# Patient Record
Sex: Male | Born: 1935 | Race: White | Hispanic: No | Marital: Married | Smoking: Former smoker
Health system: Southern US, Community
[De-identification: ages and names within clinical notes are randomized; demographics above are authoritative.]

## PROBLEM LIST (undated history)

## (undated) DIAGNOSIS — I10 Essential (primary) hypertension: Secondary | ICD-10-CM

## (undated) DIAGNOSIS — Z8546 Personal history of malignant neoplasm of prostate: Secondary | ICD-10-CM

## (undated) DIAGNOSIS — C61 Malignant neoplasm of prostate: Secondary | ICD-10-CM

## (undated) DIAGNOSIS — B029 Zoster without complications: Secondary | ICD-10-CM

## (undated) DIAGNOSIS — L608 Other nail disorders: Secondary | ICD-10-CM

## (undated) HISTORY — DX: Malignant neoplasm of prostate: C61

## (undated) HISTORY — DX: Essential (primary) hypertension: I10

## (undated) HISTORY — DX: Zoster without complications: B02.9

## (undated) HISTORY — PX: ABDOMINAL SURGERY: SHX537

## (undated) HISTORY — DX: Personal history of malignant neoplasm of prostate: Z85.46

---

## 1975-08-04 DIAGNOSIS — G4730 Sleep apnea, unspecified: Secondary | ICD-10-CM

## 1975-08-04 HISTORY — DX: Sleep apnea, unspecified: G47.30

## 2004-06-21 DIAGNOSIS — C61 Malignant neoplasm of prostate: Secondary | ICD-10-CM

## 2004-06-21 HISTORY — DX: Malignant neoplasm of prostate: C61

## 2004-08-03 HISTORY — PX: OTHER SURGICAL HISTORY: SHX169

## 2005-09-11 ENCOUNTER — Emergency Department: Payer: Self-pay | Admitting: Emergency Medicine

## 2005-09-11 ENCOUNTER — Other Ambulatory Visit: Payer: Self-pay

## 2006-04-03 ENCOUNTER — Encounter: Payer: Self-pay | Admitting: Family Medicine

## 2006-04-03 LAB — CONVERTED CEMR LAB: PSA: 0 ng/mL

## 2006-09-27 ENCOUNTER — Ambulatory Visit: Payer: Self-pay | Admitting: Family Medicine

## 2006-10-13 ENCOUNTER — Ambulatory Visit: Payer: Self-pay | Admitting: Pulmonary Disease

## 2007-06-13 ENCOUNTER — Ambulatory Visit: Payer: Self-pay | Admitting: Family Medicine

## 2007-06-13 LAB — CONVERTED CEMR LAB
Blood in Urine, dipstick: NEGATIVE
Nitrite: NEGATIVE
Specific Gravity, Urine: <1.005
WBC Urine, dipstick: NEGATIVE
pH: 6

## 2007-06-22 ENCOUNTER — Encounter: Payer: Self-pay | Admitting: Family Medicine

## 2007-06-22 DIAGNOSIS — C61 Malignant neoplasm of prostate: Secondary | ICD-10-CM | POA: Insufficient documentation

## 2007-06-22 DIAGNOSIS — E669 Obesity, unspecified: Secondary | ICD-10-CM | POA: Insufficient documentation

## 2007-06-22 DIAGNOSIS — K5730 Diverticulosis of large intestine without perforation or abscess without bleeding: Secondary | ICD-10-CM | POA: Insufficient documentation

## 2007-06-22 DIAGNOSIS — Z8719 Personal history of other diseases of the digestive system: Secondary | ICD-10-CM

## 2007-06-22 DIAGNOSIS — G4733 Obstructive sleep apnea (adult) (pediatric): Secondary | ICD-10-CM

## 2007-06-22 HISTORY — DX: Obstructive sleep apnea (adult) (pediatric): G47.33

## 2007-06-23 ENCOUNTER — Ambulatory Visit: Payer: Self-pay | Admitting: Family Medicine

## 2007-06-27 DIAGNOSIS — R7309 Other abnormal glucose: Secondary | ICD-10-CM

## 2007-06-27 DIAGNOSIS — E8881 Metabolic syndrome: Secondary | ICD-10-CM | POA: Insufficient documentation

## 2007-06-27 LAB — CONVERTED CEMR LAB
AST: 28 units/L (ref 0–37)
Basophils Absolute: 0 10*3/uL (ref 0.0–0.1)
Bilirubin, Direct: 0.1 mg/dL (ref 0.0–0.3)
CO2: 30 meq/L (ref 19–32)
Creatinine, Ser: 1.2 mg/dL (ref 0.4–1.5)
Direct LDL: 152.3 mg/dL
Eosinophils Relative: 1.6 % (ref 0.0–5.0)
Glucose, Bld: 109 mg/dL — ABNORMAL HIGH (ref 70–99)
HCT: 42.5 % (ref 39.0–52.0)
Hemoglobin: 14.6 g/dL (ref 13.0–17.0)
MCHC: 34.2 g/dL (ref 30.0–36.0)
Monocytes Absolute: 0.5 10*3/uL (ref 0.2–0.7)
Neutrophils Relative %: 70.4 % (ref 43.0–77.0)
Potassium: 4.7 meq/L (ref 3.5–5.1)
RDW: 12.9 % (ref 11.5–14.6)
Sodium: 143 meq/L (ref 135–145)
TSH: 3.25 microintl units/mL (ref 0.35–5.50)
Total Bilirubin: 0.9 mg/dL (ref 0.3–1.2)
Total CHOL/HDL Ratio: 4.3
Total Protein: 7.5 g/dL (ref 6.0–8.3)
WBC: 5.4 10*3/uL (ref 4.5–10.5)

## 2007-07-07 ENCOUNTER — Ambulatory Visit: Payer: Self-pay

## 2007-07-07 ENCOUNTER — Ambulatory Visit: Payer: Self-pay | Admitting: Cardiovascular Disease

## 2007-07-13 ENCOUNTER — Encounter: Payer: Self-pay | Admitting: Family Medicine

## 2007-07-13 ENCOUNTER — Ambulatory Visit: Payer: Self-pay

## 2007-07-25 ENCOUNTER — Ambulatory Visit: Payer: Self-pay | Admitting: Family Medicine

## 2007-07-25 DIAGNOSIS — F528 Other sexual dysfunction not due to a substance or known physiological condition: Secondary | ICD-10-CM

## 2007-10-26 ENCOUNTER — Ambulatory Visit: Payer: Self-pay | Admitting: Family Medicine

## 2007-11-03 ENCOUNTER — Ambulatory Visit: Payer: Self-pay | Admitting: Family Medicine

## 2007-11-09 LAB — CONVERTED CEMR LAB
BUN: 10 mg/dL (ref 6–23)
Calcium: 8.8 mg/dL (ref 8.4–10.5)
Creatinine, Ser: 1.1 mg/dL (ref 0.4–1.5)
GFR calc non Af Amer: 69 mL/min

## 2007-12-07 ENCOUNTER — Ambulatory Visit: Payer: Self-pay | Admitting: Family Medicine

## 2007-12-08 LAB — CONVERTED CEMR LAB
ALT: 23 units/L (ref 0–53)
AST: 23 units/L (ref 0–37)
Albumin: 4.6 g/dL (ref 3.5–5.2)
Alkaline Phosphatase: 48 units/L (ref 39–117)
BUN: 7 mg/dL (ref 6–23)
CO2: 31 meq/L (ref 19–32)
Chloride: 107 meq/L (ref 96–112)
Cholesterol: 217 mg/dL (ref 0–200)
Creatinine, Ser: 1.1 mg/dL (ref 0.4–1.5)
Direct LDL: 148.9 mg/dL
GFR calc non Af Amer: 69 mL/min
Potassium: 4.2 meq/L (ref 3.5–5.1)
Total Bilirubin: 1.1 mg/dL (ref 0.3–1.2)

## 2008-03-05 ENCOUNTER — Ambulatory Visit: Payer: Self-pay | Admitting: Family Medicine

## 2008-03-08 LAB — CONVERTED CEMR LAB
HDL: 44.8 mg/dL (ref >39.0)
LDL Cholesterol: 134 mg/dL — ABNORMAL HIGH (ref 0–99)
Total CHOL/HDL Ratio: 4.4
Triglycerides: 89 mg/dL (ref 0–149)

## 2008-09-11 ENCOUNTER — Ambulatory Visit: Payer: Self-pay | Admitting: Family Medicine

## 2008-09-14 LAB — CONVERTED CEMR LAB
Cholesterol: 199 mg/dL (ref 0–200)
VLDL: 18 mg/dL (ref 0–40)

## 2009-08-15 ENCOUNTER — Ambulatory Visit: Payer: Self-pay | Admitting: Family Medicine

## 2009-08-26 ENCOUNTER — Ambulatory Visit: Payer: Self-pay | Admitting: Family Medicine

## 2009-08-26 DIAGNOSIS — R5383 Other fatigue: Secondary | ICD-10-CM

## 2009-08-26 DIAGNOSIS — R5381 Other malaise: Secondary | ICD-10-CM

## 2009-08-29 LAB — CONVERTED CEMR LAB
AST: 23 units/L (ref 0–37)
Albumin: 4.1 g/dL (ref 3.5–5.2)
BUN: 16 mg/dL (ref 6–23)
Basophils Absolute: 0 10*3/uL (ref 0.0–0.1)
CO2: 30 meq/L (ref 19–32)
Cholesterol: 191 mg/dL (ref 0–200)
Eosinophils Absolute: 0.1 10*3/uL (ref 0.0–0.7)
Glucose, Bld: 112 mg/dL — ABNORMAL HIGH (ref 70–99)
HCT: 41.7 % (ref 39.0–52.0)
HDL: 44.7 mg/dL (ref >39.00)
Hemoglobin: 14 g/dL (ref 13.0–17.0)
Lymphs Abs: 1.1 10*3/uL (ref 0.7–4.0)
MCHC: 33.7 g/dL (ref 30.0–36.0)
MCV: 96.7 fL (ref 78.0–100.0)
Monocytes Absolute: 0.5 10*3/uL (ref 0.1–1.0)
Neutro Abs: 5.2 10*3/uL (ref 1.4–7.7)
PSA: 0.32 ng/mL (ref 0.10–4.00)
Platelets: 191 10*3/uL (ref 150.0–400.0)
Potassium: 4.7 meq/L (ref 3.5–5.1)
RDW: 13 % (ref 11.5–14.6)
Sodium: 139 meq/L (ref 135–145)
TSH: 4.69 microintl units/mL (ref 0.35–5.50)
Total Bilirubin: 0.9 mg/dL (ref 0.3–1.2)
VLDL: 19 mg/dL (ref 0.0–40.0)

## 2009-09-03 ENCOUNTER — Ambulatory Visit: Payer: Self-pay | Admitting: Cardiovascular Disease

## 2009-09-03 ENCOUNTER — Encounter (INDEPENDENT_AMBULATORY_CARE_PROVIDER_SITE_OTHER): Payer: Self-pay

## 2009-09-03 ENCOUNTER — Ambulatory Visit: Payer: Self-pay

## 2009-09-03 ENCOUNTER — Encounter: Payer: Self-pay | Admitting: Family Medicine

## 2009-09-03 ENCOUNTER — Ambulatory Visit (HOSPITAL_COMMUNITY): Admission: RE | Admit: 2009-09-03 | Discharge: 2009-09-03 | Payer: Self-pay | Admitting: Family Medicine

## 2009-09-05 ENCOUNTER — Ambulatory Visit: Payer: Self-pay | Admitting: Family Medicine

## 2009-09-06 DIAGNOSIS — I10 Essential (primary) hypertension: Secondary | ICD-10-CM | POA: Insufficient documentation

## 2009-11-11 ENCOUNTER — Ambulatory Visit: Payer: Self-pay | Admitting: Family Medicine

## 2009-11-11 DIAGNOSIS — R109 Unspecified abdominal pain: Secondary | ICD-10-CM | POA: Insufficient documentation

## 2010-01-20 ENCOUNTER — Ambulatory Visit: Payer: Self-pay | Admitting: Family Medicine
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
## 2010-09-02 NOTE — Assessment & Plan Note (Signed)
Summary: FOLLOW UP / LFW   Vital Signs:  Patient profile:   77 year old male Height:      68 inches Weight:      250.6 pounds BMI:     38.24 Temp:     98.0 degrees F oral Pulse rate:   72 / minute BP sitting:   150 / 70  (left arm) Cuff size:   large  Vitals Entered By: Benny Lennert CMA Duncan Dull) (January 20, 2010 10:49 AM)  History of Present Illness: Chief complaint 2 month follow up  HTN: 150/70  checks bp daily, still high  ROS: no fever, nausea, hurt back playing golf  GEN: WDWN, NAD, Non-toxic, A & O x 3 HEENT: Atraumatic, Normocephalic. Neck supple. No masses, No LAD. Ears and Nose: No external deformity. CV: RRR, No M/G/R. No JVD. No thrill. No extra heart sounds. NEURO: Normal gait.  PSYCH: Normally interactive. Conversant. Not depressed or anxious appearing.  Calm demeanor.    Allergies (verified): No Known Drug Allergies   Impression & Recommendations:  Problem # 1:  HYPERTENSION (ICD-401.9) Assessment Deteriorated  The following medications were removed from the medication list:    Amlodipine Besylate 10 Mg Tabs (Amlodipine besylate) .[name]... 1 by mouth daily His updated medication list for this problem includes:    Metoprolol Succinate 50 Mg Xr24h-tab (Metoprolol succinate) .[name]... 1 by mouth daily  BP today: 150/70 Prior BP: 116/80 (11/11/2009)  Prior 10 Yr Risk Heart Disease: Not enough information (07/25/2007)  Labs Reviewed: K+: 4.7 (08/26/2009) Creat: : 1.3 (08/26/2009)   Chol: 191 (08/26/2009)   HDL: 44.70 (08/26/2009)   LDL: 127 (08/26/2009)   TG: 95.0 (08/26/2009)  Orders: Prescription Created Electronically [id number])  Complete Medication List: 1)  Metoprolol Succinate 50 Mg Xr24h-tab (Metoprolol succinate) .[name].. 1 by mouth daily Prescriptions: METOPROLOL SUCCINATE 50 MG XR24H-TAB (METOPROLOL SUCCINATE) 1 by mouth daily  #30 x 11   Entered and Authorized by:   Hannah Beat MD   Signed by:   Hannah Beat MD on 01/20/2010   Method  used:   Electronically to        [facility]  [address] (retail)       [address]       [place], [place]  [address]       Ph: [phone] or [phone]       Fax: [phone]   RxID:   [id number]   Current Allergies (reviewed today): No known allergies

## 2010-09-02 NOTE — Miscellaneous (Signed)
Summary: IV For Definity Echo  Clinical Lists Changes     IV 0.9 % NACL 22G angiocath (R) AC for Definity Echo P. Undra Trembath,RN.

## 2010-12-16 NOTE — Procedures (Signed)
[facility]                              EXERCISE TREADMILL   VERLIN, UHER                       MRN:          [id number]  DATE:07/07/2007                            DOB:          [birth year]    REASON FOR REFERRAL:  Mr. Kloepfer is a 76 year old patient referred by  Dr. Ermalene Searing for cardiac risk factors for stress test.  He has  significant hypertension, hypercholesterolemia.   The patient exercised 9 minutes on a standard Bruce protocol.  Exercise  was stopped due to fatigue and shortness of breath.  Maximum heart rate  was 151.  Peak blood pressure was 207/87.  Resting EKG was normal.  With  stress, there was 1 mm of ST segment depression in the inferolateral  leads.   IMPRESSION:  Positive exercise treadmill test with electrocardiogram  changes and significant hypertension.   PLAN:  1. The patient will be referred for a stress Myoview.  2. Given his significant response in blood pressure to exercise, I      suspect that he would be better served with a medication such as      Hyzaar 100/12.5 or 100/25 rather than just a diuretic.  3. He will follow up with Dr. Ermalene Searing after his stress test to further      adjust his blood pressure medications.  4. He will be scheduled to see me if his stress Myoview is abnormal.     Theron Arista C. Eden Emms, MD, [facility]  Electronically Signed    PCN/MedQ  DD: 07/07/2007  DT: 07/07/2007  Job #: [id number]
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
## 2011-08-05 DIAGNOSIS — C61 Malignant neoplasm of prostate: Secondary | ICD-10-CM | POA: Diagnosis not present

## 2011-08-05 DIAGNOSIS — R350 Frequency of micturition: Secondary | ICD-10-CM | POA: Diagnosis not present

## 2011-08-05 DIAGNOSIS — R351 Nocturia: Secondary | ICD-10-CM | POA: Diagnosis not present

## 2011-08-05 DIAGNOSIS — E291 Testicular hypofunction: Secondary | ICD-10-CM | POA: Diagnosis not present

## 2011-08-10 DIAGNOSIS — M999 Biomechanical lesion, unspecified: Secondary | ICD-10-CM | POA: Diagnosis not present

## 2011-08-10 DIAGNOSIS — IMO0002 Reserved for concepts with insufficient information to code with codable children: Secondary | ICD-10-CM | POA: Diagnosis not present

## 2011-08-10 DIAGNOSIS — M5137 Other intervertebral disc degeneration, lumbosacral region: Secondary | ICD-10-CM | POA: Diagnosis not present

## 2011-09-07 DIAGNOSIS — M999 Biomechanical lesion, unspecified: Secondary | ICD-10-CM | POA: Diagnosis not present

## 2011-09-07 DIAGNOSIS — M5137 Other intervertebral disc degeneration, lumbosacral region: Secondary | ICD-10-CM | POA: Diagnosis not present

## 2011-09-07 DIAGNOSIS — IMO0002 Reserved for concepts with insufficient information to code with codable children: Secondary | ICD-10-CM | POA: Diagnosis not present

## 2011-10-05 DIAGNOSIS — IMO0002 Reserved for concepts with insufficient information to code with codable children: Secondary | ICD-10-CM | POA: Diagnosis not present

## 2011-10-05 DIAGNOSIS — M999 Biomechanical lesion, unspecified: Secondary | ICD-10-CM | POA: Diagnosis not present

## 2011-10-05 DIAGNOSIS — M5137 Other intervertebral disc degeneration, lumbosacral region: Secondary | ICD-10-CM | POA: Diagnosis not present

## 2011-10-09 DIAGNOSIS — H2510 Age-related nuclear cataract, unspecified eye: Secondary | ICD-10-CM | POA: Diagnosis not present

## 2011-11-04 DIAGNOSIS — IMO0002 Reserved for concepts with insufficient information to code with codable children: Secondary | ICD-10-CM | POA: Diagnosis not present

## 2011-11-04 DIAGNOSIS — M5137 Other intervertebral disc degeneration, lumbosacral region: Secondary | ICD-10-CM | POA: Diagnosis not present

## 2011-11-04 DIAGNOSIS — M999 Biomechanical lesion, unspecified: Secondary | ICD-10-CM | POA: Diagnosis not present

## 2011-12-02 DIAGNOSIS — M999 Biomechanical lesion, unspecified: Secondary | ICD-10-CM | POA: Diagnosis not present

## 2011-12-02 DIAGNOSIS — IMO0002 Reserved for concepts with insufficient information to code with codable children: Secondary | ICD-10-CM | POA: Diagnosis not present

## 2011-12-02 DIAGNOSIS — M5137 Other intervertebral disc degeneration, lumbosacral region: Secondary | ICD-10-CM | POA: Diagnosis not present

## 2011-12-18 DIAGNOSIS — C61 Malignant neoplasm of prostate: Secondary | ICD-10-CM | POA: Diagnosis not present

## 2011-12-18 DIAGNOSIS — E291 Testicular hypofunction: Secondary | ICD-10-CM | POA: Diagnosis not present

## 2011-12-18 DIAGNOSIS — R351 Nocturia: Secondary | ICD-10-CM | POA: Diagnosis not present

## 2012-01-06 DIAGNOSIS — M5137 Other intervertebral disc degeneration, lumbosacral region: Secondary | ICD-10-CM | POA: Diagnosis not present

## 2012-01-06 DIAGNOSIS — M999 Biomechanical lesion, unspecified: Secondary | ICD-10-CM | POA: Diagnosis not present

## 2012-01-06 DIAGNOSIS — IMO0002 Reserved for concepts with insufficient information to code with codable children: Secondary | ICD-10-CM | POA: Diagnosis not present

## 2012-02-10 DIAGNOSIS — M5137 Other intervertebral disc degeneration, lumbosacral region: Secondary | ICD-10-CM | POA: Diagnosis not present

## 2012-02-10 DIAGNOSIS — IMO0002 Reserved for concepts with insufficient information to code with codable children: Secondary | ICD-10-CM | POA: Diagnosis not present

## 2012-02-10 DIAGNOSIS — M999 Biomechanical lesion, unspecified: Secondary | ICD-10-CM | POA: Diagnosis not present

## 2012-03-08 ENCOUNTER — Emergency Department: Payer: Self-pay | Admitting: Emergency Medicine

## 2012-03-08 DIAGNOSIS — R079 Chest pain, unspecified: Secondary | ICD-10-CM | POA: Diagnosis not present

## 2012-03-08 DIAGNOSIS — Z8546 Personal history of malignant neoplasm of prostate: Secondary | ICD-10-CM | POA: Diagnosis not present

## 2012-03-08 LAB — CBC
HGB: 16.7 g/dL (ref 13.0–18.0)
MCH: 32.4 pg (ref 26.0–34.0)
MCHC: 33.9 g/dL (ref 32.0–36.0)
MCV: 96 fL (ref 80–100)
Platelet: 198 10*3/uL (ref 150–440)
RDW: 13.8 % (ref 11.5–14.5)

## 2012-03-08 LAB — COMPREHENSIVE METABOLIC PANEL
Albumin: 4.2 g/dL (ref 3.4–5.0)
Anion Gap: 9 (ref 7–16)
Calcium, Total: 9 mg/dL (ref 8.5–10.1)
Chloride: 103 mmol/L (ref 98–107)
Co2: 25 mmol/L (ref 21–32)
EGFR (African American): >60
Glucose: 97 mg/dL (ref 65–99)
Potassium: 5.8 mmol/L — ABNORMAL HIGH (ref 3.5–5.1)
SGOT(AST): 62 U/L — ABNORMAL HIGH (ref 15–37)

## 2012-03-09 ENCOUNTER — Telehealth: Payer: Self-pay

## 2012-03-09 DIAGNOSIS — M5137 Other intervertebral disc degeneration, lumbosacral region: Secondary | ICD-10-CM | POA: Diagnosis not present

## 2012-03-09 DIAGNOSIS — M999 Biomechanical lesion, unspecified: Secondary | ICD-10-CM | POA: Diagnosis not present

## 2012-03-09 DIAGNOSIS — IMO0002 Reserved for concepts with insufficient information to code with codable children: Secondary | ICD-10-CM | POA: Diagnosis not present

## 2012-03-09 NOTE — Telephone Encounter (Signed)
Message copied by Marcelle Overlie on Wed Mar 09, 2012  8:36 AM ------      Message from: Lorine Bears A      Created: Tue Mar 08, 2012  8:24 PM      Regarding: Follow up        He was in ER for chest pain and hypertension. Schedule him to see me early next week.

## 2012-03-09 NOTE — Telephone Encounter (Signed)
I called and spoke with pt. He says he is feeling better and is willing to schedule appt with Dr. Kirke Corin next Thursday at 0900 (03/17/12).

## 2012-03-17 ENCOUNTER — Encounter: Payer: Self-pay | Admitting: Cardiovascular Disease

## 2012-03-17 ENCOUNTER — Encounter: Payer: Self-pay | Admitting: *Deleted

## 2012-03-17 ENCOUNTER — Ambulatory Visit (INDEPENDENT_AMBULATORY_CARE_PROVIDER_SITE_OTHER): Payer: BC Managed Care – PPO | Admitting: Cardiovascular Disease

## 2012-03-17 VITALS — BP 168/84 | HR 59 | Ht 68.0 in | Wt 244.5 lb

## 2012-03-17 DIAGNOSIS — I10 Essential (primary) hypertension: Secondary | ICD-10-CM

## 2012-03-17 DIAGNOSIS — R0789 Other chest pain: Secondary | ICD-10-CM | POA: Diagnosis not present

## 2012-03-17 MED ORDER — CARVEDILOL 6.25 MG PO TABS: 6.2500 mg | ORAL_TABLET | Freq: Two times a day (BID) | ORAL | Status: DC

## 2012-03-17 MED ORDER — AMLODIPINE BESYLATE 5 MG PO TABS: 5.0000 mg | ORAL_TABLET | Freq: Every day | ORAL | Status: DC

## 2012-03-17 NOTE — Patient Instructions (Addendum)
Your physician has requested that you have en exercise stress myoview on august 20,2013   Your physician has recommended you make the following change in your medication:  Start Amlodipine 5 mg daily  Your physician recommends that you schedule a follow-up appointment 2 weeks after your myoview.

## 2012-03-18 ENCOUNTER — Encounter: Payer: Self-pay | Admitting: Cardiovascular Disease

## 2012-03-18 DIAGNOSIS — R0789 Other chest pain: Secondary | ICD-10-CM | POA: Insufficient documentation

## 2012-03-18 NOTE — Progress Notes (Signed)
HPI  This is a 78 year old male who is here today for cardiovascular evaluation. He was referred from the emergency room at [facility]. The patient has prolonged history of hypertension for which the patient has refused treatment in the past as he felt that the antihypertensive medications were making him feel tired. As a matter of fact he has not seen his primary care physician Dr. Patsy Lager in few years. He reports no previous cardiac history. He did have chest pain 3 years ago and was evaluated by a stress test and echocardiogram. Both of them were unremarkable. He never had cardiac catheterization in the past. He has been having recurrent episodes of substernal and right-sided chest discomfort radiating to his right arm mostly at rest and associated with numbness in the right arm. He complains of mild exertional dyspnea. He had one severe episode last week on Wednesday. He went to the emergency room at [facility]. He was noted to be hypertensive. His labs were unremarkable. CT scan showed no evidence of aortic dissection. He did not want to be admitted and was discharged home from the emergency room. He was discharged home on carvedilol 6.25 mg twice daily. He also has been taking aspirin 81 mg daily. He seems to be tolerating this blood pressure medication. He had few minor episodes of chest pain since then.  No Known Allergies   Current Outpatient Prescriptions on File Prior to Visit  Medication Sig Dispense Refill  . carvedilol (COREG) 6.25 MG tablet Take 1 tablet (6.25 mg total) by mouth 2 (two) times daily with a meal.  60 tablet  6  . Testosterone (AXIRON) 30 MG/ACT SOLN Place onto the skin.      [name] amLODipine (NORVASC) 5 MG tablet Take 1 tablet (5 mg total) by mouth daily.  30 tablet  6     Past Medical History  Diagnosis Date  . Hypertension   . H/O prostate cancer     was treated with radiation     History reviewed. No pertinent past surgical history.   Family History  Problem Relation  Age of Onset  . Heart disease Brother   . Heart attack Brother      History   Social History  . Marital Status: Married    Spouse Name: N/A    Number of Children: N/A  . Years of Education: N/A   Occupational History  . Not on file.   Social History Main Topics  . Smoking status: Former Smoker -- 1.0 packs/day for 15 years    Types: Cigarettes    Quit date: 09/14/1965  . Smokeless tobacco: Not on file  . Alcohol Use: 3.5 oz/week    7 drink(s) per week  . Drug Use: No  . Sexually Active: Not on file   Other Topics Concern  . Not on file   Social History Narrative  . No narrative on file     ROS Constitutional: Negative for fever, chills, diaphoresis, activity change, appetite change and fatigue.  HENT: Negative for hearing loss, nosebleeds, congestion, sore throat, facial swelling, drooling, trouble swallowing, neck pain, voice change, sinus pressure and tinnitus.  Eyes: Negative for photophobia, pain, discharge and visual disturbance.  Respiratory: Negative for apnea, cough and wheezing.  Cardiovascular: Negative for  palpitations and leg swelling.  Gastrointestinal: Negative for nausea, vomiting, abdominal pain, diarrhea, constipation, blood in stool and abdominal distention.  Genitourinary: Negative for dysuria, urgency, frequency, hematuria and decreased urine volume.  Musculoskeletal: Negative for myalgias, back pain, joint  swelling, arthralgias and gait problem.  Skin: Negative for color change, pallor, rash and wound.  Neurological: Negative for dizziness, tremors, seizures, syncope, speech difficulty, weakness, light-headedness, numbness and headaches.  Psychiatric/Behavioral: Negative for suicidal ideas, hallucinations, behavioral problems and agitation. The patient is not nervous/anxious.     PHYSICAL EXAM   BP 168/84  Pulse 59  Ht 5\' 8"  (1.727 m)  Wt 244 lb 8 oz (110.904 kg)  BMI 37.18 kg/m2 Constitutional: He is oriented to person, place, and time.  He appears well-developed and well-nourished. No distress.  HENT: No nasal discharge.  Head: Normocephalic and atraumatic.  Eyes: Pupils are equal and round. Right eye exhibits no discharge. Left eye exhibits no discharge.  Neck: Normal range of motion. Neck supple. No JVD present. No thyromegaly present.  Cardiovascular: Normal rate, regular rhythm, normal heart sounds and. Exam reveals no gallop and no friction rub. No murmur heard.  Pulmonary/Chest: Effort normal and breath sounds normal. No stridor. No respiratory distress. He has no wheezes. He has no rales. He exhibits no tenderness.  Abdominal: Soft. Bowel sounds are normal. He exhibits no distension. There is no tenderness. There is no rebound and no guarding.  Musculoskeletal: Normal range of motion. He exhibits no edema and no tenderness.  Neurological: He is alert and oriented to person, place, and time. Coordination normal.  Skin: Skin is warm and dry. No rash noted. He is not diaphoretic. No erythema. No pallor.  Psychiatric: He has a normal mood and affect. His behavior is normal. Judgment and thought content normal.       EKG: Sinus  Bradycardia  WITHIN NORMAL LIMITS   ASSESSMENT AND PLAN

## 2012-03-18 NOTE — Assessment & Plan Note (Signed)
I had a prolonged discussion with him about the importance of controlling his hypertension. He seems to be tolerating carvedilol but the dose cannot be increased as his heart rate is somewhat on the low side. Thus, I will add amlodipine 5 mg once daily.

## 2012-03-18 NOTE — Assessment & Plan Note (Addendum)
His chest pain is overall atypical but warrants further ischemic cardiac evaluation given his risk factors for coronary artery disease and uncontrolled hypertension. Thus, I recommend proceeding with a treadmill nuclear stress test for further evaluation.

## 2012-03-22 ENCOUNTER — Ambulatory Visit: Payer: Self-pay | Admitting: Cardiovascular Disease

## 2012-03-22 DIAGNOSIS — R079 Chest pain, unspecified: Secondary | ICD-10-CM

## 2012-03-31 ENCOUNTER — Other Ambulatory Visit: Payer: Self-pay | Admitting: Cardiovascular Disease

## 2012-03-31 DIAGNOSIS — R0789 Other chest pain: Secondary | ICD-10-CM

## 2012-04-06 DIAGNOSIS — M5137 Other intervertebral disc degeneration, lumbosacral region: Secondary | ICD-10-CM | POA: Diagnosis not present

## 2012-04-06 DIAGNOSIS — M999 Biomechanical lesion, unspecified: Secondary | ICD-10-CM | POA: Diagnosis not present

## 2012-04-06 DIAGNOSIS — IMO0002 Reserved for concepts with insufficient information to code with codable children: Secondary | ICD-10-CM | POA: Diagnosis not present

## 2012-04-08 ENCOUNTER — Encounter: Payer: Self-pay | Admitting: Cardiovascular Disease

## 2012-04-08 ENCOUNTER — Ambulatory Visit (INDEPENDENT_AMBULATORY_CARE_PROVIDER_SITE_OTHER): Payer: Medicare Other | Admitting: Cardiovascular Disease

## 2012-04-08 VITALS — BP 160/70 | HR 56 | Ht 68.0 in | Wt 242.8 lb

## 2012-04-08 DIAGNOSIS — R0789 Other chest pain: Secondary | ICD-10-CM

## 2012-04-08 DIAGNOSIS — I10 Essential (primary) hypertension: Secondary | ICD-10-CM

## 2012-04-08 NOTE — Assessment & Plan Note (Signed)
His blood pressure is improved but still elevated. I had a prolonged discussion with him about the importance of controlling his blood pressure. He is hesitant to add any other blood pressure medication and does not want to do that at this time. I don't want to increase his amlodipine due to lower extremity edema. The next step would be either to add an ACE inhibitor/ARB or thiazide diuretic. The patient wants to start an exercise program and try to lose weight. I asked him to monitor his blood pressure at home. He is to followup in 6 months or earlier if needed.

## 2012-04-08 NOTE — Patient Instructions (Addendum)
Continue same medications.  Monitor your blood pressure at least 3 times/ week.  Follow up in 6 months.

## 2012-04-08 NOTE — Assessment & Plan Note (Signed)
Likely noncardiac. Nuclear stress test was normal. He has symptoms suggestive of GERD but overall he feels better. He reports prolonged history of recurrent heartburn. I advised him to seek GI evaluation as EGD might be indicated due to his prolonged symptoms.

## 2012-04-08 NOTE — Progress Notes (Signed)
HPI  This is a 78 year old male who is here today for a followup visit.  The patient has prolonged history of hypertension for which the patient has refused treatment in the past as he felt that the antihypertensive medications were making him feel tired. He reports no previous cardiac history. He did have chest pain 3 years ago and was evaluated by a stress test and echocardiogram. Both of them were unremarkable. He never had cardiac catheterization in the past. He was seen for recurrent episodes of substernal and right-sided chest discomfort radiating to his right arm mostly at rest and associated with numbness in the right arm. He was evaluated at the emergency department at [facility] where He was noted to be hypertensive. His labs were unremarkable. CT scan showed no evidence of aortic dissection. After consultation with the ED physician, he was discharged home on carvedilol 6.25 mg twice daily. When he came to see me for the first time, his blood pressure was very elevated. I added amlodipine 5 mg once daily and scheduled him for a treadmill nuclear stress test which showed no evidence of ischemia or infarcts with normal ejection fraction. He had no ischemic ECG changes on treadmill stress test but was noted to have hypertensive response with systolic blood pressure close to 240 systolic. Overall he feels better. He is tolerating his medications. His chest pain is much less now. He thinks it might be related to GERD.  No Known Allergies   Current Outpatient Prescriptions on File Prior to Visit  Medication Sig Dispense Refill  . amLODipine (NORVASC) 5 MG tablet Take 1 tablet (5 mg total) by mouth daily.  30 tablet  6  . aspirin 81 MG tablet Take 81 mg by mouth daily.      . carvedilol (COREG) 6.25 MG tablet Take 1 tablet (6.25 mg total) by mouth 2 (two) times daily with a meal.  60 tablet  6  . Testosterone (AXIRON) 30 MG/ACT SOLN Place onto the skin.         Past Medical History  Diagnosis Date   . Hypertension   . H/O prostate cancer     was treated with radiation     History reviewed. No pertinent past surgical history.   Family History  Problem Relation Age of Onset  . Heart disease Brother   . Heart attack Brother      History   Social History  . Marital Status: Married    Spouse Name: N/A    Number of Children: N/A  . Years of Education: N/A   Occupational History  . Not on file.   Social History Main Topics  . Smoking status: Former Smoker -- 1.0 packs/day for 15 years    Types: Cigarettes    Quit date: 09/14/1965  . Smokeless tobacco: Not on file  . Alcohol Use: 3.5 oz/week    7 drink(s) per week  . Drug Use: No  . Sexually Active: Not on file   Other Topics Concern  . Not on file   Social History Narrative  . No narrative on file     PHYSICAL EXAM   BP 160/70  Pulse 56  Ht 5\' 8"  (1.727 m)  Wt 242 lb 12 oz (110.111 kg)  BMI 36.91 kg/m2 Constitutional: He is oriented to person, place, and time. He appears well-developed and well-nourished. No distress.  HENT: No nasal discharge.  Head: Normocephalic and atraumatic.  Eyes: Pupils are equal and round. Right eye exhibits no discharge.  Left eye exhibits no discharge.  Neck: Normal range of motion. Neck supple. No JVD present. No thyromegaly present.  Cardiovascular: Normal rate, regular rhythm, normal heart sounds and. Exam reveals no gallop and no friction rub. No murmur heard.  Pulmonary/Chest: Effort normal and breath sounds normal. No stridor. No respiratory distress. He has no wheezes. He has no rales. He exhibits no tenderness.  Abdominal: Soft. Bowel sounds are normal. He exhibits no distension. There is no tenderness. There is no rebound and no guarding.  Musculoskeletal: Normal range of motion. He exhibits +1 edema and no tenderness.  Neurological: He is alert and oriented to person, place, and time. Coordination normal.  Skin: Skin is warm and dry. No rash noted. He is not  diaphoretic. No erythema. No pallor.  Psychiatric: He has a normal mood and affect. His behavior is normal. Judgment and thought content normal.      ASSESSMENT AND PLAN

## 2012-05-04 DIAGNOSIS — M999 Biomechanical lesion, unspecified: Secondary | ICD-10-CM | POA: Diagnosis not present

## 2012-05-04 DIAGNOSIS — M5137 Other intervertebral disc degeneration, lumbosacral region: Secondary | ICD-10-CM | POA: Diagnosis not present

## 2012-05-04 DIAGNOSIS — IMO0002 Reserved for concepts with insufficient information to code with codable children: Secondary | ICD-10-CM | POA: Diagnosis not present

## 2012-05-19 DIAGNOSIS — R351 Nocturia: Secondary | ICD-10-CM | POA: Diagnosis not present

## 2012-05-19 DIAGNOSIS — C61 Malignant neoplasm of prostate: Secondary | ICD-10-CM | POA: Diagnosis not present

## 2012-05-19 DIAGNOSIS — E291 Testicular hypofunction: Secondary | ICD-10-CM | POA: Diagnosis not present

## 2012-06-08 DIAGNOSIS — M999 Biomechanical lesion, unspecified: Secondary | ICD-10-CM | POA: Diagnosis not present

## 2012-06-08 DIAGNOSIS — IMO0002 Reserved for concepts with insufficient information to code with codable children: Secondary | ICD-10-CM | POA: Diagnosis not present

## 2012-06-08 DIAGNOSIS — M5137 Other intervertebral disc degeneration, lumbosacral region: Secondary | ICD-10-CM | POA: Diagnosis not present

## 2012-07-06 DIAGNOSIS — IMO0002 Reserved for concepts with insufficient information to code with codable children: Secondary | ICD-10-CM | POA: Diagnosis not present

## 2012-07-06 DIAGNOSIS — M5137 Other intervertebral disc degeneration, lumbosacral region: Secondary | ICD-10-CM | POA: Diagnosis not present

## 2012-07-06 DIAGNOSIS — M999 Biomechanical lesion, unspecified: Secondary | ICD-10-CM | POA: Diagnosis not present

## 2012-08-10 DIAGNOSIS — M999 Biomechanical lesion, unspecified: Secondary | ICD-10-CM | POA: Diagnosis not present

## 2012-08-10 DIAGNOSIS — IMO0002 Reserved for concepts with insufficient information to code with codable children: Secondary | ICD-10-CM | POA: Diagnosis not present

## 2012-08-10 DIAGNOSIS — M5137 Other intervertebral disc degeneration, lumbosacral region: Secondary | ICD-10-CM | POA: Diagnosis not present

## 2012-09-07 DIAGNOSIS — M5137 Other intervertebral disc degeneration, lumbosacral region: Secondary | ICD-10-CM | POA: Diagnosis not present

## 2012-09-07 DIAGNOSIS — M999 Biomechanical lesion, unspecified: Secondary | ICD-10-CM | POA: Diagnosis not present

## 2012-09-07 DIAGNOSIS — IMO0002 Reserved for concepts with insufficient information to code with codable children: Secondary | ICD-10-CM | POA: Diagnosis not present

## 2012-10-05 DIAGNOSIS — IMO0002 Reserved for concepts with insufficient information to code with codable children: Secondary | ICD-10-CM | POA: Diagnosis not present

## 2012-10-05 DIAGNOSIS — M5137 Other intervertebral disc degeneration, lumbosacral region: Secondary | ICD-10-CM | POA: Diagnosis not present

## 2012-10-05 DIAGNOSIS — M999 Biomechanical lesion, unspecified: Secondary | ICD-10-CM | POA: Diagnosis not present

## 2012-10-18 ENCOUNTER — Other Ambulatory Visit: Payer: Self-pay | Admitting: *Deleted

## 2012-10-18 MED ORDER — CARVEDILOL 6.25 MG PO TABS: 6.2500 mg | ORAL_TABLET | Freq: Two times a day (BID) | ORAL | Status: DC

## 2012-10-27 DIAGNOSIS — C61 Malignant neoplasm of prostate: Secondary | ICD-10-CM | POA: Diagnosis not present

## 2012-10-27 DIAGNOSIS — E291 Testicular hypofunction: Secondary | ICD-10-CM | POA: Diagnosis not present

## 2012-10-27 DIAGNOSIS — R351 Nocturia: Secondary | ICD-10-CM | POA: Diagnosis not present

## 2012-11-02 DIAGNOSIS — M5137 Other intervertebral disc degeneration, lumbosacral region: Secondary | ICD-10-CM | POA: Diagnosis not present

## 2012-11-02 DIAGNOSIS — IMO0002 Reserved for concepts with insufficient information to code with codable children: Secondary | ICD-10-CM | POA: Diagnosis not present

## 2012-11-02 DIAGNOSIS — M999 Biomechanical lesion, unspecified: Secondary | ICD-10-CM | POA: Diagnosis not present

## 2012-11-24 ENCOUNTER — Ambulatory Visit: Payer: BC Managed Care – PPO | Admitting: Cardiovascular Disease

## 2012-11-30 DIAGNOSIS — IMO0002 Reserved for concepts with insufficient information to code with codable children: Secondary | ICD-10-CM | POA: Diagnosis not present

## 2012-11-30 DIAGNOSIS — M999 Biomechanical lesion, unspecified: Secondary | ICD-10-CM | POA: Diagnosis not present

## 2012-11-30 DIAGNOSIS — M5137 Other intervertebral disc degeneration, lumbosacral region: Secondary | ICD-10-CM | POA: Diagnosis not present

## 2012-12-15 ENCOUNTER — Ambulatory Visit (INDEPENDENT_AMBULATORY_CARE_PROVIDER_SITE_OTHER): Payer: BC Managed Care – PPO | Admitting: Cardiovascular Disease

## 2012-12-15 ENCOUNTER — Encounter: Payer: Self-pay | Admitting: Cardiovascular Disease

## 2012-12-15 VITALS — BP 162/88 | HR 52 | Ht 68.0 in | Wt 235.8 lb

## 2012-12-15 DIAGNOSIS — I10 Essential (primary) hypertension: Secondary | ICD-10-CM

## 2012-12-15 DIAGNOSIS — R0789 Other chest pain: Secondary | ICD-10-CM

## 2012-12-15 MED ORDER — AMLODIPINE BESYLATE 5 MG PO TABS: 5.0000 mg | ORAL_TABLET | Freq: Every day | ORAL | Status: DC

## 2012-12-15 MED ORDER — CARVEDILOL 6.25 MG PO TABS: 6.2500 mg | ORAL_TABLET | Freq: Two times a day (BID) | ORAL | Status: DC

## 2012-12-15 NOTE — Assessment & Plan Note (Signed)
I again had a prolonged discussion with him about the importance of taking this seriously. We discussed the importance of taking medications as prescribed. His heart rate seems to be similar to how it has been doing previous visits. Slightly he is taking carvedilol and not Amlodipine.  I gave him a new prescription in both and asked him to do both medications. He will continue to monitor his blood pressure and heart rate at home. He is to notify us if the heart rate is below 50 beats per minute.

## 2012-12-15 NOTE — Assessment & Plan Note (Signed)
No recurrent symptoms and negative ischemic workup so far.

## 2012-12-15 NOTE — Progress Notes (Signed)
HPI  This is a 79 year old male who is here today for a followup visit.  The patient has prolonged history of hypertension with poor adherence to medical therapy. He did have chest pain few years ago and was evaluated by a stress test and echocardiogram. Both of them were unremarkable. He never had cardiac catheterization in the past. He was seen last year for recurrent episodes of substernal and right-sided chest discomfort radiating to his right arm mostly at rest and associated with numbness in the right arm. He was evaluated at the emergency department at [facility] where He was noted to be hypertensive. His labs were unremarkable. CT scan showed no evidence of aortic dissection. He has been treated with carvedilol and amlodipine for hypertension with improvement. He has been trying to exercise more and lost some weight. It seems that he is only taking one antihypertensive medication but it's not clear whether it's amlodipine (because he makes the 2 medications together. He denies any chest pain, dyspnea or palpitations.  No Known Allergies   Current Outpatient Prescriptions on File Prior to Visit  Medication Sig Dispense Refill  . Omega-3 Fatty Acids (FISH OIL) 1200 MG CAPS Take 1,200 mg by mouth daily.      . Testosterone (AXIRON) 30 MG/ACT SOLN Place onto the skin.      [name] aspirin 81 MG tablet Take 81 mg by mouth daily.       No current facility-administered medications on file prior to visit.     Past Medical History  Diagnosis Date  . Hypertension   . H/O prostate cancer     was treated with radiation     History reviewed. No pertinent past surgical history.   Family History  Problem Relation Age of Onset  . Heart disease Brother   . Heart attack Brother      History   Social History  . Marital Status: Married    Spouse Name: N/A    Number of Children: N/A  . Years of Education: N/A   Occupational History  . Not on file.   Social History Main Topics  . Smoking  status: Former Smoker -- 1.00 packs/day for 15 years    Types: Cigarettes    Quit date: 09/14/1965  . Smokeless tobacco: Not on file  . Alcohol Use: 3.5 oz/week    7 drink(s) per week     Comment: daily  . Drug Use: No  . Sexually Active: Not on file   Other Topics Concern  . Not on file   Social History Narrative  . No narrative on file     PHYSICAL EXAM   BP 162/88  Pulse 52  Ht 5\' 8"  (1.727 m)  Wt 235 lb 12 oz (106.935 kg)  BMI 35.85 kg/m2 Constitutional: He is oriented to person, place, and time. He appears well-developed and well-nourished. No distress.  HENT: No nasal discharge.  Head: Normocephalic and atraumatic.  Eyes: Pupils are equal and round. Right eye exhibits no discharge. Left eye exhibits no discharge.  Neck: Normal range of motion. Neck supple. No JVD present. No thyromegaly present.  Cardiovascular: Normal rate, regular rhythm, normal heart sounds and. Exam reveals no gallop and no friction rub. No murmur heard.  Pulmonary/Chest: Effort normal and breath sounds normal. No stridor. No respiratory distress. He has no wheezes. He has no rales. He exhibits no tenderness.  Abdominal: Soft. Bowel sounds are normal. He exhibits no distension. There is no tenderness. There is no rebound and  no guarding.  Musculoskeletal: Normal range of motion. He exhibits +1 edema and no tenderness.  Neurological: He is alert and oriented to person, place, and time. Coordination normal.  Skin: Skin is warm and dry. No rash noted. He is not diaphoretic. No erythema. No pallor.  Psychiatric: He has a normal mood and affect. His behavior is normal. Judgment and thought content normal.    EKG: Sinus  Bradycardia  Low voltage -possible pulmonary disease.   ABNORMAL   ASSESSMENT AND PLAN

## 2012-12-15 NOTE — Patient Instructions (Addendum)
Resume Amlodipine 5 mg once daily and Carvedilol 6.25 mg twice daily.  Follow up in 6 months.

## 2012-12-28 DIAGNOSIS — M5137 Other intervertebral disc degeneration, lumbosacral region: Secondary | ICD-10-CM | POA: Diagnosis not present

## 2012-12-28 DIAGNOSIS — IMO0002 Reserved for concepts with insufficient information to code with codable children: Secondary | ICD-10-CM | POA: Diagnosis not present

## 2012-12-28 DIAGNOSIS — M999 Biomechanical lesion, unspecified: Secondary | ICD-10-CM | POA: Diagnosis not present

## 2013-01-05 DIAGNOSIS — IMO0002 Reserved for concepts with insufficient information to code with codable children: Secondary | ICD-10-CM | POA: Diagnosis not present

## 2013-01-05 DIAGNOSIS — M5137 Other intervertebral disc degeneration, lumbosacral region: Secondary | ICD-10-CM | POA: Diagnosis not present

## 2013-01-05 DIAGNOSIS — M999 Biomechanical lesion, unspecified: Secondary | ICD-10-CM | POA: Diagnosis not present

## 2013-01-06 DIAGNOSIS — M999 Biomechanical lesion, unspecified: Secondary | ICD-10-CM | POA: Diagnosis not present

## 2013-01-06 DIAGNOSIS — M5137 Other intervertebral disc degeneration, lumbosacral region: Secondary | ICD-10-CM | POA: Diagnosis not present

## 2013-01-06 DIAGNOSIS — IMO0002 Reserved for concepts with insufficient information to code with codable children: Secondary | ICD-10-CM | POA: Diagnosis not present

## 2013-01-09 DIAGNOSIS — M5137 Other intervertebral disc degeneration, lumbosacral region: Secondary | ICD-10-CM | POA: Diagnosis not present

## 2013-01-09 DIAGNOSIS — IMO0002 Reserved for concepts with insufficient information to code with codable children: Secondary | ICD-10-CM | POA: Diagnosis not present

## 2013-01-09 DIAGNOSIS — M999 Biomechanical lesion, unspecified: Secondary | ICD-10-CM | POA: Diagnosis not present

## 2013-01-25 DIAGNOSIS — IMO0002 Reserved for concepts with insufficient information to code with codable children: Secondary | ICD-10-CM | POA: Diagnosis not present

## 2013-01-25 DIAGNOSIS — M5137 Other intervertebral disc degeneration, lumbosacral region: Secondary | ICD-10-CM | POA: Diagnosis not present

## 2013-01-25 DIAGNOSIS — M999 Biomechanical lesion, unspecified: Secondary | ICD-10-CM | POA: Diagnosis not present

## 2013-03-01 DIAGNOSIS — M5137 Other intervertebral disc degeneration, lumbosacral region: Secondary | ICD-10-CM | POA: Diagnosis not present

## 2013-03-01 DIAGNOSIS — M999 Biomechanical lesion, unspecified: Secondary | ICD-10-CM | POA: Diagnosis not present

## 2013-03-01 DIAGNOSIS — M5412 Radiculopathy, cervical region: Secondary | ICD-10-CM | POA: Diagnosis not present

## 2013-03-01 DIAGNOSIS — M9981 Other biomechanical lesions of cervical region: Secondary | ICD-10-CM | POA: Diagnosis not present

## 2013-03-27 DIAGNOSIS — M5137 Other intervertebral disc degeneration, lumbosacral region: Secondary | ICD-10-CM | POA: Diagnosis not present

## 2013-03-27 DIAGNOSIS — M9981 Other biomechanical lesions of cervical region: Secondary | ICD-10-CM | POA: Diagnosis not present

## 2013-03-27 DIAGNOSIS — M5412 Radiculopathy, cervical region: Secondary | ICD-10-CM | POA: Diagnosis not present

## 2013-03-27 DIAGNOSIS — M999 Biomechanical lesion, unspecified: Secondary | ICD-10-CM | POA: Diagnosis not present

## 2013-04-24 DIAGNOSIS — M5137 Other intervertebral disc degeneration, lumbosacral region: Secondary | ICD-10-CM | POA: Diagnosis not present

## 2013-04-24 DIAGNOSIS — M5412 Radiculopathy, cervical region: Secondary | ICD-10-CM | POA: Diagnosis not present

## 2013-04-24 DIAGNOSIS — M999 Biomechanical lesion, unspecified: Secondary | ICD-10-CM | POA: Diagnosis not present

## 2013-04-24 DIAGNOSIS — M9981 Other biomechanical lesions of cervical region: Secondary | ICD-10-CM | POA: Diagnosis not present

## 2013-05-16 DIAGNOSIS — E291 Testicular hypofunction: Secondary | ICD-10-CM | POA: Diagnosis not present

## 2013-05-16 DIAGNOSIS — C61 Malignant neoplasm of prostate: Secondary | ICD-10-CM | POA: Diagnosis not present

## 2013-05-16 DIAGNOSIS — R351 Nocturia: Secondary | ICD-10-CM | POA: Diagnosis not present

## 2013-05-19 DIAGNOSIS — C61 Malignant neoplasm of prostate: Secondary | ICD-10-CM | POA: Diagnosis not present

## 2013-05-19 DIAGNOSIS — E291 Testicular hypofunction: Secondary | ICD-10-CM | POA: Diagnosis not present

## 2013-06-20 ENCOUNTER — Ambulatory Visit (INDEPENDENT_AMBULATORY_CARE_PROVIDER_SITE_OTHER): Payer: BC Managed Care – PPO | Admitting: Cardiovascular Disease

## 2013-06-20 ENCOUNTER — Encounter: Payer: Self-pay | Admitting: Cardiovascular Disease

## 2013-06-20 VITALS — BP 158/72 | HR 57 | Ht 68.0 in | Wt 231.5 lb

## 2013-06-20 DIAGNOSIS — R0789 Other chest pain: Secondary | ICD-10-CM

## 2013-06-20 DIAGNOSIS — I10 Essential (primary) hypertension: Secondary | ICD-10-CM

## 2013-06-20 MED ORDER — CARVEDILOL 6.25 MG PO TABS: 6.2500 mg | ORAL_TABLET | Freq: Two times a day (BID) | ORAL | Status: DC

## 2013-06-20 NOTE — Assessment & Plan Note (Signed)
Blood pressure is still mildly elevated. I think the next step is to add a small dose thiazide diuretic especially with his mild lower extremity edema. I discussed this with him and he prefers not to add a medication. He is very resistant to any changes. He wants to continue exercise and weight loss for now. I will reevaluate him in 6 months.

## 2013-06-20 NOTE — Patient Instructions (Signed)
Continue same medications.   Your physician wants you to follow-up in: 6 months.  You will receive a reminder letter in the mail two months in advance. If you don't receive a letter, please call our office to schedule the follow-up appointment.  

## 2013-06-20 NOTE — Progress Notes (Signed)
HPI  This is a 79 year old male who is here today for a followup visit.  The patient has prolonged history of hypertension with poor adherence to medical therapy. He did have chest pain few years ago and was evaluated by a stress test and echocardiogram. Both of them were unremarkable. He never had cardiac catheterization in the past. He was seen in 2013 for recurrent episodes of substernal and right-sided chest discomfort radiating to his right arm mostly at rest and associated with numbness in the right arm. He was evaluated at the emergency department at [facility] where He was noted to be hypertensive. His labs were unremarkable. CT scan showed no evidence of aortic dissection.  Treadmill nuclear stress test in August of 2013 showed no evidence of ischemia with normal ejection fraction. He was treated with carvedilol and amlodipine for hypertension with gradual improvement. Home blood pressure is ranging form 145/70-158/76. He continues to exercise regularly at the gym. Heart rate goes up to 105 with exercise with no chest pain or dyspnea.  No Known Allergies   Current Outpatient Prescriptions on File Prior to Visit  Medication Sig Dispense Refill  . amLODipine (NORVASC) 5 MG tablet Take 1 tablet (5 mg total) by mouth daily.  30 tablet  6  . aspirin 81 MG tablet Take 81 mg by mouth daily.      . carvedilol (COREG) 6.25 MG tablet Take 1 tablet (6.25 mg total) by mouth 2 (two) times daily.  60 tablet  6  . CIALIS 5 MG tablet Take 5 mg by mouth daily as needed.       . Omega-3 Fatty Acids (FISH OIL) 1200 MG CAPS Take 1,200 mg by mouth daily.      . Testosterone (AXIRON) 30 MG/ACT SOLN Place onto the skin.       No current facility-administered medications on file prior to visit.     Past Medical History  Diagnosis Date  . Hypertension   . H/O prostate cancer     was treated with radiation     History reviewed. No pertinent past surgical history.   Family History  Problem Relation  Age of Onset  . Heart disease Brother   . Heart attack Brother      History   Social History  . Marital Status: Married    Spouse Name: N/A    Number of Children: N/A  . Years of Education: N/A   Occupational History  . Not on file.   Social History Main Topics  . Smoking status: Former Smoker -- 1.00 packs/day for 15 years    Types: Cigarettes    Quit date: 09/14/1965  . Smokeless tobacco: Not on file  . Alcohol Use: 3.5 oz/week    7 drink(s) per week     Comment: daily  . Drug Use: No  . Sexual Activity: Not on file   Other Topics Concern  . Not on file   Social History Narrative  . No narrative on file     PHYSICAL EXAM   BP 158/72  Pulse 57  Ht 5\' 8"  (1.727 m)  Wt 231 lb 8 oz (105.008 kg)  BMI 35.21 kg/m2 Constitutional: He is oriented to person, place, and time. He appears well-developed and well-nourished. No distress.  HENT: No nasal discharge.  Head: Normocephalic and atraumatic.  Eyes: Pupils are equal and round. Right eye exhibits no discharge. Left eye exhibits no discharge.  Neck: Normal range of motion. Neck supple. No JVD present.  No thyromegaly present.  Cardiovascular: Normal rate, regular rhythm, normal heart sounds and. Exam reveals no gallop and no friction rub. No murmur heard.  Pulmonary/Chest: Effort normal and breath sounds normal. No stridor. No respiratory distress. He has no wheezes. He has no rales. He exhibits no tenderness.  Abdominal: Soft. Bowel sounds are normal. He exhibits no distension. There is no tenderness. There is no rebound and no guarding.  Musculoskeletal: Normal range of motion. He exhibits +1 edema and no tenderness.  Neurological: He is alert and oriented to person, place, and time. Coordination normal.  Skin: Skin is warm and dry. No rash noted. He is not diaphoretic. No erythema. No pallor.  Psychiatric: He has a normal mood and affect. His behavior is normal. Judgment and thought content normal.    EKG: Sinus   Bradycardia  Low voltage in precordial leads.   -Nonspecific ST depression   +   T-abnormality.   ABNORMAL    ASSESSMENT AND PLAN

## 2013-06-20 NOTE — Assessment & Plan Note (Signed)
No recurrent chest pain. Stress test last year showed no evidence of ischemia.

## 2013-07-06 ENCOUNTER — Other Ambulatory Visit: Payer: Self-pay | Admitting: Cardiovascular Disease

## 2013-07-06 ENCOUNTER — Other Ambulatory Visit: Payer: Self-pay | Admitting: *Deleted

## 2013-07-06 MED ORDER — AMLODIPINE BESYLATE 5 MG PO TABS: 5.0000 mg | ORAL_TABLET | Freq: Every day | ORAL | Status: DC

## 2013-07-06 NOTE — Telephone Encounter (Signed)
Requested Prescriptions   Signed Prescriptions Disp Refills  . amLODipine (NORVASC) 5 MG tablet 30 tablet 3    Sig: Take 1 tablet (5 mg total) by mouth daily.    Authorizing Provider: ARIDA, MUHAMMAD A    Ordering User: LOPEZ, MARINA C    

## 2013-08-14 DIAGNOSIS — M999 Biomechanical lesion, unspecified: Secondary | ICD-10-CM | POA: Diagnosis not present

## 2013-08-14 DIAGNOSIS — M9981 Other biomechanical lesions of cervical region: Secondary | ICD-10-CM | POA: Diagnosis not present

## 2013-08-14 DIAGNOSIS — M5412 Radiculopathy, cervical region: Secondary | ICD-10-CM | POA: Diagnosis not present

## 2013-08-14 DIAGNOSIS — M5137 Other intervertebral disc degeneration, lumbosacral region: Secondary | ICD-10-CM | POA: Diagnosis not present

## 2013-09-11 DIAGNOSIS — M5412 Radiculopathy, cervical region: Secondary | ICD-10-CM | POA: Diagnosis not present

## 2013-09-11 DIAGNOSIS — M9981 Other biomechanical lesions of cervical region: Secondary | ICD-10-CM | POA: Diagnosis not present

## 2013-09-11 DIAGNOSIS — M999 Biomechanical lesion, unspecified: Secondary | ICD-10-CM | POA: Diagnosis not present

## 2013-09-11 DIAGNOSIS — M5137 Other intervertebral disc degeneration, lumbosacral region: Secondary | ICD-10-CM | POA: Diagnosis not present

## 2013-10-09 DIAGNOSIS — M5137 Other intervertebral disc degeneration, lumbosacral region: Secondary | ICD-10-CM | POA: Diagnosis not present

## 2013-10-09 DIAGNOSIS — M9981 Other biomechanical lesions of cervical region: Secondary | ICD-10-CM | POA: Diagnosis not present

## 2013-10-09 DIAGNOSIS — M999 Biomechanical lesion, unspecified: Secondary | ICD-10-CM | POA: Diagnosis not present

## 2013-10-09 DIAGNOSIS — M5412 Radiculopathy, cervical region: Secondary | ICD-10-CM | POA: Diagnosis not present

## 2013-11-06 DIAGNOSIS — M5412 Radiculopathy, cervical region: Secondary | ICD-10-CM | POA: Diagnosis not present

## 2013-11-06 DIAGNOSIS — M999 Biomechanical lesion, unspecified: Secondary | ICD-10-CM | POA: Diagnosis not present

## 2013-11-06 DIAGNOSIS — M9981 Other biomechanical lesions of cervical region: Secondary | ICD-10-CM | POA: Diagnosis not present

## 2013-11-06 DIAGNOSIS — M5137 Other intervertebral disc degeneration, lumbosacral region: Secondary | ICD-10-CM | POA: Diagnosis not present

## 2013-11-21 DIAGNOSIS — E291 Testicular hypofunction: Secondary | ICD-10-CM | POA: Diagnosis not present

## 2013-11-21 DIAGNOSIS — C61 Malignant neoplasm of prostate: Secondary | ICD-10-CM | POA: Diagnosis not present

## 2013-11-27 DIAGNOSIS — E291 Testicular hypofunction: Secondary | ICD-10-CM | POA: Diagnosis not present

## 2013-11-27 DIAGNOSIS — R351 Nocturia: Secondary | ICD-10-CM | POA: Diagnosis not present

## 2013-11-27 DIAGNOSIS — C61 Malignant neoplasm of prostate: Secondary | ICD-10-CM | POA: Diagnosis not present

## 2014-01-01 ENCOUNTER — Ambulatory Visit: Payer: BC Managed Care – PPO | Admitting: Cardiovascular Disease

## 2014-01-01 DIAGNOSIS — M5412 Radiculopathy, cervical region: Secondary | ICD-10-CM | POA: Diagnosis not present

## 2014-01-01 DIAGNOSIS — M999 Biomechanical lesion, unspecified: Secondary | ICD-10-CM | POA: Diagnosis not present

## 2014-01-01 DIAGNOSIS — M9981 Other biomechanical lesions of cervical region: Secondary | ICD-10-CM | POA: Diagnosis not present

## 2014-01-01 DIAGNOSIS — M5137 Other intervertebral disc degeneration, lumbosacral region: Secondary | ICD-10-CM | POA: Diagnosis not present

## 2014-01-04 ENCOUNTER — Encounter (INDEPENDENT_AMBULATORY_CARE_PROVIDER_SITE_OTHER): Payer: Self-pay

## 2014-01-04 ENCOUNTER — Encounter: Payer: Self-pay | Admitting: Cardiovascular Disease

## 2014-01-04 ENCOUNTER — Ambulatory Visit (INDEPENDENT_AMBULATORY_CARE_PROVIDER_SITE_OTHER): Payer: Medicare Other | Admitting: Cardiovascular Disease

## 2014-01-04 VITALS — BP 182/80 | HR 65 | Ht 68.0 in | Wt 228.0 lb

## 2014-01-04 DIAGNOSIS — I10 Essential (primary) hypertension: Secondary | ICD-10-CM

## 2014-01-04 DIAGNOSIS — R0789 Other chest pain: Secondary | ICD-10-CM

## 2014-01-04 MED ORDER — HYDROCHLOROTHIAZIDE 12.5 MG PO CAPS: 12.5000 mg | ORAL_CAPSULE | Freq: Every day | ORAL | Status: DC

## 2014-01-04 NOTE — Assessment & Plan Note (Addendum)
Unfortunately, the patient stopped taking carvedilol on his own and does not want to resume this medication. He did not have particular side effects. However, he always reports fatigue and no energy when he is on blood pressure medications. I had a prolonged discussion with him about the adverse effects of uncontrolled hypertension. Initial treatment of prolonged hypotension usually needs to fatigue and the first few days to few weeks of treatment. However, this usually improves with time. Continue treatment with amlodipine. I added hydrochlorothiazide 12.5 mg once daily. Check routine labs next week given that he has not had labs in many years. He is no longer seeing a primary care physician.

## 2014-01-04 NOTE — Progress Notes (Signed)
HPI  This is a 80 year old male who is here today for a followup visit regarding hypertension.  The patient has prolonged history of hypertension with poor adherence to medical therapy. He never had cardiac catheterization in the past. He was seen in 2013 for recurrent episodes of substernal and right-sided chest discomfort radiating to his right arm mostly at rest and associated with numbness in the right arm. He was evaluated at the emergency department at [facility] where He was noted to be hypertensive. His labs were unremarkable. CT scan showed no evidence of aortic dissection.  Treadmill nuclear stress test in August of 2013 showed no evidence of ischemia with normal ejection fraction. He was treated with carvedilol and amlodipine for hypertension with gradual improvement.  He is very concerned about taking long-term medications and stop carvedilol in his own. Home blood pressure readings are ranging from 454-098 systolic. Blood pressure tends to be high up at the physician's office. Overall he is asymptomatic and denies chest pain or dyspnea.  No Known Allergies   Current Outpatient Prescriptions on File Prior to Visit  Medication Sig Dispense Refill  . amLODipine (NORVASC) 5 MG tablet TAKE 1 TABLET (5 MG TOTAL) BY MOUTH DAILY.  30 tablet  6  . Omega-3 Fatty Acids (FISH OIL) 1200 MG CAPS Take 1,200 mg by mouth daily.       No current facility-administered medications on file prior to visit.     Past Medical History  Diagnosis Date  . Hypertension   . H/O prostate cancer     was treated with radiation  . Shingles      History reviewed. No pertinent past surgical history.   Family History  Problem Relation Age of Onset  . Heart disease Brother   . Heart attack Brother      History   Social History  . Marital Status: Married    Spouse Name: N/A    Number of Children: N/A  . Years of Education: N/A   Occupational History  . Not on file.   Social History Main Topics   . Smoking status: Former Smoker -- 1.00 packs/day for 15 years    Types: Cigarettes    Quit date: 09/14/1965  . Smokeless tobacco: Not on file  . Alcohol Use: 3.5 oz/week    7 drink(s) per week     Comment: daily  . Drug Use: No  . Sexual Activity: Not on file   Other Topics Concern  . Not on file   Social History Narrative  . No narrative on file     PHYSICAL EXAM   BP 182/80  Pulse 65  Ht 5\' 8"  (1.727 m)  Wt 228 lb (103.42 kg)  BMI 34.68 kg/m2 Constitutional: He is oriented to person, place, and time. He appears well-developed and well-nourished. No distress.  HENT: No nasal discharge.  Head: Normocephalic and atraumatic.  Eyes: Pupils are equal and round. Right eye exhibits no discharge. Left eye exhibits no discharge.  Neck: Normal range of motion. Neck supple. No JVD present. No thyromegaly present.  Cardiovascular: Normal rate, regular rhythm, normal heart sounds and. Exam reveals no gallop and no friction rub. No murmur heard.  Pulmonary/Chest: Effort normal and breath sounds normal. No stridor. No respiratory distress. He has no wheezes. He has no rales. He exhibits no tenderness.  Abdominal: Soft. Bowel sounds are normal. He exhibits no distension. There is no tenderness. There is no rebound and no guarding.  Musculoskeletal: Normal range of  motion. He exhibits +1 edema and no tenderness.  Neurological: He is alert and oriented to person, place, and time. Coordination normal.  Skin: Skin is warm and dry. No rash noted. He is not diaphoretic. No erythema. No pallor.  Psychiatric: He has a normal mood and affect. His behavior is normal. Judgment and thought content normal.    EKG: Sinus  Rhythm  -Prominent R(V1) and right axis -consider right ventricular hypertrophy  -consider pulmonary disease.   -Nonspecific ST changes   ABNORMAL    ASSESSMENT AND PLAN

## 2014-01-04 NOTE — Patient Instructions (Signed)
Your physician has recommended you make the following change in your medication:  Start Hydrochlorothiazide 12.5 mg daily    Your physician recommends that you return for lab work in:  1 week for CBC and BMP  Your physician wants you to follow-up in: 6 months with Dr. Fletcher Anon. You will receive a reminder letter in the mail two months in advance. If you don't receive a letter, please call our office to schedule the follow-up appointment.

## 2014-01-09 DIAGNOSIS — M5137 Other intervertebral disc degeneration, lumbosacral region: Secondary | ICD-10-CM | POA: Diagnosis not present

## 2014-01-09 DIAGNOSIS — M9981 Other biomechanical lesions of cervical region: Secondary | ICD-10-CM | POA: Diagnosis not present

## 2014-01-09 DIAGNOSIS — M999 Biomechanical lesion, unspecified: Secondary | ICD-10-CM | POA: Diagnosis not present

## 2014-01-09 DIAGNOSIS — M5412 Radiculopathy, cervical region: Secondary | ICD-10-CM | POA: Diagnosis not present

## 2014-01-10 ENCOUNTER — Ambulatory Visit (INDEPENDENT_AMBULATORY_CARE_PROVIDER_SITE_OTHER): Payer: Medicare Other | Admitting: *Deleted

## 2014-01-10 DIAGNOSIS — I10 Essential (primary) hypertension: Secondary | ICD-10-CM

## 2014-01-29 DIAGNOSIS — M9981 Other biomechanical lesions of cervical region: Secondary | ICD-10-CM | POA: Diagnosis not present

## 2014-01-29 DIAGNOSIS — M5412 Radiculopathy, cervical region: Secondary | ICD-10-CM | POA: Diagnosis not present

## 2014-01-29 DIAGNOSIS — M999 Biomechanical lesion, unspecified: Secondary | ICD-10-CM | POA: Diagnosis not present

## 2014-01-29 DIAGNOSIS — M5137 Other intervertebral disc degeneration, lumbosacral region: Secondary | ICD-10-CM | POA: Diagnosis not present

## 2014-02-26 DIAGNOSIS — M5137 Other intervertebral disc degeneration, lumbosacral region: Secondary | ICD-10-CM | POA: Diagnosis not present

## 2014-02-26 DIAGNOSIS — M999 Biomechanical lesion, unspecified: Secondary | ICD-10-CM | POA: Diagnosis not present

## 2014-02-26 DIAGNOSIS — M5412 Radiculopathy, cervical region: Secondary | ICD-10-CM | POA: Diagnosis not present

## 2014-02-26 DIAGNOSIS — M9981 Other biomechanical lesions of cervical region: Secondary | ICD-10-CM | POA: Diagnosis not present

## 2014-03-20 ENCOUNTER — Other Ambulatory Visit: Payer: Self-pay | Admitting: Cardiovascular Disease

## 2014-03-26 DIAGNOSIS — M9981 Other biomechanical lesions of cervical region: Secondary | ICD-10-CM | POA: Diagnosis not present

## 2014-03-26 DIAGNOSIS — M5137 Other intervertebral disc degeneration, lumbosacral region: Secondary | ICD-10-CM | POA: Diagnosis not present

## 2014-03-26 DIAGNOSIS — M999 Biomechanical lesion, unspecified: Secondary | ICD-10-CM | POA: Diagnosis not present

## 2014-03-26 DIAGNOSIS — M5412 Radiculopathy, cervical region: Secondary | ICD-10-CM | POA: Diagnosis not present

## 2014-04-19 ENCOUNTER — Encounter: Payer: Self-pay | Admitting: Internal Medicine

## 2014-04-19 ENCOUNTER — Ambulatory Visit (INDEPENDENT_AMBULATORY_CARE_PROVIDER_SITE_OTHER): Payer: Medicare Other | Admitting: Internal Medicine

## 2014-04-19 VITALS — BP 170/72 | HR 92 | Ht 68.0 in | Wt 230.0 lb

## 2014-04-19 DIAGNOSIS — G4733 Obstructive sleep apnea (adult) (pediatric): Secondary | ICD-10-CM | POA: Diagnosis not present

## 2014-04-19 NOTE — Progress Notes (Signed)
04/19/14- 7 yoM former smoker seen for OSA FOLLOWS FOR: Pt self referred for sleep apnea. Pt diagnosed with sleep apnea in 1979. EPWORTH=1/24 Original sleep study in [place] in 1979. Has been using CPAP since then, set on autotitration. Sleeps well, very well rested. Had LAUP surgery- ineffective. He needs a new mask and the source for supplies which he had been getting online  bedtime between 9 and 10 PM, sleep latency 10 minutes, up once before up finally at 6:30 AM. He notices he is swallowing some air, and we discussed whether pressure might be too high.  Prior to Admission medications   Medication Sig Start Date End Date Taking? Authorizing Provider  amLODipine (NORVASC) 5 MG tablet TAKE 1 TABLET (5 MG TOTAL) BY MOUTH DAILY. 07/06/13  Yes Wellington Hampshire, MD  hydrochlorothiazide (MICROZIDE) 12.5 MG capsule Take 1 capsule (12.5 mg total) by mouth daily. 01/04/14  Yes Wellington Hampshire, MD  Multiple Vitamin (MULTIVITAMIN) capsule Take 1 capsule by mouth daily.   Yes Historical Provider, MD  Omega-3 Fatty Acids (FISH OIL) 1200 MG CAPS Take 1,200 mg by mouth daily.   Yes Historical Provider, MD  testosterone (ANDROGEL) 50 MG/5GM (1%) GEL Place onto the skin daily.   Yes Historical Provider, MD   Past Medical History  Diagnosis Date  . Hypertension   . H/O prostate cancer     was treated with radiation  . Shingles    Past Surgical History  Procedure Laterality Date  . Abdominal surgery      ruptured intestines    Family History  Problem Relation Age of Onset  . Heart disease Brother   . Heart attack Brother    History   Social History  . Marital Status: Married    Spouse Name: N/A    Number of Children: N/A  . Years of Education: N/A   Occupational History  . retired    Social History Main Topics  . Smoking status: Former Smoker -- 1.00 packs/day for 15 years    Types: Cigarettes    Quit date: 09/14/1965  . Smokeless tobacco: Never Used  . Alcohol Use: 3.5 oz/week   7 drink(s) per week     Comment: daily  . Drug Use: No  . Sexual Activity: Not on file   Other Topics Concern  . Not on file   Social History Narrative  . No narrative on file   ROS-see HPI Constitutional:   No-   weight loss, night sweats, fevers, chills, fatigue, lassitude. HEENT:   No-  headaches, difficulty swallowing, tooth/dental problems, sore throat,       No-  sneezing, itching, ear ache, nasal congestion, post nasal drip,  CV:  No-   chest pain, orthopnea, PND, swelling in lower extremities, anasarca,                                  dizziness, palpitations Resp: No-   shortness of breath with exertion or at rest.              No-   productive cough,  No non-productive cough,  No- coughing up of blood.              No-   change in color of mucus.  No- wheezing.   Skin: No-   rash or lesions. GI:  No-   heartburn, indigestion, abdominal pain, nausea, vomiting, diarrhea,  change in bowel habits, loss of appetite GU: No-   dysuria, change in color of urine, no urgency or frequency.  No- flank pain. MS:  No-   joint pain or swelling.  No- decreased range of motion.  No- back pain. Neuro-     nothing unusual Psych:  No- change in mood or affect. No depression or anxiety.  No memory loss.  OBJ- Physical Exam General- Alert, Oriented, Affect-appropriate, Distress- none acute, + Overweight Skin- rash-none, lesions- none, excoriation- none Lymphadenopathy- none Head- atraumatic            Eyes- Gross vision intact, PERRLA, conjunctivae and secretions clear            Ears- Hearing, canals-normal            Nose- Clear, no-Septal dev, mucus, polyps, erosion, perforation             Throat- s/p UPPP, Mallampati II , mucosa clear , drainage- none, tonsils- atrophic Neck- flexible , trachea midline, no stridor , thyroid nl, carotid no bruit Chest - symmetrical excursion , unlabored           Heart/CV- RRR , no murmur , no gallop  , no rub, nl s1 s2                            - JVD- none , edema- none, stasis changes- none, varices- none           Lung- clear to P&A, wheeze- none, cough- none , dullness-none, rub- none           Chest wall-  Abd- tender-no, distended-no, bowel sounds-present, HSM- no Br/ Gen/ Rectal- Not done, not indicated Extrem- cyanosis- none, clubbing, none, atrophy- none, strength- nl Neuro- grossly intact to observation

## 2014-04-19 NOTE — Patient Instructions (Signed)
Order- [facility]  Download CPAP machines for pressure compliance             [facility]  Replacement CPAP machine auto Pap 5-20, supplies, mask of choice, humidifier   Dx OSA

## 2014-04-23 NOTE — Assessment & Plan Note (Addendum)
We discussed potential need to update diagnostic record. For now, we will ask [facility] for replacement supplies. His compliance and control are described as quite good. Plan- replacement CPAP supplies. Download for pressure and compliance

## 2014-04-30 DIAGNOSIS — M5137 Other intervertebral disc degeneration, lumbosacral region: Secondary | ICD-10-CM | POA: Diagnosis not present

## 2014-04-30 DIAGNOSIS — M5412 Radiculopathy, cervical region: Secondary | ICD-10-CM | POA: Diagnosis not present

## 2014-04-30 DIAGNOSIS — M9981 Other biomechanical lesions of cervical region: Secondary | ICD-10-CM | POA: Diagnosis not present

## 2014-04-30 DIAGNOSIS — M999 Biomechanical lesion, unspecified: Secondary | ICD-10-CM | POA: Diagnosis not present

## 2014-04-30 DIAGNOSIS — E291 Testicular hypofunction: Secondary | ICD-10-CM | POA: Diagnosis not present

## 2014-04-30 DIAGNOSIS — R972 Elevated prostate specific antigen [PSA]: Secondary | ICD-10-CM | POA: Diagnosis not present

## 2014-05-02 ENCOUNTER — Telehealth: Payer: Self-pay | Admitting: Internal Medicine

## 2014-05-02 ENCOUNTER — Other Ambulatory Visit: Payer: Self-pay | Admitting: Internal Medicine

## 2014-05-02 DIAGNOSIS — G4733 Obstructive sleep apnea (adult) (pediatric): Secondary | ICD-10-CM

## 2014-05-02 NOTE — Telephone Encounter (Signed)
Dr Annamaria Boots please advise if pt may have a Home Sleep Test to requalify for CPAP as [facility] is unable to locate his current sleep study.  Thank you.  LMOM TCB x1 just to let pt know that CY is out of the office this afternoon.

## 2014-05-02 NOTE — Telephone Encounter (Signed)
Pt states that [facility] contacted him and stated that [facility] couldn't locate his current sleep study. Pt stated that Dr. Annamaria Boots mentioned that this may happen and if so that he would have to have a repeat sleep study.  Pt is requesting to have a HST. If this is ok, please send order for HST to [facility] to schedule. Rhonda J Cobb

## 2014-05-03 NOTE — Telephone Encounter (Signed)
ATC -- continuous ringing/NA  Please advise Dr Annamaria Boots. Thanks.

## 2014-05-03 NOTE — Telephone Encounter (Signed)
Order for Home Sleep Study was placed 05/02/14

## 2014-05-03 NOTE — Telephone Encounter (Signed)
Ok to order unattended sleep study for dx OSA

## 2014-05-08 NOTE — Telephone Encounter (Signed)
Spoke to [email or web address] she has this pt set up to do HST on 05/10/14 pt is aware and will be coming to pick up Cesar Powell then Cesar Powell

## 2014-05-08 NOTE — Telephone Encounter (Signed)
Called and spoke to pt. Advised pt that sleep study has been ordered. PT stated he was already scheduled for the sleep study on 10/8 but there is nothing documented int he system confirming that. Advised pt someone will call him back to confirm the appt or make a new appt.   Pt stated he is driving home from [facility] today just to have the sleep study on Thursday 10/8. Pt also stated he is going to [place] next week and it would be ideal to have the sleep study on 10/8.   Will forward to [facility] as urgent to advise further.

## 2014-05-10 DIAGNOSIS — G4733 Obstructive sleep apnea (adult) (pediatric): Secondary | ICD-10-CM | POA: Diagnosis not present

## 2014-05-14 DIAGNOSIS — R351 Nocturia: Secondary | ICD-10-CM | POA: Diagnosis not present

## 2014-05-14 DIAGNOSIS — C61 Malignant neoplasm of prostate: Secondary | ICD-10-CM | POA: Diagnosis not present

## 2014-05-14 DIAGNOSIS — E291 Testicular hypofunction: Secondary | ICD-10-CM | POA: Diagnosis not present

## 2014-05-15 DIAGNOSIS — G4733 Obstructive sleep apnea (adult) (pediatric): Secondary | ICD-10-CM | POA: Diagnosis not present

## 2014-05-16 ENCOUNTER — Encounter: Payer: Self-pay | Admitting: Internal Medicine

## 2014-05-28 DIAGNOSIS — M9901 Segmental and somatic dysfunction of cervical region: Secondary | ICD-10-CM | POA: Diagnosis not present

## 2014-05-28 DIAGNOSIS — M5136 Other intervertebral disc degeneration, lumbar region: Secondary | ICD-10-CM | POA: Diagnosis not present

## 2014-05-28 DIAGNOSIS — M9903 Segmental and somatic dysfunction of lumbar region: Secondary | ICD-10-CM | POA: Diagnosis not present

## 2014-05-28 DIAGNOSIS — M9905 Segmental and somatic dysfunction of pelvic region: Secondary | ICD-10-CM | POA: Diagnosis not present

## 2014-06-08 ENCOUNTER — Ambulatory Visit (INDEPENDENT_AMBULATORY_CARE_PROVIDER_SITE_OTHER): Payer: Medicare Other | Admitting: Internal Medicine

## 2014-06-08 ENCOUNTER — Encounter: Payer: Self-pay | Admitting: Internal Medicine

## 2014-06-08 VITALS — BP 128/78 | HR 62 | Ht 68.0 in | Wt 237.6 lb

## 2014-06-08 DIAGNOSIS — G4733 Obstructive sleep apnea (adult) (pediatric): Secondary | ICD-10-CM

## 2014-06-08 DIAGNOSIS — M9905 Segmental and somatic dysfunction of pelvic region: Secondary | ICD-10-CM | POA: Diagnosis not present

## 2014-06-08 DIAGNOSIS — M9901 Segmental and somatic dysfunction of cervical region: Secondary | ICD-10-CM | POA: Diagnosis not present

## 2014-06-08 DIAGNOSIS — M5136 Other intervertebral disc degeneration, lumbar region: Secondary | ICD-10-CM | POA: Diagnosis not present

## 2014-06-08 DIAGNOSIS — M9903 Segmental and somatic dysfunction of lumbar region: Secondary | ICD-10-CM | POA: Diagnosis not present

## 2014-06-08 DIAGNOSIS — E669 Obesity, unspecified: Secondary | ICD-10-CM | POA: Diagnosis not present

## 2014-06-08 NOTE — Assessment & Plan Note (Signed)
Weight loss would help his sleep apnea. He understands this

## 2014-06-08 NOTE — Assessment & Plan Note (Signed)
Appropriate education, discussion of treatments, goals and options Plan-CPAP with AutoPap which he is used to. Option to narrow the range or change to fixed pressure Prescription so he can self-pay for a small battery-operated portable machine for travel, while insurance pays for his standard machine for home use

## 2014-06-08 NOTE — Progress Notes (Signed)
04/19/14- 55 yoM former smoker seen for OSA FOLLOWS FOR: Pt self referred for sleep apnea. Pt diagnosed with sleep apnea in 1979. EPWORTH=1/24 Original sleep study in [place] in 1979. Has been using CPAP since then, set on autotitration. Sleeps well, very well rested. Had LAUP surgery- ineffective. He needs a new mask and the source for supplies which he had been getting online  bedtime between 9 and 10 PM, sleep latency 10 minutes, up once before up finally at 6:30 AM. He notices he is swallowing some air, and we discussed whether pressure might be too high.  06/08/14- 69 yoM former smoker seen for OSA, s/p LAUP FOLLOWS FOR: Pt was unable to get CPAP through [facility]-had to complete HST-results attached and can reorder to [facility] for CPAP accordingly. Unattended Home sleep Study- 05/10/14- Confirms severe OSA, AHI 41.8/ hr, weight 230 pounds He says sleep study night was miserable without CPAP. He is pleased to be restarted using AutoPap. He would like to self-headache for a small portable device "Transcend"press down page  ROS-see HPI Constitutional:   No-   weight loss, night sweats, fevers, chills, fatigue, lassitude. HEENT:   No-  headaches, difficulty swallowing, tooth/dental problems, sore throat,       No-  sneezing, itching, ear ache, nasal congestion, post nasal drip,  CV:  No-   chest pain, orthopnea, PND, swelling in lower extremities, anasarca,  dizziness, palpitations Resp: No-   shortness of breath with exertion or at rest.              No-   productive cough,  No non-productive cough,  No- coughing up of blood.              No-   change in color of mucus.  No- wheezing.   Skin: No-   rash or lesions. GI:  No-   heartburn, indigestion, abdominal pain, nausea, vomiting,  GU: . MS:  No-   joint pain or swelling.   Neuro-     nothing unusual Psych:  No- change in mood or affect. No depression or anxiety.  No memory loss.  OBJ- Physical Exam General- Alert, Oriented, Affect-appropriate,  Distress- none acute, + Overweight Skin- rash-none, lesions- none, excoriation- none Lymphadenopathy- none Head- atraumatic            Eyes- Gross vision intact, PERRLA, conjunctivae and secretions clear            Ears- Hearing, canals-normal            Nose- Clear, no-Septal dev, mucus, polyps, erosion, perforation             Throat- s/p UPPP, Mallampati II , mucosa clear , drainage- none, tonsils- atrophic Neck- flexible , trachea midline, no stridor , thyroid nl, carotid no bruit Chest - symmetrical excursion , unlabored           Heart/CV- RRR , no murmur , no gallop  , no rub, nl s1 s2                           - JVD- none , edema- none, stasis changes- none, varices- none           Lung- clear to P&A, wheeze- none, cough- none , dullness-none, rub- none           Chest wall-  Abd- tender-no, distended-no, bowel sounds-present, HSM- no Br/ Gen/ Rectal- Not done, not indicated Extrem- cyanosis- none, clubbing, none,  atrophy- none, strength- nl Neuro- grossly intact to observation

## 2014-06-08 NOTE — Patient Instructions (Signed)
Order- [facility]- new CPAP autoPAP 5-20 cwp, mask of choice, humidifier, supplies    Dx OSA  Also print script for CPAP, autoPAP 5-20, mask of choice, humidifier, supplies    Dx OSA  Please call as needed

## 2014-06-11 DIAGNOSIS — M9901 Segmental and somatic dysfunction of cervical region: Secondary | ICD-10-CM | POA: Diagnosis not present

## 2014-06-11 DIAGNOSIS — M9905 Segmental and somatic dysfunction of pelvic region: Secondary | ICD-10-CM | POA: Diagnosis not present

## 2014-06-11 DIAGNOSIS — M5136 Other intervertebral disc degeneration, lumbar region: Secondary | ICD-10-CM | POA: Diagnosis not present

## 2014-06-11 DIAGNOSIS — M9903 Segmental and somatic dysfunction of lumbar region: Secondary | ICD-10-CM | POA: Diagnosis not present

## 2014-06-15 ENCOUNTER — Other Ambulatory Visit: Payer: Self-pay | Admitting: Cardiovascular Disease

## 2014-06-25 DIAGNOSIS — M9905 Segmental and somatic dysfunction of pelvic region: Secondary | ICD-10-CM | POA: Diagnosis not present

## 2014-06-25 DIAGNOSIS — M5136 Other intervertebral disc degeneration, lumbar region: Secondary | ICD-10-CM | POA: Diagnosis not present

## 2014-06-25 DIAGNOSIS — M9903 Segmental and somatic dysfunction of lumbar region: Secondary | ICD-10-CM | POA: Diagnosis not present

## 2014-06-25 DIAGNOSIS — M9901 Segmental and somatic dysfunction of cervical region: Secondary | ICD-10-CM | POA: Diagnosis not present

## 2014-07-02 DIAGNOSIS — H2513 Age-related nuclear cataract, bilateral: Secondary | ICD-10-CM | POA: Diagnosis not present

## 2014-07-19 ENCOUNTER — Other Ambulatory Visit: Payer: Self-pay | Admitting: Cardiovascular Disease

## 2014-07-19 NOTE — Telephone Encounter (Signed)
LMOM to call to set up an appointment with Dr. Fletcher Anon.

## 2014-07-23 DIAGNOSIS — M9901 Segmental and somatic dysfunction of cervical region: Secondary | ICD-10-CM | POA: Diagnosis not present

## 2014-07-23 DIAGNOSIS — M9903 Segmental and somatic dysfunction of lumbar region: Secondary | ICD-10-CM | POA: Diagnosis not present

## 2014-07-23 DIAGNOSIS — M5136 Other intervertebral disc degeneration, lumbar region: Secondary | ICD-10-CM | POA: Diagnosis not present

## 2014-07-23 DIAGNOSIS — M9905 Segmental and somatic dysfunction of pelvic region: Secondary | ICD-10-CM | POA: Diagnosis not present

## 2014-08-06 ENCOUNTER — Other Ambulatory Visit: Payer: Self-pay | Admitting: Cardiovascular Disease

## 2014-08-09 ENCOUNTER — Encounter: Payer: Self-pay | Admitting: Internal Medicine

## 2014-08-09 ENCOUNTER — Ambulatory Visit (INDEPENDENT_AMBULATORY_CARE_PROVIDER_SITE_OTHER): Payer: Medicare Other | Admitting: Internal Medicine

## 2014-08-09 VITALS — BP 154/70 | HR 69 | Ht 68.0 in | Wt 237.4 lb

## 2014-08-09 DIAGNOSIS — G4733 Obstructive sleep apnea (adult) (pediatric): Secondary | ICD-10-CM

## 2014-08-09 NOTE — Progress Notes (Signed)
04/19/14- 30 yoM former smoker seen for OSA FOLLOWS FOR: Pt self referred for sleep apnea. Pt diagnosed with sleep apnea in 1979. EPWORTH=1/24 Original sleep study in [place] in 1979. Has been using CPAP since then, set on autotitration. Sleeps well, very well rested. Had LAUP surgery- ineffective. He needs a new mask and the source for supplies which he had been getting online  bedtime between 9 and 10 PM, sleep latency 10 minutes, up once before up finally at 6:30 AM. He notices he is swallowing some air, and we discussed whether pressure might be too high.  06/08/14- 85 yoM former smoker seen for OSA, s/p LAUP FOLLOWS FOR: Pt was unable to get CPAP through [facility]-had to complete HST-results attached and can reorder to [facility] for CPAP accordingly. Unattended Home sleep Study- 05/10/14- Confirms severe OSA, AHI 41.8/ hr, weight 230 pounds He says sleep study night was miserable without CPAP. He is pleased to be restarted using AutoPap. He would like to self-pay for a small portable device "Transcend"press down page  08/09/14- 43 yoM former smoker seen for OSA, s/p LAUP FOLLOWS FOR: Wears CPAP nightly. Denies problems with mask/pressure. Pt reports that this travel machine does not seem to be set correctly pressure wise. Download documents adequate use with AutoPap 5-20 giving good control. 4 travel he has a Dentist which is blowing too hard.  ROS-see HPI Constitutional:   No-   weight loss, night sweats, fevers, chills, fatigue, lassitude. HEENT:   No-  headaches, difficulty swallowing, tooth/dental problems, sore throat,       No-  sneezing, itching, ear ache, nasal congestion, post nasal drip,  CV:  No-   chest pain, orthopnea, PND, swelling in lower extremities, anasarca,  dizziness, palpitations Resp: No-   shortness of breath with exertion or at rest.              No-   productive cough,  No non-productive cough,  No- coughing up of blood.              No-   change in color of mucus.   No- wheezing.   Skin: No-   rash or lesions. GI:  No-   heartburn, indigestion, abdominal pain, nausea, vomiting,  GU: . MS:  No-   joint pain or swelling.   Neuro-     nothing unusual Psych:  No- change in mood or affect. No depression or anxiety.  No memory loss.  OBJ- Physical Exam General- Alert, Oriented, Affect-appropriate, Distress- none acute,                      + Overweight Skin- rash-none, lesions- none, excoriation- none Lymphadenopathy- none Head- atraumatic            Eyes- Gross vision intact, PERRLA, conjunctivae and secretions clear            Ears- Hearing, canals-normal            Nose- Clear, no-Septal dev, mucus, polyps, erosion, perforation             Throat- s/p UPPP, Mallampati II , mucosa clear , drainage- none, tonsils- atrophic Neck- flexible , trachea midline, no stridor , thyroid nl, carotid no bruit Chest - symmetrical excursion , unlabored           Heart/CV- RRR , no murmur , no gallop  , no rub, nl s1 s2                           -  JVD- none , edema- none, stasis changes- none, varices- none           Lung- clear to P&A, wheeze- none, cough- none , dullness-none, rub- none           Chest wall-  Abd-  Br/ Gen/ Rectal- Not done, not indicated Extrem- cyanosis- none, clubbing, none, atrophy- none, strength- nl Neuro- grossly intact to observation

## 2014-08-09 NOTE — Patient Instructions (Signed)
Order- [facility]- change both CPAP machines to autoset 8-15     Dx OSA  Please call as needed

## 2014-08-20 DIAGNOSIS — M9901 Segmental and somatic dysfunction of cervical region: Secondary | ICD-10-CM | POA: Diagnosis not present

## 2014-08-20 DIAGNOSIS — M5136 Other intervertebral disc degeneration, lumbar region: Secondary | ICD-10-CM | POA: Diagnosis not present

## 2014-08-20 DIAGNOSIS — M9905 Segmental and somatic dysfunction of pelvic region: Secondary | ICD-10-CM | POA: Diagnosis not present

## 2014-08-20 DIAGNOSIS — M9903 Segmental and somatic dysfunction of lumbar region: Secondary | ICD-10-CM | POA: Diagnosis not present

## 2014-08-24 ENCOUNTER — Ambulatory Visit: Payer: Medicare Other | Admitting: Cardiovascular Disease

## 2014-09-10 ENCOUNTER — Ambulatory Visit (INDEPENDENT_AMBULATORY_CARE_PROVIDER_SITE_OTHER): Payer: BLUE CROSS/BLUE SHIELD | Admitting: Cardiovascular Disease

## 2014-09-10 ENCOUNTER — Encounter: Payer: Self-pay | Admitting: Cardiovascular Disease

## 2014-09-10 VITALS — BP 180/68 | HR 64 | Ht 68.0 in | Wt 238.5 lb

## 2014-09-10 DIAGNOSIS — M9903 Segmental and somatic dysfunction of lumbar region: Secondary | ICD-10-CM | POA: Diagnosis not present

## 2014-09-10 DIAGNOSIS — I10 Essential (primary) hypertension: Secondary | ICD-10-CM

## 2014-09-10 DIAGNOSIS — M5136 Other intervertebral disc degeneration, lumbar region: Secondary | ICD-10-CM | POA: Diagnosis not present

## 2014-09-10 DIAGNOSIS — M9901 Segmental and somatic dysfunction of cervical region: Secondary | ICD-10-CM | POA: Diagnosis not present

## 2014-09-10 DIAGNOSIS — M9905 Segmental and somatic dysfunction of pelvic region: Secondary | ICD-10-CM | POA: Diagnosis not present

## 2014-09-10 MED ORDER — HYDROCHLOROTHIAZIDE 25 MG PO TABS: 25.0000 mg | ORAL_TABLET | Freq: Every day | ORAL | Status: DC

## 2014-09-10 NOTE — Patient Instructions (Signed)
Your physician has recommended you make the following change in your medication:  Increase Hydrochlorothiazide to 25 mg once daily   Your physician wants you to follow-up in: 6 months. You will receive a reminder letter in the mail two months in advance. If you don't receive a letter, please call our office to schedule the follow-up appointment.

## 2014-09-10 NOTE — Progress Notes (Signed)
HPI  This is a 81 year old male who is here today for a followup visit regarding hypertension.  The patient has prolonged history of hypertension with poor adherence to medical therapy. He never had cardiac catheterization in the past. He was seen in 2013 for recurrent episodes of substernal and right-sided chest discomfort radiating to his right arm mostly at rest and associated with numbness in the right arm. He was evaluated at the emergency department at [facility] where He was noted to be hypertensive. His labs were unremarkable. CT scan showed no evidence of aortic dissection.  Treadmill nuclear stress test in August of 2013 showed no evidence of ischemia with normal ejection fraction. He was treated with carvedilol and amlodipine for hypertension with gradual improvement.  carvedilol was discontinued due to fatigue reported by the patient. He was started on small dose HCTZ which has been well tolerated so far. He had a birthday and Super Bowl party yesterday with excessive sodium intake. He gained 3 pounds overnight. Blood pressure is elevated. He denies chest pain or shortness of breath.     No Known Allergies   Current Outpatient Prescriptions on File Prior to Visit  Medication Sig Dispense Refill  . amLODipine (NORVASC) 5 MG tablet TAKE 1 TABLET (5 MG TOTAL) BY MOUTH DAILY. 30 tablet 3  . Ascorbic Acid (VITAMIN C) 1000 MG tablet Take 1,000 mg by mouth daily.    . cholecalciferol (VITAMIN D) 1000 UNITS tablet Take 1,000 Units by mouth daily.    . Glucosamine-Chondroitin (GLUCOSAMINE CHONDR COMPLEX PO) Take 2 capsules by mouth daily.    . hydrochlorothiazide (MICROZIDE) 12.5 MG capsule Take 1 capsule (12.5 mg total) by mouth daily. 90 capsule 3  . Multiple Vitamin (MULTIVITAMIN) capsule Take 1 capsule by mouth daily.    . Omega-3 Fatty Acids (FISH OIL) 1200 MG CAPS Take 1,200 mg by mouth daily.    [name] testosterone (ANDROGEL) 50 MG/5GM (1%) GEL Place onto the skin daily.     No current  facility-administered medications on file prior to visit.     Past Medical History  Diagnosis Date  . Hypertension   . H/O prostate cancer     was treated with radiation  . Shingles      Past Surgical History  Procedure Laterality Date  . Abdominal surgery      ruptured intestines      Family History  Problem Relation Age of Onset  . Heart disease Brother   . Heart attack Brother      History   Social History  . Marital Status: Married    Spouse Name: N/A    Number of Children: N/A  . Years of Education: N/A   Occupational History  . retired    Social History Main Topics  . Smoking status: Former Smoker -- 1.00 packs/day for 15 years    Types: Cigarettes    Quit date: 09/14/1965  . Smokeless tobacco: Never Used  . Alcohol Use: 3.5 oz/week    7 drink(s) per week     Comment: daily  . Drug Use: No  . Sexual Activity: Not on file   Other Topics Concern  . Not on file   Social History Narrative     PHYSICAL EXAM   BP 180/68 mmHg  Pulse 64  Ht 5\' 8"  (1.727 m)  Wt 238 lb 8 oz (108.183 kg)  BMI 36.27 kg/m2 Constitutional: He is oriented to person, place, and time. He appears well-developed and well-nourished. No distress.  HENT: No nasal discharge.  Head: Normocephalic and atraumatic.  Eyes: Pupils are equal and round. Right eye exhibits no discharge. Left eye exhibits no discharge.  Neck: Normal range of motion. Neck supple. No JVD present. No thyromegaly present.  Cardiovascular: Normal rate, regular rhythm, normal heart sounds and. Exam reveals no gallop and no friction rub. No murmur heard.  Pulmonary/Chest: Effort normal and breath sounds normal. No stridor. No respiratory distress. He has no wheezes. He has no rales. He exhibits no tenderness.  Abdominal: Soft. Bowel sounds are normal. He exhibits no distension. There is no tenderness. There is no rebound and no guarding.  Musculoskeletal: Normal range of motion. He exhibits +1 edema and no  tenderness.  Neurological: He is alert and oriented to person, place, and time. Coordination normal.  Skin: Skin is warm and dry. No rash noted. He is not diaphoretic. No erythema. No pallor.  Psychiatric: He has a normal mood and affect. His behavior is normal. Judgment and thought content normal.    EKG: Sinus  Rhythm  Low voltage -possible pulmonary disease.   ABNORMAL    ASSESSMENT AND PLAN

## 2014-09-11 NOTE — Assessment & Plan Note (Signed)
Blood pressure is elevated today likely due to excessive sodium intake. He reports that blood pressure readings at home are ranging from 626-948 systolic. I increased the dose of hydrochlorothiazide to 25 mg once daily. Continue treatment with amlodipine. I discussed with him the importance of low sodium diet, exercise and weight loss.

## 2014-09-14 DIAGNOSIS — M9903 Segmental and somatic dysfunction of lumbar region: Secondary | ICD-10-CM | POA: Diagnosis not present

## 2014-09-14 DIAGNOSIS — M5136 Other intervertebral disc degeneration, lumbar region: Secondary | ICD-10-CM | POA: Diagnosis not present

## 2014-09-14 DIAGNOSIS — M9901 Segmental and somatic dysfunction of cervical region: Secondary | ICD-10-CM | POA: Diagnosis not present

## 2014-09-14 DIAGNOSIS — M9905 Segmental and somatic dysfunction of pelvic region: Secondary | ICD-10-CM | POA: Diagnosis not present

## 2014-09-17 DIAGNOSIS — M9903 Segmental and somatic dysfunction of lumbar region: Secondary | ICD-10-CM | POA: Diagnosis not present

## 2014-09-17 DIAGNOSIS — M5416 Radiculopathy, lumbar region: Secondary | ICD-10-CM | POA: Diagnosis not present

## 2014-09-17 DIAGNOSIS — M955 Acquired deformity of pelvis: Secondary | ICD-10-CM | POA: Diagnosis not present

## 2014-09-17 DIAGNOSIS — M9905 Segmental and somatic dysfunction of pelvic region: Secondary | ICD-10-CM | POA: Diagnosis not present

## 2014-09-19 ENCOUNTER — Ambulatory Visit (INDEPENDENT_AMBULATORY_CARE_PROVIDER_SITE_OTHER)
Admission: RE | Admit: 2014-09-19 | Discharge: 2014-09-19 | Disposition: A | Discharge status: Home / Self Care | Payer: BLUE CROSS/BLUE SHIELD | Source: Ambulatory Visit | Attending: Internal Medicine | Admitting: Internal Medicine

## 2014-09-19 ENCOUNTER — Ambulatory Visit (INDEPENDENT_AMBULATORY_CARE_PROVIDER_SITE_OTHER): Payer: BLUE CROSS/BLUE SHIELD | Admitting: Internal Medicine

## 2014-09-19 ENCOUNTER — Other Ambulatory Visit: Payer: Self-pay | Admitting: Internal Medicine

## 2014-09-19 ENCOUNTER — Encounter: Payer: Self-pay | Admitting: Internal Medicine

## 2014-09-19 VITALS — BP 162/88 | HR 66 | Temp 98.2°F | Wt 235.8 lb

## 2014-09-19 DIAGNOSIS — M25551 Pain in right hip: Secondary | ICD-10-CM

## 2014-09-19 DIAGNOSIS — M955 Acquired deformity of pelvis: Secondary | ICD-10-CM | POA: Diagnosis not present

## 2014-09-19 DIAGNOSIS — M9903 Segmental and somatic dysfunction of lumbar region: Secondary | ICD-10-CM | POA: Diagnosis not present

## 2014-09-19 DIAGNOSIS — M160 Bilateral primary osteoarthritis of hip: Secondary | ICD-10-CM | POA: Diagnosis not present

## 2014-09-19 DIAGNOSIS — R21 Rash and other nonspecific skin eruption: Secondary | ICD-10-CM | POA: Diagnosis not present

## 2014-09-19 DIAGNOSIS — M5416 Radiculopathy, lumbar region: Secondary | ICD-10-CM | POA: Diagnosis not present

## 2014-09-19 DIAGNOSIS — M9905 Segmental and somatic dysfunction of pelvic region: Secondary | ICD-10-CM | POA: Diagnosis not present

## 2014-09-19 NOTE — Progress Notes (Signed)
Pre visit review using our clinic review tool, if applicable. No additional management support is needed unless otherwise documented below in the visit note. 

## 2014-09-19 NOTE — Assessment & Plan Note (Addendum)
Told him it was not shingles--but is only on right side Not infectious (not even like tinea versicolor) Reassured doesn't look concerning--but may want to see dermatologist He thinks it may be related to gluten exposure

## 2014-09-19 NOTE — Patient Instructions (Signed)
Please increase the ibuprofen to 2 tabs (400mg ) three times a day with meals. If you are not getting any better by next week, we can try a referral to a physical therapist. Please use 2 canes, or a walker, till you are more stable with your walking.

## 2014-09-19 NOTE — Progress Notes (Signed)
   Subjective:    Patient ID: Cesar Powell, male    DOB: 07-Apr-1931, 81 y.o.   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 Did do a different machine which rotated his body No obvious injury Noticed pain in upper anterior right thigh soon after Really hard to walk--now using a cane Progressively worsening Okay if sitting down ---- can bend down (deep knee bend). Can't bear weight though  Tried 2 ibuprofen once daily for the last 2 days---did seem to help  Also with rash along right flank Started around 4 months ago He thought it was shingles but never vesicles and no pain  Current Outpatient Prescriptions on File Prior to Visit  Medication Sig Dispense Refill  . amLODipine (NORVASC) 5 MG tablet TAKE 1 TABLET (5 MG TOTAL) BY MOUTH DAILY. 30 tablet 3  . Ascorbic Acid (VITAMIN C) 1000 MG tablet Take 1,000 mg by mouth daily.    . cholecalciferol (VITAMIN D) 1000 UNITS tablet Take 1,000 Units by mouth daily.    . Glucosamine-Chondroitin (GLUCOSAMINE CHONDR COMPLEX PO) Take 2 capsules by mouth daily.    . hydrochlorothiazide (HYDRODIURIL) 25 MG tablet Take 1 tablet (25 mg total) by mouth daily. 30 tablet 6  . Multiple Vitamin (MULTIVITAMIN) capsule Take 1 capsule by mouth daily.    . Omega-3 Fatty Acids (FISH OIL) 1200 MG CAPS Take 1,200 mg by mouth daily.    [name] testosterone (ANDROGEL) 50 MG/5GM (1%) GEL Place onto the skin daily.     No current facility-administered medications on file prior to visit.    No Known Allergies  Past Medical History  Diagnosis Date  . Hypertension   . H/O prostate cancer     was treated with radiation  . Shingles     Past Surgical History  Procedure Laterality Date  . Abdominal surgery      ruptured intestines     Family History  Problem Relation Age of Onset  . Heart disease Brother     . Heart attack Brother     History   Social History  . Marital Status: Married    Spouse Name: N/A  . Number of Children: N/A  . Years of Education: N/A   Occupational History  . retired    Social History Main Topics  . Smoking status: Former Smoker -- 1.00 packs/day for 15 years    Types: Cigarettes    Quit date: 09/14/1965  . Smokeless tobacco: Never Used  . Alcohol Use: 3.5 oz/week    7 drink(s) per week     Comment: daily  . Drug Use: No  . Sexual Activity: Not on file   Other Topics Concern  . Not on file   Social History Narrative   Review of Systems No arthritis problems No fever Doesn't feel sick    Objective:   Physical Exam  Musculoskeletal:  No back or spine tenderness No pelvic tenderness Fair ROM of right hip Tenderness along lateral upper right thigh (?piriformis area)  Neurological:  Highly antalgic gait No focal leg weakness  Skin:  Diffuse slightly raised confluent rash along right flank---from ~T10- L3 in back and across lower portion of abdomen No flaking  Not inflamed          Assessment & Plan:

## 2014-09-19 NOTE — Assessment & Plan Note (Addendum)
Acute injury 2 weeks ago which has been worsening No findings that suggest bony injury but needs to be considered X-ray did not show any worrisome features I believe he has bony injury Has been seeing Dr Francisca December for this  Discussed ibuprofen regularly for now Ice If he is not improving in the next few days, can make referral to PT

## 2014-09-23 NOTE — Assessment & Plan Note (Signed)
He is satisfied with AutoPap for his primary CPAP machine. He needs to get his DME to look at his Transcend machine and probably get both devices set to 8-15 AutoPap

## 2014-09-24 ENCOUNTER — Encounter: Payer: Self-pay | Admitting: Family Medicine

## 2014-09-24 DIAGNOSIS — M955 Acquired deformity of pelvis: Secondary | ICD-10-CM | POA: Diagnosis not present

## 2014-09-24 DIAGNOSIS — M9903 Segmental and somatic dysfunction of lumbar region: Secondary | ICD-10-CM | POA: Diagnosis not present

## 2014-09-24 DIAGNOSIS — M5416 Radiculopathy, lumbar region: Secondary | ICD-10-CM | POA: Diagnosis not present

## 2014-09-24 DIAGNOSIS — M9905 Segmental and somatic dysfunction of pelvic region: Secondary | ICD-10-CM | POA: Diagnosis not present

## 2014-09-24 DIAGNOSIS — M25551 Pain in right hip: Secondary | ICD-10-CM

## 2014-09-24 NOTE — Telephone Encounter (Signed)
i will defer to Dr. Silvio Pate, since I have not seen him in 5 years.

## 2014-10-01 DIAGNOSIS — M25551 Pain in right hip: Secondary | ICD-10-CM | POA: Diagnosis not present

## 2014-10-02 DIAGNOSIS — M25551 Pain in right hip: Secondary | ICD-10-CM | POA: Diagnosis not present

## 2014-10-04 DIAGNOSIS — M25551 Pain in right hip: Secondary | ICD-10-CM | POA: Diagnosis not present

## 2014-10-08 ENCOUNTER — Inpatient Hospital Stay (HOSPITAL_COMMUNITY): Payer: Medicare Other

## 2014-10-08 ENCOUNTER — Encounter (HOSPITAL_COMMUNITY): Payer: Self-pay | Admitting: Emergency Medicine

## 2014-10-08 ENCOUNTER — Telehealth: Payer: Self-pay | Admitting: Family Medicine

## 2014-10-08 ENCOUNTER — Inpatient Hospital Stay (HOSPITAL_COMMUNITY)
Admission: EM | Admit: 2014-10-08 | Discharge: 2014-10-11 | DRG: 482 | Disposition: A | Discharge status: Home Health | Payer: Medicare Other | Attending: Internal Medicine | Admitting: Internal Medicine

## 2014-10-08 ENCOUNTER — Encounter (HOSPITAL_COMMUNITY)
Admission: EM | Disposition: A | Discharge status: Home Health | Payer: Self-pay | Source: Home / Self Care | Attending: Internal Medicine

## 2014-10-08 ENCOUNTER — Emergency Department (HOSPITAL_COMMUNITY): Payer: Medicare Other

## 2014-10-08 ENCOUNTER — Other Ambulatory Visit (HOSPITAL_COMMUNITY): Payer: Self-pay

## 2014-10-08 ENCOUNTER — Inpatient Hospital Stay (HOSPITAL_COMMUNITY): Payer: Medicare Other | Admitting: Anesthesiology

## 2014-10-08 DIAGNOSIS — I10 Essential (primary) hypertension: Secondary | ICD-10-CM | POA: Diagnosis not present

## 2014-10-08 DIAGNOSIS — R52 Pain, unspecified: Secondary | ICD-10-CM | POA: Diagnosis not present

## 2014-10-08 DIAGNOSIS — T1490XA Injury, unspecified, initial encounter: Secondary | ICD-10-CM

## 2014-10-08 DIAGNOSIS — Z923 Personal history of irradiation: Secondary | ICD-10-CM | POA: Diagnosis not present

## 2014-10-08 DIAGNOSIS — Z79899 Other long term (current) drug therapy: Secondary | ICD-10-CM

## 2014-10-08 DIAGNOSIS — S7221XA Displaced subtrochanteric fracture of right femur, initial encounter for closed fracture: Secondary | ICD-10-CM | POA: Diagnosis not present

## 2014-10-08 DIAGNOSIS — S7291XA Unspecified fracture of right femur, initial encounter for closed fracture: Secondary | ICD-10-CM | POA: Diagnosis not present

## 2014-10-08 DIAGNOSIS — C61 Malignant neoplasm of prostate: Secondary | ICD-10-CM

## 2014-10-08 DIAGNOSIS — Z419 Encounter for procedure for purposes other than remedying health state, unspecified: Secondary | ICD-10-CM

## 2014-10-08 DIAGNOSIS — M79651 Pain in right thigh: Secondary | ICD-10-CM | POA: Diagnosis not present

## 2014-10-08 DIAGNOSIS — S72001A Fracture of unspecified part of neck of right femur, initial encounter for closed fracture: Secondary | ICD-10-CM | POA: Diagnosis not present

## 2014-10-08 DIAGNOSIS — Z87891 Personal history of nicotine dependence: Secondary | ICD-10-CM

## 2014-10-08 DIAGNOSIS — S7223XA Displaced subtrochanteric fracture of unspecified femur, initial encounter for closed fracture: Secondary | ICD-10-CM | POA: Diagnosis present

## 2014-10-08 DIAGNOSIS — S72301A Unspecified fracture of shaft of right femur, initial encounter for closed fracture: Secondary | ICD-10-CM | POA: Diagnosis not present

## 2014-10-08 DIAGNOSIS — Y93E1 Activity, personal bathing and showering: Secondary | ICD-10-CM | POA: Diagnosis not present

## 2014-10-08 DIAGNOSIS — M7989 Other specified soft tissue disorders: Secondary | ICD-10-CM | POA: Diagnosis not present

## 2014-10-08 DIAGNOSIS — J90 Pleural effusion, not elsewhere classified: Secondary | ICD-10-CM | POA: Diagnosis not present

## 2014-10-08 DIAGNOSIS — Y92002 Bathroom of unspecified non-institutional (private) residence single-family (private) house as the place of occurrence of the external cause: Secondary | ICD-10-CM | POA: Diagnosis not present

## 2014-10-08 DIAGNOSIS — W19XXXA Unspecified fall, initial encounter: Secondary | ICD-10-CM | POA: Diagnosis present

## 2014-10-08 DIAGNOSIS — M79604 Pain in right leg: Secondary | ICD-10-CM | POA: Diagnosis not present

## 2014-10-08 DIAGNOSIS — S72141A Displaced intertrochanteric fracture of right femur, initial encounter for closed fracture: Secondary | ICD-10-CM | POA: Diagnosis not present

## 2014-10-08 DIAGNOSIS — S7221XD Displaced subtrochanteric fracture of right femur, subsequent encounter for closed fracture with routine healing: Secondary | ICD-10-CM | POA: Diagnosis not present

## 2014-10-08 DIAGNOSIS — Z8546 Personal history of malignant neoplasm of prostate: Secondary | ICD-10-CM | POA: Diagnosis not present

## 2014-10-08 DIAGNOSIS — Z09 Encounter for follow-up examination after completed treatment for conditions other than malignant neoplasm: Secondary | ICD-10-CM

## 2014-10-08 HISTORY — PX: FEMUR IM NAIL: SHX1597

## 2014-10-08 LAB — CBC WITH DIFFERENTIAL/PLATELET
BASOS ABS: 0 10*3/uL (ref 0.0–0.1)
Basophils Relative: 0 % (ref 0–1)
EOS ABS: 0 10*3/uL (ref 0.0–0.7)
EOS PCT: 0 % (ref 0–5)
HCT: 46.1 % (ref 39.0–52.0)
HEMOGLOBIN: 15.8 g/dL (ref 13.0–17.0)
LYMPHS PCT: 7 % — AB (ref 12–46)
Lymphs Abs: 0.8 10*3/uL (ref 0.7–4.0)
MCH: 31.3 pg (ref 26.0–34.0)
MCHC: 34.3 g/dL (ref 30.0–36.0)
MCV: 91.5 fL (ref 78.0–100.0)
Monocytes Absolute: 0.6 10*3/uL (ref 0.1–1.0)
Monocytes Relative: 6 % (ref 3–12)
Neutro Abs: 10.1 10*3/uL — ABNORMAL HIGH (ref 1.7–7.7)
Neutrophils Relative %: 87 % — ABNORMAL HIGH (ref 43–77)
Platelets: 192 10*3/uL (ref 150–400)
RBC: 5.04 MIL/uL (ref 4.22–5.81)
RDW: 14.3 % (ref 11.5–15.5)
WBC: 11.5 10*3/uL — ABNORMAL HIGH (ref 4.0–10.5)

## 2014-10-08 LAB — BASIC METABOLIC PANEL
ANION GAP: 10 (ref 5–15)
BUN: 21 mg/dL (ref 6–23)
CALCIUM: 9.2 mg/dL (ref 8.4–10.5)
CO2: 27 mmol/L (ref 19–32)
Chloride: 99 mmol/L (ref 96–112)
Creatinine, Ser: 1.23 mg/dL (ref 0.50–1.35)
GFR calc Af Amer: 61 mL/min — ABNORMAL LOW (ref >90)
GFR, EST NON AFRICAN AMERICAN: 53 mL/min — AB (ref >90)
Glucose, Bld: 142 mg/dL — ABNORMAL HIGH (ref 70–99)
POTASSIUM: 3.6 mmol/L (ref 3.5–5.1)
SODIUM: 136 mmol/L (ref 135–145)

## 2014-10-08 LAB — ABO/RH: ABO/RH(D): A POS

## 2014-10-08 LAB — PROTIME-INR
INR: 1.1 (ref 0.00–1.49)
PROTHROMBIN TIME: 14.4 s (ref 11.6–15.2)

## 2014-10-08 LAB — TYPE AND SCREEN
ABO/RH(D): A POS
Antibody Screen: NEGATIVE

## 2014-10-08 SURGERY — INSERTION, INTRAMEDULLARY ROD, FEMUR
Anesthesia: General | Laterality: Right

## 2014-10-08 MED ORDER — VITAMIN D 1000 UNITS PO TABS
1000.0000 [IU] | ORAL_TABLET | Freq: Every day | ORAL | Status: DC
  Administered 2014-10-09 – 2014-10-11 (×4): 1000 [IU] via ORAL
  Filled 2014-10-08 (×3): qty 1

## 2014-10-08 MED ORDER — FENTANYL CITRATE 0.05 MG/ML IJ SOLN
INTRAMUSCULAR | Status: AC
  Filled 2014-10-08: qty 2

## 2014-10-08 MED ORDER — LIDOCAINE HCL (CARDIAC) 20 MG/ML IV SOLN
INTRAVENOUS | Status: DC | PRN
  Administered 2014-10-08: 100 mg via INTRAVENOUS

## 2014-10-08 MED ORDER — NEOSTIGMINE METHYLSULFATE 10 MG/10ML IV SOLN
INTRAVENOUS | Status: AC
  Filled 2014-10-08: qty 1

## 2014-10-08 MED ORDER — CEFAZOLIN SODIUM-DEXTROSE 2-3 GM-% IV SOLR
2.0000 g | Freq: Four times a day (QID) | INTRAVENOUS | Status: AC
  Administered 2014-10-08 – 2014-10-09 (×2): 2 g via INTRAVENOUS
  Filled 2014-10-08 (×2): qty 50

## 2014-10-08 MED ORDER — ROCURONIUM BROMIDE 100 MG/10ML IV SOLN
INTRAVENOUS | Status: DC | PRN
  Administered 2014-10-08: 40 mg via INTRAVENOUS
  Administered 2014-10-08: 20 mg via INTRAVENOUS
  Administered 2014-10-08: 10 mg via INTRAVENOUS

## 2014-10-08 MED ORDER — DOCUSATE SODIUM 100 MG PO CAPS
100.0000 mg | ORAL_CAPSULE | Freq: Two times a day (BID) | ORAL | Status: DC
  Administered 2014-10-08 – 2014-10-11 (×6): 100 mg via ORAL
  Filled 2014-10-08 (×6): qty 1

## 2014-10-08 MED ORDER — ONDANSETRON HCL 4 MG/2ML IJ SOLN
INTRAMUSCULAR | Status: AC
  Filled 2014-10-08: qty 4

## 2014-10-08 MED ORDER — SODIUM CHLORIDE 0.9 % IV SOLN
Freq: Once | INTRAVENOUS | Status: AC
  Administered 2014-10-08: 11:00:00 via INTRAVENOUS

## 2014-10-08 MED ORDER — ONDANSETRON HCL 4 MG PO TABS: 4.0000 mg | ORAL_TABLET | Freq: Four times a day (QID) | ORAL | Status: DC | PRN

## 2014-10-08 MED ORDER — MENTHOL 3 MG MT LOZG: 1.0000 | LOZENGE | OROMUCOSAL | Status: DC | PRN

## 2014-10-08 MED ORDER — METOCLOPRAMIDE HCL 10 MG PO TABS: 5.0000 mg | ORAL_TABLET | Freq: Three times a day (TID) | ORAL | Status: DC | PRN

## 2014-10-08 MED ORDER — FENTANYL CITRATE 0.05 MG/ML IJ SOLN
INTRAMUSCULAR | Status: DC | PRN
  Administered 2014-10-08: 50 ug via INTRAVENOUS
  Administered 2014-10-08: 100 ug via INTRAVENOUS
  Administered 2014-10-08 (×2): 50 ug via INTRAVENOUS

## 2014-10-08 MED ORDER — NEOSTIGMINE METHYLSULFATE 10 MG/10ML IV SOLN
INTRAVENOUS | Status: DC | PRN
  Administered 2014-10-08: 5 mg via INTRAVENOUS

## 2014-10-08 MED ORDER — SENNA 8.6 MG PO TABS
1.0000 | ORAL_TABLET | Freq: Two times a day (BID) | ORAL | Status: DC
Start: 2014-10-08 — End: 2014-10-08

## 2014-10-08 MED ORDER — PHENOL 1.4 % MT LIQD: 1.0000 | OROMUCOSAL | Status: DC | PRN

## 2014-10-08 MED ORDER — VITAMIN C 500 MG PO TABS
1000.0000 mg | ORAL_TABLET | Freq: Every day | ORAL | Status: DC
  Administered 2014-10-09 – 2014-10-11 (×3): 1000 mg via ORAL
  Filled 2014-10-08 (×3): qty 2

## 2014-10-08 MED ORDER — SENNA 8.6 MG PO TABS
1.0000 | ORAL_TABLET | Freq: Two times a day (BID) | ORAL | Status: DC
  Administered 2014-10-08 – 2014-10-11 (×6): 8.6 mg via ORAL
  Filled 2014-10-08 (×6): qty 1

## 2014-10-08 MED ORDER — MIDAZOLAM HCL 5 MG/5ML IJ SOLN
INTRAMUSCULAR | Status: DC | PRN
  Administered 2014-10-08 (×2): 1 mg via INTRAVENOUS
  Administered 2014-10-08: 0.5 mg via INTRAVENOUS

## 2014-10-08 MED ORDER — METHOCARBAMOL 500 MG PO TABS: 500.0000 mg | ORAL_TABLET | Freq: Four times a day (QID) | ORAL | Status: DC | PRN

## 2014-10-08 MED ORDER — OMEGA-3-ACID ETHYL ESTERS 1 G PO CAPS
1.0000 g | ORAL_CAPSULE | Freq: Every day | ORAL | Status: DC
Start: 2014-10-08 — End: 2014-10-11
  Administered 2014-10-09 – 2014-10-11 (×3): 1 g via ORAL
  Filled 2014-10-08 (×3): qty 1

## 2014-10-08 MED ORDER — ROCURONIUM BROMIDE 50 MG/5ML IV SOLN
INTRAVENOUS | Status: AC
  Filled 2014-10-08: qty 2

## 2014-10-08 MED ORDER — ONDANSETRON HCL 4 MG/2ML IJ SOLN: 4.0000 mg | Freq: Four times a day (QID) | INTRAMUSCULAR | Status: DC | PRN

## 2014-10-08 MED ORDER — METHOCARBAMOL 1000 MG/10ML IJ SOLN
500.0000 mg | Freq: Four times a day (QID) | INTRAVENOUS | Status: DC | PRN
  Filled 2014-10-08: qty 5

## 2014-10-08 MED ORDER — ACETAMINOPHEN 10 MG/ML IV SOLN
1000.0000 mg | INTRAVENOUS | Status: AC
  Administered 2014-10-08: 1000 mg via INTRAVENOUS
  Filled 2014-10-08: qty 100

## 2014-10-08 MED ORDER — CEFAZOLIN SODIUM-DEXTROSE 2-3 GM-% IV SOLR
INTRAVENOUS | Status: DC | PRN
  Administered 2014-10-08: 2 g via INTRAVENOUS

## 2014-10-08 MED ORDER — ADULT MULTIVITAMIN W/MINERALS CH
1.0000 | ORAL_TABLET | Freq: Every day | ORAL | Status: DC
  Administered 2014-10-09 – 2014-10-11 (×3): 1 via ORAL
  Filled 2014-10-08 (×3): qty 1

## 2014-10-08 MED ORDER — FENTANYL CITRATE 0.05 MG/ML IJ SOLN
100.0000 ug | INTRAMUSCULAR | Status: DC | PRN
  Administered 2014-10-08: 100 ug via INTRAVENOUS
  Filled 2014-10-08: qty 2

## 2014-10-08 MED ORDER — MIDAZOLAM HCL 2 MG/2ML IJ SOLN
INTRAMUSCULAR | Status: AC
  Filled 2014-10-08: qty 2

## 2014-10-08 MED ORDER — PROPOFOL 10 MG/ML IV BOLUS
INTRAVENOUS | Status: DC | PRN
  Administered 2014-10-08: 40 mg via INTRAVENOUS
  Administered 2014-10-08: 20 mg via INTRAVENOUS
  Administered 2014-10-08: 180 mg via INTRAVENOUS

## 2014-10-08 MED ORDER — DEXTROSE 5 % IV SOLN
INTRAVENOUS | Status: DC | PRN
  Administered 2014-10-08: 14:00:00 via INTRAVENOUS

## 2014-10-08 MED ORDER — ONDANSETRON HCL 4 MG/2ML IJ SOLN
INTRAMUSCULAR | Status: DC | PRN
  Administered 2014-10-08: 4 mg via INTRAVENOUS

## 2014-10-08 MED ORDER — MORPHINE SULFATE 2 MG/ML IJ SOLN: 0.5000 mg | INTRAMUSCULAR | Status: DC | PRN

## 2014-10-08 MED ORDER — HYDROCHLOROTHIAZIDE 25 MG PO TABS
25.0000 mg | ORAL_TABLET | Freq: Every day | ORAL | Status: DC
  Administered 2014-10-09 – 2014-10-11 (×3): 25 mg via ORAL
  Filled 2014-10-08 (×2): qty 1

## 2014-10-08 MED ORDER — FENTANYL CITRATE 0.05 MG/ML IJ SOLN
INTRAMUSCULAR | Status: AC
  Filled 2014-10-08: qty 5

## 2014-10-08 MED ORDER — METHOCARBAMOL 500 MG PO TABS
500.0000 mg | ORAL_TABLET | Freq: Four times a day (QID) | ORAL | Status: DC | PRN
  Administered 2014-10-10 – 2014-10-11 (×4): 500 mg via ORAL
  Filled 2014-10-08 (×4): qty 1

## 2014-10-08 MED ORDER — 0.9 % SODIUM CHLORIDE (POUR BTL) OPTIME
TOPICAL | Status: DC | PRN
  Administered 2014-10-08: 1000 mL

## 2014-10-08 MED ORDER — GLYCOPYRROLATE 0.2 MG/ML IJ SOLN
INTRAMUSCULAR | Status: DC | PRN
  Administered 2014-10-08: .8 mg via INTRAVENOUS

## 2014-10-08 MED ORDER — ARTIFICIAL TEARS OP OINT
TOPICAL_OINTMENT | OPHTHALMIC | Status: DC | PRN
  Administered 2014-10-08: 1 via OPHTHALMIC

## 2014-10-08 MED ORDER — ARTIFICIAL TEARS OP OINT
TOPICAL_OINTMENT | OPHTHALMIC | Status: AC
  Filled 2014-10-08: qty 3.5

## 2014-10-08 MED ORDER — LACTATED RINGERS IV SOLN
INTRAVENOUS | Status: DC | PRN
  Administered 2014-10-08: 15:00:00 via INTRAVENOUS

## 2014-10-08 MED ORDER — SODIUM CHLORIDE 0.9 % IV SOLN
INTRAVENOUS | Status: DC | PRN
  Administered 2014-10-08: 12:00:00 via INTRAVENOUS

## 2014-10-08 MED ORDER — GLYCOPYRROLATE 0.2 MG/ML IJ SOLN
INTRAMUSCULAR | Status: AC
  Filled 2014-10-08: qty 4

## 2014-10-08 MED ORDER — LACTATED RINGERS IV SOLN
INTRAVENOUS | Status: DC
  Administered 2014-10-08: 18:00:00 via INTRAVENOUS

## 2014-10-08 MED ORDER — ALUM & MAG HYDROXIDE-SIMETH 200-200-20 MG/5ML PO SUSP: 30.0000 mL | ORAL | Status: DC | PRN

## 2014-10-08 MED ORDER — PROMETHAZINE HCL 25 MG/ML IJ SOLN: 6.2500 mg | INTRAMUSCULAR | Status: DC | PRN

## 2014-10-08 MED ORDER — ACETAMINOPHEN 325 MG PO TABS: 650.0000 mg | ORAL_TABLET | Freq: Four times a day (QID) | ORAL | Status: DC | PRN

## 2014-10-08 MED ORDER — ACETAMINOPHEN 650 MG RE SUPP: 650.0000 mg | Freq: Four times a day (QID) | RECTAL | Status: DC | PRN

## 2014-10-08 MED ORDER — FENTANYL CITRATE 0.05 MG/ML IJ SOLN
25.0000 ug | INTRAMUSCULAR | Status: DC | PRN
  Administered 2014-10-08: 50 ug via INTRAVENOUS

## 2014-10-08 MED ORDER — PROPOFOL 10 MG/ML IV BOLUS
INTRAVENOUS | Status: AC
  Filled 2014-10-08: qty 20

## 2014-10-08 MED ORDER — HYDROCODONE-ACETAMINOPHEN 5-325 MG PO TABS
1.0000 | ORAL_TABLET | Freq: Four times a day (QID) | ORAL | Status: DC | PRN
  Administered 2014-10-09: 1 via ORAL
  Administered 2014-10-09: 2 via ORAL
  Administered 2014-10-09: 1 via ORAL
  Administered 2014-10-10 – 2014-10-11 (×5): 2 via ORAL
  Filled 2014-10-08 (×2): qty 2
  Filled 2014-10-08: qty 1
  Filled 2014-10-08 (×2): qty 2
  Filled 2014-10-08: qty 1
  Filled 2014-10-08 (×2): qty 2

## 2014-10-08 MED ORDER — MULTIVITAMINS PO CAPS: 1.0000 | ORAL_CAPSULE | Freq: Every day | ORAL | Status: DC

## 2014-10-08 MED ORDER — FISH OIL 1200 MG PO CAPS: 1200.0000 mg | ORAL_CAPSULE | Freq: Every day | ORAL | Status: DC

## 2014-10-08 MED ORDER — HEPARIN SODIUM (PORCINE) 5000 UNIT/ML IJ SOLN
5000.0000 [IU] | Freq: Three times a day (TID) | INTRAMUSCULAR | Status: DC
  Administered 2014-10-08 – 2014-10-09 (×2): 5000 [IU] via SUBCUTANEOUS
  Filled 2014-10-08 (×2): qty 1

## 2014-10-08 MED ORDER — METHOCARBAMOL 1000 MG/10ML IJ SOLN: 500.0000 mg | Freq: Four times a day (QID) | INTRAMUSCULAR | Status: DC | PRN

## 2014-10-08 MED ORDER — HYDROCODONE-ACETAMINOPHEN 5-325 MG PO TABS: 1.0000 | ORAL_TABLET | Freq: Four times a day (QID) | ORAL | Status: DC | PRN

## 2014-10-08 MED ORDER — AMLODIPINE BESYLATE 5 MG PO TABS
5.0000 mg | ORAL_TABLET | Freq: Every day | ORAL | Status: DC
  Administered 2014-10-09 – 2014-10-11 (×3): 5 mg via ORAL
  Filled 2014-10-08 (×3): qty 1

## 2014-10-08 MED ORDER — METOCLOPRAMIDE HCL 5 MG/ML IJ SOLN: 5.0000 mg | Freq: Three times a day (TID) | INTRAMUSCULAR | Status: DC | PRN

## 2014-10-08 MED ORDER — LIDOCAINE HCL (CARDIAC) 20 MG/ML IV SOLN
INTRAVENOUS | Status: AC
  Filled 2014-10-08: qty 5

## 2014-10-08 MED ORDER — MEPERIDINE HCL 25 MG/ML IJ SOLN: 6.2500 mg | INTRAMUSCULAR | Status: DC | PRN

## 2014-10-08 MED ORDER — ENOXAPARIN SODIUM 40 MG/0.4ML ~~LOC~~ SOLN: 40.0000 mg | SUBCUTANEOUS | Status: DC

## 2014-10-08 SURGICAL SUPPLY — 49 items
ALCOHOL ISOPROPYL (RUBBING) (MISCELLANEOUS) IMPLANT
BIT DRILL AO GAMMA 4.2X180 (BIT) ×3 IMPLANT
CHLORAPREP W/TINT 26ML (MISCELLANEOUS) ×3 IMPLANT
COVER PERINEAL POST (MISCELLANEOUS) ×3 IMPLANT
COVER SURGICAL LIGHT HANDLE (MISCELLANEOUS) ×3 IMPLANT
DERMABOND ADVANCED (GAUZE/BANDAGES/DRESSINGS)
DERMABOND ADVANCED .7 DNX12 (GAUZE/BANDAGES/DRESSINGS) IMPLANT
DRAPE C-ARM 42X72 X-RAY (DRAPES) ×3 IMPLANT
DRAPE C-ARMOR (DRAPES) ×3 IMPLANT
DRAPE IMP U-DRAPE 54X76 (DRAPES) ×6 IMPLANT
DRAPE UNIVERSAL PACK (DRAPES) ×3 IMPLANT
DRSG MEPILEX BORDER 4X4 (GAUZE/BANDAGES/DRESSINGS) ×6 IMPLANT
DRSG MEPILEX BORDER 4X8 (GAUZE/BANDAGES/DRESSINGS) ×3 IMPLANT
ELECT REM PT RETURN 9FT ADLT (ELECTROSURGICAL) ×3
ELECTRODE REM PT RTRN 9FT ADLT (ELECTROSURGICAL) ×1 IMPLANT
GLOVE BIOGEL PI IND STRL 8.5 (GLOVE) ×2 IMPLANT
GLOVE BIOGEL PI INDICATOR 8.5 (GLOVE) ×4
GLOVE SURG ORTHO 8.5 STRL (GLOVE) ×3 IMPLANT
GOWN STRL REUS W/ TWL LRG LVL3 (GOWN DISPOSABLE) ×2 IMPLANT
GOWN STRL REUS W/TWL 2XL LVL3 (GOWN DISPOSABLE) ×3 IMPLANT
GOWN STRL REUS W/TWL LRG LVL3 (GOWN DISPOSABLE) ×4
GUIDEROD T2 3X1000 (ROD) ×3 IMPLANT
K-WIRE  3.2X450M STR (WIRE) ×2
K-WIRE 3.2X450M STR (WIRE) ×1
KIT NAIL LONG 10X380MMX125 (Nail) ×3 IMPLANT
KIT ROOM TURNOVER OR (KITS) ×3 IMPLANT
KWIRE 3.2X450M STR (WIRE) ×1 IMPLANT
LINER BOOT UNIVERSAL DISP (MISCELLANEOUS) ×3 IMPLANT
LIQUID BAND (GAUZE/BANDAGES/DRESSINGS) ×6 IMPLANT
MANIFOLD NEPTUNE II (INSTRUMENTS) ×3 IMPLANT
NS IRRIG 1000ML POUR BTL (IV SOLUTION) ×3 IMPLANT
PACK GENERAL/GYN (CUSTOM PROCEDURE TRAY) ×3 IMPLANT
PAD ARMBOARD 7.5X6 YLW CONV (MISCELLANEOUS) ×6 IMPLANT
REAMER SHAFT BIXCUT (INSTRUMENTS) ×3 IMPLANT
SCREW LAG GAMMA 3 TI 10.5X105M (Screw) ×3 IMPLANT
SCREW LOCKING T2 F/T  5MMX40MM (Screw) ×2 IMPLANT
SCREW LOCKING T2 F/T  5X42.5MM (Screw) ×2 IMPLANT
SCREW LOCKING T2 F/T 5MMX40MM (Screw) ×1 IMPLANT
SCREW LOCKING T2 F/T 5X42.5MM (Screw) ×1 IMPLANT
SUT ETHIBOND NAB CT1 #1 30IN (SUTURE) IMPLANT
SUT MNCRL AB 3-0 PS2 18 (SUTURE) ×6 IMPLANT
SUT MNCRL AB 3-0 PS2 27 (SUTURE) ×3 IMPLANT
SUT MON AB 2-0 CT1 36 (SUTURE) ×6 IMPLANT
SUT VIC AB 1 CT1 27 (SUTURE) ×4
SUT VIC AB 1 CT1 27XBRD ANBCTR (SUTURE) ×2 IMPLANT
SUT VIC AB 2-0 CT1 27 (SUTURE) ×2
SUT VIC AB 2-0 CT1 TAPERPNT 27 (SUTURE) ×1 IMPLANT
TOWEL OR 17X24 6PK STRL BLUE (TOWEL DISPOSABLE) ×3 IMPLANT
TOWEL OR 17X26 10 PK STRL BLUE (TOWEL DISPOSABLE) ×3 IMPLANT

## 2014-10-08 NOTE — Anesthesia Procedure Notes (Signed)
Procedure Name: Intubation Date/Time: 10/08/2014 1:45 PM Performed by: Terrill Mohr Pre-anesthesia Checklist: Patient identified, Emergency Drugs available, Suction available and Patient being monitored Patient Re-evaluated:Patient Re-evaluated prior to inductionOxygen Delivery Method: Circle system utilized Preoxygenation: Pre-oxygenation with 100% oxygen Intubation Type: IV induction Ventilation: Mask ventilation without difficulty Laryngoscope Size: Mac and 4 Grade View: Grade II Tube size: 8.0 mm Number of attempts: 1 Airway Equipment and Method: Stylet Placement Confirmation: ETT inserted through vocal cords under direct vision,  breath sounds checked- equal and bilateral and positive ETCO2 Secured at: 21 (cm at teeth) cm Tube secured with: Tape Dental Injury: Teeth and Oropharynx as per pre-operative assessment

## 2014-10-08 NOTE — ED Notes (Signed)
Patient transported to X-ray 

## 2014-10-08 NOTE — ED Notes (Signed)
Attempted report, floor stated have to figure out what nurse will receive the patient.

## 2014-10-08 NOTE — Consult Note (Addendum)
ORTHOPAEDIC CONSULTATION  REQUESTING PHYSICIAN: Jonetta Osgood, MD  PCP:  Owens Loffler, MD  Chief Complaint: Right thigh pain, inability to ambulate  HPI: Cesar Powell is a 81 y.o. male who complains of Right thigh pain, inability to ambulate. Patient states that he developed lateral thigh pain January which was diagnosed as IT band syndrome/troch bursitis. He has been going to PT. One week ago in [place] while visiting his daughter, he fell and injured his right hip and sustained a black eye. His pain worsened. The patient fell in the shower this morning when his right leg "gave out." Brought to ED where X-rays showed a subtrochanteric femur fx. Was evaluated by hospitalist and underwent perioperative risk stratification. Denies other injuries. Denies CP/SOB.  Past Medical History  Diagnosis Date  . Hypertension   . H/O prostate cancer     was treated with radiation  . Shingles    Past Surgical History  Procedure Laterality Date  . Abdominal surgery      ruptured intestines    History   Social History  . Marital Status: Married    Spouse Name: N/A  . Number of Children: N/A  . Years of Education: N/A   Occupational History  . retired    Social History Main Topics  . Smoking status: Former Smoker -- 1.00 packs/day for 15 years    Types: Cigarettes    Quit date: 09/14/1965  . Smokeless tobacco: Never Used  . Alcohol Use: 3.5 oz/week    7 drink(s) per week     Comment: daily  . Drug Use: No  . Sexual Activity: Not on file   Other Topics Concern  . None   Social History Narrative   Family History  Problem Relation Age of Onset  . Heart disease Brother   . Heart attack Brother    No Known Allergies Prior to Admission medications   Medication Sig Start Date End Date Taking? Authorizing Provider  amLODipine (NORVASC) 5 MG tablet TAKE 1 TABLET (5 MG TOTAL) BY MOUTH DAILY. 08/06/14  Yes Wellington Hampshire, MD  Ascorbic Acid (VITAMIN C) 1000 MG tablet Take 1,000  mg by mouth daily.   Yes Historical Provider, MD  AXIRON 30 MG/ACT SOLN Apply 1 application topically daily. 09/11/14  Yes Historical Provider, MD  cholecalciferol (VITAMIN D) 1000 UNITS tablet Take 1,000 Units by mouth daily.   Yes Historical Provider, MD  Glucosamine-Chondroitin (GLUCOSAMINE CHONDR COMPLEX PO) Take 2 capsules by mouth daily.   Yes Historical Provider, MD  hydrochlorothiazide (HYDRODIURIL) 25 MG tablet Take 1 tablet (25 mg total) by mouth daily. 09/10/14  Yes Wellington Hampshire, MD  ibuprofen (ADVIL,MOTRIN) 200 MG tablet Take 400 mg by mouth every 6 (six) hours as needed for mild pain or moderate pain.   Yes Historical Provider, MD  Multiple Vitamin (MULTIVITAMIN) capsule Take 1 capsule by mouth daily.   Yes Historical Provider, MD  Omega-3 Fatty Acids (FISH OIL) 1200 MG CAPS Take 1,200 mg by mouth daily.   Yes Historical Provider, MD   Dg Chest 1 View  10/08/2014   CLINICAL DATA:  Fall. Right leg injury. Hypertension. History of prostate cancer. Preoperative for right hip fracture.  EXAM: CHEST  1 VIEW  COMPARISON:  None.  FINDINGS: Mild enlargement of the cardiopericardial silhouette, without edema. Thoracic spondylosis. The lungs appear clear. No pleural effusion.  Fragmented spur from the left inferior glenoid. non-fragmented spurring of the right inferior glenoid.  IMPRESSION: 1. Mild enlargement of the  cardiopericardial silhouette, without edema. 2. Thoracic spondylosis 3. Bilateral inferior glenoid spurring, with chronic fragmentation of the left inferior glenoid spur   Electronically Signed   By: Van Clines M.D.   On: 10/08/2014 09:46   Dg Hip Unilat  With Pelvis 2-3 Views Right  10/08/2014   CLINICAL DATA:  Fall in shower this morning. Proximal right femur pain.  EXAM: RIGHT HIP (WITH PELVIS) 2-3 VIEWS  COMPARISON:  09/19/2014  FINDINGS: There is a fracture through the proximal shaft of the right femur, just below the intertrochanteric region. There is angulation with medial  displacement of the femoral shaft relative to the femoral neck. No subluxation or dislocation. Mild degenerative changes in the hips bilaterally.  IMPRESSION: Proximal right femoral shaft fracture just inferior to the intertrochanteric region with varus angulation and displacement.   Electronically Signed   By: Rolm Baptise M.D.   On: 10/08/2014 09:44    Positive ROS: All other systems have been reviewed and were otherwise negative with the exception of those mentioned in the HPI and as above.  Physical Exam: General: Alert, no acute distress Cardiovascular: No pedal edema Respiratory: No cyanosis, no use of accessory musculature GI: No organomegaly, abdomen is soft and non-tender Skin: No lesions in the area of chief complaint Neurologic: Sensation intact distally Psychiatric: Patient is competent for consent with normal mood and affect Lymphatic: No axillary or cervical lymphadenopathy  MUSCULOSKELETAL: RLE is shortened and externally rotated. Skin intact. +TTP proximal femur. 2+ DP. SILT. +TA/GS/EHL.  Assessment: R subtrochanteric femur fracture. Likely had occult fracture from previous fall that propagated.  Plan: To OR for open reduction, IM nail fixation The risks, benefits, and alternatives were discussed with the patient. There are risks associated with the surgery including, but not limited to, problems with anesthesia (death), infection, differences in leg length/angulation/rotation, fracture of bones, failure of implants, hematoma (blood accumulation) which may require surgical drainage, blood clots, pulmonary embolism, nerve injury (foot drop), and blood vessel injury. The patient understands these risks and elects to proceed. He will be TDWB for a minimum of 6 weeks We discussed chemical DVT ppx   Elie Goody, MD Cell [phone]    10/08/2014 1:35 PM

## 2014-10-08 NOTE — Brief Op Note (Signed)
10/08/2014  4:07 PM  PATIENT:  [name]  81 y.o. male  PRE-OPERATIVE DIAGNOSIS:  RIGHT FEMUR FRACTURE  POST-OPERATIVE DIAGNOSIS:  RIGHT FEMUR FRACTURE  PROCEDURE:  Procedure(s) with comments: INTRAMEDULLARY (IM) NAIL FEMORAL (Right) - PER DANIELLE 110 MIN  SURGEON:  Surgeon(s) and Role:    * Rod Can, MD - Primary  PHYSICIAN ASSISTANT: none  ASSISTANTS: Ky Barban, RNFA   ANESTHESIA:   general  EBL:  Total I/O In: 1050 [I.V.:1050] Out: 650 [Urine:550; Blood:100]  BLOOD ADMINISTERED:none  DRAINS: none   LOCAL MEDICATIONS USED:  NONE  SPECIMEN:  No Specimen  DISPOSITION OF SPECIMEN:  N/A  COUNTS:  YES  TOURNIQUET:  * No tourniquets in log *  DICTATION: .Other Dictation: Dictation Number pending  PLAN OF CARE: Admit to inpatient   PATIENT DISPOSITION:  PACU - hemodynamically stable.   Delay start of Pharmacological VTE agent (>24hrs) due to surgical blood loss or risk of bleeding: not applicable

## 2014-10-08 NOTE — ED Notes (Signed)
Per EMS: pt from home for eval of fall in shower this morning, pt states right leg "gave out". Pt denies any LOC, or any head injury. Pt states fall last week and xray of right leg was inconclusive to injury. Deformity noted to right hip at this time, shortenting also noted to right leg. Pt given 100 mcg of fentanyl en route. NAD noted. Distal pulses present, pt axo x4.

## 2014-10-08 NOTE — ED Notes (Signed)
Pt placed in gown and in bed. Pt monitored by pulse ox, bp cuff, and 12-lead. 

## 2014-10-08 NOTE — ED Notes (Signed)
Pt offered pain medication at this time, states he is comfortable with no moving, declined meds at this time .

## 2014-10-08 NOTE — ED Notes (Signed)
Valuables with son.

## 2014-10-08 NOTE — Transfer of Care (Signed)
Immediate Anesthesia Transfer of Care Note  Patient: Cesar Powell  Procedure(s) Performed: Procedure(s) with comments: INTRAMEDULLARY (IM) NAIL FEMORAL (Right) - PER DANIELLE 110 MIN  Patient Location: PACU  Anesthesia Type:General  Level of Consciousness: awake, oriented, patient cooperative and lethargic  Airway & Oxygen Therapy: Patient Spontanous Breathing and Patient connected to nasal cannula oxygen  Post-op Assessment: Report given to RN, Post -op Vital signs reviewed and stable and Patient moving all extremities X 4  Post vital signs: Reviewed and stable   Complications: No apparent anesthesia complications

## 2014-10-08 NOTE — Anesthesia Preprocedure Evaluation (Addendum)
Anesthesia Evaluation  Patient identified by MRN, date of birth, ID band Patient awake    Reviewed: Allergy & Precautions, NPO status , Patient's Chart, lab work & pertinent test results  Airway Mallampati: II  TM Distance: >3 FB Neck ROM: Full    Dental  (+) Teeth Intact, Caps, Dental Advisory Given   Pulmonary sleep apnea and Continuous Positive Airway Pressure Ventilation , former smoker,  Pt describes surgery for sleep apnea 20 years ago (?UPP?).  He sees a cardiologist to manage hypertension since it was discovered during therapy for prostate cancer.         Cardiovascular hypertension, Pt. on medications  STRESS 2013 normal, EF 55%, cleared By IM without further WU requested.  Patient very active, GYM x 3 each week   Neuro/Psych    GI/Hepatic negative GI ROS, Neg liver ROS,   Endo/Other  negative endocrine ROS  Renal/GU GFR 67     Musculoskeletal   Abdominal   Peds  Hematology 15/46   Anesthesia Other Findings Lots of gold work in upper teeth  Reproductive/Obstetrics                          Anesthesia Physical Anesthesia Plan  ASA: II  Anesthesia Plan: General   Post-op Pain Management:    Induction: Intravenous  Airway Management Planned: Oral ETT  Additional Equipment:   Intra-op Plan:   Post-operative Plan: Extubation in OR  Informed Consent: I have reviewed the patients History and Physical, chart, labs and discussed the procedure including the risks, benefits and alternatives for the proposed anesthesia with the patient or authorized representative who has indicated his/her understanding and acceptance.     Plan Discussed with:   Anesthesia Plan Comments:         Anesthesia Quick Evaluation

## 2014-10-08 NOTE — H&P (Signed)
PATIENT DETAILS Name: Cesar Powell Age: 81 y.o. Sex: male Date of Birth: 23-Jul-1931 Admit Date: 10/08/2014 [id number] Copland, MD   CHIEF COMPLAINT:  Fall and right leg pain  HPI: Cesar Powell is a 81 y.o. male with a Past Medical History of hypertension, prostate cancer who presents today with the above noted complaint. Per patient, approximately 3-4 weeks ago, while working out in the gym in [place] he claims he injured his right leg following which she has been having pain and has had difficultly putting weight on the right leg. A x-ray done in the outpatient setting during that time was negative for fractures, patient was then referred to physical therapy. This morning, while in the bathroom, he rotated to the right side and he felt his legs go weak causing the fall. He subsequently had severe right-sided hip pain. He was subsequently brought to the emergency department, where x-ray of the right hip showed a proximal right femoral fracture. I was asked to admit this patient for further evaluation and treatment Patient denies any syncopal episode this morning. He denies any chest pain, shortness of breath, palpitations. No history of nausea, vomiting or diarrhea. Patient is normally very active, and goes to the gym 3 times a week. He is in no prior history of CAD, CVA, chronic kidney disease or pulmonary embolism.   ALLERGIES:  No Known Allergies  PAST MEDICAL HISTORY: Past Medical History  Diagnosis Date  . Hypertension   . H/O prostate cancer     was treated with radiation  . Shingles     PAST SURGICAL HISTORY: Past Surgical History  Procedure Laterality Date  . Abdominal surgery      ruptured intestines     MEDICATIONS AT HOME: Prior to Admission medications   Medication Sig Start Date End Date Taking? Authorizing Provider  amLODipine (NORVASC) 5 MG tablet TAKE 1 TABLET (5 MG TOTAL) BY MOUTH DAILY. 08/06/14   Wellington Hampshire, MD  Ascorbic Acid (VITAMIN  C) 1000 MG tablet Take 1,000 mg by mouth daily.    Historical Provider, MD  cholecalciferol (VITAMIN D) 1000 UNITS tablet Take 1,000 Units by mouth daily.    Historical Provider, MD  Glucosamine-Chondroitin (GLUCOSAMINE CHONDR COMPLEX PO) Take 2 capsules by mouth daily.    Historical Provider, MD  hydrochlorothiazide (HYDRODIURIL) 25 MG tablet Take 1 tablet (25 mg total) by mouth daily. 09/10/14   Wellington Hampshire, MD  Multiple Vitamin (MULTIVITAMIN) capsule Take 1 capsule by mouth daily.    Historical Provider, MD  Omega-3 Fatty Acids (FISH OIL) 1200 MG CAPS Take 1,200 mg by mouth daily.    Historical Provider, MD  testosterone (ANDROGEL) 50 MG/5GM (1%) GEL Place onto the skin daily.    Historical Provider, MD    FAMILY HISTORY: Family History  Problem Relation Age of Onset  . Heart disease Brother   . Heart attack Brother     SOCIAL HISTORY:  reports that he quit smoking about 49 years ago. His smoking use included Cigarettes. He has a 15 pack-year smoking history. He has never used smokeless tobacco. He reports that he drinks about 3.5 oz of alcohol per week. He reports that he does not use illicit drugs.  REVIEW OF SYSTEMS:  Constitutional:   No  weight loss, night sweats,  Fevers, chills, fatigue.  HEENT:    No headaches, Difficulty swallowing,Tooth/dental problems,Sore throat  Cardio-vascular: No chest pain,  Orthopnea, PND, swelling in lower extremities, anasarca.  GI:  No heartburn, indigestion, abdominal pain, nausea, vomiting, diarrhea  Resp: No shortness of breath with exertion or at rest.  No excess mucus, no productive cough, No non-productive cough,  No coughing up of blood.No change in color of mucus.No wheezing.No chest wall deformity  Skin:  no rash or lesions.  GU:  no dysuria, change in color of urine, no urgency or frequency.  No flank pain.  Musculoskeletal: No joint pain or swelling.  No decreased range of motion.  No back pain.  Psych: No change in  mood or affect. No depression or anxiety.  No memory loss.   PHYSICAL EXAM: Blood pressure 164/73, pulse 71, temperature 97.4 F (36.3 C), temperature source Oral, resp. rate 16, height 5\' 8"  (1.727 m), weight 99.338 kg (219 lb), SpO2 100 %.  General appearance :Awake, alert, not in any distress. Speech Clear. Not toxic Looking HEENT: Atraumatic and Normocephalic, pupils equally reactive to light and accomodation Neck: supple, no JVD. No cervical lymphadenopathy.  Chest:Good air entry bilaterally, no added sounds  CVS: S1 S2 regular, no murmurs.  Abdomen: Bowel sounds present, Non tender and not distended with no gaurding, rigidity or rebound. Extremities: B/L Lower Ext shows no edema, both legs are warm to touch. Right leg is shortened and rotated externally. Chronic venous stasis changes in bilateral lower extremities. Neurology: Awake alert, and oriented X 3, CN II-XII intact, Non focal Skin:No Rash Wounds:N/A  LABS ON ADMISSION:   Recent Labs  10/08/14 0859  NA 136  K 3.6  CL 99  CO2 27  GLUCOSE 142*  BUN 21  CREATININE 1.23  CALCIUM 9.2   No results for input(s): AST, ALT, ALKPHOS, BILITOT, PROT, ALBUMIN in the last 72 hours. No results for input(s): LIPASE, AMYLASE in the last 72 hours.  Recent Labs  10/08/14 0859  WBC 11.5*  NEUTROABS 10.1*  HGB 15.8  HCT 46.1  MCV 91.5  PLT 192   No results for input(s): CKTOTAL, CKMB, CKMBINDEX, TROPONINI in the last 72 hours. No results for input(s): DDIMER in the last 72 hours. Invalid input(s): POCBNP   RADIOLOGIC STUDIES ON ADMISSION: Dg Chest 1 View  10/08/2014   CLINICAL DATA:  Fall. Right leg injury. Hypertension. History of prostate cancer. Preoperative for right hip fracture.  EXAM: CHEST  1 VIEW  COMPARISON:  None.  FINDINGS: Mild enlargement of the cardiopericardial silhouette, without edema. Thoracic spondylosis. The lungs appear clear. No pleural effusion.  Fragmented spur from the left inferior glenoid.  non-fragmented spurring of the right inferior glenoid.  IMPRESSION: 1. Mild enlargement of the cardiopericardial silhouette, without edema. 2. Thoracic spondylosis 3. Bilateral inferior glenoid spurring, with chronic fragmentation of the left inferior glenoid spur   Electronically Signed   By: Van Clines M.D.   On: 10/08/2014 09:46   Dg Hip Unilat  With Pelvis 2-3 Views Right  10/08/2014   CLINICAL DATA:  Fall in shower this morning. Proximal right femur pain.  EXAM: RIGHT HIP (WITH PELVIS) 2-3 VIEWS  COMPARISON:  09/19/2014  FINDINGS: There is a fracture through the proximal shaft of the right femur, just below the intertrochanteric region. There is angulation with medial displacement of the femoral shaft relative to the femoral neck. No subluxation or dislocation. Mild degenerative changes in the hips bilaterally.  IMPRESSION: Proximal right femoral shaft fracture just inferior to the intertrochanteric region with varus angulation and displacement.   Electronically Signed   By: Rolm Baptise M.D.   On: 10/08/2014 09:44  EKG: Independently reviewed. NSR  ASSESSMENT AND PLAN: Present on Admission:  . Femur fracture, right: Await orthopedics evaluation (Dr Rolena Infante). Moderate risk for any sort of surgical procedure, okay to proceed without any further workup. Patient is highly functional and very physically active. Not sure why patient was not able to place weight on his right leg prior to his fall today, x-rays done in mid February negative for any fracture. Not sure whether he had ligamentous/soft tissue problem or a very very small fracture that was not evident on x-ray.  . Essential hypertension: Continue HCTZ and amlodipine.  [name] History of prostate cancer  Further plan will depend as patient's clinical course evolves and further radiologic and laboratory data become available. Patient will be monitored closely.   Above noted plan was discussed with patient/family, they were in  agreement.   DVT Prophylaxis: Prophylactic  Heparin  Code Status: Full Code  Disposition Plan: suspect SNF in 2-3 days  Total time spent for admission equals 45 minutes.  [facility] Pager [phone]  If 7PM-7AM, please contact night-coverage [email or web address] Password [email or web address] 10/08/2014, 11:06 AM

## 2014-10-08 NOTE — Telephone Encounter (Signed)
Cesar Powell (son in law) called stating Cesar Cesar Powell fell this morning in tub and he broke his femur. He would like dr copland recommendation on a orthopedic dr. [name] is at [facility]  They would like a call  ASAP

## 2014-10-08 NOTE — Telephone Encounter (Signed)
I spoke to the patient's son-in-law. I have not seen this patient in 5 years, very difficult for me to comment on much.  Unclear exactly who is on trauma call for ortho today - I would go with recommendations from surgeon in hospital. He asked me for group or surgeon recs and I recommended [facility], but really told him that he needs to defer to the trauma team evaluating the case.   Electronically Signed  By: Owens Loffler, MD On: 10/08/2014 11:15 AM

## 2014-10-08 NOTE — Anesthesia Postprocedure Evaluation (Signed)
  Anesthesia Post-op Note  Patient: Cesar Powell  Procedure(s) Performed: Procedure(s) (LRB): INTRAMEDULLARY (IM) NAIL FEMORAL (Right)  Patient Location: PACU  Anesthesia Type: General  Level of Consciousness: awake and alert   Airway and Oxygen Therapy: Patient Spontanous Breathing  Post-op Pain: mild  Post-op Assessment: Post-op Vital signs reviewed, Patient's Cardiovascular Status Stable, Respiratory Function Stable, Patent Airway and No signs of Nausea or vomiting  Last Vitals:  Filed Vitals:   10/08/14 1642  BP: 155/82  Pulse: 81  Temp: 36.8 C  Resp: 12    Post-op Vital Signs: stable   Complications: No apparent anesthesia complications

## 2014-10-08 NOTE — ED Provider Notes (Signed)
CSN: [id number]     Arrival date & time 10/08/14  0840 History   First MD Initiated Contact with Patient 10/08/14 [phone]     Chief Complaint  Patient presents with  . Fall  . Leg Pain     (Consider location/radiation/quality/duration/timing/severity/associated sxs/prior Treatment) HPI Comments: The patient is an 81 year old male with a history of hypertension who recently injured his right hip while he was working out at a gym in [place], he has been ambulatory on that leg and going to physical therapy but with significant amounts of pain in the hip. This morning while taking a shower he rotated to the side, felt his legs go weak prompting a fall, he is unsure what he fell onto but has had resultant severe hip pain. The symptoms are persistent, worse with movement of the leg, not associated with loss of consciousness, nausea or chest pain or shortness of breath. He did have a fall approximately one week ago, mild head injury and has had a black eye since that time. He was given 100 g of fentanyl in route with minimal improvement.  Patient is a 81 y.o. male presenting with fall and leg pain. The history is provided by the patient, the spouse and the EMS personnel.  Fall  Leg Pain   Past Medical History  Diagnosis Date  . Hypertension   . H/O prostate cancer     was treated with radiation  . Shingles    Past Surgical History  Procedure Laterality Date  . Abdominal surgery      ruptured intestines    Family History  Problem Relation Age of Onset  . Heart disease Brother   . Heart attack Brother    History  Substance Use Topics  . Smoking status: Former Smoker -- 1.00 packs/day for 15 years    Types: Cigarettes    Quit date: 09/14/1965  . Smokeless tobacco: Never Used  . Alcohol Use: 3.5 oz/week    7 drink(s) per week     Comment: daily    Review of Systems  All other systems reviewed and are negative.     Allergies  Review of patient's allergies indicates no known  allergies.  Home Medications   Prior to Admission medications   Medication Sig Start Date End Date Taking? Authorizing Provider  amLODipine (NORVASC) 5 MG tablet TAKE 1 TABLET (5 MG TOTAL) BY MOUTH DAILY. 08/06/14   Wellington Hampshire, MD  Ascorbic Acid (VITAMIN C) 1000 MG tablet Take 1,000 mg by mouth daily.    Historical Provider, MD  cholecalciferol (VITAMIN D) 1000 UNITS tablet Take 1,000 Units by mouth daily.    Historical Provider, MD  Glucosamine-Chondroitin (GLUCOSAMINE CHONDR COMPLEX PO) Take 2 capsules by mouth daily.    Historical Provider, MD  hydrochlorothiazide (HYDRODIURIL) 25 MG tablet Take 1 tablet (25 mg total) by mouth daily. 09/10/14   Wellington Hampshire, MD  Multiple Vitamin (MULTIVITAMIN) capsule Take 1 capsule by mouth daily.    Historical Provider, MD  Omega-3 Fatty Acids (FISH OIL) 1200 MG CAPS Take 1,200 mg by mouth daily.    Historical Provider, MD  testosterone (ANDROGEL) 50 MG/5GM (1%) GEL Place onto the skin daily.    Historical Provider, MD   BP 164/73 mmHg  Pulse 71  Temp(Src) 97.4 F (36.3 C) (Oral)  Resp 16  Ht 5\' 8"  (1.727 m)  Wt 219 lb (99.338 kg)  BMI 33.31 kg/m2  SpO2 100% Physical Exam  Constitutional: He appears well-developed and well-nourished.  No distress.  HENT:  Head: Normocephalic.  Mouth/Throat: Oropharynx is clear and moist. No oropharyngeal exudate.  Fading bruising around the left eye, no battle sign, no malocclusion, no tenderness around the head or scalp  Eyes: Conjunctivae and EOM are normal. Pupils are equal, round, and reactive to light. Right eye exhibits no discharge. Left eye exhibits no discharge. No scleral icterus.  Neck: Normal range of motion. Neck supple. No JVD present. No thyromegaly present.  Cardiovascular: Normal rate, regular rhythm, normal heart sounds and intact distal pulses.  Exam reveals no gallop and no friction rub.   No murmur heard. Pulses at the right and left feet are palpable bilaterally  Pulmonary/Chest:  Effort normal and breath sounds normal. No respiratory distress. He has no wheezes. He has no rales.  Abdominal: Soft. Bowel sounds are normal. He exhibits no distension and no mass. There is no tenderness.  Musculoskeletal: He exhibits tenderness. He exhibits no edema.  Right lower extremity is shortened, externally rotated, there is significant tenderness with range of motion including external and internal rotation as well as flexion of the hip.  Lymphadenopathy:    He has no cervical adenopathy.  Neurological: He is alert. Coordination normal.  Skin: Skin is warm and dry. No rash noted. No erythema.  Psychiatric: He has a normal mood and affect. His behavior is normal.  Nursing note and vitals reviewed.   ED Course  Procedures (including critical care time) Labs Review Labs Reviewed  CBC WITH DIFFERENTIAL/PLATELET - Abnormal; Notable for the following:    WBC 11.5 (*)    Neutrophils Relative % 87 (*)    Neutro Abs 10.1 (*)    Lymphocytes Relative 7 (*)    All other components within normal limits  BASIC METABOLIC PANEL - Abnormal; Notable for the following:    Glucose, Bld 142 (*)    GFR calc non Af Amer 53 (*)    GFR calc Af Amer 61 (*)    All other components within normal limits  PROTIME-INR  TYPE AND SCREEN  ABO/RH    Imaging Review Dg Chest 1 View  10/08/2014   CLINICAL DATA:  Fall. Right leg injury. Hypertension. History of prostate cancer. Preoperative for right hip fracture.  EXAM: CHEST  1 VIEW  COMPARISON:  None.  FINDINGS: Mild enlargement of the cardiopericardial silhouette, without edema. Thoracic spondylosis. The lungs appear clear. No pleural effusion.  Fragmented spur from the left inferior glenoid. non-fragmented spurring of the right inferior glenoid.  IMPRESSION: 1. Mild enlargement of the cardiopericardial silhouette, without edema. 2. Thoracic spondylosis 3. Bilateral inferior glenoid spurring, with chronic fragmentation of the left inferior glenoid spur    Electronically Signed   By: Van Clines M.D.   On: 10/08/2014 09:46   Dg Hip Unilat  With Pelvis 2-3 Views Right  10/08/2014   CLINICAL DATA:  Fall in shower this morning. Proximal right femur pain.  EXAM: RIGHT HIP (WITH PELVIS) 2-3 VIEWS  COMPARISON:  09/19/2014  FINDINGS: There is a fracture through the proximal shaft of the right femur, just below the intertrochanteric region. There is angulation with medial displacement of the femoral shaft relative to the femoral neck. No subluxation or dislocation. Mild degenerative changes in the hips bilaterally.  IMPRESSION: Proximal right femoral shaft fracture just inferior to the intertrochanteric region with varus angulation and displacement.   Electronically Signed   By: Rolm Baptise M.D.   On: 10/08/2014 09:44     EKG Interpretation   Date/Time:  Monday  October 08 2014 09:12:46 EST Ventricular Rate:  61 PR Interval:  244 QRS Duration: 111 QT Interval:  422 QTC Calculation: 425 R Axis:   61 Text Interpretation:  Sinus rhythm Prolonged PR interval Low voltage,  precordial leads No old tracing to compare Abnormal ekg Confirmed by  Helen Winterhalter  MD, Demecia Northway ([id number]) on 10/08/2014 10:11:07 AM      MDM   Final diagnoses:  Femur fracture, right, closed, initial encounter    The patient has evidence of what appears to be a right hip fracture. He is neurovascular status is normal, he is mildly hypertensive. We'll keep the patient nothing by mouth, pain control, imaging, preop labs and preop EKG. He has no neurologic findings, no headache, no signs of sequela from his head injury from last week.  Pt informed of all of his results - he has good pain control unless moving the leg - will need ortho consult re: hip frx as seen on xray, pre op ECG without acute findings.  D/w Dr. Rolena Infante and Dr. Sloan Leiter, the latter who will admit  Meds given in ED:  Medications  fentaNYL (SUBLIMAZE) injection 100 mcg (not administered)  ondansetron (ZOFRAN) injection 4  mg (not administered)  0.9 %  sodium chloride infusion ( Intravenous New Bag/Given 10/08/14 1034)    New Prescriptions   No medications on file        Noemi Chapel, MD 10/08/14 1046

## 2014-10-08 NOTE — ED Notes (Signed)
Patient states having pain but denies the need for pain medication.

## 2014-10-09 ENCOUNTER — Encounter (HOSPITAL_COMMUNITY): Payer: Self-pay | Admitting: Orthopedic Surgery

## 2014-10-09 DIAGNOSIS — S7221XA Displaced subtrochanteric fracture of right femur, initial encounter for closed fracture: Principal | ICD-10-CM

## 2014-10-09 LAB — BASIC METABOLIC PANEL
Anion gap: 8 (ref 5–15)
BUN: 15 mg/dL (ref 6–23)
CO2: 30 mmol/L (ref 19–32)
Calcium: 8.6 mg/dL (ref 8.4–10.5)
Chloride: 101 mmol/L (ref 96–112)
Creatinine, Ser: 1.13 mg/dL (ref 0.50–1.35)
GFR calc non Af Amer: 59 mL/min — ABNORMAL LOW (ref >90)
GFR, EST AFRICAN AMERICAN: 68 mL/min — AB (ref >90)
GLUCOSE: 116 mg/dL — AB (ref 70–99)
POTASSIUM: 3.9 mmol/L (ref 3.5–5.1)
SODIUM: 139 mmol/L (ref 135–145)

## 2014-10-09 LAB — CBC
HEMATOCRIT: 40.4 % (ref 39.0–52.0)
Hemoglobin: 13.6 g/dL (ref 13.0–17.0)
MCH: 30.8 pg (ref 26.0–34.0)
MCHC: 33.7 g/dL (ref 30.0–36.0)
MCV: 91.6 fL (ref 78.0–100.0)
Platelets: 164 10*3/uL (ref 150–400)
RBC: 4.41 MIL/uL (ref 4.22–5.81)
RDW: 14.6 % (ref 11.5–15.5)
WBC: 6.9 10*3/uL (ref 4.0–10.5)

## 2014-10-09 MED ORDER — ENOXAPARIN SODIUM 40 MG/0.4ML ~~LOC~~ SOLN: 40.0000 mg | SUBCUTANEOUS | Status: DC

## 2014-10-09 MED ORDER — ENOXAPARIN SODIUM 40 MG/0.4ML ~~LOC~~ SOLN
40.0000 mg | SUBCUTANEOUS | Status: DC
  Administered 2014-10-09 – 2014-10-11 (×3): 40 mg via SUBCUTANEOUS
  Filled 2014-10-09 (×3): qty 0.4

## 2014-10-09 MED ORDER — HYDROCODONE-ACETAMINOPHEN 5-325 MG PO TABS: 1.0000 | ORAL_TABLET | ORAL | Status: DC | PRN

## 2014-10-09 NOTE — Evaluation (Signed)
Physical Therapy Evaluation Patient Details Name: Cesar Powell MRN: [id number] DOB: 09-Aug-1930 Today's Date: 10/09/2014   History of Present Illness  81 y.o. male admitted to [facility] on 10/08/14 with suspected fracture and then fall of his right hip.  He is now s/p R hip IM nail and is TDWB (30%) per post op orders.  Pt with significant PMHx of HTN.    Clinical Impression  Pt is mobilizing with min assistance and RW for short, in-room distances.  He is at high risk of falls due to his TDWB status of his right foot and this will significantly limit his ability to do stairs and longer distance gait at this time.  He does have the support of his wife who he indicated could not provide much physical assistance to him at discharge.  He would be appropriate for SNF level rehab at discharge.   PT to follow acutely for deficits listed below.       Follow Up Recommendations SNF    Equipment Recommendations  3in1 (PT)    Recommendations for Other Services   NA     Precautions / Restrictions Precautions Precautions: Fall Restrictions Weight Bearing Restrictions: Yes RLE Weight Bearing: Touchdown weight bearing RLE Partial Weight Bearing Percentage or Pounds: 30%      Mobility  Bed Mobility Overal bed mobility: Needs Assistance Bed Mobility: Supine to Sit     Supine to sit: Min assist;HOB elevated     General bed mobility comments: Min assist to help progress his right leg over EOB. Verbal cues for 1/2 bridge technique and for hand placement.  Preformed warm up exercises in bed prior to getting up.   Transfers Overall transfer level: Needs assistance Equipment used: Rolling walker (2 wheeled) Transfers: Sit to/from Stand Sit to Stand: Min assist;From elevated surface         General transfer comment: Min assist to support trunk and stabilize RW during transitions.  Verbal cues for safe hand placement as pt wanted to put both hands on RW during transitions.  Verbal cues for foot  placement for comfort for transition to sitting from standing to lower recliner chair.   Ambulation/Gait Ambulation/Gait assistance: Min assist Ambulation Distance (Feet): 8 Feet Assistive device: Rolling walker (2 wheeled) Gait Pattern/deviations: Step-to pattern;Antalgic (hop-to type gait pattern due to TDWB status)     General Gait Details: Verbal cues for correct LE sequencing, and to reinforce what TDWB would be in standing and with gait.          Balance Overall balance assessment: Needs assistance Sitting-balance support: Feet supported;Bilateral upper extremity supported;No upper extremity supported Sitting balance-Leahy Scale: Good     Standing balance support: Bilateral upper extremity supported Standing balance-Leahy Scale: Poor                               Pertinent Vitals/Pain Pain Assessment: No/denies pain Pain Score: 3  Pain Location: right leg at rest Pain Descriptors / Indicators: Aching;Burning Pain Intervention(s): Limited activity within patient's tolerance;Monitored during session;Repositioned;RN gave pain meds during session;Ice applied    Home Living Family/patient expects to be discharged to:: Private residence Living Arrangements: Spouse/significant other Available Help at Discharge: Family;Available 24 hours/day Type of Home: House Home Access: Stairs to enter Entrance Stairs-Rails: Left Entrance Stairs-Number of Steps: 3 Home Layout: Two level;Able to live on main level with bedroom/bathroom Home Equipment: None      Prior Function Level of Independence: Independent  Comments: Pt reports he used bil canes most of the time for community ambulation as his right leg would give away on him.      Hand Dominance   Dominant Hand: Right    Extremity/Trunk Assessment             RLE Deficits / Details: right leg with normal post op pain and weakness.  Pt with 4/5 ankle, 2+/5 knee, 2/5 hip  Ankle 4/5, knee 2+/5,  hip 2/5  Cervical / Trunk Assessment: Normal  Communication   Communication: No difficulties  Cognition Arousal/Alertness: Awake/alert Behavior During Therapy: WFL for tasks assessed/performed Overall Cognitive Status: Within Functional Limits for tasks assessed                         Exercises Total Joint Exercises Ankle Circles/Pumps: AROM;Both;20 reps;Supine Quad Sets: AROM;Right;10 reps;Supine Heel Slides: AAROM;Right;10 reps;Supine Hip ABduction/ADduction: AAROM;Right;10 reps;Supine      Assessment/Plan    PT Assessment Patient needs continued PT services  PT Diagnosis Difficulty walking;Abnormality of gait;Generalized weakness;Acute pain   PT Problem List Decreased strength;Decreased range of motion;Decreased activity tolerance;Decreased balance;Decreased mobility;Decreased knowledge of use of DME;Pain  PT Treatment Interventions DME instruction;Gait training;Stair training;Functional mobility training;Therapeutic activities;Therapeutic exercise;Balance training;Neuromuscular re-education;Patient/family education;Modalities;Manual techniques   PT Goals (Current goals can be found in the Care Plan section) Acute Rehab PT Goals Patient Stated Goal: to go to rehab so he is ready to go home PT Goal Formulation: With patient Time For Goal Achievement: 10/16/14 Potential to Achieve Goals: Good    Frequency Min 5X/week   Barriers to discharge Inaccessible home environment;Decreased caregiver support two story home with wife who can provide limted physical assistnace (mostly supervision)       End of Session   Activity Tolerance: Patient limited by pain Patient left: in chair;with call bell/phone within reach           Time: 1200-1236 PT Time Calculation (min) (ACUTE ONLY): 36 min   Charges:   PT Evaluation $Initial PT Evaluation Tier I: 1 Procedure PT Treatments $Therapeutic Activity: 8-22 mins        Gay Moncivais B. Fitz Matsuo, PT, DPT [phone]    10/09/2014, 3:13 PM

## 2014-10-09 NOTE — Op Note (Signed)
NAME:  Cesar Powell, Cesar Powell NO.:  [id number]  MEDICAL RECORD NO.:  [id number]  LOCATION:  [id number]                        FACILITY:  [place]  PHYSICIAN:  Rod Can, MD     DATE OF BIRTH:  [birth year]  DATE OF PROCEDURE:  10/08/2014 DATE OF DISCHARGE:                              OPERATIVE REPORT   PREOPERATIVE DIAGNOSIS:  Right subtrochanteric femur fracture.  PREOPERATIVE DIAGNOSIS:  Right subtrochanteric femur fracture.  PROCEDURE: Open reduction, intramedullary fixation of right subtrochanteric femur fracture.  IMPLANTS: 1. Stryker Gamma3 nail, 10 mm x 380 mm x 125 degrees Gamma3 nail. 2. 10.5 x 105 mm Titanium lag screw. 3. 5 mm distal interlocking screw x2.  ANESTHESIA:  General.  SURGEON:  Rod Can, MD.  ASSISTANTKy Barban, RNFA.  ESTIMATED BLOOD LOSS:  150 mL.  ANTIBIOTICS:  Ancef 2 g.  SPECIMENS:  None.  COMPLICATIONS:  None.  TUBES AND DRAINS:  None.  DISPOSITION:  Stable to PACU.  INDICATIONS:  The patient is an 81 year old male who was in a shower earlier this morning, when his leg gave out and he fell.  He had severe pain and was unable to weight bear.  He was brought to the emergency department at [facility] where x-rays revealed a subtrochanteric femur fracture.  He was seen by the Hospitalist Service and underwent perioperative risk stratification, medical optimization. Risks, benefits, and alternatives to open reduction intramedullary nail fixation of his right subtrochanteric femur fracture were explained and he elected to proceed.  DESCRIPTION OF PROCEDURE:  The patient was correctly identified in the preop holding area using 2 identifiers.  Surgical site was marked by myself.  The patient was taken to the operating room and general anesthesia was induced on his bed.  A Foley catheter was inserted and he was positioned on the Hana table.  The nonoperative left lower extremity was scissored under the  right lower extremity.  I reduced the fracture by applying slight abduction, flexion of the hip, and longitudinal traction.  He was internally rotated 15 degrees.  X-rays were brought in which showed good alignment, although the proximal fragment was in persistent varus in flexion.  Time-out was called, verifying site and site of surgery.  Antibiotics were given within 60 minutes of beginning the procedure.  I began by making a longitudinal incision approximately 6 inches long centered over the fracture.  Dissection was carried down to the IT band which I split in line with the fibers.  I then reflected the vastus lateralis of the posterior origin and I moved the muscle anteriorly.  I placed Hohmann retractor around the distal fracture fragment.  I placed a Lobster claw on the proximal fracture fragment and I manually reduced the fracture.  I then applied a turkey-claw clamp to the fracture.  I made a 4 cm incision proximal to the tip of the trochanter and I placed a guide pin for standard trochanteric start point.  Entry reamer was used and then I placed a guidewire and I advanced past the fracture site to the physeal scar of the knee.  I then measured the length of the nail to be 380 mm.  I sequentially  reamed up to a 12 mm reamer with excellent chatter.  A 10 x 380 nail was selected and this was assembled on the jig.  I impacted the nail and then I removed the guidewire.  Through the incision used to reduced the fracture, I inserted the cannula down to the bone.  A guide pin was placed for the lag screw and the position was checked with AP and lateral fluoroscopy views.  I measured the length of the screw.  I then drilled and placed the lag screw using standard technique.  I placed the set screw and this was locked tight.  I then removed the jig.  AP and lateral fluoroscopy views were used to confirm fracture reduction and lag screw placement. Tip apex distance was appropriate.   Traction was removed, and I manually  impacted the fracture through the foot. I then turned my attention distally. Using a perfect circle technique, I placed 2 distal interlocking screws. Final fluoroscopy views were obtained.  Fracture reduction was near anatomic. Tip-apex distance was appropriate. I performed live fluoroscopy of the hip to ensure there was no chondral penetration.  I then copiously irrigated the wounds with sterile saline.  The IT band was closed with #1 interrupted Vicryl suture.  The deep dermal layer was closed with 2-0 interrupted Monocryl suture and 3-0 running Monocryl stitch for this subcuticular layer.  Glue was applied to the skin.  Once the glue was fully hardened, a sterile dressing was applied.  The patient was transferred to the bed, extubated, transferred to the PACU in stable condition.  Sponge, needle, and instrument counts were correct at the end of the case x2.  There were no known complications.  Following the procedure, operative events and findings were discussed with the family.  They understand we will admit the patient to the Hospitalist Service.  He will be touchdown weightbearing to the right lower extremity.  He will have chemical DVT prophylaxis in the form of Lovenox.  His placement will be pending PT and OT evaluations.  I need to see him in the office in 2 weeks for repeat clinical and radiographic evaluation.  All questions were solicited and answered to their satisfaction.          ______________________________ Rod Can, MD     BS/MEDQ  D:  10/08/2014  T:  10/09/2014  Job:  [id number]

## 2014-10-09 NOTE — Evaluation (Signed)
Occupational Therapy Evaluation Patient Details Name: Cesar Powell MRN: [id number] DOB: [birth year] Today's Date: 10/09/2014    History of Present Illness 81 y.o. male admitted to [facility] on 10/08/14 with suspected fracture and then fall of his right hip.  He is now s/p R hip IM nail and is TDWB (30%) per post op orders.  Pt with significant PMHx of HTN.     Clinical Impression   Patient independent and working out/lifting weights 3 days a week PTA. Patient currently requires min>mod assist. Patient will benefit from acute OT to increase overall independence in the areas of ADLs, functional mobility, and overall safety in order to safely discharge home with wife. Orthopedics note from this am read "up with therapy".     Follow Up Recommendations  Home health OT;Supervision/Assistance - 24 hour    Equipment Recommendations  3 in 1 bedside comode;Other (comment) (AE - reacher, sock aid, LH sponge, LH shoe horn)    Recommendations for Other Services  None at this time   Precautions / Restrictions Precautions Precautions: Fall Restrictions Weight Bearing Restrictions: Yes RLE Weight Bearing: Touchdown weight bearing RLE Partial Weight Bearing Percentage or Pounds: 30%      Mobility - Per PT note Bed Mobility Overal bed mobility: Needs Assistance Bed Mobility: Supine to Sit     Supine to sit: Min assist;HOB elevated     General bed mobility comments: Min assist to help progress his right leg over EOB. Verbal cues for 1/2 bridge technique and for hand placement.  Preformed warm up exercises in bed prior to getting up.   Transfers Overall transfer level: Needs assistance Equipment used: Rolling walker (2 wheeled) Transfers: Sit to/from Stand Sit to Stand: Min assist;From elevated surface   General transfer comment: Min assist to support trunk and stabilize RW during transitions.  Verbal cues for safe hand placement as pt wanted to put both hands on RW during transitions.  Verbal  cues for foot placement for comfort for transition to sitting from standing to lower recliner chair.     Balance - Per PT note Overall balance assessment: Needs assistance Sitting-balance support: Feet supported;Bilateral upper extremity supported;No upper extremity supported Sitting balance-Leahy Scale: Good     Standing balance support: Bilateral upper extremity supported Standing balance-Leahy Scale: Poor     ADL Overall ADL's : Needs assistance/impaired     Grooming: Min guard;Standing   Upper Body Bathing: Set up;Sitting   Lower Body Bathing: Moderate assistance;Sit to/from stand   Upper Body Dressing : Set up;Sitting   Lower Body Dressing: Moderate assistance;Sit to/from stand   Toilet Transfer: Min guard;RW;Ambulation;Comfort height toilet;BSC   Toileting- Water quality scientist and Hygiene: Supervision/safety;Sit to/from stand       Functional mobility during ADLs: Min guard;Rolling walker;Cueing for safety General ADL Comments: Patient TDWB(30%) through RLE. Patient adhereing to precautions safely and appropriately. Patient unable to reach BLEs for LB ADLs, discussed AE for use at home and plan to introduce and educate pt about their usage next OT session. Patient overall min guard for funtional mobility/transfers. Patient's wife present and very supportive, stating she will be home 24/7 for supervision/assistance prn post discharge.     Pertinent Vitals/Pain Pain Assessment: No/denies pain Pain Score: 3  Pain Location: right leg at rest Pain Descriptors / Indicators: Aching;Burning Pain Intervention(s): Limited activity within patient's tolerance;Monitored during session;Repositioned;RN gave pain meds during session;Ice applied     Hand Dominance Right   Extremity/Trunk Assessment Upper Extremity Assessment Upper Extremity Assessment: Overall WFL for  tasks assessed   Lower Extremity Assessment Lower Extremity Assessment: Defer to PT evaluation RLE Deficits  / Details: right leg with normal post op pain and weakness.  Pt with 4/5 ankle, 2+/5 knee, 2/5 hip   Cervical / Trunk Assessment Cervical / Trunk Assessment: Normal   Communication Communication Communication: No difficulties   Cognition Arousal/Alertness: Awake/alert Behavior During Therapy: WFL for tasks assessed/performed Overall Cognitive Status: Within Functional Limits for tasks assessed              Home Living Family/patient expects to be discharged to:: Private residence Living Arrangements: Spouse/significant other Available Help at Discharge: Family;Available 24 hours/day Type of Home: House Home Access: Stairs to enter [employer] of Steps: 3 Entrance Stairs-Rails: Left Home Layout: Two level;Able to live on main level with bedroom/bathroom Alternate Level Stairs-Number of Steps: flight   Bathroom Shower/Tub: Walk-in shower;Door   [employer] Toilet: Standard     Home Equipment: None          Prior Functioning/Environment Level of Independence: Independent        Comments: Pt reports he used bil canes most of the time for community ambulation as his right leg would give away on him.     OT Diagnosis: Generalized weakness;Acute pain   OT Problem List: Decreased strength;Decreased range of motion;Decreased activity tolerance;Impaired balance (sitting and/or standing);Decreased knowledge of use of DME or AE;Decreased knowledge of precautions;Pain   OT Treatment/Interventions: Self-care/ADL training;Therapeutic exercise;Energy conservation;DME and/or AE instruction;Therapeutic activities;Patient/family education;Balance training    OT Goals(Current goals can be found in the care plan section) Acute Rehab OT Goals Patient Stated Goal: get stronger OT Goal Formulation: With patient/family Time For Goal Achievement: 10/16/14 Potential to Achieve Goals: Good ADL Goals Pt Will Perform Lower Body Bathing: with supervision;with adaptive equipment;sit  to/from stand Pt Will Perform Lower Body Dressing: with supervision;with adaptive equipment;sit to/from stand Pt Will Transfer to Toilet: with supervision;ambulating;bedside commode Pt Will Perform Tub/Shower Transfer: with supervision;Shower transfer;3 in 1;ambulating;rolling walker  OT Frequency: Min 2X/week   Barriers to D/C: None known at this time          End of Session Equipment Utilized During Treatment: Rolling walker  Activity Tolerance: Patient tolerated treatment well Patient left: in chair;with call bell/phone within reach;with family/visitor present   Time: 1445-1511 OT Time Calculation (min): 26 min Charges:  OT General Charges $OT Visit: 1 Procedure OT Evaluation $Initial OT Evaluation Tier I: 1 Procedure OT Treatments $Self Care/Home Management : 8-22 mins  Ziza Hastings , MS, OTR/L, CLT Pager: [phone]  10/09/2014, 3:43 PM

## 2014-10-09 NOTE — Progress Notes (Signed)
INITIAL NUTRITION ASSESSMENT  DOCUMENTATION CODES Per approved criteria  -Obesity Unspecified   INTERVENTION: Encourage adequate PO intake.  NUTRITION DIAGNOSIS: Increased nutrient needs related to s/p surgery as evidenced by estimated nutrition needs.   Goal: Pt to meet >/= 90% of their estimated nutrition needs   Monitor:  PO intake, weight trends, labs, I/O's  Reason for Assessment: MD consult for assessment of nutrition requirements/status  81 y.o. male  Admitting Dx: Subtrochanteric fracture of femur  ASSESSMENT: Pt with a Past Medical History of hypertension, prostate cancer who presents with fall and right leg pain. X-ray of the right hip showed a proximal right femoral fracture.  Procedures (3/7): Procedure(s) (LRB): INTRAMEDULLARY (IM) NAIL FEMORAL (Right)  Pt reports having a good appetite currently and PTA at home eating 3 full meals a day with no other difficulties. Pt reports weight however it was intentional. Pt was offered supplements/nourishment snacks however pt refused reporting he is doing fine. Pt was encouraged to eat his food at meals.   Pt with no observed significant fat or muscle mass loss.  Labs and medications reviewed.  Height: Ht Readings from Last 1 Encounters:  10/08/14 5\' 8"  (1.727 m)    Weight: Wt Readings from Last 1 Encounters:  10/08/14 219 lb (99.338 kg)    Ideal Body Weight: 154 lbs  % Ideal Body Weight: 142%  Wt Readings from Last 10 Encounters:  10/08/14 219 lb (99.338 kg)  09/19/14 235 lb 12 oz (106.935 kg)  09/10/14 238 lb 8 oz (108.183 kg)  08/09/14 237 lb 6.4 oz (107.684 kg)  06/08/14 237 lb 9.6 oz (107.775 kg)  04/19/14 230 lb (104.327 kg)  01/04/14 228 lb (103.42 kg)  06/20/13 231 lb 8 oz (105.008 kg)  12/15/12 235 lb 12 oz (106.935 kg)  04/08/12 242 lb 12 oz (110.111 kg)    Usual Body Weight: 219 lbs  % Usual Body Weight: 93%  BMI:  Body mass index is 33.31 kg/(m^2). Class I obesity  Estimated  Nutritional Needs: Kcal: 1900-2100 Protein: 100-115 grams Fluid: 1.9 - 2.1 L/day  Skin: Incision on right hip  Diet Order: Diet regular Diet - low sodium heart healthy  EDUCATION NEEDS: -No education needs identified at this time   Intake/Output Summary (Last 24 hours) at 10/09/14 1020 Last data filed at 10/09/14 0609  Gross per 24 hour  Intake   2150 ml  Output   1400 ml  Net    750 ml    Last BM: 3/6  Labs:   Recent Labs Lab 10/08/14 0859 10/09/14 0548  NA 136 139  K 3.6 3.9  CL 99 101  CO2 27 30  BUN 21 15  CREATININE 1.23 1.13  CALCIUM 9.2 8.6  GLUCOSE 142* 116*    CBG (last 3)  No results for input(s): GLUCAP in the last 72 hours.  Scheduled Meds: . amLODipine  5 mg Oral Daily  . cholecalciferol  1,000 Units Oral Daily  . docusate sodium  100 mg Oral BID  . enoxaparin (LOVENOX) injection  40 mg Subcutaneous Q24H  . hydrochlorothiazide  25 mg Oral Daily  . multivitamin with minerals  1 tablet Oral Daily  . omega-3 acid ethyl esters  1 g Oral Daily  . senna  1 tablet Oral BID  . vitamin C  1,000 mg Oral Daily    Continuous Infusions: . lactated ringers Stopped (10/08/14 1847)    Past Medical History  Diagnosis Date  . Hypertension   . H/O prostate cancer  was treated with radiation  . Shingles     Past Surgical History  Procedure Laterality Date  . Abdominal surgery      ruptured intestines     Kallie Locks, MS, RD, LDN Pager # [phone] After hours/ weekend pager # [phone]

## 2014-10-09 NOTE — Progress Notes (Signed)
PROGRESS NOTE  [facility] XLK:[id number] DOB: 09/08/1930 DOA: 10/08/2014 PCP: Owens Loffler, MD  HPI/Recap of past 24 hours: Returned back from surgery, reported "feel fine"  Assessment/Plan: Principal Problem:   Subtrochanteric fracture of femur Active Problems:   Essential hypertension   PROSTATE CANCER, HX OF   Femur fracture, right  . Femur fracture, right: INTRAMEDULLARY (IM) NAIL FEMORAL (Right) 3/8, management per ortho: TDWB RLE with walker IV ancef x23 hrs DVT ppx: lovenox x30 days, SCDs, TEDs PO pain control Up with therapy Dispo: pending PT/OT eval, SNF vs HH PT  . Essential hypertension: Continue HCTZ and amlodipine.  [name] History of prostate cancer, s/p radiation, currently on testosterone supplement with close urology follow up per patient report   DVT Prophylaxis: lovenox  Code Status: Full Code  Disposition Plan: HH vs SNF in 2-3 days Family Communication: patient   Consultants:  orthopedics  Procedures: INTRAMEDULLARY (IM) NAIL FEMORAL (Right) 3/8  Antibiotics:  Perioperative ancef   Objective: BP 149/56 mmHg  Pulse 65  Temp(Src) 98.4 F (36.9 C) (Oral)  Resp 16  Ht 5\' 8"  (1.727 m)  Wt 99.338 kg (219 lb)  BMI 33.31 kg/m2  SpO2 99%  Intake/Output Summary (Last 24 hours) at 10/09/14 1430 Last data filed at 10/09/14 0609  Gross per 24 hour  Intake   2100 ml  Output   1400 ml  Net    700 ml   Filed Weights   10/08/14 0844  Weight: 99.338 kg (219 lb)    Exam:  General appearance :Awake, alert, not in any distress. Speech Clear. Appear younger than stated age. HEENT: Atraumatic and Normocephalic, pupils equally reactive to light and accomodation Neck: supple, no JVD. No cervical lymphadenopathy.  Chest:Good air entry bilaterally, no added sounds  CVS: S1 S2 regular, no murmurs.  Abdomen: Bowel sounds present, Non tender and not distended with no gaurding, rigidity or rebound. Extremities: B/L Lower Ext shows no  edema, both legs are warm to touch. Right leg post op changes. Chronic venous stasis changes in bilateral lower extremities. Neurology: Awake alert, and oriented X 3, CN II-XII intact, Non focal Skin:No Rash Wounds:N/A  Data Reviewed: Basic Metabolic Panel:  Recent Labs Lab 10/08/14 0859 10/09/14 0548  NA 136 139  K 3.6 3.9  CL 99 101  CO2 27 30  GLUCOSE 142* 116*  BUN 21 15  CREATININE 1.23 1.13  CALCIUM 9.2 8.6   Liver Function Tests: No results for input(s): AST, ALT, ALKPHOS, BILITOT, PROT, ALBUMIN in the last 168 hours. No results for input(s): LIPASE, AMYLASE in the last 168 hours. No results for input(s): AMMONIA in the last 168 hours. CBC:  Recent Labs Lab 10/08/14 0859 10/09/14 0548  WBC 11.5* 6.9  NEUTROABS 10.1*  --   HGB 15.8 13.6  HCT 46.1 40.4  MCV 91.5 91.6  PLT 192 164   Cardiac Enzymes:   No results for input(s): CKTOTAL, CKMB, CKMBINDEX, TROPONINI in the last 168 hours. BNP (last 3 results) No results for input(s): BNP in the last 8760 hours.  ProBNP (last 3 results) No results for input(s): PROBNP in the last 8760 hours.  CBG: No results for input(s): GLUCAP in the last 168 hours.  No results found for this or any previous visit (from the past 240 hour(s)).   Studies: Dg Chest 1 View  10/08/2014   CLINICAL DATA:  Fall. Right leg injury. Hypertension. History of prostate cancer. Preoperative for right hip fracture.  EXAM: CHEST  1 VIEW  COMPARISON:  None.  FINDINGS: Mild enlargement of the cardiopericardial silhouette, without edema. Thoracic spondylosis. The lungs appear clear. No pleural effusion.  Fragmented spur from the left inferior glenoid. non-fragmented spurring of the right inferior glenoid.  IMPRESSION: 1. Mild enlargement of the cardiopericardial silhouette, without edema. 2. Thoracic spondylosis 3. Bilateral inferior glenoid spurring, with chronic fragmentation of the left inferior glenoid spur   Electronically Signed   By: Van Clines M.D.   On: 10/08/2014 09:46   Pelvis Portable  10/08/2014   CLINICAL DATA:  Status post fracture fixation proximal right femur fracture.  EXAM: PORTABLE PELVIS 1-2 VIEWS  COMPARISON:  Plain films of the right hip and pelvis 10/08/2014 at 9:19 a.m.  FINDINGS: There is partial visualization of a intra medullary nail and hip screw for fixation of a subtrochanteric right femur fracture. The fracture is not included on the exam. Visualized hardware is intact and no acute abnormality is identified.  IMPRESSION: Partial visualization of new fixation hardware for right femur fracture. No acute abnormality.   Electronically Signed   By: Inge Rise M.D.   On: 10/08/2014 18:09   Dg C-arm 1-60 Min  10/08/2014   CLINICAL DATA:  Internal fixation of the right femur fracture.  EXAM: DG C-ARM 61-120 MIN; RIGHT FEMUR 2 VIEWS  COMPARISON:  10/08/2014  FINDINGS: Intramedullary nail was placed in the right femur with 2 distal interlocking screws. There is a dynamic hip screw extending through the femoral head and neck. Again noted is a fracture in the subtrochanteric region of the proximal femur.  IMPRESSION: Internal fixation of the proximal right femur fracture.   Electronically Signed   By: Markus Daft M.D.   On: 10/08/2014 16:27   Dg Hip Unilat  With Pelvis 2-3 Views Right  10/08/2014   CLINICAL DATA:  Fall in shower this morning. Proximal right femur pain.  EXAM: RIGHT HIP (WITH PELVIS) 2-3 VIEWS  COMPARISON:  09/19/2014  FINDINGS: There is a fracture through the proximal shaft of the right femur, just below the intertrochanteric region. There is angulation with medial displacement of the femoral shaft relative to the femoral neck. No subluxation or dislocation. Mild degenerative changes in the hips bilaterally.  IMPRESSION: Proximal right femoral shaft fracture just inferior to the intertrochanteric region with varus angulation and displacement.   Electronically Signed   By: Rolm Baptise M.D.   On:  10/08/2014 09:44   Dg Femur, Min 2 Views Right  10/08/2014   CLINICAL DATA:  Internal fixation of the right femur fracture.  EXAM: DG C-ARM 61-120 MIN; RIGHT FEMUR 2 VIEWS  COMPARISON:  10/08/2014  FINDINGS: Intramedullary nail was placed in the right femur with 2 distal interlocking screws. There is a dynamic hip screw extending through the femoral head and neck. Again noted is a fracture in the subtrochanteric region of the proximal femur.  IMPRESSION: Internal fixation of the proximal right femur fracture.   Electronically Signed   By: Markus Daft M.D.   On: 10/08/2014 16:27   Dg Femur, Min 2 Views Right  10/08/2014   CLINICAL DATA:  Proximal femur fracture.  EXAM: RIGHT FEMUR 2 VIEWS  COMPARISON:  10/08/2014 hip radiographs  FINDINGS: These images include femur below the level of the fracture. The sub trochanteric fracture itself is described on the hip radiographs.  Patellar spurring. Mid femoral periosteal thickening is not acute and may be a manifestation of remote trauma,melorheostosis, or simply adductor insertion site spurring. No acute distal femoral findings.  IMPRESSION:  1. Aside from the proximal femoral fracture, no additional acute findings.   Electronically Signed   By: Van Clines M.D.   On: 10/08/2014 14:12   Dg Femur Port, Min 2 Views Right  10/08/2014   CLINICAL DATA:  ORIF of right femur  EXAM: RIGHT FEMUR PORTABLE 4  VIEW  COMPARISON:  October 08, 2014 12:32 p.m.  FINDINGS: A compression screw and femoral rod are identified through proximal femoral shaft fracture with no gross malalignment. Postsurgical changes including soft tissue air are identified.  IMPRESSION: Right femoral fixation without gross malalignment.   Electronically Signed   By: Abelardo Diesel M.D.   On: 10/08/2014 18:11    Scheduled Meds: . amLODipine  5 mg Oral Daily  . cholecalciferol  1,000 Units Oral Daily  . docusate sodium  100 mg Oral BID  . enoxaparin (LOVENOX) injection  40 mg Subcutaneous Q24H  .  hydrochlorothiazide  25 mg Oral Daily  . multivitamin with minerals  1 tablet Oral Daily  . omega-3 acid ethyl esters  1 g Oral Daily  . senna  1 tablet Oral BID  . vitamin C  1,000 mg Oral Daily    Continuous Infusions: . lactated ringers Stopped (10/08/14 1847)       Jeweline Reif  [facility] Pager [phone]. If 7PM-7AM, please contact night-coverage at [email or web address], password [facility] 10/09/2014, 2:30 PM  LOS: 1 day

## 2014-10-09 NOTE — Progress Notes (Signed)
   Subjective:  Patient reports pain as mild.  Denies N/V/CP/SOB.   Objective:   VITALS:   Filed Vitals:   10/08/14 2006 10/09/14 0016 10/09/14 0404 10/09/14 0608  BP: 137/97 135/65  149/56  Pulse: 77 65 64 65  Temp: 98.2 F (36.8 C) 98 F (36.7 C)  98.4 F (36.9 C)  TempSrc: Oral Oral  Oral  Resp:      Height:      Weight:      SpO2: 99% 100% 100% 99%    ABD soft Sensation intact distally Intact pulses distally Dorsiflexion/Plantar flexion intact Incision: dressing C/D/I Compartment soft  Lab Results  Component Value Date   WBC 6.9 10/09/2014   HGB 13.6 10/09/2014   HCT 40.4 10/09/2014   MCV 91.6 10/09/2014   PLT 164 10/09/2014   BMET    Component Value Date/Time   NA 139 10/09/2014 0548   K 3.9 10/09/2014 0548   CL 101 10/09/2014 0548   CO2 30 10/09/2014 0548   GLUCOSE 116* 10/09/2014 0548   BUN 15 10/09/2014 0548   CREATININE 1.13 10/09/2014 0548   CALCIUM 8.6 10/09/2014 0548   GFRNONAA 59* 10/09/2014 0548   GFRAA 68* 10/09/2014 0548     Assessment/Plan: 1 Day Post-Op   Principal Problem:   Subtrochanteric fracture of femur Active Problems:   Essential hypertension   PROSTATE CANCER, HX OF   Femur fracture, right  TDWB RLE with walker IV ancef x23 hrs DVT ppx: lovenox x30 days, SCDs, TEDs PO pain control Up with therapy Dispo: pending PT/OT eval, SNF vs  HH PT   Neftali Thurow, Horald Pollen 10/09/2014, 8:23 AM   Rod Can, MD Cell [phone]

## 2014-10-10 DIAGNOSIS — S7291XA Unspecified fracture of right femur, initial encounter for closed fracture: Secondary | ICD-10-CM

## 2014-10-10 DIAGNOSIS — M7989 Other specified soft tissue disorders: Secondary | ICD-10-CM

## 2014-10-10 LAB — CBC
HEMATOCRIT: 41.5 % (ref 39.0–52.0)
Hemoglobin: 13.8 g/dL (ref 13.0–17.0)
MCH: 31.2 pg (ref 26.0–34.0)
MCHC: 33.3 g/dL (ref 30.0–36.0)
MCV: 93.9 fL (ref 78.0–100.0)
Platelets: 161 10*3/uL (ref 150–400)
RBC: 4.42 MIL/uL (ref 4.22–5.81)
RDW: 14.5 % (ref 11.5–15.5)
WBC: 7.7 10*3/uL (ref 4.0–10.5)

## 2014-10-10 LAB — BASIC METABOLIC PANEL
Anion gap: 8 (ref 5–15)
BUN: 17 mg/dL (ref 6–23)
CHLORIDE: 100 mmol/L (ref 96–112)
CO2: 29 mmol/L (ref 19–32)
CREATININE: 1.06 mg/dL (ref 0.50–1.35)
Calcium: 9.1 mg/dL (ref 8.4–10.5)
GFR calc Af Amer: 73 mL/min — ABNORMAL LOW (ref >90)
GFR, EST NON AFRICAN AMERICAN: 63 mL/min — AB (ref >90)
Glucose, Bld: 119 mg/dL — ABNORMAL HIGH (ref 70–99)
Potassium: 3.3 mmol/L — ABNORMAL LOW (ref 3.5–5.1)
SODIUM: 137 mmol/L (ref 135–145)

## 2014-10-10 MED ORDER — POTASSIUM CHLORIDE CRYS ER 20 MEQ PO TBCR
40.0000 meq | EXTENDED_RELEASE_TABLET | Freq: Once | ORAL | Status: AC
  Administered 2014-10-10: 40 meq via ORAL
  Filled 2014-10-10: qty 2

## 2014-10-10 NOTE — Progress Notes (Signed)
Physical Therapy Treatment Patient Details Name: Cesar Powell MRN: [id number] DOB: 1931-02-04 Today's Date: 10/10/2014    History of Present Illness 81 y.o. male admitted to [facility] on 10/08/14 with suspected fracture and then fall of his right hip.  He is now s/p R hip IM nail and is TDWB (30%) per post op orders.  Pt with significant PMHx of HTN.      PT Comments    Pt demonstrated ability to ascend/descend 2 stairs w/ use of 1 crutch and is appropriate for d/c to home w/ HHPT rather than SNF from a PT standpoint.  Pt reports that his wife will be available 24/7 should he need assistance.   Follow Up Recommendations  Home health PT;Supervision/Assistance - 24 hour     Equipment Recommendations  3in1 (PT);Crutches    Recommendations for Other Services       Precautions / Restrictions Precautions Precautions: Fall Restrictions Weight Bearing Restrictions: Yes RLE Weight Bearing: Touchdown weight bearing RLE Partial Weight Bearing Percentage or Pounds: 30%    Mobility  Bed Mobility                  Transfers Overall transfer level: Needs assistance Equipment used: Rolling walker (2 wheeled) Transfers: Sit to/from Stand Sit to Stand: Supervision            Ambulation/Gait Ambulation/Gait assistance: Supervision Ambulation Distance (Feet): 80 Feet Assistive device: Rolling walker (2 wheeled) Gait Pattern/deviations: Step-to pattern;Antalgic (hopping on LLE (up to 30% BW on RLE)) Gait velocity: decreased Gait velocity interpretation: Below normal speed for age/gender     Stairs Stairs: Yes Stairs assistance: Min guard Stair Management: One rail Left;Forwards;Step to pattern;With crutches (one crutch) Number of Stairs: 2 General stair comments: min verbal cueing needed for proper sequencing  Wheelchair Mobility    Modified Rankin (Stroke Patients Only)       Balance Overall balance assessment: Needs assistance Sitting-balance support: No upper  extremity supported;Feet supported Sitting balance-Leahy Scale: Good     Standing balance support: Bilateral upper extremity supported Standing balance-Leahy Scale: Poor                      Cognition Arousal/Alertness: Awake/alert Behavior During Therapy: WFL for tasks assessed/performed Overall Cognitive Status: Within Functional Limits for tasks assessed                      Exercises Total Joint Exercises Ankle Circles/Pumps: AROM;Both;20 reps;Seated Towel Squeeze: AROM;Both;10 reps;Seated Marching in Standing: AROM;Right;5 reps;Standing    General Comments        Pertinent Vitals/Pain Pain Assessment: 0-10 Pain Score: 5  Pain Location: R hip Pain Descriptors / Indicators: Aching;Discomfort;Grimacing Pain Intervention(s): Limited activity within patient's tolerance;Monitored during session;Repositioned    Home Living                      Prior Function            PT Goals (current goals can now be found in the care plan section) Acute Rehab PT Goals Patient Stated Goal: get stronger PT Goal Formulation: With patient Time For Goal Achievement: 10/16/14 Potential to Achieve Goals: Good Progress towards PT goals: Progressing toward goals    Frequency  Min 5X/week    PT Plan Discharge plan needs to be updated (Pt appropriate for d/c to home w/ HHPT rather than SNF)    Co-evaluation  End of Session Equipment Utilized During Treatment: Gait belt Activity Tolerance: Patient tolerated treatment well Patient left: in chair;with call bell/phone within reach;with family/visitor present     Time: 1410-1500 PT Time Calculation (min) (ACUTE ONLY): 50 min  Charges:  $Gait Training: 23-37 mins $Therapeutic Exercise: 8-22 mins                    G CodesJoslyn Hy PT, [place] [phone] 10/10/2014, 3:11 PM

## 2014-10-10 NOTE — Progress Notes (Signed)
   Subjective:  Patient reports pain as mild.  Denies N/V/CP/SOB.   Objective:   VITALS:   Filed Vitals:   10/09/14 2100 10/09/14 2313 10/10/14 0400 10/10/14 0538  BP: 148/59   141/53  Pulse:    55  Temp:    98.2 F (36.8 C)  TempSrc:    Oral  Resp:  18 18 17   Height:      Weight:      SpO2:  100% 100% 100%    ABD soft Sensation intact distally Intact pulses distally Dorsiflexion/Plantar flexion intact Incision: dressing C/D/I Compartment soft  Lab Results  Component Value Date   WBC 7.7 10/10/2014   HGB 13.8 10/10/2014   HCT 41.5 10/10/2014   MCV 93.9 10/10/2014   PLT 161 10/10/2014   BMET    Component Value Date/Time   NA 139 10/09/2014 0548   K 3.9 10/09/2014 0548   CL 101 10/09/2014 0548   CO2 30 10/09/2014 0548   GLUCOSE 116* 10/09/2014 0548   BUN 15 10/09/2014 0548   CREATININE 1.13 10/09/2014 0548   CALCIUM 8.6 10/09/2014 0548   GFRNONAA 59* 10/09/2014 0548   GFRAA 68* 10/09/2014 0548     Assessment/Plan: 2 Days Post-Op   Principal Problem:   Subtrochanteric fracture of femur Active Problems:   Essential hypertension   PROSTATE CANCER, HX OF   Femur fracture, right  TDWB RLE with walker DVT ppx: lovenox x30 days, SCDs, TEDs PO pain control Up with therapy Dispo: SNF placement   Bellanie Matthew, Horald Pollen 10/10/2014, 7:25 AM   Rod Can, MD Cell [phone]

## 2014-10-10 NOTE — Progress Notes (Signed)
Occupational Therapy Treatment Patient Details Name: Cesar Powell MRN: [id number] DOB: 06-23-1931 Today's Date: 10/10/2014    History of present illness 80 y.o. male admitted to [facility] on 10/08/14 with suspected fracture and then fall of his right hip.  He is now s/p R hip IM nail and is TDWB (30%) per post op orders.  Pt with significant PMHx of HTN.     OT comments  Patient progressing towards goals, continue plan of care for now. Patient continues to require up to min guard for tasks and min verbal cues for safe and effective technique. Continue to believe patient will be a good candidate for home with [facility]. Patient's wife will be home and can provide 24/7 supervision/assistance.     Follow Up Recommendations  Home health OT;Supervision/Assistance - 24 hour    Equipment Recommendations  3 in 1 bedside comode;Other (comment) (AE - reacher, sock aid, LH sponge, LH shoe horn)    Recommendations for Other Services  None at this time    Precautions / Restrictions Precautions Precautions: Fall Restrictions Weight Bearing Restrictions: Yes RLE Weight Bearing: Touchdown weight bearing RLE Partial Weight Bearing Percentage or Pounds: 30%       Mobility Bed Mobility       General bed mobility comments: Patient seated in recliner upon OT entering room  Transfers Overall transfer level: Needs assistance Equipment used: Rolling walker (2 wheeled) Transfers: Sit to/from [facility] Sit to Stand: Min guard Stand pivot transfers: Min guard       General transfer comment: Verbal cues for safe and effective technique     Balance Overall balance assessment: Needs assistance Sitting-balance support: Feet supported;Bilateral upper extremity supported Sitting balance-Leahy Scale: Good     Standing balance support: Bilateral upper extremity supported;During functional activity Standing balance-Leahy Scale: Fair    ADL General ADL Comments: Demonstrated use of AE  (reacher, sock aid, LH sponge, LH shoe horn) for LB ADLs. Patient performed teach back with use of reacher and sock aid. Patient also performed simulated walk-in shower transfer using simulated block and seat. Patient min assist for this and required min verbal cues for safe and effective technique. Patient presents with good problem solving abilities and good awarness. Continue to believe patient will be a good canidate to go home with [place].      Cognition   Behavior During Therapy: WFL for tasks assessed/performed Overall Cognitive Status: Within Functional Limits for tasks assessed                 Pertinent Vitals/ Pain       Pain Assessment: No/denies pain         Frequency Min 2X/week     Progress Toward Goals  OT Goals(current goals can now be found in the care plan section)  Progress towards OT goals: Progressing toward goals     Plan Discharge plan remains appropriate       End of Session Equipment Utilized During Treatment: Rolling walker;Gait belt   Activity Tolerance Patient tolerated treatment well   Patient Left in chair;with call bell/phone within reach     Time: 0921-0954 OT Time Calculation (min): 33 min  Charges: OT General Charges $OT Visit: 1 Procedure OT Treatments $Self Care/Home Management : 23-37 mins  Paulette Lynch , MS, OTR/L, CLT Pager: [phone]  10/10/2014, 10:03 AM

## 2014-10-10 NOTE — Progress Notes (Signed)
PROGRESS NOTE  Cesar Powell MOQ:[id number] DOB: 05/13/1931 DOA: 10/08/2014 PCP: Owens Loffler, MD  HPI: Cesar Powell is a 81 y.o. male with a Past Medical History of hypertension, prostate cancer who presented with fall and right leg pain, found to have a femur fracture  Subjective / 24 H Interval events - doing well this morning, pain controlled, denies chest pain/dyspnea  Assessment/Plan: Principal Problem:   Subtrochanteric fracture of femur Active Problems:   Essential hypertension   PROSTATE CANCER, HX OF   Femur fracture, right  Right subtrochanteric femur fracture - s/p IM nail placement per ortho on 3/8 - doing well post op - TDWB, patient needs SNF, placed consult for SW  HTN - continue HCTZ and Norvasc  History of prostate cancer - s/p radiation, currently on testosterone supplement with close urology follow up per patient report  Left calf swelling - for few weeks, will r/o DVT with [place]  Diet: Diet regular Diet - low sodium heart healthy Fluids: none  DVT Prophylaxis: Lovenox  Code Status: Full Code Family Communication: none bedside  Disposition Plan: SNF when ready, 1-2 days   Consultants:  Orthopedic surgery   Procedures:  ORIF right femoral fx 3/8   Antibiotics  Anti-infectives    Start     Dose/Rate Route Frequency Ordered Stop   10/08/14 1930  ceFAZolin (ANCEF) IVPB 2 g/50 mL premix     2 g 100 mL/hr over 30 Minutes Intravenous Every 6 hours 10/08/14 1824 10/09/14 0357       Studies  Pelvis Portable  10/08/2014   CLINICAL DATA:  Status post fracture fixation proximal right femur fracture.  EXAM: PORTABLE PELVIS 1-2 VIEWS  COMPARISON:  Plain films of the right hip and pelvis 10/08/2014 at 9:19 a.m.  FINDINGS: There is partial visualization of a intra medullary nail and hip screw for fixation of a subtrochanteric right femur fracture. The fracture is not included on the exam. Visualized hardware is intact and no acute abnormality is  identified.  IMPRESSION: Partial visualization of new fixation hardware for right femur fracture. No acute abnormality.   Electronically Signed   By: Inge Rise M.D.   On: 10/08/2014 18:09   Dg C-arm 1-60 Min  10/08/2014   CLINICAL DATA:  Internal fixation of the right femur fracture.  EXAM: DG C-ARM 61-120 MIN; RIGHT FEMUR 2 VIEWS  COMPARISON:  10/08/2014  FINDINGS: Intramedullary nail was placed in the right femur with 2 distal interlocking screws. There is a dynamic hip screw extending through the femoral head and neck. Again noted is a fracture in the subtrochanteric region of the proximal femur.  IMPRESSION: Internal fixation of the proximal right femur fracture.   Electronically Signed   By: Markus Daft M.D.   On: 10/08/2014 16:27   Dg Femur, Min 2 Views Right  10/08/2014   CLINICAL DATA:  Internal fixation of the right femur fracture.  EXAM: DG C-ARM 61-120 MIN; RIGHT FEMUR 2 VIEWS  COMPARISON:  10/08/2014  FINDINGS: Intramedullary nail was placed in the right femur with 2 distal interlocking screws. There is a dynamic hip screw extending through the femoral head and neck. Again noted is a fracture in the subtrochanteric region of the proximal femur.  IMPRESSION: Internal fixation of the proximal right femur fracture.   Electronically Signed   By: Markus Daft M.D.   On: 10/08/2014 16:27   Dg Femur, Min 2 Views Right  10/08/2014   CLINICAL DATA:  Proximal femur fracture.  EXAM:  RIGHT FEMUR 2 VIEWS  COMPARISON:  10/08/2014 hip radiographs  FINDINGS: These images include femur below the level of the fracture. The sub trochanteric fracture itself is described on the hip radiographs.  Patellar spurring. Mid femoral periosteal thickening is not acute and may be a manifestation of remote trauma,melorheostosis, or simply adductor insertion site spurring. No acute distal femoral findings.  IMPRESSION: 1. Aside from the proximal femoral fracture, no additional acute findings.   Electronically Signed   By:  Van Clines M.D.   On: 10/08/2014 14:12   Dg Femur Port, Min 2 Views Right  10/08/2014   CLINICAL DATA:  ORIF of right femur  EXAM: RIGHT FEMUR PORTABLE 4  VIEW  COMPARISON:  October 08, 2014 12:32 p.m.  FINDINGS: A compression screw and femoral rod are identified through proximal femoral shaft fracture with no gross malalignment. Postsurgical changes including soft tissue air are identified.  IMPRESSION: Right femoral fixation without gross malalignment.   Electronically Signed   By: Abelardo Diesel M.D.   On: 10/08/2014 18:11    Objective  Filed Vitals:   10/09/14 2100 10/09/14 2313 10/10/14 0400 10/10/14 0538  BP: 148/59   141/53  Pulse:    55  Temp:    98.2 F (36.8 C)  TempSrc:    Oral  Resp:  18 18 17   Height:      Weight:      SpO2:  100% 100% 100%    Intake/Output Summary (Last 24 hours) at 10/10/14 1251 Last data filed at 10/10/14 0144  Gross per 24 hour  Intake    720 ml  Output   1100 ml  Net   -380 ml   Filed Weights   10/08/14 0844  Weight: 99.338 kg (219 lb)    Exam:  General:  NAD  HEENT: no scleral icterus  Cardiovascular: RRR without MRG, no edema  Respiratory: CTA biL  Abdomen: soft, non tender  MSK/Extremities: no clubbing  Skin: no rashes  Neuro: non focal  Data Reviewed: Basic Metabolic Panel:  Recent Labs Lab 10/08/14 0859 10/09/14 0548 10/10/14 0615  NA 136 139 137  K 3.6 3.9 3.3*  CL 99 101 100  CO2 27 30 29   GLUCOSE 142* 116* 119*  BUN 21 15 17   CREATININE 1.23 1.13 1.06  CALCIUM 9.2 8.6 9.1   CBC:  Recent Labs Lab 10/08/14 0859 10/09/14 0548 10/10/14 0615  WBC 11.5* 6.9 7.7  NEUTROABS 10.1*  --   --   HGB 15.8 13.6 13.8  HCT 46.1 40.4 41.5  MCV 91.5 91.6 93.9  PLT 192 164 161    Scheduled Meds: . amLODipine  5 mg Oral Daily  . cholecalciferol  1,000 Units Oral Daily  . docusate sodium  100 mg Oral BID  . enoxaparin (LOVENOX) injection  40 mg Subcutaneous Q24H  . hydrochlorothiazide  25 mg Oral Daily  .  multivitamin with minerals  1 tablet Oral Daily  . omega-3 acid ethyl esters  1 g Oral Daily  . senna  1 tablet Oral BID  . vitamin C  1,000 mg Oral Daily   Continuous Infusions:   Marzetta Board, MD [facility] Pager [phone]. If 7 PM - 7 AM, please contact night-coverage at [email or web address], password [facility] 10/10/2014, 12:51 PM  LOS: 2 days

## 2014-10-10 NOTE — Progress Notes (Signed)
*  PRELIMINARY RESULTS* Vascular Ultrasound Lower extremity venous duplex has been completed.  Preliminary findings: No evidence of DVT.   Landry Mellow, RDMS, RVT  10/10/2014, 3:18 PM

## 2014-10-11 DIAGNOSIS — S7221XD Displaced subtrochanteric fracture of right femur, subsequent encounter for closed fracture with routine healing: Secondary | ICD-10-CM

## 2014-10-11 LAB — BASIC METABOLIC PANEL
ANION GAP: 10 (ref 5–15)
BUN: 18 mg/dL (ref 6–23)
CALCIUM: 9.3 mg/dL (ref 8.4–10.5)
CO2: 28 mmol/L (ref 19–32)
CREATININE: 1.11 mg/dL (ref 0.50–1.35)
Chloride: 99 mmol/L (ref 96–112)
GFR, EST AFRICAN AMERICAN: 69 mL/min — AB (ref >90)
GFR, EST NON AFRICAN AMERICAN: 60 mL/min — AB (ref >90)
Glucose, Bld: 105 mg/dL — ABNORMAL HIGH (ref 70–99)
POTASSIUM: 3.9 mmol/L (ref 3.5–5.1)
Sodium: 137 mmol/L (ref 135–145)

## 2014-10-11 LAB — CBC
HCT: 42.8 % (ref 39.0–52.0)
HEMOGLOBIN: 14.3 g/dL (ref 13.0–17.0)
MCH: 31.4 pg (ref 26.0–34.0)
MCHC: 33.4 g/dL (ref 30.0–36.0)
MCV: 93.9 fL (ref 78.0–100.0)
Platelets: 183 10*3/uL (ref 150–400)
RBC: 4.56 MIL/uL (ref 4.22–5.81)
RDW: 14.6 % (ref 11.5–15.5)
WBC: 8.8 10*3/uL (ref 4.0–10.5)

## 2014-10-11 MED ORDER — RIVAROXABAN 10 MG PO TABS: 10.0000 mg | ORAL_TABLET | Freq: Every day | ORAL | Status: DC

## 2014-10-11 NOTE — Discharge Instructions (Signed)
Dr. Rod Can Adult Hip & Knee Specialist [facility] [address], [place], [place] [address] [phone]    FEMORAL NAIL POSTOPERATIVE DIRECTIONS   Hip Rehabilitation, Guidelines Following Surgery  The results of a hip operation are greatly improved after range of motion and muscle strengthening exercises. Follow all safety measures which are given to protect your hip. If any of these exercises cause increased pain or swelling in your joint, decrease the amount until you are comfortable again. Then slowly increase the exercises. Call your caregiver if you have problems or questions.  HOME CARE INSTRUCTIONS  Most of the following instructions are designed to prevent the dislocation of your new hip.  Remove items at home which could result in a fall. This includes throw rugs or furniture in walking pathways.  Continue medications as instructed at time of discharge.  You may have some home medications which will be placed on hold until you complete the course of blood thinner medication.  You may start showering once you are discharged home but do not submerge the incision under water. Just pat the incision dry and apply a dry gauze dressing on daily. Do not put on socks or shoes without following the instructions of your caregivers.  Sit on high chairs which makes it easier to stand.  Sit on chairs with arms. Use the chair arms to help push yourself up when arising.  Keep your leg on the side of the operation out in front of you when standing up.  Arrange for the use of a toilet seat elevator so you are not sitting low.    Use a walker as instructed.  Use walker as long as suggested by your caregivers.  Touch Down Weight Bearing Right Leg. Avoid periods of inactivity such as sitting longer than an hour when not asleep. This helps prevent blood clots.  You may return to work once you are cleared by [employer], production.  Do not drive a car until you are allowed  to fully weight bear.  Do not drive while taking narcotics.  Wear elastic stockings for three weeks following surgery during the day but you may remove then at night.  Make sure you keep all of your appointments after your operation with all of your doctors and caregivers. You should call the office at the above phone number and make an appointment for approximately two weeks after the date of your surgery. Change the dressing daily and reapply a dry dressing each time. Please pick up a stool softener and laxative for home use as long as you are requiring pain medications.  ICE to the affected hip every three hours for 30 minutes at a time and then as needed for pain and swelling.  Continue to use ice on the hip for pain and swelling from surgery. You may notice swelling that will progress down to the foot and ankle.  This is normal after surgery.  Elevate the leg when you are not up walking on it.   It is important for you to complete the blood thinner medication as prescribed by your doctor.  Continue to use the breathing machine which will help keep your temperature down.  It is common for your temperature to cycle up and down following surgery, especially at night when you are not up moving around and exerting yourself.  The breathing machine keeps your lungs expanded and your temperature down.  RANGE OF MOTION AND STRENGTHENING EXERCISES  These exercises are designed to help  you keep full movement of your hip joint. Follow your caregiver's or physical therapist's instructions. Perform all exercises about fifteen times, three times per day or as directed. Exercise both hips, even if you have had only one joint replacement. These exercises can be done on a training (exercise) mat, on the floor, on a table or on a bed. Use whatever works the best and is most comfortable for you. Use music or television while you are exercising so that the exercises are a pleasant break in your day. This will make your  life better with the exercises acting as a break in routine you can look forward to.  Lying on your back, slowly slide your foot toward your buttocks, raising your knee up off the floor. Then slowly slide your foot back down until your leg is straight again.  Lying on your back spread your legs as far apart as you can without causing discomfort.  Lying on your side, raise your upper leg and foot straight up from the floor as far as is comfortable. Slowly lower the leg and repeat.  Lying on your back, tighten up the muscle in the front of your thigh (quadriceps muscles). You can do this by keeping your leg straight and trying to raise your heel off the floor. This helps strengthen the largest muscle supporting your knee.  Lying on your back, tighten up the muscles of your buttocks both with the legs straight and with the knee bent at a comfortable angle while keeping your heel on the floor.   SKILLED REHAB INSTRUCTIONS: If the patient is transferred to a skilled rehab facility following release from the hospital, a list of the current medications will be sent to the facility for the patient to continue.  When discharged from the skilled rehab facility, please have the facility set up the patient's [place] prior to being released. Also, the skilled facility will be responsible for providing the patient with their medications at time of release from the facility to include their pain medication, the muscle relaxants, and their blood thinner medication. If the patient is still at the rehab facility at time of the two week follow up appointment, the skilled rehab facility will also need to assist the patient in arranging follow up appointment in our office and any transportation needs.  MAKE SURE YOU:  Understand these instructions.  Will watch your condition.  Will get help right away if you are not doing well or get worse.    Touch Down Weight Bearing Right Leg (30 %) Pick up  stool softner and laxative for home use following surgery while on pain medications. Do not submerge incision under water. Please use good hand washing techniques while changing dressing each day. May shower starting three days after surgery. Please use a clean towel to pat the incision dry following showers. Continue to use ice for pain and swelling after surgery. Do not use any lotions or creams on the incision until instructed by your surgeon.    Follow with Owens Loffler, MD in 5-7 days  Please get a complete blood count and chemistry panel checked by your Primary MD at your next visit, and again as instructed by your Primary MD. Please get your medications reviewed and adjusted by your Primary MD.  Please request your Primary MD to go over all Hospital Tests and Procedure/Radiological results at the follow up, please get all Hospital records sent to your Prim MD by signing hospital release before you  go home.  If you had Pneumonia of Lung problems at the Hospital: Please get a 2 view Chest X ray done in 6-8 weeks after hospital discharge or sooner if instructed by your Primary MD.  If you have Congestive Heart Failure: Please call your Cardiologist or Primary MD anytime you have any of the following symptoms:  1) 3 pound weight gain in 24 hours or 5 pounds in 1 week  2) shortness of breath, with or without a dry hacking cough  3) swelling in the hands, feet or stomach  4) if you have to sleep on extra pillows at night in order to breathe  Follow cardiac low salt diet and 1.5 lit/day fluid restriction.  If you have diabetes Accuchecks 4 times/day, Once in AM empty stomach and then before each meal. Log in all results and show them to your primary doctor at your next visit. If any glucose reading is under 80 or above 300 call your primary MD immediately.  If you have Seizure/Convulsions/Epilepsy: Please do not drive, operate heavy machinery, participate in activities at heights  or participate in high speed sports until you have seen by Primary MD or a Neurologist and advised to do so again.  If you had Gastrointestinal Bleeding: Please ask your Primary MD to check a complete blood count within one week of discharge or at your next visit. Your endoscopic/colonoscopic biopsies that are pending at the time of discharge, will also need to followed by your Primary MD.  Get Medicines reviewed and adjusted. Please take all your medications with you for your next visit with your Primary MD  Please request your Primary MD to go over all hospital tests and procedure/radiological results at the follow up, please ask your Primary MD to get all Hospital records sent to his/her office.  If you experience worsening of your admission symptoms, develop shortness of breath, life threatening emergency, suicidal or homicidal thoughts you must seek medical attention immediately by calling 911 or calling your MD immediately  if symptoms less severe.  You must read complete instructions/literature along with all the possible adverse reactions/side effects for all the Medicines you take and that have been prescribed to you. Take any new Medicines after you have completely understood and accpet all the possible adverse reactions/side effects.   Do not drive or operate heavy machinery when taking Pain medications.   Do not take more than prescribed Pain, Sleep and Anxiety Medications  Special Instructions: If you have smoked or chewed Tobacco  in the last 2 yrs please stop smoking, stop any regular Alcohol  and or any Recreational drug use.  Wear Seat belts while driving.  Please note You were cared for by a hospitalist during your hospital stay. If you have any questions about your discharge medications or the care you received while you were in the hospital after you are discharged, you can call the unit and asked to speak with the hospitalist on call if the hospitalist that took care of  you is not available. Once you are discharged, your primary care physician will handle any further medical issues. Please note that NO REFILLS for any discharge medications will be authorized once you are discharged, as it is imperative that you return to your primary care physician (or establish a relationship with a primary care physician if you do not have one) for your aftercare needs so that they can reassess your need for medications and monitor your lab values.  You can reach the hospitalist  office at phone [phone] or fax [phone]   If you do not have a primary care physician, you can call [phone] for a physician referral.  Activity: As tolerated with Full fall precautions use walker/cane & assistance as needed  Diet: regular  Disposition Home

## 2014-10-11 NOTE — Progress Notes (Signed)
   Subjective:  Patient reports pain as mild.  Denies N/V/CP/SOB.  Making excellent progress with PT/OT --> rec d/c home.  Objective:   VITALS:   Filed Vitals:   10/10/14 2029 10/10/14 2347 10/11/14 0400 10/11/14 0558  BP: 152/56   145/57  Pulse: 64   57  Temp: 99.3 F (37.4 C)   97.8 F (36.6 C)  TempSrc: Oral   Oral  Resp:  18 18   Height:      Weight:      SpO2: 100% 100% 100% 100%    ABD soft Sensation intact distally Intact pulses distally Dorsiflexion/Plantar flexion intact Incision: dressing C/D/I Compartment soft  Lab Results  Component Value Date   WBC 7.7 10/10/2014   HGB 13.8 10/10/2014   HCT 41.5 10/10/2014   MCV 93.9 10/10/2014   PLT 161 10/10/2014   BMET    Component Value Date/Time   NA 137 10/10/2014 0615   K 3.3* 10/10/2014 0615   CL 100 10/10/2014 0615   CO2 29 10/10/2014 0615   GLUCOSE 119* 10/10/2014 0615   BUN 17 10/10/2014 0615   CREATININE 1.06 10/10/2014 0615   CALCIUM 9.1 10/10/2014 0615   GFRNONAA 63* 10/10/2014 0615   GFRAA 73* 10/10/2014 0615     Assessment/Plan: 3 Days Post-Op   Principal Problem:   Subtrochanteric fracture of femur Active Problems:   Essential hypertension   PROSTATE CANCER, HX OF   Femur fracture, right  TDWB RLE with walker DVT ppx: lovenox x30 days, SCDs, TEDs PO pain control Up with therapy Dispo: home with [facility] PT   Beniah Magnan, Horald Pollen 10/11/2014, 6:36 AM   Rod Can, MD Cell [phone]

## 2014-10-11 NOTE — Progress Notes (Signed)
Orthopedic Tech Progress Note Patient Details:  Cesar Powell 08-Jan-1931 [id number]  Ortho Devices Type of Ortho Device: Crutches Ortho Device/Splint Interventions: Application   Hildred Priest 10/11/2014, 12:42 PM

## 2014-10-11 NOTE — Progress Notes (Signed)
CARE MANAGEMENT NOTE 10/11/2014  Patient:  QUINTERIOUS, WALRAVEN   Account Number:  [id number]  Date Initiated:  10/11/2014  Documentation initiated by:  [facility]  Subjective/Objective Assessment:   rt femur fractue,s/p IM nail     Action/Plan:   PT/OT evals- recommended HHPT and HHOT   Anticipated DC Date:  10/11/2014   Anticipated DC Plan:  [place]  CM consult      [facility] Choice  [place]   Choice offered to / List presented to:  C-1 Patient   DME arranged  3-N-1      DME agency  [place] arranged  [place].   Status of service:  Completed, signed off Medicare Important Message given?  YES Date Medicare IM given:  10/11/2014 Medicare IM given by:  [facility]  Discharge Disposition:  [place]  Per UR Regulation:  Reviewed for med. necessity/level of care/duration of stay   Comments:  10/11/14 Spoke with patient and his wife about [place] on 10/10/14, they chose [facility]. Contacted Miranda with [facility] and set up [place]. Patient has a rolling walker at home, contacted Pilar Plate at [facility] and requested 3N1 which was delivered to patient's room. Patient's wife will be able to assist after d/c.

## 2014-10-11 NOTE — Progress Notes (Signed)
Physical Therapy Treatment Patient Details Name: Cesar Powell MRN: [id number] DOB: 02-02-1931 Today's Date: 10/11/2014    History of Present Illness 81 y.o. male admitted to [facility] on 10/08/14 with suspected fracture and then fall of his right hip.  He is now s/p R hip IM nail and is TDWB (30%) per post op orders.  Pt with significant PMHx of HTN.      PT Comments    Pt tolerated treatment well today and only required min verbal cues to slow down when turning so as to maintain balance.  Pt is anticipating d/c to home w/ HHPT today and awaiting delivery of his crutches.    Follow Up Recommendations  Home health PT;Supervision/Assistance - 24 hour     Equipment Recommendations  3in1 (PT);Crutches    Recommendations for Other Services       Precautions / Restrictions Precautions Precautions: Fall Restrictions Weight Bearing Restrictions: Yes RLE Weight Bearing: Touchdown weight bearing RLE Partial Weight Bearing Percentage or Pounds: 30%    Mobility  Bed Mobility                  Transfers Overall transfer level: Needs assistance Equipment used: Rolling walker (2 wheeled) Transfers: Sit to/from Stand Sit to Stand: Supervision         General transfer comment: Pt demonstrates proper control w/ stand>sit  Ambulation/Gait Ambulation/Gait assistance: Supervision Ambulation Distance (Feet): 200 Feet Assistive device: Rolling walker (2 wheeled) Gait Pattern/deviations: Step-to pattern;Antalgic (hop on LLE w/ occasional TDWB on RLE limited to 30%) Gait velocity: decreased Gait velocity interpretation: Below normal speed for age/gender General Gait Details: verbal cues to take turns slowly to ensure balance is maintained   Stairs            Wheelchair Mobility    Modified Rankin (Stroke Patients Only)       Balance Overall balance assessment: Needs assistance Sitting-balance support: No upper extremity supported;Feet supported Sitting balance-Leahy  Scale: Good     Standing balance support: Single extremity supported Standing balance-Leahy Scale: Fair                      Cognition Arousal/Alertness: Awake/alert Behavior During Therapy: WFL for tasks assessed/performed Overall Cognitive Status: Within Functional Limits for tasks assessed                      Exercises Total Joint Exercises Ankle Circles/Pumps: AROM;Both;20 reps;Seated Long Arc Quad: AROM;Right;5 reps;Seated Marching in Standing: AROM;Right;5 reps;Standing Standing Hip Extension: AROM;Right;10 reps;Standing    General Comments        Pertinent Vitals/Pain Pain Assessment: No/denies pain Pain Intervention(s): Monitored during session;Limited activity within patient's tolerance;Repositioned    Home Living                      Prior Function            PT Goals (current goals can now be found in the care plan section) Acute Rehab PT Goals Patient Stated Goal: get stronger Progress towards PT goals: Progressing toward goals    Frequency  Min 5X/week    PT Plan Discharge plan needs to be updated (home w/ HHPT)    Co-evaluation             End of Session   Activity Tolerance: Patient tolerated treatment well Patient left: in chair;with call bell/phone within reach     Time: 1022-1040 PT Time Calculation (min) (ACUTE ONLY): 18 min  Charges:  $Gait Training: 8-22 mins                    G CodesJoslyn Hy PT, [place] [phone] #[phone] 10/11/2014, 10:48 AM

## 2014-10-11 NOTE — Discharge Summary (Signed)
Physician Discharge Summary  KUPER RENNELS RUE:[id number] DOB: 1930/10/23 DOA: 10/08/2014  PCP: Owens Loffler, MD  Admit date: 10/08/2014 Discharge date: 10/11/2014  Time spent: 45 minutes  Recommendations for Outpatient Follow-up:  1. Follow up with Dr. Lorelei Pont in 2 weeks 2. Follow up with Dr. Lyla Glassing in 2 weeks 3. Home health PT   Discharge Diagnoses:  Principal Problem:   Subtrochanteric fracture of femur Active Problems:   Essential hypertension   PROSTATE CANCER, HX OF   Femur fracture, right  Discharge Condition: stable  Diet recommendation: regular   Filed Weights   10/08/14 0844  Weight: 99.338 kg (219 lb)   History of present illness:  Cesar FREILICH is a 81 y.o. male with a Past Medical History of hypertension, prostate cancer who presents today with the above noted complaint. Per patient, approximately 3-4 weeks ago, while working out in the gym in [place] he claims he injured his right leg following which she has been having pain and has had difficultly putting weight on the right leg. A x-ray done in the outpatient setting during that time was negative for fractures, patient was then referred to physical therapy. This morning, while in the bathroom, he rotated to the right side and he felt his legs go weak causing the fall. He subsequently had severe right-sided hip pain. He was subsequently brought to the emergency department, where x-ray of the right hip showed a proximal right femoral fracture. I was asked to admit this patient for further evaluation and treatment Patient denies any syncopal episode this morning. He denies any chest pain, shortness of breath, palpitations. No history of nausea, vomiting or diarrhea. Patient is normally very active, and goes to the gym 3 times a week. He is in no prior history of CAD, CVA, chronic kidney disease or pulmonary embolism.  Hospital Course:  Right subtrochanteric femur fracture - orthopedic surgery was consulted  and have followed patient while hospitalized. He is now s/p IM nail placement per ortho on 3/8. He recovered well post operatively and has shown significant improvement when working with physical therapy. Their initial recommendation was SNF however on subsequent visits patient able to ambulate with crutches and he was discharged with home health services. He wil be placed on Xarelto 10 mg daily for DVT prophylaxis per orthopedic surgery for 28 days.  HTN - continue HCTZ and Norvasc History of prostate cancer - s/p radiation, currently on testosterone supplement with close urology follow up per patient report Left calf swelling - for few weeks, DVT US negative  Procedures:  ORIF right femoral fx 3/8   Consultations:  Orthopedic surgery   Discharge Exam: Filed Vitals:   10/10/14 2347 10/11/14 0400 10/11/14 0558 10/11/14 0800  BP:   145/57   Pulse:   57   Temp:   97.8 F (36.6 C)   TempSrc:   Oral   Resp: 18 18  18   Height:      Weight:      SpO2: 100% 100% 100% 97%   General: NAD Cardiovascular: RRR Respiratory: CTA biL  Discharge Instructions  Discharge Instructions    Call MD / Call 911    Complete by:  As directed   If you experience chest pain or shortness of breath, CALL 911 and be transported to the hospital emergency room.  If you develope a fever above 101 F, pus (white drainage) or increased drainage or redness at the wound, or calf pain, call your surgeon's office.  Constipation Prevention    Complete by:  As directed   Drink plenty of fluids.  Prune juice may be helpful.  You may use a stool softener, such as Colace (over the counter) 100 mg twice a day.  Use MiraLax (over the counter) for constipation as needed.     DO NOT drive, shower or take a tub bath until instructed by your physician    Complete by:  As directed      Diet - low sodium heart healthy    Complete by:  As directed      Discharge wound care:    Complete by:  As directed   If you have a hip  bandage, keep it clean and dry.  Change your bandage as instructed by your health care providers.  If your bandage has been discontinued, keep your incision clean and dry.  Pat dry after bathing.  DO NOT put lotion or powder on your incision.     Do not sit on low chairs, stoools or toilet seats, as it may be difficult to get up from low surfaces    Complete by:  As directed      Increase activity slowly as tolerated    Complete by:  As directed             Medication List    STOP taking these medications        ibuprofen 200 MG tablet  Commonly known as:  ADVIL,MOTRIN      TAKE these medications        amLODipine 5 MG tablet  Commonly known as:  NORVASC  TAKE 1 TABLET (5 MG TOTAL) BY MOUTH DAILY.     AXIRON 30 MG/ACT Soln  Generic drug:  Testosterone  Apply 1 application topically daily.     cholecalciferol 1000 UNITS tablet  Commonly known as:  VITAMIN D  Take 1,000 Units by mouth daily.     Fish Oil 1200 MG Caps  Take 1,200 mg by mouth daily.     GLUCOSAMINE CHONDR COMPLEX PO  Take 2 capsules by mouth daily.     hydrochlorothiazide 25 MG tablet  Commonly known as:  HYDRODIURIL  Take 1 tablet (25 mg total) by mouth daily.     HYDROcodone-acetaminophen 5-325 MG per tablet  Commonly known as:  NORCO/VICODIN  Take 1-2 tablets by mouth every 4 (four) hours as needed for moderate pain.     multivitamin capsule  Take 1 capsule by mouth daily.     rivaroxaban 10 MG Tabs tablet  Commonly known as:  XARELTO  Take 1 tablet (10 mg total) by mouth daily.     vitamin C 1000 MG tablet  Take 1,000 mg by mouth daily.           Follow-up Information    Follow up with Swinteck, Horald Pollen, MD. Schedule an appointment as soon as possible for a visit in 2 weeks.   Specialty:  Orthopedic Surgery   Why:  For wound re-check   Contact information:   [place] [address] [phone]       Follow up with Owens Loffler, MD. Schedule an  appointment as soon as possible for a visit in 1 month.   Specialty:  Family Medicine   Contact information:   [place] [address] [phone]       Follow up with [facility].   Why:  They  will contact you to schedule home therapy visits.   Contact information:   [address] [place] [address] [phone]       The results of significant diagnostics from this hospitalization (including imaging, microbiology, ancillary and laboratory) are listed below for reference.    Significant Diagnostic Studies: Dg Chest 1 View  10/08/2014   CLINICAL DATA:  Fall. Right leg injury. Hypertension. History of prostate cancer. Preoperative for right hip fracture.  EXAM: CHEST  1 VIEW  COMPARISON:  None.  FINDINGS: Mild enlargement of the cardiopericardial silhouette, without edema. Thoracic spondylosis. The lungs appear clear. No pleural effusion.  Fragmented spur from the left inferior glenoid. non-fragmented spurring of the right inferior glenoid.  IMPRESSION: 1. Mild enlargement of the cardiopericardial silhouette, without edema. 2. Thoracic spondylosis 3. Bilateral inferior glenoid spurring, with chronic fragmentation of the left inferior glenoid spur   Electronically Signed   By: Van Clines M.D.   On: 10/08/2014 09:46   Pelvis Portable  10/08/2014   CLINICAL DATA:  Status post fracture fixation proximal right femur fracture.  EXAM: PORTABLE PELVIS 1-2 VIEWS  COMPARISON:  Plain films of the right hip and pelvis 10/08/2014 at 9:19 a.m.  FINDINGS: There is partial visualization of a intra medullary nail and hip screw for fixation of a subtrochanteric right femur fracture. The fracture is not included on the exam. Visualized hardware is intact and no acute abnormality is identified.  IMPRESSION: Partial visualization of new fixation hardware for right femur fracture. No acute abnormality.   Electronically Signed   By:  Inge Rise M.D.   On: 10/08/2014 18:09   Dg C-arm 1-60 Min  10/08/2014   CLINICAL DATA:  Internal fixation of the right femur fracture.  EXAM: DG C-ARM 61-120 MIN; RIGHT FEMUR 2 VIEWS  COMPARISON:  10/08/2014  FINDINGS: Intramedullary nail was placed in the right femur with 2 distal interlocking screws. There is a dynamic hip screw extending through the femoral head and neck. Again noted is a fracture in the subtrochanteric region of the proximal femur.  IMPRESSION: Internal fixation of the proximal right femur fracture.   Electronically Signed   By: Markus Daft M.D.   On: 10/08/2014 16:27   Dg Hip Unilat  With Pelvis 2-3 Views Right  10/08/2014   CLINICAL DATA:  Fall in shower this morning. Proximal right femur pain.  EXAM: RIGHT HIP (WITH PELVIS) 2-3 VIEWS  COMPARISON:  09/19/2014  FINDINGS: There is a fracture through the proximal shaft of the right femur, just below the intertrochanteric region. There is angulation with medial displacement of the femoral shaft relative to the femoral neck. No subluxation or dislocation. Mild degenerative changes in the hips bilaterally.  IMPRESSION: Proximal right femoral shaft fracture just inferior to the intertrochanteric region with varus angulation and displacement.   Electronically Signed   By: Rolm Baptise M.D.   On: 10/08/2014 09:44   Dg Hip Unilat With Pelvis 2-3 Views Right  09/19/2014   CLINICAL DATA:  Right hip pain for 3 weeks, no trauma  EXAM: RIGHT HIP (WITH PELVIS) 2-3 VIEWS  COMPARISON:  None.  FINDINGS: No acute fracture is seen. There is moderate degenerative joint disease of both hips present with some loss of joint space and spurring. The pelvic rami are intact. The SI joints are corticated. There are degenerative changes noted in the lower lumbar spine.  IMPRESSION: 1. 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 No acute abnormality. 2. Degenerative change is noted in the lower  lumbar spine as well.   Electronically Signed   By: Ivar Drape M.D.   On: 09/19/2014 13:36   Dg Femur, Min 2 Views Right  10/08/2014   CLINICAL DATA:  Internal fixation of the right femur fracture.  EXAM: DG C-ARM 61-120 MIN; RIGHT FEMUR 2 VIEWS  COMPARISON:  10/08/2014  FINDINGS: Intramedullary nail was placed in the right femur with 2 distal interlocking screws. There is a dynamic hip screw extending through the femoral head and neck. Again noted is a fracture in the subtrochanteric region of the proximal femur.  IMPRESSION: Internal fixation of the proximal right femur fracture.   Electronically Signed   By: Markus Daft M.D.   On: 10/08/2014 16:27   Dg Femur, Min 2 Views Right  10/08/2014   CLINICAL DATA:  Proximal femur fracture.  EXAM: RIGHT FEMUR 2 VIEWS  COMPARISON:  10/08/2014 hip radiographs  FINDINGS: These images include femur below the level of the fracture. The sub trochanteric fracture itself is described on the hip radiographs.  Patellar spurring. Mid femoral periosteal thickening is not acute and may be a manifestation of remote trauma,melorheostosis, or simply adductor insertion site spurring. No acute distal femoral findings.  IMPRESSION: 1. Aside from the proximal femoral fracture, no additional acute findings.   Electronically Signed   By: Van Clines M.D.   On: 10/08/2014 14:12   Dg Femur Port, Min 2 Views Right  10/08/2014   CLINICAL DATA:  ORIF of right femur  EXAM: RIGHT FEMUR PORTABLE 4  VIEW  COMPARISON:  October 08, 2014 12:32 p.m.  FINDINGS: A compression screw and femoral rod are identified through proximal femoral shaft fracture with no gross malalignment. Postsurgical changes including soft tissue air are identified.  IMPRESSION: Right femoral fixation without gross malalignment.   Electronically Signed   By: Abelardo Diesel M.D.   On: 10/08/2014 18:11   Labs: Basic Metabolic Panel:  Recent Labs Lab 10/08/14 0859 10/09/14 0548 10/10/14 0615 10/11/14 0753  NA 136 139 137 137  K 3.6 3.9 3.3* 3.9  CL 99 101 100 99  CO2 27  30 29 28   GLUCOSE 142* 116* 119* 105*  BUN 21 15 17 18   CREATININE 1.23 1.13 1.06 1.11  CALCIUM 9.2 8.6 9.1 9.3   CBC:  Recent Labs Lab 10/08/14 0859 10/09/14 0548 10/10/14 0615 10/11/14 0753  WBC 11.5* 6.9 7.7 8.8  NEUTROABS 10.1*  --   --   --   HGB 15.8 13.6 13.8 14.3  HCT 46.1 40.4 41.5 42.8  MCV 91.5 91.6 93.9 93.9  PLT 192 164 161 183    Signed:  GHERGHE, COSTIN  [facility] 10/11/2014, 5:08 PM

## 2014-10-12 DIAGNOSIS — I10 Essential (primary) hypertension: Secondary | ICD-10-CM | POA: Diagnosis not present

## 2014-10-12 DIAGNOSIS — Z8546 Personal history of malignant neoplasm of prostate: Secondary | ICD-10-CM | POA: Diagnosis not present

## 2014-10-12 DIAGNOSIS — Z9181 History of falling: Secondary | ICD-10-CM | POA: Diagnosis not present

## 2014-10-12 DIAGNOSIS — S7221XD Displaced subtrochanteric fracture of right femur, subsequent encounter for closed fracture with routine healing: Secondary | ICD-10-CM | POA: Diagnosis not present

## 2014-10-16 DIAGNOSIS — I10 Essential (primary) hypertension: Secondary | ICD-10-CM | POA: Diagnosis not present

## 2014-10-16 DIAGNOSIS — Z9181 History of falling: Secondary | ICD-10-CM | POA: Diagnosis not present

## 2014-10-16 DIAGNOSIS — S7221XD Displaced subtrochanteric fracture of right femur, subsequent encounter for closed fracture with routine healing: Secondary | ICD-10-CM | POA: Diagnosis not present

## 2014-10-16 DIAGNOSIS — Z8546 Personal history of malignant neoplasm of prostate: Secondary | ICD-10-CM | POA: Diagnosis not present

## 2014-10-17 DIAGNOSIS — I10 Essential (primary) hypertension: Secondary | ICD-10-CM | POA: Diagnosis not present

## 2014-10-17 DIAGNOSIS — Z9181 History of falling: Secondary | ICD-10-CM | POA: Diagnosis not present

## 2014-10-17 DIAGNOSIS — Z8546 Personal history of malignant neoplasm of prostate: Secondary | ICD-10-CM | POA: Diagnosis not present

## 2014-10-17 DIAGNOSIS — S7221XD Displaced subtrochanteric fracture of right femur, subsequent encounter for closed fracture with routine healing: Secondary | ICD-10-CM | POA: Diagnosis not present

## 2014-10-18 DIAGNOSIS — S7221XD Displaced subtrochanteric fracture of right femur, subsequent encounter for closed fracture with routine healing: Secondary | ICD-10-CM | POA: Diagnosis not present

## 2014-10-18 DIAGNOSIS — Z9181 History of falling: Secondary | ICD-10-CM | POA: Diagnosis not present

## 2014-10-18 DIAGNOSIS — I10 Essential (primary) hypertension: Secondary | ICD-10-CM | POA: Diagnosis not present

## 2014-10-18 DIAGNOSIS — Z8546 Personal history of malignant neoplasm of prostate: Secondary | ICD-10-CM | POA: Diagnosis not present

## 2014-10-19 DIAGNOSIS — I10 Essential (primary) hypertension: Secondary | ICD-10-CM | POA: Diagnosis not present

## 2014-10-19 DIAGNOSIS — Z8546 Personal history of malignant neoplasm of prostate: Secondary | ICD-10-CM | POA: Diagnosis not present

## 2014-10-19 DIAGNOSIS — Z9181 History of falling: Secondary | ICD-10-CM | POA: Diagnosis not present

## 2014-10-19 DIAGNOSIS — S7221XD Displaced subtrochanteric fracture of right femur, subsequent encounter for closed fracture with routine healing: Secondary | ICD-10-CM | POA: Diagnosis not present

## 2014-10-22 ENCOUNTER — Encounter: Payer: Self-pay | Admitting: Internal Medicine

## 2014-10-22 DIAGNOSIS — Z9181 History of falling: Secondary | ICD-10-CM | POA: Diagnosis not present

## 2014-10-22 DIAGNOSIS — Z4789 Encounter for other orthopedic aftercare: Secondary | ICD-10-CM | POA: Diagnosis not present

## 2014-10-22 DIAGNOSIS — Z8546 Personal history of malignant neoplasm of prostate: Secondary | ICD-10-CM | POA: Diagnosis not present

## 2014-10-22 DIAGNOSIS — S72414D Nondisplaced unspecified condyle fracture of lower end of right femur, subsequent encounter for closed fracture with routine healing: Secondary | ICD-10-CM | POA: Diagnosis not present

## 2014-10-22 DIAGNOSIS — I10 Essential (primary) hypertension: Secondary | ICD-10-CM | POA: Diagnosis not present

## 2014-10-22 DIAGNOSIS — S7221XD Displaced subtrochanteric fracture of right femur, subsequent encounter for closed fracture with routine healing: Secondary | ICD-10-CM | POA: Diagnosis not present

## 2014-10-24 DIAGNOSIS — I10 Essential (primary) hypertension: Secondary | ICD-10-CM | POA: Diagnosis not present

## 2014-10-24 DIAGNOSIS — Z8546 Personal history of malignant neoplasm of prostate: Secondary | ICD-10-CM | POA: Diagnosis not present

## 2014-10-24 DIAGNOSIS — Z9181 History of falling: Secondary | ICD-10-CM | POA: Diagnosis not present

## 2014-10-24 DIAGNOSIS — S7221XD Displaced subtrochanteric fracture of right femur, subsequent encounter for closed fracture with routine healing: Secondary | ICD-10-CM | POA: Diagnosis not present

## 2014-10-25 DIAGNOSIS — I10 Essential (primary) hypertension: Secondary | ICD-10-CM | POA: Diagnosis not present

## 2014-10-25 DIAGNOSIS — Z9181 History of falling: Secondary | ICD-10-CM | POA: Diagnosis not present

## 2014-10-25 DIAGNOSIS — Z8546 Personal history of malignant neoplasm of prostate: Secondary | ICD-10-CM | POA: Diagnosis not present

## 2014-10-25 DIAGNOSIS — S7221XD Displaced subtrochanteric fracture of right femur, subsequent encounter for closed fracture with routine healing: Secondary | ICD-10-CM | POA: Diagnosis not present

## 2014-11-05 ENCOUNTER — Ambulatory Visit (INDEPENDENT_AMBULATORY_CARE_PROVIDER_SITE_OTHER): Payer: Medicare Other | Admitting: Family Medicine

## 2014-11-05 ENCOUNTER — Encounter: Payer: Self-pay | Admitting: Family Medicine

## 2014-11-05 VITALS — BP 140/64 | HR 68 | Temp 97.5°F | Ht 68.0 in | Wt 218.5 lb

## 2014-11-05 DIAGNOSIS — S7222XD Displaced subtrochanteric fracture of left femur, subsequent encounter for closed fracture with routine healing: Secondary | ICD-10-CM | POA: Diagnosis not present

## 2014-11-05 DIAGNOSIS — I10 Essential (primary) hypertension: Secondary | ICD-10-CM

## 2014-11-05 NOTE — Progress Notes (Signed)
Dr. Frederico Hamman T. Jode Lippe, MD, [facility] [place], [address] Phone: [phone] Fax: [phone]  11/05/2014  Patient: Cesar Powell, MRN: [id number], DOB: 1930-10-11, 81 y.o.  Primary Physician:  Owens Loffler, MD  Chief Complaint: Hospitalization Follow-up  Subjective:   Cesar Powell is a 81 y.o. very pleasant male patient who presents with the following:  New patient. Not seen in 5 years.  Was up in [place] Went to the gym up in [place].  On the last day, was on an oblique machine, torque Son in [place] father died, and got worse and worse.  Turned and put full weight on R leg. Then fx.   Saw Dr. Lyla Glassing Gradually increase weight and weight.  Then d/c and placed on Lovenox x 28 days  Admit date: 10/08/2014 Discharge date: 10/11/2014  Recommendations for Outpatient Follow-up:   Follow up with Dr. Lorelei Pont in 2 weeks  Follow up with Dr. Lyla Glassing in 2 weeks  Home health PT  Discharge Diagnoses:  Principal Problem:  Subtrochanteric fracture of femur  Procedures:  ORIF right femoral fx 3/8  Consultations:  Orthopedic surgery      Past Medical History, Surgical History, Social History, Family History, Problem List, Medications, and Allergies have been reviewed and updated if relevant.   GEN: No acute illnesses, no fevers, chills. GI: No n/v/d, eating normally Pulm: No SOB Interactive and getting along well at home.  Otherwise, ROS is as per the HPI.  Objective:   BP 140/64 mmHg  Pulse 68  Temp(Src) 97.5 F (36.4 C) (Oral)  Ht 5\' 8"  (1.727 m)  Wt 218 lb 8 oz (99.111 kg)  BMI 33.23 kg/m2  GEN: WDWN, NAD, Non-toxic, A & O x 3 HEENT: Atraumatic, Normocephalic. Neck supple. No masses, No LAD. Ears and Nose: No external deformity. CV: RRR, No M/G/R. No JVD. No thrill. No extra heart sounds. PULM: CTA B, no wheezes, crackles, rhonchi. No retractions. No resp. distress. No  accessory muscle use. EXTR: No c/c/tr edema NEURO using walker PSYCH: Normally interactive. Conversant. Not depressed or anxious appearing.  Calm demeanor.   Laboratory and Imaging Data: Results for orders placed or performed during the hospital encounter of 10/08/14  CBC with Differential  Result Value Ref Range   WBC 11.5 (H) 4.0 - 10.5 K/uL   RBC 5.04 4.22 - 5.81 MIL/uL   Hemoglobin 15.8 13.0 - 17.0 g/dL   HCT 46.1 39.0 - 52.0 %   MCV 91.5 78.0 - 100.0 fL   MCH 31.3 26.0 - 34.0 pg   MCHC 34.3 30.0 - 36.0 g/dL   RDW 14.3 11.5 - 15.5 %   Platelets 192 150 - 400 K/uL   Neutrophils Relative % 87 (H) 43 - 77 %   Neutro Abs 10.1 (H) 1.7 - 7.7 K/uL   Lymphocytes Relative 7 (L) 12 - 46 %   Lymphs Abs 0.8 0.7 - 4.0 K/uL   Monocytes Relative 6 3 - 12 %   Monocytes Absolute 0.6 0.1 - 1.0 K/uL   Eosinophils Relative 0 0 - 5 %   Eosinophils Absolute 0.0 0.0 - 0.7 K/uL   Basophils Relative 0 0 - 1 %   Basophils Absolute 0.0 0.0 - 0.1 K/uL  Basic metabolic panel  Result Value Ref Range   Sodium 136 135 - 145 mmol/L   Potassium 3.6 3.5 - 5.1 mmol/L   Chloride 99 96 - 112 mmol/L   CO2  27 19 - 32 mmol/L   Glucose, Bld 142 (H) 70 - 99 mg/dL   BUN 21 6 - 23 mg/dL   Creatinine, Ser 1.23 0.50 - 1.35 mg/dL   Calcium 9.2 8.4 - 10.5 mg/dL   GFR calc non Af Amer 53 (L) >90 mL/min   GFR calc Af Amer 61 (L) >90 mL/min   Anion gap 10 5 - 15  Protime-INR  Result Value Ref Range   Prothrombin Time 14.4 11.6 - 15.2 seconds   INR 1.10 0.00 - 1.49  CBC  Result Value Ref Range   WBC 6.9 4.0 - 10.5 K/uL   RBC 4.41 4.22 - 5.81 MIL/uL   Hemoglobin 13.6 13.0 - 17.0 g/dL   HCT 40.4 39.0 - 52.0 %   MCV 91.6 78.0 - 100.0 fL   MCH 30.8 26.0 - 34.0 pg   MCHC 33.7 30.0 - 36.0 g/dL   RDW 14.6 11.5 - 15.5 %   Platelets 164 150 - 400 K/uL  Basic metabolic panel  Result Value Ref Range   Sodium 139 135 - 145 mmol/L   Potassium 3.9 3.5 - 5.1 mmol/L   Chloride 101 96 - 112 mmol/L   CO2 30 19 - 32 mmol/L    Glucose, Bld 116 (H) 70 - 99 mg/dL   BUN 15 6 - 23 mg/dL   Creatinine, Ser 1.13 0.50 - 1.35 mg/dL   Calcium 8.6 8.4 - 10.5 mg/dL   GFR calc non Af Amer 59 (L) >90 mL/min   GFR calc Af Amer 68 (L) >90 mL/min   Anion gap 8 5 - 15  CBC  Result Value Ref Range   WBC 7.7 4.0 - 10.5 K/uL   RBC 4.42 4.22 - 5.81 MIL/uL   Hemoglobin 13.8 13.0 - 17.0 g/dL   HCT 41.5 39.0 - 52.0 %   MCV 93.9 78.0 - 100.0 fL   MCH 31.2 26.0 - 34.0 pg   MCHC 33.3 30.0 - 36.0 g/dL   RDW 14.5 11.5 - 15.5 %   Platelets 161 150 - 400 K/uL  Basic metabolic panel  Result Value Ref Range   Sodium 137 135 - 145 mmol/L   Potassium 3.3 (L) 3.5 - 5.1 mmol/L   Chloride 100 96 - 112 mmol/L   CO2 29 19 - 32 mmol/L   Glucose, Bld 119 (H) 70 - 99 mg/dL   BUN 17 6 - 23 mg/dL   Creatinine, Ser 1.06 0.50 - 1.35 mg/dL   Calcium 9.1 8.4 - 10.5 mg/dL   GFR calc non Af Amer 63 (L) >90 mL/min   GFR calc Af Amer 73 (L) >90 mL/min   Anion gap 8 5 - 15  CBC  Result Value Ref Range   WBC 8.8 4.0 - 10.5 K/uL   RBC 4.56 4.22 - 5.81 MIL/uL   Hemoglobin 14.3 13.0 - 17.0 g/dL   HCT 42.8 39.0 - 52.0 %   MCV 93.9 78.0 - 100.0 fL   MCH 31.4 26.0 - 34.0 pg   MCHC 33.4 30.0 - 36.0 g/dL   RDW 14.6 11.5 - 15.5 %   Platelets 183 150 - 400 K/uL  Basic metabolic panel  Result Value Ref Range   Sodium 137 135 - 145 mmol/L   Potassium 3.9 3.5 - 5.1 mmol/L   Chloride 99 96 - 112 mmol/L   CO2 28 19 - 32 mmol/L   Glucose, Bld 105 (H) 70 - 99 mg/dL   BUN 18 6 - 23 mg/dL  Creatinine, Ser 1.11 0.50 - 1.35 mg/dL   Calcium 9.3 8.4 - 10.5 mg/dL   GFR calc non Af Amer 60 (L) >90 mL/min   GFR calc Af Amer 69 (L) >90 mL/min   Anion gap 10 5 - 15  Type and screen  Result Value Ref Range   ABO/RH(D) A POS    Antibody Screen NEG    Sample Expiration 10/11/2014   ABO/Rh  Result Value Ref Range   ABO/RH(D) A POS      Assessment and Plan:   Closed left subtrochanteric femur fracture, with routine healing, subsequent  encounter  Essential hypertension  Doing very well given the circumstances  Follow-up: in fall for CPX  Signed,  Dafney Farler T. Revecca Nachtigal, MD   Patient's Medications  New Prescriptions   No medications on file  Previous Medications   AMLODIPINE (NORVASC) 5 MG TABLET    TAKE 1 TABLET (5 MG TOTAL) BY MOUTH DAILY.   ASCORBIC ACID (VITAMIN C) 1000 MG TABLET    Take 1,000 mg by mouth daily.   AXIRON 30 MG/ACT SOLN    Apply 1 application topically daily.   CHOLECALCIFEROL (VITAMIN D) 1000 UNITS TABLET    Take 1,000 Units by mouth daily.   ENOXAPARIN (LOVENOX) 40 MG/0.4ML INJECTION       GLUCOSAMINE-CHONDROITIN (GLUCOSAMINE CHONDR COMPLEX PO)    Take 2 capsules by mouth daily.   HYDROCHLOROTHIAZIDE (HYDRODIURIL) 25 MG TABLET    Take 1 tablet (25 mg total) by mouth daily.   HYDROCODONE-ACETAMINOPHEN (NORCO/VICODIN) 5-325 MG PER TABLET    Take 1-2 tablets by mouth every 4 (four) hours as needed for moderate pain.   MULTIPLE VITAMIN (MULTIVITAMIN) CAPSULE    Take 1 capsule by mouth daily.   OMEGA-3 FATTY ACIDS (FISH OIL) 1200 MG CAPS    Take 1,200 mg by mouth daily.   RIVAROXABAN (XARELTO) 10 MG TABS TABLET    Take 1 tablet (10 mg total) by mouth daily.  Modified Medications   No medications on file  Discontinued Medications   No medications on file

## 2014-11-05 NOTE — Progress Notes (Signed)
Pre visit review using our clinic review tool, if applicable. No additional management support is needed unless otherwise documented below in the visit note. 

## 2014-11-19 DIAGNOSIS — S72141D Displaced intertrochanteric fracture of right femur, subsequent encounter for closed fracture with routine healing: Secondary | ICD-10-CM | POA: Diagnosis not present

## 2014-12-04 DIAGNOSIS — M9903 Segmental and somatic dysfunction of lumbar region: Secondary | ICD-10-CM | POA: Diagnosis not present

## 2014-12-04 DIAGNOSIS — M9905 Segmental and somatic dysfunction of pelvic region: Secondary | ICD-10-CM | POA: Diagnosis not present

## 2014-12-04 DIAGNOSIS — M955 Acquired deformity of pelvis: Secondary | ICD-10-CM | POA: Diagnosis not present

## 2014-12-04 DIAGNOSIS — M5416 Radiculopathy, lumbar region: Secondary | ICD-10-CM | POA: Diagnosis not present

## 2014-12-07 DIAGNOSIS — E291 Testicular hypofunction: Secondary | ICD-10-CM | POA: Diagnosis not present

## 2014-12-07 DIAGNOSIS — R972 Elevated prostate specific antigen [PSA]: Secondary | ICD-10-CM | POA: Diagnosis not present

## 2014-12-18 DIAGNOSIS — M9905 Segmental and somatic dysfunction of pelvic region: Secondary | ICD-10-CM | POA: Diagnosis not present

## 2014-12-18 DIAGNOSIS — M955 Acquired deformity of pelvis: Secondary | ICD-10-CM | POA: Diagnosis not present

## 2014-12-18 DIAGNOSIS — M5416 Radiculopathy, lumbar region: Secondary | ICD-10-CM | POA: Diagnosis not present

## 2014-12-18 DIAGNOSIS — M9903 Segmental and somatic dysfunction of lumbar region: Secondary | ICD-10-CM | POA: Diagnosis not present

## 2015-01-01 DIAGNOSIS — Z4789 Encounter for other orthopedic aftercare: Secondary | ICD-10-CM | POA: Diagnosis not present

## 2015-01-02 DIAGNOSIS — M9905 Segmental and somatic dysfunction of pelvic region: Secondary | ICD-10-CM | POA: Diagnosis not present

## 2015-01-02 DIAGNOSIS — M955 Acquired deformity of pelvis: Secondary | ICD-10-CM | POA: Diagnosis not present

## 2015-01-02 DIAGNOSIS — M9903 Segmental and somatic dysfunction of lumbar region: Secondary | ICD-10-CM | POA: Diagnosis not present

## 2015-01-02 DIAGNOSIS — M5416 Radiculopathy, lumbar region: Secondary | ICD-10-CM | POA: Diagnosis not present

## 2015-01-10 DIAGNOSIS — C61 Malignant neoplasm of prostate: Secondary | ICD-10-CM | POA: Diagnosis not present

## 2015-01-10 DIAGNOSIS — E291 Testicular hypofunction: Secondary | ICD-10-CM | POA: Diagnosis not present

## 2015-01-10 DIAGNOSIS — R351 Nocturia: Secondary | ICD-10-CM | POA: Diagnosis not present

## 2015-01-15 DIAGNOSIS — M9903 Segmental and somatic dysfunction of lumbar region: Secondary | ICD-10-CM | POA: Diagnosis not present

## 2015-01-15 DIAGNOSIS — M5416 Radiculopathy, lumbar region: Secondary | ICD-10-CM | POA: Diagnosis not present

## 2015-01-15 DIAGNOSIS — M9905 Segmental and somatic dysfunction of pelvic region: Secondary | ICD-10-CM | POA: Diagnosis not present

## 2015-01-15 DIAGNOSIS — M955 Acquired deformity of pelvis: Secondary | ICD-10-CM | POA: Diagnosis not present

## 2015-02-07 ENCOUNTER — Ambulatory Visit: Payer: Medicare Other | Admitting: Internal Medicine

## 2015-02-19 DIAGNOSIS — M9905 Segmental and somatic dysfunction of pelvic region: Secondary | ICD-10-CM | POA: Diagnosis not present

## 2015-02-19 DIAGNOSIS — M955 Acquired deformity of pelvis: Secondary | ICD-10-CM | POA: Diagnosis not present

## 2015-02-19 DIAGNOSIS — M9903 Segmental and somatic dysfunction of lumbar region: Secondary | ICD-10-CM | POA: Diagnosis not present

## 2015-02-19 DIAGNOSIS — M5416 Radiculopathy, lumbar region: Secondary | ICD-10-CM | POA: Diagnosis not present

## 2015-03-19 DIAGNOSIS — M5416 Radiculopathy, lumbar region: Secondary | ICD-10-CM | POA: Diagnosis not present

## 2015-03-19 DIAGNOSIS — M955 Acquired deformity of pelvis: Secondary | ICD-10-CM | POA: Diagnosis not present

## 2015-03-19 DIAGNOSIS — M9905 Segmental and somatic dysfunction of pelvic region: Secondary | ICD-10-CM | POA: Diagnosis not present

## 2015-03-19 DIAGNOSIS — M9903 Segmental and somatic dysfunction of lumbar region: Secondary | ICD-10-CM | POA: Diagnosis not present

## 2015-04-03 DIAGNOSIS — S72141D Displaced intertrochanteric fracture of right femur, subsequent encounter for closed fracture with routine healing: Secondary | ICD-10-CM | POA: Diagnosis not present

## 2015-04-16 DIAGNOSIS — M9903 Segmental and somatic dysfunction of lumbar region: Secondary | ICD-10-CM | POA: Diagnosis not present

## 2015-04-16 DIAGNOSIS — M9905 Segmental and somatic dysfunction of pelvic region: Secondary | ICD-10-CM | POA: Diagnosis not present

## 2015-04-16 DIAGNOSIS — M955 Acquired deformity of pelvis: Secondary | ICD-10-CM | POA: Diagnosis not present

## 2015-04-16 DIAGNOSIS — M5416 Radiculopathy, lumbar region: Secondary | ICD-10-CM | POA: Diagnosis not present

## 2015-04-27 DIAGNOSIS — M9905 Segmental and somatic dysfunction of pelvic region: Secondary | ICD-10-CM | POA: Diagnosis not present

## 2015-04-27 DIAGNOSIS — M955 Acquired deformity of pelvis: Secondary | ICD-10-CM | POA: Diagnosis not present

## 2015-04-27 DIAGNOSIS — M5416 Radiculopathy, lumbar region: Secondary | ICD-10-CM | POA: Diagnosis not present

## 2015-04-27 DIAGNOSIS — M9903 Segmental and somatic dysfunction of lumbar region: Secondary | ICD-10-CM | POA: Diagnosis not present

## 2015-05-14 DIAGNOSIS — M9905 Segmental and somatic dysfunction of pelvic region: Secondary | ICD-10-CM | POA: Diagnosis not present

## 2015-05-14 DIAGNOSIS — M955 Acquired deformity of pelvis: Secondary | ICD-10-CM | POA: Diagnosis not present

## 2015-05-14 DIAGNOSIS — M5416 Radiculopathy, lumbar region: Secondary | ICD-10-CM | POA: Diagnosis not present

## 2015-05-14 DIAGNOSIS — M9903 Segmental and somatic dysfunction of lumbar region: Secondary | ICD-10-CM | POA: Diagnosis not present

## 2015-05-29 ENCOUNTER — Telehealth: Payer: Self-pay | Admitting: Cardiovascular Disease

## 2015-05-29 NOTE — Telephone Encounter (Signed)
Patient says he doesn't need to be seen right now.  Instructed to call office in future if needed.     Deleting recall as patient does not want to schedule

## 2015-06-11 DIAGNOSIS — M9905 Segmental and somatic dysfunction of pelvic region: Secondary | ICD-10-CM | POA: Diagnosis not present

## 2015-06-11 DIAGNOSIS — M5416 Radiculopathy, lumbar region: Secondary | ICD-10-CM | POA: Diagnosis not present

## 2015-06-11 DIAGNOSIS — M955 Acquired deformity of pelvis: Secondary | ICD-10-CM | POA: Diagnosis not present

## 2015-06-11 DIAGNOSIS — M9903 Segmental and somatic dysfunction of lumbar region: Secondary | ICD-10-CM | POA: Diagnosis not present

## 2015-07-01 DIAGNOSIS — C61 Malignant neoplasm of prostate: Secondary | ICD-10-CM | POA: Diagnosis not present

## 2015-07-01 DIAGNOSIS — E291 Testicular hypofunction: Secondary | ICD-10-CM | POA: Diagnosis not present

## 2015-07-09 DIAGNOSIS — M5416 Radiculopathy, lumbar region: Secondary | ICD-10-CM | POA: Diagnosis not present

## 2015-07-09 DIAGNOSIS — M955 Acquired deformity of pelvis: Secondary | ICD-10-CM | POA: Diagnosis not present

## 2015-07-09 DIAGNOSIS — M9905 Segmental and somatic dysfunction of pelvic region: Secondary | ICD-10-CM | POA: Diagnosis not present

## 2015-07-09 DIAGNOSIS — M9903 Segmental and somatic dysfunction of lumbar region: Secondary | ICD-10-CM | POA: Diagnosis not present

## 2015-07-11 DIAGNOSIS — R351 Nocturia: Secondary | ICD-10-CM | POA: Diagnosis not present

## 2015-07-11 DIAGNOSIS — E291 Testicular hypofunction: Secondary | ICD-10-CM | POA: Diagnosis not present

## 2015-07-11 DIAGNOSIS — C61 Malignant neoplasm of prostate: Secondary | ICD-10-CM | POA: Diagnosis not present

## 2015-08-11 DIAGNOSIS — C61 Malignant neoplasm of prostate: Secondary | ICD-10-CM | POA: Diagnosis not present

## 2015-08-13 DIAGNOSIS — M9905 Segmental and somatic dysfunction of pelvic region: Secondary | ICD-10-CM | POA: Diagnosis not present

## 2015-08-13 DIAGNOSIS — M5416 Radiculopathy, lumbar region: Secondary | ICD-10-CM | POA: Diagnosis not present

## 2015-08-13 DIAGNOSIS — M9903 Segmental and somatic dysfunction of lumbar region: Secondary | ICD-10-CM | POA: Diagnosis not present

## 2015-08-13 DIAGNOSIS — M955 Acquired deformity of pelvis: Secondary | ICD-10-CM | POA: Diagnosis not present

## 2015-09-10 DIAGNOSIS — M9903 Segmental and somatic dysfunction of lumbar region: Secondary | ICD-10-CM | POA: Diagnosis not present

## 2015-09-10 DIAGNOSIS — M955 Acquired deformity of pelvis: Secondary | ICD-10-CM | POA: Diagnosis not present

## 2015-09-10 DIAGNOSIS — M5416 Radiculopathy, lumbar region: Secondary | ICD-10-CM | POA: Diagnosis not present

## 2015-09-10 DIAGNOSIS — M9905 Segmental and somatic dysfunction of pelvic region: Secondary | ICD-10-CM | POA: Diagnosis not present

## 2015-09-11 DIAGNOSIS — C61 Malignant neoplasm of prostate: Secondary | ICD-10-CM | POA: Diagnosis not present

## 2015-10-02 DIAGNOSIS — S72141D Displaced intertrochanteric fracture of right femur, subsequent encounter for closed fracture with routine healing: Secondary | ICD-10-CM | POA: Diagnosis not present

## 2015-10-08 DIAGNOSIS — M9903 Segmental and somatic dysfunction of lumbar region: Secondary | ICD-10-CM | POA: Diagnosis not present

## 2015-10-08 DIAGNOSIS — M9905 Segmental and somatic dysfunction of pelvic region: Secondary | ICD-10-CM | POA: Diagnosis not present

## 2015-10-08 DIAGNOSIS — M955 Acquired deformity of pelvis: Secondary | ICD-10-CM | POA: Diagnosis not present

## 2015-10-08 DIAGNOSIS — M5416 Radiculopathy, lumbar region: Secondary | ICD-10-CM | POA: Diagnosis not present

## 2015-10-09 DIAGNOSIS — C61 Malignant neoplasm of prostate: Secondary | ICD-10-CM | POA: Diagnosis not present

## 2015-10-14 DIAGNOSIS — C61 Malignant neoplasm of prostate: Secondary | ICD-10-CM | POA: Diagnosis not present

## 2015-10-21 ENCOUNTER — Encounter: Payer: Self-pay | Admitting: Family Medicine

## 2015-10-21 ENCOUNTER — Ambulatory Visit (INDEPENDENT_AMBULATORY_CARE_PROVIDER_SITE_OTHER): Payer: Medicare Other | Admitting: Family Medicine

## 2015-10-21 VITALS — BP 160/58 | HR 62 | Temp 98.6°F | Ht 67.0 in | Wt 228.8 lb

## 2015-10-21 DIAGNOSIS — Z79899 Other long term (current) drug therapy: Secondary | ICD-10-CM | POA: Diagnosis not present

## 2015-10-21 DIAGNOSIS — Z0000 Encounter for general adult medical examination without abnormal findings: Secondary | ICD-10-CM | POA: Diagnosis not present

## 2015-10-21 DIAGNOSIS — I10 Essential (primary) hypertension: Secondary | ICD-10-CM

## 2015-10-21 DIAGNOSIS — E8881 Metabolic syndrome: Secondary | ICD-10-CM

## 2015-10-21 DIAGNOSIS — C61 Malignant neoplasm of prostate: Secondary | ICD-10-CM

## 2015-10-21 DIAGNOSIS — E785 Hyperlipidemia, unspecified: Secondary | ICD-10-CM | POA: Diagnosis not present

## 2015-10-21 DIAGNOSIS — Z8546 Personal history of malignant neoplasm of prostate: Secondary | ICD-10-CM | POA: Diagnosis not present

## 2015-10-21 LAB — CBC WITH DIFFERENTIAL/PLATELET
Basophils Absolute: 0 10*3/uL (ref 0.0–0.1)
Basophils Relative: 0.5 % (ref 0.0–3.0)
EOS ABS: 0.3 10*3/uL (ref 0.0–0.7)
Eosinophils Relative: 5.1 % — ABNORMAL HIGH (ref 0.0–5.0)
HCT: 39.2 % (ref 39.0–52.0)
HEMOGLOBIN: 13.4 g/dL (ref 13.0–17.0)
Lymphocytes Relative: 16 % (ref 12.0–46.0)
Lymphs Abs: 1 10*3/uL (ref 0.7–4.0)
MCHC: 34.2 g/dL (ref 30.0–36.0)
MCV: 95.4 fl (ref 78.0–100.0)
MONO ABS: 0.5 10*3/uL (ref 0.1–1.0)
Monocytes Relative: 7.3 % (ref 3.0–12.0)
NEUTROS PCT: 71.1 % (ref 43.0–77.0)
Neutro Abs: 4.6 10*3/uL (ref 1.4–7.7)
Platelets: 226 10*3/uL (ref 150.0–400.0)
RBC: 4.11 Mil/uL — ABNORMAL LOW (ref 4.22–5.81)
RDW: 14.3 % (ref 11.5–15.5)
WBC: 6.5 10*3/uL (ref 4.0–10.5)

## 2015-10-21 LAB — LIPID PANEL
CHOL/HDL RATIO: 3
Cholesterol: 240 mg/dL — ABNORMAL HIGH (ref 0–200)
HDL: 71.9 mg/dL (ref >39.00)
LDL CALC: 149 mg/dL — AB (ref 0–99)
NonHDL: 168.56
Triglycerides: 98 mg/dL (ref 0.0–149.0)
VLDL: 19.6 mg/dL (ref 0.0–40.0)

## 2015-10-21 LAB — HEPATIC FUNCTION PANEL
ALT: 22 U/L (ref 0–53)
AST: 21 U/L (ref 0–37)
Albumin: 4.7 g/dL (ref 3.5–5.2)
Alkaline Phosphatase: 48 U/L (ref 39–117)
BILIRUBIN TOTAL: 0.7 mg/dL (ref 0.2–1.2)
Bilirubin, Direct: 0.1 mg/dL (ref 0.0–0.3)
Total Protein: 7.8 g/dL (ref 6.0–8.3)

## 2015-10-21 LAB — BASIC METABOLIC PANEL
BUN: 24 mg/dL — ABNORMAL HIGH (ref 6–23)
CALCIUM: 9.8 mg/dL (ref 8.4–10.5)
CHLORIDE: 100 meq/L (ref 96–112)
CO2: 30 mEq/L (ref 19–32)
CREATININE: 1.28 mg/dL (ref 0.40–1.50)
GFR: 57.03 mL/min — ABNORMAL LOW (ref >60.00)
Glucose, Bld: 115 mg/dL — ABNORMAL HIGH (ref 70–99)
Potassium: 4.5 mEq/L (ref 3.5–5.1)
Sodium: 137 mEq/L (ref 135–145)

## 2015-10-21 NOTE — Progress Notes (Signed)
Pre visit review using our clinic review tool, if applicable. No additional management support is needed unless otherwise documented below in the visit note. 

## 2015-10-21 NOTE — Progress Notes (Signed)
Dr. Frederico Hamman T. Deny Chevez, MD, [facility] [place], [address] Phone: [phone] Fax: [phone]  10/21/2015  Patient: Cesar Powell, MRN: [id number], DOB: 09/11/1930, 82 y.o.  Primary Physician:  Owens Loffler, MD   Chief Complaint  Patient presents with  . Annual Exam    Medicare Wellness   Subjective:   Cesar Powell is a 82 y.o. pleasant patient who presents for a medicare wellness examination:  Preventative Health Maintenance Visit:  Health Maintenance Summary Reviewed and updated, unless pt declines services.  Tobacco History Reviewed. Alcohol: No concerns, no excessive use Exercise Habits: working out 3-4 days a week STD concerns: no risk or activity to increase risk Drug Use: None Encouraged self-testicular check  HTN:  Stopped all of his BP meds Home range 145-165/70-80's No CP, no sob. No HA.  BP Readings from Last 3 Encounters:  10/21/15 160/58  11/05/14 140/64  10/11/14 546/56    Basic Metabolic Panel:    Component Value Date/Time   NA 137 10/21/2015 0939   NA 137 03/08/2012 1641   K 4.5 10/21/2015 0939   K 5.8* 03/08/2012 1641   CL 100 10/21/2015 0939   CL 103 03/08/2012 1641   CO2 30 10/21/2015 0939   CO2 25 03/08/2012 1641   BUN 24* 10/21/2015 0939   BUN 13 03/08/2012 1641   CREATININE 1.28 10/21/2015 0939   CREATININE 0.84 03/08/2012 1641   GLUCOSE 115* 10/21/2015 0939   GLUCOSE 97 03/08/2012 1641   CALCIUM 9.8 10/21/2015 0939   CALCIUM 9.0 03/08/2012 1641      Working out three dys a week.  R sub troch femur fracture.  1 year, still recovering.   PSA - 2005, s/p rads.  0.03 for a number of years.  Then last year 3.5 and then 18. Now on hormone therapy.  Stopped 2 BP meds.   158/62.  PSA: send to MD in [place]. Dr. Ronald Pippins [phone] (ph)   Health Maintenance  Topic Date Due  . INFLUENZA VACCINE  11/01/2015 (Originally 03/04/2015)  . PNA vac Low  Risk Adult (2 of 2 - PCV13) 10/20/2016 (Originally 09/05/2010)  . TETANUS/TDAP  09/06/2019  . ZOSTAVAX  Completed    Immunization History  Administered Date(s) Administered  . Influenza Whole 09/05/2009  . Pneumococcal Polysaccharide-23 09/05/2009  . Td 09/05/2009  . Zoster 09/05/2009    Patient Active Problem List   Diagnosis Date Noted  . Subtrochanteric fracture of femur ([place]) 10/08/2014  . Essential hypertension 09/06/2009  . ERECTILE DYSFUNCTION, MILD 07/25/2007  . METABOLIC SYNDROME X 74/94/4967  . Obesity 06/22/2007  . DIVERTICULOSIS, COLON 06/22/2007  . Obstructive sleep apnea 06/22/2007  . Local recurrence of prostate cancer ([place]) 06/22/2007  . DIVERTICULITIS, HX OF 06/22/2007   Past Medical History  Diagnosis Date  . Hypertension   . H/O prostate cancer     was treated with radiation  . Shingles   . Local recurrence of prostate cancer ([place]) 06/22/2007    Qualifier: Diagnosis of  By: Fuller Plan CMA (AAMA), Terri Skains     Past Surgical History  Procedure Laterality Date  . Abdominal surgery      ruptured intestines   . Femur im nail Right 10/08/2014    Procedure: INTRAMEDULLARY (IM) NAIL FEMORAL;  Surgeon: Rod Can, MD;  Location: [facility];  Service: Orthopedics;  Laterality: Right;  PER DANIELLE 110 MIN   Social History   Social History  . Marital Status: Married  Spouse Name: N/A  . Number of Children: N/A  . Years of Education: N/A   Occupational History  . retired    Social History Main Topics  . Smoking status: Former Smoker -- 1.00 packs/day for 15 years    Types: Cigarettes    Quit date: 09/14/1965  . Smokeless tobacco: Never Used  . Alcohol Use: 3.5 oz/week    7 drink(s) per week     Comment: daily  . Drug Use: No  . Sexual Activity: Not on file   Other Topics Concern  . Not on file   Social History Narrative   Family History  Problem Relation Age of Onset  . Heart disease Brother   . Heart attack Brother    No Known  Allergies  Medication list has been reviewed and updated.   General: Denies fever, chills, sweats. No significant weight loss. Eyes: Denies blurring,significant itching ENT: Denies earache, sore throat, and hoarseness. Cardiovascular: Denies chest pains, palpitations, dyspnea on exertion Respiratory: Denies cough, dyspnea at rest,wheeezing Breast: no concerns about lumps GI: Denies nausea, vomiting, diarrhea, constipation, change in bowel habits, abdominal pain, melena, hematochezia GU: RECURRENCE OF HIS PROSTATE CANCER AS ABOVE Musculoskeletal: Denies back pain, joint pain Derm: Denies rash, itching Neuro: Denies  paresthesias, frequent falls, frequent headaches Psych: Denies depression, anxiety Endocrine: Denies cold intolerance, heat intolerance, polydipsia Heme: Denies enlarged lymph nodes Allergy: No hayfever  Objective:   BP 160/58 mmHg  Pulse 62  Temp(Src) 98.6 F (37 C) (Oral)  Ht _0  (1.702 m)  Wt 228 lb 12 oz (103.76 kg)  BMI 35.82 kg/m2  The patient completed a fall screen and PHQ-2 and PHQ-9 if necessary, which is documented in the EHR. The CMA/LPN/RN who assisted the patient verbally completed with them and documented results in [facility].  Hearing Screening Comments: Wears Bilateral Hearing Aides Vision Screening Comments: Wears Glasses-Has eye exam scheduled 11/01/2015 at [facility]: well developed, well nourished, no acute distress Eyes: conjunctiva and lids normal, PERRLA, EOMI ENT: TM clear, nares clear, oral exam WNL Neck: supple, no lymphadenopathy, no thyromegaly, no JVD Pulm: clear to auscultation and percussion, respiratory effort normal CV: regular rate and rhythm, S1-S2, no murmur, rub or gallop, no bruits, peripheral pulses normal and symmetric, no cyanosis, clubbing, edema or varicosities GI: soft, non-tender; no hepatosplenomegaly, masses; active bowel sounds all quadrants GU: no hernia, testicular mass, penile  discharge Lymph: no cervical, axillary or inguinal adenopathy MSK: gait normal, muscle tone and strength WNL, no joint swelling, effusions, discoloration, crepitus  SKIN: clear, good turgor, color WNL, no rashes, lesions, or ulcerations Neuro: normal mental status, normal strength, sensation, and motion Psych: alert; oriented to person, place and time, normally interactive and not anxious or depressed in appearance.  All labs reviewed with patient.  Lipids:    Component Value Date/Time   CHOL 240* 10/21/2015 0939   TRIG 98.0 10/21/2015 0939   HDL 71.90 10/21/2015 0939   LDLDIRECT 148.9 12/07/2007 1217   VLDL 19.6 10/21/2015 0939   CHOLHDL 3 10/21/2015 0939   CBC: CBC Latest Ref Rng 10/21/2015 10/11/2014 10/10/2014  WBC 4.0 - 10.5 K/uL 6.5 8.8 7.7  Hemoglobin 13.0 - 17.0 g/dL 13.4 14.3 13.8  Hematocrit 39.0 - 52.0 % 39.2 42.8 41.5  Platelets 150.0 - 400.0 K/uL 226.0 183 324    Basic Metabolic Panel:    Component Value Date/Time   NA 137 10/21/2015 0939   NA 137 03/08/2012 1641   K 4.5  10/21/2015 0939   K 5.8* 03/08/2012 1641   CL 100 10/21/2015 0939   CL 103 03/08/2012 1641   CO2 30 10/21/2015 0939   CO2 25 03/08/2012 1641   BUN 24* 10/21/2015 0939   BUN 13 03/08/2012 1641   CREATININE 1.28 10/21/2015 0939   CREATININE 0.84 03/08/2012 1641   GLUCOSE 115* 10/21/2015 0939   GLUCOSE 97 03/08/2012 1641   CALCIUM 9.8 10/21/2015 0939   CALCIUM 9.0 03/08/2012 1641   Hepatic Function Latest Ref Rng 10/21/2015 03/08/2012 08/26/2009  Total Protein 6.0 - 8.3 g/dL 7.8 8.1 7.4  Albumin 3.5 - 5.2 g/dL 4.7 4.2 4.1  AST 0 - 37 U/L 21 62(H) 23  ALT 0 - 53 U/L 22 36 27  Alk Phosphatase 39 - 117 U/L 48 51 47  Total Bilirubin 0.2 - 1.2 mg/dL 0.7 0.5 0.9  Bilirubin, Direct 0.0 - 0.3 mg/dL 0.1 - 0.1    Lab Results  Component Value Date   TSH 4.69 08/26/2009   Lab Results  Component Value Date   PSA 0.32 08/26/2009   PSA 0.32 12/07/2007   PSA 0 04/03/2006    Assessment and Plan:    Healthcare maintenance  PROSTATE CANCER, HX OF - Plan: PSA, total and free  Essential hypertension  METABOLIC SYNDROME X - Plan: Lipid panel  Encounter for long-term (current) use of medications - Plan: Basic metabolic panel, CBC with Differential/Platelet, Hepatic function panel  Hyperlipidemia LDL goal <100 - Plan: Lipid panel  Local recurrence of prostate cancer ([place])  >15 minutes spent in face to face time with patient, >50% spent in counselling or coordination of care: Additional time spent in discussion regarding the patient's recurrent prostate cancer, chemical castration, not tried answer his questions. He currently has a urologist in the state of [place]. I recommended back and set him up with someone here locally fairly easily.  Hypertension, additional time spent in discussion and he does not want to go on medication. He recognizes that his blood pressure is currently in the 160's, and he is willing to accept that risk and understands that that risk includes stroke, myocardial infarction and death. Trial of weight loss alone - which I discussed would be challenging in a state where he has been medically induced to have a very low testosterone.  Health Maintenance Exam: The patient's preventative maintenance and recommended screening tests for an annual wellness exam were reviewed in full today. Brought up to date unless services declined.  Counselled on the importance of diet, exercise, and its role in overall health and mortality. The patient's FH and SH was reviewed, including their home life, tobacco status, and drug and alcohol status.  I have personally reviewed the Medicare Annual Wellness questionnaire and have noted 1. The patient's medical and social history 2. Their use of alcohol, tobacco or illicit drugs 3. Their current medications and supplements 4. The patient's functional ability including ADL's, fall risks, home safety risks and hearing or visual              impairment. 5. Diet and physical activities 6. Evidence for depression or mood disorders 7. Reviewed Updated provider list, see scanned forms and [facility] Snapshot.   The patients weight, height, BMI and visual acuity have been recorded in the chart I have made referrals, counseling and provided education to the patient based review of the above and I have provided the pt with a written personalized care plan for preventive services.  I have provided the patient  with a copy of your personalized plan for preventive services. Instructed to take the time to review along with their updated medication list.  Follow-up: No Follow-up on file. Or follow-up in 1 year for complete physical examination  Orders Placed This Encounter  Procedures  . PSA, total and free  . Lipid panel  . Basic metabolic panel  . CBC with Differential/Platelet  . Hepatic function panel    Signed,  Frederico Hamman T. Taffie Eckmann, MD   Patient's Medications  New Prescriptions   No medications on file  Previous Medications   ASCORBIC ACID (VITAMIN C) 1000 MG TABLET    Take 1,000 mg by mouth daily.   CHOLECALCIFEROL (VITAMIN D) 1000 UNITS TABLET    Take 1,000 Units by mouth daily.   GLUCOSAMINE-CHONDROITIN (GLUCOSAMINE CHONDR COMPLEX PO)    Take 2 capsules by mouth daily.   MULTIPLE VITAMIN (MULTIVITAMIN) CAPSULE    Take 1 capsule by mouth daily.   OMEGA-3 FATTY ACIDS (FISH OIL) 1200 MG CAPS    Take 1,200 mg by mouth daily.  Modified Medications   No medications on file  Discontinued Medications   AMLODIPINE (NORVASC) 5 MG TABLET    TAKE 1 TABLET (5 MG TOTAL) BY MOUTH DAILY.   AXIRON 30 MG/ACT SOLN    Apply 1 application topically daily.   ENOXAPARIN (LOVENOX) 40 MG/0.4ML INJECTION       HYDROCHLOROTHIAZIDE (HYDRODIURIL) 25 MG TABLET    Take 1 tablet (25 mg total) by mouth daily.   HYDROCODONE-ACETAMINOPHEN (NORCO/VICODIN) 5-325 MG PER TABLET    Take 1-2 tablets by mouth every 4 (four) hours as needed for moderate pain.    RIVAROXABAN (XARELTO) 10 MG TABS TABLET    Take 1 tablet (10 mg total) by mouth daily.

## 2015-10-21 NOTE — Addendum Note (Signed)
Addended by: Marchia Bond on: 10/21/2015 03:40 PM   Modules accepted: Miquel Dunn

## 2015-10-22 LAB — PSA, TOTAL AND FREE
PSA FREE PCT: 13 % — AB (ref >25)
PSA, Free: <0.1 ng/mL
PSA: 0.47 ng/mL (ref <=4.00)

## 2015-11-01 DIAGNOSIS — H35371 Puckering of macula, right eye: Secondary | ICD-10-CM | POA: Diagnosis not present

## 2015-11-01 DIAGNOSIS — H2513 Age-related nuclear cataract, bilateral: Secondary | ICD-10-CM | POA: Diagnosis not present

## 2015-11-05 DIAGNOSIS — M955 Acquired deformity of pelvis: Secondary | ICD-10-CM | POA: Diagnosis not present

## 2015-11-05 DIAGNOSIS — M9903 Segmental and somatic dysfunction of lumbar region: Secondary | ICD-10-CM | POA: Diagnosis not present

## 2015-11-05 DIAGNOSIS — M5416 Radiculopathy, lumbar region: Secondary | ICD-10-CM | POA: Diagnosis not present

## 2015-11-05 DIAGNOSIS — M9905 Segmental and somatic dysfunction of pelvic region: Secondary | ICD-10-CM | POA: Diagnosis not present

## 2015-11-09 DIAGNOSIS — C61 Malignant neoplasm of prostate: Secondary | ICD-10-CM | POA: Diagnosis not present

## 2015-11-12 DIAGNOSIS — H2513 Age-related nuclear cataract, bilateral: Secondary | ICD-10-CM | POA: Diagnosis not present

## 2015-12-03 DIAGNOSIS — M9903 Segmental and somatic dysfunction of lumbar region: Secondary | ICD-10-CM | POA: Diagnosis not present

## 2015-12-03 DIAGNOSIS — M5416 Radiculopathy, lumbar region: Secondary | ICD-10-CM | POA: Diagnosis not present

## 2015-12-03 DIAGNOSIS — M955 Acquired deformity of pelvis: Secondary | ICD-10-CM | POA: Diagnosis not present

## 2015-12-03 DIAGNOSIS — M9905 Segmental and somatic dysfunction of pelvic region: Secondary | ICD-10-CM | POA: Diagnosis not present

## 2015-12-09 DIAGNOSIS — C61 Malignant neoplasm of prostate: Secondary | ICD-10-CM | POA: Diagnosis not present

## 2016-01-02 DIAGNOSIS — C61 Malignant neoplasm of prostate: Secondary | ICD-10-CM | POA: Diagnosis not present

## 2016-01-07 DIAGNOSIS — M955 Acquired deformity of pelvis: Secondary | ICD-10-CM | POA: Diagnosis not present

## 2016-01-07 DIAGNOSIS — M5416 Radiculopathy, lumbar region: Secondary | ICD-10-CM | POA: Diagnosis not present

## 2016-01-07 DIAGNOSIS — M9903 Segmental and somatic dysfunction of lumbar region: Secondary | ICD-10-CM | POA: Diagnosis not present

## 2016-01-07 DIAGNOSIS — M9905 Segmental and somatic dysfunction of pelvic region: Secondary | ICD-10-CM | POA: Diagnosis not present

## 2016-01-14 DIAGNOSIS — E291 Testicular hypofunction: Secondary | ICD-10-CM | POA: Diagnosis not present

## 2016-01-14 DIAGNOSIS — R351 Nocturia: Secondary | ICD-10-CM | POA: Diagnosis not present

## 2016-01-14 DIAGNOSIS — C61 Malignant neoplasm of prostate: Secondary | ICD-10-CM | POA: Diagnosis not present

## 2016-01-22 DIAGNOSIS — H2513 Age-related nuclear cataract, bilateral: Secondary | ICD-10-CM | POA: Diagnosis not present

## 2016-01-27 ENCOUNTER — Encounter: Payer: Self-pay | Admitting: *Deleted

## 2016-01-27 NOTE — Discharge Instructions (Signed)

## 2016-01-29 ENCOUNTER — Ambulatory Visit
Admission: RE | Admit: 2016-01-29 | Discharge: 2016-01-29 | Disposition: A | Discharge status: Home / Self Care | Payer: Medicare Other | Source: Ambulatory Visit | Attending: Ophthalmology | Admitting: Ophthalmology

## 2016-01-29 ENCOUNTER — Encounter
Admission: RE | Disposition: A | Discharge status: Home / Self Care | Payer: Self-pay | Source: Ambulatory Visit | Attending: Ophthalmology

## 2016-01-29 ENCOUNTER — Ambulatory Visit: Payer: Medicare Other | Admitting: Anesthesiology

## 2016-01-29 DIAGNOSIS — H2513 Age-related nuclear cataract, bilateral: Secondary | ICD-10-CM | POA: Diagnosis not present

## 2016-01-29 DIAGNOSIS — H2512 Age-related nuclear cataract, left eye: Secondary | ICD-10-CM | POA: Insufficient documentation

## 2016-01-29 DIAGNOSIS — I10 Essential (primary) hypertension: Secondary | ICD-10-CM | POA: Diagnosis not present

## 2016-01-29 DIAGNOSIS — Z87891 Personal history of nicotine dependence: Secondary | ICD-10-CM | POA: Diagnosis not present

## 2016-01-29 DIAGNOSIS — G4730 Sleep apnea, unspecified: Secondary | ICD-10-CM | POA: Diagnosis not present

## 2016-01-29 HISTORY — PX: CATARACT EXTRACTION W/PHACO: SHX586

## 2016-01-29 SURGERY — PHACOEMULSIFICATION, CATARACT, WITH IOL INSERTION
Anesthesia: Monitor Anesthesia Care | Site: Eye | Laterality: Left | Wound class: Clean

## 2016-01-29 MED ORDER — EPINEPHRINE HCL 1 MG/ML IJ SOLN
INTRAOCULAR | Status: DC | PRN
  Administered 2016-01-29: 49 mL via OPHTHALMIC
  Administered 2016-01-29: 08:00:00 via OPHTHALMIC

## 2016-01-29 MED ORDER — LIDOCAINE HCL (PF) 4 % IJ SOLN
INTRAOCULAR | Status: DC | PRN
  Administered 2016-01-29: 1 mL via OPHTHALMIC

## 2016-01-29 MED ORDER — FENTANYL CITRATE (PF) 100 MCG/2ML IJ SOLN
INTRAMUSCULAR | Status: DC | PRN
  Administered 2016-01-29: 50 ug via INTRAVENOUS

## 2016-01-29 MED ORDER — CEFUROXIME OPHTHALMIC INJECTION 1 MG/0.1 ML
INJECTION | OPHTHALMIC | Status: DC | PRN
  Administered 2016-01-29: 0.1 mL via INTRACAMERAL

## 2016-01-29 MED ORDER — LACTATED RINGERS IV SOLN: INTRAVENOUS | Status: DC

## 2016-01-29 MED ORDER — ERYTHROMYCIN 5 MG/GM OP OINT
TOPICAL_OINTMENT | OPHTHALMIC | Status: DC | PRN
  Administered 2016-01-29: 1 via OPHTHALMIC

## 2016-01-29 MED ORDER — NA HYALUR & NA CHOND-NA HYALUR 0.4-0.35 ML IO KIT
PACK | INTRAOCULAR | Status: DC | PRN
  Administered 2016-01-29: 1 mL via INTRAOCULAR

## 2016-01-29 MED ORDER — ACETAMINOPHEN 325 MG PO TABS: 325.0000 mg | ORAL_TABLET | ORAL | Status: DC | PRN

## 2016-01-29 MED ORDER — TETRACAINE HCL 0.5 % OP SOLN
1.0000 [drp] | OPHTHALMIC | Status: DC | PRN
  Administered 2016-01-29: 1 [drp] via OPHTHALMIC

## 2016-01-29 MED ORDER — TIMOLOL MALEATE 0.5 % OP SOLN
OPHTHALMIC | Status: DC | PRN
  Administered 2016-01-29: 1 [drp] via OPHTHALMIC

## 2016-01-29 MED ORDER — POVIDONE-IODINE 5 % OP SOLN
1.0000 "application " | OPHTHALMIC | Status: DC | PRN
  Administered 2016-01-29: 1 via OPHTHALMIC

## 2016-01-29 MED ORDER — MIDAZOLAM HCL 2 MG/2ML IJ SOLN
INTRAMUSCULAR | Status: DC | PRN
  Administered 2016-01-29: 2 mg via INTRAVENOUS

## 2016-01-29 MED ORDER — BRIMONIDINE TARTRATE 0.2 % OP SOLN
OPHTHALMIC | Status: DC | PRN
  Administered 2016-01-29: 1 [drp] via OPHTHALMIC

## 2016-01-29 MED ORDER — ACETAMINOPHEN 160 MG/5ML PO SOLN: 325.0000 mg | ORAL | Status: DC | PRN

## 2016-01-29 MED ORDER — ARMC OPHTHALMIC DILATING GEL
1.0000 "application " | OPHTHALMIC | Status: DC | PRN
  Administered 2016-01-29 (×2): 1 via OPHTHALMIC

## 2016-01-29 SURGICAL SUPPLY — 26 items
CANNULA ANT/CHMB 27GA (MISCELLANEOUS) ×3 IMPLANT
CARTRIDGE ABBOTT (MISCELLANEOUS) IMPLANT
GLOVE SURG LX 7.5 STRW (GLOVE) ×2
GLOVE SURG LX STRL 7.5 STRW (GLOVE) ×1 IMPLANT
GLOVE SURG TRIUMPH 8.0 PF LTX (GLOVE) ×3 IMPLANT
GOWN STRL REUS W/ TWL LRG LVL3 (GOWN DISPOSABLE) ×2 IMPLANT
GOWN STRL REUS W/TWL LRG LVL3 (GOWN DISPOSABLE) ×4
LENS IOL RESTOR  20.5 (Intraocular Lens) ×2 IMPLANT
LENS IOL RESTOR 20.5 (Intraocular Lens) ×1 IMPLANT
MARKER SKIN DUAL TIP RULER LAB (MISCELLANEOUS) ×3 IMPLANT
NDL RETROBULBAR .5 NSTRL (NEEDLE) IMPLANT
NEEDLE FILTER BLUNT 18X 1/2SAF (NEEDLE) ×2
NEEDLE FILTER BLUNT 18X1 1/2 (NEEDLE) ×1 IMPLANT
PACK CATARACT BRASINGTON (MISCELLANEOUS) ×3 IMPLANT
PACK EYE AFTER SURG (MISCELLANEOUS) ×3 IMPLANT
PACK OPTHALMIC (MISCELLANEOUS) ×3 IMPLANT
RING MALYGIN 7.0 (MISCELLANEOUS) IMPLANT
SUT ETHILON 10-0 CS-B-6CS-B-6 (SUTURE)
SUT VICRYL  9 0 (SUTURE)
SUT VICRYL 9 0 (SUTURE) IMPLANT
SUTURE EHLN 10-0 CS-B-6CS-B-6 (SUTURE) IMPLANT
SYR 3ML LL SCALE MARK (SYRINGE) ×3 IMPLANT
SYR 5ML LL (SYRINGE) ×3 IMPLANT
SYR TB 1ML LUER SLIP (SYRINGE) ×3 IMPLANT
WATER STERILE IRR 250ML POUR (IV SOLUTION) ×3 IMPLANT
WIPE NON LINTING 3.25X3.25 (MISCELLANEOUS) ×3 IMPLANT

## 2016-01-29 NOTE — H&P (Signed)
  The History and Physical notes are on paper, have been signed, and are to be scanned. The patient remains stable and unchanged from the H&P.   Previous H&P reviewed, patient examined, and there are no changes.  Cesar Powell 01/29/2016 8:06 AM

## 2016-01-29 NOTE — Anesthesia Procedure Notes (Signed)
Procedure Name: MAC Performed by: Maxum Cassarino Pre-anesthesia Checklist: Patient identified, Emergency Drugs available, Suction available, Timeout performed and Patient being monitored Patient Re-evaluated:Patient Re-evaluated prior to inductionOxygen Delivery Method: Nasal cannula Placement Confirmation: positive ETCO2     

## 2016-01-29 NOTE — Op Note (Signed)
LOCATION:  [facility]   PREOPERATIVE DIAGNOSIS:  Nuclear sclerotic cataract of the left eye.  H25.12  POSTOPERATIVE DIAGNOSIS:  Nuclear sclerotic cataract of the left eye.   PROCEDURE:  Phacoemulsification with Toric posterior chamber intraocular lens placement of the left eye.   LENS:  Implant Name Type Inv. Item Serial No. Manufacturer Lot No. LRB No. Used  LENS IOL RESTORE  20.5 - [id number] Intraocular Lens LENS IOL RESTORE  20.5 [id number] ALCON   Left 1   [id number] ReSTOR Toric intraocular lens with 1.5 diopters of cylindrical power with axis orientation at 67 degrees.   ULTRASOUND TIME: 23 % of 0 minutes, 58 seconds.  CDE 13.0   SURGEON:  Wyonia Hough, MD   ANESTHESIA:  Topical with tetracaine drops and 2% Xylocaine jelly, augmented with 1% preservative-free intracameral lidocaine.  COMPLICATIONS:  None.   DESCRIPTION OF PROCEDURE:  The patient was identified in the holding room and transported to the operating suite and placed in the supine position under the operating microscope.  The left eye was identified as the operative eye, and it was prepped and draped in the usual sterile ophthalmic fashion.    A clear-corneal paracentesis incision was made at the 1:30 position.  0.5 ml of preservative-free 1% lidocaine was injected into the anterior chamber. The anterior chamber was filled with Viscoat.  A 2.4 millimeter near clear corneal incision was then made at the 10:30 position.  A cystotome and capsulorrhexis forceps were then used to make a curvilinear capsulorrhexis.  Hydrodissection and hydrodelineation were then performed using balanced salt solution.   Phacoemulsification was then used in stop and chop fashion to remove the lens, nucleus and epinucleus.  The remaining cortex was aspirated using the irrigation and aspiration handpiece.  Provisc viscoelastic was then placed into the capsular bag to distend it for lens placement.  The Verion digital marker was  used to align the implant at the intended axis.   A 20.5 diopter lens was then injected into the capsular bag.  It was rotated clockwise until the axis marks on the lens were approximately 15 degrees in the counterclockwise direction to the intended alignment.  The viscoelastic was aspirated from the eye using the irrigation aspiration handpiece.  Then, a Koch spatula through the sideport incision was used to rotate the lens in a clockwise direction until the axis markings of the intraocular lens were lined up with the Verion alignment.  Balanced salt solution was then used to hydrate the wounds. Cefuroxime 0.1 ml of a 10mg /ml solution was injected into the anterior chamber for a dose of 1 mg of intracameral antibiotic at the completion of the case.    The eye was noted to have a physiologic pressure and there was no wound leak noted.   Timolol and Brimonidine drops were applied to the eye.  The patient was taken to the recovery room in stable condition having had no complications of anesthesia or surgery.  Ivana Nicastro 01/29/2016, 8:34 AM

## 2016-01-29 NOTE — Transfer of Care (Signed)
Immediate Anesthesia Transfer of Care Note  Patient: Cesar Powell  Procedure(s) Performed: Procedure(s) with comments: CATARACT EXTRACTION PHACO AND INTRAOCULAR LENS PLACEMENT ([place]) left eye (Left) - RESTOR LENS  Patient Location: PACU  Anesthesia Type: MAC  Level of Consciousness: awake, alert  and patient cooperative  Airway and Oxygen Therapy: Patient Spontanous Breathing and Patient connected to supplemental oxygen  Post-op Assessment: Post-op Vital signs reviewed, Patient's Cardiovascular Status Stable, Respiratory Function Stable, Patent Airway and No signs of Nausea or vomiting  Post-op Vital Signs: Reviewed and stable  Complications: No apparent anesthesia complications

## 2016-01-29 NOTE — Anesthesia Preprocedure Evaluation (Signed)
Anesthesia Evaluation  Patient identified by MRN, date of birth, ID band  Reviewed: Allergy & Precautions, H&P , NPO status , Patient's Chart, lab work & pertinent test results  Airway Mallampati: II  TM Distance: >3 FB Neck ROM: full    Dental no notable dental hx.    Pulmonary sleep apnea , former smoker,    Pulmonary exam normal        Cardiovascular hypertension,  Rhythm:regular Rate:Normal     Neuro/Psych    GI/Hepatic   Endo/Other    Renal/GU      Musculoskeletal   Abdominal   Peds  Hematology   Anesthesia Other Findings   Reproductive/Obstetrics                             Anesthesia Physical  Anesthesia Plan  ASA: II  Anesthesia Plan: MAC   Post-op Pain Management:    Induction:   Airway Management Planned:   Additional Equipment:   Intra-op Plan:   Post-operative Plan:   Informed Consent: I have reviewed the patients History and Physical, chart, labs and discussed the procedure including the risks, benefits and alternatives for the proposed anesthesia with the patient or authorized representative who has indicated his/her understanding and acceptance.     Plan Discussed with: CRNA  Anesthesia Plan Comments:         Anesthesia Quick Evaluation  

## 2016-01-29 NOTE — Anesthesia Postprocedure Evaluation (Signed)
Anesthesia Post Note  Patient: Cesar Powell  Procedure(s) Performed: Procedure(s) (LRB): CATARACT EXTRACTION PHACO AND INTRAOCULAR LENS PLACEMENT ([place]) left eye (Left)  Patient location during evaluation: PACU Anesthesia Type: MAC Level of consciousness: awake and alert and oriented Pain management: satisfactory to patient Vital Signs Assessment: post-procedure vital signs reviewed and stable Respiratory status: spontaneous breathing, nonlabored ventilation and respiratory function stable Cardiovascular status: blood pressure returned to baseline and stable Postop Assessment: Adequate PO intake and No signs of nausea or vomiting Anesthetic complications: no    Raliegh Ip

## 2016-01-30 ENCOUNTER — Encounter: Payer: Self-pay | Admitting: Ophthalmology

## 2016-02-05 DIAGNOSIS — M9905 Segmental and somatic dysfunction of pelvic region: Secondary | ICD-10-CM | POA: Diagnosis not present

## 2016-02-05 DIAGNOSIS — M9903 Segmental and somatic dysfunction of lumbar region: Secondary | ICD-10-CM | POA: Diagnosis not present

## 2016-02-05 DIAGNOSIS — M955 Acquired deformity of pelvis: Secondary | ICD-10-CM | POA: Diagnosis not present

## 2016-02-05 DIAGNOSIS — M5416 Radiculopathy, lumbar region: Secondary | ICD-10-CM | POA: Diagnosis not present

## 2016-03-10 DIAGNOSIS — M5416 Radiculopathy, lumbar region: Secondary | ICD-10-CM | POA: Diagnosis not present

## 2016-03-10 DIAGNOSIS — M955 Acquired deformity of pelvis: Secondary | ICD-10-CM | POA: Diagnosis not present

## 2016-03-10 DIAGNOSIS — M9905 Segmental and somatic dysfunction of pelvic region: Secondary | ICD-10-CM | POA: Diagnosis not present

## 2016-03-10 DIAGNOSIS — M9903 Segmental and somatic dysfunction of lumbar region: Secondary | ICD-10-CM | POA: Diagnosis not present

## 2016-04-07 DIAGNOSIS — M9905 Segmental and somatic dysfunction of pelvic region: Secondary | ICD-10-CM | POA: Diagnosis not present

## 2016-04-07 DIAGNOSIS — M955 Acquired deformity of pelvis: Secondary | ICD-10-CM | POA: Diagnosis not present

## 2016-04-07 DIAGNOSIS — M9903 Segmental and somatic dysfunction of lumbar region: Secondary | ICD-10-CM | POA: Diagnosis not present

## 2016-04-07 DIAGNOSIS — C61 Malignant neoplasm of prostate: Secondary | ICD-10-CM | POA: Diagnosis not present

## 2016-04-07 DIAGNOSIS — M5416 Radiculopathy, lumbar region: Secondary | ICD-10-CM | POA: Diagnosis not present

## 2016-05-05 DIAGNOSIS — M5416 Radiculopathy, lumbar region: Secondary | ICD-10-CM | POA: Diagnosis not present

## 2016-05-05 DIAGNOSIS — M955 Acquired deformity of pelvis: Secondary | ICD-10-CM | POA: Diagnosis not present

## 2016-05-05 DIAGNOSIS — M9903 Segmental and somatic dysfunction of lumbar region: Secondary | ICD-10-CM | POA: Diagnosis not present

## 2016-05-05 DIAGNOSIS — M9905 Segmental and somatic dysfunction of pelvic region: Secondary | ICD-10-CM | POA: Diagnosis not present

## 2016-06-01 ENCOUNTER — Encounter: Payer: Self-pay | Admitting: Family Medicine

## 2016-06-01 ENCOUNTER — Ambulatory Visit (INDEPENDENT_AMBULATORY_CARE_PROVIDER_SITE_OTHER): Payer: Medicare Other | Admitting: Family Medicine

## 2016-06-01 VITALS — BP 140/60 | HR 55 | Temp 97.6°F | Ht 67.0 in | Wt 237.0 lb

## 2016-06-01 DIAGNOSIS — E785 Hyperlipidemia, unspecified: Secondary | ICD-10-CM

## 2016-06-01 DIAGNOSIS — R319 Hematuria, unspecified: Secondary | ICD-10-CM

## 2016-06-01 DIAGNOSIS — Z23 Encounter for immunization: Secondary | ICD-10-CM | POA: Diagnosis not present

## 2016-06-01 DIAGNOSIS — Z79899 Other long term (current) drug therapy: Secondary | ICD-10-CM

## 2016-06-01 DIAGNOSIS — R310 Gross hematuria: Secondary | ICD-10-CM | POA: Diagnosis not present

## 2016-06-01 LAB — LIPID PANEL
Cholesterol: 221 mg/dL — ABNORMAL HIGH (ref 0–200)
HDL: 63.1 mg/dL (ref >39.00)
LDL Cholesterol: 132 mg/dL — ABNORMAL HIGH (ref 0–99)
NONHDL: 157.9
TRIGLYCERIDES: 132 mg/dL (ref 0.0–149.0)
Total CHOL/HDL Ratio: 4
VLDL: 26.4 mg/dL (ref 0.0–40.0)

## 2016-06-01 LAB — POC URINALSYSI DIPSTICK (AUTOMATED)
BILIRUBIN UA: NEGATIVE
Blood, UA: NEGATIVE
GLUCOSE UA: NEGATIVE
KETONES UA: NEGATIVE
Leukocytes, UA: NEGATIVE
Nitrite, UA: NEGATIVE
Protein, UA: NEGATIVE
Urobilinogen, UA: 0.2
pH, UA: 6

## 2016-06-01 LAB — BASIC METABOLIC PANEL
BUN: 25 mg/dL — ABNORMAL HIGH (ref 6–23)
CO2: 29 mEq/L (ref 19–32)
CREATININE: 1.21 mg/dL (ref 0.40–1.50)
Calcium: 9.8 mg/dL (ref 8.4–10.5)
Chloride: 102 mEq/L (ref 96–112)
GFR: 60.76 mL/min (ref >60.00)
GLUCOSE: 112 mg/dL — AB (ref 70–99)
Potassium: 4.7 mEq/L (ref 3.5–5.1)
SODIUM: 139 meq/L (ref 135–145)

## 2016-06-01 LAB — CBC WITH DIFFERENTIAL/PLATELET
BASOS PCT: 0.7 % (ref 0.0–3.0)
Basophils Absolute: 0 10*3/uL (ref 0.0–0.1)
EOS PCT: 3.7 % (ref 0.0–5.0)
Eosinophils Absolute: 0.2 10*3/uL (ref 0.0–0.7)
HCT: 37.8 % — ABNORMAL LOW (ref 39.0–52.0)
Hemoglobin: 12.9 g/dL — ABNORMAL LOW (ref 13.0–17.0)
LYMPHS ABS: 1 10*3/uL (ref 0.7–4.0)
Lymphocytes Relative: 18.1 % (ref 12.0–46.0)
MCHC: 34.1 g/dL (ref 30.0–36.0)
MCV: 94.7 fl (ref 78.0–100.0)
MONO ABS: 0.5 10*3/uL (ref 0.1–1.0)
Monocytes Relative: 8.2 % (ref 3.0–12.0)
NEUTROS PCT: 69.3 % (ref 43.0–77.0)
Neutro Abs: 4 10*3/uL (ref 1.4–7.7)
Platelets: 218 10*3/uL (ref 150.0–400.0)
RBC: 3.99 Mil/uL — ABNORMAL LOW (ref 4.22–5.81)
RDW: 14 % (ref 11.5–15.5)
WBC: 5.8 10*3/uL (ref 4.0–10.5)

## 2016-06-01 LAB — HEPATIC FUNCTION PANEL
ALBUMIN: 4.6 g/dL (ref 3.5–5.2)
ALK PHOS: 51 U/L (ref 39–117)
ALT: 28 U/L (ref 0–53)
AST: 21 U/L (ref 0–37)
BILIRUBIN DIRECT: 0.1 mg/dL (ref 0.0–0.3)
Total Bilirubin: 0.4 mg/dL (ref 0.2–1.2)
Total Protein: 7.6 g/dL (ref 6.0–8.3)

## 2016-06-01 NOTE — Progress Notes (Signed)
Dr. Frederico Hamman T. Jacquis Paxton, MD, [facility] [place], [address] Phone: [phone] Fax: [phone]  06/01/2016  Patient: Cesar Powell, MRN: [id number], DOB: January 15, 1931, 82 y.o.  Primary Physician:  Owens Loffler, MD   Chief Complaint  Patient presents with  . Hematuria   Subjective:   Cesar Powell is a 82 y.o. very pleasant male patient who presents with the following:  September - PSA level is 0.4  Fasting now.   Starting in the summer - he had gross hematuria. Came back last week and had some clot in the urine.  Has seen Dr. Tyler Pita in [place]. Prostate cancer in 2005.   Past Medical History, Surgical History, Social History, Family History, Problem List, Medications, and Allergies have been reviewed and updated if relevant.  Patient Active Problem List   Diagnosis Date Noted  . Subtrochanteric fracture of femur ([place]) 10/08/2014  . Essential hypertension 09/06/2009  . ERECTILE DYSFUNCTION, MILD 07/25/2007  . METABOLIC SYNDROME X [id number]  . Obesity 06/22/2007  . DIVERTICULOSIS, COLON 06/22/2007  . Obstructive sleep apnea 06/22/2007  . Local recurrence of prostate cancer ([place]) 06/22/2007  . DIVERTICULITIS, HX OF 06/22/2007    Past Medical History:  Diagnosis Date  . H/O prostate cancer    was treated with radiation  . Hypertension   . Local recurrence of prostate cancer ([place]) 06/22/2007   Qualifier: Diagnosis of  By: Fuller Plan CMA (AAMA), [place]    . Shingles     Past Surgical History:  Procedure Laterality Date  . ABDOMINAL SURGERY     ruptured intestines   . CATARACT EXTRACTION W/PHACO Left 01/29/2016   Procedure: CATARACT EXTRACTION PHACO AND INTRAOCULAR LENS PLACEMENT ([place]) left eye;  Surgeon: Leandrew Koyanagi, MD;  Location: [facility];  Service: Ophthalmology;  Laterality: Left;  RESTOR LENS  . FEMUR IM NAIL Right 10/08/2014   Procedure: INTRAMEDULLARY (IM) NAIL FEMORAL;   Surgeon: Rod Can, MD;  Location: [facility];  Service: Orthopedics;  Laterality: Right;  PER DANIELLE 110 MIN    Social History   Social History  . Marital status: Married    Spouse name: N/A  . Number of children: N/A  . Years of education: N/A   Occupational History  . retired    Social History Main Topics  . Smoking status: Former Smoker    Packs/day: 1.00    Years: 15.00    Types: Cigarettes    Quit date: 09/14/1965  . Smokeless tobacco: Never Used  . Alcohol use 3.5 oz/week    7 Standard drinks or equivalent per week     Comment: daily  . Drug use: No  . Sexual activity: Not on file   Other Topics Concern  . Not on file   Social History Narrative  . No narrative on file    Family History  Problem Relation Age of Onset  . Heart disease Brother   . Heart attack Brother     No Known Allergies  Medication list reviewed and updated in full in [facility].   GEN: No acute illnesses, no fevers, chills. GI: No n/v/d, eating normally Pulm: No SOB Interactive and getting along well at home.  Otherwise, ROS is as per the HPI.  Objective:   BP 140/60   Pulse (!) 55   Temp 97.6 F (36.4 C) (Oral)   Ht 5\' 7"  (1.702 m)   Wt 237 lb (107.5 kg)   BMI 37.12  kg/m   GEN: WDWN, NAD, Non-toxic, A & O x 3 HEENT: Atraumatic, Normocephalic. Neck supple. No masses, No LAD. Ears and Nose: No external deformity. CV: RRR, No M/G/R. No JVD. No thrill. No extra heart sounds. EXTR: No c/c/e NEURO Normal gait.  PSYCH: Normally interactive. Conversant. Not depressed or anxious appearing.  Calm demeanor.   Laboratory and Imaging Data: Results for orders placed or performed in visit on 06/01/16  POCT Urinalysis Dipstick (Automated)  Result Value Ref Range   Color, UA yellow    Clarity, UA clear    Glucose, UA negative    Bilirubin, UA negative    Ketones, UA negative    Spec Grav, UA >=1.030    Blood, UA negative    pH, UA 6.0    Protein, UA negative     Urobilinogen, UA 0.2    Nitrite, UA negative    Leukocytes, UA Negative Negative     Assessment and Plan:   Gross hematuria - Plan: Ambulatory referral to Urology, CBC with Differential/Platelet  Need for prophylactic vaccination and inoculation against influenza  Hematuria, unspecified type - Plan: POCT Urinalysis Dipstick (Automated), Ambulatory referral to Urology  Hyperlipidemia LDL goal <70 - Plan: Lipid panel  Encounter for long-term current use of medication - Plan: Basic metabolic panel, Hepatic function panel  Patient has no blood in his urine today in the office, but he has had multiple episodes of macroscopic gross hematuria at home including urinating clots of blood and urinating large quantities of blood. We reviewed that in this case bladder neoplasm or other urologic neoplasm can't be excluded, and he needs a full workup. We will send him to see urology.  Obtain other basic labs and CBC.  Follow-up: No Follow-up on file.  Orders Placed This Encounter  Procedures  . CBC with Differential/Platelet  . Basic metabolic panel  . Hepatic function panel  . Lipid panel  . Ambulatory referral to Urology  . POCT Urinalysis Dipstick (Automated)    Signed,  Reva Pinkley T. Rashema Seawright, MD   Patient's Medications  New Prescriptions   No medications on file  Previous Medications   ASCORBIC ACID (VITAMIN C) 1000 MG TABLET    Take 1,000 mg by mouth daily.   CHOLECALCIFEROL (VITAMIN D) 1000 UNITS TABLET    Take 1,000 Units by mouth daily.   GLUCOSAMINE-CHONDROITIN (GLUCOSAMINE CHONDR COMPLEX PO)    Take 2 capsules by mouth daily.   MULTIPLE VITAMIN (MULTIVITAMIN) CAPSULE    Take 1 capsule by mouth daily.   OMEGA-3 FATTY ACIDS (FISH OIL) 1200 MG CAPS    Take 1,200 mg by mouth daily.  Modified Medications   No medications on file  Discontinued Medications   No medications on file

## 2016-06-01 NOTE — Patient Instructions (Signed)

## 2016-06-01 NOTE — Progress Notes (Signed)
Pre visit review using our clinic review tool, if applicable. No additional management support is needed unless otherwise documented below in the visit note. 

## 2016-06-09 DIAGNOSIS — M955 Acquired deformity of pelvis: Secondary | ICD-10-CM | POA: Diagnosis not present

## 2016-06-09 DIAGNOSIS — M9905 Segmental and somatic dysfunction of pelvic region: Secondary | ICD-10-CM | POA: Diagnosis not present

## 2016-06-09 DIAGNOSIS — M5416 Radiculopathy, lumbar region: Secondary | ICD-10-CM | POA: Diagnosis not present

## 2016-06-09 DIAGNOSIS — M9903 Segmental and somatic dysfunction of lumbar region: Secondary | ICD-10-CM | POA: Diagnosis not present

## 2016-06-11 ENCOUNTER — Ambulatory Visit (INDEPENDENT_AMBULATORY_CARE_PROVIDER_SITE_OTHER): Payer: Medicare Other | Admitting: Urology

## 2016-06-11 ENCOUNTER — Encounter: Payer: Self-pay | Admitting: Urology

## 2016-06-11 VITALS — Ht 68.0 in | Wt 240.3 lb

## 2016-06-11 DIAGNOSIS — Z8546 Personal history of malignant neoplasm of prostate: Secondary | ICD-10-CM

## 2016-06-11 DIAGNOSIS — R310 Gross hematuria: Secondary | ICD-10-CM | POA: Diagnosis not present

## 2016-06-11 DIAGNOSIS — N3941 Urge incontinence: Secondary | ICD-10-CM

## 2016-06-11 LAB — URINALYSIS, COMPLETE
Bilirubin, UA: NEGATIVE
GLUCOSE, UA: NEGATIVE
KETONES UA: NEGATIVE
Leukocytes, UA: NEGATIVE
NITRITE UA: NEGATIVE
RBC, UA: NEGATIVE
SPEC GRAV UA: 1.02 (ref 1.005–1.030)
UUROB: 0.2 mg/dL (ref 0.2–1.0)
pH, UA: 7 (ref 5.0–7.5)

## 2016-06-11 LAB — MICROSCOPIC EXAMINATION: Bacteria, UA: NONE SEEN

## 2016-06-11 LAB — BLADDER SCAN AMB NON-IMAGING: SCAN RESULT: 219

## 2016-06-11 MED ORDER — TAMSULOSIN HCL 0.4 MG PO CAPS: 0.4000 mg | ORAL_CAPSULE | Freq: Every day | ORAL | 11 refills | Status: DC

## 2016-06-11 NOTE — Progress Notes (Signed)
06/11/2016 10:18 AM   [name] 1930/09/02 [id number]  Referring provider: Owens Loffler, MD [address] [place], [place] [address]  Chief Complaint  Patient presents with  . New Patient (Initial Visit)    gross hematuria    HPI: The patient is an 82 year old gentleman with a past medical history of prostate cancer treated with radiation who presents with new onset gross hematuria.  1. Gross hematuria    2. History of prostate cancer Treated with radiation in 2005. After radiation his PSA ran at approximately 0.03 for a number of years. He was then started on Axiron for low testosterone. His PSA then started arise eventually to his high as 18. This was 2 years ago. His Axiron was stopped at this point and he was given 1 dose of Lupron. His PSA then fell and has been at 0.4 for the last year. He was last checked in September 2017.  3. Urinary urgency The patient complains of severe urgency and urge incontinence. He finds this very bothersome. He does note an occasional weak stream but this does not bother him. He feels that he empties his bladder. He had taken Cialis daily for this which helped his urinary urgency.  PVR: 219 cc   PMH: Past Medical History:  Diagnosis Date  . H/O prostate cancer    was treated with radiation  . Hypertension   . Local recurrence of prostate cancer ([place]) 06/22/2007   Qualifier: Diagnosis of  By: Fuller Plan CMA (AAMA), [place]    . Shingles     Surgical History: Past Surgical History:  Procedure Laterality Date  . ABDOMINAL SURGERY     ruptured intestines   . CATARACT EXTRACTION W/PHACO Left 01/29/2016   Procedure: CATARACT EXTRACTION PHACO AND INTRAOCULAR LENS PLACEMENT ([place]) left eye;  Surgeon: Leandrew Koyanagi, MD;  Location: [facility];  Service: Ophthalmology;  Laterality: Left;  RESTOR LENS  . FEMUR IM NAIL Right 10/08/2014   Procedure: INTRAMEDULLARY (IM) NAIL FEMORAL;  Surgeon: Rod Can, MD;  Location:  [facility];  Service: Orthopedics;  Laterality: Right;  PER DANIELLE 110 MIN  . OTHER SURGICAL HISTORY  2006   Prostate Radiation Surgery     Home Medications:    Medication List       Accurate as of 06/11/16 10:18 AM. Always use your most recent med list.          cholecalciferol 1000 units tablet Commonly known as:  VITAMIN D Take 1,000 Units by mouth daily.   Fish Oil 1200 MG Caps Take 1,200 mg by mouth daily.   GLUCOSAMINE CHONDR COMPLEX PO Take 2 capsules by mouth daily.   multivitamin capsule Take 1 capsule by mouth daily.   tamsulosin 0.4 MG Caps capsule Commonly known as:  FLOMAX Take 1 capsule (0.4 mg total) by mouth daily.   vitamin C 1000 MG tablet Take 1,000 mg by mouth daily.       Allergies: No Known Allergies  Family History: Family History  Problem Relation Age of Onset  . Heart disease Brother   . Heart attack Brother   . Prostate cancer Neg Hx   . Bladder Cancer Neg Hx   . Kidney cancer Neg Hx     Social History:  reports that he quit smoking about 50 years ago. His smoking use included Cigarettes. He has a 15.00 pack-year smoking history. He has never used smokeless tobacco. He reports that he drinks about 3.5 oz of alcohol per week . He  reports that he does not use drugs.  ROS: UROLOGY Frequent Urination?: Yes Hard to postpone urination?: Yes Burning/pain with urination?: No Get up at night to urinate?: Yes Leakage of urine?: Yes Urine stream starts and stops?: No Trouble starting stream?: No Do you have to strain to urinate?: No Blood in urine?: Yes Urinary tract infection?: No Sexually transmitted disease?: No Injury to kidneys or bladder?: No Painful intercourse?: No Weak stream?: Yes Erection problems?: Yes Penile pain?: No  Gastrointestinal Nausea?: No Vomiting?: No Indigestion/heartburn?: No Diarrhea?: No Constipation?: No  Constitutional Fever: No Night sweats?: No Weight loss?: No Fatigue?: No  Skin Skin  rash/lesions?: Yes Itching?: Yes  Eyes Blurred vision?: No Double vision?: No  Ears/Nose/Throat Sore throat?: No Sinus problems?: Yes  Hematologic/Lymphatic Swollen glands?: No Easy bruising?: No  Cardiovascular Leg swelling?: No Chest pain?: No  Respiratory Cough?: No Shortness of breath?: No  Endocrine Excessive thirst?: No  Musculoskeletal Back pain?: No Joint pain?: No  Neurological Headaches?: No Dizziness?: No  Psychologic Depression?: No Anxiety?: No  Physical Exam: Ht 5\' 8"  (1.727 m)   Wt 240 lb 4.8 oz (109 kg)   BMI 36.54 kg/m   Constitutional:  Alert and oriented, No acute distress. HEENT: [place] AT, moist mucus membranes.  Trachea midline, no masses. Cardiovascular: No clubbing, cyanosis, or edema. Respiratory: Normal respiratory effort, no increased work of breathing. GI: Abdomen is soft, nontender, nondistended, no abdominal masses GU: No CVA tenderness.  Skin: No rashes, bruises or suspicious lesions. Lymph: No cervical or inguinal adenopathy. Neurologic: Grossly intact, no focal deficits, moving all 4 extremities. Psychiatric: Normal mood and affect.  Laboratory Data: Lab Results  Component Value Date   WBC 5.8 06/01/2016   HGB 12.9 (L) 06/01/2016   HCT 37.8 (L) 06/01/2016   MCV 94.7 06/01/2016   PLT 218.0 06/01/2016    Lab Results  Component Value Date   CREATININE 1.21 06/01/2016    Lab Results  Component Value Date   PSA 0.47 10/21/2015   PSA 0.32 08/26/2009   PSA 0.32 12/07/2007    No results found for: TESTOSTERONE  No results found for: HGBA1C  Urinalysis    Component Value Date/Time   COLORURINE lt. yellow 06/13/2007 1105   APPEARANCEUR Clear 06/13/2007 1105   LABSPEC <1.005 06/13/2007 1105   PHURINE 6.0 06/13/2007 1105   HGBUR negative 06/13/2007 1105   BILIRUBINUR negative 06/01/2016 0953   PROTEINUR negative 06/01/2016 0953   UROBILINOGEN 0.2 06/01/2016 0953   UROBILINOGEN negative 06/13/2007 1105    NITRITE negative 06/01/2016 0953   NITRITE negative 06/13/2007 1105   LEUKOCYTESUR Negative 06/01/2016 0953      Assessment & Plan:    1. Gross hematuria We'll check CT urogram followed by office cystoscopy   2. History of prostate cancer We'll need to keep a close eye on his PSA given his history. We'll plan to check it 6 months after his last PSA which will be March 2017  3. Urinary urgency with incontinence with incomplete bladder empyting We'll start the patient on Flomax 0.4 mg. At this time, he is not a good candidate for an anticholinergic or beta 3 agonist due to his high PVR.   Return for after CT for cystoscopy.  Nickie Retort, MD  [facility] [address] [place], [place] [address] [phone]

## 2016-07-03 ENCOUNTER — Encounter (INDEPENDENT_AMBULATORY_CARE_PROVIDER_SITE_OTHER): Payer: Medicare Other | Admitting: Urology

## 2016-07-03 DIAGNOSIS — Z8546 Personal history of malignant neoplasm of prostate: Secondary | ICD-10-CM

## 2016-07-03 DIAGNOSIS — R310 Gross hematuria: Secondary | ICD-10-CM | POA: Diagnosis not present

## 2016-07-03 DIAGNOSIS — R3915 Urgency of urination: Secondary | ICD-10-CM

## 2016-07-03 LAB — URINALYSIS, COMPLETE
BILIRUBIN UA: NEGATIVE
GLUCOSE, UA: NEGATIVE
KETONES UA: NEGATIVE
LEUKOCYTES UA: NEGATIVE
Nitrite, UA: NEGATIVE
PH UA: 6.5 (ref 5.0–7.5)
RBC UA: NEGATIVE
SPEC GRAV UA: 1.02 (ref 1.005–1.030)
UUROB: 0.2 mg/dL (ref 0.2–1.0)

## 2016-07-03 LAB — MICROSCOPIC EXAMINATION: Bacteria, UA: NONE SEEN

## 2016-07-03 MED ORDER — CIPROFLOXACIN HCL 500 MG PO TABS: 500.0000 mg | ORAL_TABLET | Freq: Once | ORAL | Status: DC

## 2016-07-03 MED ORDER — LIDOCAINE HCL 2 % EX GEL: 1.0000 "application " | Freq: Once | CUTANEOUS | Status: DC

## 2016-07-07 DIAGNOSIS — M9903 Segmental and somatic dysfunction of lumbar region: Secondary | ICD-10-CM | POA: Diagnosis not present

## 2016-07-07 DIAGNOSIS — M955 Acquired deformity of pelvis: Secondary | ICD-10-CM | POA: Diagnosis not present

## 2016-07-07 DIAGNOSIS — M5416 Radiculopathy, lumbar region: Secondary | ICD-10-CM | POA: Diagnosis not present

## 2016-07-07 DIAGNOSIS — M9905 Segmental and somatic dysfunction of pelvic region: Secondary | ICD-10-CM | POA: Diagnosis not present

## 2016-07-08 NOTE — Progress Notes (Signed)
This encounter was created in error - please disregard.

## 2016-07-15 ENCOUNTER — Ambulatory Visit
Admission: RE | Admit: 2016-07-15 | Discharge: 2016-07-15 | Disposition: A | Discharge status: Home / Self Care | Payer: Medicare Other | Source: Ambulatory Visit | Attending: Urology | Admitting: Urology

## 2016-07-15 DIAGNOSIS — X58XXXA Exposure to other specified factors, initial encounter: Secondary | ICD-10-CM | POA: Diagnosis not present

## 2016-07-15 DIAGNOSIS — R319 Hematuria, unspecified: Secondary | ICD-10-CM | POA: Diagnosis not present

## 2016-07-15 DIAGNOSIS — N210 Calculus in bladder: Secondary | ICD-10-CM | POA: Diagnosis not present

## 2016-07-15 DIAGNOSIS — C61 Malignant neoplasm of prostate: Secondary | ICD-10-CM | POA: Insufficient documentation

## 2016-07-15 DIAGNOSIS — R310 Gross hematuria: Secondary | ICD-10-CM | POA: Diagnosis not present

## 2016-07-15 DIAGNOSIS — I2510 Atherosclerotic heart disease of native coronary artery without angina pectoris: Secondary | ICD-10-CM | POA: Insufficient documentation

## 2016-07-15 DIAGNOSIS — R160 Hepatomegaly, not elsewhere classified: Secondary | ICD-10-CM | POA: Insufficient documentation

## 2016-07-15 DIAGNOSIS — K760 Fatty (change of) liver, not elsewhere classified: Secondary | ICD-10-CM | POA: Diagnosis not present

## 2016-07-15 DIAGNOSIS — S2242XA Multiple fractures of ribs, left side, initial encounter for closed fracture: Secondary | ICD-10-CM | POA: Diagnosis not present

## 2016-07-15 DIAGNOSIS — I700 Atherosclerosis of aorta: Secondary | ICD-10-CM | POA: Diagnosis not present

## 2016-07-15 LAB — POCT I-STAT CREATININE: CREATININE: 1.1 mg/dL (ref 0.61–1.24)

## 2016-07-15 MED ORDER — IOPAMIDOL (ISOVUE-300) INJECTION 61%
125.0000 mL | Freq: Once | INTRAVENOUS | Status: AC | PRN
  Administered 2016-07-15: 125 mL via INTRAVENOUS

## 2016-07-23 ENCOUNTER — Other Ambulatory Visit: Payer: Self-pay

## 2016-07-23 ENCOUNTER — Ambulatory Visit (INDEPENDENT_AMBULATORY_CARE_PROVIDER_SITE_OTHER): Payer: Medicare Other | Admitting: Urology

## 2016-07-23 ENCOUNTER — Encounter: Payer: Self-pay | Admitting: Urology

## 2016-07-23 ENCOUNTER — Other Ambulatory Visit: Payer: Self-pay | Admitting: Radiology

## 2016-07-23 VITALS — BP 184/74 | HR 66 | Ht 68.0 in | Wt 240.3 lb

## 2016-07-23 DIAGNOSIS — R310 Gross hematuria: Secondary | ICD-10-CM | POA: Diagnosis not present

## 2016-07-23 DIAGNOSIS — Z01818 Encounter for other preprocedural examination: Secondary | ICD-10-CM

## 2016-07-23 DIAGNOSIS — Z8546 Personal history of malignant neoplasm of prostate: Secondary | ICD-10-CM | POA: Diagnosis not present

## 2016-07-23 DIAGNOSIS — N210 Calculus in bladder: Secondary | ICD-10-CM

## 2016-07-23 DIAGNOSIS — N400 Enlarged prostate without lower urinary tract symptoms: Secondary | ICD-10-CM

## 2016-07-23 LAB — URINALYSIS, COMPLETE
Bilirubin, UA: NEGATIVE
GLUCOSE, UA: NEGATIVE
Ketones, UA: NEGATIVE
Leukocytes, UA: NEGATIVE
NITRITE UA: NEGATIVE
PROTEIN UA: NEGATIVE
Specific Gravity, UA: 1.02 (ref 1.005–1.030)
Urobilinogen, Ur: 0.2 mg/dL (ref 0.2–1.0)
pH, UA: 7.5 (ref 5.0–7.5)

## 2016-07-23 LAB — MICROSCOPIC EXAMINATION: Bacteria, UA: NONE SEEN

## 2016-07-23 MED ORDER — CIPROFLOXACIN HCL 500 MG PO TABS
500.0000 mg | ORAL_TABLET | Freq: Once | ORAL | Status: AC
  Administered 2016-07-24: 500 mg via ORAL

## 2016-07-23 MED ORDER — LIDOCAINE HCL 2 % EX GEL
1.0000 "application " | Freq: Once | CUTANEOUS | Status: AC
  Administered 2016-07-24: 1 via URETHRAL

## 2016-07-23 NOTE — Progress Notes (Signed)
07/23/2016 11:53 AM   Cesar Powell 18-Jan-1931 [id number]  Referring provider: Owens Loffler, MD [address] [place], [place] [address]  Chief Complaint  Patient presents with  . Cysto    gross hematuria    HPI: The patient is an 82 year old gentleman with a past medical history of prostate cancer treated with radiation who presents with new onset gross hematuria.  1. Gross hematuria Patient with gross hematuria for 18 months.   2. History of prostate cancer Treated with radiation in 2005. After radiation his PSA ran at approximately 0.03 for a number of years. He was then started on Axiron for low testosterone. His PSA then started arise eventually to his high as 18. This was 2 years ago. His Axiron was stopped at this point and he was given 1 dose of Lupron. His PSA then fell and has been at 0.4 for the last year. He was last checked in September 2017.  3. Urinary urgency The patient complains of severe urgency and urge incontinence. He finds this very bothersome. He does note an occasional weak stream but this does not bother him. He feels that he empties his bladder. He had taken Cialis daily for this which helped his urinary urgency.  PVR: 219 cc    Due to his elevated PVR, he was started only on Flomax. He has had much improvement of his symptoms with this medication. His stream is much stronger at this time. He feels that he empties his bladder better. His urgency and urge incontinence has improved.   PMH: Past Medical History:  Diagnosis Date  . H/O prostate cancer    was treated with radiation  . Hypertension   . Local recurrence of prostate cancer ([place]) 06/22/2007   Qualifier: Diagnosis of  By: Fuller Plan CMA (AAMA), [place]    . Shingles     Surgical History: Past Surgical History:  Procedure Laterality Date  . ABDOMINAL SURGERY     ruptured intestines   . CATARACT EXTRACTION W/PHACO Left 01/29/2016   Procedure: CATARACT EXTRACTION PHACO AND  INTRAOCULAR LENS PLACEMENT ([place]) left eye;  Surgeon: Leandrew Koyanagi, MD;  Location: [facility];  Service: Ophthalmology;  Laterality: Left;  RESTOR LENS  . FEMUR IM NAIL Right 10/08/2014   Procedure: INTRAMEDULLARY (IM) NAIL FEMORAL;  Surgeon: Rod Can, MD;  Location: [facility];  Service: Orthopedics;  Laterality: Right;  PER DANIELLE 110 MIN  . OTHER SURGICAL HISTORY  2006   Prostate Radiation Surgery     Home Medications:  Allergies as of 07/23/2016   No Known Allergies     Medication List       Accurate as of 07/23/16 11:53 AM. Always use your most recent med list.          cholecalciferol 1000 units tablet Commonly known as:  VITAMIN D Take 1,000 Units by mouth daily.   Fish Oil 1200 MG Caps Take 1,200 mg by mouth daily.   GLUCOSAMINE CHONDR COMPLEX PO Take 2 capsules by mouth daily.   multivitamin capsule Take 1 capsule by mouth daily.   tamsulosin 0.4 MG Caps capsule Commonly known as:  FLOMAX Take 1 capsule (0.4 mg total) by mouth daily.   vitamin C 1000 MG tablet Take 1,000 mg by mouth daily.       Allergies: No Known Allergies  Family History: Family History  Problem Relation Age of Onset  . Heart disease Brother   . Heart attack Brother   . Prostate cancer Neg Hx   .  Bladder Cancer Neg Hx   . Kidney cancer Neg Hx     Social History:  reports that he quit smoking about 50 years ago. His smoking use included Cigarettes. He has a 15.00 pack-year smoking history. He has never used smokeless tobacco. He reports that he drinks about 3.5 oz of alcohol per week . He reports that he does not use drugs.  ROS:                                        Physical Exam: BP (!) 184/74   Pulse 66   Ht 5\' 8"  (1.727 m)   Wt 240 lb 4.8 oz (109 kg)   BMI 36.54 kg/m   Constitutional:  Alert and oriented, No acute distress. HEENT: [place] AT, moist mucus membranes.  Trachea midline, no masses. Cardiovascular: No clubbing, cyanosis,  or edema. Respiratory: Normal respiratory effort, no increased work of breathing. GI: Abdomen is soft, nontender, nondistended, no abdominal masses GU: No CVA tenderness.  Skin: No rashes, bruises or suspicious lesions. Lymph: No cervical or inguinal adenopathy. Neurologic: Grossly intact, no focal deficits, moving all 4 extremities. Psychiatric: Normal mood and affect.  Laboratory Data: Lab Results  Component Value Date   WBC 5.8 06/01/2016   HGB 12.9 (L) 06/01/2016   HCT 37.8 (L) 06/01/2016   MCV 94.7 06/01/2016   PLT 218.0 06/01/2016    Lab Results  Component Value Date   CREATININE 1.10 07/15/2016    Lab Results  Component Value Date   PSA 0.47 10/21/2015   PSA 0.32 08/26/2009   PSA 0.32 12/07/2007    No results found for: TESTOSTERONE  No results found for: HGBA1C  Urinalysis    Component Value Date/Time   COLORURINE lt. yellow 06/13/2007 1105   APPEARANCEUR Clear 07/03/2016 0938   LABSPEC <1.005 06/13/2007 1105   PHURINE 6.0 06/13/2007 1105   GLUCOSEU Negative 07/03/2016 0938   HGBUR negative 06/13/2007 1105   BILIRUBINUR Negative 07/03/2016 0938   PROTEINUR Trace (A) 07/03/2016 0938   UROBILINOGEN 0.2 06/01/2016 0953   UROBILINOGEN negative 06/13/2007 1105   NITRITE Negative 07/03/2016 0938   NITRITE negative 06/13/2007 1105   LEUKOCYTESUR Negative 07/03/2016 0938    Pertinent Imaging: CLINICAL DATA:  Intermittent hematuria for 18 months. Prostate cancer. Remote history of intestinal surgery secondary to trauma.  EXAM: CT ABDOMEN AND PELVIS WITHOUT AND WITH CONTRAST  TECHNIQUE: Multidetector CT imaging of the abdomen and pelvis was performed following the standard protocol before and following the bolus administration of intravenous contrast.  CONTRAST:  177mL ISOVUE-300 IOPAMIDOL (ISOVUE-300) INJECTION 61%  COMPARISON:  None.  FINDINGS: Lower chest: Clear lung bases. Normal heart size without pericardial or pleural effusion. Right  coronary artery atherosclerosis.  Hepatobiliary: Moderate hepatic steatosis. No focal liver lesion. Normal gallbladder, without biliary ductal dilatation.  Pancreas: Normal, without mass or ductal dilatation.  Spleen: Normal in size, without focal abnormality.  Adrenals/Urinary Tract: Normal adrenal glands. No renal calculi or hydronephrosis. No hydroureter or ureteric calculi. Right-sided bladder stone measures 16 mm.  Bilateral too small to characterize renal lesions. A lower pole right renal cyst of 2.7 cm. Moderate renal collecting system opacification on delayed images. Portions of the right greater than left ureters are incompletely opacified on delayed images. No filling defects in the opacified portions.  The bladder wall appears thickened on image 76/ series 5. This could be partially due to underdistention.  Subtle pericystic interstitial thickening. No other bladder filling defects on moderately well distended delayed images.  Stomach/Bowel: Normal stomach, without wall thickening. Extensive colonic diverticulosis. Normal terminal ileum. Minimal nonobstructive small bowel within an area of left paracentral pelvic wall laxity or hernia on image 65/series 5.  Vascular/Lymphatic: Aortic and branch vessel atherosclerosis. Small nodes in the jejunal mesentery with increased density in the mesenteric fat. Example image 39/series 5. Calcification within likely represents a calcified node and measures 1.7 cm on image 45/series 5. No pelvic sidewall adenopathy.  Reproductive: Degraded evaluation of the pelvis, secondary to beam hardening artifact from right femoral fixation. Right paracentral prostate calcification measures 8 mm on image 84/series 5.  Other: No significant free fluid.  Musculoskeletal: Right proximal femoral fixation. Degenerative sclerosis of the bilateral sacroiliac joints. 10th through twelfth left rib fractures are likely acute/  subacute.  IMPRESSION: 1. Dominant bladder stone. 2. Bladder wall thickening with subtle surrounding interstitial thickening. Findings are suspicious for cystitis. A component of this could be related to the outlet obstruction. 3. Degraded evaluation of the pelvis, secondary to beam hardening artifact from proximal right femoral fixation. Calcification in the right paracentral prostate is favored to be parenchymal. Urethral calculus felt less likely (especially given absence of dysuria). 4. No evidence of metastatic disease. 5. Hepatic steatosis and hepatomegaly. 6. 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 7.  Coronary artery atherosclerosis. Aortic atherosclerosis. 8. Nonobstructive small bowel containing hernia or laxity involving the anterior pelvic wall. 9. Lower left rib fractures, likely acute to subacute.   Cystoscopy Procedure Note  Patient identification was confirmed, informed consent was obtained, and patient was prepped using Betadine solution.  Lidocaine jelly was administered per urethral meatus.    Preoperative abx where received prior to procedure.     Pre-Procedure: - Inspection reveals a normal caliber ureteral meatus.  Procedure: The flexible cystoscope was introduced without difficulty - No urethral strictures/lesions are present. - Enlarged prostate Visually obstructive. 6 cm in length - Tight bladder neck - Bilateral ureteral orifices identified - Bladder mucosa  reveals no ulcers, tumors, or lesions - 2-3 cm bladder stone - No trabeculation  Retroflexion shows no intravesical lobe   Assessment & Plan:    1. Bladder stone I discussed with the patient that bladder stones typically form as a result of incomplete bladder emptying which we know the patient was having an issue with. We discussed the standard is to perform a cystoscopy litholapaxy followed by TURP at the same time to relieve bladder  outlet obstruction t prevent future stone formation. The stone developed before he was started on Flomax which has improved his symptoms greatly. Since the patient has started his Flomax, he has such a significant improvement urinary symptoms that he is not interested in undergoing a TURP at this time. He states that if he develops a second bladder stones and he will consider at that time. For now, he is elected to proceed with cystoscopy litholapaxy only. He understands the risks, benefits, indications of this procedure. He understands the risks include bleeding, infection, iatrogenic injury among others. All questions were answered. He has elected to proceed.  2. Gross hematuria Negative work up except for bladder stone  3. History of prostate cancer We'll need to keep a close eye on his PSA given his history. We'll plan to check it 6 months after his last PSA which will be March 2017  4. BPH Continue flomax   Nickie Retort, MD  [facility] [address], [place], [place] [address] [phone]

## 2016-07-24 ENCOUNTER — Other Ambulatory Visit: Payer: Self-pay | Admitting: Radiology

## 2016-07-24 ENCOUNTER — Telehealth: Payer: Self-pay | Admitting: Radiology

## 2016-07-24 DIAGNOSIS — N210 Calculus in bladder: Secondary | ICD-10-CM

## 2016-07-24 NOTE — Telephone Encounter (Signed)
Notified pt of surgery scheduled with Dr Pilar Jarvis on 08/14/16, pre-admit testing appt on 08/07/16 @2 :00 & to call day prior to surgery for arrival time to SDS. Pt voices understanding.

## 2016-07-29 ENCOUNTER — Other Ambulatory Visit: Payer: Self-pay | Admitting: Radiology

## 2016-08-07 ENCOUNTER — Other Ambulatory Visit: Payer: Medicare Other

## 2016-08-07 ENCOUNTER — Encounter
Admission: RE | Admit: 2016-08-07 | Discharge: 2016-08-07 | Disposition: A | Discharge status: Home / Self Care | Payer: Medicare Other | Source: Ambulatory Visit | Attending: Urology | Admitting: Urology

## 2016-08-07 DIAGNOSIS — I10 Essential (primary) hypertension: Secondary | ICD-10-CM | POA: Diagnosis not present

## 2016-08-07 DIAGNOSIS — L988 Other specified disorders of the skin and subcutaneous tissue: Secondary | ICD-10-CM | POA: Insufficient documentation

## 2016-08-07 DIAGNOSIS — Z01812 Encounter for preprocedural laboratory examination: Secondary | ICD-10-CM | POA: Insufficient documentation

## 2016-08-07 DIAGNOSIS — Z01810 Encounter for preprocedural cardiovascular examination: Secondary | ICD-10-CM | POA: Insufficient documentation

## 2016-08-07 HISTORY — DX: Other nail disorders: L60.8

## 2016-08-07 LAB — CBC
HCT: 37.4 % — ABNORMAL LOW (ref 40.0–52.0)
Hemoglobin: 12.6 g/dL — ABNORMAL LOW (ref 13.0–18.0)
MCH: 32 pg (ref 26.0–34.0)
MCHC: 33.8 g/dL (ref 32.0–36.0)
MCV: 94.6 fL (ref 80.0–100.0)
PLATELETS: 221 10*3/uL (ref 150–440)
RBC: 3.95 MIL/uL — AB (ref 4.40–5.90)
RDW: 13.5 % (ref 11.5–14.5)
WBC: 6.4 10*3/uL (ref 3.8–10.6)

## 2016-08-07 LAB — BASIC METABOLIC PANEL
Anion gap: 7 (ref 5–15)
BUN: 20 mg/dL (ref 6–20)
CALCIUM: 9.7 mg/dL (ref 8.9–10.3)
CO2: 27 mmol/L (ref 22–32)
CREATININE: 1.06 mg/dL (ref 0.61–1.24)
Chloride: 105 mmol/L (ref 101–111)
GFR calc non Af Amer: >60 mL/min (ref >60)
Glucose, Bld: 103 mg/dL — ABNORMAL HIGH (ref 65–99)
Potassium: 4.5 mmol/L (ref 3.5–5.1)
SODIUM: 139 mmol/L (ref 135–145)

## 2016-08-07 NOTE — Pre-Procedure Instructions (Signed)
Dr Randa Lynn called regarding abnormal EKG.   "I think he will be fine, no clearance needed." per Dr Randa Lynn.

## 2016-08-07 NOTE — Patient Instructions (Signed)
  Your procedure is scheduled on: 08/14/16 Fri Report to Same Day Surgery 2nd floor medical mall [facility] Entrance-take elevator on left to 2nd floor.  Check in with surgery information desk.) To find out your arrival time please call [phone] between 1PM - 3PM on 08/13/16 Thurs  Remember: Instructions that are not followed completely may result in serious medical risk, up to and including death, or upon the discretion of your surgeon and anesthesiologist your surgery may need to be rescheduled.    _x___ 1. Do not eat food or drink liquids after midnight. No gum chewing or hard candies.     __x__ 2. No Alcohol for 24 hours before or after surgery.   __x__3. No Smoking for 24 prior to surgery.   ____  4. Bring all medications with you on the day of surgery if instructed.    __x__ 5. Notify your doctor if there is any change in your medical condition     (cold, fever, infections).     Do not wear jewelry, make-up, hairpins, clips or nail polish.  Do not wear lotions, powders, or perfumes. You may wear deodorant.  Do not shave 48 hours prior to surgery. Men may shave face and neck.  Do not bring valuables to the hospital.    [facility] is not responsible for any belongings or valuables.               Contacts, dentures or bridgework may not be worn into surgery.  Leave your suitcase in the car. After surgery it may be brought to your room.  For patients admitted to the hospital, discharge time is determined by your treatment team.   Patients discharged the day of surgery will not be allowed to drive home.  You will need someone to drive you home and stay with you the night of your procedure.    Please read over the following fact sheets that you were given:   [facility] Preparing for Surgery and or MRSA Information   _x___ Take these medicines the morning of surgery with A SIP OF WATER:    1. None  2.  3.  4.  5.  6.  ____Fleets enema or Magnesium Citrate as  directed.   _x___ Use CHG Soap or sage wipes as directed on instruction sheet   ____ Use inhalers on the day of surgery and bring to hospital day of surgery  ____ Stop metformin 2 days prior to surgery    ____ Take 1/2 of usual insulin dose the night before surgery and none on the morning of           surgery.   ____ Stop Aspirin, Coumadin, Pllavix ,Eliquis, Effient, or Pradaxa  x__ Stop Anti-inflammatories such as Advil, Aleve, Ibuprofen, Motrin, Naproxen,          Naprosyn, Goodies powders or aspirin products. Ok to take Tylenol.   _x___ Stop supplements until after surgery.  Stop fish oil until after surgery.  ____ Bring C-Pap to the hospital.

## 2016-08-09 LAB — URINE CULTURE: CULTURE: NO GROWTH

## 2016-08-10 DIAGNOSIS — M9903 Segmental and somatic dysfunction of lumbar region: Secondary | ICD-10-CM | POA: Diagnosis not present

## 2016-08-10 DIAGNOSIS — M955 Acquired deformity of pelvis: Secondary | ICD-10-CM | POA: Diagnosis not present

## 2016-08-10 DIAGNOSIS — M5416 Radiculopathy, lumbar region: Secondary | ICD-10-CM | POA: Diagnosis not present

## 2016-08-10 DIAGNOSIS — M9905 Segmental and somatic dysfunction of pelvic region: Secondary | ICD-10-CM | POA: Diagnosis not present

## 2016-08-14 ENCOUNTER — Encounter: Payer: Self-pay | Admitting: *Deleted

## 2016-08-14 ENCOUNTER — Ambulatory Visit: Payer: Medicare Other | Admitting: Anesthesiology

## 2016-08-14 ENCOUNTER — Encounter
Admission: RE | Disposition: A | Discharge status: Home / Self Care | Payer: Self-pay | Source: Ambulatory Visit | Attending: Urology

## 2016-08-14 ENCOUNTER — Telehealth: Payer: Self-pay

## 2016-08-14 ENCOUNTER — Ambulatory Visit
Admission: RE | Admit: 2016-08-14 | Discharge: 2016-08-14 | Disposition: A | Discharge status: Home / Self Care | Payer: Medicare Other | Source: Ambulatory Visit | Attending: Urology | Admitting: Urology

## 2016-08-14 DIAGNOSIS — G4730 Sleep apnea, unspecified: Secondary | ICD-10-CM | POA: Insufficient documentation

## 2016-08-14 DIAGNOSIS — Z8546 Personal history of malignant neoplasm of prostate: Secondary | ICD-10-CM | POA: Diagnosis not present

## 2016-08-14 DIAGNOSIS — Z9842 Cataract extraction status, left eye: Secondary | ICD-10-CM | POA: Insufficient documentation

## 2016-08-14 DIAGNOSIS — Z961 Presence of intraocular lens: Secondary | ICD-10-CM | POA: Diagnosis not present

## 2016-08-14 DIAGNOSIS — I10 Essential (primary) hypertension: Secondary | ICD-10-CM | POA: Diagnosis not present

## 2016-08-14 DIAGNOSIS — N210 Calculus in bladder: Secondary | ICD-10-CM | POA: Insufficient documentation

## 2016-08-14 DIAGNOSIS — N3941 Urge incontinence: Secondary | ICD-10-CM | POA: Diagnosis not present

## 2016-08-14 DIAGNOSIS — Z923 Personal history of irradiation: Secondary | ICD-10-CM | POA: Diagnosis not present

## 2016-08-14 HISTORY — PX: HOLMIUM LASER APPLICATION: SHX5852

## 2016-08-14 HISTORY — PX: CYSTOSCOPY WITH LITHOLAPAXY: SHX1425

## 2016-08-14 SURGERY — CYSTOSCOPY, WITH BLADDER CALCULUS LITHOLAPAXY
Anesthesia: General

## 2016-08-14 MED ORDER — ONDANSETRON HCL 4 MG/2ML IJ SOLN
INTRAMUSCULAR | Status: AC
  Filled 2016-08-14: qty 2

## 2016-08-14 MED ORDER — MIDAZOLAM HCL 2 MG/2ML IJ SOLN
INTRAMUSCULAR | Status: AC
  Filled 2016-08-14: qty 2

## 2016-08-14 MED ORDER — HYDROCODONE-ACETAMINOPHEN 5-325 MG PO TABS: 1.0000 | ORAL_TABLET | ORAL | 0 refills | Status: DC | PRN

## 2016-08-14 MED ORDER — OXYCODONE HCL 5 MG PO TABS: 5.0000 mg | ORAL_TABLET | Freq: Once | ORAL | Status: DC | PRN

## 2016-08-14 MED ORDER — GLYCOPYRROLATE 0.2 MG/ML IJ SOLN
INTRAMUSCULAR | Status: AC
  Filled 2016-08-14: qty 1

## 2016-08-14 MED ORDER — FAMOTIDINE 20 MG PO TABS
ORAL_TABLET | ORAL | Status: DC
Start: 2016-08-14 — End: 2016-08-14
  Filled 2016-08-14: qty 1

## 2016-08-14 MED ORDER — FENTANYL CITRATE (PF) 100 MCG/2ML IJ SOLN: 25.0000 ug | INTRAMUSCULAR | Status: DC | PRN

## 2016-08-14 MED ORDER — OXYCODONE HCL 5 MG/5ML PO SOLN: 5.0000 mg | Freq: Once | ORAL | Status: DC | PRN

## 2016-08-14 MED ORDER — PROPOFOL 10 MG/ML IV BOLUS
INTRAVENOUS | Status: AC
  Filled 2016-08-14: qty 20

## 2016-08-14 MED ORDER — CEPHALEXIN 500 MG PO CAPS: 500.0000 mg | ORAL_CAPSULE | Freq: Three times a day (TID) | ORAL | 0 refills | Status: DC

## 2016-08-14 MED ORDER — FENTANYL CITRATE (PF) 100 MCG/2ML IJ SOLN
INTRAMUSCULAR | Status: AC
  Filled 2016-08-14: qty 2

## 2016-08-14 MED ORDER — LIDOCAINE 2% (20 MG/ML) 5 ML SYRINGE
INTRAMUSCULAR | Status: AC
  Filled 2016-08-14: qty 5

## 2016-08-14 MED ORDER — FAMOTIDINE 20 MG PO TABS
20.0000 mg | ORAL_TABLET | Freq: Once | ORAL | Status: AC
  Administered 2016-08-14: 20 mg via ORAL

## 2016-08-14 MED ORDER — CEFAZOLIN SODIUM-DEXTROSE 2-4 GM/100ML-% IV SOLN
INTRAVENOUS | Status: AC
  Filled 2016-08-14: qty 100

## 2016-08-14 MED ORDER — CEFAZOLIN SODIUM-DEXTROSE 2-4 GM/100ML-% IV SOLN
2.0000 g | INTRAVENOUS | Status: AC
  Administered 2016-08-14: 2 g via INTRAVENOUS

## 2016-08-14 MED ORDER — LACTATED RINGERS IV SOLN
INTRAVENOUS | Status: DC
  Administered 2016-08-14: 07:00:00 via INTRAVENOUS

## 2016-08-14 MED ORDER — LIDOCAINE HCL (CARDIAC) 20 MG/ML IV SOLN
INTRAVENOUS | Status: DC | PRN
  Administered 2016-08-14: 100 mg via INTRAVENOUS

## 2016-08-14 MED ORDER — FENTANYL CITRATE (PF) 100 MCG/2ML IJ SOLN
INTRAMUSCULAR | Status: DC | PRN
  Administered 2016-08-14: 50 ug via INTRAVENOUS

## 2016-08-14 MED ORDER — GLYCOPYRROLATE 0.2 MG/ML IJ SOLN
INTRAMUSCULAR | Status: DC | PRN
  Administered 2016-08-14: 0.2 mg via INTRAVENOUS

## 2016-08-14 MED ORDER — PROPOFOL 10 MG/ML IV BOLUS
INTRAVENOUS | Status: DC | PRN
  Administered 2016-08-14: 170 mg via INTRAVENOUS
  Administered 2016-08-14: 30 mg via INTRAVENOUS

## 2016-08-14 SURGICAL SUPPLY — 18 items
BACTOSHIELD CHG 4% 4OZ (MISCELLANEOUS) ×1
FEE TECHNICIAN ONLY PER HOUR (MISCELLANEOUS) ×3 IMPLANT
FIBER LASER 1000 (Laser) ×3 IMPLANT
GLOVE BIO SURGEON STRL SZ7 (GLOVE) ×3 IMPLANT
GLOVE BIO SURGEON STRL SZ7.5 (GLOVE) ×3 IMPLANT
GOWN STRL REUS W/ TWL LRG LVL3 (GOWN DISPOSABLE) ×2 IMPLANT
GOWN STRL REUS W/ TWL XL LVL3 (GOWN DISPOSABLE) ×2 IMPLANT
GOWN STRL REUS W/TWL LRG LVL3 (GOWN DISPOSABLE) ×1
GOWN STRL REUS W/TWL XL LVL3 (GOWN DISPOSABLE) ×1
KIT RM TURNOVER CYSTO AR (KITS) ×3 IMPLANT
PACK CYSTO AR (MISCELLANEOUS) ×3 IMPLANT
SCRUB CHG 4% DYNA-HEX 4OZ (MISCELLANEOUS) ×2 IMPLANT
SENSORWIRE 0.038 NOT ANGLED (WIRE)
SET CYSTO W/LG BORE CLAMP LF (SET/KITS/TRAYS/PACK) ×3 IMPLANT
SURGILUBE 2OZ TUBE FLIPTOP (MISCELLANEOUS) ×3 IMPLANT
SYRINGE IRR TOOMEY STRL 70CC (SYRINGE) ×3 IMPLANT
TUBING CONNECTING 10 (TUBING) ×3 IMPLANT
WIRE SENSOR 0.038 NOT ANGLED (WIRE) IMPLANT

## 2016-08-14 NOTE — Op Note (Signed)
Date of procedure: 08/14/16  Preoperative diagnosis:  1. Bladder stone   Postoperative diagnosis:  1. Bladder stone   Procedure: 1. Cystoscopy litholapaxy-2 cm  Surgeon: Baruch Gouty, MD  Anesthesia: General  Complications: None  Intraoperative findings: The patient is a 2 cm bladder stone was broken into small fragments with the laser. These were evacuated with Ellik evacuator. the stone burden remained after the procedure.  EBL: None  Specimens: Bladder stone  Drains: None  Disposition: Stable to the postanesthesia care unit  Indication for procedure: The patient is a 83 y.o. male with history of prostate cancer status post radiation therapy 2005 presents today for removal of a bladder stone that was found during his gross hematuria workup. The stone did form while he is not on any medications for a large prostate. We did discuss that often bladder stone was removed in the prostate is reduced/the TURP the same time. The patient is not interested in a TURP at this time since his symptoms resolved after starting Flomax. I think this is reasonable.  After reviewing the management options for treatment, the patient elected to proceed with the above surgical procedure(s). We have discussed the potential benefits and risks of the procedure, side effects of the proposed treatment, the likelihood of the patient achieving the goals of the procedure, and any potential problems that might occur during the procedure or recuperation. Informed consent has been obtained.  Description of procedure: The patient was met in the preoperative area. All risks, benefits, and indications of the procedure were described in great detail. The patient consented to the procedure. Preoperative antibiotics were given. The patient was taken to the operative theater. General anesthesia was induced per the anesthesia service. The patient was then placed in the dorsal lithotomy position and prepped and draped in the  usual sterile fashion. A preoperative timeout was called.   A 21 French 30 cystoscope was inserted into the patient's bladder per urethra atraumatically. Pan cystoscopy revealed a 2 cm bladder stone and no other pathology. Stone was broken into small fragments with laser lithotripsy. It was quite soft. The fragments were then evacuated with Ellik evacuator. Pan cystoscopy revealed no more significant fragments. The patient's bladder was drained. He was woken from anesthesia and transferred stable condition to the postanesthesia care unit.  Plan: The patient will follow-up in 2 months with PSA prior to monitor his response to his previous radiation therapy. If he would develop another bladder stone, he should undergo a TURP at that time.  Baruch Gouty, M.D.

## 2016-08-14 NOTE — Transfer of Care (Signed)
Immediate Anesthesia Transfer of Care Note  Patient: Cesar Powell  Procedure(s) Performed: Procedure(s): CYSTOSCOPY WITH LITHOLAPAXY (N/A)  Patient Location: PACU  Anesthesia Type:General  Level of Consciousness: sedated  Airway & Oxygen Therapy: Patient Spontanous Breathing and Patient connected to face mask oxygen  Post-op Assessment: Report given to RN and Post -op Vital signs reviewed and stable  Post vital signs: Reviewed and stable  Last Vitals:  Vitals:   08/14/16 0618 08/14/16 0812  BP: (!) 169/60 140/64  Pulse: (!) 57 65  Resp: 16 12  Temp: 36.6 C 0000000 C    Complications: No apparent anesthesia complications

## 2016-08-14 NOTE — Anesthesia Procedure Notes (Signed)
Procedure Name: LMA Insertion Date/Time: 08/14/2016 7:40 AM Performed by: Doreen Salvage Pre-anesthesia Checklist: Patient identified, Patient being monitored, Timeout performed, Emergency Drugs available and Suction available Patient Re-evaluated:Patient Re-evaluated prior to inductionOxygen Delivery Method: Circle system utilized Preoxygenation: Pre-oxygenation with 100% oxygen Intubation Type: IV induction Ventilation: Mask ventilation without difficulty LMA: LMA inserted LMA Size: 5.0 Tube type: Oral Number of attempts: 1 Placement Confirmation: positive ETCO2 and breath sounds checked- equal and bilateral Tube secured with: Tape Dental Injury: Teeth and Oropharynx as per pre-operative assessment

## 2016-08-14 NOTE — Anesthesia Preprocedure Evaluation (Signed)
Anesthesia Evaluation  Patient identified by MRN, date of birth, ID band Patient awake    Reviewed: Allergy & Precautions, H&P , NPO status , Patient's Chart, lab work & pertinent test results  History of Anesthesia Complications Negative for: history of anesthetic complications  Airway Mallampati: III  TM Distance: >3 FB Neck ROM: limited    Dental  (+) Poor Dentition, Chipped, Missing   Pulmonary neg shortness of breath, sleep apnea and Continuous Positive Airway Pressure Ventilation , former smoker,    Pulmonary exam normal breath sounds clear to auscultation       Cardiovascular Exercise Tolerance: Good hypertension, (-) angina(-) Past MI and (-) DOE Normal cardiovascular exam Rhythm:regular Rate:Normal     Neuro/Psych negative neurological ROS  negative psych ROS   GI/Hepatic negative GI ROS, Neg liver ROS,   Endo/Other  negative endocrine ROS  Renal/GU      Musculoskeletal   Abdominal   Peds  Hematology negative hematology ROS (+)   Anesthesia Other Findings Past Medical History: No date: Discoloration of nail No date: H/O prostate cancer     Comment: was treated with radiation No date: Hypertension 06/22/2007: Local recurrence of prostate cancer (HCC)     Comment: Qualifier: Diagnosis of  By: Fuller Plan CMA               (AAMA), Lugene   No date: Shingles  Past Surgical History: No date: ABDOMINAL SURGERY     Comment: ruptured intestines  01/29/2016: CATARACT EXTRACTION W/PHACO Left     Comment: Procedure: CATARACT EXTRACTION PHACO AND               INTRAOCULAR LENS PLACEMENT ([place]) left eye;                Surgeon: Leandrew Koyanagi, MD;  Location:               [place];  Service: Ophthalmology;                Laterality: Left;  RESTOR LENS 10/08/2014: FEMUR IM NAIL Right     Comment: Procedure: INTRAMEDULLARY (IM) NAIL FEMORAL;                Surgeon: Rod Can, MD;  Location: [place];                Service: Orthopedics;  Laterality: Right;  PER               DANIELLE 110 MIN 2006: OTHER SURGICAL HISTORY     Comment: Prostate Radiation Surgery   BMI    Body Mass Index:  35.43 kg/m      Reproductive/Obstetrics negative OB ROS                             Anesthesia Physical Anesthesia Plan  ASA: III  Anesthesia Plan: General LMA   Post-op Pain Management:    Induction:   Airway Management Planned:   Additional Equipment:   Intra-op Plan:   Post-operative Plan:   Informed Consent: I have reviewed the patients History and Physical, chart, labs and discussed the procedure including the risks, benefits and alternatives for the proposed anesthesia with the patient or authorized representative who has indicated his/her understanding and acceptance.   Dental Advisory Given  Plan Discussed with: Anesthesiologist, CRNA and Surgeon  Anesthesia Plan Comments:         Anesthesia Quick Evaluation

## 2016-08-14 NOTE — Telephone Encounter (Signed)
-----   Message from Nickie Retort, MD sent at 08/14/2016  8:17 AM EST ----- Patient needs to see me in 2 months with PSA prior

## 2016-08-14 NOTE — Discharge Instructions (Signed)

## 2016-08-14 NOTE — Interval H&P Note (Signed)
History and Physical Interval Note:  08/14/2016 7:12 AM  Cesar Powell  has presented today for surgery, with the diagnosis of bladder stone  The various methods of treatment have been discussed with the patient and family. After consideration of risks, benefits and other options for treatment, the patient has consented to  Procedure(s): CYSTOSCOPY WITH LITHOLAPAXY (N/A) as a surgical intervention .  The patient's history has been reviewed, patient examined, no change in status, stable for surgery.  I have reviewed the patient's chart and labs.  Questions were answered to the patient's satisfaction.    RRR Lungs clear   Nickie Retort

## 2016-08-14 NOTE — Anesthesia Postprocedure Evaluation (Signed)
Anesthesia Post Note  Patient: Cesar Powell  Procedure(s) Performed: Procedure(s) (LRB): CYSTOSCOPY WITH LITHOLAPAXY (N/A) HOLMIUM LASER APPLICATION  Patient location during evaluation: PACU Anesthesia Type: General Level of consciousness: awake and alert Pain management: pain level controlled Vital Signs Assessment: post-procedure vital signs reviewed and stable Respiratory status: spontaneous breathing, nonlabored ventilation, respiratory function stable and patient connected to nasal cannula oxygen Cardiovascular status: blood pressure returned to baseline and stable Postop Assessment: no signs of nausea or vomiting Anesthetic complications: no     Last Vitals:  Vitals:   08/14/16 0900 08/14/16 0911  BP: (!) 163/71 (!) 158/64  Pulse: 62 (!) 56  Resp: 15 15  Temp: 36.4 C     Last Pain:  Vitals:   08/14/16 0900  TempSrc:   PainSc: 0-No pain                 Precious Haws Piscitello

## 2016-08-14 NOTE — H&P (View-Only) (Signed)
07/23/2016 11:53 AM   Cesar Powell May 14, 1931 [id number]  Referring provider: Owens Loffler, MD [address] [place], [place] [address]  Chief Complaint  Patient presents with  . Cysto    gross hematuria    HPI: The patient is an 83 year old gentleman with a past medical history of prostate cancer treated with radiation who presents with new onset gross hematuria.  1. Gross hematuria Patient with gross hematuria for 18 months.   2. History of prostate cancer Treated with radiation in 2005. After radiation his PSA ran at approximately 0.03 for a number of years. He was then started on Axiron for low testosterone. His PSA then started arise eventually to his high as 18. This was 2 years ago. His Axiron was stopped at this point and he was given 1 dose of Lupron. His PSA then fell and has been at 0.4 for the last year. He was last checked in September 2017.  3. Urinary urgency The patient complains of severe urgency and urge incontinence. He finds this very bothersome. He does note an occasional weak stream but this does not bother him. He feels that he empties his bladder. He had taken Cialis daily for this which helped his urinary urgency.  PVR: 219 cc    Due to his elevated PVR, he was started only on Flomax. He has had much improvement of his symptoms with this medication. His stream is much stronger at this time. He feels that he empties his bladder better. His urgency and urge incontinence has improved.   PMH: Past Medical History:  Diagnosis Date  . H/O prostate cancer    was treated with radiation  . Hypertension   . Local recurrence of prostate cancer ([place]) 06/22/2007   Qualifier: Diagnosis of  By: Fuller Plan CMA (AAMA), Lugene    . Shingles     Surgical History: Past Surgical History:  Procedure Laterality Date  . ABDOMINAL SURGERY     ruptured intestines   . CATARACT EXTRACTION W/PHACO Left 01/29/2016   Procedure: CATARACT EXTRACTION PHACO AND  INTRAOCULAR LENS PLACEMENT ([place]) left eye;  Surgeon: Leandrew Koyanagi, MD;  Location: [place];  Service: Ophthalmology;  Laterality: Left;  RESTOR LENS  . FEMUR IM NAIL Right 10/08/2014   Procedure: INTRAMEDULLARY (IM) NAIL FEMORAL;  Surgeon: Rod Can, MD;  Location: [place];  Service: Orthopedics;  Laterality: Right;  PER DANIELLE 110 MIN  . OTHER SURGICAL HISTORY  2006   Prostate Radiation Surgery     Home Medications:  Allergies as of 07/23/2016   No Known Allergies     Medication List       Accurate as of 07/23/16 11:53 AM. Always use your most recent med list.          cholecalciferol 1000 units tablet Commonly known as:  VITAMIN D Take 1,000 Units by mouth daily.   Fish Oil 1200 MG Caps Take 1,200 mg by mouth daily.   GLUCOSAMINE CHONDR COMPLEX PO Take 2 capsules by mouth daily.   multivitamin capsule Take 1 capsule by mouth daily.   tamsulosin 0.4 MG Caps capsule Commonly known as:  FLOMAX Take 1 capsule (0.4 mg total) by mouth daily.   vitamin C 1000 MG tablet Take 1,000 mg by mouth daily.       Allergies: No Known Allergies  Family History: Family History  Problem Relation Age of Onset  . Heart disease Brother   . Heart attack Brother   . Prostate cancer Neg Hx   .  Bladder Cancer Neg Hx   . Kidney cancer Neg Hx     Social History:  reports that he quit smoking about 50 years ago. His smoking use included Cigarettes. He has a 15.00 pack-year smoking history. He has never used smokeless tobacco. He reports that he drinks about 3.5 oz of alcohol per week . He reports that he does not use drugs.  ROS:                                        Physical Exam: BP (!) 184/74   Pulse 66   Ht 5\' 8"  (1.727 m)   Wt 240 lb 4.8 oz (109 kg)   BMI 36.54 kg/m   Constitutional:  Alert and oriented, No acute distress. HEENT: [place] AT, moist mucus membranes.  Trachea midline, no masses. Cardiovascular: No clubbing, cyanosis,  or edema. Respiratory: Normal respiratory effort, no increased work of breathing. GI: Abdomen is soft, nontender, nondistended, no abdominal masses GU: No CVA tenderness.  Skin: No rashes, bruises or suspicious lesions. Lymph: No cervical or inguinal adenopathy. Neurologic: Grossly intact, no focal deficits, moving all 4 extremities. Psychiatric: Normal mood and affect.  Laboratory Data: Lab Results  Component Value Date   WBC 5.8 06/01/2016   HGB 12.9 (L) 06/01/2016   HCT 37.8 (L) 06/01/2016   MCV 94.7 06/01/2016   PLT 218.0 06/01/2016    Lab Results  Component Value Date   CREATININE 1.10 07/15/2016    Lab Results  Component Value Date   PSA 0.47 10/21/2015   PSA 0.32 08/26/2009   PSA 0.32 12/07/2007    No results found for: TESTOSTERONE  No results found for: HGBA1C  Urinalysis    Component Value Date/Time   COLORURINE lt. yellow 06/13/2007 1105   APPEARANCEUR Clear 07/03/2016 0938   LABSPEC <1.005 06/13/2007 1105   PHURINE 6.0 06/13/2007 1105   GLUCOSEU Negative 07/03/2016 0938   HGBUR negative 06/13/2007 1105   BILIRUBINUR Negative 07/03/2016 0938   PROTEINUR Trace (A) 07/03/2016 0938   UROBILINOGEN 0.2 06/01/2016 0953   UROBILINOGEN negative 06/13/2007 1105   NITRITE Negative 07/03/2016 0938   NITRITE negative 06/13/2007 1105   LEUKOCYTESUR Negative 07/03/2016 0938    Pertinent Imaging: CLINICAL DATA:  Intermittent hematuria for 18 months. Prostate cancer. Remote history of intestinal surgery secondary to trauma.  EXAM: CT ABDOMEN AND PELVIS WITHOUT AND WITH CONTRAST  TECHNIQUE: Multidetector CT imaging of the abdomen and pelvis was performed following the standard protocol before and following the bolus administration of intravenous contrast.  CONTRAST:  156mL ISOVUE-300 IOPAMIDOL (ISOVUE-300) INJECTION 61%  COMPARISON:  None.  FINDINGS: Lower chest: Clear lung bases. Normal heart size without pericardial or pleural effusion. Right  coronary artery atherosclerosis.  Hepatobiliary: Moderate hepatic steatosis. No focal liver lesion. Normal gallbladder, without biliary ductal dilatation.  Pancreas: Normal, without mass or ductal dilatation.  Spleen: Normal in size, without focal abnormality.  Adrenals/Urinary Tract: Normal adrenal glands. No renal calculi or hydronephrosis. No hydroureter or ureteric calculi. Right-sided bladder stone measures 16 mm.  Bilateral too small to characterize renal lesions. A lower pole right renal cyst of 2.7 cm. Moderate renal collecting system opacification on delayed images. Portions of the right greater than left ureters are incompletely opacified on delayed images. No filling defects in the opacified portions.  The bladder wall appears thickened on image 76/ series 5. This could be partially due to underdistention.  Subtle pericystic interstitial thickening. No other bladder filling defects on moderately well distended delayed images.  Stomach/Bowel: Normal stomach, without wall thickening. Extensive colonic diverticulosis. Normal terminal ileum. Minimal nonobstructive small bowel within an area of left paracentral pelvic wall laxity or hernia on image 65/series 5.  Vascular/Lymphatic: Aortic and branch vessel atherosclerosis. Small nodes in the jejunal mesentery with increased density in the mesenteric fat. Example image 39/series 5. Calcification within likely represents a calcified node and measures 1.7 cm on image 45/series 5. No pelvic sidewall adenopathy.  Reproductive: Degraded evaluation of the pelvis, secondary to beam hardening artifact from right femoral fixation. Right paracentral prostate calcification measures 8 mm on image 84/series 5.  Other: No significant free fluid.  Musculoskeletal: Right proximal femoral fixation. Degenerative sclerosis of the bilateral sacroiliac joints. 10th through twelfth left rib fractures are likely acute/  subacute.  IMPRESSION: 1. Dominant bladder stone. 2. Bladder wall thickening with subtle surrounding interstitial thickening. Findings are suspicious for cystitis. A component of this could be related to the outlet obstruction. 3. Degraded evaluation of the pelvis, secondary to beam hardening artifact from proximal right femoral fixation. Calcification in the right paracentral prostate is favored to be parenchymal. Urethral calculus felt less likely (especially given absence of dysuria). 4. No evidence of metastatic disease. 5. Hepatic steatosis and hepatomegaly. 6. 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 7.  Coronary artery atherosclerosis. Aortic atherosclerosis. 8. Nonobstructive small bowel containing hernia or laxity involving the anterior pelvic wall. 9. Lower left rib fractures, likely acute to subacute.   Cystoscopy Procedure Note  Patient identification was confirmed, informed consent was obtained, and patient was prepped using Betadine solution.  Lidocaine jelly was administered per urethral meatus.    Preoperative abx where received prior to procedure.     Pre-Procedure: - Inspection reveals a normal caliber ureteral meatus.  Procedure: The flexible cystoscope was introduced without difficulty - No urethral strictures/lesions are present. - Enlarged prostate Visually obstructive. 6 cm in length - Tight bladder neck - Bilateral ureteral orifices identified - Bladder mucosa  reveals no ulcers, tumors, or lesions - 2-3 cm bladder stone - No trabeculation  Retroflexion shows no intravesical lobe   Assessment & Plan:    1. Bladder stone I discussed with the patient that bladder stones typically form as a result of incomplete bladder emptying which we know the patient was having an issue with. We discussed the standard is to perform a cystoscopy litholapaxy followed by TURP at the same time to relieve bladder  outlet obstruction t prevent future stone formation. The stone developed before he was started on Flomax which has improved his symptoms greatly. Since the patient has started his Flomax, he has such a significant improvement urinary symptoms that he is not interested in undergoing a TURP at this time. He states that if he develops a second bladder stones and he will consider at that time. For now, he is elected to proceed with cystoscopy litholapaxy only. He understands the risks, benefits, indications of this procedure. He understands the risks include bleeding, infection, iatrogenic injury among others. All questions were answered. He has elected to proceed.  2. Gross hematuria Negative work up except for bladder stone  3. History of prostate cancer We'll need to keep a close eye on his PSA given his history. We'll plan to check it 6 months after his last PSA which will be March 2017  4. BPH Continue flomax   Nickie Retort, MD  [facility] [address], [place], [place] [address] [phone]

## 2016-08-24 LAB — STONE ANALYSIS
Ca Oxalate,Dihydrate: 35 %
Ca Oxalate,Monohydr.: 30 %
Ca phos cry stone ql IR: 35 %
STONE WEIGHT KSTONE: 304 mg

## 2016-09-08 DIAGNOSIS — M5416 Radiculopathy, lumbar region: Secondary | ICD-10-CM | POA: Diagnosis not present

## 2016-09-08 DIAGNOSIS — M9903 Segmental and somatic dysfunction of lumbar region: Secondary | ICD-10-CM | POA: Diagnosis not present

## 2016-09-08 DIAGNOSIS — M9905 Segmental and somatic dysfunction of pelvic region: Secondary | ICD-10-CM | POA: Diagnosis not present

## 2016-09-08 DIAGNOSIS — M955 Acquired deformity of pelvis: Secondary | ICD-10-CM | POA: Diagnosis not present

## 2016-09-14 DIAGNOSIS — H2511 Age-related nuclear cataract, right eye: Secondary | ICD-10-CM | POA: Diagnosis not present

## 2016-09-14 DIAGNOSIS — H35373 Puckering of macula, bilateral: Secondary | ICD-10-CM | POA: Diagnosis not present

## 2016-10-06 DIAGNOSIS — M5416 Radiculopathy, lumbar region: Secondary | ICD-10-CM | POA: Diagnosis not present

## 2016-10-06 DIAGNOSIS — M9905 Segmental and somatic dysfunction of pelvic region: Secondary | ICD-10-CM | POA: Diagnosis not present

## 2016-10-06 DIAGNOSIS — M9903 Segmental and somatic dysfunction of lumbar region: Secondary | ICD-10-CM | POA: Diagnosis not present

## 2016-10-06 DIAGNOSIS — M955 Acquired deformity of pelvis: Secondary | ICD-10-CM | POA: Diagnosis not present

## 2016-10-08 ENCOUNTER — Other Ambulatory Visit: Payer: Medicare Other

## 2016-10-08 DIAGNOSIS — Z8546 Personal history of malignant neoplasm of prostate: Secondary | ICD-10-CM

## 2016-10-09 LAB — PSA: Prostate Specific Ag, Serum: 1.9 ng/mL (ref 0.0–4.0)

## 2016-10-13 DIAGNOSIS — H2511 Age-related nuclear cataract, right eye: Secondary | ICD-10-CM | POA: Diagnosis not present

## 2016-10-15 ENCOUNTER — Encounter: Payer: Self-pay | Admitting: Urology

## 2016-10-15 ENCOUNTER — Ambulatory Visit (INDEPENDENT_AMBULATORY_CARE_PROVIDER_SITE_OTHER): Payer: Medicare Other | Admitting: Urology

## 2016-10-15 VITALS — BP 158/67 | HR 63 | Ht 67.5 in | Wt 243.5 lb

## 2016-10-15 DIAGNOSIS — N400 Enlarged prostate without lower urinary tract symptoms: Secondary | ICD-10-CM

## 2016-10-15 DIAGNOSIS — C61 Malignant neoplasm of prostate: Secondary | ICD-10-CM | POA: Diagnosis not present

## 2016-10-15 NOTE — Progress Notes (Signed)
10/15/2016 11:32 AM   Cesar Powell 1931-02-03 [id number]  Referring provider: Owens Loffler, MD [address] [place], [place] [address]  Chief Complaint  Patient presents with  . Follow-up    bladder stone    HPI: The patient is an 83 year old gentleman with a past medical history of prostate cancer treated with radiation who presents with new onset gross hematuria.  1. Gross hematuria Patient with gross hematuria for 18 months. Work up revealed bladder stone which was recently treated with cystolitholapaxy.  2. History of prostate cancer Treated with radiation in 2005. After radiation his PSA ranat approximately 0.03 for a number of years. He was then started on Axironfor low testosterone. His PSA then started to rise eventually to as high as 18. This was 2 years ago. His Axiron was stopped at this point and he was given 1 dose of Lupron. His PSA then fell and has been at 0.4 for the last year. He was last checked in March 2017. His PSA rose to 1.9 in March 2018.  3. BPH Currently on Flomax. Prior to starting Flomax, he had severe urinary urgency with urge incontinence. At that time, his PVR was 219 cc. After starting Flomax, his symptoms greatly improved.  4. Bladder stone The patient underwent cystoscopy litholapaxy in January 2018. He did not undergo a TURP at that time because he was not on medication when the bladder stone formed and once medication started his PVR was low and his symptoms resolved. His bladder stone was 35% calcium oxalate dihydrate, 30% calcium oxalate monohydrate, and 35% calcium phosphate.  PMH: Past Medical History:  Diagnosis Date  . Discoloration of nail   . H/O prostate cancer    was treated with radiation  . Hypertension   . Local recurrence of prostate cancer ([place]) 06/22/2007   Qualifier: Diagnosis of  By: Fuller Plan CMA (AAMA), Lugene    . Shingles     Surgical History: Past Surgical History:  Procedure Laterality Date  .  ABDOMINAL SURGERY     ruptured intestines   . CATARACT EXTRACTION W/PHACO Left 01/29/2016   Procedure: CATARACT EXTRACTION PHACO AND INTRAOCULAR LENS PLACEMENT ([place]) left eye;  Surgeon: Leandrew Koyanagi, MD;  Location: [place];  Service: Ophthalmology;  Laterality: Left;  RESTOR LENS  . CYSTOSCOPY WITH LITHOLAPAXY N/A 08/14/2016   Procedure: CYSTOSCOPY WITH LITHOLAPAXY;  Surgeon: Nickie Retort, MD;  Location: [facility];  Service: Urology;  Laterality: N/A;  . FEMUR IM NAIL Right 10/08/2014   Procedure: INTRAMEDULLARY (IM) NAIL FEMORAL;  Surgeon: Rod Can, MD;  Location: [place];  Service: Orthopedics;  Laterality: Right;  PER DANIELLE 110 MIN  . HOLMIUM LASER APPLICATION  4/66/5993   Procedure: HOLMIUM LASER APPLICATION;  Surgeon: Nickie Retort, MD;  Location: [facility];  Service: Urology;;  . OTHER SURGICAL HISTORY  2006   Prostate Radiation Surgery     Home Medications:  Allergies as of 10/15/2016   No Known Allergies     Medication List       Accurate as of 10/15/16 11:32 AM. Always use your most recent med list.          cholecalciferol 1000 units tablet Commonly known as:  VITAMIN D Take 1,000 Units by mouth daily.   Fish Oil 1200 MG Caps Take 1,200 mg by mouth daily.   GLUCOSAMINE CHONDR COMPLEX PO Take 2 capsules by mouth daily.   multivitamin capsule Take 1 capsule by mouth daily.   tamsulosin 0.4 MG  Caps capsule Commonly known as:  FLOMAX Take 1 capsule (0.4 mg total) by mouth daily.   vitamin C 1000 MG tablet Take 1,000 mg by mouth daily.       Allergies: No Known Allergies  Family History: Family History  Problem Relation Age of Onset  . Heart disease Brother   . Heart attack Brother   . Prostate cancer Neg Hx   . Bladder Cancer Neg Hx   . Kidney cancer Neg Hx     Social History:  reports that he quit smoking about 51 years ago. His smoking use included Cigarettes. He has a 15.00 pack-year smoking history. He has never used  smokeless tobacco. He reports that he drinks about 3.5 oz of alcohol per week . He reports that he does not use drugs.  ROS: UROLOGY Frequent Urination?: No Hard to postpone urination?: No Burning/pain with urination?: No Get up at night to urinate?: Yes Leakage of urine?: No Urine stream starts and stops?: No Trouble starting stream?: No Do you have to strain to urinate?: No Blood in urine?: No Urinary tract infection?: No Sexually transmitted disease?: No Injury to kidneys or bladder?: No Painful intercourse?: No Weak stream?: No Erection problems?: No Penile pain?: No  Gastrointestinal Nausea?: No Vomiting?: No Indigestion/heartburn?: No Diarrhea?: No Constipation?: No  Constitutional Fever: No Night sweats?: No Weight loss?: No Fatigue?: No  Skin Skin rash/lesions?: No Itching?: No  Eyes Blurred vision?: No Double vision?: No  Ears/Nose/Throat Sore throat?: No Sinus problems?: No  Hematologic/Lymphatic Swollen glands?: No Easy bruising?: No  Cardiovascular Leg swelling?: No Chest pain?: No  Respiratory Cough?: No Shortness of breath?: No  Endocrine Excessive thirst?: No  Musculoskeletal Back pain?: No Joint pain?: No  Neurological Headaches?: No Dizziness?: No  Psychologic Depression?: No Anxiety?: No  Physical Exam: BP (!) 158/67   Pulse 63   Ht 5' 7.5" (1.715 m)   Wt 243 lb 8 oz (110.5 kg)   BMI 37.57 kg/m   Constitutional:  Alert and oriented, No acute distress. HEENT: [place] AT, moist mucus membranes.  Trachea midline, no masses. Cardiovascular: No clubbing, cyanosis, or edema. Respiratory: Normal respiratory effort, no increased work of breathing. GI: Abdomen is soft, nontender, nondistended, no abdominal masses GU: No CVA tenderness.  Skin: No rashes, bruises or suspicious lesions. Lymph: No cervical or inguinal adenopathy. Neurologic: Grossly intact, no focal deficits, moving all 4 extremities. Psychiatric: Normal mood  and affect.  Laboratory Data: Lab Results  Component Value Date   WBC 6.4 08/07/2016   HGB 12.6 (L) 08/07/2016   HCT 37.4 (L) 08/07/2016   MCV 94.6 08/07/2016   PLT 221 08/07/2016    Lab Results  Component Value Date   CREATININE 1.06 08/07/2016    Lab Results  Component Value Date   PSA 0.47 10/21/2015   PSA 0.32 08/26/2009   PSA 0.32 12/07/2007    No results found for: TESTOSTERONE  No results found for: HGBA1C  Urinalysis    Component Value Date/Time   COLORURINE lt. yellow 06/13/2007 1105   APPEARANCEUR Cloudy (A) 07/23/2016 1132   LABSPEC <1.005 06/13/2007 1105   PHURINE 6.0 06/13/2007 1105   GLUCOSEU Negative 07/23/2016 1132   HGBUR negative 06/13/2007 1105   BILIRUBINUR Negative 07/23/2016 1132   PROTEINUR Negative 07/23/2016 1132   UROBILINOGEN 0.2 06/01/2016 0953   UROBILINOGEN negative 06/13/2007 1105   NITRITE Negative 07/23/2016 1132   NITRITE negative 06/13/2007 1105   LEUKOCYTESUR Negative 07/23/2016 1132     Assessment &  Plan:    1. History of prostate cancer I discussed the patient how his PSA has risen from 0.4 to 1.9. We discussed biochemical recurrence and specifically the ASTRO criteria for determining this. Per the ASTRO criteria, biochemical recurrence is a PSA rising two points above the nadir. At this point, we discussed that his PSA is trending in that direction however he does not meet criteria for biochemical recurrence at this time. We will continue to monitor this closely. We will recheck his PSA in 3 months. If it does rise above 2.4, he will need a CT scan and bone scan as well as a discussion about starting Lupron.  2. BPH Continue Flomax  Return in about 3 months (around 01/15/2017) for PSA prior.  Nickie Retort, MD  [facility] [address], [place], [place] [address] [phone]

## 2016-10-16 ENCOUNTER — Encounter: Payer: Self-pay | Admitting: *Deleted

## 2016-10-19 NOTE — Discharge Instructions (Signed)
Cataract Surgery, Care After °Refer to this sheet in the next few weeks. These instructions provide you with information about caring for yourself after your procedure. Your health care provider may also give you more specific instructions. Your treatment has been planned according to current medical practices, but problems sometimes occur. Call your health care provider if you have any problems or questions after your procedure. °What can I expect after the procedure? °After the procedure, it is common to have: °· Itching. °· Discomfort. °· Fluid discharge. °· Sensitivity to light and to touch. °· Bruising. °Follow these instructions at home: °Eye Care  °· Check your eye every day for signs of infection. Watch for: °¨ Redness, swelling, or pain. °¨ Fluid, blood, or pus. °¨ Warmth. °¨ Bad smell. °Activity  °· Avoid strenuous activities, such as playing contact sports, for as long as told by your health care provider. °· Do not drive or operate heavy machinery until your health care provider approves. °· Do not bend or lift heavy objects . Bending increases pressure in the eye. You can walk, climb stairs, and do light household chores. °· Ask your health care provider when you can return to work. If you work in a dusty environment, you may be advised to wear protective eyewear for a period of time. °General instructions  °· Take or apply over-the-counter and prescription medicines only as told by your health care provider. This includes eye drops. °· Do not touch or rub your eyes. °· If you were given a protective shield, wear it as told by your health care provider. If you were not given a protective shield, wear sunglasses as told by your health care provider to protect your eyes. °· Keep the area around your eye clean and dry. Avoid swimming or allowing water to hit you directly in the face while showering until told by your health care provider. Keep soap and shampoo out of your eyes. °· Do not put a contact lens  into the affected eye or eyes until your health care provider approves. °· Keep all follow-up visits as told by your health care provider. This is important. °Contact a health care provider if: ° °· You have increased bruising around your eye. °· You have pain that is not helped with medicine. °· You have a fever. °· You have redness, swelling, or pain in your eye. °· You have fluid, blood, or pus coming from your incision. °· Your vision gets worse. °Get help right away if: °· You have sudden vision loss. °This information is not intended to replace advice given to you by your health care provider. Make sure you discuss any questions you have with your health care provider. °Document Released: 02/06/2005 Document Revised: 11/28/2015 Document Reviewed: 05/30/2015 °Elsevier Interactive Patient Education © 2017 [employer] ° ° ° ° °General Anesthesia, Adult, Care After °These instructions provide you with information about caring for yourself after your procedure. Your health care provider may also give you more specific instructions. Your treatment has been planned according to current medical practices, but problems sometimes occur. Call your health care provider if you have any problems or questions after your procedure. °What can I expect after the procedure? °After the procedure, it is common to have: °· Vomiting. °· A sore throat. °· Mental slowness. °It is common to feel: °· Nauseous. °· Cold or shivery. °· Sleepy. °· Tired. °· Sore or achy, even in parts of your body where you did not have surgery. °Follow these instructions at   home: °For at least 24 hours after the procedure:  °· Do not: °¨ Participate in activities where you could fall or become injured. °¨ Drive. °¨ Use heavy machinery. °¨ Drink alcohol. °¨ Take sleeping pills or medicines that cause drowsiness. °¨ Make important decisions or sign legal documents. °¨ Take care of children on your own. °· Rest. °Eating and drinking  °· If you vomit, drink  water, juice, or soup when you can drink without vomiting. °· Drink enough fluid to keep your urine clear or pale yellow. °· Make sure you have little or no nausea before eating solid foods. °· Follow the diet recommended by your health care provider. °General instructions  °· Have a responsible adult stay with you until you are awake and alert. °· Return to your normal activities as told by your health care provider. Ask your health care provider what activities are safe for you. °· Take over-the-counter and prescription medicines only as told by your health care provider. °· If you smoke, do not smoke without supervision. °· Keep all follow-up visits as told by your health care provider. This is important. °Contact a health care provider if: °· You continue to have nausea or vomiting at home, and medicines are not helpful. °· You cannot drink fluids or start eating again. °· You cannot urinate after 8-12 hours. °· You develop a skin rash. °· You have fever. °· You have increasing redness at the site of your procedure. °Get help right away if: °· You have difficulty breathing. °· You have chest pain. °· You have unexpected bleeding. °· You feel that you are having a life-threatening or urgent problem. °This information is not intended to replace advice given to you by your health care provider. Make sure you discuss any questions you have with your health care provider. °Document Released: 10/26/2000 Document Revised: 12/23/2015 Document Reviewed: 07/04/2015 °Elsevier Interactive Patient Education © 2017 [employer] ° °

## 2016-10-21 ENCOUNTER — Ambulatory Visit
Admission: RE | Admit: 2016-10-21 | Discharge: 2016-10-21 | Disposition: A | Discharge status: Home / Self Care | Payer: Medicare Other | Source: Ambulatory Visit | Attending: Ophthalmology | Admitting: Ophthalmology

## 2016-10-21 ENCOUNTER — Ambulatory Visit: Payer: Medicare Other | Admitting: Anesthesiology

## 2016-10-21 ENCOUNTER — Encounter
Admission: RE | Disposition: A | Discharge status: Home / Self Care | Payer: Self-pay | Source: Ambulatory Visit | Attending: Ophthalmology

## 2016-10-21 DIAGNOSIS — H2511 Age-related nuclear cataract, right eye: Secondary | ICD-10-CM | POA: Diagnosis not present

## 2016-10-21 DIAGNOSIS — Z87891 Personal history of nicotine dependence: Secondary | ICD-10-CM | POA: Insufficient documentation

## 2016-10-21 DIAGNOSIS — G4730 Sleep apnea, unspecified: Secondary | ICD-10-CM | POA: Insufficient documentation

## 2016-10-21 DIAGNOSIS — I10 Essential (primary) hypertension: Secondary | ICD-10-CM | POA: Insufficient documentation

## 2016-10-21 HISTORY — PX: CATARACT EXTRACTION W/PHACO: SHX586

## 2016-10-21 SURGERY — PHACOEMULSIFICATION, CATARACT, WITH IOL INSERTION
Anesthesia: Monitor Anesthesia Care | Laterality: Right | Wound class: Clean

## 2016-10-21 MED ORDER — LIDOCAINE HCL (PF) 2 % IJ SOLN
INTRAOCULAR | Status: DC | PRN
  Administered 2016-10-21: 1 mL via INTRAOCULAR

## 2016-10-21 MED ORDER — CEFUROXIME OPHTHALMIC INJECTION 1 MG/0.1 ML
INJECTION | OPHTHALMIC | Status: DC | PRN
  Administered 2016-10-21: 0.1 mL via OPHTHALMIC

## 2016-10-21 MED ORDER — MOXIFLOXACIN HCL 0.5 % OP SOLN
1.0000 [drp] | OPHTHALMIC | Status: DC | PRN
  Administered 2016-10-21 (×3): 1 [drp] via OPHTHALMIC

## 2016-10-21 MED ORDER — BRIMONIDINE TARTRATE-TIMOLOL 0.2-0.5 % OP SOLN
OPHTHALMIC | Status: DC | PRN
  Administered 2016-10-21: 1 [drp] via OPHTHALMIC

## 2016-10-21 MED ORDER — BSS IO SOLN
INTRAOCULAR | Status: DC | PRN
  Administered 2016-10-21: 49 mL via OPHTHALMIC

## 2016-10-21 MED ORDER — FENTANYL CITRATE (PF) 100 MCG/2ML IJ SOLN
INTRAMUSCULAR | Status: DC | PRN
  Administered 2016-10-21: 50 ug via INTRAVENOUS

## 2016-10-21 MED ORDER — NA HYALUR & NA CHOND-NA HYALUR 0.4-0.35 ML IO KIT
PACK | INTRAOCULAR | Status: DC | PRN
  Administered 2016-10-21: 1 mL via INTRAOCULAR

## 2016-10-21 MED ORDER — MIDAZOLAM HCL 2 MG/2ML IJ SOLN
INTRAMUSCULAR | Status: DC | PRN
  Administered 2016-10-21: 1 mg via INTRAVENOUS

## 2016-10-21 MED ORDER — ARMC OPHTHALMIC DILATING DROPS
1.0000 | OPHTHALMIC | Status: DC | PRN
Start: 2016-10-21 — End: 2016-10-21
  Administered 2016-10-21 (×3): 1 via OPHTHALMIC

## 2016-10-21 SURGICAL SUPPLY — 25 items
CANNULA ANT/CHMB 27GA (MISCELLANEOUS) ×3 IMPLANT
CARTRIDGE ABBOTT (MISCELLANEOUS) IMPLANT
GLOVE SURG LX 7.5 STRW (GLOVE) ×2
GLOVE SURG LX STRL 7.5 STRW (GLOVE) ×1 IMPLANT
GLOVE SURG TRIUMPH 8.0 PF LTX (GLOVE) ×3 IMPLANT
GOWN STRL REUS W/ TWL LRG LVL3 (GOWN DISPOSABLE) ×2 IMPLANT
GOWN STRL REUS W/TWL LRG LVL3 (GOWN DISPOSABLE) ×4
LENS IOL RESTOR 21.0 (Intraocular Lens) ×3 IMPLANT
MARKER SKIN DUAL TIP RULER LAB (MISCELLANEOUS) ×3 IMPLANT
NDL RETROBULBAR .5 NSTRL (NEEDLE) IMPLANT
NEEDLE FILTER BLUNT 18X 1/2SAF (NEEDLE) ×2
NEEDLE FILTER BLUNT 18X1 1/2 (NEEDLE) ×1 IMPLANT
PACK CATARACT BRASINGTON (MISCELLANEOUS) ×3 IMPLANT
PACK EYE AFTER SURG (MISCELLANEOUS) ×3 IMPLANT
PACK OPTHALMIC (MISCELLANEOUS) ×3 IMPLANT
RING MALYGIN 7.0 (MISCELLANEOUS) IMPLANT
SUT ETHILON 10-0 CS-B-6CS-B-6 (SUTURE)
SUT VICRYL  9 0 (SUTURE)
SUT VICRYL 9 0 (SUTURE) IMPLANT
SUTURE EHLN 10-0 CS-B-6CS-B-6 (SUTURE) IMPLANT
SYR 3ML LL SCALE MARK (SYRINGE) ×3 IMPLANT
SYR 5ML LL (SYRINGE) ×3 IMPLANT
SYR TB 1ML LUER SLIP (SYRINGE) ×3 IMPLANT
WATER STERILE IRR 250ML POUR (IV SOLUTION) ×3 IMPLANT
WIPE NON LINTING 3.25X3.25 (MISCELLANEOUS) ×3 IMPLANT

## 2016-10-21 NOTE — Transfer of Care (Signed)
Immediate Anesthesia Transfer of Care Note  Patient: Cesar Powell  Procedure(s) Performed: Procedure(s) with comments: CATARACT EXTRACTION PHACO AND INTRAOCULAR LENS PLACEMENT (IOC)  Right restor toric lens (Right) - restor toric lens  Patient Location: PACU  Anesthesia Type: MAC  Level of Consciousness: awake, alert  and patient cooperative  Airway and Oxygen Therapy: Patient Spontanous Breathing and Patient connected to supplemental oxygen  Post-op Assessment: Post-op Vital signs reviewed, Patient's Cardiovascular Status Stable, Respiratory Function Stable, Patent Airway and No signs of Nausea or vomiting  Post-op Vital Signs: Reviewed and stable  Complications: No apparent anesthesia complications

## 2016-10-21 NOTE — Anesthesia Procedure Notes (Signed)
Procedure Name: MAC Performed by: Tykiera Raven Pre-anesthesia Checklist: Patient identified, Emergency Drugs available, Suction available, Patient being monitored and Timeout performed Patient Re-evaluated:Patient Re-evaluated prior to inductionOxygen Delivery Method: Nasal cannula       

## 2016-10-21 NOTE — Anesthesia Postprocedure Evaluation (Signed)
Anesthesia Post Note  Patient: Cesar Powell  Procedure(s) Performed: Procedure(s) (LRB): CATARACT EXTRACTION PHACO AND INTRAOCULAR LENS PLACEMENT (IOC)  Right restor toric lens (Right)  Patient location during evaluation: PACU Anesthesia Type: MAC Level of consciousness: awake and alert and oriented Pain management: pain level controlled Vital Signs Assessment: post-procedure vital signs reviewed and stable Respiratory status: spontaneous breathing and nonlabored ventilation Cardiovascular status: stable Postop Assessment: no signs of nausea or vomiting and adequate PO intake Anesthetic complications: no    Estill Batten

## 2016-10-21 NOTE — Anesthesia Preprocedure Evaluation (Signed)
Anesthesia Evaluation  Patient identified by MRN, date of birth, ID band  Reviewed: Allergy & Precautions, H&P , NPO status , Patient's Chart, lab work & pertinent test results  Airway Mallampati: II  TM Distance: >3 FB Neck ROM: full    Dental no notable dental hx.    Pulmonary sleep apnea , former smoker,    Pulmonary exam normal        Cardiovascular hypertension,  Rhythm:regular Rate:Normal     Neuro/Psych    GI/Hepatic   Endo/Other    Renal/GU      Musculoskeletal   Abdominal   Peds  Hematology   Anesthesia Other Findings   Reproductive/Obstetrics                             Anesthesia Physical  Anesthesia Plan  ASA: II  Anesthesia Plan: MAC   Post-op Pain Management:    Induction:   Airway Management Planned:   Additional Equipment:   Intra-op Plan:   Post-operative Plan:   Informed Consent: I have reviewed the patients History and Physical, chart, labs and discussed the procedure including the risks, benefits and alternatives for the proposed anesthesia with the patient or authorized representative who has indicated his/her understanding and acceptance.     Plan Discussed with: CRNA  Anesthesia Plan Comments:         Anesthesia Quick Evaluation

## 2016-10-21 NOTE — Op Note (Signed)
LOCATION:  [place]   PREOPERATIVE DIAGNOSIS:  Nuclear sclerotic cataract of the right eye.  H25.11   POSTOPERATIVE DIAGNOSIS:  Nuclear sclerotic cataract of the right eye.   PROCEDURE:  Phacoemulsification with Toric posterior chamber intraocular lens placement of the right eye.   LENS:   Implant Name Type Inv. Item Serial No. Manufacturer Lot No. LRB No. Used  Acrysof IQ restor lens      [id number] ALCON   Right 1     [id number] 21.0 D Toric intraocular lens with 2.25 diopters of cylindrical power with axis orientation at 90 degrees.   ULTRASOUND TIME: 22 % of 0 minutes, 56 seconds.  CDE 12.0   SURGEON:  Wyonia Hough, MD   ANESTHESIA: Topical with tetracaine drops and 2% Xylocaine jelly, augmented with 1% preservative-free intracameral lidocaine. .   COMPLICATIONS:  None.   DESCRIPTION OF PROCEDURE:  The patient was identified in the holding room and transported to the operating suite and placed in the supine position under the operating microscope.  The right eye was identified as the operative eye, and it was prepped and draped in the usual sterile ophthalmic fashion.    A clear-corneal paracentesis incision was made at the 12:00 position.  0.5 ml of preservative-free 1% lidocaine was injected into the anterior chamber. The anterior chamber was filled with Viscoat.  A 2.4 millimeter near clear corneal incision was then made at the 9:00 position.  A cystotome and capsulorrhexis forceps were then used to make a curvilinear capsulorrhexis.  Hydrodissection and hydrodelineation were then performed using balanced salt solution.   Phacoemulsification was then used in stop and chop fashion to remove the lens, nucleus and epinucleus.  The remaining cortex was aspirated using the irrigation and aspiration handpiece.  Provisc viscoelastic was then placed into the capsular bag to distend it for lens placement.  The Verion digital marker was used to align the implant at the  intended axis.   A Toric lens was then injected into the capsular bag.  It was rotated clockwise until the axis marks on the lens were approximately 15 degrees in the counterclockwise direction to the intended alignment.  The viscoelastic was aspirated from the eye using the irrigation aspiration handpiece.  Then, a Koch spatula through the sideport incision was used to rotate the lens in a clockwise direction until the axis markings of the intraocular lens were lined up with the Verion alignment.  Balanced salt solution was then used to hydrate the wounds. Cefuroxime 0.1 ml of a 10mg /ml solution was injected into the anterior chamber for a dose of 1 mg of intracameral antibiotic at the completion of the case.    The eye was noted to have a physiologic pressure and there was no wound leak noted.   Timolol and Brimonidine drops were applied to the eye.  The patient was taken to the recovery room in stable condition having had no complications of anesthesia or surgery.  Royetta Probus 10/21/2016, 10:15 AM

## 2016-10-21 NOTE — H&P (Signed)
The History and Physical notes are on paper, have been signed, and are to be scanned. The patient remains stable and unchanged from the H&P.   Previous H&P reviewed, patient examined, and there are no changes.  Cesar Powell 10/21/2016 9:19 AM

## 2016-11-10 DIAGNOSIS — M5416 Radiculopathy, lumbar region: Secondary | ICD-10-CM | POA: Diagnosis not present

## 2016-11-10 DIAGNOSIS — M9905 Segmental and somatic dysfunction of pelvic region: Secondary | ICD-10-CM | POA: Diagnosis not present

## 2016-11-10 DIAGNOSIS — M955 Acquired deformity of pelvis: Secondary | ICD-10-CM | POA: Diagnosis not present

## 2016-11-10 DIAGNOSIS — M9903 Segmental and somatic dysfunction of lumbar region: Secondary | ICD-10-CM | POA: Diagnosis not present

## 2016-12-07 DIAGNOSIS — M955 Acquired deformity of pelvis: Secondary | ICD-10-CM | POA: Diagnosis not present

## 2016-12-07 DIAGNOSIS — M5416 Radiculopathy, lumbar region: Secondary | ICD-10-CM | POA: Diagnosis not present

## 2016-12-07 DIAGNOSIS — M9905 Segmental and somatic dysfunction of pelvic region: Secondary | ICD-10-CM | POA: Diagnosis not present

## 2016-12-07 DIAGNOSIS — M9903 Segmental and somatic dysfunction of lumbar region: Secondary | ICD-10-CM | POA: Diagnosis not present

## 2017-01-05 DIAGNOSIS — M9903 Segmental and somatic dysfunction of lumbar region: Secondary | ICD-10-CM | POA: Diagnosis not present

## 2017-01-05 DIAGNOSIS — M5416 Radiculopathy, lumbar region: Secondary | ICD-10-CM | POA: Diagnosis not present

## 2017-01-05 DIAGNOSIS — M955 Acquired deformity of pelvis: Secondary | ICD-10-CM | POA: Diagnosis not present

## 2017-01-05 DIAGNOSIS — M9905 Segmental and somatic dysfunction of pelvic region: Secondary | ICD-10-CM | POA: Diagnosis not present

## 2017-01-12 ENCOUNTER — Other Ambulatory Visit: Payer: Medicare Other

## 2017-01-12 DIAGNOSIS — C61 Malignant neoplasm of prostate: Secondary | ICD-10-CM | POA: Diagnosis not present

## 2017-01-13 LAB — PSA: PROSTATE SPECIFIC AG, SERUM: 3.6 ng/mL (ref 0.0–4.0)

## 2017-01-14 ENCOUNTER — Ambulatory Visit (INDEPENDENT_AMBULATORY_CARE_PROVIDER_SITE_OTHER): Payer: Medicare Other | Admitting: Urology

## 2017-01-14 ENCOUNTER — Encounter: Payer: Self-pay | Admitting: Urology

## 2017-01-14 VITALS — BP 165/66 | HR 71 | Ht 68.0 in | Wt 238.0 lb

## 2017-01-14 DIAGNOSIS — C61 Malignant neoplasm of prostate: Secondary | ICD-10-CM

## 2017-01-14 DIAGNOSIS — R350 Frequency of micturition: Secondary | ICD-10-CM

## 2017-01-14 DIAGNOSIS — N401 Enlarged prostate with lower urinary tract symptoms: Secondary | ICD-10-CM | POA: Diagnosis not present

## 2017-01-14 MED ORDER — TAMSULOSIN HCL 0.4 MG PO CAPS: 0.8000 mg | ORAL_CAPSULE | Freq: Every day | ORAL | 11 refills | Status: DC

## 2017-01-14 MED ORDER — LEUPROLIDE ACETATE (3 MONTH) 22.5 MG IM KIT
22.5000 mg | PACK | Freq: Once | INTRAMUSCULAR | Status: AC
  Administered 2017-01-14: 22.5 mg via INTRAMUSCULAR

## 2017-01-14 NOTE — Progress Notes (Signed)
01/14/2017 10:11 AM   Cesar Powell May 23, 1931 [id number]  Referring provider: Owens Loffler, MD [address] [place], [place] [address]  Chief Complaint  Patient presents with  . Prostate Cancer    7month   HPI: The patient is an 84year old gentleman with a past medical history of prostate cancer treated with radiation who presents for follow up.  1. Gross hematuria Patient with gross hematuria for 18 months. Work up revealed bladder stone which was recently treated with cystolitholapaxy.  2. History of prostate cancer Treated with radiation in 2005. After radiation his PSA ranat approximately 0.03 for a number of years. He was then started on Axironfor low testosterone. His PSA then started to rise eventually to as high as 18. This was 2 years ago. His Axiron was stopped at this point and he was given 1 dose of Lupron. His PSA then fell and has been at 0.4 for the last year. He was last checked in March 2017. His PSA rose to 1.9 in March 2018. It is now 3.6 in June 2018. The plan in March 2018 was to begin Lupron and obtain a CT and bone scan once he met asked her criteria with a PSA rise 2 above his nadir which for him is 2.4. He reports no new back or pelvic pain.  3. BPH Currently on Flomax. Prior to starting Flomax, he had severe urinary urgency with urge incontinence. At that time, his PVR was 219 cc. After starting Flomax, his symptoms greatly improved. IPSS today is 11/2. Biggest complaint is frequency. He also feels that he doesn't always empty his bladder. His stream is somewhat weak. He is interested in trying additional medication if available.  4. Bladder stone The patient underwent cystoscopy litholapaxy in January 2018. He did not undergo a TURP at that time because he was not on medication when the bladder stone formed and once medication started his PVR was low and his symptoms resolved. His bladder stone was 35% calcium oxalate dihydrate, 30%  calcium oxalate monohydrate, and 35% calcium phosphate.     PMH: Past Medical History:  Diagnosis Date  . Discoloration of nail   . H/O prostate cancer    was treated with radiation  . Hypertension   . Local recurrence of prostate cancer [place] 06/[place]   Qualifier: Diagnosis of  By: FFuller PlanCMA (AAMA), [place]    . Shingles     Surgical History: Past Surgical History:  Procedure Laterality Date  . ABDOMINAL SURGERY     ruptured intestines   . CATARACT EXTRACTION W/PHACO Left 01/29/2016   Procedure: CATARACT EXTRACTION PHACO AND INTRAOCULAR LENS PLACEMENT [place];  Surgeon: CLeandrew Koyanagi MD;  [place]  Service: Ophthalmology;  Laterality: Left;  RESTOR LENS  . CATARACT EXTRACTION W/PHACO Right 10/21/2016   Procedure: CATARACT EXTRACTION PHACO AND INTRAOCULAR LENS PLACEMENT (IOC)  Right restor toric lens;  Surgeon: CLeandrew Koyanagi MD;  [place]  Service: Ophthalmology;  Laterality: Right;  restor toric lens  . CYSTOSCOPY WITH LITHOLAPAXY N/A 08/14/2016   Procedure: CYSTOSCOPY WITH LITHOLAPAXY;  Surgeon: BNickie Retort MD;  Location: [facility];  Service: Urology;  Laterality: N/A;  . FEMUR IM NAIL Right 10/08/2014   Procedure: INTRAMEDULLARY (IM) NAIL FEMORAL;  Surgeon: BRod Can MD;  [place]  Service: Orthopedics;  Laterality: Right;  PER DANIELLE 110 MIN  . HOLMIUM LASER APPLICATION  11/91/4782  Procedure: HOLMIUM LASER APPLICATION;  Surgeon: BNickie Retort  MD;  Location: [facility];  Service: Urology;;  . OTHER SURGICAL HISTORY  2006   Prostate Radiation Surgery     Home Medications:  Allergies as of 01/14/2017   No Known Allergies     Medication List       Accurate as of 01/14/17 10:11 AM. Always use your most recent med list.          cholecalciferol 1000 units tablet Commonly known as:  VITAMIN D Take 1,000 Units by mouth daily.   Fish Oil 1200 MG Caps Take 1,200 mg by mouth daily.     GLUCOSAMINE CHONDR COMPLEX PO Take 2 capsules by mouth daily.   multivitamin capsule Take 1 capsule by mouth daily.   tamsulosin 0.4 MG Caps capsule Commonly known as:  FLOMAX Take 1 capsule (0.4 mg total) by mouth daily.   tamsulosin 0.4 MG Caps capsule Commonly known as:  FLOMAX Take 2 capsules (0.8 mg total) by mouth daily.   vitamin C 1000 MG tablet Take 1,000 mg by mouth daily.       Allergies: No Known Allergies  Family History: Family History  Problem Relation Age of Onset  . Heart disease Brother   . Heart attack Brother   . Prostate cancer Neg Hx   . Bladder Cancer Neg Hx   . Kidney cancer Neg Hx     Social History:  reports that he quit smoking about 51 years ago. His smoking use included Cigarettes. He has a 15.00 pack-year smoking history. He has never used smokeless tobacco. He reports that he drinks about 3.5 oz of alcohol per week . He reports that he does not use drugs.  ROS: UROLOGY Frequent Urination?: Yes Hard to postpone urination?: Yes Burning/pain with urination?: No Get up at night to urinate?: No Leakage of urine?: Yes Urine stream starts and stops?: No Trouble starting stream?: No Do you have to strain to urinate?: No Blood in urine?: No Urinary tract infection?: No Sexually transmitted disease?: No Injury to kidneys or bladder?: No Painful intercourse?: No Weak stream?: No Erection problems?: No Penile pain?: No  Gastrointestinal Nausea?: No Vomiting?: No Indigestion/heartburn?: No Diarrhea?: No Constipation?: No  Constitutional Fever: No Night sweats?: No Weight loss?: No Fatigue?: No  Skin Skin rash/lesions?: No Itching?: No  Eyes Blurred vision?: No Double vision?: No  Ears/Nose/Throat Sore throat?: No Sinus problems?: No  Hematologic/Lymphatic Swollen glands?: No Easy bruising?: No  Cardiovascular Leg swelling?: No Chest pain?: No  Respiratory Cough?: No Shortness of breath?:  No  Endocrine Excessive thirst?: No  Musculoskeletal Back pain?: No Joint pain?: No  Neurological Headaches?: No Dizziness?: No  Psychologic Depression?: No Anxiety?: No  Physical Exam: BP (!) 165/66   Pulse 71   Ht _0  (1.727 m)   Wt 238 lb (108 kg)   BMI 36.19 kg/m   Constitutional:  Alert and oriented, No acute distress. HEENT: [place] AT, moist mucus membranes.  Trachea midline, no masses. Cardiovascular: No clubbing, cyanosis, or edema. Respiratory: Normal respiratory effort, no increased work of breathing. GI: Abdomen is soft, nontender, nondistended, no abdominal masses GU: No CVA tenderness.  Skin: No rashes, bruises or suspicious lesions. Lymph: No cervical or inguinal adenopathy. Neurologic: Grossly intact, no focal deficits, moving all 4 extremities. Psychiatric: Normal mood and affect.  Laboratory Data: Lab Results  Component Value Date   WBC 6.4 08/07/2016   HGB 12.6 (L) 08/07/2016   HCT 37.4 (L) 08/07/2016   MCV 94.6 08/07/2016   PLT 221 08/07/2016  Lab Results  Component Value Date   CREATININE 1.06 08/07/2016    Lab Results  Component Value Date   PSA 0.47 10/21/2015   PSA 0.32 08/26/2009   PSA 0.32 12/07/2007    No results found for: TESTOSTERONE  No results found for: HGBA1C  Urinalysis    Component Value Date/Time   COLORURINE lt. yellow 06/13/2007 1105   APPEARANCEUR Cloudy (A) 07/23/2016 1132   LABSPEC <1.005 06/13/2007 1105   PHURINE 6.0 06/13/2007 1105   GLUCOSEU Negative 07/23/2016 1132   HGBUR negative 06/13/2007 1105   BILIRUBINUR Negative 07/23/2016 1132   PROTEINUR Negative 07/23/2016 1132   UROBILINOGEN 0.2 06/01/2016 0953   UROBILINOGEN negative 06/13/2007 1105   NITRITE Negative 07/23/2016 1132   NITRITE negative 06/13/2007 1105   LEUKOCYTESUR Negative 07/23/2016 1132      Assessment & Plan:    1. Biochemical recurrence of prostate cancer I discussed the patient how his PSA has risen from 0.4 to 1.9 to  3.6. It infected and was doubled in 3 months. We discussed that he now meet fascial criteria for biochemical recurrence. He was given a 3 month injection of Lupron today for his biochemical recurrence of prostate cancer. We will obtain a CT of his abdomen and pelvis and bone scan. If he has no evidence of metastatic disease, he may benefit from referral to a facility that performs cryotherapy of the prostate.  The patient was on Lupron. Since before surgery is similar with it. He understands the risks, benefits, indications. He understands the risks include osteoporosis, excuse strength and muscle mass, decreased libido, and fatigue among others. He was instructed to continue his calcium and vitamin D which he already takes. He is on this long-term, he may benefit from [place]. He will need a PSA and testosterone in 3 months. We will have him follow-up though sooner to go over CT and bone scan results.  2. BPH Continue Flomax  Return for after CT and bone scan.  Nickie Retort, MD  [facility] [address], [place], [place] [address] [phone]

## 2017-01-14 NOTE — Progress Notes (Signed)
Lupron IM Injection   Due to Prostate Cancer patient is present today for a Lupron Injection.  Medication: Lupron 3 month Dose: 22.5 mg  Location: right upper outer buttocks Lot: [id number] Exp: 04/03/2019  Patient tolerated well, no complications were noted.  Performed by: Lyndee Hensen CMA  Follow up: 3 month for next lupron

## 2017-02-02 ENCOUNTER — Ambulatory Visit
Admission: RE | Admit: 2017-02-02 | Discharge: 2017-02-02 | Disposition: A | Discharge status: Home / Self Care | Payer: Medicare Other | Source: Ambulatory Visit | Attending: Urology | Admitting: Urology

## 2017-02-02 ENCOUNTER — Encounter
Admission: RE | Admit: 2017-02-02 | Discharge: 2017-02-02 | Disposition: A | Discharge status: Home / Self Care | Payer: Medicare Other | Source: Ambulatory Visit | Attending: Urology | Admitting: Urology

## 2017-02-02 ENCOUNTER — Encounter: Admission: RE | Admit: 2017-02-02 | Payer: Medicare Other | Source: Ambulatory Visit

## 2017-02-02 DIAGNOSIS — C61 Malignant neoplasm of prostate: Secondary | ICD-10-CM

## 2017-02-02 DIAGNOSIS — M955 Acquired deformity of pelvis: Secondary | ICD-10-CM | POA: Diagnosis not present

## 2017-02-02 DIAGNOSIS — N210 Calculus in bladder: Secondary | ICD-10-CM | POA: Insufficient documentation

## 2017-02-02 DIAGNOSIS — I700 Atherosclerosis of aorta: Secondary | ICD-10-CM | POA: Diagnosis not present

## 2017-02-02 DIAGNOSIS — M9903 Segmental and somatic dysfunction of lumbar region: Secondary | ICD-10-CM | POA: Diagnosis not present

## 2017-02-02 DIAGNOSIS — K760 Fatty (change of) liver, not elsewhere classified: Secondary | ICD-10-CM | POA: Diagnosis not present

## 2017-02-02 DIAGNOSIS — M9905 Segmental and somatic dysfunction of pelvic region: Secondary | ICD-10-CM | POA: Diagnosis not present

## 2017-02-02 DIAGNOSIS — M5416 Radiculopathy, lumbar region: Secondary | ICD-10-CM | POA: Diagnosis not present

## 2017-02-02 LAB — POCT I-STAT CREATININE: CREATININE: 1 mg/dL (ref 0.61–1.24)

## 2017-02-02 MED ORDER — IOPAMIDOL (ISOVUE-300) INJECTION 61%
100.0000 mL | Freq: Once | INTRAVENOUS | Status: AC | PRN
  Administered 2017-02-02: 100 mL via INTRAVENOUS

## 2017-02-02 MED ORDER — TECHNETIUM TC 99M MEDRONATE IV KIT
25.0000 | PACK | Freq: Once | INTRAVENOUS | Status: AC | PRN
  Administered 2017-02-02: 22.47 via INTRAVENOUS

## 2017-02-11 ENCOUNTER — Ambulatory Visit (INDEPENDENT_AMBULATORY_CARE_PROVIDER_SITE_OTHER): Payer: Medicare Other | Admitting: Urology

## 2017-02-11 ENCOUNTER — Encounter: Payer: Self-pay | Admitting: Urology

## 2017-02-11 VITALS — BP 176/52 | HR 79 | Ht 68.0 in | Wt 248.5 lb

## 2017-02-11 DIAGNOSIS — C61 Malignant neoplasm of prostate: Secondary | ICD-10-CM | POA: Diagnosis not present

## 2017-02-11 NOTE — Progress Notes (Signed)
02/11/2017 4:38 PM   [name] 07-15-1931 [id number]  Referring provider: Owens Loffler, MD [address] [place], [place] [address]  Chief Complaint  Patient presents with  . Prostate Cancer    HPI: The patient is an 83 year old gentleman with a past medical history of prostate cancer treated with radiation who presents for follow up.  1. Gross hematuria Patient with gross hematuria for 18 months. Work up revealed bladder stone which was recently treated with cystolitholapaxy.  2. History of prostate cancer Treated with radiation in 2005. After radiation his PSA ranat approximately 0.03 for a number of years. He was then started on Axironfor low testosterone. His PSA then started to rise eventually to ashigh as 18. This was 2 years ago. His Axiron was stopped at this point and he was given 1 dose of Lupron. His PSA then fell and has been at 0.4 for the last year. He was last checked in March 2017. His PSA rose to 1.9 in March 2018. It was 3.6 in June 2018. He was given his first 3 month Lupron injection on January 14, 2017. He underwent CT and bone scan which showed no evidence of metastatic disease.  3. BPH Currently on Flomax. Prior to starting Flomax, he had severe urinary urgency with urge incontinence. At that time, his PVR was 219 cc. After starting Flomax, his symptoms greatly improved. Last IPSS today was 11/2.   4. Bladder stone The patient underwent cystoscopy litholapaxy in January 2018. He did not undergo a TURP at that time because he was not on medication when the bladder stone formed and once medication started his PVR was low and his symptoms resolved. His bladder stone was 35% calcium oxalate dihydrate, 30% calcium oxalate monohydrate, and 35% calcium phosphate.       PMH: Past Medical History:  Diagnosis Date  . Discoloration of nail   . H/O prostate cancer    was treated with radiation  . Hypertension   . Local recurrence of prostate  cancer ([place]) 06/22/2007   Qualifier: Diagnosis of  By: Fuller Plan CMA (AAMA), Lug[place]    . Shingles     Surgical History: Past Surgical History:  Procedure Laterality Date  . ABDOMINAL SURGERY     ruptured intestines   . CATARACT EXTRACTION W/PHACO Left 01/29/2016   Procedure: CATARACT EXTRACTION PHACO AND INTRAOCULAR LENS PLACEMENT ([place]) left eye;  Surgeon: Leandrew Koyanagi, MD;  Location: [facility];  Service: Ophthalmology;  Laterality: Left;  RESTOR LENS  . CATARACT EXTRACTION W/PHACO Right 10/21/2016   Procedure: CATARACT EXTRACTION PHACO AND INTRAOCULAR LENS PLACEMENT ([facility])  Right restor toric lens;  Surgeon: Leandrew Koyanagi, MD;  Location: [facility];  Service: Ophthalmology;  Laterality: Right;  restor toric lens  . CYSTOSCOPY WITH LITHOLAPAXY N/A 08/14/2016   Procedure: CYSTOSCOPY WITH LITHOLAPAXY;  Surgeon: Nickie Retort, MD;  Location: [facility];  Service: Urology;  Laterality: N/A;  . FEMUR IM NAIL Right 10/08/2014   Procedure: INTRAMEDULLARY (IM) NAIL FEMORAL;  Surgeon: Rod Can, MD;  Location: [facility];  Service: Orthopedics;  Laterality: Right;  PER DANIELLE 110 MIN  . HOLMIUM LASER APPLICATION  3/97/6734   Procedure: HOLMIUM LASER APPLICATION;  Surgeon: Nickie Retort, MD;  Location: [facility];  Service: Urology;;  . OTHER SURGICAL HISTORY  2006   Prostate Radiation Surgery     Home Medications:  Allergies as of 02/11/2017   No Known Allergies     Medication List  Accurate as of 02/11/17  4:38 PM. Always use your most recent med list.          cholecalciferol 1000 units tablet Commonly known as:  VITAMIN D Take 1,000 Units by mouth daily.   Fish Oil 1200 MG Caps Take 1,200 mg by mouth daily.   GLUCOSAMINE CHONDR COMPLEX PO Take 2 capsules by mouth daily.   multivitamin capsule Take 1 capsule by mouth daily.   tamsulosin 0.4 MG Caps capsule Commonly known as:  FLOMAX Take 2 capsules (0.8 mg total) by mouth daily.     vitamin C 1000 MG tablet Take 1,000 mg by mouth daily.       Allergies: No Known Allergies  Family History: Family History  Problem Relation Age of Onset  . Heart disease Brother   . Heart attack Brother   . Prostate cancer Neg Hx   . Bladder Cancer Neg Hx   . Kidney cancer Neg Hx     Social History:  reports that he quit smoking about 51 years ago. His smoking use included Cigarettes. He has a 15.00 pack-year smoking history. He has never used smokeless tobacco. He reports that he drinks about 3.5 oz of alcohol per week . He reports that he does not use drugs.  ROS: UROLOGY Frequent Urination?: Yes Hard to postpone urination?: Yes Burning/pain with urination?: No Get up at night to urinate?: Yes Leakage of urine?: Yes Urine stream starts and stops?: No Trouble starting stream?: No Do you have to strain to urinate?: No Blood in urine?: No Urinary tract infection?: No Sexually transmitted disease?: No Injury to kidneys or bladder?: No Painful intercourse?: No Weak stream?: No Erection problems?: No Penile pain?: No  Gastrointestinal Nausea?: No Vomiting?: No Indigestion/heartburn?: No Diarrhea?: No Constipation?: No  Constitutional Fever: No Night sweats?: No Weight loss?: No Fatigue?: No  Skin Skin rash/lesions?: No Itching?: No  Eyes Blurred vision?: No Double vision?: No  Ears/Nose/Throat Sore throat?: No Sinus problems?: No  Hematologic/Lymphatic Swollen glands?: No Easy bruising?: No  Cardiovascular Leg swelling?: No Chest pain?: No  Respiratory Cough?: No Shortness of breath?: No  Endocrine Excessive thirst?: No  Musculoskeletal Back pain?: No Joint pain?: No  Neurological Headaches?: No Dizziness?: No  Psychologic Depression?: No Anxiety?: No  Physical Exam: BP (!) 176/52 (BP Location: Left Arm, Patient Position: Sitting, Cuff Size: Normal)   Pulse 79   Ht 5\' 8"  (1.727 m)   Wt 248 lb 8 oz (112.7 kg)   BMI 37.78  kg/m   Constitutional:  Alert and oriented, No acute distress. HEENT: [place] AT, moist mucus membranes.  Trachea midline, no masses. Cardiovascular: No clubbing, cyanosis, or edema. Respiratory: Normal respiratory effort, no increased work of breathing. GI: Abdomen is soft, nontender, nondistended, no abdominal masses GU: No CVA tenderness.  Skin: No rashes, bruises or suspicious lesions. Lymph: No cervical or inguinal adenopathy. Neurologic: Grossly intact, no focal deficits, moving all 4 extremities. Psychiatric: Normal mood and affect.  Laboratory Data: Lab Results  Component Value Date   WBC 6.4 08/07/2016   HGB 12.6 (L) 08/07/2016   HCT 37.4 (L) 08/07/2016   MCV 94.6 08/07/2016   PLT 221 08/07/2016    Lab Results  Component Value Date   CREATININE 1.00 02/02/2017    Lab Results  Component Value Date   PSA 0.47 10/21/2015   PSA 0.32 08/26/2009   PSA 0.32 12/07/2007    No results found for: TESTOSTERONE  No results found for: HGBA1C  Urinalysis  Component Value Date/Time   COLORURINE lt. yellow 06/13/2007 1105   APPEARANCEUR Cloudy (A) 07/23/2016 1132   LABSPEC <1.005 06/13/2007 1105   PHURINE 6.0 06/13/2007 1105   GLUCOSEU Negative 07/23/2016 1132   HGBUR negative 06/13/2007 1105   BILIRUBINUR Negative 07/23/2016 1132   PROTEINUR Negative 07/23/2016 1132   UROBILINOGEN 0.2 06/01/2016 0953   UROBILINOGEN negative 06/13/2007 1105   NITRITE Negative 07/23/2016 1132   NITRITE negative 06/13/2007 1105   LEUKOCYTESUR Negative 07/23/2016 1132    Pertinent Imaging: CT abdomen and pelvis and bone scan reviewed without evidence of metastatic disease.  Assessment & Plan:    1. Biochemical recurrence of prostate cancer I discussed the patient how his PSA has risen from 0.4 to 1.9 to 3.6. Fortunately has no evidence of metastatic disease. He will be due for repeat Lupron in mid September 2018. We will check a PSA and testosterone prior. He has had elevation of his  PSA after radiation therapy in the past when he was taking exogenous testosterone that responded well to androgen deprivation therapy. He then went many years without elevation in his PSA. We will continue him on ADT for the time being. However, he may be a candidate for intermittent androgen deprivation therapy down the road given this history. He will continue his calcium and vitamin D.  2. BPH Continue Flomax  Return for PSA/T prior after Sept 15, 2018 for lupron.  Nickie Retort, MD  [facility] [address], [place], [place] [address] [phone]

## 2017-03-02 DIAGNOSIS — M9903 Segmental and somatic dysfunction of lumbar region: Secondary | ICD-10-CM | POA: Diagnosis not present

## 2017-03-02 DIAGNOSIS — M9905 Segmental and somatic dysfunction of pelvic region: Secondary | ICD-10-CM | POA: Diagnosis not present

## 2017-03-02 DIAGNOSIS — M5416 Radiculopathy, lumbar region: Secondary | ICD-10-CM | POA: Diagnosis not present

## 2017-03-02 DIAGNOSIS — M955 Acquired deformity of pelvis: Secondary | ICD-10-CM | POA: Diagnosis not present

## 2017-03-25 ENCOUNTER — Telehealth: Payer: Self-pay | Admitting: Family Medicine

## 2017-03-25 NOTE — Telephone Encounter (Signed)
Left pt message asking to call Ebony Hail back directly at [phone] to schedule AWV + labs with Katha Cabal and CPE with PCP.  *NOTE* Last AWV 10/21/15

## 2017-03-31 NOTE — Telephone Encounter (Signed)
Pt is in [place] right now and will call back to schedule AWV

## 2017-04-19 ENCOUNTER — Other Ambulatory Visit: Payer: Medicare Other

## 2017-04-19 NOTE — Telephone Encounter (Signed)
Still in [place]. Wife took bad fall and had to have surgery. Will call back to schedule when he returns.

## 2017-04-20 DIAGNOSIS — L821 Other seborrheic keratosis: Secondary | ICD-10-CM | POA: Diagnosis not present

## 2017-04-20 DIAGNOSIS — L570 Actinic keratosis: Secondary | ICD-10-CM | POA: Diagnosis not present

## 2017-04-22 ENCOUNTER — Ambulatory Visit: Payer: Medicare Other

## 2017-05-11 DIAGNOSIS — M5416 Radiculopathy, lumbar region: Secondary | ICD-10-CM | POA: Diagnosis not present

## 2017-05-11 DIAGNOSIS — M955 Acquired deformity of pelvis: Secondary | ICD-10-CM | POA: Diagnosis not present

## 2017-05-11 DIAGNOSIS — M9903 Segmental and somatic dysfunction of lumbar region: Secondary | ICD-10-CM | POA: Diagnosis not present

## 2017-05-11 DIAGNOSIS — M9905 Segmental and somatic dysfunction of pelvic region: Secondary | ICD-10-CM | POA: Diagnosis not present

## 2017-05-21 ENCOUNTER — Other Ambulatory Visit: Payer: Medicare Other

## 2017-05-21 DIAGNOSIS — C61 Malignant neoplasm of prostate: Secondary | ICD-10-CM | POA: Diagnosis not present

## 2017-05-22 LAB — PSA: Prostate Specific Ag, Serum: 1.7 ng/mL (ref 0.0–4.0)

## 2017-05-22 LAB — TESTOSTERONE

## 2017-05-28 ENCOUNTER — Ambulatory Visit (INDEPENDENT_AMBULATORY_CARE_PROVIDER_SITE_OTHER): Payer: Medicare Other | Admitting: Urology

## 2017-05-28 ENCOUNTER — Encounter: Payer: Self-pay | Admitting: Urology

## 2017-05-28 VITALS — BP 160/70 | HR 75 | Ht 68.0 in | Wt 242.8 lb

## 2017-05-28 DIAGNOSIS — C61 Malignant neoplasm of prostate: Secondary | ICD-10-CM | POA: Diagnosis not present

## 2017-05-28 DIAGNOSIS — N400 Enlarged prostate without lower urinary tract symptoms: Secondary | ICD-10-CM

## 2017-05-28 MED ORDER — LEUPROLIDE ACETATE (3 MONTH) 22.5 MG IM KIT
22.5000 mg | PACK | Freq: Once | INTRAMUSCULAR | Status: AC
  Administered 2017-05-28: 22.5 mg via INTRAMUSCULAR

## 2017-05-28 NOTE — Progress Notes (Addendum)
Lupron IM Injection   Due to Prostate Cancer patient is present today for a Lupron Injection.  Medication: Lupron 3 month Dose: 22.5 mg  Location: right upper outer buttocks Lot: [id number] Exp: 09/21/2019  Patient tolerated well, no complications were noted  Performed by: C. Corinna Capra, CMA    Follow up: 3 month

## 2017-05-28 NOTE — Addendum Note (Signed)
Addended by: Leia Alf on: 05/28/2017 02:56 PM   Modules accepted: Orders

## 2017-05-28 NOTE — Progress Notes (Signed)
05/28/2017 2:30 PM   Cesar Powell July 03, 1931 [id number]  Referring provider: Owens Loffler, MD [address] [place], [place] [address]  Chief Complaint  Patient presents with  . Prostate Cancer    HPI: The patient is an 83 year old gentleman with a past medical history of prostate cancer treated with radiation who presents for follow up.   1. Biochemical recurrence of prostate cancer Treated with radiation in 2005. After radiation his PSA ranat approximately 0.03 for a number of years. He was then started on Axironfor low testosterone. His PSA then started to rise eventually to ashigh as 18. His Axiron was stopped at this point and he was given 1 dose of Lupron. His PSA then fell and has been at 0.4 for the last year. It was last checked in March 2017. His PSA rose to 1.9 in March 2018. It was 3.6 in June 2018. He was given his first 3 month Lupron injection on January 14, 2017. He underwent CT and bone scan which showed no evidence of metastatic disease. PSA decreased to 1.7 in October 2018. Testosterone < 3.  1. BPH Currently on Flomax. Prior to starting Flomax, he had severe urinary urgency with urge incontinence. At that time, his PVR was 219 cc. After starting Flomax, his symptoms greatly improved. Last IPSS was 11/2.   2. Gross hematuria Patient with gross hematuria for 18 months. Work up revealed bladder stone which was recently treated with cystolitholapaxy.  4. Bladder stone The patient underwent cystoscopy litholapaxy in January 2018. He did not undergo a TURP at that time because he was not on medication when the bladder stone formed and once medication started his PVR was low and his symptoms resolved. His bladder stone was 35% calcium oxalate dihydrate, 30% calcium oxalate monohydrate, and 35% calcium phosphate.       PMH: Past Medical History:  Diagnosis Date  . Discoloration of nail   . H/O prostate cancer    was treated with radiation  .  Hypertension   . Local recurrence of prostate cancer ([place]) 06/22/2007   Qualifier: Diagnosis of  By: Fuller Plan CMA (AAMA), Lugene    . Shingles     Surgical History: Past Surgical History:  Procedure Laterality Date  . ABDOMINAL SURGERY     ruptured intestines   . CATARACT EXTRACTION W/PHACO Left 01/29/2016   Procedure: CATARACT EXTRACTION PHACO AND INTRAOCULAR LENS PLACEMENT ([place]) left eye;  Surgeon: Leandrew Koyanagi, MD;  Location: [facility];  Service: Ophthalmology;  Laterality: Left;  RESTOR LENS  . CATARACT EXTRACTION W/PHACO Right 10/21/2016   Procedure: CATARACT EXTRACTION PHACO AND INTRAOCULAR LENS PLACEMENT ([facility])  Right restor toric lens;  Surgeon: Leandrew Koyanagi, MD;  Location: [facility];  Service: Ophthalmology;  Laterality: Right;  restor toric lens  . CYSTOSCOPY WITH LITHOLAPAXY N/A 08/14/2016   Procedure: CYSTOSCOPY WITH LITHOLAPAXY;  Surgeon: Nickie Retort, MD;  Location: [facility];  Service: Urology;  Laterality: N/A;  . FEMUR IM NAIL Right 10/08/2014   Procedure: INTRAMEDULLARY (IM) NAIL FEMORAL;  Surgeon: Rod Can, MD;  Location: [facility];  Service: Orthopedics;  Laterality: Right;  PER DANIELLE 110 MIN  . HOLMIUM LASER APPLICATION  2/58/5277   Procedure: HOLMIUM LASER APPLICATION;  Surgeon: Nickie Retort, MD;  Location: [facility];  Service: Urology;;  . OTHER SURGICAL HISTORY  2006   Prostate Radiation Surgery     Home Medications:  Allergies as of 05/28/2017   No Known Allergies     Medication  List       Accurate as of 05/28/17  2:30 PM. Always use your most recent med list.          cholecalciferol 1000 units tablet Commonly known as:  VITAMIN D Take 1,000 Units by mouth daily.   Fish Oil 1200 MG Caps Take 1,200 mg by mouth daily.   GLUCOSAMINE CHONDR COMPLEX PO Take 2 capsules by mouth daily.   multivitamin capsule Take 1 capsule by mouth daily.   tamsulosin 0.4 MG Caps capsule Commonly known as:  FLOMAX Take 2  capsules (0.8 mg total) by mouth daily.   vitamin C 1000 MG tablet Take 1,000 mg by mouth daily.       Allergies: No Known Allergies  Family History: Family History  Problem Relation Age of Onset  . Heart disease Brother   . Heart attack Brother   . Prostate cancer Neg Hx   . Bladder Cancer Neg Hx   . Kidney cancer Neg Hx     Social History:  reports that he quit smoking about 51 years ago. His smoking use included Cigarettes. He has a 15.00 pack-year smoking history. He has never used smokeless tobacco. He reports that he drinks about 3.5 oz of alcohol per week . He reports that he does not use drugs.  ROS: UROLOGY Frequent Urination?: No Hard to postpone urination?: No Burning/pain with urination?: No Get up at night to urinate?: No Leakage of urine?: No Urine stream starts and stops?: No Trouble starting stream?: No Do you have to strain to urinate?: No Blood in urine?: No Urinary tract infection?: No Sexually transmitted disease?: No Injury to kidneys or bladder?: No Painful intercourse?: No Weak stream?: No Erection problems?: No Penile pain?: No  Gastrointestinal Nausea?: No Vomiting?: No Indigestion/heartburn?: No Diarrhea?: No Constipation?: No  Constitutional Fever: No Night sweats?: No Weight loss?: No Fatigue?: No  Skin Skin rash/lesions?: No Itching?: No  Eyes Blurred vision?: No Double vision?: No  Ears/Nose/Throat Sore throat?: No Sinus problems?: No  Hematologic/Lymphatic Swollen glands?: No Easy bruising?: No  Cardiovascular Leg swelling?: No Chest pain?: No  Respiratory Cough?: Yes Shortness of breath?: No  Endocrine Excessive thirst?: No  Musculoskeletal Back pain?: No Joint pain?: No  Neurological Headaches?: No Dizziness?: No  Psychologic Depression?: No Anxiety?: No  Physical Exam: BP (!) 160/70 (BP Location: Right Arm, Patient Position: Sitting, Cuff Size: Large)   Pulse 75   Ht 5\' 8"  (1.727 m)   Wt  242 lb 12.8 oz (110.1 kg)   BMI 36.92 kg/m   Constitutional:  Alert and oriented, No acute distress. HEENT: [place] AT, moist mucus membranes.  Trachea midline, no masses. Cardiovascular: No clubbing, cyanosis, or edema. Respiratory: Normal respiratory effort, no increased work of breathing. GI: Abdomen is soft, nontender, nondistended, no abdominal masses GU: No CVA tenderness.  Skin: No rashes, bruises or suspicious lesions. Lymph: No cervical or inguinal adenopathy. Neurologic: Grossly intact, no focal deficits, moving all 4 extremities. Psychiatric: Normal mood and affect.  Laboratory Data: Lab Results  Component Value Date   WBC 6.4 08/07/2016   HGB 12.6 (L) 08/07/2016   HCT 37.4 (L) 08/07/2016   MCV 94.6 08/07/2016   PLT 221 08/07/2016    Lab Results  Component Value Date   CREATININE 1.00 02/02/2017    Lab Results  Component Value Date   PSA 0.47 10/21/2015   PSA 0.32 08/26/2009   PSA 0.32 12/07/2007    Lab Results  Component Value Date   TESTOSTERONE <  3 (L) 05/21/2017    No results found for: HGBA1C  Urinalysis    Component Value Date/Time   COLORURINE lt. yellow 06/13/2007 1105   APPEARANCEUR Cloudy (A) 07/23/2016 1132   LABSPEC <1.005 06/13/2007 1105   PHURINE 6.0 06/13/2007 1105   GLUCOSEU Negative 07/23/2016 1132   HGBUR negative 06/13/2007 1105   BILIRUBINUR Negative 07/23/2016 1132   PROTEINUR Negative 07/23/2016 1132   UROBILINOGEN 0.2 06/01/2016 0953   UROBILINOGEN negative 06/13/2007 1105   NITRITE Negative 07/23/2016 1132   NITRITE negative 06/13/2007 1105   LEUKOCYTESUR Negative 07/23/2016 1132    Assessment & Plan:    1. Biochemical recurrence of prostate cancer -Patient is requesting to only receive a 28-month Lupron shot at this time because he does not want to commit to a full 67-month course of androgen deprivation.  Will provide him with a 40-month Lupron shot.  He will follow-up in 3 months with a PSA and testosterone prior.  He has  had elevation of his PSA after radiation therapy in the past when he was taking exogenous testosterone that responded well to androgen deprivation therapy. He then went many years without elevation in his PSA. We will continue him on ADT for the time being. However, he may be a candidate for intermittent androgen deprivation therapy down the road given this history.  He is extremely interested in this in the future.  He will continue his calcium and vitamin D.  2. BPH Continue Flomax   No Follow-up on file.  Nickie Retort, MD  [facility] [address], [place], [place] [address] [phone]

## 2017-05-31 DIAGNOSIS — Z961 Presence of intraocular lens: Secondary | ICD-10-CM | POA: Diagnosis not present

## 2017-06-02 ENCOUNTER — Ambulatory Visit (INDEPENDENT_AMBULATORY_CARE_PROVIDER_SITE_OTHER): Payer: Medicare Other

## 2017-06-02 VITALS — BP 140/70 | HR 58 | Temp 97.6°F | Ht 66.75 in | Wt 240.5 lb

## 2017-06-02 DIAGNOSIS — Z13220 Encounter for screening for lipoid disorders: Secondary | ICD-10-CM | POA: Diagnosis not present

## 2017-06-02 DIAGNOSIS — Z0000 Encounter for general adult medical examination without abnormal findings: Secondary | ICD-10-CM | POA: Diagnosis not present

## 2017-06-02 DIAGNOSIS — E559 Vitamin D deficiency, unspecified: Secondary | ICD-10-CM

## 2017-06-02 DIAGNOSIS — I10 Essential (primary) hypertension: Secondary | ICD-10-CM | POA: Diagnosis not present

## 2017-06-02 LAB — VITAMIN D 25 HYDROXY (VIT D DEFICIENCY, FRACTURES): VITD: 34.09 ng/mL (ref 30.00–100.00)

## 2017-06-02 LAB — LIPID PANEL
CHOLESTEROL: 212 mg/dL — AB (ref 0–200)
HDL: 55.3 mg/dL (ref >39.00)
LDL CALC: 130 mg/dL — AB (ref 0–99)
NONHDL: 156.89
Total CHOL/HDL Ratio: 4
Triglycerides: 132 mg/dL (ref 0.0–149.0)
VLDL: 26.4 mg/dL (ref 0.0–40.0)

## 2017-06-02 LAB — HEPATIC FUNCTION PANEL
ALBUMIN: 4.4 g/dL (ref 3.5–5.2)
ALK PHOS: 44 U/L (ref 39–117)
ALT: 25 U/L (ref 0–53)
AST: 20 U/L (ref 0–37)
BILIRUBIN DIRECT: 0.2 mg/dL (ref 0.0–0.3)
BILIRUBIN TOTAL: 0.5 mg/dL (ref 0.2–1.2)
Total Protein: 7.6 g/dL (ref 6.0–8.3)

## 2017-06-02 LAB — CBC WITH DIFFERENTIAL/PLATELET
BASOS PCT: 0.6 % (ref 0.0–3.0)
Basophils Absolute: 0 10*3/uL (ref 0.0–0.1)
EOS ABS: 0.3 10*3/uL (ref 0.0–0.7)
EOS PCT: 4.1 % (ref 0.0–5.0)
HCT: 38.7 % — ABNORMAL LOW (ref 39.0–52.0)
Hemoglobin: 12.7 g/dL — ABNORMAL LOW (ref 13.0–17.0)
LYMPHS ABS: 1.2 10*3/uL (ref 0.7–4.0)
Lymphocytes Relative: 18.7 % (ref 12.0–46.0)
MCHC: 32.9 g/dL (ref 30.0–36.0)
MCV: 96.1 fl (ref 78.0–100.0)
MONO ABS: 0.5 10*3/uL (ref 0.1–1.0)
Monocytes Relative: 8.4 % (ref 3.0–12.0)
NEUTROS PCT: 68.2 % (ref 43.0–77.0)
Neutro Abs: 4.3 10*3/uL (ref 1.4–7.7)
PLATELETS: 212 10*3/uL (ref 150.0–400.0)
RBC: 4.03 Mil/uL — ABNORMAL LOW (ref 4.22–5.81)
RDW: 13.5 % (ref 11.5–15.5)
WBC: 6.3 10*3/uL (ref 4.0–10.5)

## 2017-06-02 LAB — BASIC METABOLIC PANEL
BUN: 23 mg/dL (ref 6–23)
CALCIUM: 9.9 mg/dL (ref 8.4–10.5)
CHLORIDE: 102 meq/L (ref 96–112)
CO2: 29 meq/L (ref 19–32)
CREATININE: 1.13 mg/dL (ref 0.40–1.50)
GFR: 65.59 mL/min (ref >60.00)
GLUCOSE: 109 mg/dL — AB (ref 70–99)
Potassium: 4.7 mEq/L (ref 3.5–5.1)
Sodium: 139 mEq/L (ref 135–145)

## 2017-06-02 NOTE — Progress Notes (Signed)
PCP notes:   Health maintenance:  Flu vaccine -pt declined PNA vaccine - pt declined  Abnormal screenings:   None  Patient concerns:   None  Nurse concerns:  None  Next PCP appt:   06/10/17 @ 0900

## 2017-06-02 NOTE — Progress Notes (Signed)
Subjective:   Cesar Powell is a 83 y.o. male who presents for Medicare Annual/Subsequent preventive examination.  Review of Systems:  N/A Cardiac Risk Factors include: advanced age (>74men, >76 women);male gender;obesity (BMI >30kg/m2);hypertension     Objective:    Vitals: BP 140/70 (BP Location: Right Arm, Patient Position: Sitting, Cuff Size: Normal)   Pulse (!) 58   Temp 97.6 F (36.4 C) (Oral)   Ht 5' 6.75" (1.695 m) Comment: no shoes  Wt 240 lb 8 oz (109.1 kg)   SpO2 94%   BMI 37.95 kg/m   Body mass index is 37.95 kg/m.  Tobacco History  Smoking Status  . Former Smoker  . Packs/day: 1.00  . Years: 15.00  . Types: Cigarettes  . Quit date: 09/14/1965  Smokeless Tobacco  . Never Used     Counseling given: No   Past Medical History:  Diagnosis Date  . Discoloration of nail   . H/O prostate cancer    was treated with radiation  . Hypertension   . Local recurrence of prostate cancer ([place]) 06/22/2007   Qualifier: Diagnosis of  By: Fuller Plan CMA (AAMA), [place]    . Shingles    Past Surgical History:  Procedure Laterality Date  . ABDOMINAL SURGERY     ruptured intestines   . CATARACT EXTRACTION W/PHACO Left 01/29/2016   Procedure: CATARACT EXTRACTION PHACO AND INTRAOCULAR LENS PLACEMENT ([place]) left eye;  Surgeon: Leandrew Koyanagi, MD;  Location: [place];  Service: Ophthalmology;  Laterality: Left;  RESTOR LENS  . CATARACT EXTRACTION W/PHACO Right 10/21/2016   Procedure: CATARACT EXTRACTION PHACO AND INTRAOCULAR LENS PLACEMENT ([facility])  Right restor toric lens;  Surgeon: Leandrew Koyanagi, MD;  Location: [place];  Service: Ophthalmology;  Laterality: Right;  restor toric lens  . CYSTOSCOPY WITH LITHOLAPAXY N/A 08/14/2016   Procedure: CYSTOSCOPY WITH LITHOLAPAXY;  Surgeon: Nickie Retort, MD;  Location: [facility];  Service: Urology;  Laterality: N/A;  . FEMUR IM NAIL Right 10/08/2014   Procedure: INTRAMEDULLARY (IM) NAIL FEMORAL;  Surgeon:  Rod Can, MD;  Location: [place];  Service: Orthopedics;  Laterality: Right;  PER DANIELLE 110 MIN  . HOLMIUM LASER APPLICATION  11/09/8117   Procedure: HOLMIUM LASER APPLICATION;  Surgeon: Nickie Retort, MD;  Location: [facility];  Service: Urology;;  . OTHER SURGICAL HISTORY  2006   Prostate Radiation Surgery    Family History  Problem Relation Age of Onset  . Heart disease Brother   . Heart attack Brother   . Prostate cancer Neg Hx   . Bladder Cancer Neg Hx   . Kidney cancer Neg Hx    History  Sexual Activity  . Sexual activity: Not on file    Outpatient Encounter Prescriptions as of 06/02/2017  Medication Sig  . Ascorbic Acid (VITAMIN C) 1000 MG tablet Take 1,000 mg by mouth daily.  . cholecalciferol (VITAMIN D) 1000 UNITS tablet Take 1,000 Units by mouth daily.  . Glucosamine-Chondroitin (GLUCOSAMINE CHONDR COMPLEX PO) Take 2 capsules by mouth daily.  . Multiple Vitamin (MULTIVITAMIN) capsule Take 1 capsule by mouth daily.  . Omega-3 Fatty Acids (FISH OIL) 1200 MG CAPS Take 1,200 mg by mouth daily.  . tamsulosin (FLOMAX) 0.4 MG CAPS capsule Take 2 capsules (0.8 mg total) by mouth daily.   No facility-administered encounter medications on file as of 06/02/2017.     Activities of Daily Living In your present state of health, do you have any difficulty performing the following activities: 06/02/2017 10/21/2016  Hearing? Y [place]? N N  Difficulty concentrating or making decisions? N N  Walking or climbing stairs? N N  Dressing or bathing? N N  Doing errands, shopping? N -  Preparing Food and eating ? N -  Using the Toilet? N -  In the past six months, have you accidently leaked urine? N -  Do you have problems with loss of bowel control? N -  Managing your Medications? N -  Managing your Finances? N -  Housekeeping or managing your Housekeeping? N -  Some recent data might be hidden    Patient Care Team: Owens Loffler, MD as PCP -  General Leandrew Koyanagi, MD as Referring Physician (Ophthalmology) Nickie Retort, MD as Consulting Physician (Urology)   Assessment:    Hearing Screening Comments: Bilateral hearing aids Vision Screening Comments: Last vision exam in Oct 2018 with Dr. Wallace Going  Exercise Activities and Dietary recommendations Current Exercise Habits: Home exercise routine, Type of exercise: Other - see comments;strength training/weights (stationary bike), Time (Minutes): 60 (60-90 minutes), Frequency (Times/Week): 3, Weekly Exercise (Minutes/Week): 180, Intensity: Moderate, Exercise limited by: None identified  Goals    . Increase physical activity          Starting 06/02/2017, I will continue to exercise for 60-90 minutes 3 days per week.       Fall Risk Fall Risk  06/02/2017 10/21/2015  Falls in the past year? No No   Depression Screen PHQ 2/9 Scores 06/02/2017 10/21/2015  PHQ - 2 Score 0 0  PHQ- 9 Score 0 -    Cognitive Function MMSE - Mini Mental State Exam 06/02/2017  Orientation to time 5  Orientation to Place 5  Registration 3  Attention/ Calculation 0  Recall 3  Language- name 2 objects 0  Language- repeat 1  Language- follow 3 step command 3  Language- read & follow direction 0  Write a sentence 0  Copy design 0  Total score 20     PLEASE NOTE: A Mini-Cog screen was completed. Maximum score is 20. A value of 0 denotes this part of [place] MMSE was not completed or the patient failed this part of the Mini-Cog screening.   Mini-Cog Screening Orientation to Time - Max 5 pts Orientation to Place - Max 5 pts Registration - Max 3 pts Recall - Max 3 pts Language Repeat - Max 1 pts Language Follow 3 Step Command - Max 3 pts     Immunization History  Administered Date(s) Administered  . Influenza Whole 09/05/2009  . Pneumococcal Polysaccharide-23 09/05/2009  . Td 09/05/2009  . Zoster 09/05/2009   Screening Tests Health Maintenance  Topic Date Due  .  INFLUENZA VACCINE  10/31/2017 (Originally 03/03/2017)  . PNA vac Low Risk Adult (2 of 2 - PCV13) 10/31/2017 (Originally 09/05/2010)  . TETANUS/TDAP  09/06/2019      Plan:   I have personally reviewed, addressed, and noted the following in the patient's chart:  A. Medical and social history B. Use of alcohol, tobacco or illicit drugs  C. Current medications and supplements D. Functional ability and status E.  Nutritional status F.  Physical activity G. Advance directives H. List of other physicians I.  Hospitalizations, surgeries, and ER visits in previous 12 months J.  [place] to include hearing, vision, cognitive, depression L. Referrals and appointments - none  In addition, I have reviewed and discussed with patient certain preventive protocols, quality metrics, and best practice recommendations. A  written personalized care plan for preventive services as well as general preventive health recommendations were provided to patient.  See attached scanned questionnaire for additional information.   Signed,   Lindell Noe, MHA, BS, LPN Health Coach

## 2017-06-02 NOTE — Progress Notes (Signed)
I reviewed health advisor's note, was available for consultation, and agree with documentation and plan.   Signed,  Tudor Chandley T. Syrina Wake, MD  

## 2017-06-02 NOTE — Patient Instructions (Signed)
Cesar Powell , Thank you for taking time to come for your Medicare Wellness Visit. I appreciate your ongoing commitment to your health goals. Please review the following plan we discussed and let me know if I can assist you in the future.   These are the goals we discussed: Goals    . Increase physical activity          Starting 06/02/2017, I will continue to exercise for 60-90 minutes 3 days per week.        This is a list of the screening recommended for you and due dates:  Health Maintenance  Topic Date Due  . Flu Shot  10/31/2017*  . Pneumonia vaccines (2 of 2 - PCV13) 10/31/2017*  . Tetanus Vaccine  09/06/2019  *Topic was postponed. The date shown is not the original due date.   Preventive Care for Adults  A healthy lifestyle and preventive care can promote health and wellness. Preventive health guidelines for adults include the following key practices.  . A routine yearly physical is a good way to check with your health care provider about your health and preventive screening. It is a chance to share any concerns and updates on your health and to receive a thorough exam.  . Visit your dentist for a routine exam and preventive care every 6 months. Brush your teeth twice a day and floss once a day. Good oral hygiene prevents tooth decay and gum disease.  . The frequency of eye exams is based on your age, health, family medical history, use  of contact lenses, and other factors. Follow your health care provider's recommendations for frequency of eye exams.  . Eat a healthy diet. Foods like vegetables, fruits, whole grains, low-fat dairy products, and lean protein foods contain the nutrients you need without too many calories. Decrease your intake of foods high in solid fats, added sugars, and salt. Eat the right amount of calories for you. Get information about a proper diet from your health care provider, if necessary.  . Regular physical exercise is one of the most important  things you can do for your health. Most adults should get at least 150 minutes of moderate-intensity exercise (any activity that increases your heart rate and causes you to sweat) each week. In addition, most adults need muscle-strengthening exercises on 2 or more days a week.  Silver Sneakers may be a benefit available to you. To determine eligibility, you may visit the website: www.silversneakers.com or contact program at 815 218 5000 Mon-Fri between 8AM-8PM.   . Maintain a healthy weight. The body mass index (BMI) is a screening tool to identify possible weight problems. It provides an estimate of body fat based on height and weight. Your health care provider can find your BMI and can help you achieve or maintain a healthy weight.   For adults 20 years and older: ? A BMI below 18.5 is considered underweight. ? A BMI of 18.5 to 24.9 is normal. ? A BMI of 25 to 29.9 is considered overweight. ? A BMI of 30 and above is considered obese.   . Maintain normal blood lipids and cholesterol levels by exercising and minimizing your intake of saturated fat. Eat a balanced diet with plenty of fruit and vegetables. Blood tests for lipids and cholesterol should begin at age 35 and be repeated every 5 years. If your lipid or cholesterol levels are high, you are over 50, or you are at high risk for heart disease, you may need your cholesterol levels  checked more frequently. Ongoing high lipid and cholesterol levels should be treated with medicines if diet and exercise are not working.  . If you smoke, find out from your health care provider how to quit. If you do not use tobacco, please do not start.  . If you choose to drink alcohol, please do not consume more than 2 drinks per day. One drink is considered to be 12 ounces (355 mL) of beer, 5 ounces (148 mL) of wine, or 1.5 ounces (44 mL) of liquor.  . If you are 67-8 years old, ask your health care provider if you should take aspirin to prevent  strokes.  . Use sunscreen. Apply sunscreen liberally and repeatedly throughout the day. You should seek shade when your shadow is shorter than you. Protect yourself by wearing long sleeves, pants, a wide-brimmed hat, and sunglasses year round, whenever you are outdoors.  . Once a month, do a whole body skin exam, using a mirror to look at the skin on your back. Tell your health care provider of new moles, moles that have irregular borders, moles that are larger than a pencil eraser, or moles that have changed in shape or color.

## 2017-06-02 NOTE — Progress Notes (Signed)
Pre visit review using our clinic review tool, if applicable. No additional management support is needed unless otherwise documented below in the visit note. 

## 2017-06-08 DIAGNOSIS — M9903 Segmental and somatic dysfunction of lumbar region: Secondary | ICD-10-CM | POA: Diagnosis not present

## 2017-06-08 DIAGNOSIS — M5416 Radiculopathy, lumbar region: Secondary | ICD-10-CM | POA: Diagnosis not present

## 2017-06-08 DIAGNOSIS — M9905 Segmental and somatic dysfunction of pelvic region: Secondary | ICD-10-CM | POA: Diagnosis not present

## 2017-06-08 DIAGNOSIS — M955 Acquired deformity of pelvis: Secondary | ICD-10-CM | POA: Diagnosis not present

## 2017-06-10 ENCOUNTER — Ambulatory Visit (INDEPENDENT_AMBULATORY_CARE_PROVIDER_SITE_OTHER): Payer: Medicare Other | Admitting: Family Medicine

## 2017-06-10 ENCOUNTER — Encounter: Payer: Self-pay | Admitting: Family Medicine

## 2017-06-10 VITALS — BP 128/60 | HR 62 | Temp 97.5°F | Ht 66.75 in | Wt 247.0 lb

## 2017-06-10 DIAGNOSIS — Z23 Encounter for immunization: Secondary | ICD-10-CM | POA: Diagnosis not present

## 2017-06-10 DIAGNOSIS — R635 Abnormal weight gain: Secondary | ICD-10-CM

## 2017-06-10 DIAGNOSIS — E8881 Metabolic syndrome: Secondary | ICD-10-CM

## 2017-06-10 DIAGNOSIS — C61 Malignant neoplasm of prostate: Secondary | ICD-10-CM | POA: Diagnosis not present

## 2017-06-10 DIAGNOSIS — D508 Other iron deficiency anemias: Secondary | ICD-10-CM | POA: Diagnosis not present

## 2017-06-10 NOTE — Progress Notes (Signed)
Dr. Frederico Hamman T. Hollie Bartus, MD, [facility] [place], [address] Phone: [phone] Fax: [phone]  06/10/2017  Patient: Cesar Powell, MRN: [id number], DOB: Mar 07, 1931, 83 y.o.  Primary Physician:  Owens Loffler, MD   Chief Complaint  Patient presents with  . Annual Exam    Part 2   Subjective:   Cesar Powell is a 83 y.o. very pleasant male patient who presents with the following:  F/u long term med probs  Recurrence of prostate cancer. On Lupron. He has consistently gained weight over the last year.  O/w on Flomax. Stream is better.   Declines all vaccines initially.  Immunization History  Administered Date(s) Administered  . Influenza Whole 09/05/2009  . Pneumococcal Polysaccharide-23 09/05/2009  . Td 09/05/2009  . Zoster 09/05/2009    Mild anemia noted on labs. No bleeding.   CBC:    Component Value Date/Time   WBC 6.3 06/02/2017 1204   HGB 12.7 (L) 06/02/2017 1204   HGB 16.7 03/08/2012 1641   HCT 38.7 (L) 06/02/2017 1204   HCT 49.2 03/08/2012 1641   PLT 212.0 06/02/2017 1204   PLT 198 03/08/2012 1641   MCV 96.1 06/02/2017 1204   MCV 96 03/08/2012 1641   NEUTROABS 4.3 06/02/2017 1204   LYMPHSABS 1.2 06/02/2017 1204   MONOABS 0.5 06/02/2017 1204   EOSABS 0.3 06/02/2017 1204   BASOSABS 0.0 06/02/2017 1204     Past Medical History, Surgical History, Social History, Family History, Problem List, Medications, and Allergies have been reviewed and updated if relevant.  Patient Active Problem List   Diagnosis Date Noted  . Subtrochanteric fracture of femur ([place]) 10/08/2014  . Essential hypertension 09/06/2009  . ERECTILE DYSFUNCTION, MILD 07/25/2007  . METABOLIC SYNDROME X 56/31/4970  . Obesity 06/22/2007  . DIVERTICULOSIS, COLON 06/22/2007  . Obstructive sleep apnea 06/22/2007  . Local recurrence of prostate cancer ([place]) 06/22/2007  . DIVERTICULITIS, HX OF 06/22/2007    Past  Medical History:  Diagnosis Date  . Discoloration of nail   . H/O prostate cancer    was treated with radiation  . Hypertension   . Local recurrence of prostate cancer ([place]) 06/22/2007   Qualifier: Diagnosis of  By: Fuller Plan CMA (AAMA), [place]    . Shingles     Past Surgical History:  Procedure Laterality Date  . ABDOMINAL SURGERY     ruptured intestines   . OTHER SURGICAL HISTORY  2006   Prostate Radiation Surgery     Social History   Socioeconomic History  . Marital status: Married    Spouse name: Not on file  . Number of children: Not on file  . Years of education: Not on file  . Highest education level: Not on file  Social Needs  . Financial resource strain: Not on file  . Food insecurity - worry: Not on file  . Food insecurity - inability: Not on file  . Transportation needs - medical: Not on file  . Transportation needs - non-medical: Not on file  Occupational History  . Occupation: retired  Tobacco Use  . Smoking status: Former Smoker    Packs/day: 1.00    Years: 15.00    Pack years: 15.00    Types: Cigarettes    Last attempt to quit: 09/14/1965    Years since quitting: 51.7  . Smokeless tobacco: Never Used  Substance and Sexual Activity  . Alcohol use: Yes    Alcohol/week: 7.2 oz  Types: 7 Standard drinks or equivalent, 5 Shots of liquor per week  . Drug use: No  . Sexual activity: Not on file  Other Topics Concern  . Not on file  Social History Narrative  . Not on file    Family History  Problem Relation Age of Onset  . Heart disease Brother   . Heart attack Brother   . Prostate cancer Neg Hx   . Bladder Cancer Neg Hx   . Kidney cancer Neg Hx     No Known Allergies  Medication list reviewed and updated in full in [place].   GEN: No acute illnesses, no fevers, chills. GI: No n/v/d, eating normally Pulm: No SOB Interactive and getting along well at home.  Otherwise, ROS is as per the HPI.  Objective:   BP 128/60   Pulse 62    Temp (!) 97.5 F (36.4 C) (Oral)   Ht 5' 6.75" (1.695 m)   Wt 247 lb (112 kg)   BMI 38.98 kg/m   GEN: WDWN, NAD, Non-toxic, A & O x 3 HEENT: Atraumatic, Normocephalic. Neck supple. No masses, No LAD. Ears and Nose: No external deformity. CV: RRR, No M/G/R. No JVD. No thrill. No extra heart sounds. PULM: CTA B, no wheezes, crackles, rhonchi. No retractions. No resp. distress. No accessory muscle use. EXTR: No c/c/e NEURO Normal gait.  PSYCH: Normally interactive. Conversant. Not depressed or anxious appearing.  Calm demeanor.   Laboratory and Imaging Data: Results for orders placed or performed in visit on 06/02/17  Lipid Panel  Result Value Ref Range   Cholesterol 212 (H) 0 - 200 mg/dL   Triglycerides 132.0 0.0 - 149.0 mg/dL   HDL 55.30 >39.00 mg/dL   VLDL 26.4 0.0 - 40.0 mg/dL   LDL Cholesterol 130 (H) 0 - 99 mg/dL   Total CHOL/HDL Ratio 4    NonHDL 156.89   CBC with Differential/Platelet  Result Value Ref Range   WBC 6.3 4.0 - 10.5 K/uL   RBC 4.03 (L) 4.22 - 5.81 Mil/uL   Hemoglobin 12.7 (L) 13.0 - 17.0 g/dL   HCT 38.7 (L) 39.0 - 52.0 %   MCV 96.1 78.0 - 100.0 fl   MCHC 32.9 30.0 - 36.0 g/dL   RDW 13.5 11.5 - 15.5 %   Platelets 212.0 150.0 - 400.0 K/uL   Neutrophils Relative % 68.2 43.0 - 77.0 %   Lymphocytes Relative 18.7 12.0 - 46.0 %   Monocytes Relative 8.4 3.0 - 12.0 %   Eosinophils Relative 4.1 0.0 - 5.0 %   Basophils Relative 0.6 0.0 - 3.0 %   Neutro Abs 4.3 1.4 - 7.7 K/uL   Lymphs Abs 1.2 0.7 - 4.0 K/uL   Monocytes Absolute 0.5 0.1 - 1.0 K/uL   Eosinophils Absolute 0.3 0.0 - 0.7 K/uL   Basophils Absolute 0.0 0.0 - 0.1 K/uL  Hepatic Function Panel  Result Value Ref Range   Total Bilirubin 0.5 0.2 - 1.2 mg/dL   Bilirubin, Direct 0.2 0.0 - 0.3 mg/dL   Alkaline Phosphatase 44 39 - 117 U/L   AST 20 0 - 37 U/L   ALT 25 0 - 53 U/L   Total Protein 7.6 6.0 - 8.3 g/dL   Albumin 4.4 3.5 - 5.2 g/dL  Basic Metabolic Panel  Result Value Ref Range   Sodium 139  135 - 145 mEq/L   Potassium 4.7 3.5 - 5.1 mEq/L   Chloride 102 96 - 112 mEq/L   CO2 29 19 -  32 mEq/L   Glucose, Bld 109 (H) 70 - 99 mg/dL   BUN 23 6 - 23 mg/dL   Creatinine, Ser 1.13 0.40 - 1.50 mg/dL   Calcium 9.9 8.4 - 10.5 mg/dL   GFR 65.59 >60.00 mL/min  Vitamin D, 25-hydroxy  Result Value Ref Range   VITD 34.09 30.00 - 100.00 ng/mL     Assessment and Plan:   Local recurrence of prostate cancer ([place])  Need for prophylactic vaccination and inoculation against influenza - Plan: Flu Vaccine QUAD 36+ mos IM  Need for prophylactic vaccination against Streptococcus pneumoniae (pneumococcus) - Plan: Pneumococcal conjugate vaccine [id number]  METABOLIC SYNDROME X  Other iron deficiency anemia  Weight gain  Doing ok at 86.  Weight gain and fatigue on Lupron. BP and lipids remain stable.   Normocytic, normochromic anemia, mild - fe sulfate 325 mg daily  Follow-up: No Follow-up on file.  Future Appointments  Date Time Provider [place]  09/07/2017 11:00 AM [facility] None  09/09/2017  1:45 PM [facility] None  06/03/2018 11:15 AM Pinson, Venetia Maxon, LPN [facility]   Orders Placed This Encounter  Procedures  . Flu Vaccine QUAD 36+ mos IM  . Pneumococcal conjugate vaccine 13-valent    Signed,  Cherae Marton T. Vianne Grieshop, MD   Allergies as of 06/10/2017   No Known Allergies     Medication List        Accurate as of 06/10/17 10:11 AM. Always use your most recent med list.          cholecalciferol 1000 units tablet Commonly known as:  VITAMIN D Take 1,000 Units by mouth daily.   Fish Oil 1200 MG Caps Take 1,200 mg by mouth daily.   GLUCOSAMINE CHONDR COMPLEX PO Take 2 capsules by mouth daily.   multivitamin capsule Take 1 capsule by mouth daily.   tamsulosin 0.4 MG Caps capsule Commonly known as:  FLOMAX Take 2 capsules (0.8 mg total) by mouth daily.   vitamin C 1000 MG tablet Take 1,000 mg by mouth daily.

## 2017-07-07 DIAGNOSIS — M955 Acquired deformity of pelvis: Secondary | ICD-10-CM | POA: Diagnosis not present

## 2017-07-07 DIAGNOSIS — M9903 Segmental and somatic dysfunction of lumbar region: Secondary | ICD-10-CM | POA: Diagnosis not present

## 2017-07-07 DIAGNOSIS — M9905 Segmental and somatic dysfunction of pelvic region: Secondary | ICD-10-CM | POA: Diagnosis not present

## 2017-07-07 DIAGNOSIS — M5416 Radiculopathy, lumbar region: Secondary | ICD-10-CM | POA: Diagnosis not present

## 2017-08-11 DIAGNOSIS — M5416 Radiculopathy, lumbar region: Secondary | ICD-10-CM | POA: Diagnosis not present

## 2017-08-11 DIAGNOSIS — M9905 Segmental and somatic dysfunction of pelvic region: Secondary | ICD-10-CM | POA: Diagnosis not present

## 2017-08-11 DIAGNOSIS — M9903 Segmental and somatic dysfunction of lumbar region: Secondary | ICD-10-CM | POA: Diagnosis not present

## 2017-08-11 DIAGNOSIS — M955 Acquired deformity of pelvis: Secondary | ICD-10-CM | POA: Diagnosis not present

## 2017-09-07 ENCOUNTER — Encounter: Payer: Self-pay | Admitting: Urology

## 2017-09-07 ENCOUNTER — Other Ambulatory Visit: Payer: Medicare Other

## 2017-09-07 DIAGNOSIS — M9905 Segmental and somatic dysfunction of pelvic region: Secondary | ICD-10-CM | POA: Diagnosis not present

## 2017-09-07 DIAGNOSIS — M9903 Segmental and somatic dysfunction of lumbar region: Secondary | ICD-10-CM | POA: Diagnosis not present

## 2017-09-07 DIAGNOSIS — M5416 Radiculopathy, lumbar region: Secondary | ICD-10-CM | POA: Diagnosis not present

## 2017-09-07 DIAGNOSIS — M955 Acquired deformity of pelvis: Secondary | ICD-10-CM | POA: Diagnosis not present

## 2017-09-09 ENCOUNTER — Ambulatory Visit (INDEPENDENT_AMBULATORY_CARE_PROVIDER_SITE_OTHER): Payer: Medicare Other | Admitting: Urology

## 2017-09-09 ENCOUNTER — Encounter: Payer: Self-pay | Admitting: Urology

## 2017-09-09 VITALS — BP 191/75 | HR 61 | Ht 66.75 in | Wt 251.2 lb

## 2017-09-09 DIAGNOSIS — C61 Malignant neoplasm of prostate: Secondary | ICD-10-CM

## 2017-09-09 MED ORDER — LEUPROLIDE ACETATE (3 MONTH) 22.5 MG IM KIT
22.5000 mg | PACK | Freq: Once | INTRAMUSCULAR | Status: AC
  Administered 2017-09-09: 22.5 mg via INTRAMUSCULAR

## 2017-09-09 NOTE — Progress Notes (Signed)
09/09/2017 2:25 PM   Cesar Powell 1931/05/19 [id number]  Referring provider: Owens Loffler, MD [address] [place], [place] [address]  Chief Complaint  Patient presents with  . Prostate Cancer    HPI: The patient is an 84 year old gentleman with a past medical history of prostate cancer treated with radiation who presents for follow up.   1. Biochemical recurrence of prostate cancer Treated with radiation in 2005. After radiation his PSA ranat approximately 0.03 for a number of years. He was then started on Axironfor low testosterone. His PSA then started to rise eventually to ashigh as 18. His Axiron was stopped at this point and he was given 1 dose of Lupron. His PSA then fell and has been at 0.4 for the last year. It was last checked in March 2017. His PSA rose to 1.9 in March 2018. It was 3.6 in June 2018. He was given his first 3 monthLupron injection on January 14, 2017. He underwent CT and bone scan which showed no evidence of metastatic disease. PSA decreased to 1.7 in October 2018. Testosterone < 3.  2. BPH Currently on Flomax. Prior to starting Flomax, he had severe urinary urgency with urge incontinence. At that time, his PVR was 219 cc. After starting Flomax, his symptoms greatly improved. LastIPSS was 11/2.   3. Gross hematuria Patient with gross hematuria for 18 months. Work up revealed bladder stone which was recently treated with cystolitholapaxy.  4. Bladder stone The patient underwent cystoscopy litholapaxy in January 2018. He did not undergo a TURP at that time because he was not on medication when the bladder stone formed and once medication started his PVR was low and his symptoms resolved. His bladder stone was 35% calcium oxalate dihydrate, 30% calcium oxalate monohydrate, and 35% calcium phosphate.        PMH: Past Medical History:  Diagnosis Date  . Discoloration of nail   . H/O prostate cancer    was treated with radiation    . Hypertension   . Local recurrence of prostate cancer ([place]) 06/22/2007   Qualifier: Diagnosis of  By: Fuller Plan CMA (AAMA), Lugene    . Shingles     Surgical History: Past Surgical History:  Procedure Laterality Date  . ABDOMINAL SURGERY     ruptured intestines   . CATARACT EXTRACTION W/PHACO Left 01/29/2016   Procedure: CATARACT EXTRACTION PHACO AND INTRAOCULAR LENS PLACEMENT ([place]) left eye;  Surgeon: Leandrew Koyanagi, MD;  Location: [place] [facility];  Service: Ophthalmology;  Laterality: Left;  RESTOR LENS  . CATARACT EXTRACTION W/PHACO Right 10/21/2016   Procedure: CATARACT EXTRACTION PHACO AND INTRAOCULAR LENS PLACEMENT ([facility])  Right restor toric lens;  Surgeon: Leandrew Koyanagi, MD;  Location: [facility];  Service: Ophthalmology;  Laterality: Right;  restor toric lens  . CYSTOSCOPY WITH LITHOLAPAXY N/A 08/14/2016   Procedure: CYSTOSCOPY WITH LITHOLAPAXY;  Surgeon: Nickie Retort, MD;  Location: [facility];  Service: Urology;  Laterality: N/A;  . FEMUR IM NAIL Right 10/08/2014   Procedure: INTRAMEDULLARY (IM) NAIL FEMORAL;  Surgeon: Rod Can, MD;  Location: [place] [facility];  Service: Orthopedics;  Laterality: Right;  PER DANIELLE 110 MIN  . HOLMIUM LASER APPLICATION  1/60/1093   Procedure: HOLMIUM LASER APPLICATION;  Surgeon: Nickie Retort, MD;  Location: [facility];  Service: Urology;;  . OTHER SURGICAL HISTORY  2006   Prostate Radiation Surgery     Home Medications:  Allergies as of 09/09/2017   No Known Allergies     Medication  List        Accurate as of 09/09/17  2:25 PM. Always use your most recent med list.          cholecalciferol 1000 units tablet Commonly known as:  VITAMIN D Take 1,000 Units by mouth daily.   Fish Oil 1200 MG Caps Take 1,200 mg by mouth daily.   GLUCOSAMINE CHONDR COMPLEX PO Take 2 capsules by mouth daily.   multivitamin capsule Take 1 capsule by mouth daily.   tamsulosin 0.4 MG Caps capsule Commonly known as:  FLOMAX Take 2  capsules (0.8 mg total) by mouth daily.   vitamin C 1000 MG tablet Take 1,000 mg by mouth daily.       Allergies: No Known Allergies  Family History: Family History  Problem Relation Age of Onset  . Heart disease Brother   . Heart attack Brother   . Prostate cancer Neg Hx   . Bladder Cancer Neg Hx   . Kidney cancer Neg Hx     Social History:  reports that he quit smoking about 52 years ago. His smoking use included cigarettes. He has a 15.00 pack-year smoking history. he has never used smokeless tobacco. He reports that he drinks about 7.2 oz of alcohol per week. He reports that he does not use drugs.  ROS: UROLOGY Frequent Urination?: No Hard to postpone urination?: No Burning/pain with urination?: No Get up at night to urinate?: No Leakage of urine?: No Urine stream starts and stops?: No Trouble starting stream?: No Do you have to strain to urinate?: No Blood in urine?: No Urinary tract infection?: No Sexually transmitted disease?: No Injury to kidneys or bladder?: No Painful intercourse?: No Weak stream?: No Erection problems?: No Penile pain?: No  Gastrointestinal Nausea?: No Vomiting?: No Indigestion/heartburn?: No Diarrhea?: No Constipation?: No  Constitutional Fever: No Night sweats?: No Weight loss?: No Fatigue?: No  Skin Skin rash/lesions?: No Itching?: No  Eyes Blurred vision?: No Double vision?: No  Ears/Nose/Throat Sore throat?: No Sinus problems?: No  Hematologic/Lymphatic Swollen glands?: No Easy bruising?: No  Cardiovascular Leg swelling?: No Chest pain?: No  Respiratory Cough?: No Shortness of breath?: No  Endocrine Excessive thirst?: No  Musculoskeletal Back pain?: No Joint pain?: No  Neurological Headaches?: No Dizziness?: No  Psychologic Depression?: No Anxiety?: No  Physical Exam: BP (!) 191/75 (BP Location: Right Arm, Patient Position: Sitting, Cuff Size: Large)   Pulse 61   Ht 5' 6.75" (1.695 m)    Wt 251 lb 3.2 oz (113.9 kg)   BMI 39.64 kg/m   Constitutional:  Alert and oriented, No acute distress. HEENT: [place] AT, moist mucus membranes.  Trachea midline, no masses. Cardiovascular: No clubbing, cyanosis, or edema. Respiratory: Normal respiratory effort, no increased work of breathing. GI: Abdomen is soft, nontender, nondistended, no abdominal masses GU: No CVA tenderness.  Skin: No rashes, bruises or suspicious lesions. Lymph: No cervical or inguinal adenopathy. Neurologic: Grossly intact, no focal deficits, moving all 4 extremities. Psychiatric: Normal mood and affect.  Laboratory Data: Lab Results  Component Value Date   WBC 6.3 06/02/2017   HGB 12.7 (L) 06/02/2017   HCT 38.7 (L) 06/02/2017   MCV 96.1 06/02/2017   PLT 212.0 06/02/2017    Lab Results  Component Value Date   CREATININE 1.13 06/02/2017    Lab Results  Component Value Date   PSA 0.47 10/21/2015   PSA 0.32 08/26/2009   PSA 0.32 12/07/2007    Lab Results  Component Value Date   TESTOSTERONE <  3 (L) 05/21/2017    No results found for: HGBA1C  Urinalysis    Component Value Date/Time   COLORURINE lt. yellow 06/13/2007 1105   APPEARANCEUR Cloudy (A) 07/23/2016 1132   LABSPEC <1.005 06/13/2007 1105   PHURINE 6.0 06/13/2007 1105   GLUCOSEU Negative 07/23/2016 1132   HGBUR negative 06/13/2007 1105   BILIRUBINUR Negative 07/23/2016 1132   PROTEINUR Negative 07/23/2016 1132   UROBILINOGEN 0.2 06/01/2016 0953   UROBILINOGEN negative 06/13/2007 1105   NITRITE Negative 07/23/2016 1132   NITRITE negative 06/13/2007 1105   LEUKOCYTESUR Negative 07/23/2016 1132     Assessment & Plan:    1. Biochemical recurrence of prostate cancer -Patient missed his PSA and testosterone lab draw prior to this visit.  We will draw his PSA/T today.  He is requesting to stay with the short term 34-month Lupron shot at this time.  We will provide him with this.  We will have him follow-up in 3 months for repeat Lupron  with PSA and testosterone prior.  He has had elevation of his PSA after radiation therapy in the past when he was taking exogenous testosterone that responded well toandrogendeprivation therapy. He then went many years without elevation in his PSA. We will continue him on ADTfor the time being. However, he may be a candidate for intermittent androgen deprivation therapy down the road given this history.  He is extremely interested in this in the future.  He will continue his calcium and vitamin D.   Return in about 3 months (around 12/07/2017) for for lupron with PSA/T prior.  Nickie Retort, MD  [facility] [address] [place], [place] [address] [phone]

## 2017-09-09 NOTE — Addendum Note (Signed)
Addended by: Kyra Manges on: 09/09/2017 02:51 PM   Modules accepted: Orders

## 2017-09-09 NOTE — Progress Notes (Signed)
Lupron IM Injection   Due to Prostate Cancer patient is present today for a Lupron Injection.  Medication: Lupron 3 month Dose: 22.5 mg  Location: right upper outer buttocks Lot: [id number] Exp: 03/07/2020  Patient tolerated well, no complications were noted  Performed by: Elberta Leatherwood, CMA  Follow up: 3 months

## 2017-09-10 LAB — PSA: PROSTATE SPECIFIC AG, SERUM: 1.3 ng/mL (ref 0.0–4.0)

## 2017-09-10 LAB — TESTOSTERONE

## 2017-09-13 ENCOUNTER — Telehealth: Payer: Self-pay

## 2017-09-13 NOTE — Telephone Encounter (Signed)
Letter sent.

## 2017-10-05 DIAGNOSIS — M5416 Radiculopathy, lumbar region: Secondary | ICD-10-CM | POA: Diagnosis not present

## 2017-10-05 DIAGNOSIS — M955 Acquired deformity of pelvis: Secondary | ICD-10-CM | POA: Diagnosis not present

## 2017-10-05 DIAGNOSIS — M9903 Segmental and somatic dysfunction of lumbar region: Secondary | ICD-10-CM | POA: Diagnosis not present

## 2017-10-05 DIAGNOSIS — M9905 Segmental and somatic dysfunction of pelvic region: Secondary | ICD-10-CM | POA: Diagnosis not present

## 2017-11-02 DIAGNOSIS — M9903 Segmental and somatic dysfunction of lumbar region: Secondary | ICD-10-CM | POA: Diagnosis not present

## 2017-11-02 DIAGNOSIS — M5416 Radiculopathy, lumbar region: Secondary | ICD-10-CM | POA: Diagnosis not present

## 2017-11-02 DIAGNOSIS — M955 Acquired deformity of pelvis: Secondary | ICD-10-CM | POA: Diagnosis not present

## 2017-11-02 DIAGNOSIS — M9905 Segmental and somatic dysfunction of pelvic region: Secondary | ICD-10-CM | POA: Diagnosis not present

## 2017-11-15 ENCOUNTER — Encounter: Payer: Self-pay | Admitting: Family Medicine

## 2017-11-15 ENCOUNTER — Other Ambulatory Visit: Payer: Self-pay

## 2017-11-15 ENCOUNTER — Ambulatory Visit (INDEPENDENT_AMBULATORY_CARE_PROVIDER_SITE_OTHER): Payer: Medicare Other | Admitting: Family Medicine

## 2017-11-15 ENCOUNTER — Ambulatory Visit (INDEPENDENT_AMBULATORY_CARE_PROVIDER_SITE_OTHER)
Admission: RE | Admit: 2017-11-15 | Discharge: 2017-11-15 | Disposition: A | Discharge status: Home / Self Care | Payer: Medicare Other | Source: Ambulatory Visit | Attending: Family Medicine | Admitting: Family Medicine

## 2017-11-15 VITALS — BP 150/60 | HR 65 | Temp 98.3°F | Ht 66.75 in | Wt 249.2 lb

## 2017-11-15 DIAGNOSIS — M25562 Pain in left knee: Secondary | ICD-10-CM | POA: Diagnosis not present

## 2017-11-15 DIAGNOSIS — M2392 Unspecified internal derangement of left knee: Secondary | ICD-10-CM

## 2017-11-15 DIAGNOSIS — M25462 Effusion, left knee: Secondary | ICD-10-CM | POA: Diagnosis not present

## 2017-11-15 NOTE — Progress Notes (Signed)
Dr. Frederico Hamman T. Shaylea Ucci, MD, [facility] [place], [address] Phone: [phone] Fax: [phone]  11/15/2017  Patient: Cesar Powell, MRN: [id number], DOB: Jan 04, 1931, 84 y.o.  Primary Physician:  Owens Loffler, MD   Chief Complaint  Patient presents with  . Knee Pain    Left x 3 weeks   Subjective:   Cesar Powell is a 84 y.o. very pleasant male patient who presents with the following:  Very pleasant gentleman at age 41 who is generally healthy with the exception of recurrent prostate cancer on Lupron who presents with left-sided knee pain.  He thinks that he twisted his knee, now is having pain walking on his leg, and having a significant amount of pain going up and down stairs.  Is using a cane to take some weight off of that leg, and that is helped quite a bit.  He has used 2 different knee braces, neither which seemed to help all that much, and he got some edema after using them.  He has not tried any oral, oral medications or ice or heat.  He has no significant history of knee trauma, surgical history or fracture.  Past Medical History, Surgical History, Social History, Family History, Problem List, Medications, and Allergies have been reviewed and updated if relevant.  Patient Active Problem List   Diagnosis Date Noted  . Subtrochanteric fracture of femur ([place]) 10/08/2014  . Essential hypertension 09/06/2009  . ERECTILE DYSFUNCTION, MILD 07/25/2007  . METABOLIC SYNDROME X 53/61/4431  . Obesity 06/22/2007  . DIVERTICULOSIS, COLON 06/22/2007  . Obstructive sleep apnea 06/22/2007  . Local recurrence of prostate cancer ([place] [facility]) 06/22/2007  . DIVERTICULITIS, HX OF 06/22/2007    Past Medical History:  Diagnosis Date  . Discoloration of nail   . H/O prostate cancer    was treated with radiation  . Hypertension   . Local recurrence of prostate cancer ([place]) 06/22/2007   Qualifier: Diagnosis of  By: Fuller Plan  CMA (AAMA), Lugene    . Shingles     Past Surgical History:  Procedure Laterality Date  . ABDOMINAL SURGERY     ruptured intestines   . CATARACT EXTRACTION W/PHACO Left 01/29/2016   Procedure: CATARACT EXTRACTION PHACO AND INTRAOCULAR LENS PLACEMENT ([facility]) left eye;  Surgeon: Leandrew Koyanagi, MD;  Location: [facility];  Service: Ophthalmology;  Laterality: Left;  RESTOR LENS  . CATARACT EXTRACTION W/PHACO Right 10/21/2016   Procedure: CATARACT EXTRACTION PHACO AND INTRAOCULAR LENS PLACEMENT ([facility])  Right restor toric lens;  Surgeon: Leandrew Koyanagi, MD;  Location: [facility];  Service: Ophthalmology;  Laterality: Right;  restor toric lens  . CYSTOSCOPY WITH LITHOLAPAXY N/A 08/14/2016   Procedure: CYSTOSCOPY WITH LITHOLAPAXY;  Surgeon: Nickie Retort, MD;  Location: [facility];  Service: Urology;  Laterality: N/A;  . FEMUR IM NAIL Right 10/08/2014   Procedure: INTRAMEDULLARY (IM) NAIL FEMORAL;  Surgeon: Rod Can, MD;  Location: [facility] [place];  Service: Orthopedics;  Laterality: Right;  PER DANIELLE 110 MIN  . HOLMIUM LASER APPLICATION  5/40/0867   Procedure: HOLMIUM LASER APPLICATION;  Surgeon: Nickie Retort, MD;  Location: [facility];  Service: Urology;;  . OTHER SURGICAL HISTORY  2006   Prostate Radiation Surgery     Social History   Socioeconomic History  . Marital status: Married    Spouse name: Not on file  . Number of children: Not on file  . Years of education: Not on file  .  Highest education level: Not on file  Occupational History  . Occupation: retired  Scientific laboratory technician  . Financial resource strain: Not on file  . Food insecurity:    Worry: Not on file    Inability: Not on file  . Transportation needs:    Medical: Not on file    Non-medical: Not on file  Tobacco Use  . Smoking status: Former Smoker    Packs/day: 1.00    Years: 15.00    Pack years: 15.00    Types: Cigarettes    Last attempt to quit: 09/14/1965    Years since quitting: 52.2  .  Smokeless tobacco: Never Used  Substance and Sexual Activity  . Alcohol use: Yes    Alcohol/week: 7.2 oz    Types: 7 Standard drinks or equivalent, 5 Shots of liquor per week  . Drug use: No  . Sexual activity: Not on file  Lifestyle  . Physical activity:    Days per week: Not on file    Minutes per session: Not on file  . Stress: Not on file  Relationships  . Social connections:    Talks on phone: Not on file    Gets together: Not on file    Attends religious service: Not on file    Active member of club or organization: Not on file    Attends meetings of clubs or organizations: Not on file    Relationship status: Not on file  . Intimate partner violence:    Fear of current or ex partner: Not on file    Emotionally abused: Not on file    Physically abused: Not on file    Forced sexual activity: Not on file  Other Topics Concern  . Not on file  Social History Narrative  . Not on file    Family History  Problem Relation Age of Onset  . Heart disease Brother   . Heart attack Brother   . Prostate cancer Neg Hx   . Bladder Cancer Neg Hx   . Kidney cancer Neg Hx     No Known Allergies  Medication list reviewed and updated in full in [facility].  GEN: No fevers, chills. Nontoxic. Primarily MSK c/o today. MSK: Detailed in the HPI GI: tolerating PO intake without difficulty Neuro: No numbness, parasthesias, or tingling associated. Otherwise the pertinent positives of the ROS are noted above.   Objective:   BP (!) 150/60   Pulse 65   Temp 98.3 F (36.8 C) (Oral)   Ht 5' 6.75" (1.695 m)   Wt 249 lb 4 oz (113.1 kg)   BMI 39.33 kg/m    GEN: WDWN, NAD, Non-toxic, Alert & Oriented x 3 HEENT: Atraumatic, Normocephalic.  Ears and Nose: No external deformity. EXTR: No clubbing/cyanosis/edema NEURO: antalgic gait PSYCH: Normally interactive. Conversant. Not depressed or anxious appearing.  Calm demeanor.    Left knee: Full extension, flexion to 115 degrees.   There is a minimal effusion.  He has medial joint line tenderness.  No significant lateral joint line tenderness.  No significant tenderness out anteriorly including no tenderness at the patella or quadriceps tendon.  ACL, PCL, MCL, and LCL are all stable and appear intact.  Flexion pinch causes some pain.  McMurray's causes pain without mechanical symptoms.  Radiology: Dg Knee Complete 4 Views Left  Result Date: 11/15/2017 CLINICAL DATA:  Acute knee pain for 3 weeks. EXAM: LEFT KNEE - COMPLETE 4+ VIEW COMPARISON:  None. FINDINGS: No evidence of fracture or dislocation.  Small knee joint effusion is noted. Enthesopathic changes are seen involving the patella, and soft tissue calcifications in the infrapatellar tendon are consistent with chronic tendinopathy. No evidence of knee joint arthropathy. IMPRESSION: Small knee joint effusion.  No other acute findings. Electronically Signed   By: Earle Gell M.D.   On: 11/15/2017 09:13     Assessment and Plan:   Acute internal derangement of left knee  Acute pain of left knee - Plan: DG Knee Complete 4 Views Left  Most likely mechanism is degenerative meniscal tear.  Aged 87 years, relatively normal knee x-rays.  Likelihood of recovery with conservative treatment is good.  Active at baseline, I am going to have the patient continue riding his bicycle 3 times a week at the gym, and working on some basic strengthening.  He declines to be fitted for another knee brace today.  I think doing this as well as some icing particular actor after activity is a good idea.  Continue with cane.  Add some additional NSAIDs.  We talked about possibly doing a knee injection today, but right now he is going to hold off on this.  Follow-up: Return in about 6 weeks (around 12/27/2017).  Orders Placed This Encounter  Procedures  . DG Knee Complete 4 Views Left    Signed,  Lenoir Facchini T. Lamichael Youkhana, MD   Allergies as of 11/15/2017   No Known Allergies     Medication  List        Accurate as of 11/15/17 10:23 AM. Always use your most recent med list.          cholecalciferol 1000 units tablet Commonly known as:  VITAMIN D Take 1,000 Units by mouth daily.   Fish Oil 1200 MG Caps Take 1,200 mg by mouth daily.   GLUCOSAMINE CHONDR COMPLEX PO Take 2 capsules by mouth daily.   multivitamin capsule Take 1 capsule by mouth daily.   tamsulosin 0.4 MG Caps capsule Commonly known as:  FLOMAX Take 2 capsules (0.8 mg total) by mouth daily.   vitamin C 1000 MG tablet Take 1,000 mg by mouth daily.

## 2017-11-22 ENCOUNTER — Other Ambulatory Visit: Payer: Self-pay | Admitting: Family Medicine

## 2017-11-22 ENCOUNTER — Telehealth: Payer: Self-pay | Admitting: *Deleted

## 2017-11-22 DIAGNOSIS — M2392 Unspecified internal derangement of left knee: Secondary | ICD-10-CM

## 2017-11-22 NOTE — Telephone Encounter (Signed)
Copied from [place] [id number]. Topic: Referral - Request >> Nov 22, 2017  9:38 AM Aurelio Brash B wrote: Reason for CRM: PT is requesting a referral to [facility]   left knee  damage

## 2017-11-22 NOTE — Telephone Encounter (Signed)
done

## 2017-11-24 DIAGNOSIS — M25562 Pain in left knee: Secondary | ICD-10-CM | POA: Diagnosis not present

## 2017-11-24 DIAGNOSIS — M1712 Unilateral primary osteoarthritis, left knee: Secondary | ICD-10-CM | POA: Diagnosis not present

## 2017-12-07 DIAGNOSIS — M5416 Radiculopathy, lumbar region: Secondary | ICD-10-CM | POA: Diagnosis not present

## 2017-12-07 DIAGNOSIS — M9903 Segmental and somatic dysfunction of lumbar region: Secondary | ICD-10-CM | POA: Diagnosis not present

## 2017-12-07 DIAGNOSIS — M955 Acquired deformity of pelvis: Secondary | ICD-10-CM | POA: Diagnosis not present

## 2017-12-07 DIAGNOSIS — M9905 Segmental and somatic dysfunction of pelvic region: Secondary | ICD-10-CM | POA: Diagnosis not present

## 2017-12-14 ENCOUNTER — Other Ambulatory Visit: Payer: Medicare Other

## 2017-12-14 DIAGNOSIS — C61 Malignant neoplasm of prostate: Secondary | ICD-10-CM

## 2017-12-15 LAB — TESTOSTERONE: Testosterone: <3 ng/dL — ABNORMAL LOW (ref 264–916)

## 2017-12-15 LAB — PSA: Prostate Specific Ag, Serum: 1.5 ng/mL (ref 0.0–4.0)

## 2017-12-16 ENCOUNTER — Encounter: Payer: Self-pay | Admitting: Urology

## 2017-12-16 ENCOUNTER — Ambulatory Visit (INDEPENDENT_AMBULATORY_CARE_PROVIDER_SITE_OTHER): Payer: Medicare Other | Admitting: Urology

## 2017-12-16 VITALS — BP 167/67 | HR 69 | Ht 68.0 in | Wt 245.0 lb

## 2017-12-16 DIAGNOSIS — C61 Malignant neoplasm of prostate: Secondary | ICD-10-CM | POA: Diagnosis not present

## 2017-12-16 MED ORDER — LEUPROLIDE ACETATE (3 MONTH) 22.5 MG IM KIT
22.5000 mg | PACK | Freq: Once | INTRAMUSCULAR | Status: AC
  Administered 2017-12-16: 22.5 mg via INTRAMUSCULAR

## 2017-12-16 NOTE — Progress Notes (Signed)
Lupron IM Injection   Due to Prostate Cancer patient is present today for a Lupron Injection.  Medication: Lupron 3 month Dose: 22.5 mg  Location: right upper outer buttocks Lot: [id number] Exp: 04/30/2018  Patient tolerated well, no complications were noted  Performed by: Fonnie Jarvis, CMA  Follow up: 3 month

## 2017-12-16 NOTE — Progress Notes (Signed)
12/16/2017 2:44 PM   Cesar Powell [birth year] [id number]  Referring provider: Owens Loffler, MD [address] [place], [place] [address]  Chief Complaint  Patient presents with  . Prostate Cancer    HPI: The patient is an 84 year old gentleman with a past medical history of biochemical recurrence of prostate cancer treated after treatment with radiation who presents for follow up.  1.Biochemical recurrence of prostate cancer Treated with radiation in 2005. After radiation his PSA ranat approximately 0.03 for a number of years. He was then started on Axironfor low testosterone. His PSA then started to rise eventually to ashigh as 18. His Axiron was stopped at this point, and he was given 1 dose of Lupron. His PSA then fell and had been around 0.4. His PSA rose to 1.9 in March 2018. It was 3.6 in June 2018. He was given his first 3 monthLupron injection on January 14, 2017. He underwent CT and bone scan which showed no evidence of metastatic disease.PSA decreased to 1.7 in October 2018.  His PSA in February 2019 was 1.3.  His PSA in May 2019 was 1.5. Testosterone was < 3.   2. BPH Currently on Flomax. Prior to starting Flomax, he had severe urinary urgency with urge incontinence. At that time, his PVR was 219 cc. After starting Flomax, his symptoms greatly improved. LastIPSS was 11/2.  3. Gross hematuria Patient with gross hematuria for 18 months. Work up revealed bladder stone which was treated with cystolitholapaxy.  4. Bladder stone The patient underwent cystoscopy litholapaxy in January 2018. He did not undergo a TURP at that time because he was not on medication when the bladder stone formed and once medication started his PVR was low and his symptoms resolved. His bladder stone was 35% calcium oxalate dihydrate, 30% calcium oxalate monohydrate, and 35% calcium phosphate.       PMH: Past Medical History:  Diagnosis Date  . Discoloration of nail   .  H/O prostate cancer    was treated with radiation  . Hypertension   . Local recurrence of prostate cancer ([place]) 06/22/2007   Qualifier: Diagnosis of  By: Fuller Plan CMA (AAMA), Lugene    . Shingles     Surgical History: Past Surgical History:  Procedure Laterality Date  . ABDOMINAL SURGERY     ruptured intestines   . CATARACT EXTRACTION W/PHACO Left 01/29/2016   Procedure: CATARACT EXTRACTION PHACO AND INTRAOCULAR LENS PLACEMENT ([place]) left eye;  Surgeon: Leandrew Koyanagi, MD;  Location: [place];  Service: Ophthalmology;  Laterality: Left;  RESTOR LENS  . CATARACT EXTRACTION W/PHACO Right 10/21/2016   Procedure: CATARACT EXTRACTION PHACO AND INTRAOCULAR LENS PLACEMENT (IOC)  Right restor toric lens;  Surgeon: Leandrew Koyanagi, MD;  Location: [facility];  Service: Ophthalmology;  Laterality: Right;  restor toric lens  . CYSTOSCOPY WITH LITHOLAPAXY N/A 08/14/2016   Procedure: CYSTOSCOPY WITH LITHOLAPAXY;  Surgeon: Nickie Retort, MD;  Location: [facility];  Service: Urology;  Laterality: N/A;  . FEMUR IM NAIL Right 10/08/2014   Procedure: INTRAMEDULLARY (IM) NAIL FEMORAL;  Surgeon: Rod Can, MD;  Location: [facility];  Service: Orthopedics;  Laterality: Right;  PER DANIELLE 110 MIN  . HOLMIUM LASER APPLICATION  1/91/4782   Procedure: HOLMIUM LASER APPLICATION;  Surgeon: Nickie Retort, MD;  Location: [facility];  Service: Urology;;  . OTHER SURGICAL HISTORY  2006   Prostate Radiation Surgery     Home Medications:  Allergies as of 12/16/2017   No Known Allergies  Medication List        Accurate as of 12/16/17  2:44 PM. Always use your most recent med list.          cholecalciferol 1000 units tablet Commonly known as:  VITAMIN D Take 1,000 Units by mouth daily.   Fish Oil 1200 MG Caps Take 1,200 mg by mouth daily.   GLUCOSAMINE CHONDR COMPLEX PO Take 2 capsules by mouth daily.   multivitamin capsule Take 1 capsule by mouth daily.   tamsulosin  0.4 MG Caps capsule Commonly known as:  FLOMAX Take 2 capsules (0.8 mg total) by mouth daily.   vitamin C 1000 MG tablet Take 1,000 mg by mouth daily.       Allergies: No Known Allergies  Family History: Family History  Problem Relation Age of Onset  . Heart disease Brother   . Heart attack Brother   . Prostate cancer Neg Hx   . Bladder Cancer Neg Hx   . Kidney cancer Neg Hx     Social History:  reports that he quit smoking about 52 years ago. His smoking use included cigarettes. He has a 15.00 pack-year smoking history. He has never used smokeless tobacco. He reports that he drinks about 7.2 oz of alcohol per week. He reports that he does not use drugs.  ROS: UROLOGY Frequent Urination?: No Hard to postpone urination?: Yes Burning/pain with urination?: No Get up at night to urinate?: Yes Leakage of urine?: No Urine stream starts and stops?: No Trouble starting stream?: No Do you have to strain to urinate?: No Blood in urine?: No Urinary tract infection?: No Sexually transmitted disease?: No Injury to kidneys or bladder?: No Painful intercourse?: No Weak stream?: No Erection problems?: No Penile pain?: No  Gastrointestinal Nausea?: No Vomiting?: No Indigestion/heartburn?: No Diarrhea?: No Constipation?: No  Constitutional Fever: No Night sweats?: Yes Weight loss?: No Fatigue?: No  Skin Skin rash/lesions?: No Itching?: No  Eyes Blurred vision?: No Double vision?: No  Ears/Nose/Throat Sore throat?: No Sinus problems?: No  Hematologic/Lymphatic Swollen glands?: No Easy bruising?: No  Cardiovascular Leg swelling?: No Chest pain?: No  Respiratory Cough?: No Shortness of breath?: No  Endocrine Excessive thirst?: No  Musculoskeletal Back pain?: No Joint pain?: No  Neurological Headaches?: No Dizziness?: No  Psychologic Depression?: No Anxiety?: No  Physical Exam: BP (!) 167/67   Pulse 69   Ht 5\' 8"  (1.727 m)   Wt 245 lb  (111.1 kg)   BMI 37.25 kg/m   Constitutional:  Alert and oriented, No acute distress. HEENT: [place] AT, moist mucus membranes.  Trachea midline, no masses. Cardiovascular: No clubbing, cyanosis, or edema. Respiratory: Normal respiratory effort, no increased work of breathing. GI: Abdomen is soft, nontender, nondistended, no abdominal masses GU: No CVA tenderness.  Skin: No rashes, bruises or suspicious lesions. Lymph: No cervical or inguinal adenopathy. Neurologic: Grossly intact, no focal deficits, moving all 4 extremities. Psychiatric: Normal mood and affect.  Laboratory Data: Lab Results  Component Value Date   WBC 6.3 06/02/2017   HGB 12.7 (L) 06/02/2017   HCT 38.7 (L) 06/02/2017   MCV 96.1 06/02/2017   PLT 212.0 06/02/2017    Lab Results  Component Value Date   CREATININE 1.13 06/02/2017    Lab Results  Component Value Date   PSA 0.47 10/21/2015   PSA 0.32 08/26/2009   PSA 0.32 12/07/2007    Lab Results  Component Value Date   TESTOSTERONE <3 (L) 12/14/2017    No results found for: HGBA1C  Urinalysis    Component Value Date/Time   COLORURINE lt. yellow 06/13/2007 1105   APPEARANCEUR Cloudy (A) 07/23/2016 1132   LABSPEC <1.005 06/13/2007 1105   PHURINE 6.0 06/13/2007 1105   GLUCOSEU Negative 07/23/2016 1132   HGBUR negative 06/13/2007 1105   BILIRUBINUR Negative 07/23/2016 1132   PROTEINUR Negative 07/23/2016 1132   UROBILINOGEN 0.2 06/01/2016 0953   UROBILINOGEN negative 06/13/2007 1105   NITRITE Negative 07/23/2016 1132   NITRITE negative 06/13/2007 1105   LEUKOCYTESUR Negative 07/23/2016 1132    Assessment & Plan:    1. Biochemical recurrence of prostate cancer -Due to the slight rise in PSA as well as patient preference, we will continue androgen deprivation therapy with 3 months of Lupron shots.  He was given this injection today.  He will follow-up in 3 months for repeat Lupron injection with PSA and testosterone prior.  He has had elevation of  his PSA after radiation therapy in the past when he was taking exogenous testosterone that responded well toandrogendeprivation therapy. He then went many years without elevation in his PSA. We will continue him on ADTfor the time being. However, he may be a candidate for intermittent androgen deprivation therapy down the road given this history if his PSA is better supressed.He is extremely interested in this in the future if possible. He will continue his calcium and vitamin D.  Return in about 3 months (around 03/18/2018) for PSA/T prior.  Nickie Retort, MD  [facility] [address] [place], [place] [address] [phone]

## 2017-12-22 DIAGNOSIS — M1712 Unilateral primary osteoarthritis, left knee: Secondary | ICD-10-CM | POA: Diagnosis not present

## 2017-12-22 DIAGNOSIS — M7052 Other bursitis of knee, left knee: Secondary | ICD-10-CM | POA: Diagnosis not present

## 2017-12-29 ENCOUNTER — Ambulatory Visit (INDEPENDENT_AMBULATORY_CARE_PROVIDER_SITE_OTHER): Payer: Medicare Other | Admitting: Family Medicine

## 2017-12-29 ENCOUNTER — Encounter: Payer: Self-pay | Admitting: Family Medicine

## 2017-12-29 VITALS — BP 138/60 | HR 66 | Temp 97.6°F | Ht 66.75 in | Wt 249.0 lb

## 2017-12-29 DIAGNOSIS — M25562 Pain in left knee: Secondary | ICD-10-CM | POA: Diagnosis not present

## 2017-12-29 DIAGNOSIS — M2392 Unspecified internal derangement of left knee: Secondary | ICD-10-CM

## 2017-12-29 NOTE — Progress Notes (Signed)
Dr. Frederico Hamman T. Khayri Kargbo, MD, [facility] [place], [address] Phone: [phone] Fax: [phone]  12/29/2017  Patient: Cesar Powell, MRN: [id number], DOB: 03/15/1931, 84 y.o.  Primary Physician:  Owens Loffler, MD   Chief Complaint  Patient presents with  . Follow-up    Left Knee   Subjective:   Cesar Powell is a 84 y.o. very pleasant male patient who presents with the following:  F/u L knee, has been seeing Dr. Lyla Glassing x 2.  He is doing much better now, he has been able to rehab his knee at the [facility], and he has been taking some occasional NSAIDs.  He also had a intra-articular knee injection x1.  Doing some bike.   Past Medical History, Surgical History, Social History, Family History, Problem List, Medications, and Allergies have been reviewed and updated if relevant.  Patient Active Problem List   Diagnosis Date Noted  . Subtrochanteric fracture of femur ([place]) 10/08/2014  . Essential hypertension 09/06/2009  . ERECTILE DYSFUNCTION, MILD 07/25/2007  . METABOLIC SYNDROME X 67/07/4579  . Obesity 06/22/2007  . DIVERTICULOSIS, COLON 06/22/2007  . Obstructive sleep apnea 06/22/2007  . Local recurrence of prostate cancer ([place]) 06/22/2007  . DIVERTICULITIS, HX OF 06/22/2007    Past Medical History:  Diagnosis Date  . Discoloration of nail   . H/O prostate cancer    was treated with radiation  . Hypertension   . Local recurrence of prostate cancer ([place]) 06/22/2007   Qualifier: Diagnosis of  By: Fuller Plan CMA (AAMA), Lugene    . Shingles     Past Surgical History:  Procedure Laterality Date  . ABDOMINAL SURGERY     ruptured intestines   . CATARACT EXTRACTION W/PHACO Left 01/29/2016   Procedure: CATARACT EXTRACTION PHACO AND INTRAOCULAR LENS PLACEMENT ([place]) left eye;  Surgeon: Leandrew Koyanagi, MD;  Location: [place];  Service: Ophthalmology;  Laterality: Left;  RESTOR LENS  . CATARACT  EXTRACTION W/PHACO Right 10/21/2016   Procedure: CATARACT EXTRACTION PHACO AND INTRAOCULAR LENS PLACEMENT (IOC)  Right restor toric lens;  Surgeon: Leandrew Koyanagi, MD;  Location: [place];  Service: Ophthalmology;  Laterality: Right;  restor toric lens  . CYSTOSCOPY WITH LITHOLAPAXY N/A 08/14/2016   Procedure: CYSTOSCOPY WITH LITHOLAPAXY;  Surgeon: Nickie Retort, MD;  Location: [facility];  Service: Urology;  Laterality: N/A;  . FEMUR IM NAIL Right 10/08/2014   Procedure: INTRAMEDULLARY (IM) NAIL FEMORAL;  Surgeon: Rod Can, MD;  Location: [place];  Service: Orthopedics;  Laterality: Right;  PER DANIELLE 110 MIN  . HOLMIUM LASER APPLICATION  9/98/3382   Procedure: HOLMIUM LASER APPLICATION;  Surgeon: Nickie Retort, MD;  Location: [facility];  Service: Urology;;  . OTHER SURGICAL HISTORY  2006   Prostate Radiation Surgery     Social History   Socioeconomic History  . Marital status: Married    Spouse name: Not on file  . Number of children: Not on file  . Years of education: Not on file  . Highest education level: Not on file  Occupational History  . Occupation: retired  Scientific laboratory technician  . Financial resource strain: Not on file  . Food insecurity:    Worry: Not on file    Inability: Not on file  . Transportation needs:    Medical: Not on file    Non-medical: Not on file  Tobacco Use  . Smoking status: Former Smoker    Packs/day: 1.00  Years: 15.00    Pack years: 15.00    Types: Cigarettes    Last attempt to quit: 09/14/1965    Years since quitting: 52.3  . Smokeless tobacco: Never Used  Substance and Sexual Activity  . Alcohol use: Yes    Alcohol/week: 7.2 oz    Types: 7 Standard drinks or equivalent, 5 Shots of liquor per week  . Drug use: No  . Sexual activity: Not on file  Lifestyle  . Physical activity:    Days per week: Not on file    Minutes per session: Not on file  . Stress: Not on file  Relationships  . Social connections:    Talks on  phone: Not on file    Gets together: Not on file    Attends religious service: Not on file    Active member of club or organization: Not on file    Attends meetings of clubs or organizations: Not on file    Relationship status: Not on file  . Intimate partner violence:    Fear of current or ex partner: Not on file    Emotionally abused: Not on file    Physically abused: Not on file    Forced sexual activity: Not on file  Other Topics Concern  . Not on file  Social History Narrative  . Not on file    Family History  Problem Relation Age of Onset  . Heart disease Brother   . Heart attack Brother   . Prostate cancer Neg Hx   . Bladder Cancer Neg Hx   . Kidney cancer Neg Hx     No Known Allergies  Medication list reviewed and updated in full in [place].   GEN: No acute illnesses, no fevers, chills. GI: No n/v/d, eating normally Pulm: No SOB Interactive and getting along well at home.  Otherwise, ROS is as per the HPI.  Objective:   BP 138/60   Pulse 66   Temp 97.6 F (36.4 C) (Oral)   Ht 5' 6.75" (1.695 m)   Wt 249 lb (112.9 kg)   BMI 39.29 kg/m   GEN: WDWN, NAD, Non-toxic, A & O x 3 HEENT: Atraumatic, Normocephalic. Neck supple. No masses, No LAD. Ears and Nose: No external deformity. EXTR: No c/c/e NEURO Normal gait.  PSYCH: Normally interactive. Conversant. Not depressed or anxious appearing.  Calm demeanor.   Knee:  L Gait: Normal heel toe pattern ROM: 0-115 Effusion: neg Echymosis or edema: none Patellar tendon NT Painful PLICA: neg Patellar grind: negative Medial and lateral patellar facet loading: negative medial and lateral joint lines:NT Mcmurray's neg Flexion-pinch neg Varus and valgus stress: stable Lachman: neg Ant and Post drawer: neg Hip abduction, IR, ER: WNL Hip flexion str: 5/5 Hip abd: 5/5 Quad: 5/5 VMO atrophy:No Hamstring concentric and eccentric: 5/5   Laboratory and Imaging Data:  Assessment and Plan:   Acute  internal derangement of left knee  Acute pain of left knee  Doing much better, cont weight program and HEP for knee  Follow-up: No follow-ups on file.  Signed,  Maud Deed. Cresencio Reesor, MD   Allergies as of 12/29/2017   No Known Allergies     Medication List        Accurate as of 12/29/17  8:30 AM. Always use your most recent med list.          cholecalciferol 1000 units tablet Commonly known as:  VITAMIN D Take 1,000 Units by mouth daily.   Fish Oil  1200 MG Caps Take 1,200 mg by mouth daily.   GLUCOSAMINE CHONDR COMPLEX PO Take 2 capsules by mouth daily.   multivitamin capsule Take 1 capsule by mouth daily.   tamsulosin 0.4 MG Caps capsule Commonly known as:  FLOMAX Take 2 capsules (0.8 mg total) by mouth daily.   vitamin C 1000 MG tablet Take 1,000 mg by mouth daily.

## 2018-01-04 DIAGNOSIS — M9903 Segmental and somatic dysfunction of lumbar region: Secondary | ICD-10-CM | POA: Diagnosis not present

## 2018-01-04 DIAGNOSIS — M5416 Radiculopathy, lumbar region: Secondary | ICD-10-CM | POA: Diagnosis not present

## 2018-01-04 DIAGNOSIS — M9905 Segmental and somatic dysfunction of pelvic region: Secondary | ICD-10-CM | POA: Diagnosis not present

## 2018-01-04 DIAGNOSIS — M955 Acquired deformity of pelvis: Secondary | ICD-10-CM | POA: Diagnosis not present

## 2018-02-01 DIAGNOSIS — M9903 Segmental and somatic dysfunction of lumbar region: Secondary | ICD-10-CM | POA: Diagnosis not present

## 2018-02-01 DIAGNOSIS — M9905 Segmental and somatic dysfunction of pelvic region: Secondary | ICD-10-CM | POA: Diagnosis not present

## 2018-02-01 DIAGNOSIS — M5416 Radiculopathy, lumbar region: Secondary | ICD-10-CM | POA: Diagnosis not present

## 2018-02-01 DIAGNOSIS — M955 Acquired deformity of pelvis: Secondary | ICD-10-CM | POA: Diagnosis not present

## 2018-02-07 ENCOUNTER — Other Ambulatory Visit: Payer: Self-pay

## 2018-02-07 DIAGNOSIS — R350 Frequency of micturition: Principal | ICD-10-CM

## 2018-02-07 DIAGNOSIS — N3941 Urge incontinence: Secondary | ICD-10-CM

## 2018-02-07 DIAGNOSIS — C61 Malignant neoplasm of prostate: Secondary | ICD-10-CM

## 2018-02-07 DIAGNOSIS — N401 Enlarged prostate with lower urinary tract symptoms: Secondary | ICD-10-CM

## 2018-02-07 MED ORDER — TAMSULOSIN HCL 0.4 MG PO CAPS: 0.8000 mg | ORAL_CAPSULE | Freq: Every day | ORAL | 11 refills | Status: DC

## 2018-02-07 NOTE — Telephone Encounter (Signed)
Incoming request from pt's pharmacy pt seen within the last year, rx sent.

## 2018-03-08 DIAGNOSIS — M9903 Segmental and somatic dysfunction of lumbar region: Secondary | ICD-10-CM | POA: Diagnosis not present

## 2018-03-08 DIAGNOSIS — M9905 Segmental and somatic dysfunction of pelvic region: Secondary | ICD-10-CM | POA: Diagnosis not present

## 2018-03-08 DIAGNOSIS — M955 Acquired deformity of pelvis: Secondary | ICD-10-CM | POA: Diagnosis not present

## 2018-03-08 DIAGNOSIS — M5416 Radiculopathy, lumbar region: Secondary | ICD-10-CM | POA: Diagnosis not present

## 2018-03-15 ENCOUNTER — Other Ambulatory Visit: Payer: Medicare Other

## 2018-03-15 DIAGNOSIS — C61 Malignant neoplasm of prostate: Secondary | ICD-10-CM

## 2018-03-16 LAB — TESTOSTERONE: Testosterone: <3 ng/dL — ABNORMAL LOW (ref 264–916)

## 2018-03-16 LAB — PSA: Prostate Specific Ag, Serum: 2 ng/mL (ref 0.0–4.0)

## 2018-03-18 ENCOUNTER — Ambulatory Visit: Payer: Medicare Other

## 2018-03-21 ENCOUNTER — Ambulatory Visit: Payer: Medicare Other

## 2018-03-22 ENCOUNTER — Ambulatory Visit (INDEPENDENT_AMBULATORY_CARE_PROVIDER_SITE_OTHER): Payer: Medicare Other | Admitting: Urology

## 2018-03-22 ENCOUNTER — Encounter: Payer: Self-pay | Admitting: Urology

## 2018-03-22 VITALS — BP 168/78 | HR 62 | Ht 66.75 in | Wt 246.0 lb

## 2018-03-22 DIAGNOSIS — C61 Malignant neoplasm of prostate: Secondary | ICD-10-CM

## 2018-03-22 DIAGNOSIS — R399 Unspecified symptoms and signs involving the genitourinary system: Secondary | ICD-10-CM | POA: Diagnosis not present

## 2018-03-22 MED ORDER — LEUPROLIDE ACETATE (6 MONTH) 45 MG IM KIT
45.0000 mg | PACK | Freq: Once | INTRAMUSCULAR | Status: AC
  Administered 2018-03-22: 45 mg via INTRAMUSCULAR

## 2018-03-22 NOTE — Progress Notes (Signed)
Lupron IM Injection   Due to Prostate Cancer patient is present today for a Lupron Injection.  Medication: Lupron 6 month Dose: 45 mg  Location: right upper outer buttocks Lot: [id number] Exp: 06/25/2020  Patient tolerated well, no complications were noted  Performed by: Elberta Leatherwood, CMA  Follow up: 6 month

## 2018-03-22 NOTE — Progress Notes (Signed)
   03/22/2018 4:23 PM   YANNICK STEUBER 1931/02/14 [id number]  Reason for visit: Follow up biochemical recurrence prostate cancer  HPI: At the pleasure of seeing Mr. Walston in urology clinic today for follow-up of biochemical recurrence after prostate cancer.  Briefly, he is an 84 year old male diagnosed with prostate cancer in 2005 treated with radiation.  His past records are not clear, however it appears that he was started on testosterone supplementation at some point at which point his PSA rose to as high as 18.  At that point testosterone supplementation was stopped, and he was started on Lupron.  PSA was as low as 0.4 on Lupron.  PSA has slowly risen and is now 2 today from 1.5 previously.  Testosterone has been undetectable on Lupron.  Additionally, he has some lower urinary tract symptoms and a history of cystolitholapaxy.  He reports his urinary symptoms are well controlled on Flomax currently.  He also notes a history of right femur fracture and has been on vitamin D and calcium supplementation since that time   ROS: Please see flowsheet from today's date for complete review of systems.  Physical Exam: BP (!) 168/78 (BP Location: Left Arm, Patient Position: Sitting, Cuff Size: Normal)   Pulse 62   Ht 5' 6.75" (1.695 m)   Wt 246 lb (111.6 kg)   BMI 38.82 kg/m   Constitutional:  Alert and oriented, No acute distress. Respiratory: Normal respiratory effort, no increased work of breathing. GI: Abdomen is soft, nontender, nondistended, no abdominal masses Skin: No rashes, bruises or suspicious lesions. Neurologic: Grossly intact, no focal deficits, moving all 4 extremities. Psychiatric: Normal mood and affect  Laboratory Data: PSA 2(1.5) T: <3  Pertinent Imaging: I personally reviewed the bone scan and CT abdomen pelvis from July 2018, no metastatic disease seen.  Assessment & Plan:   In summary, Mr. Faison is an 84 year old male with a history of biochemical recurrence  after radiation in 2005.  He now has a rising PSA to 2 from as low as 0.4 on Lupron with undetectable testosterone.  His urinary symptoms are currently well controlled.  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  We will refer to medical oncology, and defer to them if they would like further imaging.  We briefly discussed that medical options may include apalutamide, enzalutamide, observation, or abiraterone.   I also recommended he consider a DEXA scan for osteoporosis with his primary care physician in the setting of ADT and prior fracture.  He was given a 17-month Lupron injection today.   Return in about 6 months (around 09/22/2018) for RTC with PSA prior.  [facility] [place], [place] [address] [place], [place] [address] [phone]

## 2018-03-25 ENCOUNTER — Telehealth: Payer: Self-pay | Admitting: Pharmacy Technician

## 2018-03-25 ENCOUNTER — Inpatient Hospital Stay: Payer: Medicare Other | Attending: Oncology | Admitting: Oncology

## 2018-03-25 ENCOUNTER — Telehealth: Payer: Self-pay | Admitting: Pharmacist

## 2018-03-25 ENCOUNTER — Encounter: Payer: Self-pay | Admitting: Oncology

## 2018-03-25 VITALS — BP 188/69 | HR 72 | Temp 97.7°F | Resp 18 | Ht 66.75 in | Wt 243.4 lb

## 2018-03-25 DIAGNOSIS — C61 Malignant neoplasm of prostate: Secondary | ICD-10-CM | POA: Diagnosis not present

## 2018-03-25 DIAGNOSIS — Z923 Personal history of irradiation: Secondary | ICD-10-CM | POA: Diagnosis not present

## 2018-03-25 DIAGNOSIS — Z9189 Other specified personal risk factors, not elsewhere classified: Secondary | ICD-10-CM

## 2018-03-25 DIAGNOSIS — R5383 Other fatigue: Secondary | ICD-10-CM

## 2018-03-25 DIAGNOSIS — Z79899 Other long term (current) drug therapy: Secondary | ICD-10-CM

## 2018-03-25 DIAGNOSIS — Z8781 Personal history of (healed) traumatic fracture: Secondary | ICD-10-CM

## 2018-03-25 DIAGNOSIS — I10 Essential (primary) hypertension: Secondary | ICD-10-CM | POA: Insufficient documentation

## 2018-03-25 DIAGNOSIS — Z87891 Personal history of nicotine dependence: Secondary | ICD-10-CM

## 2018-03-25 DIAGNOSIS — E669 Obesity, unspecified: Secondary | ICD-10-CM | POA: Diagnosis not present

## 2018-03-25 DIAGNOSIS — Z79818 Long term (current) use of other agents affecting estrogen receptors and estrogen levels: Secondary | ICD-10-CM

## 2018-03-25 DIAGNOSIS — E291 Testicular hypofunction: Secondary | ICD-10-CM

## 2018-03-25 DIAGNOSIS — G4733 Obstructive sleep apnea (adult) (pediatric): Secondary | ICD-10-CM | POA: Diagnosis not present

## 2018-03-25 NOTE — Telephone Encounter (Signed)
Oral Oncology Patient Advocate Encounter  Prior Authorization for Cesar Powell has been approved.    PA# [id number] Effective dates: 12/25/2017 through 03/24/2021  Oral Oncology Clinic will continue to follow.   [place] [name] Phone [phone] Fax [phone] 03/25/2018 2:25 PM

## 2018-03-25 NOTE — Telephone Encounter (Signed)
Oral Oncology Patient Advocate Encounter  Received notification from [facility] that prior authorization for [place] is required.  PA submitted on CoverMyMeds Key [id number] Status is pending  Oral Oncology Clinic will continue to follow.  [place] Patient [place] Phone [phone] Fax [phone] 03/25/2018 2:24 PM

## 2018-03-25 NOTE — Telephone Encounter (Signed)
Oral Oncology Pharmacist Encounter  Received new prescription for Erleada (apalutamide) for the treatment of non-metastatic castration-resistant prostate cancer in conjunction with Lupron, planned duration until disease progression or unacceptable drug toxicity.  BP from today elevated. He will not start his Erleada until imaging is done and he is seen in the office again. Recommend monitoring his BP carefully, he may benefit from the addition of an anti-hypertensive.  Prescription dose and frequency assessed.   Current medication list in Epic reviewed, one DDIs with Erleada identified: -Erleada may increase the metabolism of tamsulosin. Monitor patient for decreased effectiveness of his tamsulosin.  Prescription has been e-scribed to the [facility] for benefits analysis and approval.  Patient education Counseled patient following his OV on administration, dosing, side effects, monitoring, drug-food interactions, safe handling, storage, and disposal. Patient will take 240 mg orally once daily.  Side effects include but not limited to: fatigue, hypertension.    Reviewed with patient importance of keeping a medication schedule and plan for any missed doses.  Cesar Powell voiced understanding and appreciation. All questions answered. Medication handout provided and consent obtained.  Oral Oncology Clinic will continue to follow for insurance authorization, copayment issues, and start date.  Provided patient with [facility] [place] [facility] phone number. Patient knows to call the office with questions or concerns. Oral Chemotherapy Navigation Clinic will continue to follow.  Darl Pikes, PharmD, BCPS, BCOP Hematology/Oncology Clinical Pharmacist [facility] [phone]  03/25/2018 2:12 PM

## 2018-03-25 NOTE — Telephone Encounter (Signed)
Oral Oncology Patient Advocate Encounter  Patient has been approved for copay assistance with [employer] [facility] (TAF).  The [place] will cover all copayment expenses for Erleada for the remainder of the calendar year.    The billing information is as follows and has been shared with [place].   BIN: [id number] PCN: [id number] Member ID: [id number] Group ID: [place] Patient [place] Phone [phone] Fax [phone] 03/25/2018 2:30 PM

## 2018-03-28 ENCOUNTER — Encounter: Payer: Self-pay | Admitting: Oncology

## 2018-03-28 NOTE — Progress Notes (Signed)
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 Diamantina Providence  Date of visit: 03/28/18   History of presenting illness-patient is a 84 year old gentleman with no significant past medical problems.  He has a history of prostate cancer that was diagnosed and treated with radiation in 2005.  At that point he his PSA was about 0.03 for a long time.  Then started on Axiron for low testosterone.  PSA eventually went up to as high as 18 2015.  At that point he was started on Lupron his PSA trend since then has been as follows:  Component     Latest Ref Rng & Units 10/08/2016 01/12/2017 05/21/2017 09/09/2017  Prostate Specific Ag, Serum     0.0 - 4.0 ng/mL 1.9 3.6 1.7 1.3   Component     Latest Ref Rng & Units 12/14/2017 03/15/2018  Prostate Specific Ag, Serum     0.0 - 4.0 ng/mL 1.5 2.0    Most recently since October 2018 after his PSA went down from 3.6-1.7 and has been gradually increasing to 1.3,1.5 and 2 indicating a PSA doubling time of 9.9 months.  Testosterone levels continue to be suppressed.  Patient is tolerating his Lupron well except for some mild fatigue and hot flashes.  He is fit and continues to exercise.  He denies any abdominal pain, change in appetite or weight loss.  ECOG PS- 0  Pain scale- 0   Review of systems- Review of Systems  Constitutional: Negative for chills, fever and weight loss.  HENT: Negative for congestion, ear discharge and nosebleeds.   Eyes: Negative for blurred vision.  Respiratory: Negative for cough, hemoptysis, sputum production, shortness of breath and wheezing.     Cardiovascular: Negative for chest pain, palpitations, orthopnea and claudication.  Gastrointestinal: Negative for abdominal pain, blood in stool, constipation, diarrhea, heartburn, melena, nausea and vomiting.  Genitourinary: Negative for dysuria, flank pain, frequency, hematuria and urgency.  Musculoskeletal: Negative for back pain, joint pain and myalgias.  Skin: Negative for rash.  Neurological: Negative for dizziness, tingling, focal weakness, seizures, weakness and headaches.  Endo/Heme/Allergies: Does not bruise/bleed easily.       Hot flashes  Psychiatric/Behavioral: Negative for depression and suicidal ideas. The patient does not have insomnia.     No Known Allergies  Patient Active Problem List   Diagnosis Date Noted  . Subtrochanteric fracture of femur ([place]) 10/08/2014  . Essential hypertension 09/06/2009  . ERECTILE DYSFUNCTION, MILD 07/25/2007  . METABOLIC SYNDROME X 32/67/1245  . Obesity 06/22/2007  . DIVERTICULOSIS, COLON 06/22/2007  . Obstructive sleep apnea 06/22/2007  . Local recurrence of prostate cancer ([place]) 06/22/2007  . DIVERTICULITIS, HX OF 06/22/2007     Past Medical History:  Diagnosis Date  . Discoloration of nail   . H/O prostate cancer    was treated with radiation  . Hypertension   . Local recurrence of prostate cancer ([place]) 06/22/2007   Qualifier: Diagnosis of  By: Fuller Plan CMA (AAMA), [place]    . Shingles      Past Surgical History:  Procedure Laterality Date  . ABDOMINAL SURGERY     ruptured intestines   . CATARACT EXTRACTION W/PHACO Left 01/29/2016  Procedure: CATARACT EXTRACTION PHACO AND INTRAOCULAR LENS PLACEMENT ([place]) left eye;  Surgeon: Leandrew Koyanagi, MD;  Location: [place];  Service: Ophthalmology;  Laterality: Left;  RESTOR LENS  . CATARACT EXTRACTION W/PHACO Right 10/21/2016   Procedure: CATARACT EXTRACTION PHACO AND INTRAOCULAR LENS PLACEMENT ([facility])  Right restor toric lens;  Surgeon: Leandrew Koyanagi, MD;   Location: [place];  Service: Ophthalmology;  Laterality: Right;  restor toric lens  . CYSTOSCOPY WITH LITHOLAPAXY N/A 08/14/2016   Procedure: CYSTOSCOPY WITH LITHOLAPAXY;  Surgeon: Nickie Retort, MD;  Location: [facility];  Service: Urology;  Laterality: N/A;  . FEMUR IM NAIL Right 10/08/2014   Procedure: INTRAMEDULLARY (IM) NAIL FEMORAL;  Surgeon: Rod Can, MD;  Location: [place];  Service: Orthopedics;  Laterality: Right;  PER DANIELLE 110 MIN  . HOLMIUM LASER APPLICATION  2/99/3716   Procedure: HOLMIUM LASER APPLICATION;  Surgeon: Nickie Retort, MD;  Location: [facility];  Service: Urology;;  . OTHER SURGICAL HISTORY  2006   Prostate Radiation Surgery     Social History   Socioeconomic History  . Marital status: Married    Spouse name: Not on file  . Number of children: Not on file  . Years of education: Not on file  . Highest education level: Not on file  Occupational History  . Occupation: retired  Scientific laboratory technician  . Financial resource strain: Not on file  . Food insecurity:    Worry: Not on file    Inability: Not on file  . Transportation needs:    Medical: Not on file    Non-medical: Not on file  Tobacco Use  . Smoking status: Former Smoker    Packs/day: 1.00    Years: 15.00    Pack years: 15.00    Types: Cigarettes    Last attempt to quit: 09/14/1965    Years since quitting: 52.5  . Smokeless tobacco: Never Used  Substance and Sexual Activity  . Alcohol use: Yes    Alcohol/week: 12.0 standard drinks    Types: 7 Standard drinks or equivalent, 5 Shots of liquor per week  . Drug use: No  . Sexual activity: Not on file  Lifestyle  . Physical activity:    Days per week: Not on file    Minutes per session: Not on file  . Stress: Not on file  Relationships  . Social connections:    Talks on phone: Not on file    Gets together: Not on file    Attends religious service: Not on file    Active member of club or organization: Not on file    Attends  meetings of clubs or organizations: Not on file    Relationship status: Not on file  . Intimate partner violence:    Fear of current or ex partner: Not on file    Emotionally abused: Not on file    Physically abused: Not on file    Forced sexual activity: Not on file  Other Topics Concern  . Not on file  Social History Narrative  . Not on file     Family History  Problem Relation Age of Onset  . Heart disease Brother   . Heart attack Brother   . Prostate cancer Neg Hx   . Bladder Cancer Neg Hx   . Kidney cancer Neg Hx      Current Outpatient Medications:  .  Ascorbic Acid (VITAMIN C) 1000 MG tablet, Take 1,000 mg by mouth daily., Disp: , Rfl:  .  cholecalciferol (VITAMIN D) 1000 UNITS tablet, Take 1,000 Units by mouth daily., Disp: , Rfl:  .  Glucosamine-Chondroitin (GLUCOSAMINE CHONDR COMPLEX PO), Take 2 capsules by mouth daily., Disp: , Rfl:  .  Multiple Vitamin (MULTIVITAMIN) capsule, Take 1 capsule by mouth daily., Disp: , Rfl:  .  Omega-3 Fatty Acids (FISH OIL) 1200 MG CAPS, Take 1,200 mg by mouth daily., Disp: , Rfl:  .  tamsulosin (FLOMAX) 0.4 MG CAPS capsule, Take 2 capsules (0.8 mg total) by mouth daily., Disp: 60 capsule, Rfl: 11   Physical exam:  Vitals:   03/25/18 1123  BP: (!) 188/69  Pulse: 72  Resp: 18  Temp: 97.7 F (36.5 C)  TempSrc: Tympanic  SpO2: 97%  Weight: 243 lb 6.4 oz (110.4 kg)  Height: 5' 6.75" (1.695 m)   Physical Exam  Constitutional: He is oriented to person, place, and time. He appears well-developed and well-nourished.  HENT:  Head: Normocephalic and atraumatic.  Eyes: Pupils are equal, round, and reactive to light. EOM are normal.  Neck: Normal range of motion.  Cardiovascular: Normal rate, regular rhythm and normal heart sounds.  Pulmonary/Chest: Effort normal and breath sounds normal.  Abdominal: Soft. Bowel sounds are normal.  Neurological: He is alert and oriented to person, place, and time.  Skin: Skin is warm and dry.        CMP Latest Ref Rng & Units 06/02/2017  Glucose 70 - 99 mg/dL 109(H)  BUN 6 - 23 mg/dL 23  Creatinine 0.40 - 1.50 mg/dL 1.13  Sodium 135 - 145 mEq/L 139  Potassium 3.5 - 5.1 mEq/L 4.7  Chloride 96 - 112 mEq/L 102  CO2 19 - 32 mEq/L 29  Calcium 8.4 - 10.5 mg/dL 9.9  Total Protein 6.0 - 8.3 g/dL 7.6  Total Bilirubin 0.2 - 1.2 mg/dL 0.5  Alkaline Phos 39 - 117 U/L 44  AST 0 - 37 U/L 20  ALT 0 - 53 U/L 25   CBC Latest Ref Rng & Units 06/02/2017  WBC 4.0 - 10.5 K/uL 6.3  Hemoglobin 13.0 - 17.0 g/dL 12.7(L)  Hematocrit 39.0 - 52.0 % 38.7(L)  Platelets 150.0 - 400.0 K/uL 212.0    Assessment and plan- Patient is a 84 y.o. male with castrate resistant prostate cancer  I discussed patient's prior prostate cancer history and his recent increase in PSA indicating a PSA doubling time of about 9.9 months.  At this time, I would like to proceed with CT chest abdomen and pelvis as well as bone scan to complete his staging work-up to a certain if he has metastatic disease or not.  If he does not have evidence of metastatic disease- there is a potential role for apalutamide or enzalutamide for nonmetastatic castrate resistant prostate cancer.  Patient was studied in these trials had a PSA doubling time of less than 10 months and Cesar Powell just about meets this criteria.  Although patient is 55 his performance status is 0 and he is independent of his ADLs and IADLs. Observation is also a reasonable consideration at this time.   Discussed the trials that looked at both apalutamide and enzalutamide in this setting which indicated metastases free survival of 40 months and 36 months respectively versus observation which had a metastases free survival of roughly 14 to 16 months.  Discussed that adding further antiandrogen therapy can worsen his side effects such as hot flashes, anemia and fatigue.  Other side effects of apalutamide include all but not limited to skin rash, leg swelling  hypertension and  adverse cardiovascular profile as well as diarrhea.  Patient understands the risks and benefits of proceeding with antiandrogen therapy versus observation and he would like to proceed with definitive therapy at this time pending results of CT scan.  I will tentatively plan to start him on apalutamide 240 mg once daily.  Patient has also met with Despina Hick our oral pharmacist to get started on insurance approval for apalutamide.  I will see him back in 10 days time after his CT scans to discuss further management.  I will also get a bone density scan as apalutamide plus Lupron may further worsen his bone health.  He does not require bisphosphonates for castrate resistant nonmetastatic prostate cancer.  However if he has significant osteopenia and osteoporosis that would be a consideration.   Total face to face encounter time for this patient visit was 45 min. >50% of the time was  spent in counseling and coordination of care.     Thank you for this kind referral and the opportunity to participate in the care of this patient   Visit Diagnosis 1. Prostate cancer ([place])   2. Use of leuprolide acetate (Lupron)   3. At risk for osteopenia   4. Hx of fracture of lower leg   5. High risk medication use     Dr. Randa Evens, MD, MPH [facility] [phone] 03/28/2018  9:34 AM

## 2018-04-05 DIAGNOSIS — M9905 Segmental and somatic dysfunction of pelvic region: Secondary | ICD-10-CM | POA: Diagnosis not present

## 2018-04-05 DIAGNOSIS — M5416 Radiculopathy, lumbar region: Secondary | ICD-10-CM | POA: Diagnosis not present

## 2018-04-05 DIAGNOSIS — M955 Acquired deformity of pelvis: Secondary | ICD-10-CM | POA: Diagnosis not present

## 2018-04-05 DIAGNOSIS — M9903 Segmental and somatic dysfunction of lumbar region: Secondary | ICD-10-CM | POA: Diagnosis not present

## 2018-04-06 ENCOUNTER — Encounter
Admission: RE | Admit: 2018-04-06 | Discharge: 2018-04-06 | Disposition: A | Discharge status: Home / Self Care | Payer: Medicare Other | Source: Ambulatory Visit | Attending: Oncology | Admitting: Oncology

## 2018-04-06 ENCOUNTER — Ambulatory Visit
Admission: RE | Admit: 2018-04-06 | Discharge: 2018-04-06 | Disposition: A | Discharge status: Home / Self Care | Payer: Medicare Other | Source: Ambulatory Visit | Attending: Oncology | Admitting: Oncology

## 2018-04-06 DIAGNOSIS — R9341 Abnormal radiologic findings on diagnostic imaging of renal pelvis, ureter, or bladder: Secondary | ICD-10-CM | POA: Diagnosis not present

## 2018-04-06 DIAGNOSIS — C61 Malignant neoplasm of prostate: Secondary | ICD-10-CM | POA: Insufficient documentation

## 2018-04-06 DIAGNOSIS — K760 Fatty (change of) liver, not elsewhere classified: Secondary | ICD-10-CM | POA: Insufficient documentation

## 2018-04-06 LAB — POCT I-STAT CREATININE: Creatinine, Ser: 1 mg/dL (ref 0.61–1.24)

## 2018-04-06 MED ORDER — TECHNETIUM TC 99M MEDRONATE IV KIT
20.4610 | PACK | Freq: Once | INTRAVENOUS | Status: AC | PRN
  Administered 2018-04-06: 20.461 via INTRAVENOUS

## 2018-04-06 MED ORDER — IOPAMIDOL (ISOVUE-300) INJECTION 61%
100.0000 mL | Freq: Once | INTRAVENOUS | Status: AC | PRN
  Administered 2018-04-06: 100 mL via INTRAVENOUS

## 2018-04-08 ENCOUNTER — Encounter: Payer: Self-pay | Admitting: Oncology

## 2018-04-08 ENCOUNTER — Inpatient Hospital Stay: Payer: Medicare Other | Attending: Oncology

## 2018-04-08 ENCOUNTER — Encounter: Payer: Self-pay | Admitting: Pharmacist

## 2018-04-08 ENCOUNTER — Inpatient Hospital Stay (HOSPITAL_BASED_OUTPATIENT_CLINIC_OR_DEPARTMENT_OTHER): Payer: Medicare Other | Admitting: Oncology

## 2018-04-08 VITALS — BP 171/72 | HR 80 | Temp 98.2°F | Resp 18 | Ht 66.0 in | Wt 246.7 lb

## 2018-04-08 DIAGNOSIS — I10 Essential (primary) hypertension: Secondary | ICD-10-CM | POA: Insufficient documentation

## 2018-04-08 DIAGNOSIS — Z923 Personal history of irradiation: Secondary | ICD-10-CM

## 2018-04-08 DIAGNOSIS — E291 Testicular hypofunction: Secondary | ICD-10-CM | POA: Diagnosis not present

## 2018-04-08 DIAGNOSIS — Z87891 Personal history of nicotine dependence: Secondary | ICD-10-CM | POA: Insufficient documentation

## 2018-04-08 DIAGNOSIS — C61 Malignant neoplasm of prostate: Secondary | ICD-10-CM

## 2018-04-08 DIAGNOSIS — I77811 Abdominal aortic ectasia: Secondary | ICD-10-CM | POA: Insufficient documentation

## 2018-04-08 LAB — CBC WITH DIFFERENTIAL/PLATELET
BASOS PCT: 1 %
Basophils Absolute: 0 10*3/uL (ref 0–0.1)
Eosinophils Absolute: 0.2 10*3/uL (ref 0–0.7)
Eosinophils Relative: 3 %
HCT: 36.2 % — ABNORMAL LOW (ref 40.0–52.0)
HEMOGLOBIN: 12.4 g/dL — AB (ref 13.0–18.0)
Lymphocytes Relative: 19 %
Lymphs Abs: 1.2 10*3/uL (ref 1.0–3.6)
MCH: 31.9 pg (ref 26.0–34.0)
MCHC: 34.3 g/dL (ref 32.0–36.0)
MCV: 93.2 fL (ref 80.0–100.0)
MONOS PCT: 10 %
Monocytes Absolute: 0.6 10*3/uL (ref 0.2–1.0)
Neutro Abs: 4.3 10*3/uL (ref 1.4–6.5)
Neutrophils Relative %: 67 %
Platelets: 219 10*3/uL (ref 150–440)
RBC: 3.88 MIL/uL — ABNORMAL LOW (ref 4.40–5.90)
RDW: 13.8 % (ref 11.5–14.5)
WBC: 6.4 10*3/uL (ref 3.8–10.6)

## 2018-04-08 LAB — COMPREHENSIVE METABOLIC PANEL
ALK PHOS: 46 U/L (ref 38–126)
ALT: 37 U/L (ref 0–44)
ANION GAP: 9 (ref 5–15)
AST: 29 U/L (ref 15–41)
Albumin: 4.5 g/dL (ref 3.5–5.0)
BUN: 22 mg/dL (ref 8–23)
CALCIUM: 9.6 mg/dL (ref 8.9–10.3)
CO2: 26 mmol/L (ref 22–32)
Chloride: 104 mmol/L (ref 98–111)
Creatinine, Ser: 1.09 mg/dL (ref 0.61–1.24)
GFR calc Af Amer: >60 mL/min (ref >60)
GFR calc non Af Amer: 60 mL/min — ABNORMAL LOW (ref >60)
Glucose, Bld: 123 mg/dL — ABNORMAL HIGH (ref 70–99)
Potassium: 4.4 mmol/L (ref 3.5–5.1)
SODIUM: 139 mmol/L (ref 135–145)
Total Bilirubin: 0.6 mg/dL (ref 0.3–1.2)
Total Protein: 7.1 g/dL (ref 6.5–8.1)

## 2018-04-08 LAB — PSA: Prostatic Specific Antigen: 1.89 ng/mL (ref 0.00–4.00)

## 2018-04-08 NOTE — Progress Notes (Signed)
Here to get results of scan 

## 2018-04-08 NOTE — Telephone Encounter (Signed)
Erroneous encounter

## 2018-04-11 NOTE — Progress Notes (Signed)
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  He has a history of prostate cancer that was diagnosed and treated with radiation in 2005.  At that point he his PSA was about 0.03 for a long time.  Then started on Axiron for low testosterone.  PSA eventually went up to as high as 18 2015.  At that point he was started on Lupron his PSA trend since then has been as follows:  Component     Latest Ref Rng & Units 10/08/2016 01/12/2017 05/21/2017 09/09/2017  Prostate Specific Ag, Serum     0.0 - 4.0 ng/mL 1.9 3.6 1.7 1.3   Component     Latest Ref Rng & Units 12/14/2017 03/15/2018  Prostate Specific Ag, Serum     0.0 - 4.0 ng/mL 1.5 2.0    Most recently since October 2018 after his PSA went down from 3.6-1.7 and has been gradually increasing to 1.3,1.5 and 2 indicating a PSA doubling time of 9.9 months.  Testosterone levels continue to be suppressed.  Patient is tolerating his Lupron well except for some mild fatigue and hot flashes.  He is fit and continues to exercise.   Interval history- no new issues since last visit. He is here to discuss CT scan ersults  ECOG PS- 0 Pain scale- 0   Review of systems- Review of Systems  Constitutional: Positive for malaise/fatigue. Negative for chills, fever and weight loss.  HENT: Negative for congestion, ear discharge and nosebleeds.   Eyes: Negative for blurred vision.  Respiratory: Negative  for cough, hemoptysis, sputum production, shortness of breath and wheezing.   Cardiovascular: Negative for chest pain, palpitations, orthopnea and claudication.  Gastrointestinal: Negative for abdominal pain, blood in stool, constipation, diarrhea, heartburn, melena, nausea and vomiting.  Genitourinary: Negative for dysuria, flank pain, frequency, hematuria and urgency.  Musculoskeletal: Negative for back pain, joint pain and myalgias.  Skin: Negative for rash.  Neurological: Negative for dizziness, tingling, focal weakness, seizures, weakness and headaches.  Endo/Heme/Allergies: Does not bruise/bleed easily.       Hot flashes  Psychiatric/Behavioral: Negative for depression and suicidal ideas. The patient does not have insomnia.       No Known Allergies   Past Medical History:  Diagnosis Date  . Discoloration of nail   . H/O prostate cancer    was treated with radiation  . Hypertension   . Local recurrence of prostate cancer ([place]) 06/22/2007   Qualifier: Diagnosis of  By: Fuller Plan CMA (AAMA), [place]    . Shingles      Past Surgical History:  Procedure Laterality Date  . ABDOMINAL SURGERY     ruptured intestines   . CATARACT EXTRACTION W/PHACO Left 01/29/2016   Procedure: CATARACT EXTRACTION PHACO AND INTRAOCULAR LENS PLACEMENT ([place]) left eye;  Surgeon: Leandrew Koyanagi, MD;  Location: [place];  Service: Ophthalmology;  Laterality: Left;  RESTOR LENS  . CATARACT EXTRACTION W/PHACO Right 10/21/2016   Procedure: CATARACT EXTRACTION PHACO AND INTRAOCULAR LENS PLACEMENT ([facility])  Right restor toric lens;  Surgeon: Leandrew Koyanagi, MD;  Location: [place];  Service: Ophthalmology;  Laterality: Right;  restor toric lens  . CYSTOSCOPY WITH LITHOLAPAXY N/A 08/14/2016   Procedure: CYSTOSCOPY WITH LITHOLAPAXY;  Surgeon: Nickie Retort, MD;  Location: [facility];  Service: Urology;  Laterality: N/A;  . FEMUR IM NAIL Right 10/08/2014   Procedure: INTRAMEDULLARY (IM)  NAIL FEMORAL;  Surgeon: Rod Can, MD;  Location: [place];  Service: Orthopedics;  Laterality: Right;  PER DANIELLE 110 MIN  . HOLMIUM LASER APPLICATION  0/03/6760   Procedure: HOLMIUM LASER APPLICATION;  Surgeon: Nickie Retort, MD;  Location: [facility];  Service: Urology;;  . OTHER SURGICAL HISTORY  2006   Prostate Radiation Surgery     Social History   Socioeconomic History  . Marital status: Married    Spouse name: Not on file  . Number of children: Not on file  . Years of education: Not on file  . Highest education level: Not on file  Occupational History  . Occupation: retired  Scientific laboratory technician  . Financial resource strain: Not on file  . Food insecurity:    Worry: Not on file    Inability: Not on file  . Transportation needs:    Medical: Not on file    Non-medical: Not on file  Tobacco Use  . Smoking status: Former Smoker    Packs/day: 1.00    Years: 15.00    Pack years: 15.00    Types: Cigarettes    Last attempt to quit: 09/14/1965    Years since quitting: 52.6  . Smokeless tobacco: Never Used  Substance and Sexual Activity  . Alcohol use: Yes    Alcohol/week: 12.0 standard drinks    Types: 5 Shots of liquor, 7 Standard drinks or equivalent per week  . Drug use: No  . Sexual activity: Not on file  Lifestyle  . Physical activity:    Days per week: Not on file    Minutes per session: Not on file  . Stress: Not on file  Relationships  . Social connections:    Talks on phone: Not on file    Gets together: Not on file    Attends religious service: Not on file    Active member of club or organization: Not on file    Attends meetings of clubs or organizations: Not on file    Relationship status: Not on file  . Intimate partner violence:    Fear of current or ex partner: Not on file    Emotionally abused: Not on file    Physically abused: Not on file    Forced sexual activity: Not on file  Other Topics Concern  . Not on file  Social History Narrative  .  Not on file    Family History  Problem Relation Age of Onset  . Heart disease Brother   . Heart attack Brother   . Prostate cancer Neg Hx   . Bladder Cancer Neg Hx   . Kidney cancer Neg Hx      Current Outpatient Medications:  .  Ascorbic Acid (VITAMIN C) 1000 MG tablet, Take 1,000 mg by mouth daily., Disp: , Rfl:  .  cholecalciferol (VITAMIN D) 1000 UNITS tablet, Take 1,000 Units by mouth daily., Disp: , Rfl:  .  Glucosamine-Chondroitin (GLUCOSAMINE CHONDR COMPLEX PO), Take 2 capsules by mouth daily., Disp: , Rfl:  .  Multiple Vitamin (MULTIVITAMIN) capsule, Take 1 capsule by mouth daily., Disp: , Rfl:  .  NON FORMULARY, Take 2  tablets by mouth daily., Disp: , Rfl:  .  Omega-3 Fatty Acids (FISH OIL) 1200 MG CAPS, Take 1,200 mg by mouth daily., Disp: , Rfl:  .  tamsulosin (FLOMAX) 0.4 MG CAPS capsule, Take 2 capsules (0.8 mg total) by mouth daily., Disp: 60 capsule, Rfl: 11  Physical exam:  Vitals:   04/08/18 1034  BP: (!) 171/72  Pulse: 80  Resp: 18  Temp: 98.2 F (36.8 C)  TempSrc: Tympanic  Weight: 246 lb 11.2 oz (111.9 kg)  Height: 5\' 6"  (1.676 m)   Physical Exam  Constitutional: He is oriented to person, place, and time.  He is mildly obese. No acute distress  HENT:  Head: Normocephalic and atraumatic.  Eyes: Pupils are equal, round, and reactive to light. EOM are normal.  Neck: Normal range of motion.  Cardiovascular: Normal rate, regular rhythm and normal heart sounds.  Pulmonary/Chest: Effort normal and breath sounds normal.  Abdominal: Soft. Bowel sounds are normal.  Neurological: He is alert and oriented to person, place, and time.  Skin: Skin is warm and dry.     CMP Latest Ref Rng & Units 04/08/2018  Glucose 70 - 99 mg/dL 123(H)  BUN 8 - 23 mg/dL 22  Creatinine 0.61 - 1.24 mg/dL 1.09  Sodium 135 - 145 mmol/L 139  Potassium 3.5 - 5.1 mmol/L 4.4  Chloride 98 - 111 mmol/L 104  CO2 22 - 32 mmol/L 26  Calcium 8.9 - 10.3 mg/dL 9.6  Total Protein 6.5 - 8.1  g/dL 7.1  Total Bilirubin 0.3 - 1.2 mg/dL 0.6  Alkaline Phos 38 - 126 U/L 46  AST 15 - 41 U/L 29  ALT 0 - 44 U/L 37   CBC Latest Ref Rng & Units 04/08/2018  WBC 3.8 - 10.6 K/uL 6.4  Hemoglobin 13.0 - 18.0 g/dL 12.4(L)  Hematocrit 40.0 - 52.0 % 36.2(L)  Platelets 150 - 440 K/uL 219    No images are attached to the encounter.  Ct Chest W Contrast  Result Date: 04/07/2018 CLINICAL DATA:  Patient with history of prostate cancer. Follow-up exam. EXAM: CT CHEST, ABDOMEN, AND PELVIS WITH CONTRAST TECHNIQUE: Multidetector CT imaging of the chest, abdomen and pelvis was performed following the standard protocol during bolus administration of intravenous contrast. CONTRAST:  177mL ISOVUE-300 IOPAMIDOL (ISOVUE-300) INJECTION 61% COMPARISON:  CT abdomen pelvis 02/02/2017 FINDINGS: CT CHEST FINDINGS Cardiovascular: Normal heart size. No pericardial effusion. Coronary arterial vascular calcifications. Thoracic aortic vascular calcifications. Mediastinum/Nodes: No enlarged axillary, mediastinal or hilar lymphadenopathy. Normal appearance of the esophagus. Lungs/Pleura: Central airways are patent. Dependent atelectasis left lower lobe. No pleural effusion or pneumothorax. Musculoskeletal: Thoracic spine degenerative changes. No aggressive or acute appearing osseous lesions. Healing posterior left tenth rib fracture with callus formation. CT ABDOMEN PELVIS FINDINGS Hepatobiliary: Liver is diffusely low in attenuation compatible with steatosis. No focal hepatic lesion is identified. Gallbladder is unremarkable. No intrahepatic or extrahepatic biliary ductal dilatation. Pancreas: Unremarkable Spleen: Unremarkable Adrenals/Urinary Tract: Adrenal glands are normal. Kidneys enhance symmetrically with contrast. 2.8 cm cyst inferior pole right kidney. Additional bilateral subcentimeter too small to characterize low-attenuation renal lesions. Hree demonstrated wall thickening of the posterior aspect of the urinary bladder  (image 106; series 2). Stomach/Bowel: Sigmoid colonic diverticulosis. No CT evidence for acute diverticulitis. Normal morphology of the stomach. No evidence for small bowel obstruction. No free fluid or free intraperitoneal air. Similar-appearing calcified tissue within the small bowel mesentery (image 78; series 2). Vascular/Lymphatic: Infrarenal abdominal aortic ectasia measuring 2.8 cm. Peripheral calcified noncalcified atherosclerotic  plaque. Reproductive: Central dystrophic calcifications in the prostate. Other: Bilateral fat containing inguinal hernias. Musculoskeletal: Lumbar spine degenerative changes. No aggressive or acute appearing osseous lesions. Postsurgical changes proximal right femur. IMPRESSION: No evidence for metastatic disease within the chest, abdomen or pelvis. Hepatic steatosis. Persistent wall thickening of the urinary bladder, potentially secondary to outlet obstruction. Electronically Signed   By: Lovey Newcomer M.D.   On: 04/07/2018 11:02   Nm Bone Scan Whole Body  Result Date: 04/06/2018 CLINICAL DATA:  Prostate cancer, LEFT rib and back pain EXAM: NUCLEAR MEDICINE WHOLE BODY BONE SCAN TECHNIQUE: Whole body anterior and posterior images were obtained approximately 3 hours after intravenous injection of radiopharmaceutical. RADIOPHARMACEUTICALS:  20.46 mCi Technetium-35m MDP IV COMPARISON:  02/02/2017 Radiographic correlation: CT chest abdomen pelvis 04/06/2018 FINDINGS: Uptake at the shoulders, sternoclavicular joints, and knees, typically degenerative. Uptake at the posterolateral LEFT tenth rib with resolution of uptake seen previously at the adjacent eleventh rib; these likely reflected prior trauma. Minimal uptake at scattered costovertebral junctions and at LEFT first costochondral junction, likely degenerative. No definite sites of abnormal osseous tracer accumulation are identified to suggest osseous metastatic disease. Photopenic defect at proximal RIGHT femur corresponds to  orthopedic hardware on CT. Expected urinary tract and soft tissue distribution of tracer. IMPRESSION: No definite scintigraphic evidence of osseous metastatic disease. Electronically Signed   By: Lavonia Dana M.D.   On: 04/06/2018 18:29   Ct Abdomen Pelvis W Contrast  Result Date: 04/07/2018 CLINICAL DATA:  Patient with history of prostate cancer. Follow-up exam. EXAM: CT CHEST, ABDOMEN, AND PELVIS WITH CONTRAST TECHNIQUE: Multidetector CT imaging of the chest, abdomen and pelvis was performed following the standard protocol during bolus administration of intravenous contrast. CONTRAST:  110mL ISOVUE-300 IOPAMIDOL (ISOVUE-300) INJECTION 61% COMPARISON:  CT abdomen pelvis 02/02/2017 FINDINGS: CT CHEST FINDINGS Cardiovascular: Normal heart size. No pericardial effusion. Coronary arterial vascular calcifications. Thoracic aortic vascular calcifications. Mediastinum/Nodes: No enlarged axillary, mediastinal or hilar lymphadenopathy. Normal appearance of the esophagus. Lungs/Pleura: Central airways are patent. Dependent atelectasis left lower lobe. No pleural effusion or pneumothorax. Musculoskeletal: Thoracic spine degenerative changes. No aggressive or acute appearing osseous lesions. Healing posterior left tenth rib fracture with callus formation. CT ABDOMEN PELVIS FINDINGS Hepatobiliary: Liver is diffusely low in attenuation compatible with steatosis. No focal hepatic lesion is identified. Gallbladder is unremarkable. No intrahepatic or extrahepatic biliary ductal dilatation. Pancreas: Unremarkable Spleen: Unremarkable Adrenals/Urinary Tract: Adrenal glands are normal. Kidneys enhance symmetrically with contrast. 2.8 cm cyst inferior pole right kidney. Additional bilateral subcentimeter too small to characterize low-attenuation renal lesions. Hree demonstrated wall thickening of the posterior aspect of the urinary bladder (image 106; series 2). Stomach/Bowel: Sigmoid colonic diverticulosis. No CT evidence for acute  diverticulitis. Normal morphology of the stomach. No evidence for small bowel obstruction. No free fluid or free intraperitoneal air. Similar-appearing calcified tissue within the small bowel mesentery (image 78; series 2). Vascular/Lymphatic: Infrarenal abdominal aortic ectasia measuring 2.8 cm. Peripheral calcified noncalcified atherosclerotic plaque. Reproductive: Central dystrophic calcifications in the prostate. Other: Bilateral fat containing inguinal hernias. Musculoskeletal: Lumbar spine degenerative changes. No aggressive or acute appearing osseous lesions. Postsurgical changes proximal right femur. IMPRESSION: No evidence for metastatic disease within the chest, abdomen or pelvis. Hepatic steatosis. Persistent wall thickening of the urinary bladder, potentially secondary to outlet obstruction. Electronically Signed   By: Lovey Newcomer M.D.   On: 04/07/2018 11:02     Assessment and plan- Patient is a 84 y.o. male with castrate resistant non metastatic prostate cancer  I have  personally reveiwed CT chest abdomen images and bone scan images independently. I have discussed findings with the patient. He does not have any evidence of metastatic disease. His PSA has been gradually increasing with apsa doubling time of 9.9 months despite using lupron. I had discussed that botha palutamide and enzalutamide are options in this setting. I had also discussed risks/ benefits of each of these drugs. Patient has read and given it a thought. He would liek to hold off on starting any additional meds at this time. He is experience ing some fatigue and hot flashes with lupron as well as reduced exercice tolerance and is concerned that these symptoms might worsen.  I will repeat psa in 3 months and see him back in 6 months. He is willing to consider these options down the line, if his PSA doubling time continues to shorten. He will continue to receive lupron through urology  Visit Diagnosis 1. Prostate cancer [facility])        Dr. Randa Evens, MD, MPH [facility] [phone] 04/11/2018 8:22 AM

## 2018-05-03 DIAGNOSIS — M9905 Segmental and somatic dysfunction of pelvic region: Secondary | ICD-10-CM | POA: Diagnosis not present

## 2018-05-03 DIAGNOSIS — M955 Acquired deformity of pelvis: Secondary | ICD-10-CM | POA: Diagnosis not present

## 2018-05-03 DIAGNOSIS — M5416 Radiculopathy, lumbar region: Secondary | ICD-10-CM | POA: Diagnosis not present

## 2018-05-03 DIAGNOSIS — M9903 Segmental and somatic dysfunction of lumbar region: Secondary | ICD-10-CM | POA: Diagnosis not present

## 2018-05-27 ENCOUNTER — Telehealth: Payer: Self-pay

## 2018-05-27 DIAGNOSIS — E782 Mixed hyperlipidemia: Secondary | ICD-10-CM

## 2018-05-27 DIAGNOSIS — E559 Vitamin D deficiency, unspecified: Secondary | ICD-10-CM

## 2018-05-27 DIAGNOSIS — E569 Vitamin deficiency, unspecified: Secondary | ICD-10-CM

## 2018-05-27 NOTE — Telephone Encounter (Signed)
CPE labs ordered. Forwarded to PCP for review. Please note CBC and CMP completed in 9/19.

## 2018-06-03 ENCOUNTER — Ambulatory Visit: Payer: Medicare Other

## 2018-06-03 ENCOUNTER — Ambulatory Visit (INDEPENDENT_AMBULATORY_CARE_PROVIDER_SITE_OTHER): Payer: Medicare Other

## 2018-06-03 VITALS — BP 142/60 | HR 70 | Temp 97.7°F | Ht 67.5 in | Wt 241.8 lb

## 2018-06-03 DIAGNOSIS — E782 Mixed hyperlipidemia: Secondary | ICD-10-CM

## 2018-06-03 DIAGNOSIS — Z0000 Encounter for general adult medical examination without abnormal findings: Secondary | ICD-10-CM | POA: Diagnosis not present

## 2018-06-03 DIAGNOSIS — E559 Vitamin D deficiency, unspecified: Secondary | ICD-10-CM

## 2018-06-03 LAB — LIPID PANEL
CHOL/HDL RATIO: 4
Cholesterol: 200 mg/dL (ref 0–200)
HDL: 56.7 mg/dL (ref >39.00)
LDL Cholesterol: 114 mg/dL — ABNORMAL HIGH (ref 0–99)
NONHDL: 142.88
Triglycerides: 144 mg/dL (ref 0.0–149.0)
VLDL: 28.8 mg/dL (ref 0.0–40.0)

## 2018-06-03 LAB — VITAMIN D 25 HYDROXY (VIT D DEFICIENCY, FRACTURES): VITD: 33.03 ng/mL (ref 30.00–100.00)

## 2018-06-03 NOTE — Patient Instructions (Signed)
Cesar Powell , Thank you for taking time to come for your Medicare Wellness Visit. I appreciate your ongoing commitment to your health goals. Please review the following plan we discussed and let me know if I can assist you in the future.   These are the goals we discussed: Goals    . Increase physical activity     Starting 06/03/2018, I will continue to exercise for 30-75 minutes 5 days per week.        This is a list of the screening recommended for you and due dates:  Health Maintenance  Topic Date Due  . Flu Shot  06/13/2018*  . Tetanus Vaccine  09/06/2019  . Pneumonia vaccines  Completed  *Topic was postponed. The date shown is not the original due date.   Preventive Care for Adults  A healthy lifestyle and preventive care can promote health and wellness. Preventive health guidelines for adults include the following key practices.  . A routine yearly physical is a good way to check with your health care provider about your health and preventive screening. It is a chance to share any concerns and updates on your health and to receive a thorough exam.  . Visit your dentist for a routine exam and preventive care every 6 months. Brush your teeth twice a day and floss once a day. Good oral hygiene prevents tooth decay and gum disease.  . The frequency of eye exams is based on your age, health, family medical history, use  of contact lenses, and other factors. Follow your health care provider's recommendations for frequency of eye exams.  . Eat a healthy diet. Foods like vegetables, fruits, whole grains, low-fat dairy products, and lean protein foods contain the nutrients you need without too many calories. Decrease your intake of foods high in solid fats, added sugars, and salt. Eat the right amount of calories for you. Get information about a proper diet from your health care provider, if necessary.  . Regular physical exercise is one of the most important things you can do for your  health. Most adults should get at least 150 minutes of moderate-intensity exercise (any activity that increases your heart rate and causes you to sweat) each week. In addition, most adults need muscle-strengthening exercises on 2 or more days a week.  Silver Sneakers may be a benefit available to you. To determine eligibility, you may visit the website: www.silversneakers.com or contact program at 423-227-4572 Mon-Fri between 8AM-8PM.   . Maintain a healthy weight. The body mass index (BMI) is a screening tool to identify possible weight problems. It provides an estimate of body fat based on height and weight. Your health care provider can find your BMI and can help you achieve or maintain a healthy weight.   For adults 20 years and older: ? A BMI below 18.5 is considered underweight. ? A BMI of 18.5 to 24.9 is normal. ? A BMI of 25 to 29.9 is considered overweight. ? A BMI of 30 and above is considered obese.   . Maintain normal blood lipids and cholesterol levels by exercising and minimizing your intake of saturated fat. Eat a balanced diet with plenty of fruit and vegetables. Blood tests for lipids and cholesterol should begin at age 44 and be repeated every 5 years. If your lipid or cholesterol levels are high, you are over 50, or you are at high risk for heart disease, you may need your cholesterol levels checked more frequently. Ongoing high lipid and cholesterol levels  should be treated with medicines if diet and exercise are not working.  . If you smoke, find out from your health care provider how to quit. If you do not use tobacco, please do not start.  . If you choose to drink alcohol, please do not consume more than 2 drinks per day. One drink is considered to be 12 ounces (355 mL) of beer, 5 ounces (148 mL) of wine, or 1.5 ounces (44 mL) of liquor.  . If you are 47-93 years old, ask your health care provider if you should take aspirin to prevent strokes.  . Use sunscreen. Apply  sunscreen liberally and repeatedly throughout the day. You should seek shade when your shadow is shorter than you. Protect yourself by wearing long sleeves, pants, a wide-brimmed hat, and sunglasses year round, whenever you are outdoors.  . Once a month, do a whole body skin exam, using a mirror to look at the skin on your back. Tell your health care provider of new moles, moles that have irregular borders, moles that are larger than a pencil eraser, or moles that have changed in shape or color.

## 2018-06-03 NOTE — Progress Notes (Signed)
Subjective:   Cesar Powell is a 84 y.o. male who presents for Medicare Annual/Subsequent preventive examination.  Review of Systems:  N/A Cardiac Risk Factors include: advanced age (>61men, >36 women);male gender;obesity (BMI >30kg/m2);hypertension     Objective:    Vitals: BP (!) 142/60 (BP Location: Right Arm, Patient Position: Sitting, Cuff Size: Large)   Pulse 70   Temp 97.7 F (36.5 C) (Oral)   Ht 5' 7.5" (1.715 m) Comment: shoes  Wt 241 lb 12 oz (109.7 kg)   SpO2 97%   BMI 37.30 kg/m   Body mass index is 37.3 kg/m.  Advanced Directives 06/03/2018 04/08/2018 03/25/2018 06/02/2017 10/21/2016 08/14/2016 01/29/2016  Does Patient Have a Medical Advance Directive? No No No No No No No  Would patient like information on creating a medical advance directive? Yes (MAU/Ambulatory/Procedural Areas - Information given) No - Patient declined No - Patient declined No - Patient declined No - Patient declined No - Patient declined No - patient declined information    Tobacco Social History   Tobacco Use  Smoking Status Former Smoker  . Packs/day: 1.00  . Years: 15.00  . Pack years: 15.00  . Types: Cigarettes  . Last attempt to quit: 09/14/1965  . Years since quitting: 52.7  Smokeless Tobacco Never Used     Counseling given: No   Clinical Intake:  Pre-visit preparation completed: Yes  Pain : No/denies pain Pain Score: 0-No pain     Nutritional Status: BMI > 30  Obese Nutritional Risks: None Diabetes: No  How often do you need to have someone help you when you read instructions, pamphlets, or other written materials from your doctor or pharmacy?: 1 - Never What is the last grade level you completed in school?: Bachelor degree  Interpreter Needed?: No  Comments: pt lives with spouse Information entered by :: LPinson, LPN  Past Medical History:  Diagnosis Date  . Discoloration of nail   . H/O prostate cancer    was treated with radiation  . Hypertension   .  Local recurrence of prostate cancer ([facility]) 06/22/2007   Qualifier: Diagnosis of  By: Fuller Plan CMA (AAMA), Lug[place]    . Shingles    Past Surgical History:  Procedure Laterality Date  . ABDOMINAL SURGERY     ruptured intestines   . CATARACT EXTRACTION W/PHACO Left 01/29/2016   Procedure: CATARACT EXTRACTION PHACO AND INTRAOCULAR LENS PLACEMENT ([place]) left eye;  Surgeon: Leandrew Koyanagi, MD;  Location: [place];  Service: Ophthalmology;  Laterality: Left;  RESTOR LENS  . CATARACT EXTRACTION W/PHACO Right 10/21/2016   Procedure: CATARACT EXTRACTION PHACO AND INTRAOCULAR LENS PLACEMENT ([facility])  Right restor toric lens;  Surgeon: Leandrew Koyanagi, MD;  Location: [facility];  Service: Ophthalmology;  Laterality: Right;  restor toric lens  . CYSTOSCOPY WITH LITHOLAPAXY N/A 08/14/2016   Procedure: CYSTOSCOPY WITH LITHOLAPAXY;  Surgeon: Nickie Retort, MD;  Location: [facility];  Service: Urology;  Laterality: N/A;  . FEMUR IM NAIL Right 10/08/2014   Procedure: INTRAMEDULLARY (IM) NAIL FEMORAL;  Surgeon: Rod Can, MD;  Location: [facility];  Service: Orthopedics;  Laterality: Right;  PER DANIELLE 110 MIN  . HOLMIUM LASER APPLICATION  10/30/760   Procedure: HOLMIUM LASER APPLICATION;  Surgeon: Nickie Retort, MD;  Location: [facility];  Service: Urology;;  . OTHER SURGICAL HISTORY  2006   Prostate Radiation Surgery    Family History  Problem Relation Age of Onset  . Heart disease Brother   . Heart attack Brother   .  Prostate cancer Neg Hx   . Bladder Cancer Neg Hx   . Kidney cancer Neg Hx    Social History   Socioeconomic History  . Marital status: Married    Spouse name: Not on file  . Number of children: Not on file  . Years of education: Not on file  . Highest education level: Not on file  Occupational History  . Occupation: retired  Scientific laboratory technician  . Financial resource strain: Not on file  . Food insecurity:    Worry: Not on file    Inability: Not on file  .  Transportation needs:    Medical: Not on file    Non-medical: Not on file  Tobacco Use  . Smoking status: Former Smoker    Packs/day: 1.00    Years: 15.00    Pack years: 15.00    Types: Cigarettes    Last attempt to quit: 09/14/1965    Years since quitting: 52.7  . Smokeless tobacco: Never Used  Substance and Sexual Activity  . Alcohol use: Yes    Alcohol/week: 7.0 standard drinks    Types: 7 Glasses of wine per week  . Drug use: No  . Sexual activity: Not on file  Lifestyle  . Physical activity:    Days per week: Not on file    Minutes per session: Not on file  . Stress: Not on file  Relationships  . Social connections:    Talks on phone: Not on file    Gets together: Not on file    Attends religious service: Not on file    Active member of club or organization: Not on file    Attends meetings of clubs or organizations: Not on file    Relationship status: Not on file  Other Topics Concern  . Not on file  Social History Narrative  . Not on file    Outpatient Encounter Medications as of 06/03/2018  Medication Sig  . Ascorbic Acid (VITAMIN C) 1000 MG tablet Take 1,000 mg by mouth daily.  . cholecalciferol (VITAMIN D) 1000 UNITS tablet Take 1,000 Units by mouth daily.  . Glucosamine-Chondroitin (GLUCOSAMINE CHONDR COMPLEX PO) Take 2 capsules by mouth daily.  . Multiple Vitamin (MULTIVITAMIN) capsule Take 1 capsule by mouth daily.  . NON FORMULARY Take 2 tablets by mouth daily.  . Omega-3 Fatty Acids (FISH OIL) 1200 MG CAPS Take 1,200 mg by mouth daily.  . tamsulosin (FLOMAX) 0.4 MG CAPS capsule Take 2 capsules (0.8 mg total) by mouth daily.   No facility-administered encounter medications on file as of 06/03/2018.     Activities of Daily Living In your present state of health, do you have any difficulty performing the following activities: 06/03/2018  Hearing? Y  Vision? N  Difficulty concentrating or making decisions? N  Walking or climbing stairs? N  Dressing or  bathing? N  Doing errands, shopping? N  Preparing Food and eating ? N  Using the Toilet? N  In the past six months, have you accidently leaked urine? N  Do you have problems with loss of bowel control? N  Managing your Medications? N  Managing your Finances? N  Housekeeping or managing your Housekeeping? N  Some recent data might be hidden    Patient Care Team: Owens Loffler, MD as PCP - General Leandrew Koyanagi, MD as Referring Physician (Ophthalmology) Nickie Retort, MD as Consulting Physician (Urology)   Assessment:   This is a routine wellness examination for Salena Saner Acuity Screening  Right eye Left eye Both eyes  Without correction: 20/25 20/30 20/20-1  With correction:     Hearing Screening Comments: Bilateral hearing aids    Exercise Activities and Dietary recommendations Current Exercise Habits: Home exercise routine, Type of exercise: strength training/weights;stretching;treadmill;Other - see comments(stationary bike), Time (Minutes): 45, Frequency (Times/Week): 7, Weekly Exercise (Minutes/Week): 315, Intensity: Moderate, Exercise limited by: None identified  Goals    . Increase physical activity     Starting 06/03/2018, I will continue to exercise for 30-75 minutes 5 days per week.        Fall Risk Fall Risk  06/03/2018 06/02/2017 10/21/2015  Falls in the past year? 0 No No   Depression Screen PHQ 2/9 Scores 06/03/2018 06/02/2017 10/21/2015  PHQ - 2 Score 0 0 0  PHQ- 9 Score 0 0 -    Cognitive Function MMSE - Mini Mental State Exam 06/03/2018 06/02/2017  Orientation to time 5 5  Orientation to Place 5 5  Registration 3 3  Attention/ Calculation 0 0  Recall 1 3  Recall-comments unable to recall 2 of 3 words -  Language- name 2 objects 0 0  Language- repeat 1 1  Language- follow 3 step command 3 3  Language- read & follow direction 0 0  Write a sentence 0 0  Copy design 0 0  Total score 18 20       PLEASE NOTE: A Mini-Cog  screen was completed. Maximum score is 20. A value of 0 denotes this part of [place] MMSE was not completed or the patient failed this part of the Mini-Cog screening.   Mini-Cog Screening Orientation to Time - Max 5 pts Orientation to Place - Max 5 pts Registration - Max 3 pts Recall - Max 3 pts Language Repeat - Max 1 pts Language Follow 3 Step Command - Max 3 pts   Immunization History  Administered Date(s) Administered  . Influenza Whole 09/05/2009  . Influenza,inj,Quad PF,6+ Mos 06/10/2017  . Pneumococcal Conjugate-13 06/10/2017  . Pneumococcal Polysaccharide-23 09/05/2009  . Td 09/05/2009  . Zoster 09/05/2009    Screening Tests Health Maintenance  Topic Date Due  . INFLUENZA VACCINE  06/13/2018 (Originally 03/03/2018)  . TETANUS/TDAP  09/06/2019  . PNA vac Low Risk Adult  Completed       Plan:     I have personally reviewed, addressed, and noted the following in the patient's chart:  A. Medical and social history B. Use of alcohol, tobacco or illicit drugs  C. Current medications and supplements D. Functional ability and status E.  Nutritional status F.  Physical activity G. Advance directives H. List of other physicians I.  Hospitalizations, surgeries, and ER visits in previous 12 months J.  [place] to include hearing, vision, cognitive, depression L. Referrals and appointments - none  In addition, I have reviewed and discussed with patient certain preventive protocols, quality metrics, and best practice recommendations. A written personalized care plan for preventive services as well as general preventive health recommendations were provided to patient.  See attached scanned questionnaire for additional information.   Signed,   Lindell Noe, MHA, BS, LPN Health Coach

## 2018-06-03 NOTE — Progress Notes (Signed)
PCP notes:   Health maintenance:  Flu vaccine - pt requests vaccine at CPE   Abnormal screenings:   Mini-Cog score: 18/20 MMSE - Mini Mental State Exam 06/03/2018 06/02/2017  Orientation to time 5 5  Orientation to Place 5 5  Registration 3 3  Attention/ Calculation 0 0  Recall 1 3  Recall-comments unable to recall 2 of 3 words -  Language- name 2 objects 0 0  Language- repeat 1 1  Language- follow 3 step command 3 3  Language- read & follow direction 0 0  Write a sentence 0 0  Copy design 0 0  Total score 18 20    Patient concerns:   None  Nurse concerns:  None  Next PCP appt:   06/13/2018 @ 920

## 2018-06-03 NOTE — Progress Notes (Signed)
I reviewed health advisor's note, was available for consultation, and agree with documentation and plan.  

## 2018-06-07 DIAGNOSIS — M5416 Radiculopathy, lumbar region: Secondary | ICD-10-CM | POA: Diagnosis not present

## 2018-06-07 DIAGNOSIS — M955 Acquired deformity of pelvis: Secondary | ICD-10-CM | POA: Diagnosis not present

## 2018-06-07 DIAGNOSIS — M9903 Segmental and somatic dysfunction of lumbar region: Secondary | ICD-10-CM | POA: Diagnosis not present

## 2018-06-07 DIAGNOSIS — M9905 Segmental and somatic dysfunction of pelvic region: Secondary | ICD-10-CM | POA: Diagnosis not present

## 2018-06-13 ENCOUNTER — Encounter: Payer: Medicare Other | Admitting: Family Medicine

## 2018-06-16 ENCOUNTER — Inpatient Hospital Stay: Payer: Medicare Other | Attending: Oncology

## 2018-06-16 DIAGNOSIS — C61 Malignant neoplasm of prostate: Secondary | ICD-10-CM | POA: Insufficient documentation

## 2018-06-16 LAB — CBC WITH DIFFERENTIAL/PLATELET
ABS IMMATURE GRANULOCYTES: 0.01 10*3/uL (ref 0.00–0.07)
BASOS PCT: 0 %
Basophils Absolute: 0 10*3/uL (ref 0.0–0.1)
EOS ABS: 0.2 10*3/uL (ref 0.0–0.5)
Eosinophils Relative: 3 %
HCT: 37.7 % — ABNORMAL LOW (ref 39.0–52.0)
Hemoglobin: 12.3 g/dL — ABNORMAL LOW (ref 13.0–17.0)
IMMATURE GRANULOCYTES: 0 %
Lymphocytes Relative: 20 %
Lymphs Abs: 1.2 10*3/uL (ref 0.7–4.0)
MCH: 31.2 pg (ref 26.0–34.0)
MCHC: 32.6 g/dL (ref 30.0–36.0)
MCV: 95.7 fL (ref 80.0–100.0)
Monocytes Absolute: 0.5 10*3/uL (ref 0.1–1.0)
Monocytes Relative: 9 %
NEUTROS ABS: 4.1 10*3/uL (ref 1.7–7.7)
Neutrophils Relative %: 68 %
PLATELETS: 201 10*3/uL (ref 150–400)
RBC: 3.94 MIL/uL — AB (ref 4.22–5.81)
RDW: 13.6 % (ref 11.5–15.5)
WBC: 6.1 10*3/uL (ref 4.0–10.5)
nRBC: 0 % (ref 0.0–0.2)

## 2018-06-16 LAB — COMPREHENSIVE METABOLIC PANEL
ALT: 29 U/L (ref 0–44)
ANION GAP: 8 (ref 5–15)
AST: 24 U/L (ref 15–41)
Albumin: 4.4 g/dL (ref 3.5–5.0)
Alkaline Phosphatase: 52 U/L (ref 38–126)
BILIRUBIN TOTAL: 0.5 mg/dL (ref 0.3–1.2)
BUN: 21 mg/dL (ref 8–23)
CO2: 27 mmol/L (ref 22–32)
Calcium: 9.6 mg/dL (ref 8.9–10.3)
Chloride: 102 mmol/L (ref 98–111)
Creatinine, Ser: 1.08 mg/dL (ref 0.61–1.24)
Glucose, Bld: 104 mg/dL — ABNORMAL HIGH (ref 70–99)
POTASSIUM: 4.3 mmol/L (ref 3.5–5.1)
Sodium: 137 mmol/L (ref 135–145)
TOTAL PROTEIN: 7.5 g/dL (ref 6.5–8.1)

## 2018-06-16 LAB — PSA: PROSTATIC SPECIFIC ANTIGEN: 2.23 ng/mL (ref 0.00–4.00)

## 2018-07-05 DIAGNOSIS — M5416 Radiculopathy, lumbar region: Secondary | ICD-10-CM | POA: Diagnosis not present

## 2018-07-05 DIAGNOSIS — M955 Acquired deformity of pelvis: Secondary | ICD-10-CM | POA: Diagnosis not present

## 2018-07-05 DIAGNOSIS — M9905 Segmental and somatic dysfunction of pelvic region: Secondary | ICD-10-CM | POA: Diagnosis not present

## 2018-07-05 DIAGNOSIS — M9903 Segmental and somatic dysfunction of lumbar region: Secondary | ICD-10-CM | POA: Diagnosis not present

## 2018-08-09 DIAGNOSIS — M5416 Radiculopathy, lumbar region: Secondary | ICD-10-CM | POA: Diagnosis not present

## 2018-08-09 DIAGNOSIS — M9903 Segmental and somatic dysfunction of lumbar region: Secondary | ICD-10-CM | POA: Diagnosis not present

## 2018-08-09 DIAGNOSIS — M9905 Segmental and somatic dysfunction of pelvic region: Secondary | ICD-10-CM | POA: Diagnosis not present

## 2018-08-09 DIAGNOSIS — M955 Acquired deformity of pelvis: Secondary | ICD-10-CM | POA: Diagnosis not present

## 2018-08-11 ENCOUNTER — Encounter: Payer: Self-pay | Admitting: *Deleted

## 2018-08-13 NOTE — Progress Notes (Signed)
Dr. Frederico Hamman T. Shamela Haydon, MD, [facility] [place], [address] Phone: [phone] Fax: [phone]  08/15/2018  Patient: Cesar Powell, MRN: [id number], DOB: 20-Oct-1930, 85 y.o.  Primary Physician:  Owens Loffler, MD   Chief Complaint  Patient presents with  . Annual Exam    Part 2   Subjective:   Cesar Powell is a 85 y.o. very pleasant male patient who presents with the following:  Pleasant gentleman whose history is significant for metastatic prostate cancer, but he is otherwise been fairly healthy.  He does have some high blood pressure and he had a subtrochanteric fracture of the femur several years ago.  He does have some metabolic syndrome as well as obesity.  He recently had his Medicare wellness examination, and he is here today for follow-up of general health care and general health maintenance. me naturopathic me Naturopathic meds  Energy levels are ppor with low levels of testosterone. Not playing any golf.   BP Readings from Last 3 Encounters:  08/15/18 (!) 160/64  06/03/18 (!) 142/60  04/08/18 (!) 171/72    160/70 Goals to lose weight The patient previously had seen Dr. Fletcher Anon, and he had him on both hydrochlorothiazide as well as amlodipine, but the patient self discontinued these, and he does not want to go back on blood pressure medication.  Past Medical History, Surgical History, Social History, Family History, Problem List, Medications, and Allergies have been reviewed and updated if relevant.  Patient Active Problem List   Diagnosis Date Noted  . Subtrochanteric fracture of femur ([facility]) 10/08/2014  . Essential hypertension 09/06/2009  . ERECTILE DYSFUNCTION, MILD 07/25/2007  . METABOLIC SYNDROME X 86/57/8469  . Obesity 06/22/2007  . DIVERTICULOSIS, COLON 06/22/2007  . Obstructive sleep apnea 06/22/2007  . Local recurrence of prostate cancer ([facility]) 06/22/2007  . DIVERTICULITIS, HX  OF 06/22/2007    Past Medical History:  Diagnosis Date  . Discoloration of nail   . H/O prostate cancer    was treated with radiation  . Hypertension   . Local recurrence of prostate cancer ([place]) 06/22/2007   Qualifier: Diagnosis of  By: Fuller Plan CMA (AAMA), Lugene    . Shingles     Past Surgical History:  Procedure Laterality Date  . ABDOMINAL SURGERY     ruptured intestines   . CATARACT EXTRACTION W/PHACO Left 01/29/2016   Procedure: CATARACT EXTRACTION PHACO AND INTRAOCULAR LENS PLACEMENT ([place]) left eye;  Surgeon: Leandrew Koyanagi, MD;  Location: [facility];  Service: Ophthalmology;  Laterality: Left;  RESTOR LENS  . CATARACT EXTRACTION W/PHACO Right 10/21/2016   Procedure: CATARACT EXTRACTION PHACO AND INTRAOCULAR LENS PLACEMENT ([facility])  Right restor toric lens;  Surgeon: Leandrew Koyanagi, MD;  Location: [facility];  Service: Ophthalmology;  Laterality: Right;  restor toric lens  . CYSTOSCOPY WITH LITHOLAPAXY N/A 08/14/2016   Procedure: CYSTOSCOPY WITH LITHOLAPAXY;  Surgeon: Nickie Retort, MD;  Location: [facility];  Service: Urology;  Laterality: N/A;  . FEMUR IM NAIL Right 10/08/2014   Procedure: INTRAMEDULLARY (IM) NAIL FEMORAL;  Surgeon: Rod Can, MD;  Location: [place];  Service: Orthopedics;  Laterality: Right;  PER DANIELLE 110 MIN  . HOLMIUM LASER APPLICATION  01/30/5283   Procedure: HOLMIUM LASER APPLICATION;  Surgeon: Nickie Retort, MD;  Location: [facility];  Service: Urology;;  . OTHER SURGICAL HISTORY  2006   Prostate Radiation Surgery     Social History   Socioeconomic History  .  Marital status: Married    Spouse name: Not on file  . Number of children: Not on file  . Years of education: Not on file  . Highest education level: Not on file  Occupational History  . Occupation: retired  Scientific laboratory technician  . Financial resource strain: Not on file  . Food insecurity:    Worry: Not on file    Inability: Not on file  . Transportation needs:     Medical: Not on file    Non-medical: Not on file  Tobacco Use  . Smoking status: Former Smoker    Packs/day: 1.00    Years: 15.00    Pack years: 15.00    Types: Cigarettes    Last attempt to quit: 09/14/1965    Years since quitting: 52.9  . Smokeless tobacco: Never Used  Substance and Sexual Activity  . Alcohol use: Yes    Alcohol/week: 7.0 standard drinks    Types: 7 Glasses of wine per week  . Drug use: No  . Sexual activity: Not on file  Lifestyle  . Physical activity:    Days per week: Not on file    Minutes per session: Not on file  . Stress: Not on file  Relationships  . Social connections:    Talks on phone: Not on file    Gets together: Not on file    Attends religious service: Not on file    Active member of club or organization: Not on file    Attends meetings of clubs or organizations: Not on file    Relationship status: Not on file  . Intimate partner violence:    Fear of current or ex partner: Not on file    Emotionally abused: Not on file    Physically abused: Not on file    Forced sexual activity: Not on file  Other Topics Concern  . Not on file  Social History Narrative  . Not on file    Family History  Problem Relation Age of Onset  . Heart disease Brother   . Heart attack Brother   . Prostate cancer Neg Hx   . Bladder Cancer Neg Hx   . Kidney cancer Neg Hx     No Known Allergies  Medication list reviewed and updated in full in [facility].   GEN: No acute illnesses, no fevers, chills. GI: No n/v/d, eating normally Pulm: No SOB Interactive and getting along well at home.  Otherwise, ROS is as per the HPI.  Objective:   BP (!) 160/64   Pulse 64   Temp 98.4 F (36.9 C) (Oral)   Ht 5' 7.5" (1.715 m)   Wt 247 lb (112 kg)   BMI 38.11 kg/m   GEN: WDWN, NAD, Non-toxic, A & O x 3 HEENT: Atraumatic, Normocephalic. Neck supple. No masses, No LAD. Ears and Nose: No external deformity. CV: RRR, No M/G/R. No JVD. No thrill. No  extra heart sounds. PULM: CTA B, no wheezes, crackles, rhonchi. No retractions. No resp. distress. No accessory muscle use. ABD: S, NT, ND, + BS, No rebound, No HSM  EXTR: No c/c/e NEURO Normal gait.  PSYCH: Normally interactive. Conversant. Not depressed or anxious appearing.  Calm demeanor.   Laboratory and Imaging Data: Results for orders placed or performed in visit on 06/16/18  Comprehensive metabolic panel  Result Value Ref Range   Sodium 137 135 - 145 mmol/L   Potassium 4.3 3.5 - 5.1 mmol/L   Chloride 102 98 - 111 mmol/L  CO2 27 22 - 32 mmol/L   Glucose, Bld 104 (H) 70 - 99 mg/dL   BUN 21 8 - 23 mg/dL   Creatinine, Ser 1.08 0.61 - 1.24 mg/dL   Calcium 9.6 8.9 - 10.3 mg/dL   Total Protein 7.5 6.5 - 8.1 g/dL   Albumin 4.4 3.5 - 5.0 g/dL   AST 24 15 - 41 U/L   ALT 29 0 - 44 U/L   Alkaline Phosphatase 52 38 - 126 U/L   Total Bilirubin 0.5 0.3 - 1.2 mg/dL   GFR calc non Af Amer >60 >60 mL/min   GFR calc Af Amer >60 >60 mL/min   Anion gap 8 5 - 15  CBC with Differential/Platelet  Result Value Ref Range   WBC 6.1 4.0 - 10.5 K/uL   RBC 3.94 (L) 4.22 - 5.81 MIL/uL   Hemoglobin 12.3 (L) 13.0 - 17.0 g/dL   HCT 37.7 (L) 39.0 - 52.0 %   MCV 95.7 80.0 - 100.0 fL   MCH 31.2 26.0 - 34.0 pg   MCHC 32.6 30.0 - 36.0 g/dL   RDW 13.6 11.5 - 15.5 %   Platelets 201 150 - 400 K/uL   nRBC 0.0 0.0 - 0.2 %   Neutrophils Relative % 68 %   Neutro Abs 4.1 1.7 - 7.7 K/uL   Lymphocytes Relative 20 %   Lymphs Abs 1.2 0.7 - 4.0 K/uL   Monocytes Relative 9 %   Monocytes Absolute 0.5 0.1 - 1.0 K/uL   Eosinophils Relative 3 %   Eosinophils Absolute 0.2 0.0 - 0.5 K/uL   Basophils Relative 0 %   Basophils Absolute 0.0 0.0 - 0.1 K/uL   Immature Granulocytes 0 %   Abs Immature Granulocytes 0.01 0.00 - 0.07 K/uL  PSA  Result Value Ref Range   Prostatic Specific Antigen 2.23 0.00 - 4.00 ng/mL     Assessment and Plan:   Essential hypertension  Local recurrence of prostate cancer  (HCC)  METABOLIC SYNDROME X  Obstructive sleep apnea  Need for prophylactic vaccination and inoculation against influenza - Plan: Flu Vaccine QUAD 6+ mos PF IM (Fluarix Quad PF)  >25 minutes spent in face to face time with patient, >50% spent in counselling or coordination of care   He does have metastatic prostate cancer and he is on Lupron and his PSA has been escalating despite this.  He is very frustrated with his testosterone being so low after chemical castration from Lupron.  He has no energy, and he feels like he does not really get much benefit from going to the gym anymore.  Blood pressure is high.  At this point he is not open to starting new blood pressure medications.  I did make sure he understood that he had increased risk for myocardial infarction and stroke.  He is going to work on losing weight as a means of controlling his blood pressure.  O/w he is doing reasonably well at almost 85 yo.  Follow-up: No follow-ups on file.  Orders Placed This Encounter  Procedures  . Flu Vaccine QUAD 6+ mos PF IM (Fluarix Quad PF)    Signed,  Masoud Nyce T. Jacoby Ritsema, MD   Outpatient Encounter Medications as of 08/15/2018  Medication Sig  . Ascorbic Acid (VITAMIN C) 1000 MG tablet Take 1,000 mg by mouth daily.  . cholecalciferol (VITAMIN D) 1000 UNITS tablet Take 1,000 Units by mouth daily.  . Glucosamine-Chondroitin (GLUCOSAMINE CHONDR COMPLEX PO) Take 2 capsules by mouth daily.  . Multiple  Vitamin (MULTIVITAMIN) capsule Take 1 capsule by mouth daily.  . NON FORMULARY Take 2 tablets by mouth daily.  . Omega-3 Fatty Acids (FISH OIL) 1200 MG CAPS Take 1,200 mg by mouth daily.  . tamsulosin (FLOMAX) 0.4 MG CAPS capsule Take 2 capsules (0.8 mg total) by mouth daily.   No facility-administered encounter medications on file as of 08/15/2018.

## 2018-08-15 ENCOUNTER — Encounter: Payer: Medicare Other | Admitting: Family Medicine

## 2018-08-15 ENCOUNTER — Ambulatory Visit (INDEPENDENT_AMBULATORY_CARE_PROVIDER_SITE_OTHER): Payer: Medicare Other | Admitting: Family Medicine

## 2018-08-15 ENCOUNTER — Encounter: Payer: Self-pay | Admitting: Family Medicine

## 2018-08-15 VITALS — BP 160/64 | HR 64 | Temp 98.4°F | Ht 67.5 in | Wt 247.0 lb

## 2018-08-15 DIAGNOSIS — Z79818 Long term (current) use of other agents affecting estrogen receptors and estrogen levels: Secondary | ICD-10-CM

## 2018-08-15 DIAGNOSIS — C61 Malignant neoplasm of prostate: Secondary | ICD-10-CM

## 2018-08-15 DIAGNOSIS — I10 Essential (primary) hypertension: Secondary | ICD-10-CM | POA: Diagnosis not present

## 2018-08-15 DIAGNOSIS — Z23 Encounter for immunization: Secondary | ICD-10-CM | POA: Diagnosis not present

## 2018-08-15 DIAGNOSIS — E8881 Metabolic syndrome: Secondary | ICD-10-CM | POA: Diagnosis not present

## 2018-08-15 DIAGNOSIS — G4733 Obstructive sleep apnea (adult) (pediatric): Secondary | ICD-10-CM | POA: Diagnosis not present

## 2018-08-16 DIAGNOSIS — Z79818 Long term (current) use of other agents affecting estrogen receptors and estrogen levels: Secondary | ICD-10-CM | POA: Insufficient documentation

## 2018-09-13 DIAGNOSIS — M5416 Radiculopathy, lumbar region: Secondary | ICD-10-CM | POA: Diagnosis not present

## 2018-09-13 DIAGNOSIS — M9905 Segmental and somatic dysfunction of pelvic region: Secondary | ICD-10-CM | POA: Diagnosis not present

## 2018-09-13 DIAGNOSIS — M9903 Segmental and somatic dysfunction of lumbar region: Secondary | ICD-10-CM | POA: Diagnosis not present

## 2018-09-13 DIAGNOSIS — M955 Acquired deformity of pelvis: Secondary | ICD-10-CM | POA: Diagnosis not present

## 2018-09-16 ENCOUNTER — Other Ambulatory Visit: Payer: Medicare Other

## 2018-09-16 ENCOUNTER — Other Ambulatory Visit: Payer: Self-pay

## 2018-09-16 DIAGNOSIS — C61 Malignant neoplasm of prostate: Secondary | ICD-10-CM | POA: Diagnosis not present

## 2018-09-17 LAB — PSA: PROSTATE SPECIFIC AG, SERUM: 2.9 ng/mL (ref 0.0–4.0)

## 2018-09-20 ENCOUNTER — Ambulatory Visit: Payer: Medicare Other | Admitting: Urology

## 2018-09-22 ENCOUNTER — Ambulatory Visit (INDEPENDENT_AMBULATORY_CARE_PROVIDER_SITE_OTHER): Payer: Medicare Other | Admitting: Urology

## 2018-09-22 ENCOUNTER — Encounter: Payer: Self-pay | Admitting: Urology

## 2018-09-22 ENCOUNTER — Inpatient Hospital Stay: Payer: Medicare Other | Attending: Oncology | Admitting: Oncology

## 2018-09-22 ENCOUNTER — Other Ambulatory Visit: Payer: Self-pay

## 2018-09-22 VITALS — BP 187/78 | HR 63 | Temp 94.6°F | Resp 18 | Wt 244.9 lb

## 2018-09-22 VITALS — BP 179/63 | HR 73 | Ht 64.0 in | Wt 245.0 lb

## 2018-09-22 DIAGNOSIS — I10 Essential (primary) hypertension: Secondary | ICD-10-CM | POA: Diagnosis not present

## 2018-09-22 DIAGNOSIS — C61 Malignant neoplasm of prostate: Secondary | ICD-10-CM | POA: Diagnosis not present

## 2018-09-22 DIAGNOSIS — Z923 Personal history of irradiation: Secondary | ICD-10-CM

## 2018-09-22 DIAGNOSIS — E291 Testicular hypofunction: Secondary | ICD-10-CM | POA: Diagnosis not present

## 2018-09-22 DIAGNOSIS — Z87891 Personal history of nicotine dependence: Secondary | ICD-10-CM

## 2018-09-22 DIAGNOSIS — Z79899 Other long term (current) drug therapy: Secondary | ICD-10-CM | POA: Diagnosis not present

## 2018-09-22 DIAGNOSIS — Z9189 Other specified personal risk factors, not elsewhere classified: Secondary | ICD-10-CM

## 2018-09-22 MED ORDER — LEUPROLIDE ACETATE (6 MONTH) 45 MG IM KIT
45.0000 mg | PACK | Freq: Once | INTRAMUSCULAR | Status: AC
  Administered 2018-09-22: 45 mg via INTRAMUSCULAR

## 2018-09-22 NOTE — Progress Notes (Signed)
Here for follow up. Saw urologist ( Dr Cornelius Moras) this am . Vs taken x 2 204/80 (pt ran up stairs and stated he had a triple espresso this am ) then 187/78 p 63  Dr Janese Banks informed. Pt in NAD and was asymptomatic and happy. Third vs- taken at 1120 am- 179/73 p 62

## 2018-09-22 NOTE — Progress Notes (Signed)
Lupron IM Injection   Due to Prostate Cancer patient is present today for a Lupron Injection.  Medication: Lupron 6 month Dose: 45 mg  Location: right upper outer buttocks Lot: [id number] Exp: 12/07/2020  Patient tolerated well, no complications were noted  Performed by: Fonnie Jarvis, CMA  Follow up: 6month

## 2018-09-22 NOTE — Progress Notes (Signed)
   09/22/2018 9:17 AM   Cesar Powell [birth year] [id number]  Reason for visit: Follow up biochemical recurrence prostate cancer  HPI:  I saw Cesar Powell back in urology clinic for follow-up of biochemical recurrence of prostate cancer.  To briefly summarize he is a very healthy 85 year old male who was diagnosed with prostate cancer in 2005 and treated with radiation.  His outside prior records are unavailable to me.  He was then started on testosterone by different provider and his PSA rose to as high as 18, his testosterone was stopped at that time and he was started on Lupron.  His PSA dropped to as low as 0.4 on Lupron, however PSA has now risen to 2.9.  His PSA doubling time is 9.8 months.  He was evaluated by medical oncology for possible additional antiandrogen therapy.  They performed staging imaging with CT chest abdomen and pelvis, as well as bone scan in September 2019 which was all negative for metastatic disease.  They offered him both apalutamide and enzalutamide, however he was hesitant to start any additional therapy secondary to possible Powell effects.  He also has a history of a right femur fracture and is on vitamin D and calcium supplementation since that time.  He also has a history of lower urinary tract symptoms and history of cystolitholapaxy.  His urinary symptoms are currently well controlled on Flomax.  He denies any bone pain, weight loss.   PSA today is 2.9, PSA doubling time 9.8 months.  We long conversation about his biochemical recurrence and metastatic castrate resistant prostate cancer.  He has an appointment with oncology today to re-discuss additional therapy modalities.  70-month Lupron injection given today.  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   Cesar Powell, [facility] [address] [place], [place] [address] [phone]

## 2018-09-23 ENCOUNTER — Encounter: Payer: Self-pay | Admitting: Oncology

## 2018-09-23 NOTE — Progress Notes (Signed)
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 He has a history of prostate cancer that was diagnosed and treated with radiation in 2005. At that point he his PSA was about 0.03 for a long time. Then started on Axiron for low testosterone. PSA eventually went up to as high as 18 2015. At that point he was started on Lupron his PSA doubling time is around 10 months. Most recently since October 2018 after his PSA went down from 3.6-1.7 and has been gradually increasing to 1.3,1.5and 2 indicating a PSAdoublingtime of 9.9 months.Testosterone levels continue to be suppressed.Patient is tolerating his Lupron well except for some mild fatigue and hot flashes. He is fit and continues to exercise. he has not started oral anti androgen therapy yet.   patient was last seen by me in September 2019 discussed adding oral antiandrogens like apalutamide versus enzalutamide to Lupron in castrate resistant nonmetastatic prostate cancer.  After discussing risks and benefits patient did not wish to proceed with that just yet and continues to be on Lupron alone.  Interval history-patient continues to enjoy a good quality of life and he is active for his age and also goes to the gym fairly regularly.  He does have some fatigue which has been his biggest limiting factor  ECOG PS- 1 Pain scale- 0 Opioid  associated constipation- no  Review of systems- Review of Systems  Constitutional: Negative for chills, fever, malaise/fatigue and weight loss.  HENT: Negative for congestion, ear discharge and nosebleeds.   Eyes: Negative for blurred vision.  Respiratory: Negative for cough, hemoptysis, sputum production, shortness of breath and wheezing.   Cardiovascular: Negative for chest pain, palpitations, orthopnea and claudication.  Gastrointestinal: Negative for abdominal pain, blood in stool, constipation, diarrhea, heartburn, melena, nausea and vomiting.  Genitourinary: Negative for dysuria, flank pain, frequency, hematuria and urgency.  Musculoskeletal: Negative for back pain, joint pain and myalgias.  Skin: Negative for rash.  Neurological: Negative for dizziness, tingling, focal weakness, seizures, weakness and headaches.  Endo/Heme/Allergies: Does not bruise/bleed easily.  Psychiatric/Behavioral: Negative for depression and suicidal ideas. The patient does not have insomnia.       No Known Allergies   Past Medical History:  Diagnosis Date  . Discoloration of nail   . H/O prostate cancer    was treated with radiation  . Hypertension   . Local recurrence of prostate cancer ([place]) 06/22/2007   Qualifier: Diagnosis of  By: Fuller Plan CMA (AAMA), Lugene    . Shingles      Past Surgical History:  Procedure Laterality Date  . ABDOMINAL SURGERY     ruptured intestines   . CATARACT EXTRACTION W/PHACO Left 01/29/2016   Procedure: CATARACT EXTRACTION PHACO AND INTRAOCULAR LENS PLACEMENT ([place]) left eye;  Surgeon: Leandrew Koyanagi, MD;  Location: [place];  Service: Ophthalmology;  Laterality: Left;  RESTOR LENS  . CATARACT EXTRACTION W/PHACO Right 10/21/2016   Procedure: CATARACT EXTRACTION PHACO AND INTRAOCULAR LENS PLACEMENT ([facility])  Right restor toric lens;  Surgeon:  Leandrew Koyanagi, MD;  Location: [place];  Service: Ophthalmology;  Laterality: Right;  restor toric  lens  . CYSTOSCOPY WITH LITHOLAPAXY N/A 08/14/2016   Procedure: CYSTOSCOPY WITH LITHOLAPAXY;  Surgeon: Nickie Retort, MD;  Location: [facility];  Service: Urology;  Laterality: N/A;  . FEMUR IM NAIL Right 10/08/2014   Procedure: INTRAMEDULLARY (IM) NAIL FEMORAL;  Surgeon: Rod Can, MD;  Location: [place];  Service: Orthopedics;  Laterality: Right;  PER DANIELLE 110 MIN  . HOLMIUM LASER APPLICATION  1/44/8185   Procedure: HOLMIUM LASER APPLICATION;  Surgeon: Nickie Retort, MD;  Location: [facility];  Service: Urology;;  . OTHER SURGICAL HISTORY  2006   Prostate Radiation Surgery     Social History   Socioeconomic History  . Marital status: Married    Spouse name: Not on file  . Number of children: Not on file  . Years of education: Not on file  . Highest education level: Not on file  Occupational History  . Occupation: retired  Scientific laboratory technician  . Financial resource strain: Not on file  . Food insecurity:    Worry: Not on file    Inability: Not on file  . Transportation needs:    Medical: Not on file    Non-medical: Not on file  Tobacco Use  . Smoking status: Former Smoker    Packs/day: 1.00    Years: 15.00    Pack years: 15.00    Types: Cigarettes    Last attempt to quit: 09/14/1965    Years since quitting: 53.0  . Smokeless tobacco: Never Used  Substance and Sexual Activity  . Alcohol use: Yes    Alcohol/week: 7.0 standard drinks    Types: 7 Glasses of wine per week  . Drug use: No  . Sexual activity: Not on file  Lifestyle  . Physical activity:    Days per week: Not on file    Minutes per session: Not on file  . Stress: Not on file  Relationships  . Social connections:    Talks on phone: Not on file    Gets together: Not on file    Attends religious service: Not on file    Active member of club or organization: Not on file    Attends meetings of clubs or organizations: Not on file    Relationship status: Not on file  . Intimate partner violence:    Fear  of current or ex partner: Not on file    Emotionally abused: Not on file    Physically abused: Not on file    Forced sexual activity: Not on file  Other Topics Concern  . Not on file  Social History Narrative  . Not on file    Family History  Problem Relation Age of Onset  . Heart disease Brother   . Heart attack Brother   . Prostate cancer Neg Hx   . Bladder Cancer Neg Hx   . Kidney cancer Neg Hx      Current Outpatient Medications:  .  Ascorbic Acid (VITAMIN C) 1000 MG tablet, Take 1,000 mg by mouth daily., Disp: , Rfl:  .  cholecalciferol (VITAMIN D) 1000 UNITS tablet, Take 1,000 Units by mouth daily., Disp: , Rfl:  .  Glucosamine-Chondroitin (GLUCOSAMINE CHONDR COMPLEX PO), Take 2 capsules by mouth daily., Disp: , Rfl:  .  Multiple Vitamin (MULTIVITAMIN) capsule, Take 1 capsule by mouth daily., Disp: , Rfl:  .  NON FORMULARY, Take 2 tablets by mouth daily., Disp: ,  Rfl:  .  Omega-3 Fatty Acids (FISH OIL) 1200 MG CAPS, Take 1,200 mg by mouth daily., Disp: , Rfl:  .  tamsulosin (FLOMAX) 0.4 MG CAPS capsule, Take 2 capsules (0.8 mg total) by mouth daily., Disp: 60 capsule, Rfl: 11  Physical exam:  Vitals:   09/22/18 1036  BP: (!) 187/78  Pulse: 63  Resp: 18  Temp: (!) 94.6 F (34.8 C)  TempSrc: Tympanic  Weight: 244 lb 14.4 oz (111.1 kg)   Physical Exam Constitutional:      Appearance: He is obese.  HENT:     Head: Normocephalic and atraumatic.  Eyes:     Pupils: Pupils are equal, round, and reactive to light.  Neck:     Musculoskeletal: Normal range of motion.  Cardiovascular:     Rate and Rhythm: Normal rate and regular rhythm.     Heart sounds: Normal heart sounds.  Pulmonary:     Effort: Pulmonary effort is normal.     Breath sounds: Normal breath sounds.  Abdominal:     General: Bowel sounds are normal.     Palpations: Abdomen is soft.  Skin:    General: Skin is warm and dry.  Neurological:     Mental Status: He is alert and oriented to person,  place, and time.      CMP Latest Ref Rng & Units 06/16/2018  Glucose 70 - 99 mg/dL 104(H)  BUN 8 - 23 mg/dL 21  Creatinine 0.61 - 1.24 mg/dL 1.08  Sodium 135 - 145 mmol/L 137  Potassium 3.5 - 5.1 mmol/L 4.3  Chloride 98 - 111 mmol/L 102  CO2 22 - 32 mmol/L 27  Calcium 8.9 - 10.3 mg/dL 9.6  Total Protein 6.5 - 8.1 g/dL 7.5  Total Bilirubin 0.3 - 1.2 mg/dL 0.5  Alkaline Phos 38 - 126 U/L 52  AST 15 - 41 U/L 24  ALT 0 - 44 U/L 29   CBC Latest Ref Rng & Units 06/16/2018  WBC 4.0 - 10.5 K/uL 6.1  Hemoglobin 13.0 - 17.0 g/dL 12.3(L)  Hematocrit 39.0 - 52.0 % 37.7(L)  Platelets 150 - 400 K/uL 201    Assessment and plan- Patient is a 85 y.o. male with castrate resistant nonmetastatic prostate cancer currently on Lupron  I have again reviewed patient's prior PSAs over the last 10 months and his PSA doubling time is around 10 months which is essentially remained unchanged.  He is getting Lupron through urology.  I discussed the results of PROSPER (enzalutamide), SPARTAN (apalutamide) as well as darolutamide which were studied in castrate resistant nonmetastatic prostate cancer and showed improvement in metastases free survival.  None of these have been compared head-to-head and were only compared to ADT alone.  Patients who are on ADT alone had a progression free survival of roughly 16 to 18 months in these trials versus around 40 months with the addition of oral antiandrogen therapy.  All these patients had a PSA doubling time of 10 months or less.  Discussed that adding additional antiandrogen therapy could come with some possible side effects such as hot flashes fatigue anemia worsening bone health.  Among the 3 agents, darolutamide has lesser CNS penetration and may be better tolerated especially in the elderly.  Patient remains concerned about his fatigue and exercise ability getting potentially worse with the addition of these agents and would like to hold off on starting any of these  agents at this time.  I will repeat PSA in 3 months and see him  back in 6 months with CBC CMP and PSA prior. Patient knows that he can get in touch with him anytime he decides to change his mind and opts to go on 1 of these 3 medications  Given that patient is elderly along with ongoing Lupron therapy I would get a baseline bone density scan to assess his bone health and see if he would qualify for bisphosphonates for osteopenia or osteoporosis  Hypertension: Patient reports that his home blood pressure readings typically run in the systolics 062B to 762G.  I have reiterated the importance of good blood pressure control especially to consider starting any of these agents that can have an adverse cardiovascular effects as well.  Patient verbalized understanding   Total face to face encounter time for this patient visit was 30 min. >50% of the time was  spent in counseling and coordination of care.     Visit Diagnosis 1. Prostate cancer ([place])   2. At risk for osteopenia   3. High risk medication use      Dr. Randa Evens, MD, MPH [facility] at [facility] [phone] 09/23/2018 9:49 AM

## 2018-10-11 DIAGNOSIS — M9903 Segmental and somatic dysfunction of lumbar region: Secondary | ICD-10-CM | POA: Diagnosis not present

## 2018-10-11 DIAGNOSIS — M955 Acquired deformity of pelvis: Secondary | ICD-10-CM | POA: Diagnosis not present

## 2018-10-11 DIAGNOSIS — M9905 Segmental and somatic dysfunction of pelvic region: Secondary | ICD-10-CM | POA: Diagnosis not present

## 2018-10-11 DIAGNOSIS — M5416 Radiculopathy, lumbar region: Secondary | ICD-10-CM | POA: Diagnosis not present

## 2018-10-23 ENCOUNTER — Encounter: Payer: Self-pay | Admitting: Oncology

## 2018-10-26 ENCOUNTER — Encounter: Payer: Self-pay | Admitting: *Deleted

## 2018-11-01 ENCOUNTER — Other Ambulatory Visit: Payer: Medicare Other

## 2018-11-08 DIAGNOSIS — M5416 Radiculopathy, lumbar region: Secondary | ICD-10-CM | POA: Diagnosis not present

## 2018-11-08 DIAGNOSIS — M955 Acquired deformity of pelvis: Secondary | ICD-10-CM | POA: Diagnosis not present

## 2018-11-08 DIAGNOSIS — M9905 Segmental and somatic dysfunction of pelvic region: Secondary | ICD-10-CM | POA: Diagnosis not present

## 2018-11-08 DIAGNOSIS — M9903 Segmental and somatic dysfunction of lumbar region: Secondary | ICD-10-CM | POA: Diagnosis not present

## 2018-12-06 DIAGNOSIS — M9905 Segmental and somatic dysfunction of pelvic region: Secondary | ICD-10-CM | POA: Diagnosis not present

## 2018-12-06 DIAGNOSIS — M955 Acquired deformity of pelvis: Secondary | ICD-10-CM | POA: Diagnosis not present

## 2018-12-06 DIAGNOSIS — M9903 Segmental and somatic dysfunction of lumbar region: Secondary | ICD-10-CM | POA: Diagnosis not present

## 2018-12-06 DIAGNOSIS — M5416 Radiculopathy, lumbar region: Secondary | ICD-10-CM | POA: Diagnosis not present

## 2018-12-09 ENCOUNTER — Encounter: Payer: Self-pay | Admitting: Oncology

## 2018-12-19 ENCOUNTER — Ambulatory Visit
Admission: RE | Admit: 2018-12-19 | Discharge: 2018-12-19 | Disposition: A | Discharge status: Home / Self Care | Payer: Medicare Other | Source: Ambulatory Visit | Attending: Oncology | Admitting: Oncology

## 2018-12-19 ENCOUNTER — Other Ambulatory Visit: Payer: Self-pay

## 2018-12-19 DIAGNOSIS — C61 Malignant neoplasm of prostate: Secondary | ICD-10-CM | POA: Diagnosis not present

## 2018-12-19 DIAGNOSIS — M85852 Other specified disorders of bone density and structure, left thigh: Secondary | ICD-10-CM | POA: Diagnosis not present

## 2018-12-19 DIAGNOSIS — Z9189 Other specified personal risk factors, not elsewhere classified: Secondary | ICD-10-CM | POA: Diagnosis not present

## 2018-12-21 ENCOUNTER — Other Ambulatory Visit: Payer: Self-pay

## 2018-12-21 ENCOUNTER — Inpatient Hospital Stay: Payer: Medicare Other | Attending: Oncology

## 2018-12-21 DIAGNOSIS — C61 Malignant neoplasm of prostate: Secondary | ICD-10-CM | POA: Diagnosis not present

## 2018-12-21 LAB — PSA: Prostatic Specific Antigen: 3.15 ng/mL (ref 0.00–4.00)

## 2019-01-03 DIAGNOSIS — M9905 Segmental and somatic dysfunction of pelvic region: Secondary | ICD-10-CM | POA: Diagnosis not present

## 2019-01-03 DIAGNOSIS — M9903 Segmental and somatic dysfunction of lumbar region: Secondary | ICD-10-CM | POA: Diagnosis not present

## 2019-01-03 DIAGNOSIS — M5416 Radiculopathy, lumbar region: Secondary | ICD-10-CM | POA: Diagnosis not present

## 2019-01-03 DIAGNOSIS — M955 Acquired deformity of pelvis: Secondary | ICD-10-CM | POA: Diagnosis not present

## 2019-02-07 DIAGNOSIS — M955 Acquired deformity of pelvis: Secondary | ICD-10-CM | POA: Diagnosis not present

## 2019-02-07 DIAGNOSIS — M5416 Radiculopathy, lumbar region: Secondary | ICD-10-CM | POA: Diagnosis not present

## 2019-02-07 DIAGNOSIS — M9903 Segmental and somatic dysfunction of lumbar region: Secondary | ICD-10-CM | POA: Diagnosis not present

## 2019-02-07 DIAGNOSIS — M9905 Segmental and somatic dysfunction of pelvic region: Secondary | ICD-10-CM | POA: Diagnosis not present

## 2019-02-14 ENCOUNTER — Telehealth: Payer: Self-pay | Admitting: Family Medicine

## 2019-02-14 DIAGNOSIS — R350 Frequency of micturition: Secondary | ICD-10-CM

## 2019-02-14 DIAGNOSIS — N401 Enlarged prostate with lower urinary tract symptoms: Secondary | ICD-10-CM

## 2019-02-14 DIAGNOSIS — N3941 Urge incontinence: Secondary | ICD-10-CM

## 2019-02-14 DIAGNOSIS — C61 Malignant neoplasm of prostate: Secondary | ICD-10-CM

## 2019-02-14 MED ORDER — TAMSULOSIN HCL 0.4 MG PO CAPS: 0.8000 mg | ORAL_CAPSULE | Freq: Every day | ORAL | 11 refills | Status: DC

## 2019-02-14 NOTE — Telephone Encounter (Signed)
A medication refill request was faxed today for Tamsulosin. I do not see any recent notes where he is to continue this medication. Please advise.

## 2019-02-14 NOTE — Telephone Encounter (Signed)
OK to continue flomax, has been on long term  Thanks Nickolas Madrid, MD 02/14/2019

## 2019-03-07 DIAGNOSIS — M955 Acquired deformity of pelvis: Secondary | ICD-10-CM | POA: Diagnosis not present

## 2019-03-07 DIAGNOSIS — M9903 Segmental and somatic dysfunction of lumbar region: Secondary | ICD-10-CM | POA: Diagnosis not present

## 2019-03-07 DIAGNOSIS — M9905 Segmental and somatic dysfunction of pelvic region: Secondary | ICD-10-CM | POA: Diagnosis not present

## 2019-03-07 DIAGNOSIS — M5416 Radiculopathy, lumbar region: Secondary | ICD-10-CM | POA: Diagnosis not present

## 2019-03-24 ENCOUNTER — Inpatient Hospital Stay: Payer: Medicare Other | Attending: Oncology | Admitting: Oncology

## 2019-03-24 ENCOUNTER — Inpatient Hospital Stay: Payer: Medicare Other

## 2019-03-24 ENCOUNTER — Other Ambulatory Visit: Payer: Self-pay

## 2019-03-24 ENCOUNTER — Encounter: Payer: Self-pay | Admitting: Oncology

## 2019-03-24 ENCOUNTER — Other Ambulatory Visit: Payer: Self-pay | Admitting: *Deleted

## 2019-03-24 VITALS — BP 179/73 | HR 71 | Temp 97.2°F | Resp 16 | Ht 67.5 in | Wt 243.0 lb

## 2019-03-24 DIAGNOSIS — Z87891 Personal history of nicotine dependence: Secondary | ICD-10-CM | POA: Insufficient documentation

## 2019-03-24 DIAGNOSIS — C61 Malignant neoplasm of prostate: Secondary | ICD-10-CM | POA: Diagnosis not present

## 2019-03-24 DIAGNOSIS — M8580 Other specified disorders of bone density and structure, unspecified site: Secondary | ICD-10-CM | POA: Diagnosis not present

## 2019-03-24 DIAGNOSIS — I10 Essential (primary) hypertension: Secondary | ICD-10-CM | POA: Diagnosis not present

## 2019-03-24 LAB — CBC WITH DIFFERENTIAL/PLATELET
Abs Immature Granulocytes: 0.02 10*3/uL (ref 0.00–0.07)
Basophils Absolute: 0.1 10*3/uL (ref 0.0–0.1)
Basophils Relative: 1 %
Eosinophils Absolute: 0.3 10*3/uL (ref 0.0–0.5)
Eosinophils Relative: 4 %
HCT: 38.7 % — ABNORMAL LOW (ref 39.0–52.0)
Hemoglobin: 12.8 g/dL — ABNORMAL LOW (ref 13.0–17.0)
Immature Granulocytes: 0 %
Lymphocytes Relative: 19 %
Lymphs Abs: 1.2 10*3/uL (ref 0.7–4.0)
MCH: 31.1 pg (ref 26.0–34.0)
MCHC: 33.1 g/dL (ref 30.0–36.0)
MCV: 94.2 fL (ref 80.0–100.0)
Monocytes Absolute: 0.6 10*3/uL (ref 0.1–1.0)
Monocytes Relative: 10 %
Neutro Abs: 4.3 10*3/uL (ref 1.7–7.7)
Neutrophils Relative %: 66 %
Platelets: 205 10*3/uL (ref 150–400)
RBC: 4.11 MIL/uL — ABNORMAL LOW (ref 4.22–5.81)
RDW: 13.6 % (ref 11.5–15.5)
WBC: 6.5 10*3/uL (ref 4.0–10.5)
nRBC: 0 % (ref 0.0–0.2)

## 2019-03-24 LAB — COMPREHENSIVE METABOLIC PANEL
ALT: 24 U/L (ref 0–44)
AST: 24 U/L (ref 15–41)
Albumin: 4.4 g/dL (ref 3.5–5.0)
Alkaline Phosphatase: 49 U/L (ref 38–126)
Anion gap: 11 (ref 5–15)
BUN: 16 mg/dL (ref 8–23)
CO2: 27 mmol/L (ref 22–32)
Calcium: 9.8 mg/dL (ref 8.9–10.3)
Chloride: 103 mmol/L (ref 98–111)
Creatinine, Ser: 1.01 mg/dL (ref 0.61–1.24)
GFR calc Af Amer: >60 mL/min (ref >60)
GFR calc non Af Amer: >60 mL/min (ref >60)
Glucose, Bld: 125 mg/dL — ABNORMAL HIGH (ref 70–99)
Potassium: 5.2 mmol/L — ABNORMAL HIGH (ref 3.5–5.1)
Sodium: 141 mmol/L (ref 135–145)
Total Bilirubin: 0.7 mg/dL (ref 0.3–1.2)
Total Protein: 7.8 g/dL (ref 6.5–8.1)

## 2019-03-24 LAB — PSA: Prostatic Specific Antigen: 4.01 ng/mL — ABNORMAL HIGH (ref 0.00–4.00)

## 2019-03-24 NOTE — Progress Notes (Signed)
Hematology/Oncology Consult note [facility]  [phone] [phone]  Patient Care Team: [place] Loffler, MD as PCP - General Leandrew Koyanagi, MD as Referring Physician (Ophthalmology) Nickie Retort, MD as Consulting Physician (Urology)   Name of the patient: Cesar Powell  [id number]  [birth year]   Date of visit: 03/24/19  Diagnosis-castrate resistant nonmetastatic prostate cancer  Chief complaint/ Reason for visit-routine follow-up of prostate cancer  Heme/Onc history: patient is a 85 year old gentleman with no significant past medical problems. He has a history of prostate cancer that was diagnosed and treated with radiation in 2005. At that point he his PSA was about 0.03 for a long time. Then started on Axiron for low testosterone. PSA eventually went up to as high as 18 2015. At that point he was started on Lupron his PSA doubling time is around 10 months. Most recently since October 2018 after his PSA went down from 3.6-1.7 and has been gradually increasing to 1.3,1.5and 2 indicating a PSAdoublingtime of 9.9 months.Testosterone levels continue to be suppressed.Patient is tolerating his Lupron well except for some mild fatigue and hot flashes. He is fit and continues to exercise.he has not started oral anti androgen therapy yet.   patient was last seen by me in September 2019 discussed adding oral antiandrogens like apalutamide versus enzalutamide to Lupron in castrate resistant nonmetastatic prostate cancer.  After discussing risks and benefits patient did not wish to proceed with that just yet and continues to be on Lupron alone.  Interval history-patient states he does not like to be on Lupron.  It has been making him feel fatigued and he has poor exercise tolerance and unable to lose weight because of that.  ECOG PS- 1 Pain scale- 0 Opioid associated constipation- no  Review of systems- Review of Systems    Constitutional: Positive for malaise/fatigue. Negative for chills, fever and weight loss.  HENT: Negative for congestion, ear discharge and nosebleeds.   Eyes: Negative for blurred vision.  Respiratory: Negative for cough, hemoptysis, sputum production, shortness of breath and wheezing.   Cardiovascular: Negative for chest pain, palpitations, orthopnea and claudication.  Gastrointestinal: Negative for abdominal pain, blood in stool, constipation, diarrhea, heartburn, melena, nausea and vomiting.  Genitourinary: Negative for dysuria, flank pain, frequency, hematuria and urgency.  Musculoskeletal: Negative for back pain, joint pain and myalgias.  Skin: Negative for rash.  Neurological: Negative for dizziness, tingling, focal weakness, seizures, weakness and headaches.  Endo/Heme/Allergies: Does not bruise/bleed easily.  Psychiatric/Behavioral: Negative for depression and suicidal ideas. The patient does not have insomnia.        No Known Allergies   Past Medical History:  Diagnosis Date   Discoloration of nail    H/O prostate cancer    was treated with radiation   Hypertension    Local recurrence of prostate cancer ([place]) 06/22/2007   Qualifier: Diagnosis of  By: Fuller Plan CMA (AAMA), Lugene     Shingles      Past Surgical History:  Procedure Laterality Date   ABDOMINAL SURGERY     ruptured intestines    CATARACT EXTRACTION W/PHACO Left 01/29/2016   Procedure: CATARACT EXTRACTION PHACO AND INTRAOCULAR LENS PLACEMENT ([place]) left eye;  Surgeon: Leandrew Koyanagi, MD;  Location: [place];  Service: Ophthalmology;  Laterality: Left;  RESTOR LENS   CATARACT EXTRACTION W/PHACO Right 10/21/2016   Procedure: CATARACT EXTRACTION PHACO AND INTRAOCULAR LENS PLACEMENT ([facility])  Right restor toric lens;  Surgeon: Leandrew Koyanagi, MD;  Location: [place];  Service: Ophthalmology;  Laterality: Right;  restor toric lens   CYSTOSCOPY WITH LITHOLAPAXY N/A 08/14/2016    Procedure: CYSTOSCOPY WITH LITHOLAPAXY;  Surgeon: Nickie Retort, MD;  Location: [facility];  Service: Urology;  Laterality: N/A;   FEMUR IM NAIL Right 10/08/2014   Procedure: INTRAMEDULLARY (IM) NAIL FEMORAL;  Surgeon: Rod Can, MD;  Location: [place];  Service: Orthopedics;  Laterality: Right;  PER DANIELLE 110 MIN   HOLMIUM LASER APPLICATION  [id number]   Procedure: HOLMIUM LASER APPLICATION;  Surgeon: Nickie Retort, MD;  Location: [facility];  Service: Urology;;   OTHER SURGICAL HISTORY  2006   Prostate Radiation Surgery     Social History   Socioeconomic History   Marital status: Married    Spouse name: Not on file   Number of children: Not on file   Years of education: Not on file   Highest education level: Not on file  Occupational History   Occupation: retired  Scientist, product/process development strain: Not on file   Food insecurity    Worry: Not on file    Inability: Not on Lexicographer needs    Medical: Not on file    Non-medical: Not on file  Tobacco Use   Smoking status: Former Smoker    Packs/day: 1.00    Years: 15.00    Pack years: 15.00    Types: Cigarettes    Quit date: 09/14/1965    Years since quitting: 53.5   Smokeless tobacco: Never Used  Substance and Sexual Activity   Alcohol use: Yes    Alcohol/week: 7.0 standard drinks    Types: 7 Glasses of wine per week   Drug use: No   Sexual activity: Not on file  Lifestyle   Physical activity    Days per week: Not on file    Minutes per session: Not on file   Stress: Not on file  Relationships   Social connections    Talks on phone: Not on file    Gets together: Not on file    Attends religious service: Not on file    Active member of club or organization: Not on file    Attends meetings of clubs or organizations: Not on file    Relationship status: Not on file   Intimate partner violence    Fear of current or ex partner: Not on file    Emotionally abused: Not on  file    Physically abused: Not on file    Forced sexual activity: Not on file  Other Topics Concern   Not on file  Social History Narrative   Not on file    Family History  Problem Relation Age of Onset   Heart disease Brother    Heart attack Brother    Prostate cancer Neg Hx    Bladder Cancer Neg Hx    Kidney cancer Neg Hx      Current Outpatient Medications:    Ascorbic Acid (VITAMIN C) 1000 MG tablet, Take 1,000 mg by mouth daily., Disp: , Rfl:    cholecalciferol (VITAMIN D) 1000 UNITS tablet, Take 1,000 Units by mouth daily., Disp: , Rfl:    Glucosamine-Chondroitin (GLUCOSAMINE CHONDR COMPLEX PO), Take 2 capsules by mouth daily., Disp: , Rfl:    Multiple Vitamin (MULTIVITAMIN) capsule, Take 1 capsule by mouth daily., Disp: , Rfl:    NON FORMULARY, Take 2 tablets by mouth daily., Disp: , Rfl:    Omega-3 Fatty Acids (FISH OIL) 1200 MG  CAPS, Take 1,200 mg by mouth daily., Disp: , Rfl:    tamsulosin (FLOMAX) 0.4 MG CAPS capsule, Take 2 capsules (0.8 mg total) by mouth daily., Disp: 60 capsule, Rfl: 11  Physical exam: There were no vitals filed for this visit. Physical Exam Constitutional:      Appearance: He is obese.     Comments: Appears younger than stated age  HENT:     Head: Normocephalic and atraumatic.  Eyes:     Pupils: Pupils are equal, round, and reactive to light.  Neck:     Musculoskeletal: Normal range of motion.  Cardiovascular:     Rate and Rhythm: Normal rate and regular rhythm.     Heart sounds: Normal heart sounds.  Pulmonary:     Effort: Pulmonary effort is normal.     Breath sounds: Normal breath sounds.  Abdominal:     General: Bowel sounds are normal.     Palpations: Abdomen is soft.  Skin:    General: Skin is warm and dry.  Neurological:     Mental Status: He is alert and oriented to person, place, and time.      CMP Latest Ref Rng & Units 06/16/2018  Glucose 70 - 99 mg/dL 104(H)  BUN 8 - 23 mg/dL 21  Creatinine 0.61 -  1.24 mg/dL 1.08  Sodium 135 - 145 mmol/L 137  Potassium 3.5 - 5.1 mmol/L 4.3  Chloride 98 - 111 mmol/L 102  CO2 22 - 32 mmol/L 27  Calcium 8.9 - 10.3 mg/dL 9.6  Total Protein 6.5 - 8.1 g/dL 7.5  Total Bilirubin 0.3 - 1.2 mg/dL 0.5  Alkaline Phos 38 - 126 U/L 52  AST 15 - 41 U/L 24  ALT 0 - 44 U/L 29   CBC Latest Ref Rng & Units 06/16/2018  WBC 4.0 - 10.5 K/uL 6.1  Hemoglobin 13.0 - 17.0 g/dL 12.3(L)  Hematocrit 39.0 - 52.0 % 37.7(L)  Platelets 150 - 400 K/uL 201      Assessment and plan- Patient is a 85 y.o. male with castrate resistant nonmetastatic prostate cancer here for routine follow-up  Patient's PSA from today is currently pending.  He does have castrate resistant nonmetastatic prostate cancer with a PSA doubling time of roughly 10 months.  He remains on Lupron but would like to take a break from Lupron if possible.  He gets this through urology.  He is not interested in starting any additional antiandrogen therapy for his prostate cancer.  Osteopenia: Discussed the results of the bone density scan which showed a T score of -1.4 at the femur neck.  Is 10-year probability of a major osteoporotic fracture was 8.9% and hip fracture was 2.9%.  Denosumab can potentially be considered in his case every 6 months to reduce the risk of skeletal related fractures in the setting of ADT.  Discussed possible risk of side effects of denosumab including all but not limited to hypocalcemia and osteonecrosis of the jaw.  Patient understands and does not wish to proceed with denosumab at this time he will continue calcium and vitamin D  I will repeat PSA in 3 months in 6 months and see him back in 6 months.  Also check a CBC with differential and CMP at the same time.   Visit Diagnosis 1. Prostate cancer ([place])   2. Osteopenia, unspecified location      Dr. Randa Evens, MD, MPH [facility] at [facility] [id number] 03/24/2019 10:57 AM

## 2019-03-27 ENCOUNTER — Other Ambulatory Visit: Payer: Self-pay

## 2019-03-27 ENCOUNTER — Other Ambulatory Visit: Payer: Medicare Other

## 2019-03-29 ENCOUNTER — Encounter: Payer: Self-pay | Admitting: Urology

## 2019-03-29 ENCOUNTER — Other Ambulatory Visit: Payer: Self-pay | Admitting: *Deleted

## 2019-03-29 ENCOUNTER — Other Ambulatory Visit: Payer: Self-pay

## 2019-03-29 ENCOUNTER — Telehealth: Payer: Self-pay | Admitting: *Deleted

## 2019-03-29 ENCOUNTER — Ambulatory Visit (INDEPENDENT_AMBULATORY_CARE_PROVIDER_SITE_OTHER): Payer: Medicare Other | Admitting: Urology

## 2019-03-29 VITALS — BP 152/65 | HR 79 | Ht 67.0 in | Wt 234.0 lb

## 2019-03-29 DIAGNOSIS — E875 Hyperkalemia: Secondary | ICD-10-CM

## 2019-03-29 DIAGNOSIS — C61 Malignant neoplasm of prostate: Secondary | ICD-10-CM | POA: Diagnosis not present

## 2019-03-29 NOTE — Progress Notes (Signed)
   03/29/2019 10:17 AM   [name] 08/23/1930 [id number]  Reason for visit: Follow up biochemical recurrence prostate cancer  HPI: I saw Mr. Cesar Powell back in urology clinic for follow-up of biochemical recurrence of prostate cancer.  To briefly summarize he is a very healthy 85 year old male who was diagnosed with prostate cancer in 2005 and treated with radiation.  His outside prior records are unavailable to me.  He was then started on testosterone by different provider and his PSA rose to as high as 18, his testosterone was stopped at that time and he was started on Lupron.  His PSA dropped to as low as 0.4 on Lupron, however PSA has now risen to 4.01.  His PSA doubling time is 11.4.  Last Lupron injection was February 2020.  He was evaluated by medical oncology for possible additional antiandrogen therapy.  They performed staging imaging with CT chest abdomen and pelvis, as well as bone scan in September 2019 which was all negative for metastatic disease.  They offered him both apalutamide, enzalutamide, and denosumab, however he was hesitant to start any additional therapy secondary to possible side effects.   He also has a history of a right femur fracture and is on vitamin D and calcium supplementation since that time.  He also has a history of lower urinary tract symptoms and history of cystolitholapaxy.  His urinary symptoms are currently well controlled on Flomax.  He denies any bone pain, weight loss.   PSA today is 4.01, PSA doubling time 11.4 months.  He recently met with medical oncology and would like to discontinue ADT.  He feels he has a very difficult time exercising, and is having problems with weight gain.  We discussed at length the risks of progression off ADT as well as developing metastatic disease and/or symptoms.  He would like to repeat a PSA in 3 months and rediscuss possible ADT or additional antiandrogen therapy at that time.  He has scheduled follow-up  with medical oncology in 6 months as well.  Repeat PSA in 3 months, virtual visit to discuss  A total of 15 minutes were spent face-to-face with the patient, greater than 50% was spent in patient education, counseling, and coordination of care regarding non-metastatic castrate resistant prostate cancer and treatment options.   Billey Co, [facility] [address] [place], [place] [address] [phone]

## 2019-03-29 NOTE — Telephone Encounter (Signed)
Pt lab was potassium 5.2 which is just 1 /10 appt above the normal level . However with potassium we like eveyone to be in the middle range. Sometimes if it is too low or too high it can have cardiac rhythm changes. Dr. Janese Banks wants pt to come back on this Friday to get labs checked again. I have left this on his voicemail at home and wait for his call back. I have left my direct # . I will try again if no call back at end of day

## 2019-03-30 ENCOUNTER — Other Ambulatory Visit: Payer: Self-pay

## 2019-03-30 ENCOUNTER — Ambulatory Visit: Payer: Medicare Other | Admitting: Urology

## 2019-03-31 ENCOUNTER — Other Ambulatory Visit: Payer: Self-pay

## 2019-03-31 ENCOUNTER — Inpatient Hospital Stay: Payer: Medicare Other

## 2019-03-31 DIAGNOSIS — E875 Hyperkalemia: Secondary | ICD-10-CM

## 2019-03-31 DIAGNOSIS — I10 Essential (primary) hypertension: Secondary | ICD-10-CM | POA: Diagnosis not present

## 2019-03-31 DIAGNOSIS — M8580 Other specified disorders of bone density and structure, unspecified site: Secondary | ICD-10-CM | POA: Diagnosis not present

## 2019-03-31 DIAGNOSIS — Z87891 Personal history of nicotine dependence: Secondary | ICD-10-CM | POA: Diagnosis not present

## 2019-03-31 DIAGNOSIS — C61 Malignant neoplasm of prostate: Secondary | ICD-10-CM | POA: Diagnosis not present

## 2019-03-31 LAB — BASIC METABOLIC PANEL
Anion gap: 10 (ref 5–15)
BUN: 15 mg/dL (ref 8–23)
CO2: 27 mmol/L (ref 22–32)
Calcium: 9.6 mg/dL (ref 8.9–10.3)
Chloride: 102 mmol/L (ref 98–111)
Creatinine, Ser: 1.2 mg/dL (ref 0.61–1.24)
GFR calc Af Amer: >60 mL/min (ref >60)
GFR calc non Af Amer: 54 mL/min — ABNORMAL LOW (ref >60)
Glucose, Bld: 152 mg/dL — ABNORMAL HIGH (ref 70–99)
Potassium: 4.8 mmol/L (ref 3.5–5.1)
Sodium: 139 mmol/L (ref 135–145)

## 2019-04-04 DIAGNOSIS — M955 Acquired deformity of pelvis: Secondary | ICD-10-CM | POA: Diagnosis not present

## 2019-04-04 DIAGNOSIS — M5416 Radiculopathy, lumbar region: Secondary | ICD-10-CM | POA: Diagnosis not present

## 2019-04-04 DIAGNOSIS — M9905 Segmental and somatic dysfunction of pelvic region: Secondary | ICD-10-CM | POA: Diagnosis not present

## 2019-04-04 DIAGNOSIS — M9903 Segmental and somatic dysfunction of lumbar region: Secondary | ICD-10-CM | POA: Diagnosis not present

## 2019-05-02 DIAGNOSIS — M9903 Segmental and somatic dysfunction of lumbar region: Secondary | ICD-10-CM | POA: Diagnosis not present

## 2019-05-02 DIAGNOSIS — M9905 Segmental and somatic dysfunction of pelvic region: Secondary | ICD-10-CM | POA: Diagnosis not present

## 2019-05-02 DIAGNOSIS — M5416 Radiculopathy, lumbar region: Secondary | ICD-10-CM | POA: Diagnosis not present

## 2019-05-02 DIAGNOSIS — M955 Acquired deformity of pelvis: Secondary | ICD-10-CM | POA: Diagnosis not present

## 2019-05-30 ENCOUNTER — Telehealth: Payer: Self-pay | Admitting: Family Medicine

## 2019-05-30 DIAGNOSIS — M5416 Radiculopathy, lumbar region: Secondary | ICD-10-CM | POA: Diagnosis not present

## 2019-05-30 DIAGNOSIS — M9905 Segmental and somatic dysfunction of pelvic region: Secondary | ICD-10-CM | POA: Diagnosis not present

## 2019-05-30 DIAGNOSIS — M955 Acquired deformity of pelvis: Secondary | ICD-10-CM | POA: Diagnosis not present

## 2019-05-30 DIAGNOSIS — M9903 Segmental and somatic dysfunction of lumbar region: Secondary | ICD-10-CM | POA: Diagnosis not present

## 2019-05-30 NOTE — Telephone Encounter (Signed)
I usually let urology manage these psa's with patients with prostate cancer.  Particularly with advanced prostate cancer.   It looks like he has an open order from urology.  Truthfully, I would not know how to handle it and would entirely defer to urology.

## 2019-05-30 NOTE — Telephone Encounter (Signed)
Spoke with patient about upcoming appointments. He wants to know if Dr. Lorelei Pont would include PSA with his blood work he is having on 06/12/19. I told him I wasn't certain but would ask. Please advise.

## 2019-05-31 ENCOUNTER — Other Ambulatory Visit: Payer: Self-pay | Admitting: Family Medicine

## 2019-05-31 DIAGNOSIS — C61 Malignant neoplasm of prostate: Secondary | ICD-10-CM

## 2019-05-31 NOTE — Telephone Encounter (Signed)
done

## 2019-06-06 ENCOUNTER — Ambulatory Visit: Payer: Medicare Other

## 2019-06-08 ENCOUNTER — Other Ambulatory Visit: Payer: Self-pay | Admitting: Family Medicine

## 2019-06-08 DIAGNOSIS — C61 Malignant neoplasm of prostate: Secondary | ICD-10-CM

## 2019-06-08 DIAGNOSIS — Z0000 Encounter for general adult medical examination without abnormal findings: Secondary | ICD-10-CM

## 2019-06-08 DIAGNOSIS — Z79899 Other long term (current) drug therapy: Secondary | ICD-10-CM

## 2019-06-12 ENCOUNTER — Other Ambulatory Visit (INDEPENDENT_AMBULATORY_CARE_PROVIDER_SITE_OTHER): Payer: Medicare Other

## 2019-06-12 ENCOUNTER — Ambulatory Visit (INDEPENDENT_AMBULATORY_CARE_PROVIDER_SITE_OTHER): Payer: Medicare Other

## 2019-06-12 DIAGNOSIS — C61 Malignant neoplasm of prostate: Secondary | ICD-10-CM | POA: Diagnosis not present

## 2019-06-12 DIAGNOSIS — Z0000 Encounter for general adult medical examination without abnormal findings: Secondary | ICD-10-CM

## 2019-06-12 DIAGNOSIS — Z79899 Other long term (current) drug therapy: Secondary | ICD-10-CM

## 2019-06-12 LAB — CBC WITH DIFFERENTIAL/PLATELET
Basophils Absolute: 0 10*3/uL (ref 0.0–0.1)
Basophils Relative: 0.8 % (ref 0.0–3.0)
Eosinophils Absolute: 0.2 10*3/uL (ref 0.0–0.7)
Eosinophils Relative: 3.5 % (ref 0.0–5.0)
HCT: 36.8 % — ABNORMAL LOW (ref 39.0–52.0)
Hemoglobin: 12.1 g/dL — ABNORMAL LOW (ref 13.0–17.0)
Lymphocytes Relative: 19.3 % (ref 12.0–46.0)
Lymphs Abs: 1.2 10*3/uL (ref 0.7–4.0)
MCHC: 33 g/dL (ref 30.0–36.0)
MCV: 95 fl (ref 78.0–100.0)
Monocytes Absolute: 0.5 10*3/uL (ref 0.1–1.0)
Monocytes Relative: 8.6 % (ref 3.0–12.0)
Neutro Abs: 4.1 10*3/uL (ref 1.4–7.7)
Neutrophils Relative %: 67.8 % (ref 43.0–77.0)
Platelets: 204 10*3/uL (ref 150.0–400.0)
RBC: 3.87 Mil/uL — ABNORMAL LOW (ref 4.22–5.81)
RDW: 13.8 % (ref 11.5–15.5)
WBC: 6 10*3/uL (ref 4.0–10.5)

## 2019-06-12 LAB — LIPID PANEL
Cholesterol: 198 mg/dL (ref 0–200)
HDL: 51.4 mg/dL (ref >39.00)
LDL Cholesterol: 123 mg/dL — ABNORMAL HIGH (ref 0–99)
NonHDL: 146.37
Total CHOL/HDL Ratio: 4
Triglycerides: 117 mg/dL (ref 0.0–149.0)
VLDL: 23.4 mg/dL (ref 0.0–40.0)

## 2019-06-12 LAB — BASIC METABOLIC PANEL
BUN: 15 mg/dL (ref 6–23)
CO2: 30 mEq/L (ref 19–32)
Calcium: 9.5 mg/dL (ref 8.4–10.5)
Chloride: 105 mEq/L (ref 96–112)
Creatinine, Ser: 1.1 mg/dL (ref 0.40–1.50)
GFR: 63.35 mL/min (ref >60.00)
Glucose, Bld: 120 mg/dL — ABNORMAL HIGH (ref 70–99)
Potassium: 4.7 mEq/L (ref 3.5–5.1)
Sodium: 143 mEq/L (ref 135–145)

## 2019-06-12 LAB — HEPATIC FUNCTION PANEL
ALT: 18 U/L (ref 0–53)
AST: 17 U/L (ref 0–37)
Albumin: 4.4 g/dL (ref 3.5–5.2)
Alkaline Phosphatase: 53 U/L (ref 39–117)
Bilirubin, Direct: 0.1 mg/dL (ref 0.0–0.3)
Total Bilirubin: 0.5 mg/dL (ref 0.2–1.2)
Total Protein: 6.8 g/dL (ref 6.0–8.3)

## 2019-06-12 NOTE — Progress Notes (Signed)
Subjective:   Cesar Powell is a 85 y.o. male who presents for Medicare Annual/Subsequent preventive examination.  Review of Systems: N/A   This visit is being conducted through telemedicine via telephone at the nurse health advisor's home address due to the COVID-19 pandemic. This patient has given me verbal consent via doximity to conduct this visit, patient states they are participating from their home address. Patient and myself are on the telephone call. There is no referral for this visit. Some vital signs may be absent or patient reported.    Patient identification: identified by name, DOB, and current address   Cardiac Risk Factors include: advanced age (>50men, >108 women);hypertension;male gender     Objective:    Vitals: There were no vitals taken for this visit.  There is no height or weight on file to calculate BMI.  Advanced Directives 06/12/2019 03/24/2019 09/22/2018 06/03/2018 04/08/2018 03/25/2018 06/02/2017  Does Patient Have a Medical Advance Directive? No No No No No No No  Would patient like information on creating a medical advance directive? No - Patient declined No - Patient declined No - Patient declined Yes (MAU/Ambulatory/Procedural Areas - Information given) No - Patient declined No - Patient declined No - Patient declined    Tobacco Social History   Tobacco Use  Smoking Status Former Smoker  . Packs/day: 1.00  . Years: 15.00  . Pack years: 15.00  . Types: Cigarettes  . Quit date: 09/14/1965  . Years since quitting: 53.7  Smokeless Tobacco Never Used     Counseling given: Not Answered   Clinical Intake:  Pre-visit preparation completed: Yes  Pain : No/denies pain     Nutritional Risks: None Diabetes: No  How often do you need to have someone help you when you read instructions, pamphlets, or other written materials from your doctor or pharmacy?: 1 - Never What is the last grade level you completed in school?: Bachelor's  Interpreter  Needed?: No  Information entered by :: CJohnson, LPN  Past Medical History:  Diagnosis Date  . Discoloration of nail   . H/O prostate cancer    was treated with radiation  . Hypertension   . Local recurrence of prostate cancer ([place]) 06/22/2007   Qualifier: Diagnosis of  By: Fuller Plan CMA (AAMA), [place]    . Shingles    Past Surgical History:  Procedure Laterality Date  . ABDOMINAL SURGERY     ruptured intestines   . CATARACT EXTRACTION W/PHACO Left 01/29/2016   Procedure: CATARACT EXTRACTION PHACO AND INTRAOCULAR LENS PLACEMENT ([place]) left eye;  Surgeon: Leandrew Koyanagi, MD;  Location: [facility];  Service: Ophthalmology;  Laterality: Left;  RESTOR LENS  . CATARACT EXTRACTION W/PHACO Right 10/21/2016   Procedure: CATARACT EXTRACTION PHACO AND INTRAOCULAR LENS PLACEMENT ([facility])  Right restor toric lens;  Surgeon: Leandrew Koyanagi, MD;  Location: [facility];  Service: Ophthalmology;  Laterality: Right;  restor toric lens  . CYSTOSCOPY WITH LITHOLAPAXY N/A 08/14/2016   Procedure: CYSTOSCOPY WITH LITHOLAPAXY;  Surgeon: Nickie Retort, MD;  Location: [facility];  Service: Urology;  Laterality: N/A;  . FEMUR IM NAIL Right 10/08/2014   Procedure: INTRAMEDULLARY (IM) NAIL FEMORAL;  Surgeon: Rod Can, MD;  Location: [facility];  Service: Orthopedics;  Laterality: Right;  PER DANIELLE 110 MIN  . HOLMIUM LASER APPLICATION  [id number]   Procedure: HOLMIUM LASER APPLICATION;  Surgeon: Nickie Retort, MD;  Location: [facility];  Service: Urology;;  . OTHER SURGICAL HISTORY  2006   Prostate Radiation Surgery  Family History  Problem Relation Age of Onset  . Heart disease Brother   . Heart attack Brother   . Prostate cancer Neg Hx   . Bladder Cancer Neg Hx   . Kidney cancer Neg Hx    Social History   Socioeconomic History  . Marital status: Married    Spouse name: Not on file  . Number of children: Not on file  . Years of education: Not on file  . Highest education  level: Not on file  Occupational History  . Occupation: retired  Scientific laboratory technician  . Financial resource strain: Not hard at all  . Food insecurity    Worry: Never true    Inability: Never true  . Transportation needs    Medical: No    Non-medical: No  Tobacco Use  . Smoking status: Former Smoker    Packs/day: 1.00    Years: 15.00    Pack years: 15.00    Types: Cigarettes    Quit date: 09/14/1965    Years since quitting: 53.7  . Smokeless tobacco: Never Used  Substance and Sexual Activity  . Alcohol use: Not Currently  . Drug use: No  . Sexual activity: Not on file  Lifestyle  . Physical activity    Days per week: 7 days    Minutes per session: 30 min  . Stress: Not at all  Relationships  . Social Herbalist on phone: Not on file    Gets together: Not on file    Attends religious service: Not on file    Active member of club or organization: Not on file    Attends meetings of clubs or organizations: Not on file    Relationship status: Not on file  Other Topics Concern  . Not on file  Social History Narrative  . Not on file    Outpatient Encounter Medications as of 06/12/2019  Medication Sig  . Ascorbic Acid (VITAMIN C) 1000 MG tablet Take 1,000 mg by mouth daily.  . cholecalciferol (VITAMIN D) 1000 UNITS tablet Take 1,000 Units by mouth daily.  . Glucosamine-Chondroitin (GLUCOSAMINE CHONDR COMPLEX PO) Take 2 capsules by mouth daily.  . Multiple Vitamin (MULTIVITAMIN) capsule Take 1 capsule by mouth daily.  . NON FORMULARY Take 2 tablets by mouth daily.  . Omega-3 Fatty Acids (FISH OIL) 1200 MG CAPS Take 1,200 mg by mouth daily.  . tamsulosin (FLOMAX) 0.4 MG CAPS capsule Take 2 capsules (0.8 mg total) by mouth daily.   No facility-administered encounter medications on file as of 06/12/2019.     Activities of Daily Living In your present state of health, do you have any difficulty performing the following activities: 06/12/2019  Hearing? Y  Comment hearing  aids  Vision? N  Difficulty concentrating or making decisions? N  Walking or climbing stairs? N  Dressing or bathing? N  Doing errands, shopping? N  Preparing Food and eating ? N  Using the Toilet? N  In the past six months, have you accidently leaked urine? N  Do you have problems with loss of bowel control? N  Managing your Medications? N  Managing your Finances? N  Housekeeping or managing your Housekeeping? N  Some recent data might be hidden    Patient Care Team: Owens Loffler, MD as PCP - General Leandrew Koyanagi, MD as Referring Physician (Ophthalmology) Nickie Retort, MD as Consulting Physician (Urology)   Assessment:   This is a routine wellness examination for Deriel.  Exercise Activities and  Dietary recommendations Current Exercise Habits: Home exercise routine, Type of exercise: treadmill, Time (Minutes): 30, Frequency (Times/Week): 7, Weekly Exercise (Minutes/Week): 210, Intensity: Moderate, Exercise limited by: None identified  Goals    . Increase physical activity     Starting 06/03/2018, I will continue to exercise for 30-75 minutes 5 days per week.     . Patient Stated     06/12/2019, I want to maintain and continue medications as prescribed.        Fall Risk Fall Risk  06/12/2019 06/03/2018 06/02/2017 10/21/2015  Falls in the past year? 0 0 No No  Number falls in past yr: 0 - - -  Injury with Fall? 0 - - -  Follow up Falls prevention discussed;Falls evaluation completed - - -   Is the patient's home free of loose throw rugs in walkways, pet beds, electrical cords, etc?   yes      Grab bars in the bathroom? yes      Handrails on the stairs?   yes      Adequate lighting?   yes  Timed Get Up and Go Performed: N/A  Depression Screen PHQ 2/9 Scores 06/12/2019 06/03/2018 06/02/2017 10/21/2015  PHQ - 2 Score 0 0 0 0  PHQ- 9 Score 0 0 0 -    Cognitive Function MMSE - Mini Mental State Exam 06/12/2019 06/03/2018 06/02/2017  Orientation to time 5  5 5   Orientation to Place 5 5 5   Registration 3 3 3   Attention/ Calculation 5 0 0  Recall 3 1 3   Recall-comments - unable to recall 2 of 3 words -  Language- name 2 objects - 0 0  Language- repeat 1 1 1   Language- follow 3 step command - 3 3  Language- read & follow direction - 0 0  Write a sentence - 0 0  Copy design - 0 0  Total score - 18 20  Mini Cog  Mini-Cog screen was completed. Maximum score is 22. A value of 0 denotes this part of the MMSE was not completed or the patient failed this part of the Mini-Cog screening.       Immunization History  Administered Date(s) Administered  . Influenza Whole 09/05/2009  . Influenza,inj,Quad PF,6+ Mos 06/10/2017, 08/15/2018  . Pneumococcal Conjugate-13 06/10/2017  . Pneumococcal Polysaccharide-23 09/05/2009  . Td 09/05/2009  . Zoster 09/05/2009    Qualifies for Shingles Vaccine? Yes  Screening Tests Health Maintenance  Topic Date Due  . INFLUENZA VACCINE  03/04/2019  . TETANUS/TDAP  09/06/2019  . PNA vac Low Risk Adult  Completed   Cancer Screenings: Lung: Low Dose CT Chest recommended if Age 75-80 years, 30 pack-year currently smoking OR have quit w/in 15years. Patient does not qualify. Colorectal: no longer required  Additional Screenings:  Hepatitis C Screening: N/A      Plan:    Patient will maintain and continue medications as prescribed.   I have personally reviewed and noted the following in the patient's chart:   . Medical and social history . Use of alcohol, tobacco or illicit drugs  . Current medications and supplements . Functional ability and status . Nutritional status . Physical activity . Advanced directives . List of other physicians . Hospitalizations, surgeries, and ER visits in previous 12 months . Vitals . Screenings to include cognitive, depression, and falls . Referrals and appointments  In addition, I have reviewed and discussed with patient certain preventive protocols, quality  metrics, and best practice recommendations. A written personalized care  plan for preventive services as well as general preventive health recommendations were provided to patient.     Andrez Grime, LPN  [id number]

## 2019-06-12 NOTE — Patient Instructions (Signed)
Cesar Powell , Thank you for taking time to come for your Medicare Wellness Visit. I appreciate your ongoing commitment to your health goals. Please review the following plan we discussed and let me know if I can assist you in the future.   Screening recommendations/referrals: Colonoscopy: no longer required Recommended yearly ophthalmology/optometry visit for glaucoma screening and checkup Recommended yearly dental visit for hygiene and checkup  Vaccinations: Influenza vaccine: will discuss with provider Pneumococcal vaccine: Completed series Tdap vaccine: Up to date, completed 09/05/2009 Shingles vaccine: will discuss with provider    Advanced directives: Advance directive discussed with you today. Even though you declined this today please call our office should you change your mind and we can give you the proper paperwork for you to fill out.   Conditions/risks identified: hypertension  Next appointment: 06/19/2019 @ 2:20 pm   Preventive Care 65 Years and Older, Male Preventive care refers to lifestyle choices and visits with your health care provider that can promote health and wellness. What does preventive care include?  A yearly physical exam. This is also called an annual well check.  Dental exams once or twice a year.  Routine eye exams. Ask your health care provider how often you should have your eyes checked.  Personal lifestyle choices, including:  Daily care of your teeth and gums.  Regular physical activity.  Eating a healthy diet.  Avoiding tobacco and drug use.  Limiting alcohol use.  Practicing safe sex.  Taking low doses of aspirin every day.  Taking vitamin and mineral supplements as recommended by your health care provider. What happens during an annual well check? The services and screenings done by your health care provider during your annual well check will depend on your age, overall health, lifestyle risk factors, and family history of disease.  Counseling  Your health care provider may ask you questions about your:  Alcohol use.  Tobacco use.  Drug use.  Emotional well-being.  Home and relationship well-being.  Sexual activity.  Eating habits.  History of falls.  Memory and ability to understand (cognition).  Work and work Statistician. Screening  You may have the following tests or measurements:  Height, weight, and BMI.  Blood pressure.  Lipid and cholesterol levels. These may be checked every 5 years, or more frequently if you are over 58 years old.  Skin check.  Lung cancer screening. You may have this screening every year starting at age 71 if you have a 30-pack-year history of smoking and currently smoke or have quit within the past 15 years.  Fecal occult blood test (FOBT) of the stool. You may have this test every year starting at age 71.  Flexible sigmoidoscopy or colonoscopy. You may have a sigmoidoscopy every 5 years or a colonoscopy every 10 years starting at age 25.  Prostate cancer screening. Recommendations will vary depending on your family history and other risks.  Hepatitis C blood test.  Hepatitis B blood test.  Sexually transmitted disease (STD) testing.  Diabetes screening. This is done by checking your blood sugar (glucose) after you have not eaten for a while (fasting). You may have this done every 1-3 years.  Abdominal aortic aneurysm (AAA) screening. You may need this if you are a current or former smoker.  Osteoporosis. You may be screened starting at age 54 if you are at high risk. Talk with your health care provider about your test results, treatment options, and if necessary, the need for more tests. Vaccines  Your health care  provider may recommend certain vaccines, such as:  Influenza vaccine. This is recommended every year.  Tetanus, diphtheria, and acellular pertussis (Tdap, Td) vaccine. You may need a Td booster every 10 years.  Zoster vaccine. You may need this  after age 58.  Pneumococcal 13-valent conjugate (PCV13) vaccine. One dose is recommended after age 67.  Pneumococcal polysaccharide (PPSV23) vaccine. One dose is recommended after age 61. Talk to your health care provider about which screenings and vaccines you need and how often you need them. This information is not intended to replace advice given to you by your health care provider. Make sure you discuss any questions you have with your health care provider. Document Released: 08/16/2015 Document Revised: 04/08/2016 Document Reviewed: 05/21/2015 Elsevier Interactive Patient Education  2017 [place] Prevention in the Home Falls can cause injuries. They can happen to people of all ages. There are many things you can do to make your home safe and to help prevent falls. What can I do on the outside of my home?  Regularly fix the edges of walkways and driveways and fix any cracks.  Remove anything that might make you trip as you walk through a door, such as a raised step or threshold.  Trim any bushes or trees on the path to your home.  Use bright outdoor lighting.  Clear any walking paths of anything that might make someone trip, such as rocks or tools.  Regularly check to see if handrails are loose or broken. Make sure that both sides of any steps have handrails.  Any raised decks and porches should have guardrails on the edges.  Have any leaves, snow, or ice cleared regularly.  Use sand or salt on walking paths during winter.  Clean up any spills in your garage right away. This includes oil or grease spills. What can I do in the bathroom?  Use night lights.  Install grab bars by the toilet and in the tub and shower. Do not use towel bars as grab bars.  Use non-skid mats or decals in the tub or shower.  If you need to sit down in the shower, use a plastic, non-slip stool.  Keep the floor dry. Clean up any water that spills on the floor as soon as it happens.   Remove soap buildup in the tub or shower regularly.  Attach bath mats securely with double-sided non-slip rug tape.  Do not have throw rugs and other things on the floor that can make you trip. What can I do in the bedroom?  Use night lights.  Make sure that you have a light by your bed that is easy to reach.  Do not use any sheets or blankets that are too big for your bed. They should not hang down onto the floor.  Have a firm chair that has side arms. You can use this for support while you get dressed.  Do not have throw rugs and other things on the floor that can make you trip. What can I do in the kitchen?  Clean up any spills right away.  Avoid walking on wet floors.  Keep items that you use a lot in easy-to-reach places.  If you need to reach something above you, use a strong step stool that has a grab bar.  Keep electrical cords out of the way.  Do not use floor polish or wax that makes floors slippery. If you must use wax, use non-skid floor wax.  Do not have throw  rugs and other things on the floor that can make you trip. What can I do with my stairs?  Do not leave any items on the stairs.  Make sure that there are handrails on both sides of the stairs and use them. Fix handrails that are broken or loose. Make sure that handrails are as long as the stairways.  Check any carpeting to make sure that it is firmly attached to the stairs. Fix any carpet that is loose or worn.  Avoid having throw rugs at the top or bottom of the stairs. If you do have throw rugs, attach them to the floor with carpet tape.  Make sure that you have a light switch at the top of the stairs and the bottom of the stairs. If you do not have them, ask someone to add them for you. What else can I do to help prevent falls?  Wear shoes that:  Do not have high heels.  Have rubber bottoms.  Are comfortable and fit you well.  Are closed at the toe. Do not wear sandals.  If you use a  stepladder:  Make sure that it is fully opened. Do not climb a closed stepladder.  Make sure that both sides of the stepladder are locked into place.  Ask someone to hold it for you, if possible.  Clearly mark and make sure that you can see:  Any grab bars or handrails.  First and last steps.  Where the edge of each step is.  Use tools that help you move around (mobility aids) if they are needed. These include:  Canes.  Walkers.  Scooters.  Crutches.  Turn on the lights when you go into a dark area. Replace any light bulbs as soon as they burn out.  Set up your furniture so you have a clear path. Avoid moving your furniture around.  If any of your floors are uneven, fix them.  If there are any pets around you, be aware of where they are.  Review your medicines with your doctor. Some medicines can make you feel dizzy. This can increase your chance of falling. Ask your doctor what other things that you can do to help prevent falls. This information is not intended to replace advice given to you by your health care provider. Make sure you discuss any questions you have with your health care provider. Document Released: 05/16/2009 Document Revised: 12/26/2015 Document Reviewed: 08/24/2014 Elsevier Interactive Patient Education  2017 [employer].

## 2019-06-12 NOTE — Progress Notes (Signed)
PCP notes:  Health Maintenance: Will discuss flu and shingrix vaccines with provider before receiving them.    Abnormal Screenings: none   Patient concerns: none   Nurse concerns: none   Next PCP appt.: 06/19/2019 @ 2:20 pm

## 2019-06-13 LAB — REFLEX PSA, FREE
PSA, % Free: 25 % (calc) — ABNORMAL LOW (ref >25)
PSA, Free: 1.3 ng/mL

## 2019-06-13 LAB — PSA, TOTAL WITH REFLEX TO PSA, FREE: PSA, Total: 5.1 ng/mL — ABNORMAL HIGH (ref <=4.0)

## 2019-06-19 ENCOUNTER — Ambulatory Visit (INDEPENDENT_AMBULATORY_CARE_PROVIDER_SITE_OTHER): Payer: Medicare Other | Admitting: Family Medicine

## 2019-06-19 ENCOUNTER — Encounter: Payer: Self-pay | Admitting: Family Medicine

## 2019-06-19 ENCOUNTER — Other Ambulatory Visit: Payer: Self-pay

## 2019-06-19 VITALS — BP 158/62 | HR 67 | Temp 97.8°F | Ht 66.75 in | Wt 243.5 lb

## 2019-06-19 DIAGNOSIS — Z23 Encounter for immunization: Secondary | ICD-10-CM

## 2019-06-19 DIAGNOSIS — C61 Malignant neoplasm of prostate: Secondary | ICD-10-CM | POA: Diagnosis not present

## 2019-06-19 DIAGNOSIS — I10 Essential (primary) hypertension: Secondary | ICD-10-CM

## 2019-06-19 DIAGNOSIS — E661 Drug-induced obesity: Secondary | ICD-10-CM | POA: Diagnosis not present

## 2019-06-19 NOTE — Progress Notes (Signed)
Cesar Holstine T. [name], MD Primary Care and [place] at [facility] [place], [address] Phone: [phone]  FAX: [phone]  Cesar Powell - 85 y.o. male  MRN [id number]  Date of Birth: Nov 05, 1930  Visit Date: 06/19/2019  PCP: Owens Loffler, MD  Referred by: Owens Loffler, MD  Chief Complaint  Patient presents with  . Annual Exam    Part 2   Subjective:   Cesar Powell is a 85 y.o. very pleasant male patient who presents with the following:  Prostate cancer.  Went off of lupron.  He has made the choice to stop Lupron due to side effects.  Generally he did not like this it made him feel terrible and he gained weight.  Garlic and lemon juice.  Had option of going on chemo - oral pills.  This is from oncology, and he decided not to go this route.  S/p femur fracture. Trouble playing golf.  He has always walked on the golf course, but now he is having some difficulty doing this. Wants to lose weight.  He is having some challenges due to all of the gyms being closed for COVID-19.  HTN: no meds Mildly elevated, but at home it runs approximately systolic 1 [id number] 50. No CP, no sob. No HA.  BP Readings from Last 3 Encounters:  06/19/19 (!) 158/62  03/29/19 (!) 152/65  03/24/19 (!) [id number]    Basic Metabolic Panel:    Component Value Date/Time   NA 143 06/12/2019 0911   NA 137 03/08/2012 1641   K 4.7 06/12/2019 0911   K 5.8 (H) 03/08/2012 1641   CL 105 06/12/2019 0911   CL 103 03/08/2012 1641   CO2 30 06/12/2019 0911   CO2 25 03/08/2012 1641   BUN 15 06/12/2019 0911   BUN 13 03/08/2012 1641   CREATININE 1.10 06/12/2019 0911   CREATININE 0.84 03/08/2012 1641   GLUCOSE 120 (H) 06/12/2019 0911   GLUCOSE 97 03/08/2012 1641   CALCIUM 9.5 06/12/2019 0911   CALCIUM 9.0 03/08/2012 1641     Past Medical History, Surgical History, Social History, Family History, Problem List, Medications, and Allergies have  been reviewed and updated if relevant.  Patient Active Problem List   Diagnosis Date Noted  . Subtrochanteric fracture of femur ([place]) 10/08/2014  . Essential hypertension 09/06/2009  . METABOLIC SYNDROME X [id number]  . Obesity 06/22/2007  . DIVERTICULOSIS, COLON 06/22/2007  . Obstructive sleep apnea 06/22/2007  . Local recurrence of prostate cancer ([place]) 06/22/2007  . DIVERTICULITIS, HX OF 06/22/2007    Past Medical History:  Diagnosis Date  . Discoloration of nail   . H/O prostate cancer    was treated with radiation  . Hypertension   . Local recurrence of prostate cancer ([place]) 06/22/2007   Qualifier: Diagnosis of  By: Fuller Plan CMA (AAMA), [place]    . Shingles     Past Surgical History:  Procedure Laterality Date  . ABDOMINAL SURGERY     ruptured intestines   . CATARACT EXTRACTION W/PHACO Left 01/29/2016   Procedure: CATARACT EXTRACTION PHACO AND INTRAOCULAR LENS PLACEMENT ([place]) left eye;  Surgeon: Leandrew Koyanagi, MD;  Location: [place];  Service: Ophthalmology;  Laterality: Left;  RESTOR LENS  . CATARACT EXTRACTION W/PHACO Right 10/21/2016   Procedure: CATARACT EXTRACTION PHACO AND INTRAOCULAR LENS PLACEMENT ([facility])  Right restor toric lens;  Surgeon: Leandrew Koyanagi, MD;  Location: [place];  Service: Ophthalmology;  Laterality: Right;  restor toric lens  . CYSTOSCOPY WITH LITHOLAPAXY N/A 08/14/2016   Procedure: CYSTOSCOPY WITH LITHOLAPAXY;  Surgeon: Nickie Retort, MD;  Location: [facility];  Service: Urology;  Laterality: N/A;  . FEMUR IM NAIL Right 10/08/2014   Procedure: INTRAMEDULLARY (IM) NAIL FEMORAL;  Surgeon: Rod Can, MD;  Location: [place];  Service: Orthopedics;  Laterality: Right;  PER DANIELLE 110 MIN  . HOLMIUM LASER APPLICATION  [id number]   Procedure: HOLMIUM LASER APPLICATION;  Surgeon: Nickie Retort, MD;  Location: [facility];  Service: Urology;;  . OTHER SURGICAL HISTORY  2006   Prostate Radiation Surgery     Social  History   Socioeconomic History  . Marital status: Married    Spouse name: Not on file  . Number of children: Not on file  . Years of education: Not on file  . Highest education level: Not on file  Occupational History  . Occupation: retired  Scientific laboratory technician  . Financial resource strain: Not hard at all  . Food insecurity    Worry: Never true    Inability: Never true  . Transportation needs    Medical: No    Non-medical: No  Tobacco Use  . Smoking status: Former Smoker    Packs/day: 1.00    Years: 15.00    Pack years: 15.00    Types: Cigarettes    Quit date: 09/14/1965    Years since quitting: 53.7  . Smokeless tobacco: Never Used  Substance and Sexual Activity  . Alcohol use: Not Currently  . Drug use: No  . Sexual activity: Not on file  Lifestyle  . Physical activity    Days per week: 7 days    Minutes per session: 30 min  . Stress: Not at all  Relationships  . Social Herbalist on phone: Not on file    Gets together: Not on file    Attends religious service: Not on file    Active member of club or organization: Not on file    Attends meetings of clubs or organizations: Not on file    Relationship status: Not on file  . Intimate partner violence    Fear of current or ex partner: No    Emotionally abused: No    Physically abused: No    Forced sexual activity: No  Other Topics Concern  . Not on file  Social History Narrative  . Not on file    Family History  Problem Relation Age of Onset  . Heart disease Brother   . Heart attack Brother   . Prostate cancer Neg Hx   . Bladder Cancer Neg Hx   . Kidney cancer Neg Hx     No Known Allergies  Medication list reviewed and updated in full in [place].   GEN: No acute illnesses, no fevers, chills. GI: No n/v/d, eating normally Pulm: No SOB Interactive and getting along well at home.  Otherwise, ROS is as per the HPI.  Objective:   BP (!) 158/62   Pulse 67   Temp 97.8 F (36.6 C)  (Temporal)   Ht 5' 6.75" (1.695 m)   Wt 243 lb 8 oz (110.5 kg)   SpO2 98%   BMI 38.42 kg/m   GEN: WDWN, NAD, Non-toxic, A & O x 3 HEENT: Atraumatic, Normocephalic. Neck supple. No masses, No LAD. Ears and Nose: No external deformity. CV: RRR, No M/G/R. No JVD. No thrill. No extra heart sounds. PULM: CTA B, no wheezes, crackles,  rhonchi. No retractions. No resp. distress. No accessory muscle use. EXTR: No c/c/e NEURO Normal gait.  PSYCH: Normally interactive. Conversant. Not depressed or anxious appearing.  Calm demeanor.   Laboratory and Imaging Data: Results for orders placed or performed in visit on 06/12/19  PSA, Total with Reflex to PSA, Free  Result Value Ref Range   PSA, Total 5.1 (H) < OR = 4.0 ng/mL  CBC with Differential/Platelet  Result Value Ref Range   WBC 6.0 4.0 - 10.5 K/uL   RBC 3.87 (L) 4.22 - 5.81 Mil/uL   Hemoglobin 12.1 (L) 13.0 - 17.0 g/dL   HCT 36.8 (L) 39.0 - 52.0 %   MCV 95.0 78.0 - 100.0 fl   MCHC 33.0 30.0 - 36.0 g/dL   RDW 13.8 11.5 - 15.5 %   Platelets 204.0 150.0 - 400.0 K/uL   Neutrophils Relative % 67.8 43.0 - 77.0 %   Lymphocytes Relative 19.3 12.0 - 46.0 %   Monocytes Relative 8.6 3.0 - 12.0 %   Eosinophils Relative 3.5 0.0 - 5.0 %   Basophils Relative 0.8 0.0 - 3.0 %   Neutro Abs 4.1 1.4 - 7.7 K/uL   Lymphs Abs 1.2 0.7 - 4.0 K/uL   Monocytes Absolute 0.5 0.1 - 1.0 K/uL   Eosinophils Absolute 0.2 0.0 - 0.7 K/uL   Basophils Absolute 0.0 0.0 - 0.1 K/uL  Basic metabolic panel  Result Value Ref Range   Sodium 143 135 - 145 mEq/L   Potassium 4.7 3.5 - 5.1 mEq/L   Chloride 105 96 - 112 mEq/L   CO2 30 19 - 32 mEq/L   Glucose, Bld 120 (H) 70 - 99 mg/dL   BUN 15 6 - 23 mg/dL   Creatinine, Ser 1.10 0.40 - 1.50 mg/dL   GFR 63.35 >60.00 mL/min   Calcium 9.5 8.4 - 10.5 mg/dL  Liver Profile  Result Value Ref Range   Total Bilirubin 0.5 0.2 - 1.2 mg/dL   Bilirubin, Direct 0.1 0.0 - 0.3 mg/dL   Alkaline Phosphatase 53 39 - 117 U/L   AST 17 0 -  37 U/L   ALT 18 0 - 53 U/L   Total Protein 6.8 6.0 - 8.3 g/dL   Albumin 4.4 3.5 - 5.2 g/dL  Lipid Profile  Result Value Ref Range   Cholesterol 198 0 - 200 mg/dL   Triglycerides 117.0 0.0 - 149.0 mg/dL   HDL 51.40 >39.00 mg/dL   VLDL 23.4 0.0 - 40.0 mg/dL   LDL Cholesterol 123 (H) 0 - 99 mg/dL   Total CHOL/HDL Ratio 4    NonHDL 146.37   reflex PSA, Free  Result Value Ref Range   PSA, Free 1.3 ng/mL   PSA, % Free 25 (L) >25 % (calc)     Assessment and Plan:     ICD-10-CM   1. Local recurrence of prostate cancer ([place])  C61   2. Need for influenza vaccination  Z23 Flu Vaccine QUAD 6+ mos PF IM (Fluarix Quad PF)  3. 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 Essential hypertension  I10    >25 minutes spent in face to face time with patient, >50% spent in counselling or coordination of care  Blood pressure slightly elevated.  I am going to keep him off of medication and his age at 35.  Home BPs per report 1 40-1 50.  Local recurrence of prostate cancer.  With informed consent by his providers,  the patient decided to stop his Lupron.  I think that this is a reasonable choice for him, and he can direct his own medical care.  Ideally he will be able to achieve weight loss and he wants to resume playing golf and walk until the gyms are open. We reviewed all labs.  Follow-up: No follow-ups on file.  No orders of the defined types were placed in this encounter.  Orders Placed This Encounter  Procedures  . Flu Vaccine QUAD 6+ mos PF IM (Fluarix Quad PF)    Signed,  Zurich Carreno T. Marchetta Navratil, MD   Outpatient Encounter Medications as of 06/19/2019  Medication Sig  . Ascorbic Acid (VITAMIN C) 1000 MG tablet Take 1,000 mg by mouth daily.  . cholecalciferol (VITAMIN D) 1000 UNITS tablet Take 1,000 Units by mouth daily.  . Glucosamine-Chondroitin (GLUCOSAMINE CHONDR COMPLEX PO) Take 2 capsules by mouth daily.  . Multiple Vitamin (MULTIVITAMIN)  capsule Take 1 capsule by mouth daily.  . NON FORMULARY Take 2 tablets by mouth daily.  . tamsulosin (FLOMAX) 0.4 MG CAPS capsule Take 2 capsules (0.8 mg total) by mouth daily.  . [DISCONTINUED] Omega-3 Fatty Acids (FISH OIL) 1200 MG CAPS Take 1,200 mg by mouth daily.   No facility-administered encounter medications on file as of 06/19/2019.

## 2019-06-22 ENCOUNTER — Other Ambulatory Visit: Payer: Self-pay

## 2019-06-23 ENCOUNTER — Inpatient Hospital Stay: Payer: Medicare Other | Attending: Oncology

## 2019-06-23 ENCOUNTER — Other Ambulatory Visit: Payer: Self-pay

## 2019-06-23 DIAGNOSIS — C61 Malignant neoplasm of prostate: Secondary | ICD-10-CM | POA: Insufficient documentation

## 2019-06-23 LAB — PSA: Prostatic Specific Antigen: 5.26 ng/mL — ABNORMAL HIGH (ref 0.00–4.00)

## 2019-06-27 DIAGNOSIS — M5416 Radiculopathy, lumbar region: Secondary | ICD-10-CM | POA: Diagnosis not present

## 2019-06-27 DIAGNOSIS — M9905 Segmental and somatic dysfunction of pelvic region: Secondary | ICD-10-CM | POA: Diagnosis not present

## 2019-06-27 DIAGNOSIS — M9903 Segmental and somatic dysfunction of lumbar region: Secondary | ICD-10-CM | POA: Diagnosis not present

## 2019-06-27 DIAGNOSIS — M955 Acquired deformity of pelvis: Secondary | ICD-10-CM | POA: Diagnosis not present

## 2019-06-28 ENCOUNTER — Other Ambulatory Visit: Payer: Self-pay

## 2019-07-03 ENCOUNTER — Other Ambulatory Visit: Payer: Self-pay

## 2019-07-03 ENCOUNTER — Other Ambulatory Visit: Payer: Medicare Other

## 2019-07-03 DIAGNOSIS — C61 Malignant neoplasm of prostate: Secondary | ICD-10-CM

## 2019-07-04 LAB — PSA: Prostate Specific Ag, Serum: 5.7 ng/mL — ABNORMAL HIGH (ref 0.0–4.0)

## 2019-07-05 ENCOUNTER — Telehealth: Payer: Medicare Other | Admitting: Urology

## 2019-07-12 ENCOUNTER — Other Ambulatory Visit: Payer: Self-pay

## 2019-07-12 ENCOUNTER — Ambulatory Visit (INDEPENDENT_AMBULATORY_CARE_PROVIDER_SITE_OTHER): Payer: Medicare Other | Admitting: Urology

## 2019-07-12 ENCOUNTER — Encounter: Payer: Self-pay | Admitting: Urology

## 2019-07-12 VITALS — BP 159/76 | HR 66 | Ht 66.75 in | Wt 243.0 lb

## 2019-07-12 DIAGNOSIS — C61 Malignant neoplasm of prostate: Secondary | ICD-10-CM | POA: Diagnosis not present

## 2019-07-12 NOTE — Progress Notes (Signed)
   07/12/2019 1:38 PM   [name] November 22, 1930 [id number]  Reason for visit: Follow up   HPI: I saw 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  He is an 85 year old relatively healthy and active male who underwent radiation for prostate cancer in 2005.  His outside records are unavailable to me.  At some point he was started on testosterone by different provider and his PSA rose to as high as 18, his testosterone was stopped, and he was started on Lupron.  PSA dropped to as low as 0.4 on Lupron, however PSA continued to rise on Lupron as follows:  09/09/2017: 1.3  12/14/2017: 1.5 03/15/2018: 2.0 09/16/2018: 2.9 (received last lupron 6 month injection 09/22/2018) 12/21/2018: 3.15 03/24/2019: 4.01 06/12/2019: 5.1 07/03/2019: 5.7  His most recent imaging was CT of the chest abdomen and pelvis and bone scan in September 2019 which was negative for metastatic disease.  PSA at that time was 1.89.  At our last visit in August 2020 he had refused further Lupron injection as it made him feel very rundown and he felt "like was not worth living" if he had to continue on Lupron.  He also refused antiandrogens or any additional therapies with medical oncology at his last visit with Dr. Janese Banks on 03/24/2019.  He continues to feel well off of the Lupron with good energy and remains active.  He denies any bone pain or weight loss.  He denies any significant urinary symptoms.  We had a long conversation about biochemical recurrence, castrate resistance, and the differences between non-metastatic and metastatic prostate cancer with different treatment strategies.    I recommended further evaluation with likely Axumin PET scan prior to determining next treatment strategies.  My read is that he is very resistant to any additional therapies and would like to continue with observation, however I strongly  recommended he meet with oncology again to discuss alternative treatment strategies, as well as likely imaging results regarding any development of interval metastatic disease which could alter treatment options.  I messaged Dr. Janese Banks via epic to discuss imaging options for him, he has scheduled follow-up with her in February 2021.  Follow-up with me in 6 months for symptom check regarding urinary symptoms  A total of 25 minutes were spent face-to-face with the patient, greater than 50% was spent in patient education, counseling, and coordination of care regarding biochemical recurrence of prostate cancer, castrate resistance, and advanced prostate cancer treatment options.  Billey Co, [facility] [address] [place], [place] [address] [phone]

## 2019-07-14 ENCOUNTER — Other Ambulatory Visit: Payer: Self-pay | Admitting: *Deleted

## 2019-07-14 DIAGNOSIS — C61 Malignant neoplasm of prostate: Secondary | ICD-10-CM

## 2019-07-14 DIAGNOSIS — R9721 Rising PSA following treatment for malignant neoplasm of prostate: Secondary | ICD-10-CM

## 2019-07-17 ENCOUNTER — Telehealth: Payer: Self-pay | Admitting: *Deleted

## 2019-07-17 NOTE — Telephone Encounter (Signed)
Called Cesar Powell and asked him about whether he wanted to get pet scan that dr sninsky had suggested he should have. Cesar Powell. Is fine with . The [facility] . Nothing to eat or drink 6 hours prior to scan, he says he will just fast until he gets scan done.he can arrive 20 min. Early. Cesar Powell agreeable to this. We made an appt. For him to come in person rather than video. He does not feel comfortable to use the phone in that way. I explained to him how the my chart video would go and he wants to come in person. I have made him an appt 12/28 at 3:15 because MD is off week of christmas.  I have given him the option to push the scan out if he does not want to wait for a week to get results. He will see them on my chart. He is fine to take the scan 12/18 and to f/u 12/28 with Janese Banks

## 2019-07-31 ENCOUNTER — Inpatient Hospital Stay: Payer: Medicare Other | Admitting: Oncology

## 2019-08-01 ENCOUNTER — Other Ambulatory Visit: Payer: Self-pay

## 2019-08-01 ENCOUNTER — Encounter
Admission: RE | Admit: 2019-08-01 | Discharge: 2019-08-01 | Disposition: A | Discharge status: Home / Self Care | Payer: Medicare Other | Source: Ambulatory Visit | Attending: Oncology | Admitting: Oncology

## 2019-08-01 DIAGNOSIS — R9721 Rising PSA following treatment for malignant neoplasm of prostate: Secondary | ICD-10-CM

## 2019-08-01 DIAGNOSIS — C61 Malignant neoplasm of prostate: Secondary | ICD-10-CM | POA: Insufficient documentation

## 2019-08-01 MED ORDER — AXUMIN (FLUCICLOVINE F 18) INJECTION
9.8800 | Freq: Once | INTRAVENOUS | Status: AC | PRN
  Administered 2019-08-01: 9.88 via INTRAVENOUS

## 2019-08-07 ENCOUNTER — Inpatient Hospital Stay: Payer: Medicare Other

## 2019-08-07 ENCOUNTER — Encounter: Payer: Self-pay | Admitting: Oncology

## 2019-08-07 ENCOUNTER — Inpatient Hospital Stay: Payer: Medicare Other | Attending: Oncology | Admitting: Oncology

## 2019-08-07 ENCOUNTER — Other Ambulatory Visit: Payer: Self-pay

## 2019-08-07 ENCOUNTER — Other Ambulatory Visit: Payer: Self-pay | Admitting: *Deleted

## 2019-08-07 VITALS — BP 155/66 | HR 56 | Temp 96.3°F | Resp 18 | Wt 242.6 lb

## 2019-08-07 DIAGNOSIS — Z7189 Other specified counseling: Secondary | ICD-10-CM

## 2019-08-07 DIAGNOSIS — I10 Essential (primary) hypertension: Secondary | ICD-10-CM | POA: Diagnosis not present

## 2019-08-07 DIAGNOSIS — C61 Malignant neoplasm of prostate: Secondary | ICD-10-CM

## 2019-08-07 DIAGNOSIS — C779 Secondary and unspecified malignant neoplasm of lymph node, unspecified: Secondary | ICD-10-CM | POA: Insufficient documentation

## 2019-08-07 LAB — CBC WITH DIFFERENTIAL/PLATELET
Abs Immature Granulocytes: 0.02 10*3/uL (ref 0.00–0.07)
Basophils Absolute: 0.1 10*3/uL (ref 0.0–0.1)
Basophils Relative: 1 %
Eosinophils Absolute: 0.2 10*3/uL (ref 0.0–0.5)
Eosinophils Relative: 3 %
HCT: 38.2 % — ABNORMAL LOW (ref 39.0–52.0)
Hemoglobin: 12 g/dL — ABNORMAL LOW (ref 13.0–17.0)
Immature Granulocytes: 0 %
Lymphocytes Relative: 21 %
Lymphs Abs: 1.5 10*3/uL (ref 0.7–4.0)
MCH: 30.8 pg (ref 26.0–34.0)
MCHC: 31.4 g/dL (ref 30.0–36.0)
MCV: 98.2 fL (ref 80.0–100.0)
Monocytes Absolute: 0.6 10*3/uL (ref 0.1–1.0)
Monocytes Relative: 9 %
Neutro Abs: 4.6 10*3/uL (ref 1.7–7.7)
Neutrophils Relative %: 66 %
Platelets: 204 10*3/uL (ref 150–400)
RBC: 3.89 MIL/uL — ABNORMAL LOW (ref 4.22–5.81)
RDW: 13.4 % (ref 11.5–15.5)
WBC: 7 10*3/uL (ref 4.0–10.5)
nRBC: 0 % (ref 0.0–0.2)

## 2019-08-07 LAB — COMPREHENSIVE METABOLIC PANEL
ALT: 22 U/L (ref 0–44)
AST: 20 U/L (ref 15–41)
Albumin: 4.5 g/dL (ref 3.5–5.0)
Alkaline Phosphatase: 46 U/L (ref 38–126)
Anion gap: 9 (ref 5–15)
BUN: 16 mg/dL (ref 8–23)
CO2: 29 mmol/L (ref 22–32)
Calcium: 9.6 mg/dL (ref 8.9–10.3)
Chloride: 106 mmol/L (ref 98–111)
Creatinine, Ser: 0.95 mg/dL (ref 0.61–1.24)
GFR calc Af Amer: >60 mL/min (ref >60)
GFR calc non Af Amer: >60 mL/min (ref >60)
Glucose, Bld: 107 mg/dL — ABNORMAL HIGH (ref 70–99)
Potassium: 4.7 mmol/L (ref 3.5–5.1)
Sodium: 144 mmol/L (ref 135–145)
Total Bilirubin: 0.6 mg/dL (ref 0.3–1.2)
Total Protein: 7.3 g/dL (ref 6.5–8.1)

## 2019-08-07 LAB — PSA: Prostatic Specific Antigen: 6.7 ng/mL — ABNORMAL HIGH (ref 0.00–4.00)

## 2019-08-08 DIAGNOSIS — Z7189 Other specified counseling: Secondary | ICD-10-CM | POA: Insufficient documentation

## 2019-08-08 DIAGNOSIS — C61 Malignant neoplasm of prostate: Secondary | ICD-10-CM | POA: Insufficient documentation

## 2019-08-08 NOTE — Progress Notes (Signed)
Hematology/Oncology Consult note [facility]  Telephone:([phone] [phone]  Patient Care Team: Owens Loffler, MD as PCP - General Leandrew Koyanagi, MD as Referring Physician (Ophthalmology) Nickie Retort, MD as Consulting Physician (Urology)   Name of the patient: Cesar Powell  [id number]  14-Oct-1930   Date of visit: 08/08/19  Diagnosis- castrate resistant nonmetastatic prostate cancer  Chief complaint/ Reason for visit-routine follow-up of prostate cancer to discuss the results of auximin PET CT scan  Heme/Onc history: patient is a 86 year old gentleman with no significant past medical problems. He has a history of prostate cancer that was diagnosed and treated with radiation in 2005. At that point he his PSA was about 0.03 for a long time. Then started on Axiron for low testosterone. PSA eventually went up to as high as 18 2015. At that point he was started on Lupron his PSAdoubling time is around 10 months.Most recently since October 2018 after his PSA went down from 3.6-1.7 and has been gradually increasing to 1.3,1.5and 2 indicating a PSAdoublingtime of 9.9 months.Testosterone levels continue to be suppressed.Patient is tolerating his Lupron well except for some mild fatigue and hot flashes. He is fit and continues to exercise.he has not started oral anti androgen therapy yet.  patient was last seen by me in September 2019 discussed adding oral antiandrogens like apalutamide versus enzalutamide to Lupron in castrate resistant nonmetastatic prostate cancer. After discussing risks and benefits patient did not wish to proceed.Patient stopped lupron in April 2020  Interval history-patient has been off Lupron since April 2020.  He reports feeling significantly better after coming off ADT.  He feels he has more energy and is able to exercise.  Denies any other complaints or pain at this time  ECOG PS- 1 Pain scale-  0   Review of systems- Review of Systems  Constitutional: Positive for malaise/fatigue. Negative for chills, fever and weight loss.  HENT: Negative for congestion, ear discharge and nosebleeds.   Eyes: Negative for blurred vision.  Respiratory: Negative for cough, hemoptysis, sputum production, shortness of breath and wheezing.   Cardiovascular: Negative for chest pain, palpitations, orthopnea and claudication.  Gastrointestinal: Negative for abdominal pain, blood in stool, constipation, diarrhea, heartburn, melena, nausea and vomiting.  Genitourinary: Negative for dysuria, flank pain, frequency, hematuria and urgency.  Musculoskeletal: Negative for back pain, joint pain and myalgias.  Skin: Negative for rash.  Neurological: Negative for dizziness, tingling, focal weakness, seizures, weakness and headaches.  Endo/Heme/Allergies: Does not bruise/bleed easily.  Psychiatric/Behavioral: Negative for depression and suicidal ideas. The patient does not have insomnia.      No Known Allergies   Past Medical History:  Diagnosis Date  . Discoloration of nail   . H/O prostate cancer    was treated with radiation  . Hypertension   . Local recurrence of prostate cancer ([place]) 06/22/2007   Qualifier: Diagnosis of  By: Fuller Plan CMA (AAMA), [place]    . Shingles      Past Surgical History:  Procedure Laterality Date  . ABDOMINAL SURGERY     ruptured intestines   . CATARACT EXTRACTION W/PHACO Left 01/29/2016   Procedure: CATARACT EXTRACTION PHACO AND INTRAOCULAR LENS PLACEMENT ([place]) left eye;  Surgeon: Leandrew Koyanagi, MD;  Location: [place];  Service: Ophthalmology;  Laterality: Left;  RESTOR LENS  . CATARACT EXTRACTION W/PHACO Right 10/21/2016   Procedure: CATARACT EXTRACTION PHACO AND INTRAOCULAR LENS PLACEMENT ([facility])  Right restor toric lens;  Surgeon: Leandrew Koyanagi, MD;  [facility];  Service: Ophthalmology;  Laterality: Right;  restor toric lens  .  CYSTOSCOPY WITH LITHOLAPAXY N/A 08/14/2016   Procedure: CYSTOSCOPY WITH LITHOLAPAXY;  Surgeon: Nickie Retort, MD;  Location: [facility];  Service: Urology;  Laterality: N/A;  . FEMUR IM NAIL Right 10/08/2014   Procedure: INTRAMEDULLARY (IM) NAIL FEMORAL;  Surgeon: Rod Can, MD;  Location: [place];  Service: Orthopedics;  Laterality: Right;  PER DANIELLE 110 MIN  . HOLMIUM LASER APPLICATION  [id number]   Procedure: HOLMIUM LASER APPLICATION;  Surgeon: Nickie Retort, MD;  Location: [facility];  Service: Urology;;  . OTHER SURGICAL HISTORY  2006   Prostate Radiation Surgery     Social History   Socioeconomic History  . Marital status: Married    Spouse name: Not on file  . Number of children: Not on file  . Years of education: Not on file  . Highest education level: Not on file  Occupational History  . Occupation: retired  Tobacco Use  . Smoking status: Former Smoker    Packs/day: 1.00    Years: 15.00    Pack years: 15.00    Types: Cigarettes    Quit date: 09/14/1965    Years since quitting: 53.9  . Smokeless tobacco: Never Used  Substance and Sexual Activity  . Alcohol use: Not Currently  . Drug use: No  . Sexual activity: Not on file  Other Topics Concern  . Not on file  Social History Narrative  . Not on file   Social Determinants of Health   Financial Resource Strain: Low Risk   . Difficulty of Paying Living Expenses: Not hard at all  Food Insecurity: No Food Insecurity  . Worried About Charity fundraiser in the Last Year: Never true  . Ran Out of Food in the Last Year: Never true  Transportation Needs: No Transportation Needs  . Lack of Transportation (Medical): No  . Lack of Transportation (Non-Medical): No  Physical Activity: Sufficiently Active  . Days of Exercise per Week: 7 days  . Minutes of Exercise per Session: 30 min  Stress: No Stress Concern Present  . Feeling of Stress : Not at all  Social Connections:   . Frequency of Communication with  Friends and Family: Not on file  . Frequency of Social Gatherings with Friends and Family: Not on file  . Attends Religious Services: Not on file  . Active Member of Clubs or Organizations: Not on file  . Attends Archivist Meetings: Not on file  . Marital Status: Not on file  Intimate Partner Violence: Not At Risk  . Fear of Current or Ex-Partner: No  . Emotionally Abused: No  . Physically Abused: No  . Sexually Abused: No    Family History  Problem Relation Age of Onset  . Heart disease Brother   . Heart attack Brother   . Lung cancer Brother   . Prostate cancer Neg Hx   . Bladder Cancer Neg Hx   . Kidney cancer Neg Hx      Current Outpatient Medications:  .  Ascorbic Acid (VITAMIN C) 1000 MG tablet, Take 1,000 mg by mouth daily., Disp: , Rfl:  .  cholecalciferol (VITAMIN D) 1000 UNITS tablet, Take 1,000 Units by mouth daily., Disp: , Rfl:  .  Glucosamine-Chondroitin (GLUCOSAMINE CHONDR COMPLEX PO), Take 2 capsules by mouth daily., Disp: , Rfl:  .  Multiple Vitamin (MULTIVITAMIN) capsule, Take 1 capsule by mouth daily., Disp: , Rfl:  .  NON  FORMULARY, Take 2 tablets by mouth daily., Disp: , Rfl:  .  tamsulosin (FLOMAX) 0.4 MG CAPS capsule, Take 2 capsules (0.8 mg total) by mouth daily., Disp: 60 capsule, Rfl: 11  Physical exam:  Vitals:   08/07/19 1328 08/07/19 1329  BP:  (!) 155/66  Pulse:  (!) 56  Resp:  18  Temp:  (!) 96.3 F (35.7 C)  TempSrc:  Tympanic  Weight: 242 lb 9.6 oz (110 kg) 242 lb 9.6 oz (110 kg)   Physical Exam HENT:     Head: Normocephalic and atraumatic.  Eyes:     Pupils: Pupils are equal, round, and reactive to light.  Cardiovascular:     Rate and Rhythm: Normal rate and regular rhythm.     Heart sounds: Normal heart sounds.  Pulmonary:     Effort: Pulmonary effort is normal.     Breath sounds: Normal breath sounds.  Abdominal:     General: Bowel sounds are normal.     Palpations: Abdomen is soft.  Musculoskeletal:     Cervical  back: Normal range of motion.  Skin:    General: Skin is warm and dry.  Neurological:     Mental Status: He is alert and oriented to person, place, and time.      CMP Latest Ref Rng & Units 08/07/2019  Glucose 70 - 99 mg/dL 107(H)  BUN 8 - 23 mg/dL 16  Creatinine 0.61 - 1.24 mg/dL 0.95  Sodium 135 - 145 mmol/L 144  Potassium 3.5 - 5.1 mmol/L 4.7  Chloride 98 - 111 mmol/L 106  CO2 22 - 32 mmol/L 29  Calcium 8.9 - 10.3 mg/dL 9.6  Total Protein 6.5 - 8.1 g/dL 7.3  Total Bilirubin 0.3 - 1.2 mg/dL 0.6  Alkaline Phos 38 - 126 U/L 46  AST 15 - 41 U/L 20  ALT 0 - 44 U/L 22   CBC Latest Ref Rng & Units 08/07/2019  WBC 4.0 - 10.5 K/uL 7.0  Hemoglobin 13.0 - 17.0 g/dL 12.0(L)  Hematocrit 39.0 - 52.0 % 38.2(L)  Platelets 150 - 400 K/uL 204    No images are attached to the encounter.  NM PET (AXUMIN) SKULL BASE TO MID THIGH  Result Date: 08/02/2019 CLINICAL DATA:  Prostate carcinoma with biochemical recurrence. EXAM: NUCLEAR MEDICINE PET SKULL BASE TO THIGH TECHNIQUE: 9.88 mCi F-18 Fluciclovine was injected intravenously. Full-ring PET imaging was performed from the skull base to thigh after the radiotracer. CT data was obtained and used for attenuation correction and anatomic localization. COMPARISON:  CT chest, abdomen, and pelvis 04/06/2018 and nuclear medicine bone scan 04/06/2018. FINDINGS: NECK No radiotracer activity in neck lymph nodes. Incidental CT finding: Mucosal thickening noted within the left maxillary sinus. CHEST No radiotracer accumulation within mediastinal or hilar lymph nodes. No suspicious pulmonary nodules on the CT scan. Incidental CT finding: Aortic atherosclerosis. Lad, left circumflex and RCA coronary artery calcifications. ABDOMEN/PELVIS Prostate: SUV max within the prostate gland is equal to 3.65. Lymph nodes: Left retroperitoneal lymph node is tracer avid measuring 0.9 cm with SUV max of 6.6. Right pre caval lymph node just above the aortic bifurcation measures 0.7 cm  and has an SUV max of 4.6. 1.4 cm right common iliac lymph node measures 10.6 cm. 0.8 cm left common iliac lymph node has an SUV max of 5.9. Just below the bifurcation of the common IVC there is a 0.7 cm tracer avid lymph node within SUV max of 6.47. 0.8 cm right external iliac lymph node has  an SUV max of 5.33. Liver: No evidence of liver metastasis Incidental CT finding: Aortic atherosclerosis. Calcified mesenteric lymph nodes identified. Hepatic steatosis. 3.3 cm left lower pole kidney cyst is incompletely characterized without IV contrast. SKELETON No focal  activity to suggest skeletal metastasis. IMPRESSION: 1. Tracer avid distal retroperitoneal, bilateral common iliac, and right external iliac lymph nodes concerning for nodal metastasis. 2. Incidental findings including aortic atherosclerosis, mild left maxillary sinus inflammation, and hepatic steatosis. 3.  Aortic Atherosclerosis (ICD10-I70.0). Electronically Signed   By: Kerby Moors M.D.   On: 08/02/2019 09:09     Assessment and plan- Patient is a 86 y.o. male with castrate resistant metastatic prostate cancer to the lymph nodes here to discuss results of auximin PET CT scan and further management  I have personally reviewed PET/CT scan images independently and discussed findings with the patient.  This PET scan was compared to CT scan that he had in September 2019.PET scan shows new areas of retroperitoneal common iliac as well as external iliac lymphadenopathy concerning for nodal metastases.  No evidence of bone metastases.  This does constitute metastatic disease and treatment at this time will be palliative and not curative.  Patient came off ADT in April 2020 and he has had a consistent rise in his PSA which is up to 6.7 today.  PSA doubling time remains around 10 months.  Patient is really not interested in going back to Lupron or additional ADT at this time.  I discussed this case with Dr. Donella Stade as well as Dr. Diamantina Providence.  I will refer  the patient to radiation oncology to see if there would be any role for palliative radiation to his lymph nodes.  Patient was also interested in [place].  Discussed with the patient that Pro[place] does have a role in castrate resistant metastatic prostate cancer and has been shown to improve overall survival by 2 to 3 months but did not have any significant improvement in progression free survival or PSA measurements.  I will start off with radiation oncology referral at this time.  I will see him back in 3 months time with CBC with differential, CMP and PSA.    Visit Diagnosis 1. Prostate cancer ([place])   2. Goals of care, counseling/discussion      Dr. Randa Evens, MD, MPH [facility] at [facility] [id number] 08/08/2019 8:53 AM

## 2019-08-16 ENCOUNTER — Encounter: Payer: Self-pay | Admitting: Radiation Oncology

## 2019-08-16 ENCOUNTER — Other Ambulatory Visit: Payer: Self-pay

## 2019-08-16 ENCOUNTER — Ambulatory Visit
Admission: RE | Admit: 2019-08-16 | Discharge: 2019-08-16 | Disposition: A | Discharge status: Home / Self Care | Payer: Medicare Other | Source: Ambulatory Visit | Attending: Radiation Oncology | Admitting: Radiation Oncology

## 2019-08-16 VITALS — BP 189/74 | HR 67 | Temp 96.4°F | Wt 242.5 lb

## 2019-08-16 DIAGNOSIS — Z79899 Other long term (current) drug therapy: Secondary | ICD-10-CM | POA: Diagnosis not present

## 2019-08-16 DIAGNOSIS — I10 Essential (primary) hypertension: Secondary | ICD-10-CM | POA: Insufficient documentation

## 2019-08-16 DIAGNOSIS — Z801 Family history of malignant neoplasm of trachea, bronchus and lung: Secondary | ICD-10-CM | POA: Diagnosis not present

## 2019-08-16 DIAGNOSIS — C61 Malignant neoplasm of prostate: Secondary | ICD-10-CM

## 2019-08-16 DIAGNOSIS — Z923 Personal history of irradiation: Secondary | ICD-10-CM | POA: Insufficient documentation

## 2019-08-16 NOTE — Consult Note (Signed)
NEW PATIENT EVALUATION  Name: Cesar Powell  MRN: [id number]  Date:   08/16/2019     DOB: [birth year]   This 86 y.o. male patient presents to the clinic for initial evaluation of castrate resistant prostate cancer with progressive PSA in patient status post external beam radiation therapy in 2005 at [facility].  REFERRING PHYSICIAN: Owens Loffler, MD  CHIEF COMPLAINT:  Chief Complaint  Patient presents with  . Prostate Cancer    Initial Eval    DIAGNOSIS: The encounter diagnosis was Prostate cancer ([place]).   PREVIOUS INVESTIGATIONS:  Clinical notes reviewed Pathology reports requested from [facility] Clinical notes and treatment from [facility] requested PET CT scan reviewed  HPI: Patient is a 86 year old male with status post external beam radiation therapy at [facility] back in 2005.  Those clinical records have been requested for review.  Patient did well initially in 2019 started presenting with elevated PSA of 1.3 which is progressed to 5.7 in November 2020.  He underwent Axumin PET CT scan showing involvement of distal retroperitoneal bilateral common iliac and right external iliac lymph nodes concerning for nodal metastasis.  He also had SUV of 3.6 within the prostate gland.  At some point in his care a physician had put him back on testosterone after years of suppression with Lupron although this has been discontinued.  Patient is been seen by medical oncology with further androgen deprivation therapy recommended although patient has declined.  He is now referred to radiation collagen for consideration of treatment.  He is doing fairly well specifically denies any increased lower urinary tract symptoms diarrhea fatigue.  He is having no bone pain and PET CT scan did not demonstrate any evidence of metastatic involvement of his bone.  PLANNED TREATMENT REGIMEN: IMRT radiation therapy to pelvis and distal periaortic lymph nodes  PAST MEDICAL HISTORY:  has a past medical history of Discoloration of nail,  H/O prostate cancer, Hypertension, Local recurrence of prostate cancer ([place]) (06/22/2007), and Shingles.    PAST SURGICAL HISTORY:  Past Surgical History:  Procedure Laterality Date  . ABDOMINAL SURGERY     ruptured intestines   . CATARACT EXTRACTION W/PHACO Left 01/29/2016   Procedure: CATARACT EXTRACTION PHACO AND INTRAOCULAR LENS PLACEMENT ([place]) left eye;  Surgeon: Leandrew Koyanagi, MD;  Location: [place];  Service: Ophthalmology;  Laterality: Left;  RESTOR LENS  . CATARACT EXTRACTION W/PHACO Right 10/21/2016   Procedure: CATARACT EXTRACTION PHACO AND INTRAOCULAR LENS PLACEMENT ([facility])  Right restor toric lens;  Surgeon: Leandrew Koyanagi, MD;  Location: [place];  Service: Ophthalmology;  Laterality: Right;  restor toric lens  . CYSTOSCOPY WITH LITHOLAPAXY N/A 08/14/2016   Procedure: CYSTOSCOPY WITH LITHOLAPAXY;  Surgeon: Nickie Retort, MD;  Location: [facility];  Service: Urology;  Laterality: N/A;  . FEMUR IM NAIL Right 10/08/2014   Procedure: INTRAMEDULLARY (IM) NAIL FEMORAL;  Surgeon: Rod Can, MD;  Location: [place];  Service: Orthopedics;  Laterality: Right;  PER DANIELLE 110 MIN  . HOLMIUM LASER APPLICATION  [id number]   Procedure: HOLMIUM LASER APPLICATION;  Surgeon: Nickie Retort, MD;  Location: [facility];  Service: Urology;;  . OTHER SURGICAL HISTORY  2006   Prostate Radiation Surgery     FAMILY HISTORY: family history includes Heart attack in his brother; Heart disease in his brother; Lung cancer in his brother.  SOCIAL HISTORY:  reports that he quit smoking about 53 years ago. His smoking use included cigarettes. He has a 15.00 pack-year smoking history. He has never used smokeless  tobacco. He reports previous alcohol use. He reports that he does not use drugs.  ALLERGIES: Patient has no known allergies.  MEDICATIONS:  Current Outpatient Medications  Medication Sig Dispense Refill  . Ascorbic Acid (VITAMIN C) 1000 MG tablet Take 1,000 mg by  mouth daily.    . cholecalciferol (VITAMIN D) 1000 UNITS tablet Take 1,000 Units by mouth daily.    . Glucosamine-Chondroitin (GLUCOSAMINE CHONDR COMPLEX PO) Take 2 capsules by mouth daily.    . Multiple Vitamin (MULTIVITAMIN) capsule Take 1 capsule by mouth daily.    . NON FORMULARY Take 2 tablets by mouth daily.    . tamsulosin (FLOMAX) 0.4 MG CAPS capsule Take 2 capsules (0.8 mg total) by mouth daily. 60 capsule 11   No current facility-administered medications for this encounter.    ECOG PERFORMANCE STATUS:  0 - Asymptomatic  REVIEW OF SYSTEMS: Patient denies any weight loss, fatigue, weakness, fever, chills or night sweats. Patient denies any loss of vision, blurred vision. Patient denies any ringing  of the ears or hearing loss. No irregular heartbeat. Patient denies heart murmur or history of fainting. Patient denies any chest pain or pain radiating to her upper extremities. Patient denies any shortness of breath, difficulty breathing at night, cough or hemoptysis. Patient denies any swelling in the lower legs. Patient denies any nausea vomiting, vomiting of blood, or coffee ground material in the vomitus. Patient denies any stomach pain. Patient states has had normal bowel movements no significant constipation or diarrhea. Patient denies any dysuria, hematuria or significant nocturia. Patient denies any problems walking, swelling in the joints or loss of balance. Patient denies any skin changes, loss of hair or loss of weight. Patient denies any excessive worrying or anxiety or significant depression. Patient denies any problems with insomnia. Patient denies excessive thirst, polyuria, polydipsia. Patient denies any swollen glands, patient denies easy bruising or easy bleeding. Patient denies any recent infections, allergies or URI. Patient "s visual fields have not changed significantly in recent time.   PHYSICAL EXAM: BP (!) 189/74   Pulse 67   Temp (!) 96.4 F (35.8 C)   Wt 242 lb 8 oz  (110 kg)   BMI 38.27 kg/m  Well-developed well-nourished patient in NAD. HEENT reveals PERLA, EOMI, discs not visualized.  Oral cavity is clear. No oral mucosal lesions are identified. Neck is clear without evidence of cervical or supraclavicular adenopathy. Lungs are clear to A&P. Cardiac examination is essentially unremarkable with regular rate and rhythm without murmur rub or thrill. Abdomen is benign with no organomegaly or masses noted. Motor sensory and DTR levels are equal and symmetric in the upper and lower extremities. Cranial nerves II through XII are grossly intact. Proprioception is intact. No peripheral adenopathy or edema is identified. No motor or sensory levels are noted. Crude visual fields are within normal range.  LABORATORY DATA: Pathology report from [facility] requested for review    RADIOLOGY RESULTS: PET CT scan reviewed compatible with above-stated findings   IMPRESSION: Recurrent prostate cancer status post external beam treatment back in 2005 at [facility] now castrate resistant with evidence of pelvic and periaortic lymph node progression with no evidence of bone metastasis.  PLAN: This time I requested his previous treatment fields as well as doses from [facility].  I believe I can offer some palliative radiation therapy to his pelvic lymph nodes and treat all those areas to 4500 centigrade.  May be able to add additional radiation therapy to his prostate based on his previous dose  in 2005.  I would also use PET CT fusion study for treatment planning and boost those positive nodes to 60 Gy.  Risks and benefits of treatment including increased lower urinary tract symptoms diarrhea fatigue alteration of blood counts all were described in detail to the patient.  He seems to comprehend my treatment plan well.  I have personally set up and ordered CT simulation in about 1 to 2 weeks in meantime we will try to obtain previous records from [facility].  I would like to take this opportunity to thank you for  allowing me to participate in the care of your patient.Noreene Filbert, MD

## 2019-08-23 ENCOUNTER — Other Ambulatory Visit: Payer: Self-pay

## 2019-08-24 ENCOUNTER — Other Ambulatory Visit: Payer: Self-pay

## 2019-08-24 ENCOUNTER — Ambulatory Visit
Admission: RE | Admit: 2019-08-24 | Discharge: 2019-08-24 | Disposition: A | Discharge status: Home / Self Care | Payer: Medicare Other | Source: Ambulatory Visit | Attending: Radiation Oncology | Admitting: Radiation Oncology

## 2019-08-24 DIAGNOSIS — C61 Malignant neoplasm of prostate: Secondary | ICD-10-CM | POA: Diagnosis not present

## 2019-08-25 ENCOUNTER — Other Ambulatory Visit: Payer: Self-pay | Admitting: *Deleted

## 2019-08-25 DIAGNOSIS — C61 Malignant neoplasm of prostate: Secondary | ICD-10-CM

## 2019-08-30 ENCOUNTER — Ambulatory Visit: Payer: Medicare Other

## 2019-08-31 ENCOUNTER — Ambulatory Visit: Payer: Medicare Other

## 2019-09-01 ENCOUNTER — Ambulatory Visit: Payer: Medicare Other

## 2019-09-01 DIAGNOSIS — C61 Malignant neoplasm of prostate: Secondary | ICD-10-CM | POA: Diagnosis not present

## 2019-09-04 ENCOUNTER — Other Ambulatory Visit: Payer: Self-pay

## 2019-09-04 ENCOUNTER — Ambulatory Visit: Payer: Medicare Other

## 2019-09-04 ENCOUNTER — Ambulatory Visit
Admission: RE | Admit: 2019-09-04 | Discharge: 2019-09-04 | Disposition: A | Discharge status: Home / Self Care | Payer: Medicare Other | Source: Ambulatory Visit | Attending: Radiation Oncology | Admitting: Radiation Oncology

## 2019-09-04 DIAGNOSIS — C61 Malignant neoplasm of prostate: Secondary | ICD-10-CM | POA: Insufficient documentation

## 2019-09-05 ENCOUNTER — Other Ambulatory Visit: Payer: Self-pay

## 2019-09-05 ENCOUNTER — Ambulatory Visit
Admission: RE | Admit: 2019-09-05 | Discharge: 2019-09-05 | Disposition: A | Discharge status: Home / Self Care | Payer: Medicare Other | Source: Ambulatory Visit | Attending: Radiation Oncology | Admitting: Radiation Oncology

## 2019-09-05 DIAGNOSIS — C61 Malignant neoplasm of prostate: Secondary | ICD-10-CM | POA: Diagnosis not present

## 2019-09-06 ENCOUNTER — Ambulatory Visit
Admission: RE | Admit: 2019-09-06 | Discharge: 2019-09-06 | Disposition: A | Discharge status: Home / Self Care | Payer: Medicare Other | Source: Ambulatory Visit | Attending: Radiation Oncology | Admitting: Radiation Oncology

## 2019-09-06 ENCOUNTER — Other Ambulatory Visit: Payer: Self-pay

## 2019-09-06 ENCOUNTER — Other Ambulatory Visit: Payer: Self-pay | Admitting: *Deleted

## 2019-09-06 DIAGNOSIS — C61 Malignant neoplasm of prostate: Secondary | ICD-10-CM

## 2019-09-07 ENCOUNTER — Other Ambulatory Visit: Payer: Self-pay

## 2019-09-07 ENCOUNTER — Ambulatory Visit
Admission: RE | Admit: 2019-09-07 | Discharge: 2019-09-07 | Disposition: A | Discharge status: Home / Self Care | Payer: Medicare Other | Source: Ambulatory Visit | Attending: Radiation Oncology | Admitting: Radiation Oncology

## 2019-09-07 DIAGNOSIS — C61 Malignant neoplasm of prostate: Secondary | ICD-10-CM | POA: Diagnosis not present

## 2019-09-08 ENCOUNTER — Ambulatory Visit
Admission: RE | Admit: 2019-09-08 | Discharge: 2019-09-08 | Disposition: A | Discharge status: Home / Self Care | Payer: Medicare Other | Source: Ambulatory Visit | Attending: Radiation Oncology | Admitting: Radiation Oncology

## 2019-09-08 ENCOUNTER — Other Ambulatory Visit: Payer: Self-pay

## 2019-09-08 DIAGNOSIS — C61 Malignant neoplasm of prostate: Secondary | ICD-10-CM | POA: Diagnosis not present

## 2019-09-11 ENCOUNTER — Other Ambulatory Visit: Payer: Self-pay

## 2019-09-11 ENCOUNTER — Inpatient Hospital Stay: Payer: Medicare Other | Attending: Oncology

## 2019-09-11 ENCOUNTER — Ambulatory Visit
Admission: RE | Admit: 2019-09-11 | Discharge: 2019-09-11 | Disposition: A | Discharge status: Home / Self Care | Payer: Medicare Other | Source: Ambulatory Visit | Attending: Radiation Oncology | Admitting: Radiation Oncology

## 2019-09-11 DIAGNOSIS — Z5111 Encounter for antineoplastic chemotherapy: Secondary | ICD-10-CM | POA: Diagnosis not present

## 2019-09-11 DIAGNOSIS — C61 Malignant neoplasm of prostate: Secondary | ICD-10-CM | POA: Insufficient documentation

## 2019-09-11 MED ORDER — LEUPROLIDE ACETATE (6 MONTH) 45 MG ~~LOC~~ KIT
45.0000 mg | PACK | Freq: Once | SUBCUTANEOUS | Status: AC
  Administered 2019-09-11: 09:00:00 45 mg via SUBCUTANEOUS
  Filled 2019-09-11: qty 45

## 2019-09-12 ENCOUNTER — Other Ambulatory Visit: Payer: Self-pay

## 2019-09-12 ENCOUNTER — Ambulatory Visit
Admission: RE | Admit: 2019-09-12 | Discharge: 2019-09-12 | Disposition: A | Discharge status: Home / Self Care | Payer: Medicare Other | Source: Ambulatory Visit | Attending: Radiation Oncology | Admitting: Radiation Oncology

## 2019-09-12 DIAGNOSIS — C61 Malignant neoplasm of prostate: Secondary | ICD-10-CM | POA: Diagnosis not present

## 2019-09-13 ENCOUNTER — Other Ambulatory Visit: Payer: Self-pay

## 2019-09-13 ENCOUNTER — Ambulatory Visit
Admission: RE | Admit: 2019-09-13 | Discharge: 2019-09-13 | Disposition: A | Discharge status: Home / Self Care | Payer: Medicare Other | Source: Ambulatory Visit | Attending: Radiation Oncology | Admitting: Radiation Oncology

## 2019-09-13 DIAGNOSIS — C61 Malignant neoplasm of prostate: Secondary | ICD-10-CM | POA: Diagnosis not present

## 2019-09-14 ENCOUNTER — Ambulatory Visit
Admission: RE | Admit: 2019-09-14 | Discharge: 2019-09-14 | Disposition: A | Discharge status: Home / Self Care | Payer: Medicare Other | Source: Ambulatory Visit | Attending: Radiation Oncology | Admitting: Radiation Oncology

## 2019-09-14 ENCOUNTER — Inpatient Hospital Stay: Payer: Medicare Other

## 2019-09-14 ENCOUNTER — Other Ambulatory Visit: Payer: Self-pay

## 2019-09-14 DIAGNOSIS — C61 Malignant neoplasm of prostate: Secondary | ICD-10-CM | POA: Diagnosis not present

## 2019-09-14 DIAGNOSIS — Z5111 Encounter for antineoplastic chemotherapy: Secondary | ICD-10-CM | POA: Diagnosis not present

## 2019-09-14 LAB — CBC
HCT: 36.4 % — ABNORMAL LOW (ref 39.0–52.0)
Hemoglobin: 11.4 g/dL — ABNORMAL LOW (ref 13.0–17.0)
MCH: 30.6 pg (ref 26.0–34.0)
MCHC: 31.3 g/dL (ref 30.0–36.0)
MCV: 97.8 fL (ref 80.0–100.0)
Platelets: 154 10*3/uL (ref 150–400)
RBC: 3.72 MIL/uL — ABNORMAL LOW (ref 4.22–5.81)
RDW: 13.2 % (ref 11.5–15.5)
WBC: 4.6 10*3/uL (ref 4.0–10.5)
nRBC: 0 % (ref 0.0–0.2)

## 2019-09-15 ENCOUNTER — Other Ambulatory Visit: Payer: Self-pay

## 2019-09-15 ENCOUNTER — Ambulatory Visit
Admission: RE | Admit: 2019-09-15 | Discharge: 2019-09-15 | Disposition: A | Discharge status: Home / Self Care | Payer: Medicare Other | Source: Ambulatory Visit | Attending: Radiation Oncology | Admitting: Radiation Oncology

## 2019-09-15 DIAGNOSIS — C61 Malignant neoplasm of prostate: Secondary | ICD-10-CM | POA: Diagnosis not present

## 2019-09-18 ENCOUNTER — Ambulatory Visit
Admission: RE | Admit: 2019-09-18 | Discharge: 2019-09-18 | Disposition: A | Discharge status: Home / Self Care | Payer: Medicare Other | Source: Ambulatory Visit | Attending: Radiation Oncology | Admitting: Radiation Oncology

## 2019-09-18 ENCOUNTER — Other Ambulatory Visit: Payer: Self-pay

## 2019-09-18 DIAGNOSIS — C61 Malignant neoplasm of prostate: Secondary | ICD-10-CM | POA: Diagnosis not present

## 2019-09-19 ENCOUNTER — Ambulatory Visit
Admission: RE | Admit: 2019-09-19 | Discharge: 2019-09-19 | Disposition: A | Discharge status: Home / Self Care | Payer: Medicare Other | Source: Ambulatory Visit | Attending: Radiation Oncology | Admitting: Radiation Oncology

## 2019-09-19 ENCOUNTER — Other Ambulatory Visit: Payer: Self-pay

## 2019-09-19 DIAGNOSIS — C61 Malignant neoplasm of prostate: Secondary | ICD-10-CM | POA: Diagnosis not present

## 2019-09-20 ENCOUNTER — Ambulatory Visit
Admission: RE | Admit: 2019-09-20 | Discharge: 2019-09-20 | Disposition: A | Discharge status: Home / Self Care | Payer: Medicare Other | Source: Ambulatory Visit | Attending: Radiation Oncology | Admitting: Radiation Oncology

## 2019-09-20 ENCOUNTER — Other Ambulatory Visit: Payer: Self-pay

## 2019-09-20 DIAGNOSIS — C61 Malignant neoplasm of prostate: Secondary | ICD-10-CM | POA: Diagnosis not present

## 2019-09-21 ENCOUNTER — Other Ambulatory Visit: Payer: Self-pay

## 2019-09-21 ENCOUNTER — Ambulatory Visit
Admission: RE | Admit: 2019-09-21 | Discharge: 2019-09-21 | Disposition: A | Discharge status: Home / Self Care | Payer: Medicare Other | Source: Ambulatory Visit | Attending: Radiation Oncology | Admitting: Radiation Oncology

## 2019-09-21 DIAGNOSIS — C61 Malignant neoplasm of prostate: Secondary | ICD-10-CM | POA: Diagnosis not present

## 2019-09-22 ENCOUNTER — Other Ambulatory Visit: Payer: Self-pay

## 2019-09-22 ENCOUNTER — Ambulatory Visit
Admission: RE | Admit: 2019-09-22 | Discharge: 2019-09-22 | Disposition: A | Discharge status: Home / Self Care | Payer: Medicare Other | Source: Ambulatory Visit | Attending: Radiation Oncology | Admitting: Radiation Oncology

## 2019-09-22 DIAGNOSIS — C61 Malignant neoplasm of prostate: Secondary | ICD-10-CM | POA: Diagnosis not present

## 2019-09-25 ENCOUNTER — Other Ambulatory Visit: Payer: Medicare Other

## 2019-09-25 ENCOUNTER — Ambulatory Visit
Admission: RE | Admit: 2019-09-25 | Discharge: 2019-09-25 | Disposition: A | Discharge status: Home / Self Care | Payer: Medicare Other | Source: Ambulatory Visit | Attending: Radiation Oncology | Admitting: Radiation Oncology

## 2019-09-25 ENCOUNTER — Other Ambulatory Visit: Payer: Self-pay

## 2019-09-25 ENCOUNTER — Ambulatory Visit: Payer: Medicare Other | Admitting: Oncology

## 2019-09-25 DIAGNOSIS — C61 Malignant neoplasm of prostate: Secondary | ICD-10-CM | POA: Diagnosis not present

## 2019-09-26 ENCOUNTER — Other Ambulatory Visit: Payer: Self-pay

## 2019-09-26 ENCOUNTER — Ambulatory Visit
Admission: RE | Admit: 2019-09-26 | Discharge: 2019-09-26 | Disposition: A | Discharge status: Home / Self Care | Payer: Medicare Other | Source: Ambulatory Visit | Attending: Radiation Oncology | Admitting: Radiation Oncology

## 2019-09-26 DIAGNOSIS — M9903 Segmental and somatic dysfunction of lumbar region: Secondary | ICD-10-CM | POA: Diagnosis not present

## 2019-09-26 DIAGNOSIS — M9905 Segmental and somatic dysfunction of pelvic region: Secondary | ICD-10-CM | POA: Diagnosis not present

## 2019-09-26 DIAGNOSIS — C61 Malignant neoplasm of prostate: Secondary | ICD-10-CM | POA: Diagnosis not present

## 2019-09-26 DIAGNOSIS — M5416 Radiculopathy, lumbar region: Secondary | ICD-10-CM | POA: Diagnosis not present

## 2019-09-26 DIAGNOSIS — M955 Acquired deformity of pelvis: Secondary | ICD-10-CM | POA: Diagnosis not present

## 2019-09-27 ENCOUNTER — Ambulatory Visit
Admission: RE | Admit: 2019-09-27 | Discharge: 2019-09-27 | Disposition: A | Discharge status: Home / Self Care | Payer: Medicare Other | Source: Ambulatory Visit | Attending: Radiation Oncology | Admitting: Radiation Oncology

## 2019-09-27 ENCOUNTER — Other Ambulatory Visit: Payer: Self-pay

## 2019-09-27 DIAGNOSIS — C61 Malignant neoplasm of prostate: Secondary | ICD-10-CM | POA: Diagnosis not present

## 2019-09-28 ENCOUNTER — Ambulatory Visit
Admission: RE | Admit: 2019-09-28 | Discharge: 2019-09-28 | Disposition: A | Discharge status: Home / Self Care | Payer: Medicare Other | Source: Ambulatory Visit | Attending: Radiation Oncology | Admitting: Radiation Oncology

## 2019-09-28 ENCOUNTER — Inpatient Hospital Stay: Payer: Medicare Other

## 2019-09-28 ENCOUNTER — Other Ambulatory Visit: Payer: Self-pay

## 2019-09-28 DIAGNOSIS — C61 Malignant neoplasm of prostate: Secondary | ICD-10-CM | POA: Diagnosis not present

## 2019-09-28 DIAGNOSIS — Z5111 Encounter for antineoplastic chemotherapy: Secondary | ICD-10-CM | POA: Diagnosis not present

## 2019-09-28 LAB — CBC
HCT: 36.2 % — ABNORMAL LOW (ref 39.0–52.0)
Hemoglobin: 11.8 g/dL — ABNORMAL LOW (ref 13.0–17.0)
MCH: 31.8 pg (ref 26.0–34.0)
MCHC: 32.6 g/dL (ref 30.0–36.0)
MCV: 97.6 fL (ref 80.0–100.0)
Platelets: 161 10*3/uL (ref 150–400)
RBC: 3.71 MIL/uL — ABNORMAL LOW (ref 4.22–5.81)
RDW: 13.8 % (ref 11.5–15.5)
WBC: 5.1 10*3/uL (ref 4.0–10.5)
nRBC: 0 % (ref 0.0–0.2)

## 2019-09-29 ENCOUNTER — Other Ambulatory Visit: Payer: Self-pay

## 2019-09-29 ENCOUNTER — Ambulatory Visit: Payer: Medicare Other

## 2019-09-29 ENCOUNTER — Ambulatory Visit
Admission: RE | Admit: 2019-09-29 | Discharge: 2019-09-29 | Disposition: A | Discharge status: Home / Self Care | Payer: Medicare Other | Source: Ambulatory Visit | Attending: Radiation Oncology | Admitting: Radiation Oncology

## 2019-09-29 DIAGNOSIS — C61 Malignant neoplasm of prostate: Secondary | ICD-10-CM | POA: Diagnosis not present

## 2019-10-02 ENCOUNTER — Other Ambulatory Visit
Admission: RE | Admit: 2019-10-02 | Discharge: 2019-10-02 | Disposition: A | Discharge status: Home / Self Care | Payer: Medicare Other | Source: Ambulatory Visit | Attending: Radiation Oncology | Admitting: Radiation Oncology

## 2019-10-02 ENCOUNTER — Ambulatory Visit
Admission: RE | Admit: 2019-10-02 | Discharge: 2019-10-02 | Disposition: A | Discharge status: Home / Self Care | Payer: Medicare Other | Source: Ambulatory Visit | Attending: Radiation Oncology | Admitting: Radiation Oncology

## 2019-10-02 ENCOUNTER — Other Ambulatory Visit: Payer: Self-pay

## 2019-10-02 DIAGNOSIS — C61 Malignant neoplasm of prostate: Secondary | ICD-10-CM | POA: Diagnosis not present

## 2019-10-02 DIAGNOSIS — Z20822 Contact with and (suspected) exposure to covid-19: Secondary | ICD-10-CM | POA: Insufficient documentation

## 2019-10-02 DIAGNOSIS — Z01812 Encounter for preprocedural laboratory examination: Secondary | ICD-10-CM | POA: Diagnosis not present

## 2019-10-02 LAB — SARS CORONAVIRUS 2 (TAT 6-24 HRS): SARS Coronavirus 2: NEGATIVE

## 2019-10-03 ENCOUNTER — Other Ambulatory Visit: Payer: Self-pay

## 2019-10-03 ENCOUNTER — Ambulatory Visit
Admission: RE | Admit: 2019-10-03 | Discharge: 2019-10-03 | Disposition: A | Discharge status: Home / Self Care | Payer: Medicare Other | Source: Ambulatory Visit | Attending: Radiation Oncology | Admitting: Radiation Oncology

## 2019-10-03 DIAGNOSIS — C61 Malignant neoplasm of prostate: Secondary | ICD-10-CM | POA: Diagnosis not present

## 2019-10-04 ENCOUNTER — Ambulatory Visit: Admission: RE | Admit: 2019-10-04 | Payer: Medicare Other | Source: Home / Self Care

## 2019-10-04 ENCOUNTER — Other Ambulatory Visit: Payer: Self-pay

## 2019-10-04 ENCOUNTER — Ambulatory Visit: Payer: Medicare Other

## 2019-10-04 ENCOUNTER — Encounter: Payer: Self-pay | Admitting: Radiation Oncology

## 2019-10-04 ENCOUNTER — Ambulatory Visit
Admission: RE | Admit: 2019-10-04 | Discharge: 2019-10-04 | Disposition: A | Discharge status: Home / Self Care | Payer: Medicare Other | Source: Ambulatory Visit | Attending: Radiation Oncology | Admitting: Radiation Oncology

## 2019-10-04 VITALS — BP 153/62 | HR 58 | Resp 18 | Wt 244.4 lb

## 2019-10-04 DIAGNOSIS — C61 Malignant neoplasm of prostate: Secondary | ICD-10-CM

## 2019-10-04 DIAGNOSIS — Z5111 Encounter for antineoplastic chemotherapy: Secondary | ICD-10-CM | POA: Insufficient documentation

## 2019-10-04 SURGERY — ULTRASOUND, PROSTATE, FOR VOLUME DETERMINATION
Anesthesia: Choice

## 2019-10-04 NOTE — H&P (Signed)
NEW PATIENT EVALUATION  Name: Cesar Powell  MRN: [id number]  Date:   10/04/2019     DOB: October 29, 1930   This 86 y.o. male patient presents to the clinic for history and physical prior to I-125 interstitial implant to his prostate  REFERRING PHYSICIAN: Owens Loffler, MD  CHIEF COMPLAINT:  Chief Complaint  Patient presents with  . Prostate Cancer    Post volume study    DIAGNOSIS: The encounter diagnosis was Prostate cancer ([place]).   PREVIOUS INVESTIGATIONS:  Clinical notes reviewed Volume study performed  HPI: Patient is an 86 year old male in overall excellent general condition whose history dates back to external beam radiation therapy to his prostate and 2005 for adenocarcinoma of the prostate.  He underwent an Axumin PET CT back in late 2020 when his PSA which had elevated in 2019 reached 5.7.  He had involvement of distal retroperitoneal bilateral common iliac and right external iliac lymph nodes concerning for nodal metastasis.  He also had an SUV of 3.6 when the prostate gland indicative of residual disease.  He had been on and off androgen deprivation therapy for years although he had discontinued that.  Patient declined further androgen deprivation therapy was referred and has undergone external beam radiation therapy to his pelvic lymph nodes.  He is now seen for volume study anticipation of an I-125 interstitial implant in the boost dose range to treat residual prostate cancer within his gland.  He is doing well has tolerated external beam treatment extremely well without significant side effects or complaints.  PLANNED TREATMENT REGIMEN: I-125 interstitial implant  PAST MEDICAL HISTORY:  has a past medical history of Discoloration of nail, H/O prostate cancer, Hypertension, Local recurrence of prostate cancer ([place]) (06/22/2007), and Shingles.    PAST SURGICAL HISTORY:  Past Surgical History:  Procedure Laterality Date  . ABDOMINAL SURGERY     ruptured intestines   .  CATARACT EXTRACTION W/PHACO Left 01/29/2016   Procedure: CATARACT EXTRACTION PHACO AND INTRAOCULAR LENS PLACEMENT ([place]) left eye;  Surgeon: Leandrew Koyanagi, MD;  Location: [facility];  Service: Ophthalmology;  Laterality: Left;  RESTOR LENS  . CATARACT EXTRACTION W/PHACO Right 10/21/2016   Procedure: CATARACT EXTRACTION PHACO AND INTRAOCULAR LENS PLACEMENT ([facility])  Right restor toric lens;  Surgeon: Leandrew Koyanagi, MD;  Location: [place];  Service: Ophthalmology;  Laterality: Right;  restor toric lens  . CYSTOSCOPY WITH LITHOLAPAXY N/A 08/14/2016   Procedure: CYSTOSCOPY WITH LITHOLAPAXY;  Surgeon: Nickie Retort, MD;  Location: [facility];  Service: Urology;  Laterality: N/A;  . FEMUR IM NAIL Right 10/08/2014   Procedure: INTRAMEDULLARY (IM) NAIL FEMORAL;  Surgeon: Rod Can, MD;  Location: [place];  Service: Orthopedics;  Laterality: Right;  PER DANIELLE 110 MIN  . HOLMIUM LASER APPLICATION  [id number]   Procedure: HOLMIUM LASER APPLICATION;  Surgeon: Nickie Retort, MD;  Location: [facility];  Service: Urology;;  . OTHER SURGICAL HISTORY  2006   Prostate Radiation Surgery     FAMILY HISTORY: family history includes Heart attack in his brother; Heart disease in his brother; Lung cancer in his brother.  SOCIAL HISTORY:  reports that he quit smoking about 54 years ago. His smoking use included cigarettes. He has a 15.00 pack-year smoking history. He has never used smokeless tobacco. He reports previous alcohol use. He reports that he does not use drugs.  ALLERGIES: Patient has no known allergies.  MEDICATIONS:  Current Outpatient Medications  Medication Sig Dispense Refill  . ascorbic acid (RA VITAMIN C)  500 MG tablet Take 1,000 mg by mouth daily.    . Cholecalciferol (VITAMIN D3) 50 MCG (2000 UT) TABS Take 2,000 Units by mouth daily.    . Flaxseed, Linseed, (FLAXSEED OIL PO) Take 1 capsule by mouth daily.    . Misc Natural Products (IMMUNE FORMULA PO) Take 2  capsules by mouth daily. Nutriferon by [employer]    . Misc Natural Products (JOINT HEALTH PO) Take 2 tablets by mouth daily.    . Multiple Vitamin (MULTIVITAMIN WITH MINERALS) TABS tablet Take 2 tablets by mouth daily. Vita-Lea    . Multiple Vitamins-Minerals (ZINC PO) Take 1 tablet by mouth daily.    . Nutritional Supplements (IMMUNE ENHANCE) TABS Take 1 tablet by mouth daily.    . Probiotic Product (PROBIOTIC PO) Take 1 capsule by mouth daily.    [name] RESVERATROL PO Take 2 capsules by mouth daily. Vivix    . tamsulosin (FLOMAX) 0.4 MG CAPS capsule Take 2 capsules (0.8 mg total) by mouth daily. (Patient taking differently: Take 0.4 mg by mouth in the morning and at bedtime. ) 60 capsule 11  . TURMERIC PO Take 1 capsule by mouth daily.     No current facility-administered medications for this encounter.    ECOG PERFORMANCE STATUS:  0 - Asymptomatic  REVIEW OF SYSTEMS: Patient denies any weight loss, fatigue, weakness, fever, chills or night sweats. Patient denies any loss of vision, blurred vision. Patient denies any ringing  of the ears or hearing loss. No irregular heartbeat. Patient denies heart murmur or history of fainting. Patient denies any chest pain or pain radiating to her upper extremities. Patient denies any shortness of breath, difficulty breathing at night, cough or hemoptysis. Patient denies any swelling in the lower legs. Patient denies any nausea vomiting, vomiting of blood, or coffee ground material in the vomitus. Patient denies any stomach pain. Patient states has had normal bowel movements no significant constipation or diarrhea. Patient denies any dysuria, hematuria or significant nocturia. Patient denies any problems walking, swelling in the joints or loss of balance. Patient denies any skin changes, loss of hair or loss of weight. Patient denies any excessive worrying or anxiety or significant depression. Patient denies any problems with insomnia. Patient denies excessive thirst,  polyuria, polydipsia. Patient denies any swollen glands, patient denies easy bruising or easy bleeding. Patient denies any recent infections, allergies or URI. Patient "s visual fields have not changed significantly in recent time.   PHYSICAL EXAM: BP (!) 153/62 (BP Location: Left Arm, Patient Position: Sitting)   Pulse (!) 58   Resp 18   Wt 244 lb 6.4 oz (110.9 kg)   BMI 38.57 kg/m  Well-developed well-nourished patient in NAD. HEENT reveals PERLA, EOMI, discs not visualized.  Oral cavity is clear. No oral mucosal lesions are identified. Neck is clear without evidence of cervical or supraclavicular adenopathy. Lungs are clear to A&P. Cardiac examination is essentially unremarkable with regular rate and rhythm without murmur rub or thrill. Abdomen is benign with no organomegaly or masses noted. Motor sensory and DTR levels are equal and symmetric in the upper and lower extremities. Cranial nerves II through XII are grossly intact. Proprioception is intact. No peripheral adenopathy or edema is identified. No motor or sensory levels are noted. Crude visual fields are within normal range.  LABORATORY DATA: Labs reviewed    RADIOLOGY RESULTS: Volume study performed today for I-125 interstitial implant   IMPRESSION: Patient tolerated volume study well with excellent's acquisition of images  PLAN: This time  patient is cleared for I-125 interstitial implant.  Risks and benefits including radiation safety precautions were reviewed with the patient.  Risks include increased lower urinary tract symptoms fatigue possible slight chance of diarrhea all were explained in detail to the patient.  Also risks and benefits of general anesthesia.  Patient comprehends her treatment plan well.  I would like to take this opportunity to thank you for allowing me to participate in the care of your patient.Noreene Filbert, MD

## 2019-10-04 NOTE — Op Note (Signed)
Radiation Oncology Follow up Note  Name: Cesar Powell   Date:   09/07/2019 MRN:  [id number] DOB: 1931/05/20    This 86 y.o. male presents to the OR today for volume study anticipation of I-125 interstitial implant REFERRING PROVIDER: No ref. provider found  HPI: Patient is an 86 year old male seen today for volume study in anticipation of I-125 interstitial implant..Patient is an 86 year old male in overall excellent general condition whose history dates back to external beam radiation therapy to his prostate and 2005 for adenocarcinoma of the prostate.  He underwent an Axumin PET CT back in late 2020 when his PSA which had elevated in 2019 reached 5.7.  He had involvement of distal retroperitoneal bilateral common iliac and right external iliac lymph nodes concerning for nodal metastasis.  He also had an SUV of 3.6 when the prostate gland indicative of residual disease.  He had been on and off androgen deprivation therapy for years although he had discontinued that.  Patient declined further androgen deprivation therapy was referred and has undergone external beam radiation therapy to his pelvic lymph nodes.  He is now seen for volume study anticipation of an I-125 interstitial implant in the boost dose range to treat residual prostate cancer within his gland.  He is doing well has tolerated external beam treatment extremely well without significant side effects or   COMPLICATIONS OF TREATMENT: none  FOLLOW UP COMPLIANCE: keeps appointments   PHYSICAL EXAM:  There were no vitals taken for this visit. Well-developed well-nourished patient in NAD. HEENT reveals PERLA, EOMI, discs not visualized.  Oral cavity is clear. No oral mucosal lesions are identified. Neck is clear without evidence of cervical or supraclavicular adenopathy. Lungs are clear to A&P. Cardiac examination is essentially unremarkable with regular rate and rhythm without murmur rub or thrill. Abdomen is benign with no organomegaly  or masses noted. Motor sensory and DTR levels are equal and symmetric in the upper and lower extremities. Cranial nerves II through XII are grossly intact. Proprioception is intact. No peripheral adenopathy or edema is identified. No motor or sensory levels are noted. Crude visual fields are within normal range.  RADIOLOGY RESULTS: Ultrasound used for volume study  PLAN: Patient was taken to the cystoscopy suite in the OR. Patient was placed in the low lithotomy position. Foley catheter was placed. Trans-rectal ultrasound probe was inserted into the rectum and prostate seminal vesicles were visualized as well as bladder base. stepping images were performed on a 5 mm increments. Images will be placed in BrachyVision treatment planning system to determine seed placement coordinates for eventual I-125 interstitial implant. Images will be reviewed with the physics and dosimetry staff for final quality approval. I personally was present for the volume study and assisted in delineation of contour volumes.  At the end of the procedure Foley catheter was removed, rectal ultrasound probe was removed. Patient tolerated his procedures extremely well with no side effects or complaints. Patient has given appointment for interstitial implant date. Consent was signed today as well as history and physical performed in preparation for his outpatient surgical implant.     Noreene Filbert, MD

## 2019-10-05 ENCOUNTER — Ambulatory Visit
Admission: RE | Admit: 2019-10-05 | Discharge: 2019-10-05 | Disposition: A | Discharge status: Home / Self Care | Payer: Medicare Other | Source: Ambulatory Visit | Attending: Radiation Oncology | Admitting: Radiation Oncology

## 2019-10-05 ENCOUNTER — Other Ambulatory Visit: Payer: Self-pay

## 2019-10-05 DIAGNOSIS — C61 Malignant neoplasm of prostate: Secondary | ICD-10-CM | POA: Diagnosis not present

## 2019-10-06 ENCOUNTER — Other Ambulatory Visit: Payer: Self-pay

## 2019-10-06 ENCOUNTER — Ambulatory Visit
Admission: RE | Admit: 2019-10-06 | Discharge: 2019-10-06 | Disposition: A | Discharge status: Home / Self Care | Payer: Medicare Other | Source: Ambulatory Visit | Attending: Radiation Oncology | Admitting: Radiation Oncology

## 2019-10-06 ENCOUNTER — Ambulatory Visit: Payer: Medicare Other

## 2019-10-06 DIAGNOSIS — C61 Malignant neoplasm of prostate: Secondary | ICD-10-CM | POA: Diagnosis not present

## 2019-10-09 ENCOUNTER — Ambulatory Visit
Admission: RE | Admit: 2019-10-09 | Discharge: 2019-10-09 | Disposition: A | Discharge status: Home / Self Care | Payer: Medicare Other | Source: Ambulatory Visit | Attending: Radiation Oncology | Admitting: Radiation Oncology

## 2019-10-09 ENCOUNTER — Other Ambulatory Visit: Payer: Self-pay

## 2019-10-09 DIAGNOSIS — C61 Malignant neoplasm of prostate: Secondary | ICD-10-CM | POA: Diagnosis not present

## 2019-10-10 DIAGNOSIS — C61 Malignant neoplasm of prostate: Secondary | ICD-10-CM | POA: Diagnosis not present

## 2019-10-10 DIAGNOSIS — Z5111 Encounter for antineoplastic chemotherapy: Secondary | ICD-10-CM | POA: Diagnosis not present

## 2019-10-12 ENCOUNTER — Inpatient Hospital Stay: Payer: Medicare Other

## 2019-10-19 ENCOUNTER — Other Ambulatory Visit: Payer: Self-pay

## 2019-10-19 ENCOUNTER — Other Ambulatory Visit: Payer: Self-pay | Admitting: Radiology

## 2019-10-19 ENCOUNTER — Encounter
Admission: RE | Admit: 2019-10-19 | Discharge: 2019-10-19 | Disposition: A | Discharge status: Home / Self Care | Payer: Medicare Other | Source: Ambulatory Visit | Attending: Urology | Admitting: Urology

## 2019-10-19 DIAGNOSIS — C61 Malignant neoplasm of prostate: Secondary | ICD-10-CM

## 2019-10-19 NOTE — Patient Instructions (Signed)
Your procedure is scheduled on: Monday October 30, 2019 Report to Day Surgery. To find out your arrival time please call [phone] between 1PM - 3PM on Friday October 27, 2019.  Remember: Instructions that are not followed completely may result in serious medical risk,  up to and including death, or upon the discretion of your surgeon and anesthesiologist your  surgery may need to be rescheduled.     _X__ 1. Do not eat food after midnight the night before your procedure.                 No gum chewing or hard candies. You may drink clear liquids up to 2 hours                 before you are scheduled to arrive for your surgery- DO not drink clear                 liquids within 2 hours of the start of your surgery.                 Clear Liquids include:  water, apple juice without pulp, clear Gatorade, G2 or                  Gatorade Zero (avoid Red/Purple/Blue), Black Coffee or Tea (Do not add                 anything to coffee or tea).  __X__2.  On the morning of surgery brush your teeth with toothpaste and water, you                may rinse your mouth with mouthwash if you wish.  Do not swallow any toothpaste of mouthwash.     _X__ 3.  No Alcohol for 24 hours before or after surgery.   _X__ 4.  Do Not Smoke or use e-cigarettes For 24 Hours Prior to Your Surgery.                 Do not use any chewable tobacco products for at least 6 hours prior to                 surgery.   __x__  5.  Notify your doctor if there is any change in your medical condition      (cold, fever, infections).     Do not wear jewelry, make-up, hairpins, clips or nail polish. Do not wear lotions, powders, or perfumes. You may wear deodorant. Do not shave 48 hours prior to surgery. Men may shave face and neck. Do not bring valuables to the hospital.    [facility] is not responsible for any belongings or valuables.  Contacts, dentures or bridgework may not be worn into surgery. Leave  your suitcase in the car. After surgery it may be brought to your room. For patients admitted to the hospital, discharge time is determined by your treatment team.   Patients discharged the day of surgery will not be allowed to drive home.   Make arrangements for someone to be with you for the first 24 hours of your Same Day Discharge.   __x__ Take these medicines the morning of surgery with A SIP OF WATER:    1. tamsulosin (FLOMAX) 0.4    __x__ Fleet Enema (as directed)  __x__ Stop Anti-inflammatories such as ibuprofen, aspirins, naproxen, Aleve and or BC powders.   __x__ Stop supplements until after surgery.    __x__ Do not start any  herbal supplements before your surgery.

## 2019-10-24 DIAGNOSIS — M5416 Radiculopathy, lumbar region: Secondary | ICD-10-CM | POA: Diagnosis not present

## 2019-10-24 DIAGNOSIS — M955 Acquired deformity of pelvis: Secondary | ICD-10-CM | POA: Diagnosis not present

## 2019-10-24 DIAGNOSIS — M9903 Segmental and somatic dysfunction of lumbar region: Secondary | ICD-10-CM | POA: Diagnosis not present

## 2019-10-24 DIAGNOSIS — M9905 Segmental and somatic dysfunction of pelvic region: Secondary | ICD-10-CM | POA: Diagnosis not present

## 2019-10-26 ENCOUNTER — Other Ambulatory Visit: Payer: Self-pay

## 2019-10-26 ENCOUNTER — Encounter
Admission: RE | Admit: 2019-10-26 | Discharge: 2019-10-26 | Disposition: A | Discharge status: Home / Self Care | Payer: Medicare Other | Source: Ambulatory Visit | Attending: Urology | Admitting: Urology

## 2019-10-26 DIAGNOSIS — Z20822 Contact with and (suspected) exposure to covid-19: Secondary | ICD-10-CM | POA: Diagnosis not present

## 2019-10-26 DIAGNOSIS — Z01818 Encounter for other preprocedural examination: Secondary | ICD-10-CM | POA: Insufficient documentation

## 2019-10-26 DIAGNOSIS — R9431 Abnormal electrocardiogram [ECG] [EKG]: Secondary | ICD-10-CM | POA: Diagnosis not present

## 2019-10-26 DIAGNOSIS — I440 Atrioventricular block, first degree: Secondary | ICD-10-CM | POA: Diagnosis not present

## 2019-10-26 DIAGNOSIS — Z01810 Encounter for preprocedural cardiovascular examination: Secondary | ICD-10-CM | POA: Diagnosis not present

## 2019-10-26 DIAGNOSIS — R001 Bradycardia, unspecified: Secondary | ICD-10-CM | POA: Diagnosis not present

## 2019-10-26 LAB — CBC
HCT: 34.8 % — ABNORMAL LOW (ref 39.0–52.0)
Hemoglobin: 11.6 g/dL — ABNORMAL LOW (ref 13.0–17.0)
MCH: 32.3 pg (ref 26.0–34.0)
MCHC: 33.3 g/dL (ref 30.0–36.0)
MCV: 96.9 fL (ref 80.0–100.0)
Platelets: 190 10*3/uL (ref 150–400)
RBC: 3.59 MIL/uL — ABNORMAL LOW (ref 4.22–5.81)
RDW: 14.3 % (ref 11.5–15.5)
WBC: 4.8 10*3/uL (ref 4.0–10.5)
nRBC: 0 % (ref 0.0–0.2)

## 2019-10-26 LAB — SARS CORONAVIRUS 2 (TAT 6-24 HRS): SARS Coronavirus 2: NEGATIVE

## 2019-10-29 MED ORDER — CIPROFLOXACIN IN D5W 400 MG/200ML IV SOLN
400.0000 mg | INTRAVENOUS | Status: AC
  Administered 2019-10-30: 400 mg via INTRAVENOUS

## 2019-10-30 ENCOUNTER — Ambulatory Visit: Payer: Medicare Other | Admitting: Anesthesiology

## 2019-10-30 ENCOUNTER — Ambulatory Visit: Payer: Medicare Other

## 2019-10-30 ENCOUNTER — Ambulatory Visit
Admission: RE | Admit: 2019-10-30 | Discharge: 2019-10-30 | Disposition: A | Discharge status: Home / Self Care | Payer: Medicare Other | Attending: Urology | Admitting: Urology

## 2019-10-30 ENCOUNTER — Other Ambulatory Visit: Payer: Self-pay

## 2019-10-30 ENCOUNTER — Encounter
Admission: RE | Disposition: A | Discharge status: Home / Self Care | Payer: Self-pay | Source: Home / Self Care | Attending: Urology

## 2019-10-30 DIAGNOSIS — Z801 Family history of malignant neoplasm of trachea, bronchus and lung: Secondary | ICD-10-CM | POA: Diagnosis not present

## 2019-10-30 DIAGNOSIS — C61 Malignant neoplasm of prostate: Secondary | ICD-10-CM | POA: Insufficient documentation

## 2019-10-30 DIAGNOSIS — I10 Essential (primary) hypertension: Secondary | ICD-10-CM | POA: Diagnosis not present

## 2019-10-30 DIAGNOSIS — Z8249 Family history of ischemic heart disease and other diseases of the circulatory system: Secondary | ICD-10-CM | POA: Insufficient documentation

## 2019-10-30 DIAGNOSIS — Z87891 Personal history of nicotine dependence: Secondary | ICD-10-CM | POA: Diagnosis not present

## 2019-10-30 HISTORY — PX: RADIOACTIVE SEED IMPLANT: SHX5150

## 2019-10-30 SURGERY — INSERTION, RADIATION SOURCE, PROSTATE
Anesthesia: General

## 2019-10-30 MED ORDER — LACTATED RINGERS IV SOLN: INTRAVENOUS | Status: DC

## 2019-10-30 MED ORDER — ROCURONIUM BROMIDE 10 MG/ML (PF) SYRINGE
PREFILLED_SYRINGE | INTRAVENOUS | Status: AC
  Filled 2019-10-30: qty 10

## 2019-10-30 MED ORDER — CIPROFLOXACIN HCL 500 MG PO TABS: 500.0000 mg | ORAL_TABLET | Freq: Two times a day (BID) | ORAL | 0 refills | Status: AC

## 2019-10-30 MED ORDER — FENTANYL CITRATE (PF) 100 MCG/2ML IJ SOLN
INTRAMUSCULAR | Status: AC
  Filled 2019-10-30: qty 2

## 2019-10-30 MED ORDER — SUGAMMADEX SODIUM 200 MG/2ML IV SOLN
INTRAVENOUS | Status: DC | PRN
  Administered 2019-10-30: 200 mg via INTRAVENOUS

## 2019-10-30 MED ORDER — SUCCINYLCHOLINE CHLORIDE 200 MG/10ML IV SOSY
PREFILLED_SYRINGE | INTRAVENOUS | Status: AC
  Filled 2019-10-30: qty 10

## 2019-10-30 MED ORDER — HYDROCODONE-ACETAMINOPHEN 5-325 MG PO TABS: 1.0000 | ORAL_TABLET | ORAL | 0 refills | Status: AC | PRN

## 2019-10-30 MED ORDER — FAMOTIDINE 20 MG PO TABS: 20.0000 mg | ORAL_TABLET | Freq: Once | ORAL | Status: AC

## 2019-10-30 MED ORDER — FAMOTIDINE 20 MG PO TABS
ORAL_TABLET | ORAL | Status: AC
  Administered 2019-10-30: 07:00:00 20 mg via ORAL
  Filled 2019-10-30: qty 1

## 2019-10-30 MED ORDER — ONDANSETRON HCL 4 MG/2ML IJ SOLN
INTRAMUSCULAR | Status: AC
  Filled 2019-10-30: qty 2

## 2019-10-30 MED ORDER — ONDANSETRON HCL 4 MG/2ML IJ SOLN: 4.0000 mg | Freq: Once | INTRAMUSCULAR | Status: DC | PRN

## 2019-10-30 MED ORDER — LIDOCAINE HCL (CARDIAC) PF 100 MG/5ML IV SOSY
PREFILLED_SYRINGE | INTRAVENOUS | Status: DC | PRN
  Administered 2019-10-30: 50 mg via INTRAVENOUS

## 2019-10-30 MED ORDER — ONDANSETRON HCL 4 MG/2ML IJ SOLN
INTRAMUSCULAR | Status: DC | PRN
  Administered 2019-10-30: 4 mg via INTRAVENOUS

## 2019-10-30 MED ORDER — DEXAMETHASONE SODIUM PHOSPHATE 10 MG/ML IJ SOLN
INTRAMUSCULAR | Status: DC | PRN
  Administered 2019-10-30: 5 mg via INTRAVENOUS

## 2019-10-30 MED ORDER — FLEET ENEMA 7-19 GM/118ML RE ENEM: 1.0000 | ENEMA | Freq: Once | RECTAL | Status: DC

## 2019-10-30 MED ORDER — FENTANYL CITRATE (PF) 100 MCG/2ML IJ SOLN
INTRAMUSCULAR | Status: DC | PRN
  Administered 2019-10-30 (×2): 50 ug via INTRAVENOUS

## 2019-10-30 MED ORDER — DEXAMETHASONE SODIUM PHOSPHATE 10 MG/ML IJ SOLN
INTRAMUSCULAR | Status: AC
  Filled 2019-10-30: qty 1

## 2019-10-30 MED ORDER — CIPROFLOXACIN IN D5W 400 MG/200ML IV SOLN
INTRAVENOUS | Status: AC
  Filled 2019-10-30: qty 200

## 2019-10-30 MED ORDER — PROPOFOL 10 MG/ML IV BOLUS
INTRAVENOUS | Status: DC | PRN
  Administered 2019-10-30: 120 mg via INTRAVENOUS

## 2019-10-30 MED ORDER — LIDOCAINE HCL (PF) 2 % IJ SOLN
INTRAMUSCULAR | Status: AC
  Filled 2019-10-30: qty 10

## 2019-10-30 MED ORDER — PROPOFOL 10 MG/ML IV BOLUS
INTRAVENOUS | Status: AC
  Filled 2019-10-30: qty 20

## 2019-10-30 MED ORDER — BACITRACIN 500 UNIT/GM EX OINT
TOPICAL_OINTMENT | CUTANEOUS | Status: DC | PRN
  Administered 2019-10-30: 1 via TOPICAL

## 2019-10-30 MED ORDER — FENTANYL CITRATE (PF) 100 MCG/2ML IJ SOLN: 25.0000 ug | INTRAMUSCULAR | Status: DC | PRN

## 2019-10-30 MED ORDER — BACITRACIN ZINC 500 UNIT/GM EX OINT
TOPICAL_OINTMENT | CUTANEOUS | Status: AC
  Filled 2019-10-30: qty 28.35

## 2019-10-30 MED ORDER — SUCCINYLCHOLINE CHLORIDE 20 MG/ML IJ SOLN
INTRAMUSCULAR | Status: DC | PRN
  Administered 2019-10-30: 100 mg via INTRAVENOUS

## 2019-10-30 MED ORDER — ROCURONIUM BROMIDE 100 MG/10ML IV SOLN
INTRAVENOUS | Status: DC | PRN
  Administered 2019-10-30: 25 mg via INTRAVENOUS
  Administered 2019-10-30: 5 mg via INTRAVENOUS

## 2019-10-30 SURGICAL SUPPLY — 24 items
BAG URINE DRAIN 2000ML AR STRL (UROLOGICAL SUPPLIES) ×3 IMPLANT
BLADE CLIPPER SURG (BLADE) ×3 IMPLANT
BRUSH SCRUB EZ 1% IODOPHOR (MISCELLANEOUS) ×3 IMPLANT
CATH FOL 2WAY LX 16X5 (CATHETERS) ×3 IMPLANT
COVER BACK TABLE REUSABLE LG (DRAPES) ×3 IMPLANT
DRAPE INCISE 23X17 IOBAN STRL (DRAPES) ×2
DRAPE INCISE IOBAN 23X17 STRL (DRAPES) ×1 IMPLANT
DRAPE UNDER BUTTOCK W/FLU (DRAPES) ×3 IMPLANT
DRSG TELFA 3X8 NADH (GAUZE/BANDAGES/DRESSINGS) ×3 IMPLANT
GLOVE BIO SURGEON STRL SZ7.5 (GLOVE) ×6 IMPLANT
GLOVE BIOGEL PI IND STRL 7.5 (GLOVE) ×2 IMPLANT
GLOVE BIOGEL PI INDICATOR 7.5 (GLOVE) ×4
GOWN STRL REUS W/ TWL LRG LVL3 (GOWN DISPOSABLE) ×1 IMPLANT
GOWN STRL REUS W/ TWL XL LVL3 (GOWN DISPOSABLE) ×2 IMPLANT
GOWN STRL REUS W/TWL LRG LVL3 (GOWN DISPOSABLE) ×3
GOWN STRL REUS W/TWL XL LVL3 (GOWN DISPOSABLE) ×6
IV NS 1000ML (IV SOLUTION) ×3
IV NS 1000ML BAXH (IV SOLUTION) ×1 IMPLANT
KIT TURNOVER CYSTO (KITS) ×3 IMPLANT
PACK CYSTO AR (MISCELLANEOUS) ×3 IMPLANT
SET CYSTO W/LG BORE CLAMP LF (SET/KITS/TRAYS/PACK) ×3 IMPLANT
SURGILUBE 2OZ TUBE FLIPTOP (MISCELLANEOUS) ×3 IMPLANT
SYR 10ML LL (SYRINGE) ×3 IMPLANT
WATER STERILE IRR 1000ML POUR (IV SOLUTION) ×3 IMPLANT

## 2019-10-30 NOTE — Transfer of Care (Signed)
Immediate Anesthesia Transfer of Care Note  Patient: Cesar Powell  Procedure(s) Performed: RADIOACTIVE SEED IMPLANT/BRACHYTHERAPY IMPLANT (N/A )  Patient Location: PACU  Anesthesia Type:General  Level of Consciousness: awake and alert   Airway & Oxygen Therapy: Patient Spontanous Breathing and Patient connected to face mask oxygen  Post-op Assessment: Report given to RN and Post -op Vital signs reviewed and stable  Post vital signs: Reviewed  Last Vitals:  Vitals Value Taken Time  BP 147/59 10/30/19 0829  Temp 36.1 C 10/30/19 0828  Pulse 57 10/30/19 0829  Resp 8 10/30/19 0829  SpO2 100 % 10/30/19 0829  Vitals shown include unvalidated device data.  Last Pain:  Vitals:   10/30/19 0828  TempSrc:   PainSc: (P) Asleep         Complications: No apparent anesthesia complications

## 2019-10-30 NOTE — H&P (Signed)
UROLOGY H&P UPDATE  Agree with prior H&P dated 10/04/19 by Dr. Baruch Gouty.  Cardiac: RRR Lungs: CTA bilaterally  Laterality: N/A Procedure: Brachytherapy implant  Informed consent obtained, we specifically discussed the risks of bleeding, infection, post-operative pain, need for additional procedures, urinary symptoms/retention, need for ongoing PSA monitoring.  Billey Co, MD 10/30/2019

## 2019-10-30 NOTE — Anesthesia Postprocedure Evaluation (Signed)
Anesthesia Post Note  Patient: Cesar Powell  Procedure(s) Performed: RADIOACTIVE SEED IMPLANT/BRACHYTHERAPY IMPLANT (N/A )  Patient location during evaluation: PACU Anesthesia Type: General Level of consciousness: awake and alert and oriented Pain management: pain level controlled Vital Signs Assessment: post-procedure vital signs reviewed and stable Respiratory status: spontaneous breathing, nonlabored ventilation and respiratory function stable Cardiovascular status: blood pressure returned to baseline and stable Postop Assessment: no signs of nausea or vomiting Anesthetic complications: no     Last Vitals:  Vitals:   10/30/19 0858 10/30/19 0906  BP: (!) 162/67 (!) 141/69  Pulse: (!) 57 (!) 56  Resp: 12 16  Temp: (!) 35.9 C (!) 35.9 C  SpO2: 98% 100%    Last Pain:  Vitals:   10/30/19 0906  TempSrc: Temporal  PainSc: 0-No pain                 Azana Kiesler

## 2019-10-30 NOTE — Anesthesia Preprocedure Evaluation (Signed)
Anesthesia Evaluation  Patient identified by MRN, date of birth, ID band Patient awake    Reviewed: Allergy & Precautions, NPO status , Patient's Chart, lab work & pertinent test results  History of Anesthesia Complications Negative for: history of anesthetic complications  Airway Mallampati: III  TM Distance: >3 FB Neck ROM: Full    Dental  (+) Poor Dentition   Pulmonary sleep apnea and Continuous Positive Airway Pressure Ventilation , neg COPD, former smoker,    breath sounds clear to auscultation- rhonchi (-) wheezing      Cardiovascular hypertension, Pt. on medications (-) CAD, (-) Past MI, (-) Cardiac Stents and (-) CABG  Rhythm:Regular Rate:Normal - Systolic murmurs and - Diastolic murmurs    Neuro/Psych neg Seizures negative neurological ROS  negative psych ROS   GI/Hepatic negative GI ROS, Neg liver ROS,   Endo/Other  negative endocrine ROSneg diabetes  Renal/GU negative Renal ROS     Musculoskeletal negative musculoskeletal ROS (+)   Abdominal (+) + obese,   Peds  Hematology negative hematology ROS (+)   Anesthesia Other Findings Past Medical History: No date: Discoloration of nail No date: H/O prostate cancer     Comment:  was treated with radiation No date: Hypertension 06/21/2004: Local recurrence of prostate cancer ([facility])     Comment:  Qualifier: Diagnosis of  By: Fuller Plan CMA (AAMA), [place]   No date: Shingles 1977: Sleep apnea     Comment:  uses C-pap    Reproductive/Obstetrics                             Anesthesia Physical Anesthesia Plan  ASA: II  Anesthesia Plan: General   Post-op Pain Management:    Induction: Intravenous  PONV Risk Score and Plan: 1 and Ondansetron and Dexamethasone  Airway Management Planned: Oral ETT  Additional Equipment:   Intra-op Plan:   Post-operative Plan: Extubation in OR  Informed Consent: I have reviewed the patients  History and Physical, chart, labs and discussed the procedure including the risks, benefits and alternatives for the proposed anesthesia with the patient or authorized representative who has indicated his/her understanding and acceptance.     Dental advisory given  Plan Discussed with: CRNA and Anesthesiologist  Anesthesia Plan Comments:         Anesthesia Quick Evaluation

## 2019-10-30 NOTE — Anesthesia Procedure Notes (Signed)
Procedure Name: Intubation Performed by: Rolla Plate, CRNA Pre-anesthesia Checklist: Patient identified, Patient being monitored, Timeout performed, Emergency Drugs available and Suction available Patient Re-evaluated:Patient Re-evaluated prior to induction Oxygen Delivery Method: Circle system utilized Preoxygenation: Pre-oxygenation with 100% oxygen Induction Type: IV induction Ventilation: Mask ventilation without difficulty Laryngoscope Size: 3, McGraph and 4 Grade View: Grade I Tube type: Oral Tube size: 7.5 mm Number of attempts: 1 Airway Equipment and Method: Stylet and Video-laryngoscopy Placement Confirmation: ETT inserted through vocal cords under direct vision,  positive ETCO2 and breath sounds checked- equal and bilateral Secured at: 23 cm Tube secured with: Tape Dental Injury: Teeth and Oropharynx as per pre-operative assessment

## 2019-10-30 NOTE — OR Nursing (Signed)
EKG reviewed by Dr. Randa Lynn and she advised " No issues".

## 2019-10-30 NOTE — Discharge Instructions (Signed)

## 2019-10-30 NOTE — Op Note (Signed)
Preoperative diagnosis: Adenocarcinoma of the prostate    Postoperative diagnosis: Same    Procedure: I-125 prostate seed implantation, cystoscopy   Surgeon: Nickolas Madrid, MD   Radiation Oncologist: Noreene Filbert, M.D.    Anesthesia: General   Drains: none   Complications: none   Indications: Prostate cancer.  86 year old male with complex history of prostate cancer previously treated with radiation in 2005, now with persistently rising PSA and castrate resistant prostate cancer, refusal of further ADT, and evidence of prostate cancer recurrence in the pelvic lymph nodes as well as locally in the prosate.  He was discussed in tumor board and it was felt he would benefit from external beam radiation to the lymph nodes and brachytherapy locally to the prostate.   Procedure: The patient was brought to operating suite and placement table in the supine position. At this time, a universal timeout protocol was performed, all team members were identified, Venodyne boots are placed, and he was administered IV Cipro in the preoperative period. He was placed in lithotomy position and prepped and draped in usual fashion. The radiation oncology department placed a transrectal ultrasound probe anchoring stand/ grid and aligned with previous imaging from the volume study. Foley catheter was inserted without difficulty.  All needle passage was done with real-time transrectal ultrasound guidance in both the transverse and sagittal plains in order to achieve the desired preplanned position. A total of 17 needles were placed.  41 active seeds were implanted. The Foley catheter was removed and a rigid cystoscopy failed to show any seeds outside the prostate without evidence of trauma to the urethral, prostatic fossa, or bladder.  The prostate was notable for mild to moderate trabeculations with a few small diverticula, but no mucosal lesions or tumors. The bladder was drained.  A fluoroscopic image was then obtained  showing excellent distrubution of the brachytherapy seeds.  Each seed was counted and counts were correct.     The patient was then repositioned in the supine position, reversed from anesthesia, and taken to the PACU in stable condition.  Nickolas Madrid, MD 10/30/2019

## 2019-10-30 NOTE — Op Note (Signed)
Radiation Oncology I-125 interstitial note  Name: Cesar Powell   Date:   09/07/2019 MRN:  [id number] DOB: Dec 23, 1930    This 86 y.o. male presents to the for I-125 interstitial implant as a boost and patient who has completed radiation therapy to his pelvic lymph nodes for recurrent adenocarcinoma the prostate  REFERRING PROVIDER: No ref. provider found  HPI:  Patient is an 86 year old male seen today for volume study in anticipation of I-125 interstitial implant..Patient is an 86 year old malein overall excellent general condition whose history dates back to external beam radiation therapy to his prostate and 2005 for adenocarcinoma of the prostate. He underwent an [place] in late 2020 when his PSA which had elevated in 2019 reached 5.7. He had involvement of distal retroperitoneal bilateral common iliac and right external iliac lymph nodes concerning for nodal metastasis. He also had an SUV of 3.6 when the prostate gland indicative of residual disease. He had been on and off androgen deprivation therapy for years although he had discontinued that. Patient declined further androgen deprivation therapy was referred and has undergone external beam radiation therapy to his pelvic lymph nodes. [name] He is seen in the hospital today for I-125 interstitial implant as a salvage for his initial prostate cancer  COMPLICATIONS OF TREATMENT: none  FOLLOW UP COMPLIANCE: keeps appointments   PHYSICAL EXAM:  BP (!) 155/52   Pulse 64   Temp (!) 97 F (36.1 C) (Temporal)   Resp 18   Ht 5\' 7"  (1.702 m)   Wt 238 lb 15.7 oz (108.4 kg)   SpO2 100%   BMI 37.43 kg/m  Well-developed well-nourished patient in NAD. HEENT reveals PERLA, EOMI, discs not visualized.  Oral cavity is clear. No oral mucosal lesions are identified. Neck is clear without evidence of cervical or supraclavicular adenopathy. Lungs are clear to A&P. Cardiac examination is essentially unremarkable with regular rate and rhythm  without murmur rub or thrill. Abdomen is benign with no organomegaly or masses noted. Motor sensory and DTR levels are equal and symmetric in the upper and lower extremities. Cranial nerves II through XII are grossly intact. Proprioception is intact. No peripheral adenopathy or edema is identified. No motor or sensory levels are noted. Crude visual fields are within normal range.  RADIOLOGY RESULTS: Ultrasound and plain films used for seed implantation  PLAN: Patient was taken to the operating room and general anesthesia was administered. Legs were immobilized in stirrups and patient was positioned in the exact same proportions as original volume study. Patient was prepped and Foley catheter was placed. Ultrasound guidance identified the prostate and recreated the original set up as per treatment planning volume study. 17 needles were placed under ultrasound guidance with PVCs delivered to the prostate volume. After completion of procedure cystoscopy was performed by urology and no evidence of seeds in the bladder were noted. Patient tolerated the procedure extremely well. Initial plain film as doublecheck identified 41 seeds in the prostate. Patient has followup appointment in one month for CT scan for quality assurance will be performed.    Noreene Filbert, MD

## 2019-11-01 ENCOUNTER — Ambulatory Visit: Payer: Medicare Other | Admitting: Radiation Oncology

## 2019-11-06 ENCOUNTER — Other Ambulatory Visit: Payer: Self-pay | Admitting: *Deleted

## 2019-11-06 ENCOUNTER — Other Ambulatory Visit: Payer: Self-pay

## 2019-11-06 ENCOUNTER — Inpatient Hospital Stay: Payer: Medicare Other

## 2019-11-06 ENCOUNTER — Inpatient Hospital Stay: Payer: Medicare Other | Attending: Oncology | Admitting: Oncology

## 2019-11-06 ENCOUNTER — Other Ambulatory Visit: Payer: Medicare Other

## 2019-11-06 ENCOUNTER — Encounter: Payer: Self-pay | Admitting: Oncology

## 2019-11-06 VITALS — BP 164/61 | HR 60 | Temp 96.2°F | Resp 16 | Wt 242.8 lb

## 2019-11-06 DIAGNOSIS — C61 Malignant neoplasm of prostate: Secondary | ICD-10-CM | POA: Diagnosis not present

## 2019-11-06 DIAGNOSIS — M9903 Segmental and somatic dysfunction of lumbar region: Secondary | ICD-10-CM | POA: Diagnosis not present

## 2019-11-06 DIAGNOSIS — M5416 Radiculopathy, lumbar region: Secondary | ICD-10-CM | POA: Diagnosis not present

## 2019-11-06 DIAGNOSIS — I10 Essential (primary) hypertension: Secondary | ICD-10-CM | POA: Insufficient documentation

## 2019-11-06 DIAGNOSIS — C772 Secondary and unspecified malignant neoplasm of intra-abdominal lymph nodes: Secondary | ICD-10-CM | POA: Insufficient documentation

## 2019-11-06 DIAGNOSIS — Z923 Personal history of irradiation: Secondary | ICD-10-CM | POA: Diagnosis not present

## 2019-11-06 DIAGNOSIS — M9905 Segmental and somatic dysfunction of pelvic region: Secondary | ICD-10-CM | POA: Diagnosis not present

## 2019-11-06 DIAGNOSIS — Z87891 Personal history of nicotine dependence: Secondary | ICD-10-CM | POA: Diagnosis not present

## 2019-11-06 DIAGNOSIS — M955 Acquired deformity of pelvis: Secondary | ICD-10-CM | POA: Diagnosis not present

## 2019-11-06 LAB — CBC WITH DIFFERENTIAL/PLATELET
Abs Immature Granulocytes: 0.03 10*3/uL (ref 0.00–0.07)
Basophils Absolute: 0 10*3/uL (ref 0.0–0.1)
Basophils Relative: 0 %
Eosinophils Absolute: 0.1 10*3/uL (ref 0.0–0.5)
Eosinophils Relative: 1 %
HCT: 34.3 % — ABNORMAL LOW (ref 39.0–52.0)
Hemoglobin: 11.1 g/dL — ABNORMAL LOW (ref 13.0–17.0)
Immature Granulocytes: 1 %
Lymphocytes Relative: 8 %
Lymphs Abs: 0.5 10*3/uL — ABNORMAL LOW (ref 0.7–4.0)
MCH: 31.4 pg (ref 26.0–34.0)
MCHC: 32.4 g/dL (ref 30.0–36.0)
MCV: 96.9 fL (ref 80.0–100.0)
Monocytes Absolute: 0.6 10*3/uL (ref 0.1–1.0)
Monocytes Relative: 9 %
Neutro Abs: 5.2 10*3/uL (ref 1.7–7.7)
Neutrophils Relative %: 81 %
Platelets: 177 10*3/uL (ref 150–400)
RBC: 3.54 MIL/uL — ABNORMAL LOW (ref 4.22–5.81)
RDW: 13.9 % (ref 11.5–15.5)
WBC: 6.4 10*3/uL (ref 4.0–10.5)
nRBC: 0 % (ref 0.0–0.2)

## 2019-11-06 LAB — COMPREHENSIVE METABOLIC PANEL
ALT: 28 U/L (ref 0–44)
AST: 23 U/L (ref 15–41)
Albumin: 4.3 g/dL (ref 3.5–5.0)
Alkaline Phosphatase: 50 U/L (ref 38–126)
Anion gap: 8 (ref 5–15)
BUN: 19 mg/dL (ref 8–23)
CO2: 27 mmol/L (ref 22–32)
Calcium: 9.5 mg/dL (ref 8.9–10.3)
Chloride: 106 mmol/L (ref 98–111)
Creatinine, Ser: 1.17 mg/dL (ref 0.61–1.24)
GFR calc Af Amer: >60 mL/min (ref >60)
GFR calc non Af Amer: 56 mL/min — ABNORMAL LOW (ref >60)
Glucose, Bld: 134 mg/dL — ABNORMAL HIGH (ref 70–99)
Potassium: 5.2 mmol/L — ABNORMAL HIGH (ref 3.5–5.1)
Sodium: 141 mmol/L (ref 135–145)
Total Bilirubin: 0.5 mg/dL (ref 0.3–1.2)
Total Protein: 7.4 g/dL (ref 6.5–8.1)

## 2019-11-06 LAB — PSA: Prostatic Specific Antigen: 4.41 ng/mL — ABNORMAL HIGH (ref 0.00–4.00)

## 2019-11-06 NOTE — Progress Notes (Signed)
Patient is here for follow up he mentions his back is hurting but went to his chiropractor this morning.

## 2019-11-07 LAB — TESTOSTERONE: Testosterone: <3 ng/dL — ABNORMAL LOW (ref 264–916)

## 2019-11-09 NOTE — Progress Notes (Signed)
Hematology/Oncology Consult note [facility]  Telephone:([phone] [phone]  Patient Care Team: Owens Loffler, MD as PCP - General Leandrew Koyanagi, MD as Referring Physician (Ophthalmology) Nickie Retort, MD as Consulting Physician (Urology)   Name of the patient: Cesar Powell  [id number]  12-Dec-1930   Date of visit: 11/09/19  Diagnosis-castrate resistant metastatic prostate cancer  Chief complaint/ Reason for visit-routine follow-up of prostate cancer  Heme/Onc history: patient is a 86 year old gentleman with no significant past medical problems. He has a history of prostate cancer that was diagnosed and treated with radiation in 2005. At that point he his PSA was about 0.03 for a long time. Then started on Axiron for low testosterone. PSA eventually went up to as high as 18 2015. At that point he was started on Lupron his PSAdoubling time is around 10 months.Most recently since October 2018 after his PSA went down from 3.6-1.7 and has been gradually increasing to 1.3,1.5and 2 indicating a PSAdoublingtime of 9.9 months.Testosterone levels continue to be suppressed.Patient is tolerating his Lupron well except for some mild fatigue and hot flashes. He is fit and continues to exercise.he has not started oral anti androgen therapy yet.  patient was last seen by me in September 2019 discussed adding oral antiandrogens like apalutamide versus enzalutamide to Lupron in castrate resistant nonmetastatic prostate cancer. After discussing risks and benefits patient did not wish to proceed.Patient stopped lupron in feb 2020 but did restart after 1 year in February 2021.  Interval history-patient tolerated radiation treatment well.  Also has I-125 interstitial implant in place.  He received last dose of eligard on 09/11/2019 and reports tolerating Eligard much better than Lupron without any significant fatigue or hot flashes.  ECOG  PS- 1 Pain scale- 0   Review of systems- Review of Systems  Constitutional: Negative for chills, fever, malaise/fatigue and weight loss.  HENT: Negative for congestion, ear discharge and nosebleeds.   Eyes: Negative for blurred vision.  Respiratory: Negative for cough, hemoptysis, sputum production, shortness of breath and wheezing.   Cardiovascular: Negative for chest pain, palpitations, orthopnea and claudication.  Gastrointestinal: Negative for abdominal pain, blood in stool, constipation, diarrhea, heartburn, melena, nausea and vomiting.  Genitourinary: Negative for dysuria, flank pain, frequency, hematuria and urgency.  Musculoskeletal: Negative for back pain, joint pain and myalgias.  Skin: Negative for rash.  Neurological: Negative for dizziness, tingling, focal weakness, seizures, weakness and headaches.  Endo/Heme/Allergies: Does not bruise/bleed easily.  Psychiatric/Behavioral: Negative for depression and suicidal ideas. The patient does not have insomnia.       No Known Allergies   Past Medical History:  Diagnosis Date  . Discoloration of nail   . H/O prostate cancer    was treated with radiation  . Hypertension   . Local recurrence of prostate cancer ([place]) 06/21/2004   Qualifier: Diagnosis of  By: Fuller Plan CMA (AAMA), [place]    . Shingles   . Sleep apnea 1977   uses C-pap      Past Surgical History:  Procedure Laterality Date  . ABDOMINAL SURGERY     ruptured intestines   . CATARACT EXTRACTION W/PHACO Left 01/29/2016   Procedure: CATARACT EXTRACTION PHACO AND INTRAOCULAR LENS PLACEMENT ([place]) left eye;  Surgeon: Leandrew Koyanagi, MD;  Location: [place];  Service: Ophthalmology;  Laterality: Left;  RESTOR LENS  . CATARACT EXTRACTION W/PHACO Right 10/21/2016   Procedure: CATARACT EXTRACTION PHACO AND INTRAOCULAR LENS PLACEMENT ([facility])  Right restor toric lens;  Surgeon: Nila Nephew  Brasington, MD;  Location: [place];  Service: Ophthalmology;   Laterality: Right;  restor toric lens  . CYSTOSCOPY WITH LITHOLAPAXY N/A 08/14/2016   Procedure: CYSTOSCOPY WITH LITHOLAPAXY;  Surgeon: Nickie Retort, MD;  Location: [facility];  Service: Urology;  Laterality: N/A;  . FEMUR IM NAIL Right 10/08/2014   Procedure: INTRAMEDULLARY (IM) NAIL FEMORAL;  Surgeon: Rod Can, MD;  Location: [place];  Service: Orthopedics;  Laterality: Right;  PER DANIELLE 110 MIN  . HOLMIUM LASER APPLICATION  [id number]   Procedure: HOLMIUM LASER APPLICATION;  Surgeon: Nickie Retort, MD;  Location: [facility];  Service: Urology;;  . OTHER SURGICAL HISTORY  2006   Prostate Radiation Surgery   . RADIOACTIVE SEED IMPLANT N/A 10/30/2019   Procedure: RADIOACTIVE SEED IMPLANT/BRACHYTHERAPY IMPLANT;  Surgeon: Billey Co, MD;  Location: [facility];  Service: Urology;  Laterality: N/A;  41 seeds implanted    Social History   Socioeconomic History  . Marital status: Married    Spouse name: Not on file  . Number of children: Not on file  . Years of education: Not on file  . Highest education level: Not on file  Occupational History  . Occupation: retired  Tobacco Use  . Smoking status: Former Smoker    Packs/day: 1.00    Years: 15.00    Pack years: 15.00    Types: Cigarettes    Quit date: 09/14/1965    Years since quitting: 54.1  . Smokeless tobacco: Never Used  Substance and Sexual Activity  . Alcohol use: Not Currently    Comment: occassiona;   [name] Drug use: No  . Sexual activity: Not on file  Other Topics Concern  . Not on file  Social History Narrative  . Not on file   Social Determinants of Health   Financial Resource Strain: Low Risk   . Difficulty of Paying Living Expenses: Not hard at all  Food Insecurity: No Food Insecurity  . Worried About Charity fundraiser in the Last Year: Never true  . Ran Out of Food in the Last Year: Never true  Transportation Needs: No Transportation Needs  . Lack of Transportation (Medical): No  . Lack of  Transportation (Non-Medical): No  Physical Activity: Sufficiently Active  . Days of Exercise per Week: 7 days  . Minutes of Exercise per Session: 30 min  Stress: No Stress Concern Present  . Feeling of Stress : Not at all  Social Connections:   . Frequency of Communication with Friends and Family:   . Frequency of Social Gatherings with Friends and Family:   . Attends Religious Services:   . Active Member of Clubs or Organizations:   . Attends Archivist Meetings:   [name] Marital Status:   Intimate Partner Violence: Not At Risk  . Fear of Current or Ex-Partner: No  . Emotionally Abused: No  . Physically Abused: No  . Sexually Abused: No    Family History  Problem Relation Age of Onset  . Heart disease Brother   . Heart attack Brother   . Lung cancer Brother   . Prostate cancer Neg Hx   . Bladder Cancer Neg Hx   . Kidney cancer Neg Hx      Current Outpatient Medications:  .  ascorbic acid (RA VITAMIN C) 500 MG tablet, Take 1,000 mg by mouth daily., Disp: , Rfl:  .  Cholecalciferol (VITAMIN D3) 50 MCG (2000 UT) TABS, Take 2,000 Units by mouth daily., Disp: , Rfl:  .  Flaxseed, Linseed, (FLAXSEED OIL PO), Take 1 capsule by mouth daily., Disp: , Rfl:  .  Misc Natural Products (IMMUNE FORMULA PO), Take 2 capsules by mouth daily. Nutriferon by [employer], Disp: , Rfl:  .  Misc Natural Products (JOINT HEALTH PO), Take 2 tablets by mouth daily., Disp: , Rfl:  .  Multiple Vitamin (MULTIVITAMIN WITH MINERALS) TABS tablet, Take 2 tablets by mouth daily. Vita-Lea, Disp: , Rfl:  .  Multiple Vitamins-Minerals (ZINC PO), Take 1 tablet by mouth daily., Disp: , Rfl:  .  Nutritional Supplements (IMMUNE ENHANCE) TABS, Take 1 tablet by mouth daily., Disp: , Rfl:  .  omega-3 fish oil (MAXEPA) 1000 MG CAPS capsule, Take 1 capsule by mouth., Disp: , Rfl:  .  Probiotic Product (PROBIOTIC PO), Take 1 capsule by mouth daily., Disp: , Rfl:  .  RESVERATROL PO, Take 2 capsules by mouth daily. Vivix,  Disp: , Rfl:  .  tamsulosin (FLOMAX) 0.4 MG CAPS capsule, Take 2 capsules (0.8 mg total) by mouth daily. (Patient taking differently: Take 0.4 mg by mouth in the morning and at bedtime. ), Disp: 60 capsule, Rfl: 11 .  TURMERIC PO, Take 1 capsule by mouth daily., Disp: , Rfl:   Physical exam:  Vitals:   11/06/19 1012  BP: (!) 164/61  Pulse: 60  Resp: 16  Temp: (!) 96.2 F (35.7 C)  TempSrc: Tympanic  SpO2: 99%  Weight: 242 lb 12.8 oz (110.1 kg)   Physical Exam Cardiovascular:     Rate and Rhythm: Normal rate and regular rhythm.     Heart sounds: Normal heart sounds.  Pulmonary:     Effort: Pulmonary effort is normal.     Breath sounds: Normal breath sounds.  Abdominal:     General: Bowel sounds are normal.     Palpations: Abdomen is soft.  Skin:    General: Skin is warm and dry.  Neurological:     Mental Status: He is alert and oriented to person, place, and time.      CMP Latest Ref Rng & Units 11/06/2019  Glucose 70 - 99 mg/dL 134(H)  BUN 8 - 23 mg/dL 19  Creatinine 0.61 - 1.24 mg/dL 1.17  Sodium 135 - 145 mmol/L 141  Potassium 3.5 - 5.1 mmol/L 5.2(H)  Chloride 98 - 111 mmol/L 106  CO2 22 - 32 mmol/L 27  Calcium 8.9 - 10.3 mg/dL 9.5  Total Protein 6.5 - 8.1 g/dL 7.4  Total Bilirubin 0.3 - 1.2 mg/dL 0.5  Alkaline Phos 38 - 126 U/L 50  AST 15 - 41 U/L 23  ALT 0 - 44 U/L 28   CBC Latest Ref Rng & Units 11/06/2019  WBC 4.0 - 10.5 K/uL 6.4  Hemoglobin 13.0 - 17.0 g/dL 11.1(L)  Hematocrit 39.0 - 52.0 % 34.3(L)  Platelets 150 - 400 K/uL 177    No images are attached to the encounter.  DG OR UROLOGY CYSTO IMAGE ([facility] ONLY)  Result Date: 10/30/2019 There is no interpretation for this exam.  This order is for images obtained during a surgical procedure.  Please See "Surgeries" Tab for more information regarding the procedure.     Assessment and plan- Patient is a 85 y.o. male with castrate resistant metastatic prostate cancer to lymph nodes.  He is here for routine  follow-up    Patient PSA doubling time is roughly been around 10 months.  Patient stopped taking his Lupron in February 2020 his PSA went up from 2.9 to 6.7 in January 2021.  He then received Eligard in February 2021 and his PSA is down to 4.4.  Patient has received radiation therapy to his intra-abdominal retroperitoneal nodes and also has I-125 interstitial implant in place.  He is doing very well for his age and presently reports tolerating Eligard without any side effects which I will plan to continue.  He will be due for his next dose of Eligard in August 2021.  If patient's PSA continues to rise despite Eligard, I will discuss further treatment with him.  Patient has been hesitant to try any additional antiandrogen therapies due to risk of side effects  Will discuss bisphosphonate on a yearly basis given his baseline osteopenia at his next visit  CBC with differential, CMP and PSA in 2 months in 4 months and I will see him in 4 months for his next dose of Eligard   Visit Diagnosis 1. Prostate cancer [facility])      Dr. Randa Evens, MD, MPH [facility] [id number] 11/09/2019 5:23 AM

## 2019-11-13 DIAGNOSIS — M9905 Segmental and somatic dysfunction of pelvic region: Secondary | ICD-10-CM | POA: Diagnosis not present

## 2019-11-13 DIAGNOSIS — M955 Acquired deformity of pelvis: Secondary | ICD-10-CM | POA: Diagnosis not present

## 2019-11-13 DIAGNOSIS — M5416 Radiculopathy, lumbar region: Secondary | ICD-10-CM | POA: Diagnosis not present

## 2019-11-13 DIAGNOSIS — M9903 Segmental and somatic dysfunction of lumbar region: Secondary | ICD-10-CM | POA: Diagnosis not present

## 2019-11-21 DIAGNOSIS — M5416 Radiculopathy, lumbar region: Secondary | ICD-10-CM | POA: Diagnosis not present

## 2019-11-21 DIAGNOSIS — M955 Acquired deformity of pelvis: Secondary | ICD-10-CM | POA: Diagnosis not present

## 2019-11-21 DIAGNOSIS — M9905 Segmental and somatic dysfunction of pelvic region: Secondary | ICD-10-CM | POA: Diagnosis not present

## 2019-11-21 DIAGNOSIS — M9903 Segmental and somatic dysfunction of lumbar region: Secondary | ICD-10-CM | POA: Diagnosis not present

## 2019-11-27 ENCOUNTER — Ambulatory Visit: Payer: Medicare Other

## 2019-11-27 ENCOUNTER — Other Ambulatory Visit: Payer: Self-pay

## 2019-11-27 ENCOUNTER — Encounter: Payer: Self-pay | Admitting: Radiation Oncology

## 2019-11-28 ENCOUNTER — Ambulatory Visit: Payer: Medicare Other

## 2019-11-28 ENCOUNTER — Ambulatory Visit
Admission: RE | Admit: 2019-11-28 | Discharge: 2019-11-28 | Disposition: A | Discharge status: Home / Self Care | Payer: Medicare Other | Source: Ambulatory Visit | Attending: Radiation Oncology | Admitting: Radiation Oncology

## 2019-11-28 DIAGNOSIS — C61 Malignant neoplasm of prostate: Secondary | ICD-10-CM | POA: Diagnosis not present

## 2019-11-28 DIAGNOSIS — Z923 Personal history of irradiation: Secondary | ICD-10-CM | POA: Diagnosis not present

## 2019-11-28 NOTE — Progress Notes (Signed)
Radiation Oncology Follow up Note  Name: Cesar Powell   Date:   11/28/2019 MRN:  [id number] DOB: 07/23/1931    This 86 y.o. male presents to the clinic today for 1 month follow-up status post I-125 interstitial implant for boost in a patient with known locally advanced adenocarcinoma the prostate treated with both external beam and I-125 interstitial implant for boost.  REFERRING PROVIDER: Owens Loffler, MD  HPI: Patient is a 86 year old male in excellent general condition who had external beam radiation therapy to his prostate for adenocarcinoma back in 2005.  He had biochemical failure and involvement of distal retroperitoneal bilateral common iliac and right external iliac lymph nodes concerning for nodal metastasis on Axumin PET/CT.  He also increased uptake in his prostate gland.  We started him back on androgen deprivation therapy with Eligard treated his pelvic nodes with IMRT treatment high dosing his involved lymph nodes.  He then underwent I-125 interstitial implant for boost.  Seen at 1 month he is doing fairly well he is developed some slight urinary incontinence and some urgency.  Specifically denies to diarrhea fatigue.  He has nocturia x2-3.  COMPLICATIONS OF TREATMENT: none  FOLLOW UP COMPLIANCE: keeps appointments   PHYSICAL EXAM:  BP (!) (P) 170/65   Pulse (P) 63   Temp (!) (P) 97.5 F (36.4 C) (Tympanic)   Resp (P) 18   Wt (P) 243 lb 3.2 oz (110.3 kg)   BMI (P) 38.09 kg/m  Well-developed well-nourished patient in NAD. HEENT reveals PERLA, EOMI, discs not visualized.  Oral cavity is clear. No oral mucosal lesions are identified. Neck is clear without evidence of cervical or supraclavicular adenopathy. Lungs are clear to A&P. Cardiac examination is essentially unremarkable with regular rate and rhythm without murmur rub or thrill. Abdomen is benign with no organomegaly or masses noted. Motor sensory and DTR levels are equal and symmetric in the upper and lower  extremities. Cranial nerves II through XII are grossly intact. Proprioception is intact. No peripheral adenopathy or edema is identified. No motor or sensory levels are noted. Crude visual fields are within normal range.  RADIOLOGY RESULTS: CT scan reviewed for quality assurance showing excellent placement of his prostate sources  PLAN: Present time patient is doing well with low side effect profile he knows he is still radioactive for another month.  I have given him instructions for Kegel exercises.  He also will be seeing urology in follow-up with Dr. Michiel Sites.  He is also seeing Dr. Janese Banks as planned for another Eligard treatment in August.  I have asked to see him back in early July after he had his first PSA.  Patient knows to call sooner with any concerns.  I would like to take this opportunity to thank you for allowing me to participate in the care of your patient.Noreene Filbert, MD

## 2019-12-04 DIAGNOSIS — M9905 Segmental and somatic dysfunction of pelvic region: Secondary | ICD-10-CM | POA: Diagnosis not present

## 2019-12-04 DIAGNOSIS — M5416 Radiculopathy, lumbar region: Secondary | ICD-10-CM | POA: Diagnosis not present

## 2019-12-04 DIAGNOSIS — M955 Acquired deformity of pelvis: Secondary | ICD-10-CM | POA: Diagnosis not present

## 2019-12-04 DIAGNOSIS — M9903 Segmental and somatic dysfunction of lumbar region: Secondary | ICD-10-CM | POA: Diagnosis not present

## 2019-12-05 ENCOUNTER — Telehealth: Payer: Self-pay | Admitting: *Deleted

## 2019-12-05 NOTE — Telephone Encounter (Signed)
Patient called in today and states he is going out of town for three day. He thinks he has a kidney infection. I offered him an appt to see Larene Beach today . He declined. We have not got a ua on him in years.  He said he would figure something out and hang up on me.

## 2019-12-18 ENCOUNTER — Ambulatory Visit: Payer: Medicare Other | Admitting: Radiation Oncology

## 2019-12-18 ENCOUNTER — Ambulatory Visit: Payer: Medicare Other

## 2019-12-19 DIAGNOSIS — M955 Acquired deformity of pelvis: Secondary | ICD-10-CM | POA: Diagnosis not present

## 2019-12-19 DIAGNOSIS — M9903 Segmental and somatic dysfunction of lumbar region: Secondary | ICD-10-CM | POA: Diagnosis not present

## 2019-12-19 DIAGNOSIS — M5416 Radiculopathy, lumbar region: Secondary | ICD-10-CM | POA: Diagnosis not present

## 2019-12-19 DIAGNOSIS — M9905 Segmental and somatic dysfunction of pelvic region: Secondary | ICD-10-CM | POA: Diagnosis not present

## 2019-12-29 ENCOUNTER — Encounter: Payer: Self-pay | Admitting: Urology

## 2020-01-05 ENCOUNTER — Other Ambulatory Visit: Payer: Self-pay

## 2020-01-05 ENCOUNTER — Inpatient Hospital Stay: Payer: Medicare Other | Attending: Oncology

## 2020-01-05 DIAGNOSIS — C61 Malignant neoplasm of prostate: Secondary | ICD-10-CM | POA: Diagnosis present

## 2020-01-05 LAB — CBC WITH DIFFERENTIAL/PLATELET
Abs Immature Granulocytes: 0.03 10*3/uL (ref 0.00–0.07)
Basophils Absolute: 0 10*3/uL (ref 0.0–0.1)
Basophils Relative: 0 %
Eosinophils Absolute: 0.2 10*3/uL (ref 0.0–0.5)
Eosinophils Relative: 3 %
HCT: 33 % — ABNORMAL LOW (ref 39.0–52.0)
Hemoglobin: 11 g/dL — ABNORMAL LOW (ref 13.0–17.0)
Immature Granulocytes: 1 %
Lymphocytes Relative: 9 %
Lymphs Abs: 0.5 10*3/uL — ABNORMAL LOW (ref 0.7–4.0)
MCH: 32.2 pg (ref 26.0–34.0)
MCHC: 33.3 g/dL (ref 30.0–36.0)
MCV: 96.5 fL (ref 80.0–100.0)
Monocytes Absolute: 0.7 10*3/uL (ref 0.1–1.0)
Monocytes Relative: 12 %
Neutro Abs: 4.7 10*3/uL (ref 1.7–7.7)
Neutrophils Relative %: 75 %
Platelets: 199 10*3/uL (ref 150–400)
RBC: 3.42 MIL/uL — ABNORMAL LOW (ref 4.22–5.81)
RDW: 13.4 % (ref 11.5–15.5)
WBC: 6.2 10*3/uL (ref 4.0–10.5)
nRBC: 0 % (ref 0.0–0.2)

## 2020-01-05 LAB — COMPREHENSIVE METABOLIC PANEL
ALT: 21 U/L (ref 0–44)
AST: 19 U/L (ref 15–41)
Albumin: 4.2 g/dL (ref 3.5–5.0)
Alkaline Phosphatase: 49 U/L (ref 38–126)
Anion gap: 8 (ref 5–15)
BUN: 25 mg/dL — ABNORMAL HIGH (ref 8–23)
CO2: 26 mmol/L (ref 22–32)
Calcium: 9.3 mg/dL (ref 8.9–10.3)
Chloride: 104 mmol/L (ref 98–111)
Creatinine, Ser: 0.93 mg/dL (ref 0.61–1.24)
GFR calc Af Amer: >60 mL/min (ref >60)
GFR calc non Af Amer: >60 mL/min (ref >60)
Glucose, Bld: 101 mg/dL — ABNORMAL HIGH (ref 70–99)
Potassium: 4.7 mmol/L (ref 3.5–5.1)
Sodium: 138 mmol/L (ref 135–145)
Total Bilirubin: 0.6 mg/dL (ref 0.3–1.2)
Total Protein: 7.1 g/dL (ref 6.5–8.1)

## 2020-01-05 LAB — PSA: Prostatic Specific Antigen: 1.87 ng/mL (ref 0.00–4.00)

## 2020-01-10 ENCOUNTER — Other Ambulatory Visit: Payer: Self-pay | Admitting: Urology

## 2020-01-10 DIAGNOSIS — N3941 Urge incontinence: Secondary | ICD-10-CM

## 2020-01-10 DIAGNOSIS — N401 Enlarged prostate with lower urinary tract symptoms: Secondary | ICD-10-CM

## 2020-01-10 DIAGNOSIS — C61 Malignant neoplasm of prostate: Secondary | ICD-10-CM

## 2020-01-25 ENCOUNTER — Ambulatory Visit (INDEPENDENT_AMBULATORY_CARE_PROVIDER_SITE_OTHER): Payer: Medicare Other | Admitting: Urology

## 2020-01-25 ENCOUNTER — Ambulatory Visit: Payer: Medicare Other | Admitting: Urology

## 2020-01-25 ENCOUNTER — Encounter: Payer: Self-pay | Admitting: Urology

## 2020-01-25 ENCOUNTER — Other Ambulatory Visit: Payer: Self-pay

## 2020-01-25 VITALS — BP 178/71 | HR 74 | Ht 68.0 in | Wt 250.0 lb

## 2020-01-25 DIAGNOSIS — C61 Malignant neoplasm of prostate: Secondary | ICD-10-CM

## 2020-01-25 DIAGNOSIS — N3281 Overactive bladder: Secondary | ICD-10-CM

## 2020-01-25 NOTE — Progress Notes (Signed)
   01/25/2020 4:47 PM   Cesar Powell [birth year] [id number]  Reason for visit: Follow up prostate cancer  HPI: I saw Cesar Powell back in urology clinic for follow-up of prostate cancer.  He is a very complex 86 year old male with a long history of prostate cancer.  To briefly summarize, he was originally treated with radiation for prostate cancer in 2005 at [facility](original Gleason score and PSA unavailable to me), his PSA reportedly dropped to 0 after treatment, however then PSA started to rise and he was started on Lupron.  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  He refused further ADT secondary to the side effects.  He was discussed at tumor board and imaging in December 2020 with a PET scan suggested retroperitoneal, bilateral common iliac, and right external iliac lymph node involvement, as well as prostatic involvement.  He ultimately opted for external beam radiation to the lymph nodes, and brachytherapy with Dr. Donella Powell.  He also restarted Eligard with Dr. Janese Powell in February 2021.  He remains adamant that he does not want any further ADT, which is certainly understandable with his side effects.  PSA trend is difficult to interpret with his history of intermittent ADT, and recent radiation treatment, however most recent PSA was 1.87 on 01/05/2020.  He remains adamant he does not want to undergo any further treatments for prostate cancer including ADT or other antiandrogen therapies, and would like to purely monitor the PSA at this time.  With his age and his significant side effects from ADT, this is not unreasonable.  Hopefully, the radiation will have significantly decreased his PSA doubling time, and we can safely monitor the PSA alone moving forward.  Regarding his urinary symptoms, he does have some urinary frequency, urgency, and occasional urge incontinence.  He is opposed to taking any medications for this, and is working  on Kegel exercises which he feels have improved his urination.  He does drink tea and wine during the day and in the evenings.  We discussed behavioral strategies at length to try to improve his urinary symptoms.  RTC 6 months with PSA prior  Cesar Co, MD  [facility] [address] [place], [place] [address] [phone]

## 2020-02-06 ENCOUNTER — Other Ambulatory Visit: Payer: Medicare Other

## 2020-02-06 ENCOUNTER — Ambulatory Visit: Payer: Medicare Other | Admitting: Oncology

## 2020-02-09 ENCOUNTER — Other Ambulatory Visit: Payer: Self-pay

## 2020-02-09 ENCOUNTER — Encounter: Payer: Self-pay | Admitting: Radiation Oncology

## 2020-02-09 ENCOUNTER — Ambulatory Visit
Admission: RE | Admit: 2020-02-09 | Discharge: 2020-02-09 | Disposition: A | Discharge status: Home / Self Care | Payer: Medicare Other | Source: Ambulatory Visit | Attending: Radiation Oncology | Admitting: Radiation Oncology

## 2020-02-09 VITALS — BP 157/62 | HR 64 | Temp 97.2°F | Resp 20 | Wt 248.0 lb

## 2020-02-09 DIAGNOSIS — Z923 Personal history of irradiation: Secondary | ICD-10-CM | POA: Diagnosis not present

## 2020-02-09 DIAGNOSIS — C61 Malignant neoplasm of prostate: Secondary | ICD-10-CM | POA: Diagnosis not present

## 2020-02-09 NOTE — Progress Notes (Signed)
Radiation Oncology Follow up Note  Name: Cesar Powell   Date:   02/09/2020 MRN:  [id number] DOB: 12/21/1930    This 86 y.o. male presents to the clinic today for 85-month follow-up status post I-125 interstitial implant for boost in patient with known locally advanced adenocarcinoma the prostate treated with both external beam radiation therapy and I-125 interstitial implant for boost..  REFERRING PROVIDER: Owens Loffler, MD  HPI: Patient is an interesting 86 year old male excellent general condition who had external beam radiation therapy to his prostate back in 2005 he had biochemical failure and involvement of distal retroperitoneal bilateral common iliac and right external iliac lymph nodes concerning for nodal metastasis on PET/CT.  He also increased uptake in his prostate gland.  We treated him with Eligard as well as external beam radiation therapy to his prostate and pelvic nodes boosting his prostate with I-125 interstitial implant.  He is seen today 4 months out and is doing well he does have some urgency and frequency of urination nothing that significant from prior to treatment.  Bowels are functioning well no specific diarrhea.  He feels well.[name]  His most recent PSA is 1.87 down from 6.76 months ago.  COMPLICATIONS OF TREATMENT: none  FOLLOW UP COMPLIANCE: keeps appointments   PHYSICAL EXAM:  BP (!) 157/62 (BP Location: Left Arm, Patient Position: Sitting, Cuff Size: Large)   Pulse 64   Temp (!) 97.2 F (36.2 C) (Tympanic)   Resp 20   Wt 248 lb (112.5 kg)   BMI 37.71 kg/m  Well-developed well-nourished patient in NAD. HEENT reveals PERLA, EOMI, discs not visualized.  Oral cavity is clear. No oral mucosal lesions are identified. Neck is clear without evidence of cervical or supraclavicular adenopathy. Lungs are clear to A&P. Cardiac examination is essentially unremarkable with regular rate and rhythm without murmur rub or thrill. Abdomen is benign with no organomegaly or  masses noted. Motor sensory and DTR levels are equal and symmetric in the upper and lower extremities. Cranial nerves II through XII are grossly intact. Proprioception is intact. No peripheral adenopathy or edema is identified. No motor or sensory levels are noted. Crude visual fields are within normal range.  RADIOLOGY RESULTS: No current films to review  PLAN: Present time patient is doing well.  His most recent PSA is from a month ago.  He continues follow-up care with Dr. Jeb Levering in urology as well as Dr. Janese Banks.  He states he will not take any more Eligard since it causes significant side effects including weight gain and fatigue.  I have asked to see him back in 6 months for follow-up.  Patient knows to call with any concerns.  I would like to take this opportunity to thank you for allowing me to participate in the care of your patient.Noreene Filbert, MD

## 2020-02-13 DIAGNOSIS — M955 Acquired deformity of pelvis: Secondary | ICD-10-CM | POA: Diagnosis not present

## 2020-02-13 DIAGNOSIS — M9905 Segmental and somatic dysfunction of pelvic region: Secondary | ICD-10-CM | POA: Diagnosis not present

## 2020-02-13 DIAGNOSIS — M9903 Segmental and somatic dysfunction of lumbar region: Secondary | ICD-10-CM | POA: Diagnosis not present

## 2020-02-13 DIAGNOSIS — M5416 Radiculopathy, lumbar region: Secondary | ICD-10-CM | POA: Diagnosis not present

## 2020-03-12 DIAGNOSIS — M62830 Muscle spasm of back: Secondary | ICD-10-CM | POA: Diagnosis not present

## 2020-03-12 DIAGNOSIS — M5416 Radiculopathy, lumbar region: Secondary | ICD-10-CM | POA: Diagnosis not present

## 2020-03-12 DIAGNOSIS — M9905 Segmental and somatic dysfunction of pelvic region: Secondary | ICD-10-CM | POA: Diagnosis not present

## 2020-03-12 DIAGNOSIS — M9903 Segmental and somatic dysfunction of lumbar region: Secondary | ICD-10-CM | POA: Diagnosis not present

## 2020-04-01 ENCOUNTER — Inpatient Hospital Stay (HOSPITAL_BASED_OUTPATIENT_CLINIC_OR_DEPARTMENT_OTHER): Payer: Medicare Other | Admitting: Oncology

## 2020-04-01 ENCOUNTER — Inpatient Hospital Stay: Payer: Medicare Other | Attending: Oncology

## 2020-04-01 ENCOUNTER — Inpatient Hospital Stay: Payer: Medicare Other

## 2020-04-01 ENCOUNTER — Encounter: Payer: Self-pay | Admitting: Oncology

## 2020-04-01 ENCOUNTER — Other Ambulatory Visit: Payer: Self-pay

## 2020-04-01 VITALS — BP 154/64 | HR 59 | Temp 96.6°F | Wt 246.7 lb

## 2020-04-01 DIAGNOSIS — C61 Malignant neoplasm of prostate: Secondary | ICD-10-CM

## 2020-04-01 DIAGNOSIS — Z87891 Personal history of nicotine dependence: Secondary | ICD-10-CM | POA: Diagnosis not present

## 2020-04-01 DIAGNOSIS — C779 Secondary and unspecified malignant neoplasm of lymph node, unspecified: Secondary | ICD-10-CM | POA: Diagnosis not present

## 2020-04-01 DIAGNOSIS — I10 Essential (primary) hypertension: Secondary | ICD-10-CM | POA: Insufficient documentation

## 2020-04-01 LAB — CBC WITH DIFFERENTIAL/PLATELET
Abs Immature Granulocytes: 0.04 10*3/uL (ref 0.00–0.07)
Basophils Absolute: 0 10*3/uL (ref 0.0–0.1)
Basophils Relative: 1 %
Eosinophils Absolute: 0.2 10*3/uL (ref 0.0–0.5)
Eosinophils Relative: 4 %
HCT: 32.9 % — ABNORMAL LOW (ref 39.0–52.0)
Hemoglobin: 11.1 g/dL — ABNORMAL LOW (ref 13.0–17.0)
Immature Granulocytes: 1 %
Lymphocytes Relative: 13 %
Lymphs Abs: 0.7 10*3/uL (ref 0.7–4.0)
MCH: 31.8 pg (ref 26.0–34.0)
MCHC: 33.7 g/dL (ref 30.0–36.0)
MCV: 94.3 fL (ref 80.0–100.0)
Monocytes Absolute: 0.4 10*3/uL (ref 0.1–1.0)
Monocytes Relative: 7 %
Neutro Abs: 4 10*3/uL (ref 1.7–7.7)
Neutrophils Relative %: 74 %
Platelets: 189 10*3/uL (ref 150–400)
RBC: 3.49 MIL/uL — ABNORMAL LOW (ref 4.22–5.81)
RDW: 13.9 % (ref 11.5–15.5)
WBC: 5.4 10*3/uL (ref 4.0–10.5)
nRBC: 0 % (ref 0.0–0.2)

## 2020-04-01 LAB — COMPREHENSIVE METABOLIC PANEL
ALT: 18 U/L (ref 0–44)
AST: 20 U/L (ref 15–41)
Albumin: 4 g/dL (ref 3.5–5.0)
Alkaline Phosphatase: 53 U/L (ref 38–126)
Anion gap: 9 (ref 5–15)
BUN: 25 mg/dL — ABNORMAL HIGH (ref 8–23)
CO2: 24 mmol/L (ref 22–32)
Calcium: 8.5 mg/dL — ABNORMAL LOW (ref 8.9–10.3)
Chloride: 107 mmol/L (ref 98–111)
Creatinine, Ser: 1.12 mg/dL (ref 0.61–1.24)
GFR calc Af Amer: >60 mL/min (ref >60)
GFR calc non Af Amer: 59 mL/min — ABNORMAL LOW (ref >60)
Glucose, Bld: 163 mg/dL — ABNORMAL HIGH (ref 70–99)
Potassium: 4.4 mmol/L (ref 3.5–5.1)
Sodium: 140 mmol/L (ref 135–145)
Total Bilirubin: 0.4 mg/dL (ref 0.3–1.2)
Total Protein: 6.9 g/dL (ref 6.5–8.1)

## 2020-04-01 LAB — PSA: Prostatic Specific Antigen: 2.14 ng/mL (ref 0.00–4.00)

## 2020-04-01 NOTE — Progress Notes (Signed)
Hematology/Oncology Consult note [facility]  [phone] [phone]  Patient Care Team: Owens Loffler, MD as PCP - General Leandrew Koyanagi, MD as Referring Physician (Ophthalmology) Nickie Retort, MD as Consulting Physician (Urology) Noreene Filbert, MD as Radiation Oncologist (Radiation Oncology)   Name of the patient: Cesar Powell  [id number]  September 25, 1930   Date of visit: 04/01/20  Diagnosis- castrate resistant metastatic prostate cancer  Chief complaint/ Reason for visit-routine follow-up of prostate cancer  Heme/Onc history: patient is a 86 year old gentleman with no significant past medical problems. He has a history of prostate cancer that was diagnosed and treated with radiation in 2005. At that point he his PSA was about 0.03 for a long time. Then started on Axiron for low testosterone. PSA eventually went up to as high as 18 2015. At that point he was started on Lupron his PSAdoubling time is around 10 months.Most recently since October 2018 after his PSA went down from 3.6-1.7 and has been gradually increasing to 1.3,1.5and 2 indicating a PSAdoublingtime of 9.9 months.Testosterone levels continue to be suppressed.He has not started oral anti androgen therapy yet.  patient was last seen by me in September 2019 discussed adding oral antiandrogens like apalutamide versus enzalutamide to Lupron in castrate resistant nonmetastatic prostate cancer. After discussing risks and benefits patient did not wish to proceed.Patient stopped lupron in feb 2020 but did restart after 1 year in February 2021.  Patient received IMRT for his pelvic lymph nodes.  Interval history-presently patient does not want to continue ADT.  He reports Eligard gives him bilateral leg pain as well as low back pain and reduces his exercise tolerance which she does not want.  Overall he feels well and continues to remain active for his  age.  ECOG PS- 1 Pain scale- 0   Review of systems- Review of Systems  Constitutional: Positive for malaise/fatigue. Negative for chills, fever and weight loss.  HENT: Negative for congestion, ear discharge and nosebleeds.   Eyes: Negative for blurred vision.  Respiratory: Negative for cough, hemoptysis, sputum production, shortness of breath and wheezing.   Cardiovascular: Negative for chest pain, palpitations, orthopnea and claudication.  Gastrointestinal: Negative for abdominal pain, blood in stool, constipation, diarrhea, heartburn, melena, nausea and vomiting.  Genitourinary: Negative for dysuria, flank pain, frequency, hematuria and urgency.  Musculoskeletal: Positive for back pain. Negative for joint pain and myalgias.  Skin: Negative for rash.  Neurological: Negative for dizziness, tingling, focal weakness, seizures, weakness and headaches.  Endo/Heme/Allergies: Does not bruise/bleed easily.  Psychiatric/Behavioral: Negative for depression and suicidal ideas. The patient does not have insomnia.        No Known Allergies   Past Medical History:  Diagnosis Date  . Discoloration of nail   . H/O prostate cancer    was treated with radiation  . Hypertension   . Local recurrence of prostate cancer ([place]) 06/21/2004   Qualifier: Diagnosis of  By: Fuller Plan CMA (AAMA), Lugene    . Shingles   . Sleep apnea 1977   uses C-pap      Past Surgical History:  Procedure Laterality Date  . ABDOMINAL SURGERY     ruptured intestines   . CATARACT EXTRACTION W/PHACO Left 01/29/2016   Procedure: CATARACT EXTRACTION PHACO AND INTRAOCULAR LENS PLACEMENT ([place]) left eye;  Surgeon: Leandrew Koyanagi, MD;  Location: [place];  Service: Ophthalmology;  Laterality: Left;  RESTOR LENS  . CATARACT EXTRACTION W/PHACO Right 10/21/2016   Procedure: CATARACT EXTRACTION PHACO AND  INTRAOCULAR LENS PLACEMENT ([facility])  Right restor toric lens;  Surgeon: Leandrew Koyanagi, MD;  Location: [place];  Service: Ophthalmology;  Laterality: Right;  restor toric lens  . CYSTOSCOPY WITH LITHOLAPAXY N/A 08/14/2016   Procedure: CYSTOSCOPY WITH LITHOLAPAXY;  Surgeon: Nickie Retort, MD;  Location: [facility];  Service: Urology;  Laterality: N/A;  . FEMUR IM NAIL Right 10/08/2014   Procedure: INTRAMEDULLARY (IM) NAIL FEMORAL;  Surgeon: Rod Can, MD;  [facility] [place];  Service: Orthopedics;  Laterality: Right;  PER DANIELLE 110 MIN  . HOLMIUM LASER APPLICATION  0/16/0109   Procedure: HOLMIUM LASER APPLICATION;  Surgeon: Nickie Retort, MD;  Location: [facility];  Service: Urology;;  . OTHER SURGICAL HISTORY  2006   Prostate Radiation Surgery   . RADIOACTIVE SEED IMPLANT N/A 10/30/2019   Procedure: RADIOACTIVE SEED IMPLANT/BRACHYTHERAPY IMPLANT;  Surgeon: Billey Co, MD;  Location: [facility];  Service: Urology;  Laterality: N/A;  41 seeds implanted    Social History   Socioeconomic History  . Marital status: Married    Spouse name: Not on file  . Number of children: Not on file  . Years of education: Not on file  . Highest education level: Not on file  Occupational History  . Occupation: retired  Tobacco Use  . Smoking status: Former Smoker    Packs/day: 1.00    Years: 15.00    Pack years: 15.00    Types: Cigarettes    Quit date: 09/14/1965    Years since quitting: 54.5  . Smokeless tobacco: Never Used  Vaping Use  . Vaping Use: Never used  Substance and Sexual Activity  . Alcohol use: Not Currently    Comment: occassiona;   [name] Drug use: No  . Sexual activity: Yes    Birth control/protection: None  Other Topics Concern  . Not on file  Social History Narrative  . Not on file   Social Determinants of Health   Financial Resource Strain: Low Risk   . Difficulty of Paying Living Expenses: Not hard at all  Food Insecurity: No Food Insecurity  . Worried About Charity fundraiser in the Last Year: Never true  . Ran Out of Food in the Last Year: Never true   Transportation Needs: No Transportation Needs  . Lack of Transportation (Medical): No  . Lack of Transportation (Non-Medical): No  Physical Activity: Sufficiently Active  . Days of Exercise per Week: 7 days  . Minutes of Exercise per Session: 30 min  Stress: No Stress Concern Present  . Feeling of Stress : Not at all  Social Connections:   . Frequency of Communication with Friends and Family: Not on file  . Frequency of Social Gatherings with Friends and Family: Not on file  . Attends Religious Services: Not on file  . Active Member of Clubs or Organizations: Not on file  . Attends Archivist Meetings: Not on file  . Marital Status: Not on file  Intimate Partner Violence: Not At Risk  . Fear of Current or Ex-Partner: No  . Emotionally Abused: No  . Physically Abused: No  . Sexually Abused: No    Family History  Problem Relation Age of Onset  . Heart disease Brother   . Heart attack Brother   . Lung cancer Brother   . Prostate cancer Neg Hx   . Bladder Cancer Neg Hx   . Kidney cancer Neg Hx      Current Outpatient Medications:  .  ascorbic  acid (RA VITAMIN C) 500 MG tablet, Take 1,000 mg by mouth daily., Disp: , Rfl:  .  Cholecalciferol (VITAMIN D3) 50 MCG (2000 UT) TABS, Take 2,000 Units by mouth daily., Disp: , Rfl:  .  Flaxseed, Linseed, (FLAXSEED OIL PO), Take 1 capsule by mouth daily., Disp: , Rfl:  .  Misc Natural Products (IMMUNE FORMULA PO), Take 2 capsules by mouth daily. Nutriferon by [employer], Disp: , Rfl:  .  Misc Natural Products (JOINT HEALTH PO), Take 2 tablets by mouth daily., Disp: , Rfl:  .  Multiple Vitamin (MULTIVITAMIN WITH MINERALS) TABS tablet, Take 2 tablets by mouth daily. Vita-Lea, Disp: , Rfl:  .  Multiple Vitamins-Minerals (ZINC PO), Take 1 tablet by mouth daily., Disp: , Rfl:  .  Nutritional Supplements (IMMUNE ENHANCE) TABS, Take 1 tablet by mouth daily., Disp: , Rfl:  .  omega-3 fish oil (MAXEPA) 1000 MG CAPS capsule, Take 1 capsule  by mouth., Disp: , Rfl:  .  Probiotic Product (PROBIOTIC PO), Take 1 capsule by mouth daily., Disp: , Rfl:  .  RESVERATROL PO, Take 2 capsules by mouth daily. Vivix, Disp: , Rfl:  .  tamsulosin (FLOMAX) 0.4 MG CAPS capsule, TAKE 2 CAPSULES BY MOUTH EVERY DAY, Disp: 180 capsule, Rfl: 3 .  TURMERIC PO, Take 1 capsule by mouth daily., Disp: , Rfl:   Physical exam:  Vitals:   04/01/20 1028  BP: (!) 154/64  Pulse: (!) 59  Temp: (!) 96.6 F (35.9 C)  TempSrc: Tympanic  SpO2: 100%  Weight: 246 lb 11.2 oz (111.9 kg)   Physical Exam   CMP Latest Ref Rng & Units 04/01/2020  Glucose 70 - 99 mg/dL 163(H)  BUN 8 - 23 mg/dL 25(H)  Creatinine 0.61 - 1.24 mg/dL 1.12  Sodium 135 - 145 mmol/L 140  Potassium 3.5 - 5.1 mmol/L 4.4  Chloride 98 - 111 mmol/L 107  CO2 22 - 32 mmol/L 24  Calcium 8.9 - 10.3 mg/dL 8.5(L)  Total Protein 6.5 - 8.1 g/dL 6.9  Total Bilirubin 0.3 - 1.2 mg/dL 0.4  Alkaline Phos 38 - 126 U/L 53  AST 15 - 41 U/L 20  ALT 0 - 44 U/L 18   CBC Latest Ref Rng & Units 04/01/2020  WBC 4.0 - 10.5 K/uL 5.4  Hemoglobin 13.0 - 17.0 g/dL 11.1(L)  Hematocrit 39 - 52 % 32.9(L)  Platelets 150 - 400 K/uL 189     Assessment and plan- Patient is a 86 y.o. male with history of castrate resistant metastatic prostate cancer to his lymph nodes.  He is here for routine follow-up  Patient received his 75-month Eligard dose in early February 2021 but does not wish to continue with Eligard at this time.Patient's PSA went down from 6.7 in January 20 21-1.87 in June 2021.  PSA levels from today were pending at the time of my visit with the patient.  Regardless patient does not wish to take Lupron/Eligard shots at this time given his quality of life is worse with ongoing back pain as well as leg pain and poor exercise tolerance.  He is agreeable to watchful monitoring of his PSA.  In the past his PSA was going up even when he was on ADT however his PSA doubling time remained around 10 months and no  additional ADT was started.  If PSA continues to rise and patient does not wish to go back on Eligard, we will need to see if Provenge would be an option for him  I  will see him back in 6 months with CBC with differential CMP and PSA.  He would be seen by Dr. Diamantina Providence in the interim in 3 months   Visit Diagnosis 1. Prostate cancer [facility])      Dr. Randa Evens, MD, MPH [facility] at [facility] [phone] 04/01/2020 1:25 PM

## 2020-04-09 DIAGNOSIS — M9903 Segmental and somatic dysfunction of lumbar region: Secondary | ICD-10-CM | POA: Diagnosis not present

## 2020-04-09 DIAGNOSIS — M9905 Segmental and somatic dysfunction of pelvic region: Secondary | ICD-10-CM | POA: Diagnosis not present

## 2020-04-09 DIAGNOSIS — M62830 Muscle spasm of back: Secondary | ICD-10-CM | POA: Diagnosis not present

## 2020-04-09 DIAGNOSIS — M5416 Radiculopathy, lumbar region: Secondary | ICD-10-CM | POA: Diagnosis not present

## 2020-05-07 DIAGNOSIS — M62830 Muscle spasm of back: Secondary | ICD-10-CM | POA: Diagnosis not present

## 2020-05-07 DIAGNOSIS — M9903 Segmental and somatic dysfunction of lumbar region: Secondary | ICD-10-CM | POA: Diagnosis not present

## 2020-05-07 DIAGNOSIS — M5416 Radiculopathy, lumbar region: Secondary | ICD-10-CM | POA: Diagnosis not present

## 2020-05-07 DIAGNOSIS — M9905 Segmental and somatic dysfunction of pelvic region: Secondary | ICD-10-CM | POA: Diagnosis not present

## 2020-06-04 DIAGNOSIS — M5416 Radiculopathy, lumbar region: Secondary | ICD-10-CM | POA: Diagnosis not present

## 2020-06-04 DIAGNOSIS — M9903 Segmental and somatic dysfunction of lumbar region: Secondary | ICD-10-CM | POA: Diagnosis not present

## 2020-06-04 DIAGNOSIS — M9905 Segmental and somatic dysfunction of pelvic region: Secondary | ICD-10-CM | POA: Diagnosis not present

## 2020-06-04 DIAGNOSIS — M62830 Muscle spasm of back: Secondary | ICD-10-CM | POA: Diagnosis not present

## 2020-06-12 ENCOUNTER — Other Ambulatory Visit (INDEPENDENT_AMBULATORY_CARE_PROVIDER_SITE_OTHER): Payer: Medicare Other

## 2020-06-12 ENCOUNTER — Ambulatory Visit: Payer: Medicare Other

## 2020-06-12 ENCOUNTER — Telehealth: Payer: Self-pay

## 2020-06-12 DIAGNOSIS — E559 Vitamin D deficiency, unspecified: Secondary | ICD-10-CM

## 2020-06-12 DIAGNOSIS — Z79899 Other long term (current) drug therapy: Secondary | ICD-10-CM | POA: Diagnosis not present

## 2020-06-12 DIAGNOSIS — E785 Hyperlipidemia, unspecified: Secondary | ICD-10-CM

## 2020-06-12 DIAGNOSIS — C61 Malignant neoplasm of prostate: Secondary | ICD-10-CM

## 2020-06-12 DIAGNOSIS — R7303 Prediabetes: Secondary | ICD-10-CM

## 2020-06-12 LAB — BASIC METABOLIC PANEL
BUN: 22 mg/dL (ref 6–23)
CO2: 26 mEq/L (ref 19–32)
Calcium: 9.1 mg/dL (ref 8.4–10.5)
Chloride: 104 mEq/L (ref 96–112)
Creatinine, Ser: 1.1 mg/dL (ref 0.40–1.50)
GFR: 60.24 mL/min (ref >60.00)
Glucose, Bld: 106 mg/dL — ABNORMAL HIGH (ref 70–99)
Potassium: 4.4 mEq/L (ref 3.5–5.1)
Sodium: 138 mEq/L (ref 135–145)

## 2020-06-12 LAB — LIPID PANEL
Cholesterol: 200 mg/dL (ref 0–200)
HDL: 61.1 mg/dL (ref >39.00)
LDL Cholesterol: 114 mg/dL — ABNORMAL HIGH (ref 0–99)
NonHDL: 138.59
Total CHOL/HDL Ratio: 3
Triglycerides: 123 mg/dL (ref 0.0–149.0)
VLDL: 24.6 mg/dL (ref 0.0–40.0)

## 2020-06-12 LAB — CBC WITH DIFFERENTIAL/PLATELET
Basophils Absolute: 0 10*3/uL (ref 0.0–0.1)
Basophils Relative: 0.5 % (ref 0.0–3.0)
Eosinophils Absolute: 0.2 10*3/uL (ref 0.0–0.7)
Eosinophils Relative: 3.7 % (ref 0.0–5.0)
HCT: 36 % — ABNORMAL LOW (ref 39.0–52.0)
Hemoglobin: 12.2 g/dL — ABNORMAL LOW (ref 13.0–17.0)
Lymphocytes Relative: 13.6 % (ref 12.0–46.0)
Lymphs Abs: 0.7 10*3/uL (ref 0.7–4.0)
MCHC: 33.9 g/dL (ref 30.0–36.0)
MCV: 94.5 fl (ref 78.0–100.0)
Monocytes Absolute: 0.5 10*3/uL (ref 0.1–1.0)
Monocytes Relative: 9.4 % (ref 3.0–12.0)
Neutro Abs: 4 10*3/uL (ref 1.4–7.7)
Neutrophils Relative %: 72.8 % (ref 43.0–77.0)
Platelets: 219 10*3/uL (ref 150.0–400.0)
RBC: 3.81 Mil/uL — ABNORMAL LOW (ref 4.22–5.81)
RDW: 14.1 % (ref 11.5–15.5)
WBC: 5.5 10*3/uL (ref 4.0–10.5)

## 2020-06-12 LAB — VITAMIN D 25 HYDROXY (VIT D DEFICIENCY, FRACTURES): VITD: 38.74 ng/mL (ref 30.00–100.00)

## 2020-06-12 LAB — HEPATIC FUNCTION PANEL
ALT: 17 U/L (ref 0–53)
AST: 17 U/L (ref 0–37)
Albumin: 4.3 g/dL (ref 3.5–5.2)
Alkaline Phosphatase: 48 U/L (ref 39–117)
Bilirubin, Direct: 0.1 mg/dL (ref 0.0–0.3)
Total Bilirubin: 0.5 mg/dL (ref 0.2–1.2)
Total Protein: 6.7 g/dL (ref 6.0–8.3)

## 2020-06-12 LAB — HEMOGLOBIN A1C: Hgb A1c MFr Bld: 6.1 % (ref 4.6–6.5)

## 2020-06-12 LAB — PSA: PSA: 3.86 ng/mL (ref 0.10–4.00)

## 2020-06-12 NOTE — Telephone Encounter (Signed)
Called patient 4 times trying to complete AWV. Kept going straight to voicemail. Left message notifying patient I called and he can reschedule or provider may complete at his upcoming visit.

## 2020-06-12 NOTE — Progress Notes (Deleted)
Subjective:   Cesar Powell is a 86 y.o. male who presents for Medicare Annual/Subsequent preventive examination.  Review of Systems: N/A      I connected with the patient today by telephone and verified that I am speaking with the correct person using two identifiers. Location patient: home Location nurse: work Persons participating in the telephone visit: patient, nurse.   I discussed the limitations, risks, security and privacy concerns of performing an evaluation and management service by telephone and the availability of in person appointments. I also discussed with the patient that there may be a patient responsible charge related to this service. The patient expressed understanding and verbally consented to this telephonic visit.              Objective:    There were no vitals filed for this visit. There is no height or weight on file to calculate BMI.  Advanced Directives 04/01/2020 02/09/2020 11/27/2019 11/06/2019 10/30/2019 10/04/2019 08/16/2019  Does Patient Have a Medical Advance Directive? No No No No No No No  Does patient want to make changes to medical advance directive? - - No - Patient declined - - - -  Would patient like information on creating a medical advance directive? - No - Patient declined No - Patient declined - No - Patient declined No - Patient declined No - Patient declined    Current Medications (verified) Outpatient Encounter Medications as of 06/12/2020  Medication Sig  . ascorbic acid (RA VITAMIN C) 500 MG tablet Take 1,000 mg by mouth daily.  . Cholecalciferol (VITAMIN D3) 50 MCG (2000 UT) TABS Take 2,000 Units by mouth daily.  . Flaxseed, Linseed, (FLAXSEED OIL PO) Take 1 capsule by mouth daily.  . Misc Natural Products (IMMUNE FORMULA PO) Take 2 capsules by mouth daily. Nutriferon by [employer]  . Misc Natural Products (JOINT HEALTH PO) Take 2 tablets by mouth daily.  . Multiple Vitamin (MULTIVITAMIN WITH MINERALS) TABS tablet Take 2 tablets by  mouth daily. Vita-Lea  . Multiple Vitamins-Minerals (ZINC PO) Take 1 tablet by mouth daily.  . Nutritional Supplements (IMMUNE ENHANCE) TABS Take 1 tablet by mouth daily.  [name] omega-3 fish oil (MAXEPA) 1000 MG CAPS capsule Take 1 capsule by mouth.  . Probiotic Product (PROBIOTIC PO) Take 1 capsule by mouth daily.  [name] RESVERATROL PO Take 2 capsules by mouth daily. Vivix  . tamsulosin (FLOMAX) 0.4 MG CAPS capsule TAKE 2 CAPSULES BY MOUTH EVERY DAY  . TURMERIC PO Take 1 capsule by mouth daily.   No facility-administered encounter medications on file as of 06/12/2020.    Allergies (verified) Patient has no known allergies.   History: Past Medical History:  Diagnosis Date  . Discoloration of nail   . H/O prostate cancer    was treated with radiation  . Hypertension   . Local recurrence of prostate cancer ([place]) 06/21/2004   Qualifier: Diagnosis of  By: Fuller Plan CMA (AAMA), Lugene    . Shingles   . Sleep apnea 1977   uses C-pap    Past Surgical History:  Procedure Laterality Date  . ABDOMINAL SURGERY     ruptured intestines   . CATARACT EXTRACTION W/PHACO Left 01/29/2016   Procedure: CATARACT EXTRACTION PHACO AND INTRAOCULAR LENS PLACEMENT ([place]) left eye;  Surgeon: Leandrew Koyanagi, MD;  Location: [place];  Service: Ophthalmology;  Laterality: Left;  RESTOR LENS  . CATARACT EXTRACTION W/PHACO Right 10/21/2016   Procedure: CATARACT EXTRACTION PHACO AND INTRAOCULAR LENS PLACEMENT ([facility])  Right restor toric  lens;  Surgeon: Leandrew Koyanagi, MD;  Location: [place];  Service: Ophthalmology;  Laterality: Right;  restor toric lens  . CYSTOSCOPY WITH LITHOLAPAXY N/A 08/14/2016   Procedure: CYSTOSCOPY WITH LITHOLAPAXY;  Surgeon: Nickie Retort, MD;  Location: [facility];  Service: Urology;  Laterality: N/A;  . FEMUR IM NAIL Right 10/08/2014   Procedure: INTRAMEDULLARY (IM) NAIL FEMORAL;  Surgeon: Rod Can, MD;  Location: [place];  Service: Orthopedics;  Laterality:  Right;  PER DANIELLE 110 MIN  . HOLMIUM LASER APPLICATION  1/77/1165   Procedure: HOLMIUM LASER APPLICATION;  Surgeon: Nickie Retort, MD;  Location: [facility];  Service: Urology;;  . OTHER SURGICAL HISTORY  2006   Prostate Radiation Surgery   . RADIOACTIVE SEED IMPLANT N/A 10/30/2019   Procedure: RADIOACTIVE SEED IMPLANT/BRACHYTHERAPY IMPLANT;  Surgeon: Billey Co, MD;  Location: [facility];  Service: Urology;  Laterality: N/A;  24 seeds implanted   Family History  Problem Relation Age of Onset  . Heart disease Brother   . Heart attack Brother   . Lung cancer Brother   . Prostate cancer Neg Hx   . Bladder Cancer Neg Hx   . Kidney cancer Neg Hx    Social History   Socioeconomic History  . Marital status: Married    Spouse name: Not on file  . Number of children: Not on file  . Years of education: Not on file  . Highest education level: Not on file  Occupational History  . Occupation: retired  Tobacco Use  . Smoking status: Former Smoker    Packs/day: 1.00    Years: 15.00    Pack years: 15.00    Types: Cigarettes    Quit date: 09/14/1965    Years since quitting: 54.7  . Smokeless tobacco: Never Used  Vaping Use  . Vaping Use: Never used  Substance and Sexual Activity  . Alcohol use: Not Currently    Comment: occassiona;   [name] Drug use: No  . Sexual activity: Yes    Birth control/protection: None  Other Topics Concern  . Not on file  Social History Narrative  . Not on file   Social Determinants of Health   Financial Resource Strain:   . Difficulty of Paying Living Expenses: Not on file  Food Insecurity:   . Worried About Charity fundraiser in the Last Year: Not on file  . Ran Out of Food in the Last Year: Not on file  Transportation Needs:   . Lack of Transportation (Medical): Not on file  . Lack of Transportation (Non-Medical): Not on file  Physical Activity:   . Days of Exercise per Week: Not on file  . Minutes of Exercise per Session: Not on file    Stress:   . Feeling of Stress : Not on file  Social Connections:   . Frequency of Communication with Friends and Family: Not on file  . Frequency of Social Gatherings with Friends and Family: Not on file  . Attends Religious Services: Not on file  . Active Member of Clubs or Organizations: Not on file  . Attends Archivist Meetings: Not on file  . Marital Status: Not on file    Tobacco Counseling Counseling given: Not Answered   Clinical Intake:                 Diabetic: No Nutrition Risk Assessment:  Has the patient had any N/V/D within the last 2 months?  {YES/NO:21197} Does the patient have any  non-healing wounds?  {YES/NO:21197} Has the patient had any unintentional weight loss or weight gain?  {YES/NO:21197}  Diabetes:  Is the patient diabetic?  No  If diabetic, was a CBG obtained today?  N/A Did the patient bring in their glucometer from home?  N/A How often do you monitor your CBG's? N/A.   Financial Strains and Diabetes Management:  Are you having any financial strains with the device, your supplies or your medication? N/A.  Does the patient want to be seen by Chronic Care Management for management of their diabetes?  N/A Would the patient like to be referred to a Nutritionist or for Diabetic Management?  N/A           Activities of Daily Living In your present state of health, do you have any difficulty performing the following activities: 10/30/2019 10/19/2019  Hearing? Y Y  Comment - uses hearing aids  Vision? N N  Difficulty concentrating or making decisions? N N  Walking or climbing stairs? Y N  Dressing or bathing? N N  Doing errands, shopping? - N  Some recent data might be hidden    Patient Care Team: Owens Loffler, MD as PCP - General Leandrew Koyanagi, MD as Referring Physician (Ophthalmology) Nickie Retort, MD as Consulting Physician (Urology) Noreene Filbert, MD as Radiation Oncologist (Radiation  Oncology)  Indicate any recent Medical Services you may have received from other than [facility] providers in the past year (date may be approximate).     Assessment:   This is a routine wellness examination for Camila.  Hearing/Vision screen No exam data present  Dietary issues and exercise activities discussed:    Goals    . Increase physical activity     Starting 06/03/2018, I will continue to exercise for 30-75 minutes 5 days per week.     . Patient Stated     06/12/2019, I want to maintain and continue medications as prescribed.       Depression Screen PHQ 2/9 Scores 06/12/2019 06/03/2018 06/02/2017 10/21/2015  PHQ - 2 Score 0 0 0 0  PHQ- 9 Score 0 0 0 -    Fall Risk Fall Risk  06/28/2019 06/12/2019 06/03/2018 06/02/2017 10/21/2015  Falls in the past year? 0 0 0 No No  Comment Emmi Telephone Survey: data to providers prior to load - - - -  Number falls in past yr: - 0 - - -  Injury with Fall? - 0 - - -  Follow up - Falls prevention discussed;Falls evaluation completed - - -    Any stairs in or around the home? Yes  If so, are there any without handrails? No  Home free of loose throw rugs in walkways, pet beds, electrical cords, etc? Yes  Adequate lighting in your home to reduce risk of falls? Yes   ASSISTIVE DEVICES UTILIZED TO PREVENT FALLS:  Life alert? {YES/NO:21197} Use of a cane, walker or w/c? {YES/NO:21197} Grab bars in the bathroom? {YES/NO:21197} Shower chair or bench in shower? {YES/NO:21197} Elevated toilet seat or a handicapped toilet? {YES/NO:21197}  TIMED UP AND GO:  Was the test performed? N/A, telephonic visit .    Cognitive Function: MMSE - Mini Mental State Exam 06/12/2019 06/03/2018 06/02/2017  Orientation to time 5 5 5   Orientation to Place 5 5 5   Registration 3 3 3   Attention/ Calculation 5 0 0  Recall 3 1 3   Recall-comments - unable to recall 2 of 3 words -  Language- name 2 objects - 0 0  Language- repeat 1 1 1   Language- follow 3 step  command - 3 3  Language- read & follow direction - 0 0  Write a sentence - 0 0  Copy design - 0 0  Total score - 18 20  Mini Cog  Mini-Cog screen was completed. Maximum score is 22. A value of 0 denotes this part of the MMSE was not completed or the patient failed this part of the Mini-Cog screening.       Immunizations Immunization History  Administered Date(s) Administered  . Influenza Whole 09/05/2009  . Influenza,inj,Quad PF,6+ Mos 06/10/2017, 08/15/2018, 06/19/2019  . Pneumococcal Conjugate-13 06/10/2017  . Pneumococcal Polysaccharide-23 09/05/2009  . Td 09/05/2009  . Zoster 09/05/2009    TDAP status: Due, Education has been provided regarding the importance of this vaccine. Advised may receive this vaccine at local pharmacy or Health Dept. Aware to provide a copy of the vaccination record if obtained from local pharmacy or Health Dept. Verbalized acceptance and understanding. {Flu Vaccine status:2101806} Pneumococcal vaccine status: Up to date {Covid-19 vaccine status:2101808}  Qualifies for Shingles Vaccine? Yes   Zostavax completed Yes   Shingrix Completed?: No.    Education has been provided regarding the importance of this vaccine. Patient has been advised to call insurance company to determine out of pocket expense if they have not yet received this vaccine. Advised may also receive vaccine at local pharmacy or Health Dept. Verbalized acceptance and understanding.  Screening Tests Health Maintenance  Topic Date Due  . COVID-19 Vaccine (1) Never done  . TETANUS/TDAP  09/06/2019  . INFLUENZA VACCINE  03/03/2020  . PNA vac Low Risk Adult  Completed    Health Maintenance  Health Maintenance Due  Topic Date Due  . COVID-19 Vaccine (1) Never done  . TETANUS/TDAP  09/06/2019  . INFLUENZA VACCINE  03/03/2020    Colorectal cancer screening: No longer required.   Lung Cancer Screening: (Low Dose CT Chest recommended if Age 77-80 years, 30 pack-year currently  smoking OR have quit w/in 15years.) does not qualify.     Additional Screening:  Hepatitis C Screening: does not qualify; Completed N/A  Vision Screening: Recommended annual ophthalmology exams for early detection of glaucoma and other disorders of the eye. Is the patient up to date with their annual eye exam?  {YES/NO:21197} Who is the provider or what is the name of the office in which the patient attends annual eye exams? *** If pt is not established with a provider, would they like to be referred to a provider to establish care? No .   Dental Screening: Recommended annual dental exams for proper oral hygiene  Community Resource Referral / Chronic Care Management: CRR required this visit?  No   CCM required this visit?  No      Plan:     I have personally reviewed and noted the following in the patient's chart:   . Medical and social history . Use of alcohol, tobacco or illicit drugs  . Current medications and supplements . Functional ability and status . Nutritional status . Physical activity . Advanced directives . List of other physicians . Hospitalizations, surgeries, and ER visits in previous 12 months . Vitals . Screenings to include cognitive, depression, and falls . Referrals and appointments  In addition, I have reviewed and discussed with patient certain preventive protocols, quality metrics, and best practice recommendations. A written personalized care plan for preventive services as well as general preventive health recommendations were provided to patient.  Due to this being a telephonic visit, the after visit summary with patients personalized plan was offered to patient via office or my-chart. Patient preferred to pick up at office at next visit or via mychart.   Andrez Grime, LPN   [id number]

## 2020-06-18 ENCOUNTER — Encounter: Payer: Self-pay | Admitting: Family Medicine

## 2020-06-18 NOTE — Progress Notes (Signed)
Yailyn Strack T. Jackelyn Illingworth, MD, [place]  [facility] and [place] at [facility] [place], [address]  Phone: [phone]  FAX: [phone]  Cesar Powell - 86 y.o. male  MRN [id number]  Date of Birth: 1931/03/27  Date: 06/19/2020  PCP: Owens Loffler, MD  Referral: Owens Loffler, MD  Chief Complaint  Patient presents with  . Medicare Wellness    This visit occurred during the SARS-CoV-2 public health emergency.  Safety protocols were in place, including screening questions prior to the visit, additional usage of staff PPE, and extensive cleaning of exam room while observing appropriate contact time as indicated for disinfecting solutions.   Patient Care Team: Owens Loffler, MD as PCP - General Leandrew Koyanagi, MD as Referring Physician (Ophthalmology) Nickie Retort, MD as Consulting Physician (Urology) Noreene Filbert, MD as Radiation Oncologist (Radiation Oncology) Subjective:   Cesar Powell is a 86 y.o. pleasant patient who presents for a medicare wellness examination:  Preventative Health Maintenance Visit:  Health Maintenance Summary Reviewed and updated, unless pt declines services.  Tobacco History Reviewed. 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 He is being seen by Dr. Janese Banks.  Shingrix  Left leg hurts pretty often and feels like it is waking up with some pins and needles. He denies frank radiculopathy.   All started when he was changed his prostate ca new med.  Since then, he did not get it again at least as severely.  His legs continue to be weak.  They are not as bad as before.  Going to the gym and they are gradually getting better, but will come and  go.  L side only  Health Maintenance  Topic Date Due  . TETANUS/TDAP  09/06/2019  . INFLUENZA VACCINE  10/31/2020 (Originally 03/03/2020)  . COVID-19 Vaccine  Completed  . PNA vac Low Risk Adult  Completed    Immunization History  Administered Date(s) Administered  . Influenza Whole 09/05/2009  . Influenza,inj,Quad PF,6+ Mos 06/10/2017, 08/15/2018, 06/19/2019  . PFIZER SARS-COV-2 Vaccination 10/16/2019, 11/09/2019  . Pneumococcal Conjugate-13 06/10/2017  . Pneumococcal Polysaccharide-23 09/05/2009  . Td 09/05/2009  . Zoster 09/05/2009    Patient Active Problem List   Diagnosis Date Noted  . Prostate cancer ([place]) 08/08/2019    Priority: High  . Local recurrence of prostate cancer ([place]) 06/22/2007    Priority: High  . Subtrochanteric fracture of femur ([place]) 10/08/2014  . Essential hypertension 09/06/2009  . METABOLIC SYNDROME X 26/83/4196  . Obstructive sleep apnea 06/22/2007  . DIVERTICULITIS, HX OF 06/22/2007    Past Medical History:  Diagnosis Date  . H/O prostate cancer    was treated with radiation  . Hypertension   . Local recurrence of prostate cancer ([place]) 06/21/2004   Qualifier: Diagnosis of  By: Fuller Plan CMA (AAMA), [place]    . Obstructive sleep apnea 06/22/2007   NPSG [place] 1979 Unattended Home sleep Study- 05/10/14- Confirms severe OSA, AHI 41.8/ hr, weight 230 pounds CPAP and also Transcend portable CPAP auto 8-15    . Sleep apnea 1977   uses C-pap     Past Surgical History:  Procedure Laterality Date  . ABDOMINAL SURGERY     ruptured intestines   . CATARACT EXTRACTION  W/PHACO Left 01/29/2016   Procedure: CATARACT EXTRACTION PHACO AND INTRAOCULAR LENS PLACEMENT (IOC) left eye;  Surgeon: Leandrew Koyanagi, MD;  Location: [facility];  Service: Ophthalmology;  Laterality: Left;  RESTOR LENS  . CATARACT EXTRACTION W/PHACO Right 10/21/2016   Procedure: CATARACT EXTRACTION PHACO AND INTRAOCULAR LENS PLACEMENT (IOC)  Right restor toric lens;   Surgeon: Leandrew Koyanagi, MD;  Location: [facility];  Service: Ophthalmology;  Laterality: Right;  restor toric lens  . CYSTOSCOPY WITH LITHOLAPAXY N/A 08/14/2016   Procedure: CYSTOSCOPY WITH LITHOLAPAXY;  Surgeon: Nickie Retort, MD;  Location: [facility];  Service: Urology;  Laterality: N/A;  . FEMUR IM NAIL Right 10/08/2014   Procedure: INTRAMEDULLARY (IM) NAIL FEMORAL;  Surgeon: Rod Can, MD;  Location: [facility];  Service: Orthopedics;  Laterality: Right;  PER DANIELLE 110 MIN  . HOLMIUM LASER APPLICATION  10/01/6008   Procedure: HOLMIUM LASER APPLICATION;  Surgeon: Nickie Retort, MD;  Location: [facility];  Service: Urology;;  . OTHER SURGICAL HISTORY  2006   Prostate Radiation Surgery   . RADIOACTIVE SEED IMPLANT N/A 10/30/2019   Procedure: RADIOACTIVE SEED IMPLANT/BRACHYTHERAPY IMPLANT;  Surgeon: Billey Co, MD;  Location: [facility];  Service: Urology;  Laterality: N/A;  6 seeds implanted    Family History  Problem Relation Age of Onset  . Heart disease Brother   . Heart attack Brother   . Lung cancer Brother   . Prostate cancer Neg Hx   . Bladder Cancer Neg Hx   . Kidney cancer Neg Hx     Past Medical History, Surgical History, Social History, Family History, Problem List, Medications, and Allergies have been reviewed and updated if relevant.  Review of Systems: Pertinent positives are listed above.  Otherwise, a full 14 point review of systems has been done in full and it is negative except where it is noted positive.  Objective:   BP (!) 152/60   Pulse 72   Temp (!) 97.5 F (36.4 C) (Temporal)   Ht 5' 6.5" (1.689 m)   Wt 240 lb 8 oz (109.1 kg)   SpO2 98%   BMI 38.24 kg/m  Fall Risk  06/19/2020 06/28/2019 06/12/2019 06/03/2018 06/02/2017  Falls in the past year? 0 0 0 0 No  Comment - [place] Telephone Survey: data to providers prior to load - - -  Number falls in past yr: - - 0 - -  Injury with Fall? - - 0 - -  Follow up - - Falls prevention  discussed;Falls evaluation completed - -   Ideal Body Weight: Weight in (lb) to have BMI = 25: 156.9  Hearing Screening   125Hz  250Hz  500Hz  1000Hz  2000Hz  3000Hz  4000Hz  6000Hz  8000Hz   Right ear:           Left ear:           Comments: Wears Bilateral Hearing Aides  Depression screen [facility] 2/9 06/19/2020 06/12/2019 06/03/2018 06/02/2017 10/21/2015  Decreased Interest 0 0 0 0 0  Down, Depressed, Hopeless 0 0 0 0 0  PHQ - 2 Score 0 0 0 0 0  Altered sleeping - 0 0 0 -  Tired, decreased energy - 0 0 0 -  Change in appetite - 0 0 0 -  Feeling bad or failure about yourself  - 0 0 0 -  Trouble concentrating - 0 0 0 -  Moving slowly or fidgety/restless - 0 0 0 -  Suicidal thoughts - 0 0 0 -  PHQ-9 Score - 0  0 0 -  Difficult doing work/chores - Not difficult at all Not difficult at all Not difficult at all -     GEN: well developed, well nourished, no acute distress Eyes: conjunctiva and lids normal, PERRLA, EOMI ENT: TM clear, nares clear, oral exam WNL Neck: supple, no lymphadenopathy, no thyromegaly, no JVD Pulm: clear to auscultation and percussion, respiratory effort normal CV: regular rate and rhythm, S1-S2, no murmur, rub or gallop, no bruits, peripheral pulses normal and symmetric, no cyanosis, clubbing, edema or varicosities GI: soft, non-tender; no hepatosplenomegaly, masses; active bowel sounds all quadrants GU: deferred Lymph: no cervical, axillary or inguinal adenopathy MSK:   He is able to forward flex at the waist to approximately 95 degrees, and extension, lateral bending, and rotational movements are intact.  He is tender on the left erector spinae complex from approximately L2-S1.  Right is also tender to a much lesser degree.  Spinous processes are nontender.  SI joints as well as the sacrum and upper pelvis are nontender.  Rises to examination table with no difficulty Gait: non antalgic  B Ankle Dorsiflexion (L5,4): 5/5 B Great Toe Dorsiflexion (L5,4):  5/5 Plantarflexion: 5/5  SENSORY B Medial Foot (L4): WNL B Dorsum (L5): WNL B Lateral (S1): WNL  B SLR, seated: neg B Greater Troch: NT B Log Roll: neg B Sciatic Notch: NT   SKIN: clear, good turgor, color WNL, no rashes, lesions, or ulcerations Neuro: normal mental status, normal strength, sensation, and motion Psych: alert; oriented to person, place and time, normally interactive and not anxious or depressed in appearance.  All labs reviewed with patient.  Results for orders placed or performed in visit on 06/12/20  Lipid panel  Result Value Ref Range   Cholesterol 200 0 - 200 mg/dL   Triglycerides 123.0 0 - 149 mg/dL   HDL 61.10 >39.00 mg/dL   VLDL 24.6 0.0 - 40.0 mg/dL   LDL Cholesterol 114 (H) 0 - 99 mg/dL   Total CHOL/HDL Ratio 3    NonHDL 138.59   Hepatic function panel  Result Value Ref Range   Total Bilirubin 0.5 0.2 - 1.2 mg/dL   Bilirubin, Direct 0.1 0.0 - 0.3 mg/dL   Alkaline Phosphatase 48 39 - 117 U/L   AST 17 0 - 37 U/L   ALT 17 0 - 53 U/L   Total Protein 6.7 6.0 - 8.3 g/dL   Albumin 4.3 3.5 - 5.2 g/dL  Basic metabolic panel  Result Value Ref Range   Sodium 138 135 - 145 mEq/L   Potassium 4.4 3.5 - 5.1 mEq/L   Chloride 104 96 - 112 mEq/L   CO2 26 19 - 32 mEq/L   Glucose, Bld 106 (H) 70 - 99 mg/dL   BUN 22 6 - 23 mg/dL   Creatinine, Ser 1.10 0.40 - 1.50 mg/dL   GFR 60.24 >60.00 mL/min   Calcium 9.1 8.4 - 10.5 mg/dL  CBC with Differential/Platelet  Result Value Ref Range   WBC 5.5 4.0 - 10.5 K/uL   RBC 3.81 (L) 4.22 - 5.81 Mil/uL   Hemoglobin 12.2 (L) 13.0 - 17.0 g/dL   HCT 36.0 (L) 39 - 52 %   MCV 94.5 78.0 - 100.0 fl   MCHC 33.9 30.0 - 36.0 g/dL   RDW 14.1 11.5 - 15.5 %   Platelets 219.0 150 - 400 K/uL   Neutrophils Relative % 72.8 43 - 77 %   Lymphocytes Relative 13.6 12 - 46 %   Monocytes Relative 9.4  3 - 12 %   Eosinophils Relative 3.7 0 - 5 %   Basophils Relative 0.5 0 - 3 %   Neutro Abs 4.0 1.4 - 7.7 K/uL   Lymphs Abs 0.7 0.7 - 4.0  K/uL   Monocytes Absolute 0.5 0.1 - 1.0 K/uL   Eosinophils Absolute 0.2 0.0 - 0.7 K/uL   Basophils Absolute 0.0 0.0 - 0.1 K/uL  Hemoglobin A1c  Result Value Ref Range   Hgb A1c MFr Bld 6.1 4.6 - 6.5 %  VITAMIN D 25 Hydroxy (Vit-D Deficiency, Fractures)  Result Value Ref Range   VITD 38.74 30.00 - 100.00 ng/mL  PSA  Result Value Ref Range   PSA 3.86 0.10 - 4.00 ng/mL    Assessment and Plan:     ICD-10-CM   1. Healthcare maintenance  Z00.00   2. Numbness and tingling of both legs  R20.0    R20.2   3. Acute back pain, unspecified back location, unspecified back pain laterality  M54.9    Body mass index is 38.24 kg/m.  Continue to work on weight, and exercise as able.  Obtain Covid 19 vaccine #3 Obtain Shingrix  Ongoing left-sided back pain with some subjective tingling without acute radiculopathy.  For now, prednisone taper, weight loss, continue to be as active as possible.  Health Maintenance Exam: The patient's preventative maintenance and recommended screening tests for an annual wellness exam were reviewed in full today. Brought up to date unless services declined.  Counselled on the importance of diet, exercise, and its role in overall health and mortality. The patient's FH and SH was reviewed, including their home life, tobacco status, and drug and alcohol status.  Follow-up in 1 year for physical exam or additional follow-up below.  I have personally reviewed the Medicare Annual Wellness questionnaire and have noted 1. The patient's medical and social history 2. Their use of alcohol, tobacco or illicit drugs 3. Their current medications and supplements 4. The patient's functional ability including ADL's, fall risks, home safety risks and hearing or visual             impairment. 5. Diet and physical activities 6. Evidence for depression or mood disorders 7. Reviewed Updated provider list, see scanned forms and [facility] Snapshot.  8. Reviewed whether or not the patient  has HCPOA or living will, and discussed what this means with the patient.  Recommended he bring in a copy for his chart in [facility].  The patients weight, height, BMI and visual acuity have been recorded in the chart I have made referrals, counseling and provided education to the patient based review of the above and I have provided the pt with a written personalized care plan for preventive services.  I have provided the patient with a copy of your personalized plan for preventive services. Instructed to take the time to review along with their updated medication list.  Follow-up: No follow-ups on file. Or follow-up in 1 year if not noted.  Future Appointments  Date Time Provider [place]  08/12/2020  9:30 AM Noreene Filbert, MD [facility] None  10/03/2020  9:30 AM [facility] None  10/03/2020 10:00 AM Sindy Guadeloupe, MD [facility] None  01/24/2021 10:00 AM [facility] None  01/27/2021  3:45 PM Diamantina [place] Herbert Seta, MD [facility] None    Meds ordered this encounter  Medications  . predniSONE (DELTASONE) 20 MG tablet    Sig: 2 tabs po daily for 5 days, then 1 tab po daily for 5 days  Dispense:  15 tablet    Refill:  0   There are no discontinued medications. No orders of the defined types were placed in this encounter.   Signed,  Maud Deed. Boyde Grieco, MD   Allergies as of 06/19/2020   No Known Allergies     Medication List       Accurate as of June 19, 2020 11:59 PM. If you have any questions, ask your nurse or doctor.        FLAXSEED OIL PO Take 1 capsule by mouth daily.   Immune Enhance Tabs Take 1 tablet by mouth daily.   JOINT HEALTH PO Take 2 tablets by mouth daily.   IMMUNE FORMULA PO Take 2 capsules by mouth daily. Nutriferon by Shaklee   multivitamin with minerals Tabs tablet Take 2 tablets by mouth daily. Vita-Lea   omega-3 fish oil 1000 MG Caps capsule Commonly known as: MAXEPA Take 1 capsule by mouth.   predniSONE 20 MG  tablet Commonly known as: DELTASONE 2 tabs po daily for 5 days, then 1 tab po daily for 5 days Started by: Owens Loffler, MD   PROBIOTIC PO Take 1 capsule by mouth daily.   RA Vitamin C 500 MG tablet Generic drug: ascorbic acid Take 1,000 mg by mouth daily.   RESVERATROL PO Take 2 capsules by mouth daily. Vivix   tamsulosin 0.4 MG Caps capsule Commonly known as: FLOMAX TAKE 2 CAPSULES BY MOUTH EVERY DAY   TURMERIC PO Take 1 capsule by mouth daily.   Vitamin D3 50 MCG (2000 UT) Tabs Take 2,000 Units by mouth daily.   ZINC PO Take 1 tablet by mouth daily.       Marland Kitchendiag

## 2020-06-19 ENCOUNTER — Encounter: Payer: Self-pay | Admitting: Family Medicine

## 2020-06-19 ENCOUNTER — Other Ambulatory Visit: Payer: Self-pay

## 2020-06-19 ENCOUNTER — Ambulatory Visit (INDEPENDENT_AMBULATORY_CARE_PROVIDER_SITE_OTHER): Payer: Medicare Other | Admitting: Family Medicine

## 2020-06-19 VITALS — BP 152/60 | HR 72 | Temp 97.5°F | Ht 66.5 in | Wt 240.5 lb

## 2020-06-19 DIAGNOSIS — Z0000 Encounter for general adult medical examination without abnormal findings: Secondary | ICD-10-CM | POA: Diagnosis not present

## 2020-06-19 DIAGNOSIS — R200 Anesthesia of skin: Secondary | ICD-10-CM | POA: Diagnosis not present

## 2020-06-19 DIAGNOSIS — R202 Paresthesia of skin: Secondary | ICD-10-CM | POA: Diagnosis not present

## 2020-06-19 DIAGNOSIS — M549 Dorsalgia, unspecified: Secondary | ICD-10-CM

## 2020-06-19 MED ORDER — PREDNISONE 20 MG PO TABS: ORAL_TABLET | ORAL | 0 refills | Status: DC

## 2020-06-21 ENCOUNTER — Telehealth: Payer: Self-pay | Admitting: *Deleted

## 2020-06-21 ENCOUNTER — Other Ambulatory Visit: Payer: Self-pay | Admitting: *Deleted

## 2020-06-21 DIAGNOSIS — M549 Dorsalgia, unspecified: Secondary | ICD-10-CM

## 2020-06-21 DIAGNOSIS — C61 Malignant neoplasm of prostate: Secondary | ICD-10-CM

## 2020-06-21 NOTE — Telephone Encounter (Signed)
-----   Message from Sindy Guadeloupe, MD sent at 06/21/2020  8:46 AM EST ----- Regarding: RE: oncology / back question Hello Dr. Frederico Hamman,  We could start with a bone scan. I will take care of it.  Sherry/ [facility] [place]- can you arrange if patient agreeable?  Thanks, Astrid Divine ----- Message ----- From: Owens Loffler, MD Sent: 06/20/2020   3:34 PM EST To: Sindy Guadeloupe, MD Subject: oncology / back question                       Hey Dr. Janese Banks,   I wanted to ask you a question about our mutual patient.  I saw him yesterday for a routine wellness exam, and he has some ongoing back pain.  From a general MSK standpoint, I would not be that concerned.  I was going to have him take a round of steroids, and he is going to keep working out and trying to lose weight.  Since he has metastatic prostate cancer, I was not sure if he should get imaging to look for bone mets? (Last PET was 1 year ago)    Thank-you.  I very rarely see metastatic cancer.  Frederico Hamman

## 2020-06-21 NOTE — Telephone Encounter (Signed)
Called pt and got his voicemail and said that Dr. Lorelei Pont had reached out to Janese Banks about pt ongoing back pain and wondered if any of this was from prostate cancer. Dr. Janese Banks suggest whole body bone scan. I can put order in and schedule this. Just need to know from pt if this is ok and we can schedule it and call pt. I left my direct phone number to have him call me and give permission to set this up for him

## 2020-06-25 ENCOUNTER — Encounter: Payer: Self-pay | Admitting: *Deleted

## 2020-07-02 DIAGNOSIS — M62830 Muscle spasm of back: Secondary | ICD-10-CM | POA: Diagnosis not present

## 2020-07-02 DIAGNOSIS — M5416 Radiculopathy, lumbar region: Secondary | ICD-10-CM | POA: Diagnosis not present

## 2020-07-02 DIAGNOSIS — M9903 Segmental and somatic dysfunction of lumbar region: Secondary | ICD-10-CM | POA: Diagnosis not present

## 2020-07-02 DIAGNOSIS — M9905 Segmental and somatic dysfunction of pelvic region: Secondary | ICD-10-CM | POA: Diagnosis not present

## 2020-07-08 ENCOUNTER — Other Ambulatory Visit: Payer: Self-pay

## 2020-07-08 ENCOUNTER — Encounter
Admission: RE | Admit: 2020-07-08 | Discharge: 2020-07-08 | Disposition: A | Discharge status: Home / Self Care | Payer: Medicare Other | Source: Ambulatory Visit | Attending: Oncology | Admitting: Oncology

## 2020-07-08 ENCOUNTER — Ambulatory Visit
Admission: RE | Admit: 2020-07-08 | Discharge: 2020-07-08 | Disposition: A | Discharge status: Home / Self Care | Payer: Medicare Other | Source: Ambulatory Visit | Attending: Oncology | Admitting: Oncology

## 2020-07-08 DIAGNOSIS — C61 Malignant neoplasm of prostate: Secondary | ICD-10-CM | POA: Diagnosis not present

## 2020-07-08 DIAGNOSIS — Z8546 Personal history of malignant neoplasm of prostate: Secondary | ICD-10-CM | POA: Diagnosis not present

## 2020-07-08 DIAGNOSIS — M549 Dorsalgia, unspecified: Secondary | ICD-10-CM

## 2020-07-08 MED ORDER — TECHNETIUM TC 99M MEDRONATE IV KIT
20.0000 | PACK | Freq: Once | INTRAVENOUS | Status: AC | PRN
  Administered 2020-07-08: 22.314 via INTRAVENOUS

## 2020-07-30 DIAGNOSIS — M9905 Segmental and somatic dysfunction of pelvic region: Secondary | ICD-10-CM | POA: Diagnosis not present

## 2020-07-30 DIAGNOSIS — M5416 Radiculopathy, lumbar region: Secondary | ICD-10-CM | POA: Diagnosis not present

## 2020-07-30 DIAGNOSIS — M62830 Muscle spasm of back: Secondary | ICD-10-CM | POA: Diagnosis not present

## 2020-07-30 DIAGNOSIS — M9903 Segmental and somatic dysfunction of lumbar region: Secondary | ICD-10-CM | POA: Diagnosis not present

## 2020-08-03 DIAGNOSIS — I219 Acute myocardial infarction, unspecified: Secondary | ICD-10-CM

## 2020-08-03 HISTORY — DX: Acute myocardial infarction, unspecified: I21.9

## 2020-08-12 ENCOUNTER — Ambulatory Visit: Payer: Medicare Other | Admitting: Radiation Oncology

## 2020-08-22 ENCOUNTER — Other Ambulatory Visit: Payer: Self-pay | Admitting: *Deleted

## 2020-08-22 ENCOUNTER — Encounter: Payer: Self-pay | Admitting: Radiation Oncology

## 2020-08-22 ENCOUNTER — Other Ambulatory Visit: Payer: Self-pay

## 2020-08-22 ENCOUNTER — Ambulatory Visit
Admission: RE | Admit: 2020-08-22 | Discharge: 2020-08-22 | Disposition: A | Discharge status: Home / Self Care | Payer: Medicare Other | Source: Ambulatory Visit | Attending: Radiation Oncology | Admitting: Radiation Oncology

## 2020-08-22 VITALS — BP 144/60 | HR 55 | Resp 16 | Wt 243.8 lb

## 2020-08-22 DIAGNOSIS — M79605 Pain in left leg: Secondary | ICD-10-CM | POA: Diagnosis not present

## 2020-08-22 DIAGNOSIS — C61 Malignant neoplasm of prostate: Secondary | ICD-10-CM

## 2020-08-22 DIAGNOSIS — Z923 Personal history of irradiation: Secondary | ICD-10-CM | POA: Insufficient documentation

## 2020-08-22 NOTE — Progress Notes (Signed)
Radiation Oncology Follow up Note  Name: Cesar Powell   Date:   08/22/2020 MRN:  [id number] DOB: 02-07-1931    This 87 y.o. male presents to the clinic today for follow-up status post both external beam radiation therapy as well as I-125 interstitial implant for local advanced adenocarcinoma the prostate treated back i proximally 1 year prior.  REFERRING PROVIDER: Owens Loffler, MD  HPI: Patient is an 87 year old male treated for locally advanced adenocarcinoma the prostate with both external beam treatment to his pelvic nodes and prostate as well as I-125 interstitial boost.. He he is being followed by Dr. Janese Banks noted to have progression of his PSA to 3.862 months ago. He has declined further androgen deprivation therapy. He had a recent bone scan showing new foci of abnormal uptake at the L4 level. T there is also involvement of the posterior portion of the left first or second rib. He has been having left lower extremity pain which sounds radicular and certainly may be from his L4 involvement. He otherwise is without complaint.  COMPLICATIONS OF TREATMENT: none  FOLLOW UP COMPLIANCE: keeps appointments   PHYSICAL EXAM:  BP (!) 144/60 (BP Location: Left Arm)   Pulse (!) 55   Resp 16   Wt 243 lb 12.8 oz (110.6 kg)   BMI 38.76 kg/m  Well-developed well-nourished patient in NAD. HEENT reveals PERLA, EOMI, discs not visualized.  Oral cavity is clear. No oral mucosal lesions are identified. Neck is clear without evidence of cervical or supraclavicular adenopathy. Lungs are clear to A&P. Cardiac examination is essentially unremarkable with regular rate and rhythm without murmur rub or thrill. Abdomen is benign with no organomegaly or masses noted. Motor sensory and DTR levels are equal and symmetric in the upper and lower extremities. Cranial nerves II through XII are grossly intact. Proprioception is intact. No peripheral adenopathy or edema is identified. No motor or sensory levels are  noted. Crude visual fields are within normal range.  RADIOLOGY RESULTS: Bone scan reviewed MRI of lumbar spine ordered  PLAN: This time of ordered an MRI of his lumbar spine. Should he have what appears to be metastatic involvement would offer palliative radiation therapy to this area. I will see him back right after his MRI for discussion of those results. He continues close follow-up care with Dr. Janese Banks who is entertaining possibly starting Provenge. He will keep his appoint with Dr. Janese Banks. Patient comprehends my recommendations and treatment plan well.  I would like to take this opportunity to thank you for allowing me to participate in the care of your patient.Noreene Filbert, MD

## 2020-08-27 DIAGNOSIS — M62830 Muscle spasm of back: Secondary | ICD-10-CM | POA: Diagnosis not present

## 2020-08-27 DIAGNOSIS — M9903 Segmental and somatic dysfunction of lumbar region: Secondary | ICD-10-CM | POA: Diagnosis not present

## 2020-08-27 DIAGNOSIS — M5416 Radiculopathy, lumbar region: Secondary | ICD-10-CM | POA: Diagnosis not present

## 2020-08-27 DIAGNOSIS — M9905 Segmental and somatic dysfunction of pelvic region: Secondary | ICD-10-CM | POA: Diagnosis not present

## 2020-08-28 ENCOUNTER — Other Ambulatory Visit: Payer: Self-pay | Admitting: *Deleted

## 2020-08-28 DIAGNOSIS — C61 Malignant neoplasm of prostate: Secondary | ICD-10-CM

## 2020-08-29 ENCOUNTER — Encounter: Payer: Self-pay | Admitting: Oncology

## 2020-08-29 ENCOUNTER — Inpatient Hospital Stay: Payer: Medicare Other | Attending: Radiation Oncology

## 2020-08-29 ENCOUNTER — Inpatient Hospital Stay (HOSPITAL_BASED_OUTPATIENT_CLINIC_OR_DEPARTMENT_OTHER): Payer: Medicare Other | Admitting: Oncology

## 2020-08-29 ENCOUNTER — Other Ambulatory Visit: Payer: Self-pay

## 2020-08-29 VITALS — BP 152/61 | HR 78 | Temp 96.2°F | Resp 16 | Wt 246.1 lb

## 2020-08-29 DIAGNOSIS — Z923 Personal history of irradiation: Secondary | ICD-10-CM | POA: Insufficient documentation

## 2020-08-29 DIAGNOSIS — C61 Malignant neoplasm of prostate: Secondary | ICD-10-CM | POA: Diagnosis not present

## 2020-08-29 DIAGNOSIS — I10 Essential (primary) hypertension: Secondary | ICD-10-CM | POA: Diagnosis not present

## 2020-08-29 DIAGNOSIS — C7951 Secondary malignant neoplasm of bone: Secondary | ICD-10-CM | POA: Diagnosis not present

## 2020-08-29 DIAGNOSIS — Z7189 Other specified counseling: Secondary | ICD-10-CM

## 2020-08-29 DIAGNOSIS — C779 Secondary and unspecified malignant neoplasm of lymph node, unspecified: Secondary | ICD-10-CM | POA: Diagnosis not present

## 2020-08-29 DIAGNOSIS — Z87891 Personal history of nicotine dependence: Secondary | ICD-10-CM | POA: Insufficient documentation

## 2020-08-29 LAB — COMPREHENSIVE METABOLIC PANEL
ALT: 20 U/L (ref 0–44)
AST: 22 U/L (ref 15–41)
Albumin: 4 g/dL (ref 3.5–5.0)
Alkaline Phosphatase: 40 U/L (ref 38–126)
Anion gap: 9 (ref 5–15)
BUN: 24 mg/dL — ABNORMAL HIGH (ref 8–23)
CO2: 26 mmol/L (ref 22–32)
Calcium: 8.7 mg/dL — ABNORMAL LOW (ref 8.9–10.3)
Chloride: 102 mmol/L (ref 98–111)
Creatinine, Ser: 1.14 mg/dL (ref 0.61–1.24)
GFR, Estimated: >60 mL/min (ref >60)
Glucose, Bld: 119 mg/dL — ABNORMAL HIGH (ref 70–99)
Potassium: 4.1 mmol/L (ref 3.5–5.1)
Sodium: 137 mmol/L (ref 135–145)
Total Bilirubin: 0.5 mg/dL (ref 0.3–1.2)
Total Protein: 6.9 g/dL (ref 6.5–8.1)

## 2020-08-29 LAB — CBC WITH DIFFERENTIAL/PLATELET
Abs Immature Granulocytes: 0.01 10*3/uL (ref 0.00–0.07)
Basophils Absolute: 0 10*3/uL (ref 0.0–0.1)
Basophils Relative: 0 %
Eosinophils Absolute: 0.2 10*3/uL (ref 0.0–0.5)
Eosinophils Relative: 3 %
HCT: 34.5 % — ABNORMAL LOW (ref 39.0–52.0)
Hemoglobin: 11.4 g/dL — ABNORMAL LOW (ref 13.0–17.0)
Immature Granulocytes: 0 %
Lymphocytes Relative: 12 %
Lymphs Abs: 0.7 10*3/uL (ref 0.7–4.0)
MCH: 31.8 pg (ref 26.0–34.0)
MCHC: 33 g/dL (ref 30.0–36.0)
MCV: 96.1 fL (ref 80.0–100.0)
Monocytes Absolute: 0.6 10*3/uL (ref 0.1–1.0)
Monocytes Relative: 10 %
Neutro Abs: 4.2 10*3/uL (ref 1.7–7.7)
Neutrophils Relative %: 75 %
Platelets: 187 10*3/uL (ref 150–400)
RBC: 3.59 MIL/uL — ABNORMAL LOW (ref 4.22–5.81)
RDW: 14.1 % (ref 11.5–15.5)
WBC: 5.7 10*3/uL (ref 4.0–10.5)
nRBC: 0 % (ref 0.0–0.2)

## 2020-08-29 LAB — PSA: Prostatic Specific Antigen: 6.54 ng/mL — ABNORMAL HIGH (ref 0.00–4.00)

## 2020-08-30 ENCOUNTER — Encounter: Payer: Self-pay | Admitting: Oncology

## 2020-08-30 NOTE — Progress Notes (Signed)
Hematology/Oncology Consult note [facility]  [phone] [phone]  Patient Care Team: Owens Loffler, MD as PCP - General Leandrew Koyanagi, MD as Referring Physician (Ophthalmology) Nickie Retort, MD as Consulting Physician (Urology) Noreene Filbert, MD as Radiation Oncologist (Radiation Oncology)   Name of the patient: Cesar Powell  [id number]  January 02, 1931   Date of visit: 08/30/20  Diagnosis- castrate resistant metastatic prostate cancer   Chief complaint/ Reason for visit-discuss bone scan results and further management  Heme/Onc history: patient is a 87 year old gentleman with no significant past medical problems. He has a history of prostate cancer that was diagnosed and treated with radiation in 2005. At that point he his PSA was about 0.03 for a long time. Then started on Axiron for low testosterone. PSA eventually went up to as high as 18 2015. At that point he was started on Lupron his PSAdoubling time is around 10 months.Most recently since October 2018 after his PSA went down from 3.6-1.7 and has been gradually increasing to 1.3,1.5and 2 indicating a PSAdoublingtime of 9.9 months.Testosterone levels continue to be suppressed.He has not started oral anti androgen therapy yet.  patient was last seen by me in September 2019 discussed adding oral antiandrogens like apalutamide versus enzalutamide to Lupron in castrate resistant nonmetastatic prostate cancer. After discussing risks and benefits patient did not wish to proceed.Patient stopped lupron infeb2020 but did restart after 1 year in February 2021.  Patient received IMRT for his pelvic lymph nodes.   Interval history-patient currently reports pain in his left lower extremity and is exercising to improve the strength.  He has also met with Dr. Donella Stade to discuss radiation to the L4 area.  He remains off ADT at this time  ECOG PS- 1 Pain scale-  3 Opioid associated constipation- no  Review of systems- Review of Systems  Constitutional: Positive for malaise/fatigue. Negative for chills, fever and weight loss.  HENT: Negative for congestion, ear discharge and nosebleeds.   Eyes: Negative for blurred vision.  Respiratory: Negative for cough, hemoptysis, sputum production, shortness of breath and wheezing.   Cardiovascular: Negative for chest pain, palpitations, orthopnea and claudication.  Gastrointestinal: Negative for abdominal pain, blood in stool, constipation, diarrhea, heartburn, melena, nausea and vomiting.  Genitourinary: Negative for dysuria, flank pain, frequency, hematuria and urgency.  Musculoskeletal: Negative for back pain, joint pain and myalgias.       Left lower extremity pain  Skin: Negative for rash.  Neurological: Negative for dizziness, tingling, focal weakness, seizures, weakness and headaches.  Endo/Heme/Allergies: Does not bruise/bleed easily.  Psychiatric/Behavioral: Negative for depression and suicidal ideas. The patient does not have insomnia.       No Known Allergies   Past Medical History:  Diagnosis Date  . H/O prostate cancer    was treated with radiation  . Hypertension   . Local recurrence of prostate cancer ([place]) 06/21/2004   Qualifier: Diagnosis of  By: Fuller Plan CMA (AAMA), Lugene    . Obstructive sleep apnea 06/22/2007   NPSG [place] 1979 Unattended Home sleep Study- 05/10/14- Confirms severe OSA, AHI 41.8/ hr, weight 230 pounds CPAP and also Transcend portable CPAP auto 8-15    . Sleep apnea 1977   uses C-pap      Past Surgical History:  Procedure Laterality Date  . ABDOMINAL SURGERY     ruptured intestines   . CATARACT EXTRACTION W/PHACO Left 01/29/2016   Procedure: CATARACT EXTRACTION PHACO AND INTRAOCULAR LENS PLACEMENT ([place]) left eye;  Surgeon:  Leandrew Koyanagi, MD;  Location: [place];  Service: Ophthalmology;  Laterality: Left;  RESTOR LENS  . CATARACT EXTRACTION  W/PHACO Right 10/21/2016   Procedure: CATARACT EXTRACTION PHACO AND INTRAOCULAR LENS PLACEMENT (IOC)  Right restor toric lens;  Surgeon: Leandrew Koyanagi, MD;  Location: [place];  Service: Ophthalmology;  Laterality: Right;  restor toric lens  . CYSTOSCOPY WITH LITHOLAPAXY N/A 08/14/2016   Procedure: CYSTOSCOPY WITH LITHOLAPAXY;  Surgeon: Nickie Retort, MD;  Location: [facility];  Service: Urology;  Laterality: N/A;  . FEMUR IM NAIL Right 10/08/2014   Procedure: INTRAMEDULLARY (IM) NAIL FEMORAL;  Surgeon: Rod Can, MD;  Location: [place];  Service: Orthopedics;  Laterality: Right;  PER DANIELLE 110 MIN  . HOLMIUM LASER APPLICATION  03/03/6552   Procedure: HOLMIUM LASER APPLICATION;  Surgeon: Nickie Retort, MD;  Location: [facility];  Service: Urology;;  . OTHER SURGICAL HISTORY  2006   Prostate Radiation Surgery   . RADIOACTIVE SEED IMPLANT N/A 10/30/2019   Procedure: RADIOACTIVE SEED IMPLANT/BRACHYTHERAPY IMPLANT;  Surgeon: Billey Co, MD;  Location: [facility];  Service: Urology;  Laterality: N/A;  41 seeds implanted    Social History   Socioeconomic History  . Marital status: Married    Spouse name: Not on file  . Number of children: Not on file  . Years of education: Not on file  . Highest education level: Not on file  Occupational History  . Occupation: retired  Tobacco Use  . Smoking status: Former Smoker    Packs/day: 1.00    Years: 15.00    Pack years: 15.00    Types: Cigarettes    Quit date: 09/14/1965    Years since quitting: 54.9  . Smokeless tobacco: Never Used  Vaping Use  . Vaping Use: Never used  Substance and Sexual Activity  . Alcohol use: Not Currently    Comment: occassiona;   [name] Drug use: No  . Sexual activity: Yes    Birth control/protection: None  Other Topics Concern  . Not on file  Social History Narrative  . Not on file   Social Determinants of Health   Financial Resource Strain: Not on file  Food Insecurity: Not on file   Transportation Needs: Not on file  Physical Activity: Not on file  Stress: Not on file  Social Connections: Not on file  Intimate Partner Violence: Not on file    Family History  Problem Relation Age of Onset  . Heart disease Brother   . Heart attack Brother   . Lung cancer Brother   . Prostate cancer Neg Hx   . Bladder Cancer Neg Hx   . Kidney cancer Neg Hx      Current Outpatient Medications:  .  ascorbic acid (VITAMIN C) 500 MG tablet, Take 1,000 mg by mouth daily., Disp: , Rfl:  .  Cholecalciferol (VITAMIN D3) 50 MCG (2000 UT) TABS, Take 2,000 Units by mouth daily., Disp: , Rfl:  .  Misc Natural Products (IMMUNE FORMULA PO), Take 2 capsules by mouth daily. Nutriferon by [employer], Disp: , Rfl:  .  Misc Natural Products (JOINT HEALTH PO), Take 2 tablets by mouth daily., Disp: , Rfl:  .  Multiple Vitamin (MULTIVITAMIN WITH MINERALS) TABS tablet, Take 2 tablets by mouth daily. Vita-Lea, Disp: , Rfl:  .  Multiple Vitamins-Minerals (ZINC PO), Take 1 tablet by mouth daily., Disp: , Rfl:  .  Nutritional Supplements (IMMUNE ENHANCE) TABS, Take 1 tablet by mouth daily., Disp: , Rfl:  .  omega-3 fish oil (MAXEPA) 1000 MG CAPS capsule, Take 1 capsule by mouth., Disp: , Rfl:  .  Probiotic Product (PROBIOTIC PO), Take 1 capsule by mouth daily., Disp: , Rfl:  .  RESVERATROL PO, Take 2 capsules by mouth daily. Vivix, Disp: , Rfl:  .  tamsulosin (FLOMAX) 0.4 MG CAPS capsule, TAKE 2 CAPSULES BY MOUTH EVERY DAY, Disp: 180 capsule, Rfl: 3 .  TURMERIC PO, Take 1 capsule by mouth daily., Disp: , Rfl:   Physical exam:  Vitals:   08/29/20 0932  BP: (!) 152/61  Pulse: 78  Resp: 16  Temp: (!) 96.2 F (35.7 C)  TempSrc: Tympanic  SpO2: 98%  Weight: 246 lb 1.6 oz (111.6 kg)   Physical Exam Constitutional:      General: He is not in acute distress. Eyes:     Extraocular Movements: EOM normal.  Cardiovascular:     Rate and Rhythm: Normal rate and regular rhythm.     Heart sounds: Normal  heart sounds.  Pulmonary:     Effort: Pulmonary effort is normal.     Breath sounds: Normal breath sounds.  Abdominal:     General: Bowel sounds are normal.     Palpations: Abdomen is soft.  Skin:    General: Skin is warm and dry.  Neurological:     Mental Status: He is alert and oriented to person, place, and time.      CMP Latest Ref Rng & Units 08/29/2020  Glucose 70 - 99 mg/dL 119(H)  BUN 8 - 23 mg/dL 24(H)  Creatinine 0.61 - 1.24 mg/dL 1.14  Sodium 135 - 145 mmol/L 137  Potassium 3.5 - 5.1 mmol/L 4.1  Chloride 98 - 111 mmol/L 102  CO2 22 - 32 mmol/L 26  Calcium 8.9 - 10.3 mg/dL 8.7(L)  Total Protein 6.5 - 8.1 g/dL 6.9  Total Bilirubin 0.3 - 1.2 mg/dL 0.5  Alkaline Phos 38 - 126 U/L 40  AST 15 - 41 U/L 22  ALT 0 - 44 U/L 20   CBC Latest Ref Rng & Units 08/29/2020  WBC 4.0 - 10.5 K/uL 5.7  Hemoglobin 13.0 - 17.0 g/dL 11.4(L)  Hematocrit 39.0 - 52.0 % 34.5(L)  Platelets 150 - 400 K/uL 187     Assessment and plan- Patient is a 87 y.o. male with history of castrate resistant metastatic prostate cancer to lymph nodes and now to bone here for routine follow-up  Patient stopped taking Eligard in February 2021.  His PSA came down to 1.87 7 months ago and then went up to 2.14 when he came off Eligard.  PSA levels from today are pending but after the patient left the clinic it came back elevated at 6.54.  I discussed the results of the bone scan which shows new abnormal uptake in the L4 lumbar spine area which may explain his left lower extremity pain.  He also has some possible uptake in the left first and second rib.  Discussed with the patient that while radiation can help him with the left lower extremity pain if he received radiation to the L4 area, we still need to address his systemic disease as a whole.  With the present trend of rising PSA I would strongly recommend for him to go back on Eligard.  Previously his PSA doubling time was about 10 months but presently it is  around 2 months.  Patient remains hesitant to go back on Eligard at this time.I do not see much benefit with Provenge in this  setting when patient does not wish to take ADT which is the backbone of prostate cancer treatment.  Patient would like to think about his options and get back to me.  He has received Eligard with urology in the past.  CBC with differential CMP and PSA in 3 months in 6 months and I will see him back in 6 months.  Despite being 87 years of age patient is fit and can tolerate treatment.   Visit Diagnosis 1. Prostate cancer [facility])      Dr. Randa Evens, MD, MPH [facility] at [facility] [phone] 08/30/2020 10:03 AM

## 2020-08-31 ENCOUNTER — Other Ambulatory Visit: Payer: Self-pay

## 2020-08-31 ENCOUNTER — Ambulatory Visit
Admission: RE | Admit: 2020-08-31 | Discharge: 2020-08-31 | Disposition: A | Discharge status: Home / Self Care | Payer: Medicare Other | Source: Ambulatory Visit | Attending: Radiation Oncology | Admitting: Radiation Oncology

## 2020-08-31 ENCOUNTER — Ambulatory Visit: Admission: RE | Admit: 2020-08-31 | Payer: Medicare Other | Source: Ambulatory Visit

## 2020-08-31 DIAGNOSIS — M545 Low back pain, unspecified: Secondary | ICD-10-CM | POA: Diagnosis not present

## 2020-08-31 DIAGNOSIS — C61 Malignant neoplasm of prostate: Secondary | ICD-10-CM | POA: Diagnosis not present

## 2020-08-31 MED ORDER — GADOBUTROL 1 MMOL/ML IV SOLN
10.0000 mL | Freq: Once | INTRAVENOUS | Status: AC | PRN
  Administered 2020-08-31: 10 mL via INTRAVENOUS

## 2020-09-02 ENCOUNTER — Other Ambulatory Visit: Payer: Self-pay

## 2020-09-02 ENCOUNTER — Encounter: Payer: Self-pay | Admitting: Radiation Oncology

## 2020-09-02 ENCOUNTER — Ambulatory Visit
Admission: RE | Admit: 2020-09-02 | Discharge: 2020-09-02 | Disposition: A | Discharge status: Home / Self Care | Payer: Medicare Other | Source: Ambulatory Visit | Attending: Radiation Oncology | Admitting: Radiation Oncology

## 2020-09-02 VITALS — BP 161/66 | HR 59 | Temp 94.8°F | Wt 249.0 lb

## 2020-09-02 DIAGNOSIS — Z923 Personal history of irradiation: Secondary | ICD-10-CM | POA: Diagnosis not present

## 2020-09-02 DIAGNOSIS — C61 Malignant neoplasm of prostate: Secondary | ICD-10-CM | POA: Diagnosis not present

## 2020-09-02 NOTE — Progress Notes (Signed)
Radiation Oncology Follow up Note  Name: Cesar Powell   Date:   09/02/2020 MRN:  [id number] DOB: 1931/02/05    This 87 y.o. male presents to the clinic today for follow-up of MRI of spine and patient with rising PSA who had a recent bone scan showing new foci of abnormal uptake at L4 level.  REFERRING PROVIDER: Owens Loffler, MD  HPI: Patient is an 87 year old male who we treated with both external beam radiation therapy as well as I-125 interstitial implant for adenocarcinoma the prostate.  His PSA continues to rise he had a bone scan showing up abnormal uptake at L4 level.[name]  MRI scan was performed which was negative for metastatic disease in the lumbar spine had post radiation changes involving L4 and L5 and sacrum.  He did have some endplate deformities N8-2 with mild enhancement likely due to subacute endplate fractures.  His most recent PSA again has risen to 6.5.  Patient is really having no bone pain at this time.  And he is consistently refused Eligard treatment.  COMPLICATIONS OF TREATMENT: none  FOLLOW UP COMPLIANCE: keeps appointments   PHYSICAL EXAM:  BP (!) 161/66   Pulse (!) 59   Temp (!) 94.8 F (34.9 C) (Tympanic)   Wt 249 lb (112.9 kg)   BMI 39.59 kg/m  Well-developed well-nourished patient in NAD. HEENT reveals PERLA, EOMI, discs not visualized.  Oral cavity is clear. No oral mucosal lesions are identified. Neck is clear without evidence of cervical or supraclavicular adenopathy. Lungs are clear to A&P. Cardiac examination is essentially unremarkable with regular rate and rhythm without murmur rub or thrill. Abdomen is benign with no organomegaly or masses noted. Motor sensory and DTR levels are equal and symmetric in the upper and lower extremities. Cranial nerves II through XII are grossly intact. Proprioception is intact. No peripheral adenopathy or edema is identified. No motor or sensory levels are noted. Crude visual fields are within normal range.  RADIOLOGY  RESULTS: MRI scan reviewed and compared with his bone scan.  PLAN: At this time patient needs ADT therapy although refuses Eligard and the supply of Lupron is scarce.  I am referring her back to Dr. Janese Banks may be one of the oral agents can be used in his case which he might except as treatment.  I have otherwise asked to see him back in 3 to 4 months for follow-up.  Patient agrees to follow-up with Dr. Janese Banks.  I would like to take this opportunity to thank you for allowing me to participate in the care of your patient.Noreene Filbert, MD

## 2020-09-18 ENCOUNTER — Telehealth: Payer: Self-pay | Admitting: Oncology

## 2020-09-18 NOTE — Telephone Encounter (Signed)
Patient returned phone call about r/s appt on 3/3. Patient agreeable to r/s on 3/15. Stated that he has MyChart and does not need AVS in the mail.

## 2020-09-24 DIAGNOSIS — M5416 Radiculopathy, lumbar region: Secondary | ICD-10-CM | POA: Diagnosis not present

## 2020-09-24 DIAGNOSIS — M9903 Segmental and somatic dysfunction of lumbar region: Secondary | ICD-10-CM | POA: Diagnosis not present

## 2020-09-24 DIAGNOSIS — M62830 Muscle spasm of back: Secondary | ICD-10-CM | POA: Diagnosis not present

## 2020-09-24 DIAGNOSIS — M9905 Segmental and somatic dysfunction of pelvic region: Secondary | ICD-10-CM | POA: Diagnosis not present

## 2020-10-03 ENCOUNTER — Ambulatory Visit: Payer: Medicare Other | Admitting: Oncology

## 2020-10-03 ENCOUNTER — Other Ambulatory Visit: Payer: Medicare Other

## 2020-10-15 ENCOUNTER — Inpatient Hospital Stay: Payer: Medicare Other | Attending: Radiation Oncology

## 2020-10-15 ENCOUNTER — Inpatient Hospital Stay (HOSPITAL_BASED_OUTPATIENT_CLINIC_OR_DEPARTMENT_OTHER): Payer: Medicare Other | Admitting: Oncology

## 2020-10-15 ENCOUNTER — Encounter: Payer: Self-pay | Admitting: Oncology

## 2020-10-15 VITALS — BP 131/61 | HR 56 | Temp 96.1°F | Resp 18 | Wt 233.2 lb

## 2020-10-15 DIAGNOSIS — Z923 Personal history of irradiation: Secondary | ICD-10-CM | POA: Diagnosis not present

## 2020-10-15 DIAGNOSIS — Z87891 Personal history of nicotine dependence: Secondary | ICD-10-CM | POA: Diagnosis not present

## 2020-10-15 DIAGNOSIS — C61 Malignant neoplasm of prostate: Secondary | ICD-10-CM | POA: Insufficient documentation

## 2020-10-15 LAB — CBC WITH DIFFERENTIAL/PLATELET
Abs Immature Granulocytes: 0.05 10*3/uL (ref 0.00–0.07)
Basophils Absolute: 0 10*3/uL (ref 0.0–0.1)
Basophils Relative: 0 %
Eosinophils Absolute: 0.2 10*3/uL (ref 0.0–0.5)
Eosinophils Relative: 3 %
HCT: 33 % — ABNORMAL LOW (ref 39.0–52.0)
Hemoglobin: 10.8 g/dL — ABNORMAL LOW (ref 13.0–17.0)
Immature Granulocytes: 1 %
Lymphocytes Relative: 11 %
Lymphs Abs: 0.6 10*3/uL — ABNORMAL LOW (ref 0.7–4.0)
MCH: 30.9 pg (ref 26.0–34.0)
MCHC: 32.7 g/dL (ref 30.0–36.0)
MCV: 94.3 fL (ref 80.0–100.0)
Monocytes Absolute: 0.6 10*3/uL (ref 0.1–1.0)
Monocytes Relative: 11 %
Neutro Abs: 3.9 10*3/uL (ref 1.7–7.7)
Neutrophils Relative %: 74 %
Platelets: 203 10*3/uL (ref 150–400)
RBC: 3.5 MIL/uL — ABNORMAL LOW (ref 4.22–5.81)
RDW: 14.1 % (ref 11.5–15.5)
WBC: 5.3 10*3/uL (ref 4.0–10.5)
nRBC: 0 % (ref 0.0–0.2)

## 2020-10-15 LAB — PSA: Prostatic Specific Antigen: 7.45 ng/mL — ABNORMAL HIGH (ref 0.00–4.00)

## 2020-10-15 LAB — COMPREHENSIVE METABOLIC PANEL
ALT: 19 U/L (ref 0–44)
AST: 18 U/L (ref 15–41)
Albumin: 4.1 g/dL (ref 3.5–5.0)
Alkaline Phosphatase: 43 U/L (ref 38–126)
Anion gap: 11 (ref 5–15)
BUN: 27 mg/dL — ABNORMAL HIGH (ref 8–23)
CO2: 25 mmol/L (ref 22–32)
Calcium: 9.3 mg/dL (ref 8.9–10.3)
Chloride: 101 mmol/L (ref 98–111)
Creatinine, Ser: 1.24 mg/dL (ref 0.61–1.24)
GFR, Estimated: 56 mL/min — ABNORMAL LOW (ref >60)
Glucose, Bld: 116 mg/dL — ABNORMAL HIGH (ref 70–99)
Potassium: 4.7 mmol/L (ref 3.5–5.1)
Sodium: 137 mmol/L (ref 135–145)
Total Bilirubin: 0.6 mg/dL (ref 0.3–1.2)
Total Protein: 7.3 g/dL (ref 6.5–8.1)

## 2020-10-16 NOTE — Progress Notes (Signed)
Hematology/Oncology Consult note [facility]  Telephone:([phone] [phone]  Patient Care Team: Owens Loffler, MD as PCP - General Leandrew Koyanagi, MD as Referring Physician (Ophthalmology) Nickie Retort, MD as Consulting Physician (Urology) Noreene Filbert, MD as Radiation Oncologist (Radiation Oncology)   Name of the patient: Cesar Powell  [id number]  1931-07-13   Date of visit: 10/16/20  Diagnosis- castrate resistant metastatic prostate cancer  Chief complaint/ Reason for visit-routine follow-up of prostate cancer   Heme/Onc history: patient is a 87 year old gentleman with no significant past medical problems. He has a history of prostate cancer that was diagnosed and treated with radiation in 2005. At that point he his PSA was about 0.03 for a long time. Then started on Axiron for low testosterone. PSA eventually went up to as high as 18 2015. At that point he was started on Lupron his PSAdoubling time is around 10 months.Most recently since October 2018 after his PSA went down from 3.6-1.7 and has been gradually increasing to 1.3,1.5and 2 indicating a PSAdoublingtime of 9.9 months.Testosterone levels continue to be suppressed.Hehas not started oral anti androgen therapy yet.  patient was last seen by me in September 2019 discussed adding oral antiandrogens like apalutamide versus enzalutamide to Lupron in castrate resistant nonmetastatic prostate cancer. After discussing risks and benefits patient did not wish to proceed.Patient stopped lupron infeb2020 but did restart after 1 year in February 2021.Patient received IMRT for his pelvic lymph nodes.  Interval history-patient reports doing well in terms of his health.  Back pain and left leg pain has almost resolved.  He is active and works out at [employer].  ECOG PS- 1 Pain scale- 0   Review of systems- Review of Systems  Constitutional: Negative for  chills, fever, malaise/fatigue and weight loss.  HENT: Negative for congestion, ear discharge and nosebleeds.   Eyes: Negative for blurred vision.  Respiratory: Negative for cough, hemoptysis, sputum production, shortness of breath and wheezing.   Cardiovascular: Negative for chest pain, palpitations, orthopnea and claudication.  Gastrointestinal: Negative for abdominal pain, blood in stool, constipation, diarrhea, heartburn, melena, nausea and vomiting.  Genitourinary: Negative for dysuria, flank pain, frequency, hematuria and urgency.  Musculoskeletal: Negative for back pain, joint pain and myalgias.  Skin: Negative for rash.  Neurological: Negative for dizziness, tingling, focal weakness, seizures, weakness and headaches.  Endo/Heme/Allergies: Does not bruise/bleed easily.  Psychiatric/Behavioral: Negative for depression and suicidal ideas. The patient does not have insomnia.       No Known Allergies   Past Medical History:  Diagnosis Date  . H/O prostate cancer    was treated with radiation  . Hypertension   . Local recurrence of prostate cancer ([place]) 06/21/2004   Qualifier: Diagnosis of  By: Fuller Plan CMA (AAMA), Lugene    . Obstructive sleep apnea 06/22/2007   NPSG [place] 1979 Unattended Home sleep Study- 05/10/14- Confirms severe OSA, AHI 41.8/ hr, weight 230 pounds CPAP and also Transcend portable CPAP auto 8-15    . Sleep apnea 1977   uses C-pap      Past Surgical History:  Procedure Laterality Date  . ABDOMINAL SURGERY     ruptured intestines   . CATARACT EXTRACTION W/PHACO Left 01/29/2016   Procedure: CATARACT EXTRACTION PHACO AND INTRAOCULAR LENS PLACEMENT ([place]) left eye;  Surgeon: Leandrew Koyanagi, MD;  Location: [place];  Service: Ophthalmology;  Laterality: Left;  RESTOR LENS  . CATARACT EXTRACTION W/PHACO Right 10/21/2016   Procedure: CATARACT EXTRACTION PHACO AND  INTRAOCULAR LENS PLACEMENT (IOC)  Right restor toric lens;  Surgeon: Leandrew Koyanagi, MD;  Location: [place];  Service: Ophthalmology;  Laterality: Right;  restor toric lens  . CYSTOSCOPY WITH LITHOLAPAXY N/A 08/14/2016   Procedure: CYSTOSCOPY WITH LITHOLAPAXY;  Surgeon: Nickie Retort, MD;  Location: [facility];  Service: Urology;  Laterality: N/A;  . FEMUR IM NAIL Right 10/08/2014   Procedure: INTRAMEDULLARY (IM) NAIL FEMORAL;  Surgeon: Rod Can, MD;  Location: [place];  Service: Orthopedics;  Laterality: Right;  PER DANIELLE 110 MIN  . HOLMIUM LASER APPLICATION  04/25/3006   Procedure: HOLMIUM LASER APPLICATION;  Surgeon: Nickie Retort, MD;  Location: [facility];  Service: Urology;;  . OTHER SURGICAL HISTORY  2006   Prostate Radiation Surgery   . RADIOACTIVE SEED IMPLANT N/A 10/30/2019   Procedure: RADIOACTIVE SEED IMPLANT/BRACHYTHERAPY IMPLANT;  Surgeon: Billey Co, MD;  Location: [facility];  Service: Urology;  Laterality: N/A;  41 seeds implanted    Social History   Socioeconomic History  . Marital status: Married    Spouse name: Not on file  . Number of children: Not on file  . Years of education: Not on file  . Highest education level: Not on file  Occupational History  . Occupation: retired  Tobacco Use  . Smoking status: Former Smoker    Packs/day: 1.00    Years: 15.00    Pack years: 15.00    Types: Cigarettes    Quit date: 09/14/1965    Years since quitting: 55.1  . Smokeless tobacco: Never Used  Vaping Use  . Vaping Use: Never used  Substance and Sexual Activity  . Alcohol use: Not Currently    Comment: occassiona;   [name] Drug use: No  . Sexual activity: Yes    Birth control/protection: None  Other Topics Concern  . Not on file  Social History Narrative  . Not on file   Social Determinants of Health   Financial Resource Strain: Not on file  Food Insecurity: Not on file  Transportation Needs: Not on file  Physical Activity: Not on file  Stress: Not on file  Social Connections: Not on file  Intimate Partner  Violence: Not on file    Family History  Problem Relation Age of Onset  . Heart disease Brother   . Heart attack Brother   . Lung cancer Brother   . Prostate cancer Neg Hx   . Bladder Cancer Neg Hx   . Kidney cancer Neg Hx      Current Outpatient Medications:  .  ascorbic acid (VITAMIN C) 500 MG tablet, Take 1,000 mg by mouth daily., Disp: , Rfl:  .  Cholecalciferol (VITAMIN D3) 50 MCG (2000 UT) TABS, Take 2,000 Units by mouth daily., Disp: , Rfl:  .  Misc Natural Products (IMMUNE FORMULA PO), Take 2 capsules by mouth daily. Nutriferon by [employer], Disp: , Rfl:  .  Misc Natural Products (JOINT HEALTH PO), Take 2 tablets by mouth daily., Disp: , Rfl:  .  Multiple Vitamin (MULTIVITAMIN WITH MINERALS) TABS tablet, Take 2 tablets by mouth daily. Vita-Lea, Disp: , Rfl:  .  Multiple Vitamins-Minerals (ZINC PO), Take 1 tablet by mouth daily., Disp: , Rfl:  .  Nutritional Supplements (IMMUNE ENHANCE) TABS, Take 1 tablet by mouth daily., Disp: , Rfl:  .  Probiotic Product (PROBIOTIC PO), Take 1 capsule by mouth daily., Disp: , Rfl:  .  RESVERATROL PO, Take 2 capsules by mouth daily. Vivix, Disp: , Rfl:  .  tamsulosin (FLOMAX) 0.4 MG CAPS capsule, TAKE 2 CAPSULES BY MOUTH EVERY DAY, Disp: 180 capsule, Rfl: 3 .  TURMERIC PO, Take 1 capsule by mouth daily., Disp: , Rfl:  .  omega-3 fish oil (MAXEPA) 1000 MG CAPS capsule, Take 1 capsule by mouth. (Patient not taking: Reported on 10/15/2020), Disp: , Rfl:   Physical exam:  Vitals:   10/15/20 1022  BP: 131/61  Pulse: (!) 56  Resp: 18  Temp: (!) 96.1 F (35.6 C)  TempSrc: Tympanic  SpO2: 100%  Weight: 233 lb 3.2 oz (105.8 kg)   Physical Exam Cardiovascular:     Rate and Rhythm: Normal rate and regular rhythm.     Heart sounds: Normal heart sounds.  Pulmonary:     Effort: Pulmonary effort is normal.     Breath sounds: Normal breath sounds.  Skin:    General: Skin is warm and dry.  Neurological:     Mental Status: He is alert and  oriented to person, place, and time.      CMP Latest Ref Rng & Units 10/15/2020  Glucose 70 - 99 mg/dL 116(H)  BUN 8 - 23 mg/dL 27(H)  Creatinine 0.61 - 1.24 mg/dL 1.24  Sodium 135 - 145 mmol/L 137  Potassium 3.5 - 5.1 mmol/L 4.7  Chloride 98 - 111 mmol/L 101  CO2 22 - 32 mmol/L 25  Calcium 8.9 - 10.3 mg/dL 9.3  Total Protein 6.5 - 8.1 g/dL 7.3  Total Bilirubin 0.3 - 1.2 mg/dL 0.6  Alkaline Phos 38 - 126 U/L 43  AST 15 - 41 U/L 18  ALT 0 - 44 U/L 19   CBC Latest Ref Rng & Units 10/15/2020  WBC 4.0 - 10.5 K/uL 5.3  Hemoglobin 13.0 - 17.0 g/dL 10.8(L)  Hematocrit 39.0 - 52.0 % 33.0(L)  Platelets 150 - 400 K/uL 203    Assessment and plan- Patient is a 87 y.o. male with castrate sensitive metastatic prostate cancer here to discuss further management  Patient had his last [facility] PET scan in December 2020 when he was noted to have tracer avid distal retroperitoneal common iliac and right external iliac lymph node involvement.  He did receive radiation to that area in the past since he did not wish to get ADT.  Patient has remained off ADT now since February 2021.  His PSA had gone down to 1.87 in June 2021 but since then has been gradually increasing and presently at 7.45.  Patient is not interested in pursuing ADT.  Plan to the patient that additional antiandrogen therapy such as abiraterone or enzalutamide are given in conjunction with Lupron and not by themselves and also have similar antiandrogenic side effects.  Options such as Provenge can be used in castrate resistant setting but also require a backbone of ADT.  At this time I would like to refer him to [facility] for a second opinion with medical oncology.  CBC with differential CMP and PSA in 3 in 6 months and I will see him back in 6 months but sooner based on [facility] opinion   Visit Diagnosis 1. Prostate cancer [facility])      Dr. Randa Evens, MD, MPH [facility] at [facility] [phone] 10/16/2020 1:47 PM

## 2020-10-16 NOTE — Addendum Note (Signed)
Addended by: Kern Alberta on: 10/16/2020 02:57 PM   Modules accepted: Orders

## 2020-10-22 DIAGNOSIS — M5416 Radiculopathy, lumbar region: Secondary | ICD-10-CM | POA: Diagnosis not present

## 2020-10-22 DIAGNOSIS — M9905 Segmental and somatic dysfunction of pelvic region: Secondary | ICD-10-CM | POA: Diagnosis not present

## 2020-10-22 DIAGNOSIS — M62830 Muscle spasm of back: Secondary | ICD-10-CM | POA: Diagnosis not present

## 2020-10-22 DIAGNOSIS — M9903 Segmental and somatic dysfunction of lumbar region: Secondary | ICD-10-CM | POA: Diagnosis not present

## 2020-11-19 DIAGNOSIS — M9903 Segmental and somatic dysfunction of lumbar region: Secondary | ICD-10-CM | POA: Diagnosis not present

## 2020-11-19 DIAGNOSIS — M62830 Muscle spasm of back: Secondary | ICD-10-CM | POA: Diagnosis not present

## 2020-11-19 DIAGNOSIS — M9905 Segmental and somatic dysfunction of pelvic region: Secondary | ICD-10-CM | POA: Diagnosis not present

## 2020-11-19 DIAGNOSIS — M5136 Other intervertebral disc degeneration, lumbar region: Secondary | ICD-10-CM | POA: Diagnosis not present

## 2020-11-20 DIAGNOSIS — C772 Secondary and unspecified malignant neoplasm of intra-abdominal lymph nodes: Secondary | ICD-10-CM | POA: Diagnosis not present

## 2020-11-20 DIAGNOSIS — C61 Malignant neoplasm of prostate: Secondary | ICD-10-CM | POA: Diagnosis not present

## 2020-11-27 ENCOUNTER — Other Ambulatory Visit: Payer: Medicare Other

## 2020-12-17 DIAGNOSIS — M5136 Other intervertebral disc degeneration, lumbar region: Secondary | ICD-10-CM | POA: Diagnosis not present

## 2020-12-17 DIAGNOSIS — M9903 Segmental and somatic dysfunction of lumbar region: Secondary | ICD-10-CM | POA: Diagnosis not present

## 2020-12-17 DIAGNOSIS — M9905 Segmental and somatic dysfunction of pelvic region: Secondary | ICD-10-CM | POA: Diagnosis not present

## 2020-12-17 DIAGNOSIS — M62830 Muscle spasm of back: Secondary | ICD-10-CM | POA: Diagnosis not present

## 2020-12-23 ENCOUNTER — Ambulatory Visit
Admission: RE | Admit: 2020-12-23 | Discharge: 2020-12-23 | Disposition: A | Discharge status: Home / Self Care | Payer: Medicare Other | Source: Ambulatory Visit | Attending: Radiation Oncology | Admitting: Radiation Oncology

## 2020-12-23 ENCOUNTER — Encounter: Payer: Self-pay | Admitting: Radiation Oncology

## 2020-12-23 VITALS — BP 145/64 | HR 59 | Temp 97.8°F | Wt 226.0 lb

## 2020-12-23 DIAGNOSIS — R9721 Rising PSA following treatment for malignant neoplasm of prostate: Secondary | ICD-10-CM | POA: Insufficient documentation

## 2020-12-23 DIAGNOSIS — C61 Malignant neoplasm of prostate: Secondary | ICD-10-CM | POA: Diagnosis not present

## 2020-12-23 DIAGNOSIS — Z923 Personal history of irradiation: Secondary | ICD-10-CM | POA: Insufficient documentation

## 2020-12-23 NOTE — Progress Notes (Signed)
Radiation Oncology Follow up Note  Name: Cesar Powell   Date:   12/23/2020 MRN:  [id number] DOB: [birth year]     REFERRING PROVIDER: Owens Loffler, MD  HPI: This 87 y.o. male presents to the clinic today for 58-month follow-up status post salvage radiation therapy to both EXTR pelvic lymph nodes as well as I-125 interstitial implant for adenocarcinoma the prostate with biochemical failure.  He is seen today in routine follow-up is doing well symptomatically he is starting to have some slight stress incontinence.  No specific urgency or frequency.  His PSA is trending upwards most recently 2 months ago at 7.4 slightly up from 6.7 a year ago.  He had a lesion in his lumbar spine MRI did not confirm this to be malignancy.  He is under the care of Dr. Janese Banks at this point he continues to decline Lupron therapy.  He did go to [facility] and they recommended no further treatment until his PSA is over 20. [name]  COMPLICATIONS OF TREATMENT: none  FOLLOW UP COMPLIANCE: keeps appointments   PHYSICAL EXAM:  BP (!) 145/64   Pulse (!) 59   Temp 97.8 F (36.6 C) (Tympanic)   Wt 226 lb (102.5 kg)   BMI 35.93 kg/m  Well-developed well-nourished patient in NAD. HEENT reveals PERLA, EOMI, discs not visualized.  Oral cavity is clear. No oral mucosal lesions are identified. Neck is clear without evidence of cervical or supraclavicular adenopathy. Lungs are clear to A&P. Cardiac examination is essentially unremarkable with regular rate and rhythm without murmur rub or thrill. Abdomen is benign with no organomegaly or masses noted. Motor sensory and DTR levels are equal and symmetric in the upper and lower extremities. Cranial nerves II through XII are grossly intact. Proprioception is intact. No peripheral adenopathy or edema is identified. No motor or sensory levels are noted. Crude visual fields are within normal range.  RADIOLOGY RESULTS: MRI of his spine reviewed negative for metastatic disease  PLAN: Present  time patient has a slowly rising PSA.  Based on his age and his refusal to start androgen deprivation therapy again he continues to be observed by Dr. Janese Banks.  I have asked to see him back in 6 months for follow-up.  Patient knows to call with any concerns.  He continues close follow-up care with medical oncology.  I would like to take this opportunity to thank you for allowing me to participate in the care of your patient.Noreene Filbert, MD

## 2021-01-14 DIAGNOSIS — M9903 Segmental and somatic dysfunction of lumbar region: Secondary | ICD-10-CM | POA: Diagnosis not present

## 2021-01-14 DIAGNOSIS — M5136 Other intervertebral disc degeneration, lumbar region: Secondary | ICD-10-CM | POA: Diagnosis not present

## 2021-01-14 DIAGNOSIS — M9905 Segmental and somatic dysfunction of pelvic region: Secondary | ICD-10-CM | POA: Diagnosis not present

## 2021-01-14 DIAGNOSIS — M62830 Muscle spasm of back: Secondary | ICD-10-CM | POA: Diagnosis not present

## 2021-01-15 ENCOUNTER — Inpatient Hospital Stay: Payer: Medicare Other | Attending: Oncology

## 2021-01-15 ENCOUNTER — Other Ambulatory Visit: Payer: Self-pay

## 2021-01-15 DIAGNOSIS — C61 Malignant neoplasm of prostate: Secondary | ICD-10-CM | POA: Diagnosis not present

## 2021-01-15 LAB — CBC WITH DIFFERENTIAL/PLATELET
Abs Immature Granulocytes: 0.02 10*3/uL (ref 0.00–0.07)
Basophils Absolute: 0 10*3/uL (ref 0.0–0.1)
Basophils Relative: 0 %
Eosinophils Absolute: 0.2 10*3/uL (ref 0.0–0.5)
Eosinophils Relative: 3 %
HCT: 32.7 % — ABNORMAL LOW (ref 39.0–52.0)
Hemoglobin: 11 g/dL — ABNORMAL LOW (ref 13.0–17.0)
Immature Granulocytes: 0 %
Lymphocytes Relative: 14 %
Lymphs Abs: 0.8 10*3/uL (ref 0.7–4.0)
MCH: 31.8 pg (ref 26.0–34.0)
MCHC: 33.6 g/dL (ref 30.0–36.0)
MCV: 94.5 fL (ref 80.0–100.0)
Monocytes Absolute: 0.6 10*3/uL (ref 0.1–1.0)
Monocytes Relative: 10 %
Neutro Abs: 4.2 10*3/uL (ref 1.7–7.7)
Neutrophils Relative %: 73 %
Platelets: 188 10*3/uL (ref 150–400)
RBC: 3.46 MIL/uL — ABNORMAL LOW (ref 4.22–5.81)
RDW: 14 % (ref 11.5–15.5)
WBC: 5.7 10*3/uL (ref 4.0–10.5)
nRBC: 0 % (ref 0.0–0.2)

## 2021-01-15 LAB — PSA: Prostatic Specific Antigen: 10.2 ng/mL — ABNORMAL HIGH (ref 0.00–4.00)

## 2021-01-15 LAB — COMPREHENSIVE METABOLIC PANEL
ALT: 17 U/L (ref 0–44)
AST: 18 U/L (ref 15–41)
Albumin: 4.2 g/dL (ref 3.5–5.0)
Alkaline Phosphatase: 51 U/L (ref 38–126)
Anion gap: 8 (ref 5–15)
BUN: 20 mg/dL (ref 8–23)
CO2: 28 mmol/L (ref 22–32)
Calcium: 9.3 mg/dL (ref 8.9–10.3)
Chloride: 102 mmol/L (ref 98–111)
Creatinine, Ser: 1.1 mg/dL (ref 0.61–1.24)
GFR, Estimated: >60 mL/min (ref >60)
Glucose, Bld: 106 mg/dL — ABNORMAL HIGH (ref 70–99)
Potassium: 4.6 mmol/L (ref 3.5–5.1)
Sodium: 138 mmol/L (ref 135–145)
Total Bilirubin: 0.6 mg/dL (ref 0.3–1.2)
Total Protein: 7.2 g/dL (ref 6.5–8.1)

## 2021-01-23 ENCOUNTER — Other Ambulatory Visit: Payer: Self-pay

## 2021-01-23 DIAGNOSIS — C61 Malignant neoplasm of prostate: Secondary | ICD-10-CM

## 2021-01-24 ENCOUNTER — Other Ambulatory Visit: Payer: Medicare Other

## 2021-01-24 ENCOUNTER — Other Ambulatory Visit: Payer: Self-pay

## 2021-01-24 DIAGNOSIS — C61 Malignant neoplasm of prostate: Secondary | ICD-10-CM

## 2021-01-25 LAB — PSA: Prostate Specific Ag, Serum: 10.1 ng/mL — ABNORMAL HIGH (ref 0.0–4.0)

## 2021-01-27 ENCOUNTER — Ambulatory Visit: Payer: Self-pay | Admitting: Urology

## 2021-01-29 ENCOUNTER — Other Ambulatory Visit: Payer: Self-pay

## 2021-01-29 ENCOUNTER — Ambulatory Visit: Payer: Medicare Other | Admitting: Urology

## 2021-01-29 ENCOUNTER — Encounter: Payer: Self-pay | Admitting: Urology

## 2021-02-11 DIAGNOSIS — M9905 Segmental and somatic dysfunction of pelvic region: Secondary | ICD-10-CM | POA: Diagnosis not present

## 2021-02-11 DIAGNOSIS — M62830 Muscle spasm of back: Secondary | ICD-10-CM | POA: Diagnosis not present

## 2021-02-11 DIAGNOSIS — M9903 Segmental and somatic dysfunction of lumbar region: Secondary | ICD-10-CM | POA: Diagnosis not present

## 2021-02-11 DIAGNOSIS — M5136 Other intervertebral disc degeneration, lumbar region: Secondary | ICD-10-CM | POA: Diagnosis not present

## 2021-02-25 ENCOUNTER — Other Ambulatory Visit: Payer: Medicare Other

## 2021-02-25 ENCOUNTER — Ambulatory Visit: Payer: Medicare Other | Admitting: Oncology

## 2021-03-10 ENCOUNTER — Other Ambulatory Visit: Payer: Self-pay

## 2021-03-10 ENCOUNTER — Other Ambulatory Visit: Payer: Medicare Other

## 2021-03-10 ENCOUNTER — Encounter: Payer: Self-pay | Admitting: Family Medicine

## 2021-03-10 ENCOUNTER — Other Ambulatory Visit (INDEPENDENT_AMBULATORY_CARE_PROVIDER_SITE_OTHER): Payer: Medicare Other | Admitting: Family Medicine

## 2021-03-10 ENCOUNTER — Telehealth (INDEPENDENT_AMBULATORY_CARE_PROVIDER_SITE_OTHER): Payer: Medicare Other | Admitting: Family Medicine

## 2021-03-10 VITALS — Ht 66.5 in

## 2021-03-10 DIAGNOSIS — J029 Acute pharyngitis, unspecified: Secondary | ICD-10-CM

## 2021-03-10 DIAGNOSIS — M2550 Pain in unspecified joint: Secondary | ICD-10-CM

## 2021-03-10 DIAGNOSIS — Z20822 Contact with and (suspected) exposure to covid-19: Secondary | ICD-10-CM

## 2021-03-10 DIAGNOSIS — C61 Malignant neoplasm of prostate: Secondary | ICD-10-CM

## 2021-03-10 LAB — POC INFLUENZA A&B (BINAX/QUICKVUE)
Influenza A, POC: NEGATIVE
Influenza B, POC: NEGATIVE

## 2021-03-10 LAB — POCT RAPID STREP A (OFFICE): Rapid Strep A Screen: NEGATIVE

## 2021-03-10 MED ORDER — PAXLOVID 20 X 150 MG & 10 X 100MG PO TBPK: 3.0000 | ORAL_TABLET | Freq: Two times a day (BID) | ORAL | 0 refills | Status: DC

## 2021-03-10 NOTE — Progress Notes (Signed)
      Jakwan Sally T. Duaine Radin, MD Primary Care and Sports Medicine [facility] at [facility] [place], [address] Phone: [phone]  FAX: [phone]  TRIPTON SCHLESSER - 87 y.o. male  MRN [id number]  Date of Birth: 10-26-1930  Visit Date: 03/10/2021  PCP: Owens Loffler, MD  Referred by: Owens Loffler, MD  Virtual Visit via Video Note:  I connected with  Cesar Powell on 03/10/2021 11:00 AM EDT by a video enabled telemedicine application and verified that I am speaking with the correct person using two identifiers.   Location patient: home computer, tablet, or smartphone Location provider: work or home office Consent: Verbal consent directly obtained from Cesar Powell. Persons participating in the virtual visit: patient, provider  I discussed the limitations of evaluation and management by telemedicine and the availability of in person appointments. The patient expressed understanding and agreed to proceed.  Chief Complaint  Patient presents with   Sore Throat    X 4 days-2 negative Covid home test   Fatigue    History of Present Illness:  ST, fatigue, #2 covid tests are neg at home.  4 d ago, felt achy all over.  Felt really poorly. No neuro sx No n/v/d No known exposures Metastatic prostate cancer on Reservatrol right now  BP is normally ok [id number] systolic     Covid Flu Strep  Review of Systems as above: See pertinent positives and pertinent negatives per HPI No acute distress verbally   Observations/Objective/Exam:  An attempt was made to discern vital signs over the phone and per patient if applicable and possible.   General:    Alert, Oriented, appears well and in no acute distress  Pulmonary:     On inspection no signs of respiratory distress.  Psych / Neurological:     Pleasant and cooperative.  Assessment and Plan:    ICD-10-CM   1. Suspected COVID-19 virus infection  Z20.822     2.  Polyarthralgia  M25.50     3. Prostate cancer ([place])  C61     4. Sore throat  J02.9      High level concern for Covid-19.  With metastatic cancer at age 55, I will initiate anti-virals now.  Check Covid test, flu, and strep this afternoon.  I discussed the assessment and treatment plan with the patient. The patient was provided an opportunity to ask questions and all were answered. The patient agreed with the plan and demonstrated an understanding of the instructions.   The patient was advised to call back or seek an in-person evaluation if the symptoms worsen or if the condition fails to improve as anticipated.  Follow-up: prn unless noted otherwise below No follow-ups on file.  Meds ordered this encounter  Medications   Nirmatrelvir & Ritonavir (PAXLOVID) 20 x 150 MG & 10 x '100MG'$  TBPK    Sig: Take 3 tablets by mouth 2 (two) times daily. Take nirmatrelvir ('150mg'$ ) 2 tablet twice daily for 5 days and ritonavir ('100mg'$ ) 1 tablet twice daily for 5 days    Dispense:  30 tablet    Refill:  0   No orders of the defined types were placed in this encounter.   Signed,  Maud Deed. Lenita Peregrina, MD

## 2021-03-11 LAB — NOVEL CORONAVIRUS, NAA: SARS-CoV-2, NAA: NOT DETECTED

## 2021-03-11 LAB — SARS-COV-2, NAA 2 DAY TAT

## 2021-03-17 ENCOUNTER — Telehealth: Payer: Self-pay | Admitting: Family Medicine

## 2021-03-17 ENCOUNTER — Other Ambulatory Visit: Payer: Self-pay

## 2021-03-17 ENCOUNTER — Other Ambulatory Visit (INDEPENDENT_AMBULATORY_CARE_PROVIDER_SITE_OTHER): Payer: Medicare Other

## 2021-03-17 DIAGNOSIS — J029 Acute pharyngitis, unspecified: Secondary | ICD-10-CM

## 2021-03-17 NOTE — Telephone Encounter (Signed)
Pt had video visit with covid test, strep test and flu tests which were all negative.pt continues with S/T with pain level of 8 and pt wants to know per Access nurse note if can have mono test done. Pt request cb. Sending note to Dr Lorelei Pont and Butch Penny CMA.[name]

## 2021-03-17 NOTE — Telephone Encounter (Signed)
[facility] - Client TELEPHONE ADVICE RECORD [facility] Patient Name: Cesar Powell Gender: Male DOB: 1930-08-24 Age: 87 Y 57 M 16 D Return Phone Number: [phone] (Primary), [phone] (Secondary) Address: City/ State/ Zip: [place] [address] Client Cesar Powell [facility] [place] [facility] - Client Client Site [place] Physician Owens Loffler - MD Contact Type Call Who Is Calling Patient / Member / Family / Caregiver Call Type Triage / Clinical Relationship To Patient Self Return Phone Number [phone] (Secondary) Chief Complaint FEVER - greater than or equal to 104 or less than 97 Reason for Call Symptomatic / Request for [name] states that he has a sore throat. He does not have Covid. Could he have Mono? He has a temperature of 97. Translation No Nurse Assessment Nurse: Alinda Money, RN, Sarah Date/Time Eilene Ghazi Time): 03/15/2021 1:52:56 PM Confirm and document reason for call. If symptomatic, describe symptoms. ---Caller states he has had a sore throat for 2 weeks. Was seen in office and all tests came back negative (Covid, Strep, etc.). C/o some mild body aches at times. Sore throat pain 8/10. Temperature 97. Does the patient have any new or worsening symptoms? ---Yes Will a triage be completed? ---Yes Related visit to physician within the last 2 weeks? ---Yes Does the PT have any chronic conditions? (i.e. diabetes, asthma, this includes High risk factors for pregnancy, etc.) ---Unknown Is this a behavioral health or substance abuse call? ---No Guidelines Guideline Title Affirmed Question Affirmed Notes Nurse Date/Time Eilene Ghazi Time) Sore Throat SEVERE (e.g., excruciating) throat pain Goins, RN, Judson Roch 03/15/2021 1:54:59 PM Disp. Time Eilene Ghazi Time) Disposition Final User 03/15/2021 1:50:48 PM Send to Urgent Queue Windy Canny 03/15/2021 1:57:41 PM  See PCP within 24 Hours Yes Goins, RN, Judson Roch PLEASE NOTE: All timestamps contained within this report are represented as [place] Standard Time. CONFIDENTIALTY NOTICE: This fax transmission is intended only for the addressee. It contains information that is legally privileged, confidential or otherwise protected from use or disclosure. If you are not the intended recipient, you are strictly prohibited from reviewing, disclosing, copying using or disseminating any of this information or taking any action in reliance on or regarding this information. If you have received this fax in error, please notify us immediately by telephone so that we can arrange for its return to [place]. Phone: [phone], Toll-Free: [phone], Fax: [phone] Page: 2 of 2 Call Id: [id number] [place] Disagree/Comply Comply Caller Understands Yes PreDisposition Call Doctor Care Advice Given Per Guideline SEE PCP WITHIN 24 HOURS: * IF OFFICE WILL BE CLOSED: You need to be seen within the next 24 hours. A clinic or an urgent care center is often a good source of care if your doctor's office is closed or you can't get an appointment. SORE THROAT: * Gargle with warm salt water four times a day. To make salt water, put 1/2 teaspoon of salt in 8 oz (240 ml) of warm water. * Sip warm chicken broth or apple juice. CALL BACK IF: * You become worse CARE ADVICE given per Sore Throat (Adult) guideline. Referrals GO TO FACILITY UNDECIDED

## 2021-03-17 NOTE — Telephone Encounter (Signed)
I put an EBV panel in for today - if he can come today

## 2021-03-17 NOTE — Telephone Encounter (Signed)
Pt was seen on 03/11/11 he is still having a sore throat an wanted to know if he can be tested for mono

## 2021-03-17 NOTE — Addendum Note (Signed)
Addended by: Owens Loffler on: 03/17/2021 01:22 PM   Modules accepted: Orders

## 2021-03-17 NOTE — Telephone Encounter (Signed)
Spoke with Cesar Powell and scheduled lab appointment today at 3:00 pm.

## 2021-03-18 LAB — EPSTEIN-BARR VIRUS VCA ANTIBODY PANEL
EBV NA IgG: >600 U/mL — ABNORMAL HIGH
EBV VCA IgG: >750 U/mL — ABNORMAL HIGH
EBV VCA IgM: <36 U/mL

## 2021-03-22 ENCOUNTER — Ambulatory Visit
Admission: EM | Admit: 2021-03-22 | Discharge: 2021-03-22 | Disposition: A | Discharge status: Home / Self Care | Payer: Medicare Other | Attending: Emergency Medicine | Admitting: Emergency Medicine

## 2021-03-22 ENCOUNTER — Other Ambulatory Visit: Payer: Self-pay

## 2021-03-22 DIAGNOSIS — I10 Essential (primary) hypertension: Secondary | ICD-10-CM | POA: Diagnosis not present

## 2021-03-22 DIAGNOSIS — R52 Pain, unspecified: Secondary | ICD-10-CM

## 2021-03-22 DIAGNOSIS — R030 Elevated blood-pressure reading, without diagnosis of hypertension: Secondary | ICD-10-CM

## 2021-03-22 NOTE — ED Provider Notes (Signed)
CHIEF COMPLAINT:   Chief Complaint  Patient presents with   Sore Throat   Generalized Body Aches     SUBJECTIVE/HPI:   Sore Throat  A very pleasant 87 y.o.Male presents today with sore throat and generalized body aches for the last 4 weeks.  Patient reports being tested for COVID-19, influenza, mononucleosis which have all been negative.  He reports that his blood pressure has been elevated as of late and states that it is usually [id number] systolic.  He does not report any known exposure to anyone for whom has been sick.  Patient reports feeling feverish off and on.  Patient does not report any shortness of breath, chest pain, palpitations, visual changes, weakness, tingling, headache, nausea, vomiting, diarrhea, chills.   has a past medical history of H/O prostate cancer, Hypertension, Local recurrence of prostate cancer ([place]) (06/21/2004), Obstructive sleep apnea (06/22/2007), and Sleep apnea (1977).  ROS:  Review of Systems See Subjective/HPI Medications, Allergies and Problem List personally reviewed in Epic today OBJECTIVE:   Vitals:   03/22/21 1528  BP: (S) (!) 163/68  Pulse: 75  Resp: 16  Temp: 98.2 F (36.8 C)  SpO2: 98%    Physical Exam   General: Appears well-developed and well-nourished. No acute distress.  HEENT Head: Normocephalic and atraumatic.  Ears: Hearing grossly intact, no drainage or visible deformity.  Nose: No nasal deviation.   Mouth/Throat: No stridor or tracheal deviation.  Non erythematous posterior pharynx noted with clear drainage present.  No white patchy exudate noted. Eyes: Conjunctivae and EOM are normal. No eye drainage or scleral icterus bilaterally.  Neck: Normal range of motion, neck is supple.  Cardiovascular: Normal rate. Regular rhythm; no murmurs, gallops, or rubs.  Pulm/Chest: No respiratory distress. Breath sounds normal bilaterally without wheezes, rhonchi, or rales.  Neurological: Alert and oriented to person, place, and time.   Skin: Skin is warm and dry.  No rashes, lesions, abrasions or bruising noted to skin.   Psychiatric: Normal mood, affect, behavior, and thought content.   Vital signs and nursing note reviewed.   Patient stable and cooperative with examination. PROCEDURES:    LABS/X-RAYS/EKG/MEDS:   No results found for any visits on 03/22/21.  MEDICAL DECISION MAKING:   Patient presents with sore throat and generalized body aches for the last 4 weeks.  Patient reports being tested for COVID-19, influenza, mononucleosis which have all been negative.  He reports that his blood pressure has been elevated as of late and states that it is usually [id number] systolic.  He does not report any known exposure to anyone for whom has been sick.  Patient reports feeling feverish off and on.  Patient does not report any shortness of breath, chest pain, palpitations, visual changes, weakness, tingling, headache, nausea, vomiting, diarrhea, chills.  Chart review completed.  Given symptoms, age and duration I feel as if the patient would benefit from a CBC and a CMP.  Advised the patient that we would have this lab work back tomorrow.  His work-up in clinic today is benign with the exception of an elevated blood pressure reading of 163/68.  He does not appear to be in any acute distress and is nontoxic in appearance.  Advised that we will call him with any concerning lab results and advised that if his lab work is normal he will need to follow-up with his PCP for additional testing and work-up.  Advised of strict emergency department precautions for shortness of breath, chest pain, dizziness and fainting.  Return as  needed  Patient verbalized understanding and agreed with treatment plan.  Patient stable upon discharge. ASSESSMENT/PLAN:  1. Generalized body aches - Comprehensive metabolic panel; Standing - CBC with Differential/Platelet; Standing - Comprehensive metabolic panel - CBC with Differential/Platelet  2. Elevated blood  pressure reading   Plan:   Discharge Instructions      We will call you with any concerning lab results from your testing completed in clinic today.  If your lab work is normal, you will need to follow up with your primary care provider for additional testing and work up.  If you begin to notice chest pain, dizziness, fainting, shortness of breath, please report to the ER immediately.         Cesar Powell, [place] 03/22/21 1556

## 2021-03-22 NOTE — ED Triage Notes (Signed)
Patient presents to Urgent Care with complaints of sore throat and generalized body aches x 4 weeks. He states he has been tested for COVID, flu, mono all negative. He has noted since onset of symptoms he has been having elevated BP. He reports baseline in 130's over the past few weeks it has increased in the 150 range.   Denies fever, SOB, or chest pain.

## 2021-03-22 NOTE — Discharge Instructions (Signed)
We will call you with any concerning lab results from your testing completed in clinic today.  If your lab work is normal, you will need to follow up with your primary care provider for additional testing and work up.  If you begin to notice chest pain, dizziness, fainting, shortness of breath, please report to the ER immediately.

## 2021-03-23 LAB — COMPREHENSIVE METABOLIC PANEL
ALT: 13 IU/L (ref 0–44)
AST: 15 IU/L (ref 0–40)
Albumin/Globulin Ratio: 1.5 (ref 1.2–2.2)
Albumin: 3.7 g/dL (ref 3.6–4.6)
Alkaline Phosphatase: 64 IU/L (ref 44–121)
BUN/Creatinine Ratio: 20 (ref 10–24)
BUN: 19 mg/dL (ref 8–27)
Bilirubin Total: 0.2 mg/dL (ref 0.0–1.2)
CO2: 20 mmol/L (ref 20–29)
Calcium: 8.5 mg/dL — ABNORMAL LOW (ref 8.6–10.2)
Chloride: 101 mmol/L (ref 96–106)
Creatinine, Ser: 0.93 mg/dL (ref 0.76–1.27)
Globulin, Total: 2.4 g/dL (ref 1.5–4.5)
Glucose: 107 mg/dL — ABNORMAL HIGH (ref 65–99)
Potassium: 4.4 mmol/L (ref 3.5–5.2)
Sodium: 137 mmol/L (ref 134–144)
Total Protein: 6.1 g/dL (ref 6.0–8.5)
eGFR: 79 mL/min/{1.73_m2} (ref >59)

## 2021-03-23 LAB — CBC WITH DIFFERENTIAL/PLATELET
Basophils Absolute: 0 10*3/uL (ref 0.0–0.2)
Basos: 0 %
EOS (ABSOLUTE): 0.1 10*3/uL (ref 0.0–0.4)
Eos: 1 %
Hematocrit: 26.7 % — ABNORMAL LOW (ref 37.5–51.0)
Hemoglobin: 8.7 g/dL — ABNORMAL LOW (ref 13.0–17.7)
Immature Grans (Abs): 0.1 10*3/uL (ref 0.0–0.1)
Immature Granulocytes: 1 %
Lymphocytes Absolute: 0.6 10*3/uL — ABNORMAL LOW (ref 0.7–3.1)
Lymphs: 5 %
MCH: 29.4 pg (ref 26.6–33.0)
MCHC: 32.6 g/dL (ref 31.5–35.7)
MCV: 90 fL (ref 79–97)
Monocytes Absolute: 0.9 10*3/uL (ref 0.1–0.9)
Monocytes: 8 %
Neutrophils Absolute: 9.2 10*3/uL — ABNORMAL HIGH (ref 1.4–7.0)
Neutrophils: 85 %
Platelets: 392 10*3/uL (ref 150–450)
RBC: 2.96 x10E6/uL — ABNORMAL LOW (ref 4.14–5.80)
RDW: 12.7 % (ref 11.6–15.4)
WBC: 10.8 10*3/uL (ref 3.4–10.8)

## 2021-03-24 ENCOUNTER — Telehealth: Payer: Self-pay | Admitting: *Deleted

## 2021-03-24 NOTE — Telephone Encounter (Signed)
Pt called to see if he needed to make an appt to discuss labs or can a call be made to discuss

## 2021-03-24 NOTE — Telephone Encounter (Signed)
PLEASE NOTE: All timestamps contained within this report are represented as [place] Standard Time. CONFIDENTIALTY NOTICE: This fax transmission is intended only for the addressee. It contains information that is legally privileged, confidential or otherwise protected from use or disclosure. If you are not the intended recipient, you are strictly prohibited from reviewing, disclosing, copying using or disseminating any of this information or taking any action in reliance on or regarding this information. If you have received this fax in error, please notify us immediately by telephone so that we can arrange for its return to [place]. Phone: [phone], Toll-Free: [phone], Fax: [phone] Page: 1 of 2 Call Id: [id number] [place] [facility] - Client TELEPHONE ADVICE RECORD [facility] Patient Name: Cesar Powell Gender: Male DOB: [birth year] Age: 87 Y 48 M 23 D Return Phone Number: [phone] (Primary), [phone] (Secondary) Address: City/ State/ Zip: [place] [address] Client [facility] - Client Client Site [place] Physician Copland, Frederico Hamman - MD Contact Type Call Who Is Calling Patient / Member / Family / Caregiver Call Type Triage / Clinical Relationship To Patient Self Return Phone Number [phone] (Secondary) Chief Complaint Sore Throat Reason for Call Symptomatic / Request for [place] states he still has symptoms, sore throat, joint pain, fatigue, requests order for complete [facility] in [place], [place] Translation No Nurse Assessment Nurse: Tressia Danas, RN, [place] Date/Time (Eastern Time): 03/22/2021 1:53:51 PM Confirm and document reason for call. If symptomatic, describe symptoms. ---Caller states he still has symptoms, sore throat, joint pain, fatigue, requests order for complete lab work caller states  his COVID test, strep test and mono test. states all the tests were negative. states he has been sick for 4 weeks. Does the patient have any new or worsening symptoms? ---Yes Will a triage be completed? ---Yes Related visit to physician within the last 2 weeks? ---No Does the PT have any chronic conditions? (i.e. diabetes, asthma, this includes High risk factors for pregnancy, etc.) ---No Is this a behavioral health or substance abuse call? ---No Guidelines Guideline Title Affirmed Question Affirmed Notes Nurse Date/Time Eilene Ghazi Time) COVID-19 - Persisting Symptoms Follow-up Call [1] PERSISTING SYMPTOMS OF COVID-19 AND [2] symptoms WORSE [place], RN, [facility] 03/22/2021 1:56:36 PM Weakness (Generalized) and Fatigue [1] MODERATE weakness (i.e., interferes with work, school, normal [place], [place], [place] 03/22/2021 1:59:26 PM PLEASE NOTE: All timestamps contained within this report are represented as [place] Standard Time. CONFIDENTIALTY NOTICE: This fax transmission is intended only for the addressee. It contains information that is legally privileged, confidential or otherwise protected from use or disclosure. If you are not the intended recipient, you are strictly prohibited from reviewing, disclosing, copying using or disseminating any of this information or taking any action in reliance on or regarding this information. If you have received this fax in error, please notify us immediately by telephone so that we can arrange for its return to [place]. Phone: [phone], Toll-Free: [phone], Fax: [phone] Page: 2 of 2 Call Id: [id number] Guidelines Guideline Title Affirmed Question Affirmed Notes Nurse Date/Time Eilene Ghazi Time) activities) AND [2] cause unknown (Exceptions: weakness with acute minor illness, or weakness from poor fluid intake) Disp. Time Eilene Ghazi Time) Disposition Final User 03/22/2021 1:58:51 PM See PCP within 24 Hours [place], RN,  [facility] 03/22/2021 2:01:07 PM See HCP within 4 Hours (or PCP triage) Yes McClarnon, RN, [place] Disagree/Comply Comply Caller Understands Yes PreDisposition Did not know what to  do Care Advice Given Per Guideline SEE PCP WITHIN 24 HOURS: * IF OFFICE WILL BE CLOSED: You need to be seen within the next 24 hours. A clinic or an urgent care center is often a good source of care if your doctor's office is closed or you can't get an appointment. CARE ADVICE given per COVID-19 - Persisting Symptoms Follow-Up Call (Adult) guideline. * You become worse CALL BACK IF: SEE HCP (OR PCP TRIAGE) WITHIN 4 HOURS: * IF OFFICE WILL BE CLOSED AND NO PCP (PRIMARY CARE PROVIDER) SECOND-LEVEL TRIAGE: You need to be seen within the next 3 or 4 hours. A nearby Urgent Care Center [facility]) is often a good source of care. Another choice is to go to the ED. Go sooner if you become worse. CARE ADVICE given per Weakness and Fatigue (Adult) guideline. * You become worse CALL BACK IF: Referrals GO TO FACILITY OTHER - SPECIFY

## 2021-03-24 NOTE — Telephone Encounter (Signed)
I do not know how to send to the [facility] team to set-up appointment, but I have not evaluated face to face.  Needs more work-up.  He is very anemic on his bloodwork a few days ago.  Face to face OV

## 2021-03-24 NOTE — Telephone Encounter (Signed)
Spoke with Mr. Boll and scheduled office visit with Dr. Lorelei Pont 03/26/2021 at 8:00 am.

## 2021-03-24 NOTE — Telephone Encounter (Signed)
See notes from Urgent Care

## 2021-03-25 NOTE — Progress Notes (Signed)
Cesar Savell T. Merlene Dante, MD, [place] at [facility] [place], [address]  Phone: [phone]  FAX: [phone]  Cesar Powell - 87 y.o. male  MRN [id number]  Date of Birth: Mar 08, 1931  Date: 03/26/2021  PCP: Owens Loffler, MD  Referral: Owens Loffler, MD  Chief Complaint  Patient presents with   Follow-up    ER Visit/Lab results    This visit occurred during the SARS-CoV-2 public health emergency.  Safety protocols were in place, including screening questions prior to the visit, additional usage of staff PPE, and extensive cleaning of exam room while observing appropriate contact time as indicated for disinfecting solutions.   Subjective:   Cesar Powell is a 87 y.o. very pleasant male patient with Body mass index is 35.26 kg/m. who presents with the following:  He continues to not feel well with negative Covid, Strep, Flu, and Mono testing.  History is also significant for metastatic prostate cancer.  He continues to generally feel bad and ha a low level of energy.  I did do a virtual visit, and he also did do a urgent care visit.  He was found to be profoundly anemic with a hemoglobin in the 8 range.  Had been eating really well, and then he went off of his diet. Then started to feel really bad.  Total lack of energy  Throat still hurting a lot.   Aching all over his body???  ESR CRP Anemia work-up  04/17/2021 - appt with Dr. Janese Banks  No statin  Lab Results  Component Value Date   PSA1 10.1 (H) 01/24/2021   PSA1 5.7 (H) 07/03/2019   PSA1 2.9 09/16/2018   PSA 3.86 06/12/2020   PSA 0.47 10/21/2015   PSA 0.32 08/26/2009      Results for orders placed or performed in visit on 03/26/21  Sedimentation rate  Result Value Ref Range   Sed Rate 63 (H) 0 - 20 mm/hr  IBC + Ferritin  Result Value Ref Range   Iron 25 (L) 42 - 165 ug/dL   Transferrin 147.0 (L) 212.0 - 360.0 mg/dL   Saturation  Ratios 12.1 (L) 20.0 - 50.0 %   Ferritin 436.5 (H) 22.0 - 322.0 ng/mL   TIBC 205.8 (L) 250.0 - 450.0 mcg/dL  Vitamin B12  Result Value Ref Range   Vitamin B-12 >1550 (H) 211 - 911 pg/mL  Pathologist smear review  Result Value Ref Range   Path Review    Haptoglobin  Result Value Ref Range   Haptoglobin 427 (H) 43 - 212 mg/dL  Lactate Dehydrogenase  Result Value Ref Range   LDH 135 120 - 250 U/L  High sensitivity CRP  Result Value Ref Range   CRP, High Sensitivity 62.940 (H) 0.000 - 5.000 mg/L  TSH  Result Value Ref Range   TSH 5.17 0.35 - 5.50 uIU/mL  CBC with Differential/Platelet  Result Value Ref Range   WBC 9.8 3.8 - 10.8 Thousand/uL   RBC 2.94 (L) 4.20 - 5.80 Million/uL   Hemoglobin 8.7 (L) 13.2 - 17.1 g/dL   HCT 27.2 (L) 38.5 - 50.0 %   MCV 92.5 80.0 - 100.0 fL   MCH 29.6 27.0 - 33.0 pg   MCHC 32.0 32.0 - 36.0 g/dL   RDW 12.8 11.0 - 15.0 %   Platelets 371 140 - 400 Thousand/uL   MPV 9.4 7.5 - 12.5 fL   Neutro Abs 8,457 (H) 1,500 - 7,800 cells/uL  Lymphs Abs 480 (L) 850 - 3,900 cells/uL   Absolute Monocytes 755 200 - 950 cells/uL   Eosinophils Absolute 78 15 - 500 cells/uL   Basophils Absolute 29 0 - 200 cells/uL   Neutrophils Relative % 86.3 %   Total Lymphocyte 4.9 %   Monocytes Relative 7.7 %   Eosinophils Relative 0.8 %   Basophils Relative 0.3 %  PSA, Total with Reflex to PSA, Free  Result Value Ref Range   PSA, Total 14.8 (H) < OR = 4.0 ng/mL     Review of Systems is noted in the HPI, as appropriate  Objective:   BP (!) 150/60   Pulse 68   Temp 98.1 F (36.7 C) (Temporal)   Ht 5' 6.5" (1.689 m)   Wt 221 lb 12 oz (100.6 kg)   SpO2 99%   BMI 35.26 kg/m   GEN: No acute distress; alert,appropriate. PULM: Breathing comfortably in no respiratory distress PSYCH: Normally interactive.  CV: RRR, no m/g/r  PULM: Normal respiratory rate, no accessory muscle use. No wheezes, crackles or rhonchi   Laboratory and Imaging Data: Results for orders  placed or performed in visit on 03/26/21  Sedimentation rate  Result Value Ref Range   Sed Rate 63 (H) 0 - 20 mm/hr  IBC + Ferritin  Result Value Ref Range   Iron 25 (L) 42 - 165 ug/dL   Transferrin 147.0 (L) 212.0 - 360.0 mg/dL   Saturation Ratios 12.1 (L) 20.0 - 50.0 %   Ferritin 436.5 (H) 22.0 - 322.0 ng/mL   TIBC 205.8 (L) 250.0 - 450.0 mcg/dL  Vitamin B12  Result Value Ref Range   Vitamin B-12 >1550 (H) 211 - 911 pg/mL  Pathologist smear review  Result Value Ref Range   Path Review    Haptoglobin  Result Value Ref Range   Haptoglobin 427 (H) 43 - 212 mg/dL  Lactate Dehydrogenase  Result Value Ref Range   LDH 135 120 - 250 U/L  High sensitivity CRP  Result Value Ref Range   CRP, High Sensitivity 62.940 (H) 0.000 - 5.000 mg/L  TSH  Result Value Ref Range   TSH 5.17 0.35 - 5.50 uIU/mL  CBC with Differential/Platelet  Result Value Ref Range   WBC 9.8 3.8 - 10.8 Thousand/uL   RBC 2.94 (L) 4.20 - 5.80 Million/uL   Hemoglobin 8.7 (L) 13.2 - 17.1 g/dL   HCT 27.2 (L) 38.5 - 50.0 %   MCV 92.5 80.0 - 100.0 fL   MCH 29.6 27.0 - 33.0 pg   MCHC 32.0 32.0 - 36.0 g/dL   RDW 12.8 11.0 - 15.0 %   Platelets 371 140 - 400 Thousand/uL   MPV 9.4 7.5 - 12.5 fL   Neutro Abs 8,457 (H) 1,500 - 7,800 cells/uL   Lymphs Abs 480 (L) 850 - 3,900 cells/uL   Absolute Monocytes 755 200 - 950 cells/uL   Eosinophils Absolute 78 15 - 500 cells/uL   Basophils Absolute 29 0 - 200 cells/uL   Neutrophils Relative % 86.3 %   Total Lymphocyte 4.9 %   Monocytes Relative 7.7 %   Eosinophils Relative 0.8 %   Basophils Relative 0.3 %  PSA, Total with Reflex to PSA, Free  Result Value Ref Range   PSA, Total 14.8 (H) < OR = 4.0 ng/mL     Assessment and Plan:     ICD-10-CM   1. Anemia, unspecified type  D64.9 Sedimentation rate    IBC + Ferritin    Vitamin  B12    Pathologist smear review    Haptoglobin    Lactate Dehydrogenase    CBC with Differential/Platelet    2. Other fatigue  R53.83  Sedimentation rate    IBC + Ferritin    Vitamin B12    Pathologist smear review    Haptoglobin    Lactate Dehydrogenase    High sensitivity CRP    TSH    CBC with Differential/Platelet    3. Polyarthralgia  M25.50 Sedimentation rate    High sensitivity CRP    TSH    CBC with Differential/Platelet    4. Myalgia  M79.10 Sedimentation rate    High sensitivity CRP    TSH    CBC with Differential/Platelet    5. B12 deficiency  E53.8 Vitamin B12    CBC with Differential/Platelet    6. Pure hyperglyceridemia  E78.1 High sensitivity CRP    CBC with Differential/Platelet    7. Prostate cancer ([place])  C61 PSA, Total with Reflex to PSA, Free     Profound anemia, Polyarthralgia, myalgia as well as general malaise and fatigue.  I think that this is likely multifactorial, he does certainly have very significant blood test abnormalities, and he does have metastatic cancer.  Thankfully does have follow-up with his oncologist within the next few weeks.  I am going to send Dr. Janese Banks a copy of my note also, too.  His CRP and sed rate are very high.  I am not sure if this is from his cancer, in another setting polymyalgia rheumatica could be seen at this clinical presentation and lab work.  Social: Medical issues are profoundly limiting his strength, energy, and even in his activities of daily living.  No orders of the defined types were placed in this encounter.  Medications Discontinued During This Encounter  Medication Reason   Nirmatrelvir & Ritonavir (PAXLOVID) 20 x 150 MG & 10 x 100MG TBPK    Orders Placed This Encounter  Procedures   Sedimentation rate   IBC + Ferritin   Vitamin B12   Pathologist smear review   Haptoglobin   Lactate Dehydrogenase   High sensitivity CRP   TSH   CBC with Differential/Platelet   PSA, Total with Reflex to PSA, Free    Follow-up: No follow-ups on file.  Dr[employer] One speech-to-text software was used for transcription in this dictation.   Possible transcriptional errors can occur using Editor, commissioning.   Signed,  Maud Deed. Resha Filippone, MD   Outpatient Encounter Medications as of 03/26/2021  Medication Sig   ascorbic acid (VITAMIN C) 500 MG tablet Take 1,000 mg by mouth daily.   Cholecalciferol (VITAMIN D3) 50 MCG (2000 UT) TABS Take 2,000 Units by mouth daily.   Misc Natural Products (IMMUNE FORMULA PO) Take 2 capsules by mouth daily. Nutriferon by [employer] (JOINT HEALTH PO) Take 2 tablets by mouth daily.   Multiple Vitamin (MULTIVITAMIN WITH MINERALS) TABS tablet Take 2 tablets by mouth daily. Vita-Lea   Multiple Vitamins-Minerals (ZINC PO) Take 1 tablet by mouth daily.   Nutritional Supplements (IMMUNE ENHANCE) TABS Take 1 tablet by mouth daily.   Probiotic Product (PROBIOTIC PO) Take 1 capsule by mouth daily.   RESVERATROL PO Take 2 capsules by mouth daily. Vivix   TURMERIC PO Take 1 capsule by mouth daily.   [DISCONTINUED] Nirmatrelvir & Ritonavir (PAXLOVID) 20 x 150 MG & 10 x 100MG TBPK Take 3 tablets by mouth 2 (two) times daily. Take nirmatrelvir (159m) 2  tablet twice daily for 5 days and ritonavir (174m) 1 tablet twice daily for 5 days   [DISCONTINUED] tamsulosin (FLOMAX) 0.4 MG CAPS capsule Take 0.8 mg by mouth daily.   No facility-administered encounter medications on file as of 03/26/2021.

## 2021-03-26 ENCOUNTER — Ambulatory Visit (INDEPENDENT_AMBULATORY_CARE_PROVIDER_SITE_OTHER): Payer: Medicare Other | Admitting: Family Medicine

## 2021-03-26 ENCOUNTER — Other Ambulatory Visit: Payer: Self-pay

## 2021-03-26 ENCOUNTER — Encounter: Payer: Self-pay | Admitting: Family Medicine

## 2021-03-26 VITALS — BP 150/60 | HR 68 | Temp 98.1°F | Ht 66.5 in | Wt 221.8 lb

## 2021-03-26 DIAGNOSIS — M2550 Pain in unspecified joint: Secondary | ICD-10-CM | POA: Diagnosis not present

## 2021-03-26 DIAGNOSIS — C61 Malignant neoplasm of prostate: Secondary | ICD-10-CM | POA: Diagnosis not present

## 2021-03-26 DIAGNOSIS — M791 Myalgia, unspecified site: Secondary | ICD-10-CM | POA: Diagnosis not present

## 2021-03-26 DIAGNOSIS — D649 Anemia, unspecified: Secondary | ICD-10-CM

## 2021-03-26 DIAGNOSIS — R5383 Other fatigue: Secondary | ICD-10-CM

## 2021-03-26 DIAGNOSIS — E538 Deficiency of other specified B group vitamins: Secondary | ICD-10-CM | POA: Diagnosis not present

## 2021-03-26 DIAGNOSIS — E781 Pure hyperglyceridemia: Secondary | ICD-10-CM | POA: Diagnosis not present

## 2021-03-26 LAB — IBC + FERRITIN
Ferritin: 436.5 ng/mL — ABNORMAL HIGH (ref 22.0–322.0)
Iron: 25 ug/dL — ABNORMAL LOW (ref 42–165)
Saturation Ratios: 12.1 % — ABNORMAL LOW (ref 20.0–50.0)
TIBC: 205.8 ug/dL — ABNORMAL LOW (ref 250.0–450.0)
Transferrin: 147 mg/dL — ABNORMAL LOW (ref 212.0–360.0)

## 2021-03-26 LAB — VITAMIN B12: Vitamin B-12: >1550 pg/mL — ABNORMAL HIGH (ref 211–911)

## 2021-03-26 LAB — HIGH SENSITIVITY CRP: CRP, High Sensitivity: 62.94 mg/L — ABNORMAL HIGH (ref 0.000–5.000)

## 2021-03-26 LAB — SEDIMENTATION RATE: Sed Rate: 63 mm/hr — ABNORMAL HIGH (ref 0–20)

## 2021-03-26 LAB — TSH: TSH: 5.17 u[IU]/mL (ref 0.35–5.50)

## 2021-03-27 LAB — CBC WITH DIFFERENTIAL/PLATELET
Absolute Monocytes: 755 cells/uL (ref 200–950)
Basophils Absolute: 29 cells/uL (ref 0–200)
Basophils Relative: 0.3 %
Eosinophils Absolute: 78 cells/uL (ref 15–500)
Eosinophils Relative: 0.8 %
HCT: 27.2 % — ABNORMAL LOW (ref 38.5–50.0)
Hemoglobin: 8.7 g/dL — ABNORMAL LOW (ref 13.2–17.1)
Lymphs Abs: 480 cells/uL — ABNORMAL LOW (ref 850–3900)
MCH: 29.6 pg (ref 27.0–33.0)
MCHC: 32 g/dL (ref 32.0–36.0)
MCV: 92.5 fL (ref 80.0–100.0)
MPV: 9.4 fL (ref 7.5–12.5)
Monocytes Relative: 7.7 %
Neutro Abs: 8457 cells/uL — ABNORMAL HIGH (ref 1500–7800)
Neutrophils Relative %: 86.3 %
Platelets: 371 10*3/uL (ref 140–400)
RBC: 2.94 10*6/uL — ABNORMAL LOW (ref 4.20–5.80)
RDW: 12.8 % (ref 11.0–15.0)
Total Lymphocyte: 4.9 %
WBC: 9.8 10*3/uL (ref 3.8–10.8)

## 2021-03-27 LAB — PATHOLOGIST SMEAR REVIEW

## 2021-03-27 LAB — HAPTOGLOBIN: Haptoglobin: 427 mg/dL — ABNORMAL HIGH (ref 43–212)

## 2021-03-27 LAB — LACTATE DEHYDROGENASE: LDH: 135 U/L (ref 120–250)

## 2021-03-27 LAB — PSA, TOTAL WITH REFLEX TO PSA, FREE: PSA, Total: 14.8 ng/mL — ABNORMAL HIGH (ref <=4.0)

## 2021-03-28 ENCOUNTER — Other Ambulatory Visit: Payer: Self-pay | Admitting: Urology

## 2021-03-28 DIAGNOSIS — C61 Malignant neoplasm of prostate: Secondary | ICD-10-CM

## 2021-03-28 DIAGNOSIS — N401 Enlarged prostate with lower urinary tract symptoms: Secondary | ICD-10-CM

## 2021-03-31 ENCOUNTER — Telehealth: Payer: Self-pay | Admitting: Oncology

## 2021-03-31 NOTE — Telephone Encounter (Signed)
Appointment scheduled in [place] next week. Called home and cell phones + sent [employer].

## 2021-03-31 NOTE — Telephone Encounter (Signed)
I can see him in [place] next week

## 2021-03-31 NOTE — Telephone Encounter (Signed)
Pt called requesting to speak with Dr. Janese Banks about some labs that were drawn by Dr. Edilia Bo. Pt was told the Dr. Janese Banks is currently in clinic and the request would be sent to her clinical team. The lab results are in [place]. Pt stated he is not making enough red blood cells and this has been going on for over 4 weeks.

## 2021-03-31 NOTE — Telephone Encounter (Signed)
Attempt made to reach patient on home and mobile phone to make him aware of appointment made next Wed in [place] to see Dr. Janese Banks (as patient requested--he would like to discuss concerns about his bloodwork).

## 2021-04-01 ENCOUNTER — Telehealth: Payer: Self-pay | Admitting: Oncology

## 2021-04-01 MED ORDER — PREDNISONE 5 MG PO TABS: 15.0000 mg | ORAL_TABLET | Freq: Every day | ORAL | 0 refills | Status: DC

## 2021-04-01 NOTE — Addendum Note (Signed)
Addended by: Owens Loffler on: 04/01/2021 10:06 AM   Modules accepted: Orders

## 2021-04-01 NOTE — Telephone Encounter (Signed)
Pt son in law is on the phone requesting to be seen before 9/7. Cesar Powell. Stated he is his caregiver. He is not listed on the DPR. He stated the patient will not do anything until he sees you and that he knows something is really wrong with the patient and they are concerned. He would like to speak with someone ASAP. He is currently in [place] right now. He would prefer for you all to speak with him first at [phone], but if not contact his wife [name] @ [phone].

## 2021-04-02 ENCOUNTER — Other Ambulatory Visit: Payer: Self-pay | Admitting: *Deleted

## 2021-04-02 ENCOUNTER — Inpatient Hospital Stay: Payer: Medicare Other

## 2021-04-02 ENCOUNTER — Ambulatory Visit
Admission: RE | Admit: 2021-04-02 | Discharge: 2021-04-02 | Disposition: A | Discharge status: Home / Self Care | Payer: Medicare Other | Source: Ambulatory Visit | Attending: Oncology | Admitting: Oncology

## 2021-04-02 ENCOUNTER — Telehealth: Payer: Self-pay

## 2021-04-02 ENCOUNTER — Inpatient Hospital Stay: Payer: Medicare Other | Attending: Oncology | Admitting: Oncology

## 2021-04-02 ENCOUNTER — Other Ambulatory Visit: Payer: Self-pay

## 2021-04-02 VITALS — BP 151/60 | HR 60 | Temp 98.2°F | Resp 18 | Wt 215.0 lb

## 2021-04-02 DIAGNOSIS — K5900 Constipation, unspecified: Secondary | ICD-10-CM | POA: Insufficient documentation

## 2021-04-02 DIAGNOSIS — I700 Atherosclerosis of aorta: Secondary | ICD-10-CM | POA: Diagnosis not present

## 2021-04-02 DIAGNOSIS — R7982 Elevated C-reactive protein (CRP): Secondary | ICD-10-CM | POA: Insufficient documentation

## 2021-04-02 DIAGNOSIS — R300 Dysuria: Secondary | ICD-10-CM

## 2021-04-02 DIAGNOSIS — D649 Anemia, unspecified: Secondary | ICD-10-CM

## 2021-04-02 DIAGNOSIS — Z87891 Personal history of nicotine dependence: Secondary | ICD-10-CM | POA: Diagnosis not present

## 2021-04-02 DIAGNOSIS — R350 Frequency of micturition: Secondary | ICD-10-CM | POA: Diagnosis not present

## 2021-04-02 DIAGNOSIS — K5730 Diverticulosis of large intestine without perforation or abscess without bleeding: Secondary | ICD-10-CM | POA: Diagnosis not present

## 2021-04-02 DIAGNOSIS — I2510 Atherosclerotic heart disease of native coronary artery without angina pectoris: Secondary | ICD-10-CM | POA: Diagnosis not present

## 2021-04-02 DIAGNOSIS — Z923 Personal history of irradiation: Secondary | ICD-10-CM | POA: Diagnosis not present

## 2021-04-02 DIAGNOSIS — N3289 Other specified disorders of bladder: Secondary | ICD-10-CM | POA: Diagnosis not present

## 2021-04-02 DIAGNOSIS — D509 Iron deficiency anemia, unspecified: Secondary | ICD-10-CM | POA: Insufficient documentation

## 2021-04-02 DIAGNOSIS — C61 Malignant neoplasm of prostate: Secondary | ICD-10-CM | POA: Insufficient documentation

## 2021-04-02 DIAGNOSIS — R590 Localized enlarged lymph nodes: Secondary | ICD-10-CM | POA: Diagnosis not present

## 2021-04-02 DIAGNOSIS — E538 Deficiency of other specified B group vitamins: Secondary | ICD-10-CM | POA: Diagnosis not present

## 2021-04-02 DIAGNOSIS — R5383 Other fatigue: Secondary | ICD-10-CM | POA: Insufficient documentation

## 2021-04-02 LAB — URINALYSIS, COMPLETE (UACMP) WITH MICROSCOPIC
Bilirubin Urine: NEGATIVE
Glucose, UA: NEGATIVE mg/dL
Ketones, ur: NEGATIVE mg/dL
Nitrite: POSITIVE — AB
Specific Gravity, Urine: 1.01 (ref 1.005–1.030)
WBC, UA: >50 WBC/hpf (ref 0–5)
pH: 6.5 (ref 5.0–8.0)

## 2021-04-02 LAB — CBC WITH DIFFERENTIAL/PLATELET
Abs Immature Granulocytes: 0.03 10*3/uL (ref 0.00–0.07)
Basophils Absolute: 0 10*3/uL (ref 0.0–0.1)
Basophils Relative: 0 %
Eosinophils Absolute: 0.1 10*3/uL (ref 0.0–0.5)
Eosinophils Relative: 1 %
HCT: 27.9 % — ABNORMAL LOW (ref 39.0–52.0)
Hemoglobin: 9.1 g/dL — ABNORMAL LOW (ref 13.0–17.0)
Immature Granulocytes: 0 %
Lymphocytes Relative: 6 %
Lymphs Abs: 0.5 10*3/uL — ABNORMAL LOW (ref 0.7–4.0)
MCH: 29.5 pg (ref 26.0–34.0)
MCHC: 32.6 g/dL (ref 30.0–36.0)
MCV: 90.6 fL (ref 80.0–100.0)
Monocytes Absolute: 0.6 10*3/uL (ref 0.1–1.0)
Monocytes Relative: 8 %
Neutro Abs: 7.1 10*3/uL (ref 1.7–7.7)
Neutrophils Relative %: 85 %
Platelets: 338 10*3/uL (ref 150–400)
RBC: 3.08 MIL/uL — ABNORMAL LOW (ref 4.22–5.81)
RDW: 13.9 % (ref 11.5–15.5)
WBC: 8.3 10*3/uL (ref 4.0–10.5)
nRBC: 0 % (ref 0.0–0.2)

## 2021-04-02 LAB — CK: Total CK: 100 U/L (ref 49–397)

## 2021-04-02 LAB — FOLATE: Folate: 26 ng/mL (ref >5.9)

## 2021-04-02 LAB — RETICULOCYTES
Immature Retic Fract: 23 % — ABNORMAL HIGH (ref 2.3–15.9)
RBC.: 3.05 MIL/uL — ABNORMAL LOW (ref 4.22–5.81)
Retic Count, Absolute: 74.4 10*3/uL (ref 19.0–186.0)
Retic Ct Pct: 2.4 % (ref 0.4–3.1)

## 2021-04-02 MED ORDER — IOHEXOL 350 MG/ML SOLN
100.0000 mL | Freq: Once | INTRAVENOUS | Status: AC | PRN
  Administered 2021-04-02: 100 mL via INTRAVENOUS

## 2021-04-02 MED ORDER — SULFAMETHOXAZOLE-TRIMETHOPRIM 800-160 MG PO TABS: 1.0000 | ORAL_TABLET | Freq: Two times a day (BID) | ORAL | 0 refills | Status: DC

## 2021-04-02 NOTE — Progress Notes (Signed)
Patient is here today because of recent abnormal labs, fatigue. He also complains of muscle aches, constipation, polyuria, dysuria, and insomnia. He states that his last bowel movement was 2 weeks ago, and it was black in color. He denies any abdominal pain or cramping.

## 2021-04-02 NOTE — Progress Notes (Signed)
Hematology/Oncology Consult note [facility]  [phone] [phone]  Patient Care Team: Owens Loffler, MD as PCP - General Leandrew Koyanagi, MD as Referring Physician (Ophthalmology) Nickie Retort, MD (Inactive) as Consulting Physician (Urology) Noreene Filbert, MD as Radiation Oncologist (Radiation Oncology)   Name of the patient: Cesar Powell  [id number]  07-Oct-1930   Date of visit: 04/02/21  Diagnosis-nonmetastatic castrate sensitive prostate cancer  Chief complaint/ Reason for visit-acute visit for ongoing anemia  Heme/Onc history: patient is a 87 year old gentleman with no significant past medical problems.  He has a history of prostate cancer that was diagnosed and treated with radiation in 2005.  At that point he his PSA was about 0.03 for a long time.  Then started on Axiron for low testosterone.  PSA eventually went up to as high as 18 2015.  At that point he was started on Lupron his PSA doubling time is around 10 months. Most recently since October 2018 after his PSA went down from 3.6-1.7 and has been gradually increasing to 1.3,1.5 and 2 indicating a PSA doubling time of 9.9 months.  Testosterone levels continue to be suppressed.  He has not started oral anti androgen therapy yet.    patient was last seen by me in September 2019 discussed adding oral antiandrogens like apalutamide versus enzalutamide to Lupron in castrate resistant nonmetastatic prostate cancer.  After discussing risks and benefits patient did not wish to proceed.Patient stopped lupron in feb 2020 but did restart after 1 year in February 2021.  Patient received IMRT for his pelvic lymph nodes.    Interval history-patient has been feeling more fatigued over the last 4 to 5 weeks.  He reports diffuse muscle aches especially in his bilateral thighs and arms.  It started off as a sore throat which gradually resolved.  He did undergo COVID tests x3 which  were negative even on PCR.  Reports occasional lightheadedness when he stands.  He has not had any falls.  Currently reports frequent urination and occasional burning on urination.  He has also not had a bowel movement for the last 2 weeks.  No fever.  When he did have a bowel movement 2 weeks ago he felt it was black.  Denies any bright red blood in his stool or urine.  ECOG PS- 1 Pain scale- 3   Review of systems- Review of Systems  Constitutional:  Positive for malaise/fatigue. Negative for chills, fever and weight loss.  HENT:  Negative for congestion, ear discharge and nosebleeds.   Eyes:  Negative for blurred vision.  Respiratory:  Negative for cough, hemoptysis, sputum production, shortness of breath and wheezing.   Cardiovascular:  Negative for chest pain, palpitations, orthopnea and claudication.  Gastrointestinal:  Negative for abdominal pain, blood in stool, constipation, diarrhea, heartburn, melena, nausea and vomiting.  Genitourinary:  Negative for dysuria, flank pain, frequency, hematuria and urgency.  Musculoskeletal:  Positive for myalgias. Negative for back pain and joint pain.  Skin:  Negative for rash.  Neurological:  Negative for dizziness, tingling, focal weakness, seizures, weakness and headaches.  Endo/Heme/Allergies:  Does not bruise/bleed easily.  Psychiatric/Behavioral:  Negative for depression and suicidal ideas. The patient does not have insomnia.      Allergies  Allergen Reactions   Gluten Meal Rash     Past Medical History:  Diagnosis Date   H/O prostate cancer    was treated with radiation   Hypertension    Local recurrence of prostate cancer ([place])  06/21/2004   Qualifier: Diagnosis of  By: Fuller Plan CMA (AAMA), [place]     Obstructive sleep apnea 06/22/2007   NPSG [place] 1979 Unattended Home sleep Study- 05/10/14- Confirms severe OSA, AHI 41.8/ hr, weight 230 pounds CPAP and also Transcend portable CPAP auto 8-15     Sleep apnea 1977   uses C-pap       Past Surgical History:  Procedure Laterality Date   ABDOMINAL SURGERY     ruptured intestines    CATARACT EXTRACTION W/PHACO Left 01/29/2016   Procedure: CATARACT EXTRACTION PHACO AND INTRAOCULAR LENS PLACEMENT ([place]) left eye;  Surgeon: Leandrew Koyanagi, MD;  Location: [place];  Service: Ophthalmology;  Laterality: Left;  RESTOR LENS   CATARACT EXTRACTION W/PHACO Right 10/21/2016   Procedure: CATARACT EXTRACTION PHACO AND INTRAOCULAR LENS PLACEMENT ([facility])  Right restor toric lens;  Surgeon: Leandrew Koyanagi, MD;  Location: [place];  Service: Ophthalmology;  Laterality: Right;  restor toric lens   CYSTOSCOPY WITH LITHOLAPAXY N/A 08/14/2016   Procedure: CYSTOSCOPY WITH LITHOLAPAXY;  Surgeon: Nickie Retort, MD;  Location: [facility];  Service: Urology;  Laterality: N/A;   FEMUR IM NAIL Right 10/08/2014   Procedure: INTRAMEDULLARY (IM) NAIL FEMORAL;  Surgeon: Rod Can, MD;  Location: [place];  Service: Orthopedics;  Laterality: Right;  PER DANIELLE 110 MIN   HOLMIUM LASER APPLICATION  1/61/0960   Procedure: HOLMIUM LASER APPLICATION;  Surgeon: Nickie Retort, MD;  Location: [facility];  Service: Urology;;   OTHER SURGICAL HISTORY  2006   Prostate Radiation Surgery    RADIOACTIVE SEED IMPLANT N/A 10/30/2019   Procedure: RADIOACTIVE SEED IMPLANT/BRACHYTHERAPY IMPLANT;  Surgeon: Billey Co, MD;  Location: [facility];  Service: Urology;  Laterality: N/A;  37 seeds implanted    Social History   Socioeconomic History   Marital status: Married    Spouse name: Not on file   Number of children: Not on file   Years of education: Not on file   Highest education level: Not on file  Occupational History   Occupation: retired  Tobacco Use   Smoking status: Former    Packs/day: 1.00    Years: 15.00    Pack years: 15.00    Types: Cigarettes    Quit date: 09/14/1965    Years since quitting: 55.5   Smokeless tobacco: Never  Vaping Use   Vaping Use: Never  used  Substance and Sexual Activity   Alcohol use: Not Currently    Comment: occassiona;    Drug use: No   Sexual activity: Yes    Birth control/protection: None  Other Topics Concern   Not on file  Social History Narrative   Not on file   Social Determinants of Health   Financial Resource Strain: Not on file  Food Insecurity: Not on file  Transportation Needs: Not on file  Physical Activity: Not on file  Stress: Not on file  Social Connections: Not on file  Intimate Partner Violence: Not on file    Family History  Problem Relation Age of Onset   Heart disease Brother    Heart attack Brother    Lung cancer Brother    Prostate cancer Neg Hx    Bladder Cancer Neg Hx    Kidney cancer Neg Hx      Current Outpatient Medications:    ascorbic acid (VITAMIN C) 500 MG tablet, Take 1,000 mg by mouth daily., Disp: , Rfl:    Cholecalciferol (VITAMIN D3) 50 MCG (2000 UT) TABS, Take  2,000 Units by mouth daily., Disp: , Rfl:    Misc Natural Products (IMMUNE FORMULA PO), Take 2 capsules by mouth daily. Nutriferon by [employer], Disp: , Rfl:    Misc Natural Products (JOINT HEALTH PO), Take 2 tablets by mouth daily., Disp: , Rfl:    Multiple Vitamin (MULTIVITAMIN WITH MINERALS) TABS tablet, Take 2 tablets by mouth daily. Vita-Lea, Disp: , Rfl:    Multiple Vitamins-Minerals (ZINC PO), Take 1 tablet by mouth daily., Disp: , Rfl:    Nutritional Supplements (IMMUNE ENHANCE) TABS, Take 1 tablet by mouth daily., Disp: , Rfl:    Probiotic Product (PROBIOTIC PO), Take 1 capsule by mouth daily., Disp: , Rfl:    RESVERATROL PO, Take 2 capsules by mouth daily. Vivix, Disp: , Rfl:    tamsulosin (FLOMAX) 0.4 MG CAPS capsule, TAKE 2 CAPSULES BY MOUTH EVERY DAY, Disp: 180 capsule, Rfl: 3   TURMERIC PO, Take 1 capsule by mouth daily., Disp: , Rfl:    predniSONE (DELTASONE) 5 MG tablet, Take 3 tablets (15 mg total) by mouth daily with breakfast. (Patient not taking: Reported on 04/02/2021), Disp: 90 tablet,  Rfl: 0  Physical exam:  Vitals:   04/02/21 1021  BP: (!) 151/60  Pulse: 60  Resp: 18  Temp: 98.2 F (36.8 C)  TempSrc: Oral  SpO2: 100%  Weight: 215 lb (97.5 kg)   Physical Exam Constitutional:      General: He is not in acute distress.    Comments: Appears more frail as compared to before  Cardiovascular:     Rate and Rhythm: Normal rate and regular rhythm.     Heart sounds: Normal heart sounds.  Pulmonary:     Effort: Pulmonary effort is normal.     Breath sounds: Normal breath sounds.  Abdominal:     General: Bowel sounds are normal.     Palpations: Abdomen is soft.  Skin:    General: Skin is warm and dry.  Neurological:     Mental Status: He is alert and oriented to person, place, and time.     CMP Latest Ref Rng & Units 03/22/2021  Glucose 65 - 99 mg/dL 107(H)  BUN 8 - 27 mg/dL 19  Creatinine 0.76 - 1.27 mg/dL 0.93  Sodium 134 - 144 mmol/L 137  Potassium 3.5 - 5.2 mmol/L 4.4  Chloride 96 - 106 mmol/L 101  CO2 20 - 29 mmol/L 20  Calcium 8.6 - 10.2 mg/dL 8.5(L)  Total Protein 6.0 - 8.5 g/dL 6.1  Total Bilirubin 0.0 - 1.2 mg/dL 0.2  Alkaline Phos 44 - 121 IU/L 64  AST 0 - 40 IU/L 15  ALT 0 - 44 IU/L 13   CBC Latest Ref Rng & Units 04/02/2021  WBC 4.0 - 10.5 K/uL 8.3  Hemoglobin 13.0 - 17.0 g/dL 9.1(L)  Hematocrit 39.0 - 52.0 % 27.9(L)  Platelets 150 - 400 K/uL 338     Assessment and plan- Patient is a 87 y.o. male who is here for following issues:  Metastatic castrate sensitive prostate cancer:Patient had gone for second opinion to see Dr. Silva Bandy at [facility] as well.  He was again told the same thing that we could wait and watch and continue to observe his PSA.  However when the PSA level reaches between 15-25 range there is a chance of worsening disease.  His PSA was 5.1 a year ago and today its 14.8.  PSA doubling time is roughly 6 to 7 months.  Given his worsening fatigue and rising PSA which  is now up to 14.8 I will proceed with a CT chest abdomen and pelvis  with contrast and a bone scan.  He has chosen not to receive any Lupron or Eligard at this time.  He is not getting any active treatment for his prostate cancer as such.  2.  Normocytic anemia: Iron studies show elevated ferritin, low TIBCAnd low serum iron which is indicative of anemia of chronic disease rather than true iron deficiency.  We would typically see an elevated TIBC and low ferritin and iron deficiency anemia.  B12 levels were elevated at 1550.  Haptoglobin level is elevated and therefore not indicated of hemolysis.  LDH was normal.  TSH was normal.  Both ESR and CRP are elevated concerning for some kind of an acute inflammatory process that is causing her transient bone marrow suppression and worsening his anemia.  Kidney functions are normal.  Patient was prescribed empiric steroid treatment by Dr. Frederico Hamman given his myalgias.  Patient has not picked it up yet.  I am checking CBC folate ANA comprehensive panel reticulocyte count and CK levels today.  I suspect his anemia is due to an acute inflammatory process and he does not clearly have iron deficiency based on his labs and would not benefit from IV iron.  He is complaining of some symptoms of UTI and we did check UA and urine culture today.  Urinalysis was concerning for a UTI and we will be sending over prescription for Bactrim DS twice daily for 7 days.  He also has an appointment with urology tomorrow.  I am inclined to monitor his anemia conservatively without a bone marrow biopsy at this time.  No evidence of nucleated red blood cells in his peripheral smear indicative of any bone marrow involvement with prostate cancer that would explain his anemia.  I will see him back for a video visit after his scans are done and decide further management at that time.  Given that he has concerning findings for active UTI he will hold off on starting his steroids at this time at least until he completes his antibiotic course.  3.  Constipation: I have  encouraged him to take MiraLAX and senna twice a day.  If he does not have a bowel movement in the next 1 to 2 days we could consider adding lactulose.    Total face to face encounter time for this patient visit was 35 min.     Visit Diagnosis 1. Normocytic anemia   2. Prostate cancer ([place])   3. Dysuria   4. Elevated C-reactive protein (CRP)   5. Folate deficiency      Dr. Randa Evens, MD, MPH [facility] [phone] 04/02/2021 1:07 PM

## 2021-04-02 NOTE — Telephone Encounter (Signed)
Tried contacting pt to make him aware of bactrum prescription sent to his pharmacy regarding a UTI from the UA he had done today. Pt did not answer, left a VM with details along with a callback number in case of any questions or concerns.

## 2021-04-03 ENCOUNTER — Ambulatory Visit (INDEPENDENT_AMBULATORY_CARE_PROVIDER_SITE_OTHER): Payer: Medicare Other | Admitting: Urology

## 2021-04-03 VITALS — BP 131/71 | HR 106 | Ht 67.5 in | Wt 205.0 lb

## 2021-04-03 DIAGNOSIS — R3989 Other symptoms and signs involving the genitourinary system: Secondary | ICD-10-CM

## 2021-04-03 DIAGNOSIS — C61 Malignant neoplasm of prostate: Secondary | ICD-10-CM

## 2021-04-03 LAB — ANA COMPREHENSIVE PANEL

## 2021-04-03 MED ORDER — SULFAMETHOXAZOLE-TRIMETHOPRIM 800-160 MG PO TABS: 1.0000 | ORAL_TABLET | Freq: Two times a day (BID) | ORAL | 0 refills | Status: AC

## 2021-04-03 NOTE — Progress Notes (Signed)
   04/03/2021 4:02 PM   Cesar Powell [birth year] [id number]  Reason for visit: Follow up metastatic prostate cancer, urinary symptoms/UTI  HPI: I saw Cesar Powell back in urology clinic for follow-up of prostate cancer.  He is a very complex 87 year old male with a long history of prostate cancer.  To briefly summarize, he was originally treated with radiation for prostate cancer in 2005 at [facility](original Gleason score and PSA unavailable to me), his PSA reportedly dropped to 0 after treatment, however then PSA started to rise and he was started on Lupron.  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  He refused further ADT secondary to the Powell effects.  He was discussed at tumor board and imaging in December 2020 with a PET scan suggested retroperitoneal, bilateral common iliac, and right external iliac lymph node involvement, as well as prostatic involvement.  He ultimately opted for external beam radiation to the lymph nodes, and brachytherapy with Dr. Donella Stade.  He also restarted Eligard with Dr. Janese Banks in February 2021.  He remains adamant that he does not want any further ADT, which is certainly understandable with his Powell effects.  PSA trend is difficult to interpret with his history of intermittent ADT, and recent radiation treatment.  He is adamant he does not want to resume ADT secondary to Powell effects.   PSA was 14.8 03/26/2021, 10.1 in June 2022, 3.86 in November 2021, 5.7 in November 2020.  He was seen by oncology yesterday, and diagnosed with UTI.  He has not been feeling well over the last month with fatigue, decreased energy, and urinary symptoms with dysuria, urgency, frequency, and feeling of incomplete emptying.  Urinalysis yesterday was grossly concerning for infection, and was sent for culture.  He was started on Bactrim.  Staging imaging was also ordered by his oncologist at [facility] Dr. Janese Banks with CT chest abdomen and  pelvis with contrast, I personally viewed and interpreted those images that show resolved retroperitoneal bilateral iliac lymph nodes, however new enlarged lymph nodes in the more superior retroperitoneum worrisome for nodal metastatic disease with mixed response from prior imaging in December 2020.  He also met with an oncologist at [facility], who agreed with Dr. Elroy Channel recommendation of observation, and the controversies and option surrounding advanced prostate cancer.  I had a long conversation with the patient about his continued increase in PSA and definite metastatic disease on CT.  We also discussed the relationship between acute UTI and false elevation in the PSA, and this may explain his significant PSA rise over the last few months.  I recommended repeating the PSA in 1 month after treatment of his acute UTI.   -Agree with Bactrim for treatment of acute UTI, follow-up cultures -Defer any additional systemic treatment in this patient with advanced prostate cancer and new metastatic disease to oncology -Repeat PSA 1 month after treatment of UTI, call with those results -Continue Flomax  I spent 45 total minutes on the day of the encounter including pre-visit review of the medical record, face-to-face time with the patient, and post visit ordering of labs/imaging/tests.  Billey Co, [facility] [address] [place], [place] [address] [phone]

## 2021-04-05 LAB — URINE CULTURE: Culture: >=100000 — AB

## 2021-04-08 ENCOUNTER — Encounter
Admission: RE | Admit: 2021-04-08 | Discharge: 2021-04-08 | Disposition: A | Discharge status: Home / Self Care | Payer: Medicare Other | Source: Ambulatory Visit | Attending: Oncology | Admitting: Oncology

## 2021-04-08 ENCOUNTER — Other Ambulatory Visit: Payer: Self-pay

## 2021-04-08 DIAGNOSIS — N3289 Other specified disorders of bladder: Secondary | ICD-10-CM | POA: Diagnosis not present

## 2021-04-08 DIAGNOSIS — R972 Elevated prostate specific antigen [PSA]: Secondary | ICD-10-CM | POA: Diagnosis not present

## 2021-04-08 DIAGNOSIS — C61 Malignant neoplasm of prostate: Secondary | ICD-10-CM | POA: Insufficient documentation

## 2021-04-08 DIAGNOSIS — M545 Low back pain, unspecified: Secondary | ICD-10-CM | POA: Diagnosis not present

## 2021-04-08 DIAGNOSIS — Z8546 Personal history of malignant neoplasm of prostate: Secondary | ICD-10-CM | POA: Diagnosis not present

## 2021-04-08 MED ORDER — TECHNETIUM TC 99M MEDRONATE IV KIT
21.6100 | PACK | Freq: Once | INTRAVENOUS | Status: AC | PRN
  Administered 2021-04-08: 21.61 via INTRAVENOUS

## 2021-04-09 ENCOUNTER — Ambulatory Visit: Payer: Medicare Other | Admitting: Oncology

## 2021-04-09 ENCOUNTER — Inpatient Hospital Stay: Payer: Medicare Other | Attending: Oncology | Admitting: Oncology

## 2021-04-09 DIAGNOSIS — C61 Malignant neoplasm of prostate: Secondary | ICD-10-CM | POA: Diagnosis not present

## 2021-04-09 DIAGNOSIS — D649 Anemia, unspecified: Secondary | ICD-10-CM

## 2021-04-09 DIAGNOSIS — D638 Anemia in other chronic diseases classified elsewhere: Secondary | ICD-10-CM | POA: Diagnosis not present

## 2021-04-11 ENCOUNTER — Other Ambulatory Visit: Payer: Self-pay | Admitting: *Deleted

## 2021-04-11 DIAGNOSIS — C61 Malignant neoplasm of prostate: Secondary | ICD-10-CM

## 2021-04-13 ENCOUNTER — Other Ambulatory Visit: Payer: Self-pay

## 2021-04-13 ENCOUNTER — Inpatient Hospital Stay
Admission: EM | Admit: 2021-04-13 | Discharge: 2021-04-16 | DRG: 683 | Disposition: A | Discharge status: Home / Self Care | Payer: Medicare Other | Attending: Internal Medicine | Admitting: Internal Medicine

## 2021-04-13 ENCOUNTER — Encounter: Payer: Self-pay | Admitting: Oncology

## 2021-04-13 ENCOUNTER — Emergency Department: Payer: Medicare Other

## 2021-04-13 ENCOUNTER — Encounter: Payer: Self-pay | Admitting: Radiology

## 2021-04-13 DIAGNOSIS — Z923 Personal history of irradiation: Secondary | ICD-10-CM

## 2021-04-13 DIAGNOSIS — Z1611 Resistance to penicillins: Secondary | ICD-10-CM | POA: Diagnosis present

## 2021-04-13 DIAGNOSIS — G4733 Obstructive sleep apnea (adult) (pediatric): Secondary | ICD-10-CM | POA: Diagnosis present

## 2021-04-13 DIAGNOSIS — E872 Acidosis: Secondary | ICD-10-CM | POA: Diagnosis present

## 2021-04-13 DIAGNOSIS — N139 Obstructive and reflux uropathy, unspecified: Secondary | ICD-10-CM | POA: Diagnosis present

## 2021-04-13 DIAGNOSIS — R339 Retention of urine, unspecified: Secondary | ICD-10-CM

## 2021-04-13 DIAGNOSIS — N35812 Other urethral bulbous stricture, male: Secondary | ICD-10-CM | POA: Diagnosis not present

## 2021-04-13 DIAGNOSIS — Z9841 Cataract extraction status, right eye: Secondary | ICD-10-CM | POA: Diagnosis not present

## 2021-04-13 DIAGNOSIS — E669 Obesity, unspecified: Secondary | ICD-10-CM | POA: Diagnosis present

## 2021-04-13 DIAGNOSIS — W19XXXA Unspecified fall, initial encounter: Secondary | ICD-10-CM | POA: Diagnosis present

## 2021-04-13 DIAGNOSIS — Z20822 Contact with and (suspected) exposure to covid-19: Secondary | ICD-10-CM | POA: Diagnosis present

## 2021-04-13 DIAGNOSIS — Z9842 Cataract extraction status, left eye: Secondary | ICD-10-CM | POA: Diagnosis not present

## 2021-04-13 DIAGNOSIS — I959 Hypotension, unspecified: Secondary | ICD-10-CM | POA: Diagnosis not present

## 2021-04-13 DIAGNOSIS — Z91018 Allergy to other foods: Secondary | ICD-10-CM | POA: Diagnosis not present

## 2021-04-13 DIAGNOSIS — R778 Other specified abnormalities of plasma proteins: Secondary | ICD-10-CM

## 2021-04-13 DIAGNOSIS — Z8249 Family history of ischemic heart disease and other diseases of the circulatory system: Secondary | ICD-10-CM

## 2021-04-13 DIAGNOSIS — Z79899 Other long term (current) drug therapy: Secondary | ICD-10-CM | POA: Diagnosis not present

## 2021-04-13 DIAGNOSIS — Z801 Family history of malignant neoplasm of trachea, bronchus and lung: Secondary | ICD-10-CM | POA: Diagnosis not present

## 2021-04-13 DIAGNOSIS — C61 Malignant neoplasm of prostate: Secondary | ICD-10-CM | POA: Diagnosis not present

## 2021-04-13 DIAGNOSIS — N281 Cyst of kidney, acquired: Secondary | ICD-10-CM | POA: Diagnosis not present

## 2021-04-13 DIAGNOSIS — Z8546 Personal history of malignant neoplasm of prostate: Secondary | ICD-10-CM | POA: Diagnosis not present

## 2021-04-13 DIAGNOSIS — R531 Weakness: Secondary | ICD-10-CM | POA: Diagnosis not present

## 2021-04-13 DIAGNOSIS — R7989 Other specified abnormal findings of blood chemistry: Secondary | ICD-10-CM | POA: Diagnosis not present

## 2021-04-13 DIAGNOSIS — E871 Hypo-osmolality and hyponatremia: Secondary | ICD-10-CM | POA: Diagnosis present

## 2021-04-13 DIAGNOSIS — D649 Anemia, unspecified: Secondary | ICD-10-CM | POA: Diagnosis present

## 2021-04-13 DIAGNOSIS — N390 Urinary tract infection, site not specified: Secondary | ICD-10-CM | POA: Diagnosis present

## 2021-04-13 DIAGNOSIS — Z6832 Body mass index (BMI) 32.0-32.9, adult: Secondary | ICD-10-CM | POA: Diagnosis not present

## 2021-04-13 DIAGNOSIS — I10 Essential (primary) hypertension: Secondary | ICD-10-CM | POA: Diagnosis present

## 2021-04-13 DIAGNOSIS — T368X5A Adverse effect of other systemic antibiotics, initial encounter: Secondary | ICD-10-CM | POA: Diagnosis present

## 2021-04-13 DIAGNOSIS — E875 Hyperkalemia: Secondary | ICD-10-CM | POA: Diagnosis not present

## 2021-04-13 DIAGNOSIS — Y92009 Unspecified place in unspecified non-institutional (private) residence as the place of occurrence of the external cause: Secondary | ICD-10-CM | POA: Diagnosis not present

## 2021-04-13 DIAGNOSIS — Z961 Presence of intraocular lens: Secondary | ICD-10-CM | POA: Diagnosis present

## 2021-04-13 DIAGNOSIS — I248 Other forms of acute ischemic heart disease: Secondary | ICD-10-CM | POA: Diagnosis present

## 2021-04-13 DIAGNOSIS — N401 Enlarged prostate with lower urinary tract symptoms: Secondary | ICD-10-CM

## 2021-04-13 DIAGNOSIS — Z87891 Personal history of nicotine dependence: Secondary | ICD-10-CM

## 2021-04-13 DIAGNOSIS — N35912 Unspecified bulbous urethral stricture, male: Secondary | ICD-10-CM | POA: Diagnosis present

## 2021-04-13 DIAGNOSIS — N179 Acute kidney failure, unspecified: Secondary | ICD-10-CM | POA: Diagnosis not present

## 2021-04-13 DIAGNOSIS — K5900 Constipation, unspecified: Secondary | ICD-10-CM | POA: Diagnosis present

## 2021-04-13 DIAGNOSIS — R338 Other retention of urine: Secondary | ICD-10-CM | POA: Diagnosis not present

## 2021-04-13 DIAGNOSIS — D631 Anemia in chronic kidney disease: Secondary | ICD-10-CM | POA: Diagnosis not present

## 2021-04-13 LAB — CBC WITH DIFFERENTIAL/PLATELET
Abs Immature Granulocytes: 0.07 10*3/uL (ref 0.00–0.07)
Basophils Absolute: 0 10*3/uL (ref 0.0–0.1)
Basophils Relative: 0 %
Eosinophils Absolute: 0 10*3/uL (ref 0.0–0.5)
Eosinophils Relative: 0 %
HCT: 25.5 % — ABNORMAL LOW (ref 39.0–52.0)
Hemoglobin: 8.8 g/dL — ABNORMAL LOW (ref 13.0–17.0)
Immature Granulocytes: 1 %
Lymphocytes Relative: 2 %
Lymphs Abs: 0.3 10*3/uL — ABNORMAL LOW (ref 0.7–4.0)
MCH: 30.2 pg (ref 26.0–34.0)
MCHC: 34.5 g/dL (ref 30.0–36.0)
MCV: 87.6 fL (ref 80.0–100.0)
Monocytes Absolute: 0.8 10*3/uL (ref 0.1–1.0)
Monocytes Relative: 6 %
Neutro Abs: 12.4 10*3/uL — ABNORMAL HIGH (ref 1.7–7.7)
Neutrophils Relative %: 91 %
Platelets: 318 10*3/uL (ref 150–400)
RBC: 2.91 MIL/uL — ABNORMAL LOW (ref 4.22–5.81)
RDW: 14.2 % (ref 11.5–15.5)
WBC: 13.6 10*3/uL — ABNORMAL HIGH (ref 4.0–10.5)
nRBC: 0 % (ref 0.0–0.2)

## 2021-04-13 LAB — URINALYSIS, COMPLETE (UACMP) WITH MICROSCOPIC
Bilirubin Urine: NEGATIVE
Glucose, UA: NEGATIVE mg/dL
Ketones, ur: NEGATIVE mg/dL
Nitrite: NEGATIVE
Protein, ur: NEGATIVE mg/dL
Specific Gravity, Urine: 1.01 (ref 1.005–1.030)
Squamous Epithelial / HPF: NONE SEEN (ref 0–5)
pH: 5 (ref 5.0–8.0)

## 2021-04-13 LAB — COMPREHENSIVE METABOLIC PANEL
ALT: 30 U/L (ref 0–44)
AST: 32 U/L (ref 15–41)
Albumin: 3.3 g/dL — ABNORMAL LOW (ref 3.5–5.0)
Alkaline Phosphatase: 63 U/L (ref 38–126)
Anion gap: 15 (ref 5–15)
BUN: 98 mg/dL — ABNORMAL HIGH (ref 8–23)
CO2: 17 mmol/L — ABNORMAL LOW (ref 22–32)
Calcium: 8.6 mg/dL — ABNORMAL LOW (ref 8.9–10.3)
Chloride: 90 mmol/L — ABNORMAL LOW (ref 98–111)
Creatinine, Ser: 9.85 mg/dL — ABNORMAL HIGH (ref 0.61–1.24)
GFR, Estimated: 5 mL/min — ABNORMAL LOW (ref >60)
Glucose, Bld: 92 mg/dL (ref 70–99)
Potassium: 6.4 mmol/L (ref 3.5–5.1)
Sodium: 122 mmol/L — ABNORMAL LOW (ref 135–145)
Total Bilirubin: 0.7 mg/dL (ref 0.3–1.2)
Total Protein: 6.7 g/dL (ref 6.5–8.1)

## 2021-04-13 LAB — TROPONIN I (HIGH SENSITIVITY): Troponin I (High Sensitivity): 206 ng/L (ref <18)

## 2021-04-13 LAB — CBC
HCT: 25.4 % — ABNORMAL LOW (ref 39.0–52.0)
HCT: 26.8 % — ABNORMAL LOW (ref 39.0–52.0)
Hemoglobin: 8.9 g/dL — ABNORMAL LOW (ref 13.0–17.0)
Hemoglobin: 9.2 g/dL — ABNORMAL LOW (ref 13.0–17.0)
MCH: 31 pg (ref 26.0–34.0)
MCH: 30.2 pg (ref 26.0–34.0)
MCHC: 35 g/dL (ref 30.0–36.0)
MCHC: 34.3 g/dL (ref 30.0–36.0)
MCV: 88.5 fL (ref 80.0–100.0)
MCV: 87.9 fL (ref 80.0–100.0)
Platelets: 298 10*3/uL (ref 150–400)
Platelets: 233 10*3/uL (ref 150–400)
RBC: 2.87 MIL/uL — ABNORMAL LOW (ref 4.22–5.81)
RBC: 3.05 MIL/uL — ABNORMAL LOW (ref 4.22–5.81)
RDW: 13.9 % (ref 11.5–15.5)
RDW: 14 % (ref 11.5–15.5)
WBC: 13.6 10*3/uL — ABNORMAL HIGH (ref 4.0–10.5)
WBC: 12.7 10*3/uL — ABNORMAL HIGH (ref 4.0–10.5)
nRBC: 0 % (ref 0.0–0.2)
nRBC: 0 % (ref 0.0–0.2)

## 2021-04-13 LAB — LACTIC ACID, PLASMA: Lactic Acid, Venous: 0.9 mmol/L (ref 0.5–1.9)

## 2021-04-13 LAB — LIPASE, BLOOD: Lipase: 40 U/L (ref 11–51)

## 2021-04-13 LAB — BRAIN NATRIURETIC PEPTIDE: B Natriuretic Peptide: 481.6 pg/mL — ABNORMAL HIGH (ref 0.0–100.0)

## 2021-04-13 MED ORDER — VITAMIN D 25 MCG (1000 UNIT) PO TABS
2000.0000 [IU] | ORAL_TABLET | Freq: Every day | ORAL | Status: DC
  Administered 2021-04-14 – 2021-04-16 (×3): 2000 [IU] via ORAL
  Filled 2021-04-13 (×3): qty 2

## 2021-04-13 MED ORDER — NITROGLYCERIN 2 % TD OINT
0.5000 [in_us] | TOPICAL_OINTMENT | Freq: Once | TRANSDERMAL | Status: AC
  Administered 2021-04-13: 0.5 [in_us] via TOPICAL
  Filled 2021-04-13: qty 1

## 2021-04-13 MED ORDER — ONDANSETRON HCL 4 MG/2ML IJ SOLN: 4.0000 mg | Freq: Four times a day (QID) | INTRAMUSCULAR | Status: DC | PRN

## 2021-04-13 MED ORDER — ADULT MULTIVITAMIN W/MINERALS CH
1.0000 | ORAL_TABLET | Freq: Every day | ORAL | Status: DC
  Administered 2021-04-14 – 2021-04-16 (×3): 1 via ORAL
  Filled 2021-04-13 (×3): qty 1

## 2021-04-13 MED ORDER — TAMSULOSIN HCL 0.4 MG PO CAPS: 0.8000 mg | ORAL_CAPSULE | Freq: Every day | ORAL | Status: DC

## 2021-04-13 MED ORDER — SODIUM BICARBONATE 8.4 % IV SOLN
50.0000 meq | Freq: Once | INTRAVENOUS | Status: AC
  Administered 2021-04-13: 50 meq via INTRAVENOUS
  Filled 2021-04-13: qty 50

## 2021-04-13 MED ORDER — POLYETHYLENE GLYCOL 3350 17 G PO PACK: 17.0000 g | PACK | Freq: Every day | ORAL | Status: DC | PRN

## 2021-04-13 MED ORDER — CEFTRIAXONE SODIUM 2 G IJ SOLR
2.0000 g | INTRAMUSCULAR | Status: DC
Start: 2021-04-13 — End: 2021-04-16
  Administered 2021-04-14 – 2021-04-15 (×3): 2 g via INTRAVENOUS
  Filled 2021-04-13: qty 20
  Filled 2021-04-13: qty 2
  Filled 2021-04-13 (×3): qty 20

## 2021-04-13 MED ORDER — DOCUSATE SODIUM 100 MG PO CAPS: 100.0000 mg | ORAL_CAPSULE | Freq: Two times a day (BID) | ORAL | Status: DC | PRN

## 2021-04-13 MED ORDER — SODIUM CHLORIDE 0.9 % IV SOLN: Freq: Once | INTRAVENOUS | Status: AC

## 2021-04-13 MED ORDER — HEPARIN SODIUM (PORCINE) 5000 UNIT/ML IJ SOLN
5000.0000 [IU] | Freq: Three times a day (TID) | INTRAMUSCULAR | Status: DC
  Administered 2021-04-13 – 2021-04-16 (×8): 5000 [IU] via SUBCUTANEOUS
  Filled 2021-04-13 (×8): qty 1

## 2021-04-13 MED ORDER — ASPIRIN 81 MG PO CHEW
324.0000 mg | CHEWABLE_TABLET | Freq: Once | ORAL | Status: AC
  Administered 2021-04-13: 324 mg via ORAL
  Filled 2021-04-13: qty 4

## 2021-04-13 MED ORDER — ASCORBIC ACID 500 MG PO TABS
1000.0000 mg | ORAL_TABLET | Freq: Every day | ORAL | Status: DC
  Administered 2021-04-14 – 2021-04-16 (×3): 1000 mg via ORAL
  Filled 2021-04-13 (×3): qty 2

## 2021-04-13 NOTE — ED Provider Notes (Addendum)
[facility] Emergency Department Provider Note   ____________________________________________   Event Date/Time   First MD Initiated Contact with Patient 04/13/21 2056     (approximate)  I have reviewed the triage vital signs and the nursing notes.   HISTORY  Chief Complaint Wheezing, Constipation, Urinary Retention, and Dizziness    HPI Cesar Powell is a 87 y.o. male who has a history of prostate cancer.  He reports he has been feeling poorly for the last several days lightheaded and weak.  He has a little bit of upper abdominal pain good deal of lower abdominal pain has not urinated in 24 hours.  Patient felt unsteady and lightheaded and fell once but did not hit his head or injure himself.  Abdominal pain is gradually worsening moderately severe in the lower abdomen worse with palpation         Past Medical History:  Diagnosis Date   H/O prostate cancer    was treated with radiation   Hypertension    Local recurrence of prostate cancer ([place]) 06/21/2004   Qualifier: Diagnosis of  By: Fuller Plan CMA (AAMA), Lugene     Obstructive sleep apnea 06/22/2007   NPSG [place] 1979 Unattended Home sleep Study- 05/10/14- Confirms severe OSA, AHI 41.8/ hr, weight 230 pounds CPAP and also Transcend portable CPAP auto 8-15     Sleep apnea 1977   uses C-pap     Patient Active Problem List   Diagnosis Date Noted   Goals of care, counseling/discussion 08/08/2019   Prostate cancer ([place]) 08/08/2019   Subtrochanteric fracture of femur ([place]) 10/08/2014   Essential hypertension 123456   METABOLIC SYNDROME X [id number]   Obstructive sleep apnea 06/22/2007   Local recurrence of prostate cancer ([place]) 06/22/2007   DIVERTICULITIS, HX OF 06/22/2007    Past Surgical History:  Procedure Laterality Date   ABDOMINAL SURGERY     ruptured intestines    CATARACT EXTRACTION W/PHACO Left 01/29/2016   Procedure: CATARACT EXTRACTION PHACO AND INTRAOCULAR LENS  PLACEMENT ([place]) left eye;  Surgeon: Leandrew Koyanagi, MD;  Location: [place];  Service: Ophthalmology;  Laterality: Left;  RESTOR LENS   CATARACT EXTRACTION W/PHACO Right 10/21/2016   Procedure: CATARACT EXTRACTION PHACO AND INTRAOCULAR LENS PLACEMENT (I[facility])  Right restor toric lens;  Surgeon: Leandrew Koyanagi, MD;  Location: [place];  Service: Ophthalmology;  Laterality: Right;  restor toric lens   CYSTOSCOPY WITH LITHOLAPAXY N/A 08/14/2016   Procedure: CYSTOSCOPY WITH LITHOLAPAXY;  Surgeon: Nickie Retort, MD;  Location: [facility];  Service: Urology;  Laterality: N/A;   FEMUR IM NAIL Right 10/08/2014   Procedure: INTRAMEDULLARY (IM) NAIL FEMORAL;  Surgeon: Rod Can, MD;  Location: [place];  Service: Orthopedics;  Laterality: Right;  PER DANIELLE 110 MIN   HOLMIUM LASER APPLICATION  [id number]   Procedure: HOLMIUM LASER APPLICATION;  Surgeon: Nickie Retort, MD;  Location: [facility];  Service: Urology;;   OTHER SURGICAL HISTORY  2006   Prostate Radiation Surgery    RADIOACTIVE SEED IMPLANT N/A 10/30/2019   Procedure: RADIOACTIVE SEED IMPLANT/BRACHYTHERAPY IMPLANT;  Surgeon: Billey Co, MD;  Location: [facility];  Service: Urology;  Laterality: N/A;  41 seeds implanted    Prior to Admission medications   Medication Sig Start Date End Date Taking? Authorizing Provider  ascorbic acid (VITAMIN C) 500 MG tablet Take 1,000 mg by mouth daily.    [provider]  Cholecalciferol (VITAMIN D3) 50 MCG (2000 UT) TABS Take 2,000 Units by mouth  daily.    [provider]  Misc Natural Products (IMMUNE FORMULA PO) Take 2 capsules by mouth daily. Nutriferon by [employer], Historical, MD  Misc Natural Products (JOINT HEALTH PO) Take 2 tablets by mouth daily.    [provider]  Multiple Vitamin (MULTIVITAMIN WITH MINERALS) TABS tablet Take 2 tablets by mouth daily. Vita-Lea    [provider]  Multiple Vitamins-Minerals (ZINC PO)  Take 1 tablet by mouth daily.    [provider]  Nutritional Supplements (IMMUNE ENHANCE) TABS Take 1 tablet by mouth daily.    [provider]  Probiotic Product (PROBIOTIC PO) Take 1 capsule by mouth daily.    [provider]  RESVERATROL PO Take 2 capsules by mouth daily. Vivix    [provider]  tamsulosin (FLOMAX) 0.4 MG CAPS capsule TAKE 2 CAPSULES BY MOUTH EVERY DAY 03/28/21   Billey Co, MD  TURMERIC PO Take 1 capsule by mouth daily.    [provider]    Allergies Gluten meal  Family History  Problem Relation Age of Onset   Heart disease Brother    Heart attack Brother    Lung cancer Brother    Prostate cancer Neg Hx    Bladder Cancer Neg Hx    Kidney cancer Neg Hx     Social History Social History   Tobacco Use   Smoking status: Former    Packs/day: 1.00    Years: 15.00    Pack years: 15.00    Types: Cigarettes    Quit date: 09/14/1965    Years since quitting: 55.6   Smokeless tobacco: Never  Vaping Use   Vaping Use: Never used  Substance Use Topics   Alcohol use: Not Currently    Comment: occassiona;    Drug use: No    Review of Systems  Constitutional: No fever/chills Eyes: No visual changes. ENT: No sore throat. Cardiovascular: Denies chest pain. Respiratory: Denies shortness of breath. Gastrointestinal: abdominal pain.  No nausea, no vomiting.  No diarrhea.   Genitourinary: Negative for dysuria. Musculoskeletal: Negative for back pain. Skin: Negative for rash. Neurological: Negative for headaches, focal weakness   ____________________________________________   PHYSICAL EXAM:  VITAL SIGNS: ED Triage Vitals  Enc Vitals Group     BP 04/13/21 2020 (!) 175/73     Pulse Rate 04/13/21 2020 79     Resp 04/13/21 2020 20     Temp 04/13/21 2020 97.7 F (36.5 C)     Temp Source 04/13/21 2020 Oral     SpO2 04/13/21 2020 100 %     Weight 04/13/21 2020 210 lb (95.3 kg)     Height 04/13/21 2020 5'  7" (1.702 m)     Head Circumference --      Peak Flow --      Pain Score 04/13/21 2027 3     Pain Loc --      Pain Edu? --      Excl. in [place]? --    Constitutional: Alert and oriented. Well appearing and in no acute distress! Eyes: Conjunctivae are normal. PER Head: Atraumatic. Nose: No congestion/rhinnorhea. Mouth/Throat: Mucous membranes are moist.  Oropharynx non-erythematous. Neck: No stridor.  Cardiovascular: Normal rate, regular rhythm. Grossly normal heart sounds.  Good peripheral circulation. Respiratory: Normal respiratory effort.  No retractions. Lungs CTAB.  No wheezing currently Gastrointestinal: Soft minimally tender in the epigastric area much more tender lower over the suprapubic area there is some distention. No abdominal  bruits. No CVA tenderness. Musculoskeletal: No lower extremity tenderness bilateral 1+ edema.   Neurologic:  Normal speech and language. No gross focal neurologic deficits are appreciated. N Skin:  Skin is warm, dry and intact. No rash noted.   ____________________________________________   LABS (all labs ordered are listed, but only abnormal results are displayed)  Labs Reviewed  CBC - Abnormal; Notable for the following components:      Result Value   WBC 13.6 (*)    RBC 2.87 (*)    Hemoglobin 8.9 (*)    HCT 25.4 (*)    All other components within normal limits  COMPREHENSIVE METABOLIC PANEL - Abnormal; Notable for the following components:   Sodium 122 (*)    Potassium 6.4 (*)    Chloride 90 (*)    CO2 17 (*)    BUN 98 (*)    Creatinine, Ser 9.85 (*)    Calcium 8.6 (*)    Albumin 3.3 (*)    GFR, Estimated 5 (*)    All other components within normal limits  BRAIN NATRIURETIC PEPTIDE - Abnormal; Notable for the following components:   B Natriuretic Peptide 481.6 (*)    All other components within normal limits  CBC WITH DIFFERENTIAL/PLATELET - Abnormal; Notable for the following components:   WBC 13.6 (*)    RBC 2.91 (*)     Hemoglobin 8.8 (*)    HCT 25.5 (*)    Neutro Abs 12.4 (*)    Lymphs Abs 0.3 (*)    All other components within normal limits  TROPONIN I (HIGH SENSITIVITY) - Abnormal; Notable for the following components:   Troponin I (High Sensitivity) 206 (*)    All other components within normal limits  RESP PANEL BY RT-PCR (FLU A&B, COVID) ARPGX2  LIPASE, BLOOD  LACTIC ACID, PLASMA  URINALYSIS, COMPLETE (UACMP) WITH MICROSCOPIC  TROPONIN I (HIGH SENSITIVITY)   ____________________________________________  EKG  EKG read interpreted by me shows normal sinus rhythm rate of 67 normal axis no acute ST-T wave changes ____________________________________________  RADIOLOGY Gertha Calkin, personally viewed and evaluated these images (plain radiographs) as part of my medical decision making, as well as reviewing the written report by the radiologist.  ED MD interpretation:    Official radiology report(s): DG Chest Portable 1 View  Result Date: 04/13/2021 CLINICAL DATA:  Weakness, fall, prostate cancer EXAM: PORTABLE CHEST 1 VIEW COMPARISON:  CT chest dated 04/02/2021 FINDINGS: Lungs are clear.  No pleural effusion or pneumothorax. The heart is normal in size. Thoracic aortic atherosclerosis. IMPRESSION: No evidence of acute cardiopulmonary disease. Electronically Signed   By: Julian Hy M.D.   On: 04/13/2021 21:31    ____________________________________________   PROCEDURES  Procedure(s) performed (including Critical Care): Critical care time 20 minutes.  This includes examining the patient and reviewing his old records including the recent CT of the abdomen pelvis from about 2 weeks ago and is cussing his case with the urologist and then the hospitalist.  Procedures   ____________________________________________   INITIAL IMPRESSION / ASSESSMENT AND PLAN / ED COURSE  ----------------------------------------- 9:24 PM on 04/13/2021 ----------------------------------------- I  discussed the patient with Dr. Abner Greenspan on-call for urology.  He is asked for the cystoscopy is cart and he is coming in.  We have obtained the urology cart cannot actually see a cystoscope on it but there is a lot of equipment and sealed packages and a light source.  Hopefully this is adequate.  Dr. Abner Greenspan is on his way.  Patient is aware.    ----------------------------------------- 10:29 PM on 04/13/2021 ----------------------------------------- Labs returned.  He has hyponatremia and hyperkalemia and acute renal failure all likely because of his inability to pass urine.  He is at almost 2500 cc out so far.  Additionally his BNP is elevated he is of course edematous.  And his troponin is up.  These may also only be due to his inability to pass urine however I will give him aspirin and some nitro paste as his blood pressure still somewhat elevated 123456 systolic at this point.  And I will get him in the hospital for further evaluation and possibly further treatment if his labs do not normalize very soon.  I will give him some bicarbonate IV as his T waves are slightly peaked he on initial EKG likely due to his hyperkalemia.         ____________________________________________   FINAL CLINICAL IMPRESSION(S) / ED DIAGNOSES  Final diagnoses:  Urinary retention  Elevated troponin  Elevated brain natriuretic peptide (BNP) level  Hyponatremia  Hyperkalemia  AKI (acute kidney injury) [facility])     ED Discharge Orders     None        Note:  This document was prepared using Dragon voice recognition software and may include unintentional dictation errors.    Nena Polio, MD 04/13/21 2125    Nena Polio, MD 04/13/21 2230

## 2021-04-13 NOTE — Consult Note (Signed)
Urology Consult   Physician requesting consult: Conni Slipper, MD  Reason for consult: Acute urinary retention, difficult foley cathter placement  History of Present Illness: Cesar Powell is a 87 y.o. with a history of metastatic castrate resistant prostate cancer initially diagnosed with 2005.  He underwent radiation therapy at that time.  He was then started on intermittent ADT.  He received pleated external beam radiation therapy to lymph nodes and brachytherapy in December 2020.  He is currently no longer on ADT due to complaints of bothersome side effects.  Last PSA on 03/27/2019 was 14.8.  He was recently seen by Dr. Caprice Beaver in the office on 04/03/2021 and recent diagnosed with a UTI.  He has since completed antibiotic therapy for this.  He presented the ED today from what he reports is a week history of inability to void.  Bladder scan resulted greater than 1 L.  Attempts for Foley catheter placement by nursing staff were unsuccessful.  He complains of suprapubic pain.  He denies abdominal pain or flank pain.  He denies fevers or chills.  Past Medical History:  Diagnosis Date   H/O prostate cancer    was treated with radiation   Hypertension    Local recurrence of prostate cancer ([place]) 06/21/2004   Qualifier: Diagnosis of  By: Fuller Plan CMA (AAMA), Lugene     Obstructive sleep apnea 06/22/2007   NPSG [place] 1979 Unattended Home sleep Study- 05/10/14- Confirms severe OSA, AHI 41.8/ hr, weight 230 pounds CPAP and also Transcend portable CPAP auto 8-15     Sleep apnea 1977   uses C-pap     Past Surgical History:  Procedure Laterality Date   ABDOMINAL SURGERY     ruptured intestines    CATARACT EXTRACTION W/PHACO Left 01/29/2016   Procedure: CATARACT EXTRACTION PHACO AND INTRAOCULAR LENS PLACEMENT ([place]) left eye;  Surgeon: Leandrew Koyanagi, MD;  Location: [place];  Service: Ophthalmology;  Laterality: Left;  RESTOR LENS   CATARACT EXTRACTION W/PHACO Right 10/21/2016    Procedure: CATARACT EXTRACTION PHACO AND INTRAOCULAR LENS PLACEMENT ([facility])  Right restor toric lens;  Surgeon: Leandrew Koyanagi, MD;  Location: [place];  Service: Ophthalmology;  Laterality: Right;  restor toric lens   CYSTOSCOPY WITH LITHOLAPAXY N/A 08/14/2016   Procedure: CYSTOSCOPY WITH LITHOLAPAXY;  Surgeon: Nickie Retort, MD;  Location: [facility];  Service: Urology;  Laterality: N/A;   FEMUR IM NAIL Right 10/08/2014   Procedure: INTRAMEDULLARY (IM) NAIL FEMORAL;  Surgeon: Rod Can, MD;  Location: [place];  Service: Orthopedics;  Laterality: Right;  PER DANIELLE 110 MIN   HOLMIUM LASER APPLICATION  123456   Procedure: HOLMIUM LASER APPLICATION;  Surgeon: Nickie Retort, MD;  Location: [facility];  Service: Urology;;   OTHER SURGICAL HISTORY  2006   Prostate Radiation Surgery    RADIOACTIVE SEED IMPLANT N/A 10/30/2019   Procedure: RADIOACTIVE SEED IMPLANT/BRACHYTHERAPY IMPLANT;  Surgeon: Billey Co, MD;  Location: [facility];  Service: Urology;  Laterality: N/A;  41 seeds implanted    Medications:  Home meds:  No current facility-administered medications on file prior to encounter.   Current Outpatient Medications on File Prior to Encounter  Medication Sig Dispense Refill   ascorbic acid (VITAMIN C) 500 MG tablet Take 1,000 mg by mouth daily.     Cholecalciferol (VITAMIN D3) 50 MCG (2000 UT) TABS Take 2,000 Units by mouth daily.     Misc Natural Products (IMMUNE FORMULA PO) Take 2 capsules by mouth daily. Nutriferon by [employer]  Misc Natural Products (JOINT HEALTH PO) Take 2 tablets by mouth daily.     Multiple Vitamin (MULTIVITAMIN WITH MINERALS) TABS tablet Take 2 tablets by mouth daily. Vita-Lea     Multiple Vitamins-Minerals (ZINC PO) Take 1 tablet by mouth daily.     Nutritional Supplements (IMMUNE ENHANCE) TABS Take 1 tablet by mouth daily.     Probiotic Product (PROBIOTIC PO) Take 1 capsule by mouth daily.     RESVERATROL PO Take 2 capsules by mouth  daily. Vivix     tamsulosin (FLOMAX) 0.4 MG CAPS capsule TAKE 2 CAPSULES BY MOUTH EVERY DAY 180 capsule 3   TURMERIC PO Take 1 capsule by mouth daily.       Scheduled Meds: Continuous Infusions: PRN Meds:.  Allergies:  Allergies  Allergen Reactions   Gluten Meal Rash    Family History  Problem Relation Age of Onset   Heart disease Brother    Heart attack Brother    Lung cancer Brother    Prostate cancer Neg Hx    Bladder Cancer Neg Hx    Kidney cancer Neg Hx     Social History:  reports that he quit smoking about 55 years ago. His smoking use included cigarettes. He has a 15.00 pack-year smoking history. He has never used smokeless tobacco. He reports that he does not currently use alcohol. He reports that he does not use drugs.  ROS: A complete review of systems was performed.  All systems are negative except for pertinent findings as noted.  Physical Exam:  Vital signs in last 24 hours: Temp:  [97.7 F (36.5 C)] 97.7 F (36.5 C) (09/11 2020) Pulse Rate:  [79] 79 (09/11 2129) Resp:  [18-20] 18 (09/11 2129) BP: (175-178)/(62-73) 178/62 (09/11 2129) SpO2:  [95 %-100 %] 95 % (09/11 2129) Weight:  [95.3 kg] 95.3 kg (09/11 2020) Constitutional:  Alert and oriented, No acute distress Cardiovascular: Regular rate and rhythm Respiratory: Normal respiratory effort, Lungs clear bilaterally GI: Abdomen is soft, nontender, nondistended, no abdominal masses Genitourinary: No CVAT.  Neurologic: Grossly intact, no focal deficits Psychiatric: Normal mood and affect  Laboratory Data:  Recent Labs    04/13/21 2022  WBC 13.6*  HGB 8.9*  HCT 25.4*  PLT 298    No results for input(s): NA, K, CL, GLUCOSE, BUN, CALCIUM, CREATININE in the last 72 hours.  Invalid input(s): CO3   Results for orders placed or performed during the hospital encounter of 04/13/21 (from the past 24 hour(s))  CBC     Status: Abnormal   Collection Time: 04/13/21  8:22 PM  Result Value Ref Range    WBC 13.6 (H) 4.0 - 10.5 K/uL   RBC 2.87 (L) 4.22 - 5.81 MIL/uL   Hemoglobin 8.9 (L) 13.0 - 17.0 g/dL   HCT 25.4 (L) 39.0 - 52.0 %   MCV 88.5 80.0 - 100.0 fL   MCH 31.0 26.0 - 34.0 pg   MCHC 35.0 30.0 - 36.0 g/dL   RDW 13.9 11.5 - 15.5 %   Platelets 298 150 - 400 K/uL   nRBC 0.0 0.0 - 0.2 %   No results found for this or any previous visit (from the past 240 hour(s)).  Renal Function: No results for input(s): CREATININE in the last 168 hours. CrCl cannot be calculated (Patient's most recent lab result is older than the maximum 21 days allowed.).  Radiologic Imaging: DG Chest Portable 1 View  Result Date: 04/13/2021 CLINICAL DATA:  Weakness, fall, prostate cancer EXAM: PORTABLE  CHEST 1 VIEW COMPARISON:  CT chest dated 04/02/2021 FINDINGS: Lungs are clear.  No pleural effusion or pneumothorax. The heart is normal in size. Thoracic aortic atherosclerosis. IMPRESSION: No evidence of acute cardiopulmonary disease. Electronically Signed   By: Julian Hy M.D.   On: 04/13/2021 21:31    I independently reviewed the above imaging studies.  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 -Under cystoscopic guidance, I was able to pass a wire into the true lumen were identified evidence of bulbar urethral stricture.  This wire was passed into the bladder.  I then passed the cystoscope into the bladder.  Surveillance was limited by debris. -A 16 [place] catheter was placed over the wire with return of greater than 1 L of clear yellow urine. -Leave Foley catheter to gravity for at least 1 week. -I recommend obtaining BMP to evaluate for AKI and disposition per ED/internal medicine pending that result.  Monitor for postobstructive  diuresis. -I will notify Dr. Diamantina Providence of this visit.  Message schedulers to arrange for follow-up for void trial in approximately 1 week.  Matt R. Gerldine Suleiman MD 04/13/2021, 9:57 PM  [facility]  Pager: [phone]   CC: Conni Slipper, MD

## 2021-04-13 NOTE — ED Notes (Signed)
MD at the bedside  

## 2021-04-13 NOTE — ED Notes (Signed)
Dr. Cinda Quest at the bedside speaking with pt and daughter

## 2021-04-13 NOTE — ED Notes (Signed)
Urologist unable to place foley using just a coude catheter. Scope used to place foley. Pt tolerated well.

## 2021-04-13 NOTE — ED Triage Notes (Addendum)
Pt with prostate cancer, pt states he is lightheaed today and fell once, pt also complains of no urine in 24 hours, constipation, dizziness, wheezing. Pt with distended abd noted, wheezing noted. Pt also complains of upper abd pain as well. Bladder scan in triage greater than 10106m.

## 2021-04-13 NOTE — ED Notes (Signed)
Urologist at the bedside to place foley catheter

## 2021-04-13 NOTE — ED Notes (Signed)
Attempted multiple times to insert catheter with coude, 51fwithout success, cannot pass prostate at this time. Will need to attempt with different size outside of triage.

## 2021-04-13 NOTE — ED Notes (Signed)
Port CXR performed 

## 2021-04-13 NOTE — Progress Notes (Signed)
I connected with Cesar Powell on 04/13/21 at  2:30 PM EDT by video enabled telemedicine visit and verified that I am speaking with the correct person using two identifiers.   I discussed the limitations, risks, security and privacy concerns of performing an evaluation and management service by telemedicine and the availability of in-person appointments. I also discussed with the patient that there may be a patient responsible charge related to this service. The patient expressed understanding and agreed to proceed.  Other persons participating in the visit and their role in the encounter:  none  Patient's location:  home Provider's location:  work  Risk analyst Complaint: Discuss CT scan results and further management  History of present illness: patient is a 87 year old gentleman with no significant past medical problems.  He has a history of prostate cancer that was diagnosed and treated with radiation in 2005.  At that point he his PSA was about 0.03 for a long time.  Then started on Axiron for low testosterone.  PSA eventually went up to as high as 18 2015.  At that point he was started on Lupron his PSA doubling time is around 10 months. Most recently since October 2018 after his PSA went down from 3.6-1.7 and has been gradually increasing to 1.3,1.5 and 2 indicating a PSA doubling time of 9.9 months.  Testosterone levels continue to be suppressed.  He has not started oral anti androgen therapy yet.    patient was last seen by me in September 2019 discussed adding oral antiandrogens like apalutamide versus enzalutamide to Lupron in castrate resistant nonmetastatic prostate cancer.  After discussing risks and benefits patient did not wish to proceed.Patient stopped lupron in feb 2020 but did restart after 1 year in February 2021.  Patient received IMRT for his pelvic lymph nodes.   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   Review of Systems  Constitutional:  Positive for malaise/fatigue. Negative for chills, fever and weight loss.  HENT:  Negative for congestion, ear discharge and nosebleeds.   Eyes:  Negative for blurred vision.  Respiratory:  Negative for cough, hemoptysis, sputum production, shortness of breath and wheezing.   Cardiovascular:  Negative for chest pain, palpitations, orthopnea and claudication.  Gastrointestinal:  Negative for abdominal pain, blood in stool, constipation, diarrhea, heartburn, melena, nausea and vomiting.  Genitourinary:  Negative for dysuria, flank pain, frequency, hematuria and urgency.  Musculoskeletal:  Negative for back pain, joint pain and myalgias.  Skin:  Negative for rash.  Neurological:  Negative for dizziness, tingling, focal weakness, seizures, weakness and headaches.  Endo/Heme/Allergies:  Does not bruise/bleed easily.  Psychiatric/Behavioral:  Negative for depression and suicidal ideas. The patient does not have insomnia.    Allergies  Allergen Reactions   Gluten Meal Rash    Past Medical History:  Diagnosis Date   H/O prostate cancer    was treated with radiation   Hypertension    Local recurrence of prostate cancer ([place]) 06/21/2004   Qualifier: Diagnosis of  By: Fuller Plan CMA (AAMA), [place]     Obstructive sleep apnea 06/22/2007   NPSG [place] 1979 Unattended Home sleep Study- 05/10/14- Confirms severe OSA, AHI 41.8/ hr, weight 230 pounds CPAP and also Transcend portable CPAP auto 8-15     Sleep apnea 1977   uses C-pap     Past Surgical History:  Procedure Laterality Date  ABDOMINAL SURGERY     ruptured intestines    CATARACT EXTRACTION W/PHACO Left 01/29/2016   Procedure: CATARACT EXTRACTION PHACO AND INTRAOCULAR LENS PLACEMENT ([place]) left eye;  Surgeon: Leandrew Koyanagi, MD;  Location: [place];  Service:  Ophthalmology;  Laterality: Left;  RESTOR LENS   CATARACT EXTRACTION W/PHACO Right 10/21/2016   Procedure: CATARACT EXTRACTION PHACO AND INTRAOCULAR LENS PLACEMENT ([facility])  Right restor toric lens;  Surgeon: Leandrew Koyanagi, MD;  Location: [place];  Service: Ophthalmology;  Laterality: Right;  restor toric lens   CYSTOSCOPY WITH LITHOLAPAXY N/A 08/14/2016   Procedure: CYSTOSCOPY WITH LITHOLAPAXY;  Surgeon: Nickie Retort, MD;  Location: [facility];  Service: Urology;  Laterality: N/A;   FEMUR IM NAIL Right 10/08/2014   Procedure: INTRAMEDULLARY (IM) NAIL FEMORAL;  Surgeon: Rod Can, MD;  Location: [place];  Service: Orthopedics;  Laterality: Right;  PER DANIELLE 110 MIN   HOLMIUM LASER APPLICATION  [id number]   Procedure: HOLMIUM LASER APPLICATION;  Surgeon: Nickie Retort, MD;  Location: [facility];  Service: Urology;;   OTHER SURGICAL HISTORY  2006   Prostate Radiation Surgery    RADIOACTIVE SEED IMPLANT N/A 10/30/2019   Procedure: RADIOACTIVE SEED IMPLANT/BRACHYTHERAPY IMPLANT;  Surgeon: Billey Co, MD;  Location: [facility];  Service: Urology;  Laterality: N/A;  36 seeds implanted    Social History   Socioeconomic History   Marital status: Married    Spouse name: Not on file   Number of children: Not on file   Years of education: Not on file   Highest education level: Not on file  Occupational History   Occupation: retired  Tobacco Use   Smoking status: Former    Packs/day: 1.00    Years: 15.00    Pack years: 15.00    Types: Cigarettes    Quit date: 09/14/1965    Years since quitting: 55.6   Smokeless tobacco: Never  Vaping Use   Vaping Use: Never used  Substance and Sexual Activity   Alcohol use: Not Currently    Comment: occassiona;    Drug use: No   Sexual activity: Yes    Birth control/protection: None  Other Topics Concern   Not on file  Social History Narrative   Not on file   Social Determinants of Health   Financial Resource Strain: Not  on file  Food Insecurity: Not on file  Transportation Needs: Not on file  Physical Activity: Not on file  Stress: Not on file  Social Connections: Not on file  Intimate Partner Violence: Not on file    Family History  Problem Relation Age of Onset   Heart disease Brother    Heart attack Brother    Lung cancer Brother    Prostate cancer Neg Hx    Bladder Cancer Neg Hx    Kidney cancer Neg Hx      Current Outpatient Medications:    ascorbic acid (VITAMIN C) 500 MG tablet, Take 1,000 mg by mouth daily., Disp: , Rfl:    Cholecalciferol (VITAMIN D3) 50 MCG (2000 UT) TABS, Take 2,000 Units by mouth daily., Disp: , Rfl:    Misc Natural Products (IMMUNE FORMULA PO), Take 2 capsules by mouth daily. Nutriferon by [employer], Disp: , Rfl:    Misc Natural Products (JOINT HEALTH PO), Take 2 tablets by mouth daily., Disp: , Rfl:    Multiple Vitamin (MULTIVITAMIN WITH MINERALS) TABS tablet, Take 2 tablets by mouth daily. Vita-Lea, Disp: , Rfl:    Multiple Vitamins-Minerals (ZINC  PO), Take 1 tablet by mouth daily., Disp: , Rfl:    Nutritional Supplements (IMMUNE ENHANCE) TABS, Take 1 tablet by mouth daily., Disp: , Rfl:    Probiotic Product (PROBIOTIC PO), Take 1 capsule by mouth daily., Disp: , Rfl:    RESVERATROL PO, Take 2 capsules by mouth daily. Vivix, Disp: , Rfl:    tamsulosin (FLOMAX) 0.4 MG CAPS capsule, TAKE 2 CAPSULES BY MOUTH EVERY DAY, Disp: 180 capsule, Rfl: 3   TURMERIC PO, Take 1 capsule by mouth daily., Disp: , Rfl:   NM Bone Scan Whole Body  Result Date: 04/08/2021 CLINICAL DATA:  History of prostate cancer. Lower left back pain for 1 week. No known injury. Rising serum PSA levels. EXAM: NUCLEAR MEDICINE WHOLE BODY BONE SCAN TECHNIQUE: Whole body anterior and posterior images were obtained approximately 3 hours after intravenous injection of radiopharmaceutical. RADIOPHARMACEUTICALS:  21.61 mCi Technetium-5mMDP IV COMPARISON:  Whole-body bone scan 07/08/2020. CTs of the chest,  abdomen and pelvis 04/02/2021 and lumbar MRI 08/31/2020. FINDINGS: There is prominent bladder activity which partially obscures the lower lumbar spine and pelvis. Some urine contamination is noted in the perineal region. The previously demonstrated prominent uptake in the lower lumbar spine has improved, likely due to degenerative disc disease at L3-4 on previous lumbar MRI. There is new mildly increased uptake at T8. In correlation with the recent chest CT, there is mild sclerosis within the T8 vertebral body which appears new compared with previous CT from 04/06/2018. No other suspicious osseous uptake is identified. There is mild degenerative uptake in the shoulders, knees and feet. The soft tissue activity is otherwise unremarkable. IMPRESSION: 1. New mildly increased uptake in the T8 vertebral body, corresponding with mild sclerosis on recent CT, suspicious for metastatic disease. 2. The previously demonstrated uptake in the lower lumbar spine has improved, corresponding with degenerative disc disease on previous MRI. No other evidence of metastatic disease. 3. Distended bladder. Electronically Signed   By: WRichardean SaleM.D.   On: 04/08/2021 13:14   CT CHEST ABDOMEN PELVIS W CONTRAST  Result Date: 04/02/2021 CLINICAL DATA:  Follow-up prostate cancer EXAM: CT CHEST, ABDOMEN, AND PELVIS WITH CONTRAST TECHNIQUE: Multidetector CT imaging of the chest, abdomen and pelvis was performed following the standard protocol during bolus administration of intravenous contrast. CONTRAST:  1054mOMNIPAQUE IOHEXOL 350 MG/ML SOLN COMPARISON:  PET-CT, 08/01/2019 FINDINGS: CT CHEST FINDINGS Cardiovascular: Aortic atherosclerosis. Normal heart size. Three-vessel coronary artery calcifications. No pericardial effusion. Mediastinum/Nodes: Newly enlarged periaortic lymph nodes about the descending thoracic aorta, measuring up to 1.5 x 0.9 cm (series 2, image 50). No other enlarged mediastinal, hilar, or axillary lymph nodes.  Thyroid gland, trachea, and esophagus demonstrate no significant findings. Lungs/Pleura: Lungs are clear. No pleural effusion or pneumothorax. Musculoskeletal: No chest wall mass or suspicious bone lesions identified. CT ABDOMEN PELVIS FINDINGS Hepatobiliary: No solid liver abnormality is seen. No gallstones, gallbladder wall thickening, or biliary dilatation. Pancreas: Unremarkable. No pancreatic ductal dilatation or surrounding inflammatory changes. Spleen: Normal in size without significant abnormality. Adrenals/Urinary Tract: Adrenal glands are unremarkable. Kidneys are normal, without renal calculi, solid lesion, or hydronephrosis. Mildly thickened urinary bladder wall. Stomach/Bowel: Stomach is within normal limits. Appendix appears normal. No evidence of bowel wall thickening, distention, or inflammatory changes. Sigmoid diverticulosis. Vascular/Lymphatic: No significant vascular findings are present. Previously noted enlarged, radiotracer avid retroperitoneal and bilateral iliac lymph nodes are resolved, however there are newly enlarged lymph nodes in the more superior retroperitoneum at the level of the renal arteries (series  2, image 74). These nodes measure up to 1.8 x 1.2 cm. Reproductive: Mild prostatomegaly.  Prostate brachytherapy pellets. Other: No abdominal wall hernia or abnormality. No abdominopelvic ascites. Musculoskeletal: No acute or significant osseous findings. IMPRESSION: 1. Previously noted enlarged, radiotracer avid retroperitoneal and bilateral iliac lymph nodes are resolved, however there are newly enlarged lymph nodes in the more superior retroperitoneum at the level of the renal arteries as well as about the descending thoracic aorta. Findings are concerning for recurrent nodal metastatic disease with a mixed response to treatment in the interval to prior examination dated 08/01/2019. 2. Mild prostatomegaly with brachytherapy pellets. 3. Mildly thickened urinary bladder wall, likely  due to chronic outlet obstruction. 4. Coronary artery disease. Aortic Atherosclerosis (ICD10-I70.0). Electronically Signed   By: Eddie Candle M.D.   On: 04/02/2021 16:01    No images are attached to the encounter.   CMP Latest Ref Rng & Units 03/22/2021  Glucose 65 - 99 mg/dL 107(H)  BUN 8 - 27 mg/dL 19  Creatinine 0.76 - 1.27 mg/dL 0.93  Sodium 134 - 144 mmol/L 137  Potassium 3.5 - 5.2 mmol/L 4.4  Chloride 96 - 106 mmol/L 101  CO2 20 - 29 mmol/L 20  Calcium 8.6 - 10.2 mg/dL 8.5(L)  Total Protein 6.0 - 8.5 g/dL 6.1  Total Bilirubin 0.0 - 1.2 mg/dL 0.2  Alkaline Phos 44 - 121 IU/L 64  AST 0 - 40 IU/L 15  ALT 0 - 44 IU/L 13   CBC Latest Ref Rng & Units 04/02/2021  WBC 4.0 - 10.5 K/uL 8.3  Hemoglobin 13.0 - 17.0 g/dL 9.1(L)  Hematocrit 39.0 - 52.0 % 27.9(L)  Platelets 150 - 400 K/uL 338     Observation/objective: Appears in no acute distress over video visit today.  Breathing is nonlabored  Assessment and plan: Patient is a 87 year old male with history of castrate sensitive metastatic prostate cancer with metastases to retroperitoneal lymph nodes and this is a visit to discuss CT scan results and further management  with regards to his prostate cancer patient has not been on any ADT anymore. Patient states that he could not tolerate Eligard in the past.  He is willing to consider Lupron in the future if need be but not Eligard which I again explained to him is the same drug in essence.CT chest abdomen pelvis with contrast shows new areas of retroperitoneal adenopathy with resolution of previously noted retroperitoneal lymph nodes and bilateral iliac lymph nodes.  New mild uptake in T8 vertebral body concerning for metastatic disease but no other areas of involvement.  Overall patient has prostate cancer which is progressing slowly.His most recent PSA was 14.8 but that could also be falsely elevated in the setting of UTI and he has a repeat PSA to be checked by Dr. Diamantina Providence next  week.  With regards to his anemiaI suspect this is acute anemia secondary to bone marrow suppression possibly secondary to underlying UTI.  A complete anemia work-up did not reveal any evidence of iron or B12 deficiency.  No evidence of hemolysis.  CRP elevated at 62 but no other evidence of infection found on CT scan.  ANA comprehensive panel was also negative.  His repeat CBC showed mild improvement in his anemia with a hemoglobin of 9.1 improved as compared to 8.7 prior.  Baseline hemoglobin runs around 10.  I will plan to check a repeat CBC when he comes for his blood work with urology on 05/05/2021.  Otherwise I will see him back in  early January 2023 with CBC with differential CMP and PSA.  If there is a consistent increasing trend in his PSA-I will reinitiate the discussion of ADT with him  Follow-up instructions: As above  I discussed the assessment and treatment plan with the patient. The patient was provided an opportunity to ask questions and all were answered. The patient agreed with the plan and demonstrated an understanding of the instructions.   The patient was advised to call back or seek an in-person evaluation if the symptoms worsen or if the condition fails to improve as anticipated. Visit Diagnosis: 1. Prostate cancer ([place])   2. Normocytic anemia   3. Anemia of chronic disease     Dr. Randa Evens, MD, MPH [facility] Tel- [phone] 04/13/2021 6:15 PM

## 2021-04-13 NOTE — ED Notes (Signed)
Call [name], daughter, with bed assignment

## 2021-04-13 NOTE — H&P (Signed)
NAME:  Cesar Powell, MRN:  [id number], DOB:  Dec 16, 1930, LOS: 0 ADMISSION DATE:  04/13/2021, CONSULTATION DATE: 04/13/2021 REFERRING MD: Dr. Cinda Quest, CHIEF COMPLAINT: Urinary Retention   History of Present Illness:  This is an 87 yo male who presented to [facility] ER on 09/11 with c/o several day hx of lightheadedness and weakness.  He also reported mild upper abdominal pain, but moderate lower abdominal pain and has been unable to void in the last 24 hrs.  Due to lightheadedness he did fall, but did not hit his head or injure himself.    He has a hx of metastatic castrate resistant prostate cancer initially diagnosed in 2005 and underwent radiation therapy.  He was then started on intermittent ADT and received pleated external beam radiation therapy to lymph nodes and brachytherapy in Dec. 2020.  He is no longer on ADT due to side effects.  He recently saw his outpatient urologist Dr. Diamantina Providence on 09/1 and was diagnosed with a UTI and prescribed Bactrim which he has completed the course.   ED Course Upon arrival to the ER lab results revealed Na+ 122, K+ 6.4, chloride 90, CO2 17, BUN 98, creatinine 9.85, calcium 8.6, troponin 206, wbc 13.6, hgb 8.9, BNP 481.6, and lactic acid 0.9.  UA concerning for UTI.  EKG revealed sinus rhythm with slightly peaked T waves.  On call urologist contacted by ER physician due to pts urinary retention and difficult foley catheter placement.  Urologist able to insert a 16 Fr indwelling catheter with return of greater than 1L of clear yellow urine. Pt also hypertensive sbp 160-170's.  He received 324 mg aspirin, 0.5 inch nitropaste, and 1 amp sodium bicarb.  PCCM team contacted for hospital admission.   Pertinent  Medical History  Metastatic castrate resistant prostate cancer (2005) HTN  OSA~wears CPAP qhs   Significant Hospital Events: Including procedures, antibiotic start and stop dates in addition to other pertinent events   09/11: Pt admitted to ICU with acute  kidney injury with hyperkalemia secondary to urinary retention and UTI   Interim History / Subjective:  No complaints at this time   Objective   Blood pressure (!) 147/88, pulse 66, temperature 97.7 F (36.5 C), temperature source Oral, resp. rate 17, height '5\' 7"'$  (1.702 m), weight 95.3 kg, SpO2 100 %.        Intake/Output Summary (Last 24 hours) at 04/13/2021 2306 Last data filed at 04/13/2021 2200 Gross per 24 hour  Intake --  Output 2700 ml  Net -2700 ml   Filed Weights   04/13/21 2020  Weight: 95.3 kg    Examination: General: well developed, well nourished male, NAD resting in bed  HENT: supple, no JVD  Lungs: clear throughout, even, non labored  Cardiovascular: sinus rhythm, rrr, no R/G, 2+ radial/1+ distal pulses, no edema  Abdomen: +BS x4, soft, non tender, non distended  Extremities: moves all extremities, LLE chronic vascular discoloration  Neuro: alert and oriented, follows commands, PERRLA  GU: indwelling foley catheter in place draining yellow urine   Resolved Hospital Problem list     Assessment & Plan:   OSA - CPAP qhs  Acute kidney injury with hyperkalemia and metabolic acidosis in the setting of urinary retention in pt with advanced prostate cancer  Hyponatremia: Na+122~124 - Trend BMP  - Replace electrolytes as indicated  - Strict intake/output  - Avoid nephrotoxic medications  - Renal US pending  - Nephrology consulted appreciate input  - Urology consulted appreciate input~per recommendations leave  indwelling catheter in place to gravity for at lease 1 week and follow-up with outpatient urologist in 1 week   Elevated troponin likely secondary to demand ischemia in the setting of acute illness: troponin 206~191 Hypertension  - Continuous telemetry monitoring  - Trend troponin's  - prn hydralazine for bp management   Leukocytosis secondary to possible UTI  - Trend WBC and monitor fever curve  - Trend PCT  - Follow cultures  - Will start  empiric ceftriaxone   Anemia without acute blood loss  - Trend CBC  - Monitor for s/sx of bleeding  - Transfuse for hgb <7   Best Practice (right click and "Reselect all SmartList Selections" daily)   Diet/type: Regular consistency (see orders) DVT prophylaxis: prophylactic heparin  GI prophylaxis: N/A Lines: N/A Foley:  Yes, and it is still needed Code Status:  full code Last date of multidisciplinary goals of care discussion [N/A]  Labs   CBC: Recent Labs  Lab 04/13/21 2006 04/13/21 2022  WBC 13.6* 13.6*  NEUTROABS 12.4*  --   HGB 8.8* 8.9*  HCT 25.5* 25.4*  MCV 87.6 88.5  PLT 318 [id number]    Basic Metabolic Panel: Recent Labs  Lab 04/13/21 2022  NA 122*  K 6.4*  CL 90*  CO2 17*  GLUCOSE 92  BUN 98*  CREATININE 9.85*  CALCIUM 8.6*   GFR: Estimated Creatinine Clearance: 5.7 mL/min (A) (by C-G formula based on SCr of 9.85 mg/dL (H)). Recent Labs  Lab 04/13/21 2006 04/13/21 2022 04/13/21 2128  WBC 13.6* 13.6*  --   LATICACIDVEN  --   --  0.9    Liver Function Tests: Recent Labs  Lab 04/13/21 2022  AST 32  ALT 30  ALKPHOS 63  BILITOT 0.7  PROT 6.7  ALBUMIN 3.3*   Recent Labs  Lab 04/13/21 2128  LIPASE 40   No results for input(s): AMMONIA in the last 168 hours.  ABG No results found for: PHART, PCO2ART, PO2ART, HCO3, TCO2, ACIDBASEDEF, O2SAT   Coagulation Profile: No results for input(s): INR, PROTIME in the last 168 hours.  Cardiac Enzymes: No results for input(s): CKTOTAL, CKMB, CKMBINDEX, TROPONINI in the last 168 hours.  HbA1C: Hgb A1c MFr Bld  Date/Time Value Ref Range Status  06/12/2020 11:05 AM 6.1 4.6 - 6.5 % Final    Comment:    Glycemic Control Guidelines for People with Diabetes:Non Diabetic:  <6%Goal of Therapy: <7%Additional Action Suggested:  >8%     CBG: No results for input(s): GLUCAP in the last 168 hours.  Review of Systems: Positives in BOLD   Gen: Denies fever, chills, weight change, fatigue, night  sweats HEENT: Denies blurred vision, double vision, hearing loss, tinnitus, sinus congestion, rhinorrhea, sore throat, neck stiffness, dysphagia PULM: Denies shortness of breath, cough, sputum production, hemoptysis, wheezing CV: Denies chest pain, edema, orthopnea, paroxysmal nocturnal dyspnea, palpitations GI: abdominal pain, nausea, vomiting, diarrhea, hematochezia, melena, constipation, change in bowel habits GU: dysuria, hematuria, polyuria, oliguria, urethral discharge Endocrine: Denies hot or cold intolerance, polyuria, polyphagia or appetite change Derm: Denies rash, dry skin, scaling or peeling skin change Heme: Denies easy bruising, bleeding, bleeding gums Neuro: headache, numbness, weakness, lightheadedness, slurred speech, loss of memory or consciousness  Past Medical History:  He,  has a past medical history of H/O prostate cancer, Hypertension, Local recurrence of prostate cancer ([place]) (06/21/2004), Obstructive sleep apnea (06/22/2007), and Sleep apnea (1977).   Surgical History:   Past Surgical History:  Procedure Laterality Date   ABDOMINAL  SURGERY     ruptured intestines    CATARACT EXTRACTION W/PHACO Left 01/29/2016   Procedure: CATARACT EXTRACTION PHACO AND INTRAOCULAR LENS PLACEMENT ([place]) left eye;  Surgeon: Leandrew Koyanagi, MD;  Location: [place];  Service: Ophthalmology;  Laterality: Left;  RESTOR LENS   CATARACT EXTRACTION W/PHACO Right 10/21/2016   Procedure: CATARACT EXTRACTION PHACO AND INTRAOCULAR LENS PLACEMENT ([facility])  Right restor toric lens;  Surgeon: Leandrew Koyanagi, MD;  Location: [place];  Service: Ophthalmology;  Laterality: Right;  restor toric lens   CYSTOSCOPY WITH LITHOLAPAXY N/A 08/14/2016   Procedure: CYSTOSCOPY WITH LITHOLAPAXY;  Surgeon: Nickie Retort, MD;  Location: [facility];  Service: Urology;  Laterality: N/A;   FEMUR IM NAIL Right 10/08/2014   Procedure: INTRAMEDULLARY (IM) NAIL FEMORAL;  Surgeon: Rod Can,  MD;  Location: [place];  Service: Orthopedics;  Laterality: Right;  PER DANIELLE 110 MIN   HOLMIUM LASER APPLICATION  [id number]   Procedure: HOLMIUM LASER APPLICATION;  Surgeon: Nickie Retort, MD;  Location: [facility];  Service: Urology;;   OTHER SURGICAL HISTORY  2006   Prostate Radiation Surgery    RADIOACTIVE SEED IMPLANT N/A 10/30/2019   Procedure: RADIOACTIVE SEED IMPLANT/BRACHYTHERAPY IMPLANT;  Surgeon: Billey Co, MD;  Location: [facility];  Service: Urology;  Laterality: N/A;  41 seeds implanted     Social History:   reports that he quit smoking about 55 years ago. His smoking use included cigarettes. He has a 15.00 pack-year smoking history. He has never used smokeless tobacco. He reports that he does not currently use alcohol. He reports that he does not use drugs.   Family History:  His family history includes Heart attack in his brother; Heart disease in his brother; Lung cancer in his brother. There is no history of Prostate cancer, Bladder Cancer, or Kidney cancer.   Allergies Allergies  Allergen Reactions   Gluten Meal Rash     Home Medications  Prior to Admission medications   Medication Sig Start Date End Date Taking? Authorizing Provider  ascorbic acid (VITAMIN C) 500 MG tablet Take 1,000 mg by mouth daily.   Yes [provider]  Cholecalciferol (VITAMIN D3) 50 MCG (2000 UT) TABS Take 2,000 Units by mouth daily.   Yes [provider]  Misc Natural Products (IMMUNE FORMULA PO) Take 2 capsules by mouth daily. Nutriferon by [employer]   Yes [provider]  Misc Natural Products (JOINT HEALTH PO) Take 2 tablets by mouth daily.   Yes [provider]  Multiple Vitamin (MULTIVITAMIN WITH MINERALS) TABS tablet Take 2 tablets by mouth daily. Vita-Lea   Yes [provider]  Multiple Vitamins-Minerals (ZINC PO) Take 1 tablet by mouth daily.   Yes [provider]  Nutritional Supplements (IMMUNE ENHANCE) TABS Take 1 tablet  by mouth daily.   Yes [provider]  Probiotic Product (PROBIOTIC PO) Take 1 capsule by mouth daily.   Yes [provider]  RESVERATROL PO Take 2 capsules by mouth daily. Vivix   Yes [provider]  tamsulosin (FLOMAX) 0.4 MG CAPS capsule TAKE 2 CAPSULES BY MOUTH EVERY DAY 03/28/21  Yes Billey Co, MD  TURMERIC PO Take 1 capsule by mouth daily.   Yes [provider]  sulfamethoxazole-trimethoprim (BACTRIM DS) 800-160 MG tablet Take 1 tablet by mouth 2 (two) times daily. Patient not taking: No sig reported 04/09/21   [provider]     Critical care time: 45 minutes    Rosilyn Mings, AGNP  Pulmonary/Critical Care Pager [phone] (please enter 7 digits) PCCM Consult Pager [phone] (please enter 7 digits)

## 2021-04-13 NOTE — Procedures (Signed)
Procedure Note  Cystoscopy procedure note:  Patient identification was confirmed, informed consent was obtained, and patient was prepped using Betadine solution.  Lidocaine jelly was administered per urethral meatus.     Pre-Procedure: - Inspection reveals a normal caliber ureteral meatus.  Procedure: The flexible cystoscope was introduced without difficulty -In the bulbar urethra, there is evidence of false passages.  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  The tissues were unhealthy and white appearing consistent with prior radiation effect.  I was able to navigate into the true lumen which was at the 12 o'clock position.  I passed a wire into the bladder and over this wire passed the cystoscope into the bladder.  Cystoscopy was performed however the view was limited given the significant distention of his bladder.  The cystoscope was removed leaving the sensor wire in place.  Over the sensor wire, I passed a 16 [place] council catheter into the bladder with return of over 1 L clear yellow urine.  Post-Procedure: - Patient tolerated the procedure well  Assessment/ Plan: Leave Foley catheter to gravity for at least 1 week.  Monitor for postobstructive diuresis.  [facility]  Pager: [phone]

## 2021-04-14 ENCOUNTER — Inpatient Hospital Stay: Payer: Medicare Other

## 2021-04-14 ENCOUNTER — Encounter: Payer: Self-pay | Admitting: Internal Medicine

## 2021-04-14 ENCOUNTER — Telehealth: Payer: Self-pay | Admitting: Urology

## 2021-04-14 LAB — RESP PANEL BY RT-PCR (FLU A&B, COVID) ARPGX2
Influenza A by PCR: NEGATIVE
Influenza B by PCR: NEGATIVE
SARS Coronavirus 2 by RT PCR: NEGATIVE

## 2021-04-14 LAB — RENAL FUNCTION PANEL
Albumin: 3.2 g/dL — ABNORMAL LOW (ref 3.5–5.0)
Anion gap: 15 (ref 5–15)
BUN: 93 mg/dL — ABNORMAL HIGH (ref 8–23)
CO2: 19 mmol/L — ABNORMAL LOW (ref 22–32)
Calcium: 8.3 mg/dL — ABNORMAL LOW (ref 8.9–10.3)
Chloride: 94 mmol/L — ABNORMAL LOW (ref 98–111)
Creatinine, Ser: 8.12 mg/dL — ABNORMAL HIGH (ref 0.61–1.24)
GFR, Estimated: 6 mL/min — ABNORMAL LOW (ref >60)
Glucose, Bld: 90 mg/dL (ref 70–99)
Phosphorus: 6.2 mg/dL — ABNORMAL HIGH (ref 2.5–4.6)
Potassium: 5.2 mmol/L — ABNORMAL HIGH (ref 3.5–5.1)
Sodium: 128 mmol/L — ABNORMAL LOW (ref 135–145)

## 2021-04-14 LAB — BASIC METABOLIC PANEL
Anion gap: 14 (ref 5–15)
Anion gap: 12 (ref 5–15)
BUN: 93 mg/dL — ABNORMAL HIGH (ref 8–23)
BUN: 81 mg/dL — ABNORMAL HIGH (ref 8–23)
CO2: 17 mmol/L — ABNORMAL LOW (ref 22–32)
CO2: 20 mmol/L — ABNORMAL LOW (ref 22–32)
Calcium: 8.3 mg/dL — ABNORMAL LOW (ref 8.9–10.3)
Calcium: 8.4 mg/dL — ABNORMAL LOW (ref 8.9–10.3)
Chloride: 93 mmol/L — ABNORMAL LOW (ref 98–111)
Chloride: 97 mmol/L — ABNORMAL LOW (ref 98–111)
Creatinine, Ser: 9.11 mg/dL — ABNORMAL HIGH (ref 0.61–1.24)
Creatinine, Ser: 6.33 mg/dL — ABNORMAL HIGH (ref 0.61–1.24)
GFR, Estimated: 5 mL/min — ABNORMAL LOW (ref >60)
GFR, Estimated: 8 mL/min — ABNORMAL LOW (ref >60)
Glucose, Bld: 91 mg/dL (ref 70–99)
Glucose, Bld: 95 mg/dL (ref 70–99)
Potassium: 6.3 mmol/L (ref 3.5–5.1)
Potassium: 5.1 mmol/L (ref 3.5–5.1)
Sodium: 124 mmol/L — ABNORMAL LOW (ref 135–145)
Sodium: 129 mmol/L — ABNORMAL LOW (ref 135–145)

## 2021-04-14 LAB — CBC WITH DIFFERENTIAL/PLATELET
Abs Immature Granulocytes: 0.05 10*3/uL (ref 0.00–0.07)
Basophils Absolute: 0 10*3/uL (ref 0.0–0.1)
Basophils Relative: 0 %
Eosinophils Absolute: 0.1 10*3/uL (ref 0.0–0.5)
Eosinophils Relative: 0 %
HCT: 25.3 % — ABNORMAL LOW (ref 39.0–52.0)
Hemoglobin: 8.9 g/dL — ABNORMAL LOW (ref 13.0–17.0)
Immature Granulocytes: 0 %
Lymphocytes Relative: 3 %
Lymphs Abs: 0.4 10*3/uL — ABNORMAL LOW (ref 0.7–4.0)
MCH: 31 pg (ref 26.0–34.0)
MCHC: 35.2 g/dL (ref 30.0–36.0)
MCV: 88.2 fL (ref 80.0–100.0)
Monocytes Absolute: 0.8 10*3/uL (ref 0.1–1.0)
Monocytes Relative: 7 %
Neutro Abs: 10.4 10*3/uL — ABNORMAL HIGH (ref 1.7–7.7)
Neutrophils Relative %: 90 %
Platelets: 311 10*3/uL (ref 150–400)
RBC: 2.87 MIL/uL — ABNORMAL LOW (ref 4.22–5.81)
RDW: 14 % (ref 11.5–15.5)
WBC: 11.6 10*3/uL — ABNORMAL HIGH (ref 4.0–10.5)
nRBC: 0 % (ref 0.0–0.2)

## 2021-04-14 LAB — GLUCOSE, CAPILLARY
Glucose-Capillary: 67 mg/dL — ABNORMAL LOW (ref 70–99)
Glucose-Capillary: 114 mg/dL — ABNORMAL HIGH (ref 70–99)
Glucose-Capillary: 93 mg/dL (ref 70–99)
Glucose-Capillary: 87 mg/dL (ref 70–99)
Glucose-Capillary: 109 mg/dL — ABNORMAL HIGH (ref 70–99)
Glucose-Capillary: 144 mg/dL — ABNORMAL HIGH (ref 70–99)
Glucose-Capillary: 138 mg/dL — ABNORMAL HIGH (ref 70–99)

## 2021-04-14 LAB — MAGNESIUM: Magnesium: 2.8 mg/dL — ABNORMAL HIGH (ref 1.7–2.4)

## 2021-04-14 LAB — MRSA NEXT GEN BY PCR, NASAL: MRSA by PCR Next Gen: NOT DETECTED

## 2021-04-14 LAB — TROPONIN I (HIGH SENSITIVITY): Troponin I (High Sensitivity): 191 ng/L (ref <18)

## 2021-04-14 MED ORDER — SODIUM BICARBONATE 8.4 % IV SOLN
INTRAVENOUS | Status: DC
  Filled 2021-04-14: qty 150
  Filled 2021-04-14: qty 1000
  Filled 2021-04-14: qty 150

## 2021-04-14 MED ORDER — INSULIN ASPART 100 UNIT/ML IV SOLN
10.0000 [IU] | Freq: Once | INTRAVENOUS | Status: AC
  Administered 2021-04-14: 10 [IU] via INTRAVENOUS
  Filled 2021-04-14: qty 0.1

## 2021-04-14 MED ORDER — DEXTROSE 50 % IV SOLN
1.0000 | Freq: Once | INTRAVENOUS | Status: AC
  Administered 2021-04-14: 50 mL via INTRAVENOUS
  Filled 2021-04-14: qty 50

## 2021-04-14 MED ORDER — HYDRALAZINE HCL 25 MG PO TABS: 25.0000 mg | ORAL_TABLET | Freq: Four times a day (QID) | ORAL | Status: DC | PRN

## 2021-04-14 MED ORDER — CHLORHEXIDINE GLUCONATE CLOTH 2 % EX PADS
6.0000 | MEDICATED_PAD | Freq: Every day | CUTANEOUS | Status: DC
  Administered 2021-04-14 – 2021-04-16 (×3): 6 via TOPICAL

## 2021-04-14 MED ORDER — SODIUM BICARBONATE 8.4 % IV SOLN
50.0000 meq | Freq: Once | INTRAVENOUS | Status: AC
  Administered 2021-04-14: 50 meq via INTRAVENOUS
  Filled 2021-04-14: qty 50

## 2021-04-14 NOTE — Consult Note (Signed)
PHARMACY CONSULT NOTE - FOLLOW UP  Pharmacy Consult for Electrolyte Monitoring and Replacement   Recent Labs: Potassium (mmol/L)  Date Value  04/14/2021 5.2 (H)  03/08/2012 5.8 (H)   Magnesium (mg/dL)  Date Value  04/14/2021 2.8 (H)   Calcium (mg/dL)  Date Value  04/14/2021 8.3 (L)   Calcium, Total (mg/dL)  Date Value  03/08/2012 9.0   Albumin (g/dL)  Date Value  04/14/2021 3.2 (L)  03/22/2021 3.7  03/08/2012 4.2   Phosphorus (mg/dL)  Date Value  04/14/2021 6.2 (H)   Sodium (mmol/L)  Date Value  04/14/2021 128 (L)  03/22/2021 137  03/08/2012 137     Assessment: 87yo Male w/ h/o metastatic prostate cancer (2005; s/p RAD tx), HTN, OSA presenting w/ AKI 2/2 obstructive uropathy c/b hyponatremia, hyperkalemia, & suspected UTI (s/p OP abx 9/1) with w/u for elevated troponins ISO ?demand ischemia. Pharmacy consulted for mgmt of electrolytes and renal adjustment of medications.  9/11: s/p foley for urinary retention; voided >1L.  Scr: 9.85>8.12 Na: 122>124>128 K: 6.4>6.3>5.2 Cl: 90>93>94 Mg: 2.8 Phos: 6.2  Goal of Therapy:  Lytes WNL  Plan:  Foley placed w/ Urology guidance & pt voided > 1L on 9/11. Current lytes are elevated but trending down appropriately. No repletion warranted today; pt s/p couple boluses of NaHCO3.  Lorna Dibble ,PharmD Clinical Pharmacist 04/14/2021 7:20 AM

## 2021-04-14 NOTE — Progress Notes (Signed)
1340 patient called daughter [name] updated that he would be moving to room 257 with in the hour.

## 2021-04-14 NOTE — ED Notes (Signed)
Called ICU to give report. States the RN is currently in an isolation room and will call me back. Provided direct [facility] number

## 2021-04-14 NOTE — Progress Notes (Signed)
[facility] Progress Note Patient Name: Cesar Powell DOB: 05/14/1931 MRN: [id number]   Date of Service  04/14/2021  HPI/Events of Note  Patient admitted with acute kidney injury secondary to obstructive uropathy, hyponatremia, hyperkalemia, suspected UTI, elevated Troponin r/o cardiac ischemia secondary to demand.  eICU Interventions  New Patient Evaluation.        Kapil Petropoulos U [name] 04/14/2021, 1:22 AM

## 2021-04-14 NOTE — Consult Note (Signed)
[facility]  CONSULT NOTE    Date: 04/14/2021                  Patient Name:  Cesar Powell  MRN: [id number]  DOB: 23-Jun-1931  Age / Sex: 87 y.o., male         PCP: Owens Loffler, MD                 Service Requesting Consult: Dr. Mortimer Fries                 Reason for Consult: Acute kidney injury            History of Present Illness: Mr. KJON GERMANI admitted to [facility] ICU with complains of abdominal pain, lightheadedness, and weakness. Patient had a fall. Patient had not been able to void for over 24 hours. He was recently treated with Bactrim for E. Coli urinary tract infection. Indwelling catheter placed. Immediate urine output. Patient states he is feeling well now and is sitting up in bed eating his breakfast.    Medications: Outpatient medications: Medications Prior to Admission  Medication Sig Dispense Refill Last Dose   ascorbic acid (VITAMIN C) 500 MG tablet Take 1,000 mg by mouth daily.   Past Week   Cholecalciferol (VITAMIN D3) 50 MCG (2000 UT) TABS Take 2,000 Units by mouth daily.   Past Week   Misc Natural Products (IMMUNE FORMULA PO) Take 2 capsules by mouth daily. Nutriferon by [employer]   Past Week   Misc Natural Products (JOINT HEALTH PO) Take 2 tablets by mouth daily.   Past Week   Multiple Vitamin (MULTIVITAMIN WITH MINERALS) TABS tablet Take 2 tablets by mouth daily. Vita-Lea   Past Week   Multiple Vitamins-Minerals (ZINC PO) Take 1 tablet by mouth daily.   Past Week   Nutritional Supplements (IMMUNE ENHANCE) TABS Take 1 tablet by mouth daily.   Past Week   Probiotic Product (PROBIOTIC PO) Take 1 capsule by mouth daily.   Past Week   RESVERATROL PO Take 2 capsules by mouth daily. Vivix   Past Week   tamsulosin (FLOMAX) 0.4 MG CAPS capsule TAKE 2 CAPSULES BY MOUTH EVERY DAY 180 capsule 3 Past Week   TURMERIC PO Take 1 capsule by mouth daily.   Past Week   sulfamethoxazole-trimethoprim (BACTRIM DS) 800-160 MG tablet Take 1 tablet by mouth  2 (two) times daily. (Patient not taking: No sig reported)   Completed Course    Current medications: Current Facility-Administered Medications  Medication Dose Route Frequency Provider Last Rate Last Admin   ascorbic acid (VITAMIN C) tablet 1,000 mg  1,000 mg Oral Daily Graves, Dana E, NP       cefTRIAXone (ROCEPHIN) 2 g in sodium chloride 0.9 % 100 mL IVPB  2 g Intravenous Q24H Milus Banister, NP   Stopped at 04/14/21 0038   cholecalciferol (VITAMIN D3) tablet 2,000 Units  2,000 Units Oral Daily Milus Banister, NP       docusate sodium (COLACE) capsule 100 mg  100 mg Oral BID PRN Milus Banister, NP       heparin injection 5,000 Units  5,000 Units Subcutaneous Q8H Graves, Raeford Razor, NP   5,000 Units at 04/14/21 [id number]   hydrALAZINE (APRESOLINE) tablet 25 mg  25 mg Oral Q6H PRN Milus Banister, NP       multivitamin with minerals tablet 1 tablet  1 tablet Oral Daily Milus Banister, NP  ondansetron (ZOFRAN) injection 4 mg  4 mg Intravenous Q6H PRN Graves, Raeford Razor, NP       polyethylene glycol (MIRALAX / GLYCOLAX) packet 17 g  17 g Oral Daily PRN Milus Banister, NP       tamsulosin (FLOMAX) capsule 0.8 mg  0.8 mg Oral Daily Milus Banister, NP          Allergies: Allergies  Allergen Reactions   Gluten Meal Rash      Past Medical History: Past Medical History:  Diagnosis Date   H/O prostate cancer    was treated with radiation   Hypertension    Local recurrence of prostate cancer ([place]) 06/21/2004   Qualifier: Diagnosis of  By: Fuller Plan CMA (AAMA), [place]     Obstructive sleep apnea 06/22/2007   NPSG [place] 1979 Unattended Home sleep Study- 05/10/14- Confirms severe OSA, AHI 41.8/ hr, weight 230 pounds CPAP and also Transcend portable CPAP auto 8-15     Sleep apnea 1977   uses C-pap      Past Surgical History: Past Surgical History:  Procedure Laterality Date   ABDOMINAL SURGERY     ruptured intestines    CATARACT EXTRACTION W/PHACO Left 01/29/2016   Procedure: CATARACT  EXTRACTION PHACO AND INTRAOCULAR LENS PLACEMENT ([place]) left eye;  Surgeon: Leandrew Koyanagi, MD;  Location: [place];  Service: Ophthalmology;  Laterality: Left;  RESTOR LENS   CATARACT EXTRACTION W/PHACO Right 10/21/2016   Procedure: CATARACT EXTRACTION PHACO AND INTRAOCULAR LENS PLACEMENT ([facility])  Right restor toric lens;  Surgeon: Leandrew Koyanagi, MD;  Location: [place];  Service: Ophthalmology;  Laterality: Right;  restor toric lens   CYSTOSCOPY WITH LITHOLAPAXY N/A 08/14/2016   Procedure: CYSTOSCOPY WITH LITHOLAPAXY;  Surgeon: Nickie Retort, MD;  Location: [facility];  Service: Urology;  Laterality: N/A;   FEMUR IM NAIL Right 10/08/2014   Procedure: INTRAMEDULLARY (IM) NAIL FEMORAL;  Surgeon: Rod Can, MD;  Location: [place];  Service: Orthopedics;  Laterality: Right;  PER DANIELLE 110 MIN   HOLMIUM LASER APPLICATION  [id number]   Procedure: HOLMIUM LASER APPLICATION;  Surgeon: Nickie Retort, MD;  Location: [facility];  Service: Urology;;   OTHER SURGICAL HISTORY  2006   Prostate Radiation Surgery    RADIOACTIVE SEED IMPLANT N/A 10/30/2019   Procedure: RADIOACTIVE SEED IMPLANT/BRACHYTHERAPY IMPLANT;  Surgeon: Billey Co, MD;  Location: [facility];  Service: Urology;  Laterality: N/A;  72 seeds implanted     Family History: Family History  Problem Relation Age of Onset   Heart disease Brother    Heart attack Brother    Lung cancer Brother    Prostate cancer Neg Hx    Bladder Cancer Neg Hx    Kidney cancer Neg Hx      Social History: Social History   Socioeconomic History   Marital status: Married    Spouse name: Not on file   Number of children: Not on file   Years of education: Not on file   Highest education level: Not on file  Occupational History   Occupation: retired  Tobacco Use   Smoking status: Former    Packs/day: 1.00    Years: 15.00    Pack years: 15.00    Types: Cigarettes    Quit date: 09/14/1965    Years since quitting:  55.6   Smokeless tobacco: Never  Vaping Use   Vaping Use: Never used  Substance and Sexual Activity   Alcohol use: Not Currently  Comment: occassiona;    Drug use: No   Sexual activity: Yes    Birth control/protection: None  Other Topics Concern   Not on file  Social History Narrative   Not on file   Social Determinants of Health   Financial Resource Strain: Not on file  Food Insecurity: Not on file  Transportation Needs: Not on file  Physical Activity: Not on file  Stress: Not on file  Social Connections: Not on file  Intimate Partner Violence: Not on file     Review of Systems: Review of Systems  Constitutional: Negative.   HENT: Negative.    Eyes: Negative.   Respiratory:  Negative for cough, hemoptysis, sputum production, shortness of breath and wheezing.   Cardiovascular:  Negative for chest pain, palpitations, orthopnea, claudication, leg swelling and PND.  Gastrointestinal: Negative.   Genitourinary:  Negative for dysuria, flank pain, frequency, hematuria and urgency.  Musculoskeletal:  Negative for back pain, falls, joint pain, myalgias and neck pain.  Skin: Negative.   Neurological:  Positive for dizziness.  Endo/Heme/Allergies: Negative.   Psychiatric/Behavioral: Negative.     Vital Signs: Blood pressure (!) 156/64, pulse 65, temperature 98.6 F (37 C), temperature source Axillary, resp. rate 15, height '5\' 7"'$  (1.702 m), weight 95.5 kg, SpO2 99 %.  Weight trends: Filed Weights   04/13/21 2020 04/14/21 0130  Weight: 95.3 kg 95.5 kg    Physical Exam: General: NAD, sitting up in bed  Head: Normocephalic, atraumatic. Moist oral mucosal membranes  Eyes: Anicteric, PERRL  Neck: Supple, trachea midline  Lungs:  Clear to auscultation  Heart: Regular rate and rhythm  Abdomen:  Soft, nontender, obese  Extremities:  no peripheral edema.  Neurologic: Nonfocal, moving all four extremities  Skin: No lesions  GU: +foley catheter     Lab results: Basic  Metabolic Panel: Recent Labs  Lab 04/13/21 2022 04/13/21 2315 04/14/21 0409  NA 122* 124* 128*  K 6.4* 6.3* 5.2*  CL 90* 93* 94*  CO2 17* 17* 19*  GLUCOSE 92 91 90  BUN 98* 93* 93*  CREATININE 9.85* 9.11* 8.12*  CALCIUM 8.6* 8.3* 8.3*  MG  --   --  2.8*  PHOS  --   --  6.2*    Liver Function Tests: Recent Labs  Lab 04/13/21 2022 04/14/21 0409  AST 32  --   ALT 30  --   ALKPHOS 63  --   BILITOT 0.7  --   PROT 6.7  --   ALBUMIN 3.3* 3.2*   Recent Labs  Lab 04/13/21 2128  LIPASE 40   No results for input(s): AMMONIA in the last 168 hours.  CBC: Recent Labs  Lab 04/13/21 2006 04/13/21 2022 04/13/21 2315 04/14/21 0409  WBC 13.6* 13.6* 12.7* 11.6*  NEUTROABS 12.4*  --   --  10.4*  HGB 8.8* 8.9* 9.2* 8.9*  HCT 25.5* 25.4* 26.8* 25.3*  MCV 87.6 88.5 87.9 88.2  PLT 318 298 233 311    Cardiac Enzymes: No results for input(s): CKTOTAL, CKMB, CKMBINDEX, TROPONINI in the last 168 hours.  BNP: Invalid input(s): POCBNP  CBG: Recent Labs  Lab 04/14/21 0109 04/14/21 0218 04/14/21 0349 04/14/21 0733  GLUCAP 67* 114* 93 87    Microbiology: Results for orders placed or performed during the hospital encounter of 04/13/21  Resp Panel by RT-PCR (Flu A&B, Covid) Nasopharyngeal Swab     Status: None   Collection Time: 04/13/21 11:15 PM   Specimen: Nasopharyngeal Swab; Nasopharyngeal(NP) swabs in vial transport medium  Result Value Ref Range Status   SARS Coronavirus 2 by RT PCR NEGATIVE NEGATIVE Final    Comment: (NOTE) SARS-CoV-2 target nucleic acids are NOT DETECTED.  The SARS-CoV-2 RNA is generally detectable in upper respiratory specimens during the acute phase of infection. The lowest concentration of SARS-CoV-2 viral copies this assay can detect is 138 copies/mL. A negative result does not preclude SARS-Cov-2 infection and should not be used as the sole basis for treatment or other patient management decisions. A negative result may occur with   improper specimen collection/handling, submission of specimen other than nasopharyngeal swab, presence of viral mutation(s) within the areas targeted by this assay, and inadequate number of viral copies(<138 copies/mL). A negative result must be combined with clinical observations, patient history, and epidemiological information. The expected result is Negative.  Fact Sheet for Patients:  EntrepreneurPulse.com.au  Fact Sheet for Healthcare Providers:  IncredibleEmployment.be  This test is no t yet approved or cleared by the [place] FDA and  has been authorized for detection and/or diagnosis of SARS-CoV-2 by FDA under an Emergency Use Authorization (EUA). This EUA will remain  in effect (meaning this test can be used) for the duration of the COVID-19 declaration under Section 564(b)(1) of the Act, 21 U.S.C.section 360bbb-3(b)(1), unless the authorization is terminated  or revoked sooner.       Influenza A by PCR NEGATIVE NEGATIVE Final   Influenza B by PCR NEGATIVE NEGATIVE Final    Comment: (NOTE) The Xpert Xpress SARS-CoV-2/FLU/RSV plus assay is intended as an aid in the diagnosis of influenza from Nasopharyngeal swab specimens and should not be used as a sole basis for treatment. Nasal washings and aspirates are unacceptable for Xpert Xpress SARS-CoV-2/FLU/RSV testing.  Fact Sheet for Patients: EntrepreneurPulse.com.au  Fact Sheet for Healthcare Providers: IncredibleEmployment.be  This test is not yet approved or cleared by the [place] FDA and has been authorized for detection and/or diagnosis of SARS-CoV-2 by FDA under an Emergency Use Authorization (EUA). This EUA will remain in effect (meaning this test can be used) for the duration of the COVID-19 declaration under Section 564(b)(1) of the Act, 21 U.S.C. section 360bbb-3(b)(1), unless the authorization is terminated  or revoked.  Performed at [facility], [address] [place], [place] [address]   MRSA Next Gen by PCR, Nasal     Status: None   Collection Time: 04/14/21  6:20 AM   Specimen: Nasal Mucosa; Nasal Swab  Result Value Ref Range Status   MRSA by PCR Next Gen NOT DETECTED NOT DETECTED Final    Comment: (NOTE) The GeneXpert MRSA Assay (FDA approved for NASAL specimens only), is one component of a comprehensive MRSA colonization surveillance program. It is not intended to diagnose MRSA infection nor to guide or monitor treatment for MRSA infections. Test performance is not FDA approved in patients less than 72 years old. Performed at [facility], [address] [place],  [address]     Coagulation Studies: No results for input(s): LABPROT, INR in the last 72 hours.  Urinalysis: Recent Labs    04/13/21 2128  COLORURINE YELLOW  LABSPEC 1.010  PHURINE 5.0  GLUCOSEU NEGATIVE  HGBUR LARGE*  BILIRUBINUR NEGATIVE  KETONESUR NEGATIVE  PROTEINUR NEGATIVE  NITRITE NEGATIVE  LEUKOCYTESUR TRACE*      Imaging: US RENAL  Result Date: 04/14/2021 CLINICAL DATA:  Obstructive uropathy. EXAM: RENAL / URINARY TRACT ULTRASOUND COMPLETE COMPARISON:  CT scan 04/02/2021 FINDINGS: Right Kidney: Renal measurements: 10.4 x 6.0 x 5.7 cm =  volume: 185 mL. Multiple cysts identified measuring up to 3.4 cm. Scattered foci of echogenicity in the central sinus fat are indeterminate as no stones were visible on the CT scan from 2 weeks ago. Left Kidney: Renal measurements: 12.0 x 5.7 x 5.3 cm = volume: 189 mL. Echogenicity within normal limits. No mass or hydronephrosis visualized. Bladder: Decompressed by Foley catheter. Other: None. IMPRESSION: 1. No hydronephrosis. 2. Multiple right renal cysts. Electronically Signed   By: Misty Stanley M.D.   On: 04/14/2021 05:36   DG Chest Portable 1 View  Result Date: 04/13/2021 CLINICAL DATA:  Weakness, fall, prostate cancer EXAM:  PORTABLE CHEST 1 VIEW COMPARISON:  CT chest dated 04/02/2021 FINDINGS: Lungs are clear.  No pleural effusion or pneumothorax. The heart is normal in size. Thoracic aortic atherosclerosis. IMPRESSION: No evidence of acute cardiopulmonary disease. Electronically Signed   By: Julian Hy M.D.   On: 04/13/2021 21:31     Assessment & Plan: Mr. LAYKEN CALABRIA is a 87 y.o. white male with prostate cancer, hypertension, sleep apnea, who was admitted to [facility] on 04/13/2021 for Hyperkalemia [E87.5] Urinary retention [R33.9] Hyponatremia [E87.1] Elevated troponin [R77.8] Acute renal failure (ARF) ([place]) [N17.9] Elevated brain natriuretic peptide (BNP) level [R79.89] AKI (acute kidney injury) ([place]) [N17.9] Acute kidney injury ([place]) [N17.9]  Acute kidney injury: secondary to obstructive uropathy and bactrim. Now with nonoliguric urine output. Baseline creatinine of 0.93, normal GFR on 03/22/2021.   Hyperkalemia: secondary to AKI and bactrim. Do not think Bactrim is appropriate for this patient in the future.   Hyponatremia: start saline infusion  Metabolic acidosis: secondary to AKI  Anemia with renal failure: monitor hemoglobin  Hypotension: hold tamsulosin.    LOS: 1 Jordany Russett 9/12/20229:07 AM

## 2021-04-14 NOTE — ED Notes (Signed)
Patient transported to [facility] via stretcher on monitor by RN. Stable at transfer

## 2021-04-14 NOTE — TOC Initial Note (Signed)
Transition of Care [facility]) - Initial/Assessment Note    Patient Details  Name: Cesar Powell MRN: [id number] Date of Birth: 06/24/1931  [facility]) CM/SW Contact:    Kerin Salen, RN Phone Number: 04/14/2021, 12:58 PM  Clinical Narrative:  Spoke with patient, who is alert and oriented x4, son Cesar Powell at bedside. Patients lives with spouse, independent with ADL's, drives and able to attend medical appointments, shopping and cooking. Uses C-Pap at night sometimes. [facility]ses [facility] [place]. Denies use of [place] services, however if needed will consent. Denies use of assisted device for ambulation. No TOC barriers identified, will continue to track.                 Expected Discharge Plan: Home/Self Care Barriers to Discharge: Continued Medical Work up   Patient Goals and CMS Choice Patient states their goals for this hospitalization and ongoing recovery are:: To return home with wife.   Choice offered to / list presented to : NA  Expected Discharge Plan and Services Expected Discharge Plan: Home/Self Care       Living arrangements for the past 2 months: Single Family Home                                      Prior Living Arrangements/Services Living arrangements for the past 2 months: Single Family Home Lives with:: Spouse Patient language and need for interpreter reviewed:: Yes Do you feel safe going back to the place where you live?: Yes      Need for Family Participation in Patient Care: No (Comment) Care giver support system in place?: Yes (comment)   Criminal Activity/Legal Involvement Pertinent to Current Situation/Hospitalization: No - Comment as needed  Activities of Daily Living Home Assistive Devices/Equipment: CPAP, Cane (specify quad or straight) ADL Screening (condition at time of admission) Patient's cognitive ability adequate to safely complete daily activities?: No Is the patient deaf or have difficulty hearing?: No Does the patient have  difficulty seeing, even when wearing glasses/contacts?: No Does the patient have difficulty concentrating, remembering, or making decisions?: No Patient able to express need for assistance with ADLs?: Yes Does the patient have difficulty dressing or bathing?: No Independently performs ADLs?: Yes (appropriate for developmental age) Does the patient have difficulty walking or climbing stairs?: No Weakness of Legs: Both Weakness of Arms/Hands: Both  Permission Sought/Granted                  Emotional Assessment Appearance:: Appears stated age Attitude/Demeanor/Rapport: Engaged Affect (typically observed): Accepting, Appropriate Orientation: : Oriented to Self, Oriented to Place, Oriented to  Time, Oriented to Situation Alcohol / Substance Use: Not Applicable Psych Involvement: No (comment)  Admission diagnosis:  Hyperkalemia [E87.5] Urinary retention [R33.9] Hyponatremia [E87.1] Elevated troponin [R77.8] Acute renal failure (ARF) ([facility]) [N17.9] Elevated brain natriuretic peptide (BNP) level [R79.89] AKI (acute kidney injury) ([place]) [N17.9] Acute kidney injury ([place]) [N17.9] Patient Active Problem List   Diagnosis Date Noted   Acute renal failure (ARF) ([place]) 04/13/2021   Goals of care, counseling/discussion 08/08/2019   Prostate cancer ([place]) 08/08/2019   Subtrochanteric fracture of femur ([place]) 10/08/2014   Essential hypertension [id number]   METABOLIC SYNDROME X [id number]   Obstructive sleep apnea 06/22/2007   Local recurrence of prostate cancer ([place]) 06/22/2007   DIVERTICULITIS, HX OF 06/22/2007   PCP:  Owens Loffler, MD Pharmacy:   [id number] [place] [phone]  [place] [address] Phone: [phone] Fax: [phone]     Social Determinants of Health (SDOH) Interventions    Readmission Risk Interventions Readmission Risk Prevention Plan 04/14/2021  Transportation Screening Complete  PCP or Specialist Appt  within 5-7 Days Complete  Home Care Screening Complete  Medication Review (RN CM) Complete  Some recent data might be hidden

## 2021-04-15 DIAGNOSIS — R7989 Other specified abnormal findings of blood chemistry: Secondary | ICD-10-CM

## 2021-04-15 DIAGNOSIS — E875 Hyperkalemia: Secondary | ICD-10-CM

## 2021-04-15 DIAGNOSIS — E871 Hypo-osmolality and hyponatremia: Secondary | ICD-10-CM

## 2021-04-15 DIAGNOSIS — N179 Acute kidney failure, unspecified: Principal | ICD-10-CM

## 2021-04-15 DIAGNOSIS — R339 Retention of urine, unspecified: Secondary | ICD-10-CM

## 2021-04-15 DIAGNOSIS — R778 Other specified abnormalities of plasma proteins: Secondary | ICD-10-CM

## 2021-04-15 LAB — GLUCOSE, CAPILLARY
Glucose-Capillary: 124 mg/dL — ABNORMAL HIGH (ref 70–99)
Glucose-Capillary: 119 mg/dL — ABNORMAL HIGH (ref 70–99)
Glucose-Capillary: 105 mg/dL — ABNORMAL HIGH (ref 70–99)
Glucose-Capillary: 114 mg/dL — ABNORMAL HIGH (ref 70–99)

## 2021-04-15 LAB — BASIC METABOLIC PANEL
Anion gap: 8 (ref 5–15)
BUN: 28 mg/dL — ABNORMAL HIGH (ref 8–23)
CO2: 25 mmol/L (ref 22–32)
Calcium: 8 mg/dL — ABNORMAL LOW (ref 8.9–10.3)
Chloride: 99 mmol/L (ref 98–111)
Creatinine, Ser: 1.5 mg/dL — ABNORMAL HIGH (ref 0.61–1.24)
GFR, Estimated: 45 mL/min — ABNORMAL LOW (ref >60)
Glucose, Bld: 117 mg/dL — ABNORMAL HIGH (ref 70–99)
Potassium: 4.2 mmol/L (ref 3.5–5.1)
Sodium: 132 mmol/L — ABNORMAL LOW (ref 135–145)

## 2021-04-15 LAB — PHOSPHORUS: Phosphorus: 2.3 mg/dL — ABNORMAL LOW (ref 2.5–4.6)

## 2021-04-15 LAB — MAGNESIUM: Magnesium: 2.3 mg/dL (ref 1.7–2.4)

## 2021-04-15 MED ORDER — K PHOS MONO-SOD PHOS DI & MONO 155-852-130 MG PO TABS
500.0000 mg | ORAL_TABLET | ORAL | Status: AC
Start: 2021-04-15 — End: 2021-04-15
  Administered 2021-04-15 (×2): 500 mg via ORAL
  Filled 2021-04-15 (×2): qty 2

## 2021-04-15 NOTE — Consult Note (Signed)
PHARMACY CONSULT NOTE - FOLLOW UP  Pharmacy Consult for Electrolyte Monitoring and Replacement   Recent Labs: Potassium (mmol/L)  Date Value  04/15/2021 4.2  03/08/2012 5.8 (H)   Magnesium (mg/dL)  Date Value  04/15/2021 2.3   Calcium (mg/dL)  Date Value  04/15/2021 8.0 (L)   Calcium, Total (mg/dL)  Date Value  03/08/2012 9.0   Albumin (g/dL)  Date Value  04/14/2021 3.2 (L)  03/22/2021 3.7  03/08/2012 4.2   Phosphorus (mg/dL)  Date Value  04/15/2021 2.3 (L)   Sodium (mmol/L)  Date Value  04/15/2021 132 (L)  03/22/2021 137  03/08/2012 137     Assessment: 87yo Male w/ h/o metastatic prostate cancer (2005; s/p RAD tx), HTN, OSA presenting w/ AKI 2/2 obstructive uropathy c/b hyponatremia, hyperkalemia, & suspected UTI (s/p OP abx 9/1) with w/u for elevated troponins ISO ?demand ischemia. Pharmacy consulted for mgmt of electrolytes and renal adjustment of medications.  9/11: s/p foley for urinary retention; voided 6.9L since admin.  Scr: 9.85>8.12>6.33>1.50 Na: 122>124>128>129>132 K: 6.4>6.3>5.2>5.1>4.2 Cl: 90>93>94>97>99 Mg: 2.8>3.2>2.3 Phos: 6.2>2.3  Goal of Therapy:  [place] WNL  Plan:  -Will replete phos with 2 tabs q4h x2 doses -Will continue to monitor and replete electrolytes as needed -Daily BMP, Mg, and Phos  Narda Rutherford, PharmD Pharmacy Resident  04/15/2021 1:10 PM

## 2021-04-15 NOTE — Progress Notes (Signed)
[facility]  ROUNDING NOTE   Subjective:   Cesar Powell is a 87 y.o. male admitted to [facility] ICU with complains of abdominal pain, lightheadedness, and weakness. Patient had a fall. Patient had not been able to void for over 24 hours. He was recently treated with Bactrim for E. Coli urinary tract infection. Indwelling catheter placed. Immediate urine output.   Patient seen resting in bed Alert and oriented Tolerating meals, denies nausea and vomiting Denies shortness of breath   Objective:  Vital signs in last 24 hours:  Temp:  [98.2 F (36.8 C)-99.6 F (37.6 C)] 98.2 F (36.8 C) (09/13 0954) Pulse Rate:  [63-108] 64 (09/13 0954) Resp:  [13-18] 18 (09/13 0954) BP: (138-159)/(51-60) 138/51 (09/13 0954) SpO2:  [95 %-100 %] 96 % (09/13 0954) Weight:  [93.2 kg] 93.2 kg (09/12 1738)  Weight change: -2.041 kg Filed Weights   04/13/21 2020 04/14/21 0130 04/14/21 1738  Weight: 95.3 kg 95.5 kg 93.2 kg    Intake/Output: I/O last 3 completed shifts: In: 1206.3 [P.O.:600; I.V.:253; Other:250; IV Piggyback:103.3] Out: 8175 [Urine:8175]   Intake/Output this shift:  No intake/output data recorded.  Physical Exam: General: NAD,   Head: Normocephalic, atraumatic. Moist oral mucosal membranes  Eyes: Anicteric  Lungs:  Clear to auscultation, normal effort  Heart: Regular rate and rhythm  Abdomen:  Soft, nontender  Extremities:  no peripheral edema.  Neurologic: Nonfocal, moving all four extremities  Skin: No lesions  GU Foley in place    Basic Metabolic Panel: Recent Labs  Lab 04/13/21 2022 04/13/21 2315 04/14/21 0409 04/14/21 0904 04/15/21 0624  NA 122* 124* 128* 129* 132*  K 6.4* 6.3* 5.2* 5.1 4.2  CL 90* 93* 94* 97* 99  CO2 17* 17* 19* 20* 25  GLUCOSE 92 91 90 95 117*  BUN 98* 93* 93* 81* 28*  CREATININE 9.85* 9.11* 8.12* 6.33* 1.50*  CALCIUM 8.6* 8.3* 8.3* 8.4* 8.0*  MG  --   --  2.8*  --  2.3  PHOS  --   --  6.2*  --  2.3*    Liver Function  Tests: Recent Labs  Lab 04/13/21 2022 04/14/21 0409  AST 32  --   ALT 30  --   ALKPHOS 63  --   BILITOT 0.7  --   PROT 6.7  --   ALBUMIN 3.3* 3.2*   Recent Labs  Lab 04/13/21 2128  LIPASE 40   No results for input(s): AMMONIA in the last 168 hours.  CBC: Recent Labs  Lab 04/13/21 2006 04/13/21 2022 04/13/21 2315 04/14/21 0409  WBC 13.6* 13.6* 12.7* 11.6*  NEUTROABS 12.4*  --   --  10.4*  HGB 8.8* 8.9* 9.2* 8.9*  HCT 25.5* 25.4* 26.8* 25.3*  MCV 87.6 88.5 87.9 88.2  PLT 318 298 233 311    Cardiac Enzymes: No results for input(s): CKTOTAL, CKMB, CKMBINDEX, TROPONINI in the last 168 hours.  BNP: Invalid input(s): POCBNP  CBG: Recent Labs  Lab 04/14/21 0733 04/14/21 1120 04/14/21 1507 04/14/21 1749 04/15/21 0648  GLUCAP 87 109* 144* 138* 54*    Microbiology: Results for orders placed or performed during the hospital encounter of 04/13/21  Urine Culture     Status: Abnormal (Preliminary result)   Collection Time: 04/13/21  9:28 PM   Specimen: Urine, Catheterized  Result Value Ref Range Status   Specimen Description   Final    URINE, CATHETERIZED Performed at [facility], [address] [place], [place] [address]  Special Requests   Final    NONE Performed at [facility], [address] [place], [place] [address]    Culture (A)  Final    2,000 COLONIES/mL GRAM NEGATIVE RODS SUSCEPTIBILITIES TO FOLLOW Performed at [facility], [place] [address] [place], [place] [address]    Report Status PENDING  Incomplete  Resp Panel by RT-PCR (Flu A&B, Covid) Nasopharyngeal Swab     Status: None   Collection Time: 04/13/21 11:15 PM   Specimen: Nasopharyngeal Swab; Nasopharyngeal(NP) swabs in vial transport medium  Result Value Ref Range Status   SARS Coronavirus 2 by RT PCR NEGATIVE NEGATIVE Final    Comment: (NOTE) SARS-CoV-2 target nucleic acids are NOT DETECTED.  The SARS-CoV-2 RNA is generally detectable in upper  respiratory specimens during the acute phase of infection. The lowest concentration of SARS-CoV-2 viral copies this assay can detect is 138 copies/mL. A negative result does not preclude SARS-Cov-2 infection and should not be used as the sole basis for treatment or other patient management decisions. A negative result may occur with  improper specimen collection/handling, submission of specimen other than nasopharyngeal swab, presence of viral mutation(s) within the areas targeted by this assay, and inadequate number of viral copies(<138 copies/mL). A negative result must be combined with clinical observations, patient history, and epidemiological information. The expected result is Negative.  Fact Sheet for Patients:  EntrepreneurPulse.com.au  Fact Sheet for Healthcare Providers:  IncredibleEmployment.be  This test is no t yet approved or cleared by the [place] FDA and  has been authorized for detection and/or diagnosis of SARS-CoV-2 by FDA under an Emergency Use Authorization (EUA). This EUA will remain  in effect (meaning this test can be used) for the duration of the COVID-19 declaration under Section 564(b)(1) of the Act, 21 U.S.C.section 360bbb-3(b)(1), unless the authorization is terminated  or revoked sooner.       Influenza A by PCR NEGATIVE NEGATIVE Final   Influenza B by PCR NEGATIVE NEGATIVE Final    Comment: (NOTE) The Xpert Xpress SARS-CoV-2/FLU/RSV plus assay is intended as an aid in the diagnosis of influenza from Nasopharyngeal swab specimens and should not be used as a sole basis for treatment. Nasal washings and aspirates are unacceptable for Xpert Xpress SARS-CoV-2/FLU/RSV testing.  Fact Sheet for Patients: EntrepreneurPulse.com.au  Fact Sheet for Healthcare Providers: IncredibleEmployment.be  This test is not yet approved or cleared by the [place] FDA and has been  authorized for detection and/or diagnosis of SARS-CoV-2 by FDA under an Emergency Use Authorization (EUA). This EUA will remain in effect (meaning this test can be used) for the duration of the COVID-19 declaration under Section 564(b)(1) of the Act, 21 U.S.C. section 360bbb-3(b)(1), unless the authorization is terminated or revoked.  Performed at [facility], [address] [place], [place] [address]   MRSA Next Gen by PCR, Nasal     Status: None   Collection Time: 04/14/21  6:20 AM   Specimen: Nasal Mucosa; Nasal Swab  Result Value Ref Range Status   MRSA by PCR Next Gen NOT DETECTED NOT DETECTED Final    Comment: (NOTE) The GeneXpert MRSA Assay (FDA approved for NASAL specimens only), is one component of a comprehensive MRSA colonization surveillance program. It is not intended to diagnose MRSA infection nor to guide or monitor treatment for MRSA infections. Test performance is not FDA approved in patients less than 35 years old. Performed at [facility], [address] [place], [place] [address]  Coagulation Studies: No results for input(s): LABPROT, INR in the last 72 hours.  Urinalysis: Recent Labs    04/13/21 2128  COLORURINE YELLOW  LABSPEC 1.010  PHURINE 5.0  GLUCOSEU NEGATIVE  HGBUR LARGE*  BILIRUBINUR NEGATIVE  KETONESUR NEGATIVE  PROTEINUR NEGATIVE  NITRITE NEGATIVE  LEUKOCYTESUR TRACE*      Imaging: US RENAL  Result Date: 04/14/2021 CLINICAL DATA:  Obstructive uropathy. EXAM: RENAL / URINARY TRACT ULTRASOUND COMPLETE COMPARISON:  CT scan 04/02/2021 FINDINGS: Right Kidney: Renal measurements: 10.4 x 6.0 x 5.7 cm = volume: 185 mL. Multiple cysts identified measuring up to 3.4 cm. Scattered foci of echogenicity in the central sinus fat are indeterminate as no stones were visible on the CT scan from 2 weeks ago. Left Kidney: Renal measurements: 12.0 x 5.7 x 5.3 cm = volume: 189 mL. Echogenicity within normal limits. No mass or  hydronephrosis visualized. Bladder: Decompressed by Foley catheter. Other: None. IMPRESSION: 1. No hydronephrosis. 2. Multiple right renal cysts. Electronically Signed   By: Misty Stanley M.D.   On: 04/14/2021 05:36   DG Chest Portable 1 View  Result Date: 04/13/2021 CLINICAL DATA:  Weakness, fall, prostate cancer EXAM: PORTABLE CHEST 1 VIEW COMPARISON:  CT chest dated 04/02/2021 FINDINGS: Lungs are clear.  No pleural effusion or pneumothorax. The heart is normal in size. Thoracic aortic atherosclerosis. IMPRESSION: No evidence of acute cardiopulmonary disease. Electronically Signed   By: Julian Hy M.D.   On: 04/13/2021 21:31     Medications:    cefTRIAXone (ROCEPHIN)  IV 2 g (04/15/21 0057)   sodium bicarbonate 150 mEq in D5W infusion 50 mL/hr at 04/14/21 1729    ascorbic acid  1,000 mg Oral Daily   Chlorhexidine Gluconate Cloth  6 each Topical Daily   cholecalciferol  2,000 Units Oral Daily   heparin  5,000 Units Subcutaneous Q8H   multivitamin with minerals  1 tablet Oral Daily   phosphorus  500 mg Oral Q4H   docusate sodium, hydrALAZINE, ondansetron (ZOFRAN) IV, polyethylene glycol  Assessment/ Plan:  Mr. JOHSUA SCHLIEPER is a 87 y.o.  male With prostate cancer, hypertension, and sleep apnea was admitted to [facility] with Hyperkalemia [E87.5] Urinary retention [R33.9] Hyponatremia [E87.1] Elevated troponin [R77.8] Acute renal failure (ARF) (HCC) [N17.9] Elevated brain natriuretic peptide (BNP) level [R79.89] AKI (acute kidney injury) ([place]) [N17.9] Acute kidney injury ([place]) [N17.9]   Acute Kidney Injury with baseline creatinine 0.93 and GFR of >60 on 03/22/21.  Acute kidney injury secondary to obstructive uropathy and bactrim Creatinine has greatly improved.  Lab Results  Component Value Date   CREATININE 1.50 (H) 04/15/2021   CREATININE 6.33 (H) 04/14/2021   CREATININE 8.12 (H) 04/14/2021    Intake/Output Summary (Last 24 hours) at 04/15/2021 1203 Last data filed at  04/14/2021 1750 Gross per 24 hour  Intake 613.01 ml  Output 1125 ml  Net -511.99 ml   2. Hyperkalemia secondary to AKI and bactrim. May need to avoid this medication in this patient in the future. Improved to 4.2  3. Hyponatremia Improved to 132  4. Metabolic acidosis: secondary to AKI, improving  5. Anemia with renal failure: Will monitor Hgb  6 Hypotension: Tamsulosin held   LOS: 2 [place] 9/13/202212:03 PM

## 2021-04-15 NOTE — Progress Notes (Signed)
PROGRESS NOTE    Cesar Powell  [id number] DOB: [birth year] DOA: 04/13/2021 PCP: Owens Loffler, MD   Brief Narrative: Taken from prior notes. 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  He was recently seen by his urologist Dr. Diamantina [place] and diagnosed with UTI on 04/03/2021 and started on a 10-day course of Bactrim. Presented with lightheadedness and generalized weakness.  He was also experiencing lower abdominal pain and unable to void in the last 24 hours.  Found to have hyponatremia with sodium of 122, hyperkalemia with potassium of 6.4, bicarb of 17, BUN of 98 and creatinine of 9.85 with baseline around 1.  Elevated troponin with a flat curve and BNP at 481.  EKG with slightly peaked T waves. Urology was consulted for urinary retention and they were able to place a Foley catheter.  He was initially admitted in ICU and started on bicarb infusion by nephrology.  Nephrology was also consulted for significant AKI-renal function started improving after placing Foley catheter.  Most likely secondary to obstructive uropathy.  Per nephrology Bactrim might have exacerbated his renal failure and they were advising not to use it again.  Nephrology would like to continue IV fluid for another day.  Sodium seems improving, at 132 today. If continue to improve then most likely be discharged home tomorrow on 04/16/2021. He will be going with Foley catheter and follow-up with his urologist as an outpatient.  Subjective: Patient was seen and examined today.  Feeling improved, denies any chest pain or shortness of breath.  He was worried about his lab appointment tomorrow-going for PSA check.  Per urology that should not be checked for another couple of weeks due to recent manipulation and UTI.  Assessment & Plan:   Active Problems:   Acute  renal failure (ARF) (HCC)  AKI secondary to obstructive uropathy.  Creatinine improving, approaching close to his baseline around 1.  Nephrology is on board.  Bactrim can be contributory so he should not receive Bactrim anymore. Nephrology wants to continue with IV fluid for another day. -Monitor renal function -Avoid nephrotoxins -Continue Flomax  Hyperkalemia.  Secondary to AKI and now has been resolved. -Monitor potassium  Elevated troponin.  Peaked at 206 and started improving.  Most likely secondary to demand ischemia.  No concern of ACS at this time.  UTI.  Repeat urine culture with 2000 colonies of gram-negative rod.  Recently had positive urine cultures with E. coli, resistant to penicillin, otherwise good sensitivity.  Received a 10-day course of Bactrim.  Currently on ceftriaxone. -Continue ceftriaxone for another day-no need for continuation of antibiotics on discharge.  Hyponatremia.  Most likely secondary to AKI and urinary retention.  Improving, at 132 today.  Nephrology is following. -Nephrology wants to continue IV fluid for another day. -Continue to monitor sodium  Normocytic anemia.  No obvious bleeding. -Check anemia panel -Monitor hemoglobin -Transfuse if below 7   Objective: Vitals:   04/14/21 2340 04/15/21 0338 04/15/21 0954 04/15/21 1217  BP: (!) 151/60 (!) 148/58 (!) 138/51 138/60  Pulse: 65 67 64 (!) 57  Resp: '16 16 18 14  '$ Temp: 98.4 F (36.9 C) 98.6 F (37 C) 98.2 F (36.8 C) 98 F (36.7 C)  TempSrc: Oral  Oral   SpO2: 98% 97% 96% 97%  Weight:      Height:        Intake/Output Summary (Last 24  hours) at 04/15/2021 1359 Last data filed at 04/14/2021 1750 Gross per 24 hour  Intake 559.31 ml  Output 800 ml  Net -240.69 ml   Filed Weights   04/13/21 2020 04/14/21 0130 04/14/21 1738  Weight: 95.3 kg 95.5 kg 93.2 kg    Examination:  General exam: Appears calm and comfortable  Respiratory system: Clear to auscultation. Respiratory effort  normal. Cardiovascular system: S1 & S2 heard, RRR.  Gastrointestinal system: Soft, nontender, nondistended, bowel sounds positive. Central nervous system: Alert and oriented. No focal neurological deficits. Extremities: No edema, no cyanosis, pulses intact and symmetrical. Psychiatry: Judgement and insight appear normal. Mood & affect appropriate.    DVT prophylaxis: Heparin Code Status: Full Family Communication: Daughter was updated on phone. Disposition Plan:  Status is: Inpatient  Remains inpatient appropriate because:Inpatient level of care appropriate due to severity of illness  Dispo: The patient is from: Home              Anticipated d/c is to: Home              Patient currently is not medically stable to d/c.   Difficult to place patient No               Level of care: Med-Surg  All the records are reviewed and case discussed with Care Management/Social Worker. Management plans discussed with the patient, nursing and they are in agreement.  Consultants:  Nephrology. PCCM  Procedures:  Antimicrobials:  Ceftriaxone  Data Reviewed: I have personally reviewed following labs and imaging studies  CBC: Recent Labs  Lab 04/13/21 2006 04/13/21 2022 04/13/21 2315 04/14/21 0409  WBC 13.6* 13.6* 12.7* 11.6*  NEUTROABS 12.4*  --   --  10.4*  HGB 8.8* 8.9* 9.2* 8.9*  HCT 25.5* 25.4* 26.8* 25.3*  MCV 87.6 88.5 87.9 88.2  PLT 318 298 233 [id number]   Basic Metabolic Panel: Recent Labs  Lab 04/13/21 2022 04/13/21 2315 04/14/21 0409 04/14/21 0904 04/15/21 0624  NA 122* 124* 128* 129* 132*  K 6.4* 6.3* 5.2* 5.1 4.2  CL 90* 93* 94* 97* 99  CO2 17* 17* 19* 20* 25  GLUCOSE 92 91 90 95 117*  BUN 98* 93* 93* 81* 28*  CREATININE 9.85* 9.11* 8.12* 6.33* 1.50*  CALCIUM 8.6* 8.3* 8.3* 8.4* 8.0*  MG  --   --  2.8*  --  2.3  PHOS  --   --  6.2*  --  2.3*   GFR: Estimated Creatinine Clearance: 37 mL/min (A) (by C-G formula based on SCr of 1.5 mg/dL (H)). Liver Function  Tests: Recent Labs  Lab 04/13/21 2022 04/14/21 0409  AST 32  --   ALT 30  --   ALKPHOS 63  --   BILITOT 0.7  --   PROT 6.7  --   ALBUMIN 3.3* 3.2*   Recent Labs  Lab 04/13/21 2128  LIPASE 40   No results for input(s): AMMONIA in the last 168 hours. Coagulation Profile: No results for input(s): INR, PROTIME in the last 168 hours. Cardiac Enzymes: No results for input(s): CKTOTAL, CKMB, CKMBINDEX, TROPONINI in the last 168 hours. BNP (last 3 results) No results for input(s): PROBNP in the last 8760 hours. HbA1C: No results for input(s): HGBA1C in the last 72 hours. CBG: Recent Labs  Lab 04/14/21 1120 04/14/21 1507 04/14/21 1749 04/15/21 0648 04/15/21 1253  GLUCAP 109* 144* 138* 124* 119*   Lipid Profile: No results for input(s): CHOL, HDL, LDLCALC, TRIG, CHOLHDL, LDLDIRECT in  the last 72 hours. Thyroid Function Tests: No results for input(s): TSH, T4TOTAL, FREET4, T3FREE, THYROIDAB in the last 72 hours. Anemia Panel: No results for input(s): VITAMINB12, FOLATE, FERRITIN, TIBC, IRON, RETICCTPCT in the last 72 hours. Sepsis Labs: Recent Labs  Lab 04/13/21 2128  LATICACIDVEN 0.9    Recent Results (from the past 240 hour(s))  Urine Culture     Status: Abnormal (Preliminary result)   Collection Time: 04/13/21  9:28 PM   Specimen: Urine, Catheterized  Result Value Ref Range Status   Specimen Description   Final    URINE, CATHETERIZED Performed at [facility], [address] [place], [place] [address]    Special Requests   Final    NONE Performed at [facility], [address], [place], [place] [address]    Culture (A)  Final    2,000 COLONIES/mL GRAM NEGATIVE RODS SUSCEPTIBILITIES TO FOLLOW Performed at [facility], [place] [address], [place], [place] [address]    Report Status PENDING  Incomplete  Resp Panel by RT-PCR (Flu A&B, Covid) Nasopharyngeal Swab     Status: None   Collection Time: 04/13/21 11:15 PM   Specimen:  Nasopharyngeal Swab; Nasopharyngeal(NP) swabs in vial transport medium  Result Value Ref Range Status   SARS Coronavirus 2 by RT PCR NEGATIVE NEGATIVE Final    Comment: (NOTE) SARS-CoV-2 target nucleic acids are NOT DETECTED.  The SARS-CoV-2 RNA is generally detectable in upper respiratory specimens during the acute phase of infection. The lowest concentration of SARS-CoV-2 viral copies this assay can detect is 138 copies/mL. A negative result does not preclude SARS-Cov-2 infection and should not be used as the sole basis for treatment or other patient management decisions. A negative result may occur with  improper specimen collection/handling, submission of specimen other than nasopharyngeal swab, presence of viral mutation(s) within the areas targeted by this assay, and inadequate number of viral copies(<138 copies/mL). A negative result must be combined with clinical observations, patient history, and epidemiological information. The expected result is Negative.  Fact Sheet for Patients:  EntrepreneurPulse.com.au  Fact Sheet for Healthcare Providers:  IncredibleEmployment.be  This test is no t yet approved or cleared by the [place] FDA and  has been authorized for detection and/or diagnosis of SARS-CoV-2 by FDA under an Emergency Use Authorization (EUA). This EUA will remain  in effect (meaning this test can be used) for the duration of the COVID-19 declaration under Section 564(b)(1) of the Act, 21 U.S.C.section 360bbb-3(b)(1), unless the authorization is terminated  or revoked sooner.       Influenza A by PCR NEGATIVE NEGATIVE Final   Influenza B by PCR NEGATIVE NEGATIVE Final    Comment: (NOTE) The Xpert Xpress SARS-CoV-2/FLU/RSV plus assay is intended as an aid in the diagnosis of influenza from Nasopharyngeal swab specimens and should not be used as a sole basis for treatment. Nasal washings and aspirates are unacceptable for  Xpert Xpress SARS-CoV-2/FLU/RSV testing.  Fact Sheet for Patients: EntrepreneurPulse.com.au  Fact Sheet for Healthcare Providers: IncredibleEmployment.be  This test is not yet approved or cleared by the [place] FDA and has been authorized for detection and/or diagnosis of SARS-CoV-2 by FDA under an Emergency Use Authorization (EUA). This EUA will remain in effect (meaning this test can be used) for the duration of the COVID-19 declaration under Section 564(b)(1) of the Act, 21 U.S.C. section 360bbb-3(b)(1), unless the authorization is terminated or revoked.  Performed at [facility], [address], [place], [place] [address]  MRSA Next Gen by PCR, Nasal     Status: None   Collection Time: 04/14/21  6:20 AM   Specimen: Nasal Mucosa; Nasal Swab  Result Value Ref Range Status   MRSA by PCR Next Gen NOT DETECTED NOT DETECTED Final    Comment: (NOTE) The GeneXpert MRSA Assay (FDA approved for NASAL specimens only), is one component of a comprehensive MRSA colonization surveillance program. It is not intended to diagnose MRSA infection nor to guide or monitor treatment for MRSA infections. Test performance is not FDA approved in patients less than 62 years old. Performed at [facility], [address] [place], [place] [address]      Radiology Studies: US RENAL  Result Date: 04/14/2021 CLINICAL DATA:  Obstructive uropathy. EXAM: RENAL / URINARY TRACT ULTRASOUND COMPLETE COMPARISON:  CT scan 04/02/2021 FINDINGS: Right Kidney: Renal measurements: 10.4 x 6.0 x 5.7 cm = volume: 185 mL. Multiple cysts identified measuring up to 3.4 cm. Scattered foci of echogenicity in the central sinus fat are indeterminate as no stones were visible on the CT scan from 2 weeks ago. Left Kidney: Renal measurements: 12.0 x 5.7 x 5.3 cm = volume: 189 mL. Echogenicity within normal limits. No mass or hydronephrosis visualized. Bladder:  Decompressed by Foley catheter. Other: None. IMPRESSION: 1. No hydronephrosis. 2. Multiple right renal cysts. Electronically Signed   By: Misty Stanley M.D.   On: 04/14/2021 05:36   DG Chest Portable 1 View  Result Date: 04/13/2021 CLINICAL DATA:  Weakness, fall, prostate cancer EXAM: PORTABLE CHEST 1 VIEW COMPARISON:  CT chest dated 04/02/2021 FINDINGS: Lungs are clear.  No pleural effusion or pneumothorax. The heart is normal in size. Thoracic aortic atherosclerosis. IMPRESSION: No evidence of acute cardiopulmonary disease. Electronically Signed   By: Julian Hy M.D.   On: 04/13/2021 21:31    Scheduled Meds:  ascorbic acid  1,000 mg Oral Daily   Chlorhexidine Gluconate Cloth  6 each Topical Daily   cholecalciferol  2,000 Units Oral Daily   heparin  5,000 Units Subcutaneous Q8H   multivitamin with minerals  1 tablet Oral Daily   phosphorus  500 mg Oral Q4H   Continuous Infusions:  cefTRIAXone (ROCEPHIN)  IV 2 g (04/15/21 0057)   sodium bicarbonate 150 mEq in D5W infusion 50 mL/hr at 04/14/21 1729     LOS: 2 days   Time spent: 40 minutes. More than 50% of the time was spent in counseling/coordination of care  Lorella Nimrod, MD [facility]  If 7PM-7AM, please contact night-coverage [email or web address]  04/15/2021, 1:59 PM   This record has been created using Systems analyst. Errors have been sought and corrected,but may not always be located. Such creation errors do not reflect on the standard of care.

## 2021-04-16 LAB — CBC
HCT: 25.9 % — ABNORMAL LOW (ref 39.0–52.0)
Hemoglobin: 8.7 g/dL — ABNORMAL LOW (ref 13.0–17.0)
MCH: 30.5 pg (ref 26.0–34.0)
MCHC: 33.6 g/dL (ref 30.0–36.0)
MCV: 90.9 fL (ref 80.0–100.0)
Platelets: 277 10*3/uL (ref 150–400)
RBC: 2.85 MIL/uL — ABNORMAL LOW (ref 4.22–5.81)
RDW: 14.2 % (ref 11.5–15.5)
WBC: 8.5 10*3/uL (ref 4.0–10.5)
nRBC: 0 % (ref 0.0–0.2)

## 2021-04-16 LAB — BASIC METABOLIC PANEL
Anion gap: 7 (ref 5–15)
BUN: 15 mg/dL (ref 8–23)
CO2: 27 mmol/L (ref 22–32)
Calcium: 8 mg/dL — ABNORMAL LOW (ref 8.9–10.3)
Chloride: 98 mmol/L (ref 98–111)
Creatinine, Ser: 0.81 mg/dL (ref 0.61–1.24)
GFR, Estimated: >60 mL/min (ref >60)
Glucose, Bld: 113 mg/dL — ABNORMAL HIGH (ref 70–99)
Potassium: 4.2 mmol/L (ref 3.5–5.1)
Sodium: 132 mmol/L — ABNORMAL LOW (ref 135–145)

## 2021-04-16 LAB — MAGNESIUM: Magnesium: 2.1 mg/dL (ref 1.7–2.4)

## 2021-04-16 LAB — PHOSPHORUS: Phosphorus: 2.1 mg/dL — ABNORMAL LOW (ref 2.5–4.6)

## 2021-04-16 MED ORDER — TAMSULOSIN HCL 0.4 MG PO CAPS: 0.4000 mg | ORAL_CAPSULE | Freq: Every day | ORAL | 3 refills | Status: DC

## 2021-04-16 MED ORDER — K PHOS MONO-SOD PHOS DI & MONO 155-852-130 MG PO TABS
500.0000 mg | ORAL_TABLET | ORAL | Status: DC
Start: 2021-04-16 — End: 2021-04-16
  Administered 2021-04-16: 500 mg via ORAL
  Filled 2021-04-16 (×4): qty 2

## 2021-04-16 NOTE — Discharge Summary (Signed)
[facility]  Physician Discharge Summary   Patient ID: [name] MRN: [id number] DOB/AGE: [birth year] 87 y.o.  Admit date: 04/13/2021 Discharge date: 04/16/2021    PCP: Owens Loffler, MD  DISCHARGE DIAGNOSES:  Acute kidney injury secondary to obstructive uropathy Acute urinary retention status post Foley catheter placement Recent urinary tract infection Normocytic anemia  RECOMMENDATIONS FOR OUTPATIENT FOLLOW UP: Outpatient follow-up with urology in 1 week.  Foley catheter is to stay in place till then.  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  He was recently seen by his urologist Dr. Diamantina Providence and diagnosed with UTI on 04/03/2021 and started on a 10-day course of Bactrim. Presented with lightheadedness and generalized weakness.  He was also experiencing lower abdominal pain and unable to void in the last 24 hours.   Found to have hyponatremia with sodium of 122, hyperkalemia with potassium of 6.4, bicarb of 17, BUN of 98 and creatinine of 9.85 with baseline around 1.  Elevated troponin with a flat curve and BNP at 481.  EKG with slightly peaked T waves. Urology was consulted for urinary retention and they were able to place a Foley catheter.  He was initially admitted in ICU and started on bicarb infusion by nephrology.  Nephrology was also consulted for significant AKI-renal function started improving after placing Foley catheter.  Most likely secondary to obstructive uropathy.  Per nephrology Bactrim might have exacerbated his renal failure and they were advising not to use it again.   Nephrology would like to continue IV fluid for another day.  Sodium seems  improving, at 132 today. If continue to improve then most likely be discharged home tomorrow on 04/16/2021. He will be going with Foley catheter and follow-up with his urologist as an outpatient.   Consultations: Urology Nephrology  Procedures: Foley catheter placement   HOSPITAL COURSE:     AKI secondary to obstructive uropathy/acute urinary retention Patient presented with acute urinary retention.  Had elevated creatinine along with hyperkalemia.  He was also on Bactrim recently for UTI.  Patient was seen by urology and a Foley catheter was placed emergently.  Seen by nephrology.  Was given IV fluids.  Renal function is now back to normal.  No significant postobstructive diuresis noted.  Electrolytes are back to baseline.  Flomax to be continued.  Patient did mention dizziness whenever he takes Flomax.  We will decrease the dose and ask him to take it at nighttime.    Hyperkalemia.  Secondary to AKI and now has been resolved.  Elevated troponin.  Peaked at 206 and started improving.  Most likely secondary to demand ischemia.  No concern of ACS at this time.  Did not have any anginal symptoms.   UTI.  Repeat urine culture with 2000 colonies of gram-negative rod.  Recently had positive urine cultures with E. coli, resistant to penicillin, otherwise good sensitivity.  Received a 10-day course of Bactrim.  Was given ceftriaxone in the hospital but was not continued at discharge since he has been adequately treated for same.   Hyponatremia.  Most likely secondary to AKI and urinary retention.  Stable   Normocytic anemia.   Stable  Obesity Estimated body mass index is 32.77 kg/m as calculated from the following:   Height as  of this encounter: '5\' 7"'$  (1.702 m).   Weight as of this encounter: 94.9 kg.  Patient is stable.  Okay for discharge home today.  Discussed with nephrology.   PERTINENT LABS:  The results of significant diagnostics from this hospitalization (including imaging,  microbiology, ancillary and laboratory) are listed below for reference.    Microbiology: Recent Results (from the past 240 hour(s))  Urine Culture     Status: Abnormal   Collection Time: 04/13/21  9:28 PM   Specimen: Urine, Catheterized  Result Value Ref Range Status   Specimen Description   Final    URINE, CATHETERIZED Performed at [facility], [address] [place], [place] [address]    Special Requests   Final    NONE Performed at [facility], [place], [place] [address]    Culture 2,000 COLONIES/mL ESCHERICHIA COLI (A)  Final   Report Status 04/17/2021 FINAL  Final   Organism ID, Bacteria ESCHERICHIA COLI (A)  Final      Susceptibility   Escherichia coli - MIC*    AMPICILLIN >=32 RESISTANT Resistant     CEFAZOLIN 8 SENSITIVE Sensitive     CEFEPIME <=0.12 SENSITIVE Sensitive     CEFTRIAXONE 0.5 SENSITIVE Sensitive     CIPROFLOXACIN <=0.25 SENSITIVE Sensitive     GENTAMICIN <=1 SENSITIVE Sensitive     IMIPENEM <=0.25 SENSITIVE Sensitive     NITROFURANTOIN <=16 SENSITIVE Sensitive     TRIMETH/SULFA <=20 SENSITIVE Sensitive     AMPICILLIN/SULBACTAM 16 INTERMEDIATE Intermediate     PIP/TAZO <=4 SENSITIVE Sensitive     * 2,000 COLONIES/mL ESCHERICHIA COLI  Resp Panel by RT-PCR (Flu A&B, Covid) Nasopharyngeal Swab     Status: None   Collection Time: 04/13/21 11:15 PM   Specimen: Nasopharyngeal Swab; Nasopharyngeal(NP) swabs in vial transport medium  Result Value Ref Range Status   SARS Coronavirus 2 by RT PCR NEGATIVE NEGATIVE Final    Comment: (NOTE) SARS-CoV-2 target nucleic acids are NOT DETECTED.  The SARS-CoV-2 RNA is generally detectable in upper respiratory specimens during the acute phase of infection. The lowest concentration of SARS-CoV-2 viral copies this assay can detect is 138 copies/mL. A negative result does not preclude SARS-Cov-2 infection and should not be used as the sole basis for treatment or other patient  management decisions. A negative result may occur with  improper specimen collection/handling, submission of specimen other than nasopharyngeal swab, presence of viral mutation(s) within the areas targeted by this assay, and inadequate number of viral copies(<138 copies/mL). A negative result must be combined with clinical observations, patient history, and epidemiological information. The expected result is Negative.  Fact Sheet for Patients:  EntrepreneurPulse.com.au  Fact Sheet for Healthcare Providers:  IncredibleEmployment.be  This test is no t yet approved or cleared by the [place] FDA and  has been authorized for detection and/or diagnosis of SARS-CoV-2 by FDA under an Emergency Use Authorization (EUA). This EUA will remain  in effect (meaning this test can be used) for the duration of the COVID-19 declaration under Section 564(b)(1) of the Act, 21 U.S.C.section 360bbb-3(b)(1), unless the authorization is terminated  or revoked sooner.       Influenza A by PCR NEGATIVE NEGATIVE Final   Influenza B by PCR NEGATIVE NEGATIVE Final    Comment: (NOTE) The Xpert Xpress SARS-CoV-2/FLU/RSV plus assay is intended as an aid in the diagnosis of influenza from Nasopharyngeal swab specimens and should not be used as a sole basis for treatment. Nasal washings and  aspirates are unacceptable for Xpert Xpress SARS-CoV-2/FLU/RSV testing.  Fact Sheet for Patients: EntrepreneurPulse.com.au  Fact Sheet for Healthcare Providers: IncredibleEmployment.be  This test is not yet approved or cleared by the [place] FDA and has been authorized for detection and/or diagnosis of SARS-CoV-2 by FDA under an Emergency Use Authorization (EUA). This EUA will remain in effect (meaning this test can be used) for the duration of the COVID-19 declaration under Section 564(b)(1) of the Act, 21 U.S.C. section 360bbb-3(b)(1),  unless the authorization is terminated or revoked.  Performed at [facility], [address] [place], [place] [address]   MRSA Next Gen by PCR, Nasal     Status: None   Collection Time: 04/14/21  6:20 AM   Specimen: Nasal Mucosa; Nasal Swab  Result Value Ref Range Status   MRSA by PCR Next Gen NOT DETECTED NOT DETECTED Final    Comment: (NOTE) The GeneXpert MRSA Assay (FDA approved for NASAL specimens only), is one component of a comprehensive MRSA colonization surveillance program. It is not intended to diagnose MRSA infection nor to guide or monitor treatment for MRSA infections. Test performance is not FDA approved in patients less than 60 years old. Performed at [facility], [address] [place], [place] [address]      Labs:  COVID-19 Labs   Lab Results  Component Value Date   [place] 04/13/2021   [place] Not Detected 03/10/2021   [place] NEGATIVE 10/26/2019   [place] NEGATIVE 10/02/2019      Basic Metabolic Panel: Recent Labs  Lab 04/13/21 2315 04/14/21 0409 04/14/21 0904 04/15/21 0624 04/16/21 0821  NA 124* 128* 129* 132* 132*  K 6.3* 5.2* 5.1 4.2 4.2  CL 93* 94* 97* 99 98  CO2 17* 19* 20* 25 27  GLUCOSE 91 90 95 117* 113*  BUN 93* 93* 81* 28* 15  CREATININE 9.11* 8.12* 6.33* 1.50* 0.81  CALCIUM 8.3* 8.3* 8.4* 8.0* 8.0*  MG  --  2.8*  --  2.3 2.1  PHOS  --  6.2*  --  2.3* 2.1*   Liver Function Tests: Recent Labs  Lab 04/13/21 2022 04/14/21 0409  AST 32  --   ALT 30  --   ALKPHOS 63  --   BILITOT 0.7  --   PROT 6.7  --   ALBUMIN 3.3* 3.2*   Recent Labs  Lab 04/13/21 2128  LIPASE 40    CBC: Recent Labs  Lab 04/13/21 2006 04/13/21 2022 04/13/21 2315 04/14/21 0409 04/16/21 0830  WBC 13.6* 13.6* 12.7* 11.6* 8.5  NEUTROABS 12.4*  --   --  10.4*  --   HGB 8.8* 8.9* 9.2* 8.9* 8.7*  HCT 25.5* 25.4* 26.8* 25.3* 25.9*  MCV 87.6 88.5 87.9 88.2 90.9  PLT 318 298 233 311 277     BNP: BNP (last 3 results) Recent Labs    04/13/21 2128  BNP 481.6*     CBG: Recent Labs  Lab 04/14/21 1749 04/15/21 0648 04/15/21 1253 04/15/21 1625 04/15/21 2115  GLUCAP 138* 124* 119* 105* 114*     IMAGING STUDIES NM Bone Scan Whole Body  Result Date: 04/08/2021 CLINICAL DATA:  History of prostate cancer. Lower left back pain for 1 week. No known injury. Rising serum PSA levels. EXAM: NUCLEAR MEDICINE WHOLE BODY BONE SCAN TECHNIQUE: Whole body anterior and posterior images were obtained approximately 3 hours after intravenous injection of radiopharmaceutical. RADIOPHARMACEUTICALS:  21.61 mCi Technetium-76mMDP IV COMPARISON:  Whole-body bone scan 07/08/2020. CTs of the  chest, abdomen and pelvis 04/02/2021 and lumbar MRI 08/31/2020. FINDINGS: There is prominent bladder activity which partially obscures the lower lumbar spine and pelvis. Some urine contamination is noted in the perineal region. The previously demonstrated prominent uptake in the lower lumbar spine has improved, likely due to degenerative disc disease at L3-4 on previous lumbar MRI. There is new mildly increased uptake at T8. In correlation with the recent chest CT, there is mild sclerosis within the T8 vertebral body which appears new compared with previous CT from 04/06/2018. No other suspicious osseous uptake is identified. There is mild degenerative uptake in the shoulders, knees and feet. The soft tissue activity is otherwise unremarkable. IMPRESSION: 1. New mildly increased uptake in the T8 vertebral body, corresponding with mild sclerosis on recent CT, suspicious for metastatic disease. 2. The previously demonstrated uptake in the lower lumbar spine has improved, corresponding with degenerative disc disease on previous MRI. No other evidence of metastatic disease. 3. Distended bladder. Electronically Signed   By: Richardean Sale M.D.   On: 04/08/2021 13:14   US RENAL  Result Date: 04/14/2021 CLINICAL DATA:   Obstructive uropathy. EXAM: RENAL / URINARY TRACT ULTRASOUND COMPLETE COMPARISON:  CT scan 04/02/2021 FINDINGS: Right Kidney: Renal measurements: 10.4 x 6.0 x 5.7 cm = volume: 185 mL. Multiple cysts identified measuring up to 3.4 cm. Scattered foci of echogenicity in the central sinus fat are indeterminate as no stones were visible on the CT scan from 2 weeks ago. Left Kidney: Renal measurements: 12.0 x 5.7 x 5.3 cm = volume: 189 mL. Echogenicity within normal limits. No mass or hydronephrosis visualized. Bladder: Decompressed by Foley catheter. Other: None. IMPRESSION: 1. No hydronephrosis. 2. Multiple right renal cysts. Electronically Signed   By: Misty Stanley M.D.   On: 04/14/2021 05:36   CT CHEST ABDOMEN PELVIS W CONTRAST  Result Date: 04/02/2021 CLINICAL DATA:  Follow-up prostate cancer EXAM: CT CHEST, ABDOMEN, AND PELVIS WITH CONTRAST TECHNIQUE: Multidetector CT imaging of the chest, abdomen and pelvis was performed following the standard protocol during bolus administration of intravenous contrast. CONTRAST:  124m OMNIPAQUE IOHEXOL 350 MG/ML SOLN COMPARISON:  PET-CT, 08/01/2019 FINDINGS: CT CHEST FINDINGS Cardiovascular: Aortic atherosclerosis. Normal heart size. Three-vessel coronary artery calcifications. No pericardial effusion. Mediastinum/Nodes: Newly enlarged periaortic lymph nodes about the descending thoracic aorta, measuring up to 1.5 x 0.9 cm (series 2, image 50). No other enlarged mediastinal, hilar, or axillary lymph nodes. Thyroid gland, trachea, and esophagus demonstrate no significant findings. Lungs/Pleura: Lungs are clear. No pleural effusion or pneumothorax. Musculoskeletal: No chest wall mass or suspicious bone lesions identified. CT ABDOMEN PELVIS FINDINGS Hepatobiliary: No solid liver abnormality is seen. No gallstones, gallbladder wall thickening, or biliary dilatation. Pancreas: Unremarkable. No pancreatic ductal dilatation or surrounding inflammatory changes. Spleen: Normal in  size without significant abnormality. Adrenals/Urinary Tract: Adrenal glands are unremarkable. Kidneys are normal, without renal calculi, solid lesion, or hydronephrosis. Mildly thickened urinary bladder wall. Stomach/Bowel: Stomach is within normal limits. Appendix appears normal. No evidence of bowel wall thickening, distention, or inflammatory changes. Sigmoid diverticulosis. Vascular/Lymphatic: No significant vascular findings are present. Previously noted enlarged, radiotracer avid retroperitoneal and bilateral iliac lymph nodes are resolved, however there are newly enlarged lymph nodes in the more superior retroperitoneum at the level of the renal arteries (series 2, image 74). These nodes measure up to 1.8 x 1.2 cm. Reproductive: Mild prostatomegaly.  Prostate brachytherapy pellets. Other: No abdominal wall hernia or abnormality. No abdominopelvic ascites. Musculoskeletal: No acute or significant osseous findings. IMPRESSION: 1. Previously  noted enlarged, radiotracer avid retroperitoneal and bilateral iliac lymph nodes are resolved, however there are newly enlarged lymph nodes in the more superior retroperitoneum at the level of the renal arteries as well as about the descending thoracic aorta. Findings are concerning for recurrent nodal metastatic disease with a mixed response to treatment in the interval to prior examination dated 08/01/2019. 2. Mild prostatomegaly with brachytherapy pellets. 3. Mildly thickened urinary bladder wall, likely due to chronic outlet obstruction. 4. Coronary artery disease. Aortic Atherosclerosis (ICD10-I70.0). Electronically Signed   By: Eddie Candle M.D.   On: 04/02/2021 16:01   DG Chest Portable 1 View  Result Date: 04/13/2021 CLINICAL DATA:  Weakness, fall, prostate cancer EXAM: PORTABLE CHEST 1 VIEW COMPARISON:  CT chest dated 04/02/2021 FINDINGS: Lungs are clear.  No pleural effusion or pneumothorax. The heart is normal in size. Thoracic aortic atherosclerosis.  IMPRESSION: No evidence of acute cardiopulmonary disease. Electronically Signed   By: Julian Hy M.D.   On: 04/13/2021 21:31    DISCHARGE EXAMINATION: Vitals:   04/15/21 2335 04/16/21 0646 04/16/21 0732 04/16/21 1145  BP: (!) 137/51  (!) 128/51 (!) 133/45  Pulse: 61  (!) 59 60  Resp: '16  16 18  '$ Temp: 97.9 F (36.6 C)  98.9 F (37.2 C) 97.7 F (36.5 C)  TempSrc:      SpO2: 96%  99% 98%  Weight:  94.9 kg    Height:       General appearance: Awake alert.  In no distress Resp: Clear to auscultation bilaterally.  Normal effort Cardio: S1-S2 is normal regular.  No S3-S4.  No rubs murmurs or bruit GI: Abdomen is soft.  Nontender nondistended.  Bowel sounds are present normal.  No masses organomegaly    DISPOSITION: Home  Discharge Instructions     Call MD for:  difficulty breathing, headache or visual disturbances   Complete by: As directed    Call MD for:  extreme fatigue   Complete by: As directed    Call MD for:  persistant dizziness or light-headedness   Complete by: As directed    Call MD for:  persistant nausea and vomiting   Complete by: As directed    Call MD for:  severe uncontrolled pain   Complete by: As directed    Call MD for:  temperature >100.4   Complete by: As directed    Diet - low sodium heart healthy   Complete by: As directed    Discharge instructions   Complete by: As directed    Please stay well-hydrated.  Take your medications as prescribed.  Avoid NSAIDs such as Motrin, Aleve.  Please be sure to follow-up with the urologist in 1 week.  They will attempt to remove the catheter at that visit.  You were cared for by a hospitalist during your hospital stay. If you have any questions about your discharge medications or the care you received while you were in the hospital after you are discharged, you can call the unit and asked to speak with the hospitalist on call if the hospitalist that took care of you is not available. Once you are discharged,  your primary care physician will handle any further medical issues. Please note that NO REFILLS for any discharge medications will be authorized once you are discharged, as it is imperative that you return to your primary care physician (or establish a relationship with a primary care physician if you do not have one) for your aftercare needs so that they can  reassess your need for medications and monitor your lab values. If you do not have a primary care physician, you can call [phone] for a physician referral.   Increase activity slowly   Complete by: As directed           Allergies as of 04/16/2021       Reactions   Gluten Meal Rash        Medication List     STOP taking these medications    sulfamethoxazole-trimethoprim 800-160 MG tablet Commonly known as: BACTRIM DS       TAKE these medications    ascorbic acid 500 MG tablet Commonly known as: VITAMIN C Take 1,000 mg by mouth daily.   Immune Enhance Tabs Take 1 tablet by mouth daily.   JOINT HEALTH PO Take 2 tablets by mouth daily.   IMMUNE FORMULA PO Take 2 capsules by mouth daily. Nutriferon by Shaklee   multivitamin with minerals Tabs tablet Take 2 tablets by mouth daily. Vita-Lea   PROBIOTIC PO Take 1 capsule by mouth daily.   RESVERATROL PO Take 2 capsules by mouth daily. Vivix   tamsulosin 0.4 MG Caps capsule Commonly known as: FLOMAX Take 1 capsule (0.4 mg total) by mouth daily after supper. What changed:  how much to take when to take this   TURMERIC PO Take 1 capsule by mouth daily.   Vitamin D3 50 MCG (2000 UT) Tabs Take 2,000 Units by mouth daily.   ZINC PO Take 1 tablet by mouth daily.          Follow-up Information     Copland, Spencer, MD. Schedule an appointment as soon as possible for a visit in 1 week(s).   Specialties: Family Medicine, Sports Medicine Why: Patient will need to make a follow up appointment. Contact information: [place]  [address] [phone]         Billey Co, MD. Schedule an appointment as soon as possible for a visit on 04/22/2021.   Specialty: Urology Why: @ 9am and 1:30pm Contact information: [place] [address] [phone]                 TOTAL DISCHARGE TIME: 58 minutes  Arnold  [facility] Pager on [email or web address]  04/17/2021, 1:07 PM

## 2021-04-16 NOTE — Consult Note (Signed)
PHARMACY CONSULT NOTE - FOLLOW UP  Pharmacy Consult for Electrolyte Monitoring and Replacement   Recent Labs: Potassium (mmol/L)  Date Value  04/15/2021 4.2  03/08/2012 5.8 (H)   Magnesium (mg/dL)  Date Value  04/15/2021 2.3   Calcium (mg/dL)  Date Value  04/15/2021 8.0 (L)   Calcium, Total (mg/dL)  Date Value  03/08/2012 9.0   Albumin (g/dL)  Date Value  04/14/2021 3.2 (L)  03/22/2021 3.7  03/08/2012 4.2   Phosphorus (mg/dL)  Date Value  04/15/2021 2.3 (L)   Sodium (mmol/L)  Date Value  04/15/2021 132 (L)  03/22/2021 137  03/08/2012 137     Assessment: 87yo Male w/ h/o metastatic prostate cancer (2005; s/p RAD tx), HTN, OSA presenting w/ AKI 2/2 obstructive uropathy c/b hyponatremia, hyperkalemia, & suspected UTI (s/p OP abx 9/1) with w/u for elevated troponins ISO ?demand ischemia. Pharmacy consulted for mgmt of electrolytes and renal adjustment of medications.  9/11: s/p foley for urinary retention; voided 6.9L since admin.  Sodium bicarb infusion discontinued 9/14   Scr: 9.85>8.12>6.33>1.50>0.81 Na: 122>124>128>129>132>132 K: 6.4>6.3>5.2>5.1>4.2>4.2 Cl: 90>93>94>97>99>98 Mg: 2.8>3.2>2.3>2.1 Phos: 6.2>2.3>2.1  Goal of Therapy:  Lytes WNL  Plan:  -Will replete phos with 2 tabs q4h x4 doses -Pt planned to be discharged, will not order additional labs -Pharmacy will sign off  Narda Rutherford, PharmD Pharmacy Resident  04/16/2021 7:18 AM

## 2021-04-16 NOTE — Progress Notes (Signed)
Physical Therapy Evaluation Patient Details Name: Cesar Powell MRN: [id number] DOB: Jul 07, 1931 Today's Date: 04/16/2021  History of Present Illness  This is an 87 yo male who presented to [facility] ER on 09/11 with c/o several day hx of lightheadedness and weakness. Due to lightheadedness he did fall, but did not hit his head or injure himself. He has a hx of metastatic castrate resistant prostate cancer initially diagnosed in 2005 and underwent radiation therapy. Pt admitted to ICU with acute kidney injury with hyperkalemia secondary to urinary retention and UTI   Clinical Impression  Pt is a pleasant 87 year old male who presents to PT evaluation after fall and acute kidney injury secondary to urinary retention and UTI. Pt reports being independent with all ADLs prior to admission and no usage of assitive devices for mobility. Pt currently able to perform transfers with RW at mod I and ambulate >150 ft with SBA/SPV with RW. Pt presents with 5/5 B LE strength and sensation WNL with exception to decreased great toe light touch. Recommending home with spouse who is available 24/7 and usage of rollator at discharge for safety and reduce likelihood of falls. Pt is at/near baseline function and PT will not need further PT services. Will sign off on orders.   Recommendations for follow up therapy are one component of a multi-disciplinary discharge planning process, led by the attending physician.  Recommendations may be updated based on patient status, additional functional criteria and insurance authorization.  Follow Up Recommendations No PT follow up    Equipment Recommendations  None recommended by PT    Recommendations for Other Services       Precautions / Restrictions Precautions Precautions: Fall Restrictions Weight Bearing Restrictions: No      Mobility  Bed Mobility       General bed mobility comments: Pt received sitting in recliner chair    Transfers Overall transfer level:  Needs assistance Equipment used: Rolling walker (2 wheeled) Transfers: Sit to/from Stand Sit to Stand: Modified independent (Device/Increase time)         General transfer comment: Mod I for transfer with usage of RW.  Ambulation/Gait Ambulation/Gait assistance: Supervision Gait Distance (Feet): 200 Feet Assistive device: Rolling walker (2 wheeled) Gait Pattern/deviations: Step-through pattern;Wide base of support Gait velocity: Moderate speed   General Gait Details: Pt ambulating with SBA/SPV with no noted LOB with RW.  Stairs            Wheelchair Mobility    Modified Rankin (Stroke Patients Only)       Balance Overall balance assessment: Mild deficits observed, not formally tested           Pertinent Vitals/Pain Pain Assessment: No/denies pain    Home Living Family/patient expects to be discharged to:: Private residence Living Arrangements: Spouse/significant other Available Help at Discharge: Family Type of Home: House Home Access: Stairs to enter Entrance Stairs-Rails: Left Entrance Stairs-Number of Steps: 4 Home Layout: Two level;Able to live on main level with bedroom/bathroom;Other (Comment) (Bonus room and office on second floor)   Additional Comments: Owns rollator and SPC but did not use prior to admission. Pt reports going to gym 3x/week    Prior Function Level of Independence: Independent         Comments: No usage of AD     Hand Dominance   Dominant Hand: Right    Extremity/Trunk Assessment   Upper Extremity Assessment Upper Extremity Assessment: Overall WFL for tasks assessed    Lower Extremity Assessment Lower  Extremity Assessment: Overall WFL for tasks assessed       Communication   Communication: No difficulties  Cognition Arousal/Alertness: Awake/alert Behavior During Therapy: WFL for tasks assessed/performed Overall Cognitive Status: Within Functional Limits for tasks assessed          General Comments       Exercises     Assessment/Plan    PT Assessment Patent does not need any further PT services  PT Problem List         PT Treatment Interventions      PT Goals (Current goals can be found in the Care Plan section)  Acute Rehab PT Goals Patient Stated Goal: to return home PT Goal Formulation: With patient Time For Goal Achievement: 04/16/21 Potential to Achieve Goals: Good    Frequency     Barriers to discharge        Co-evaluation               AM-PAC PT "6 Clicks" Mobility  Outcome Measure Help needed turning from your back to your side while in a flat bed without using bedrails?: None Help needed moving from lying on your back to sitting on the side of a flat bed without using bedrails?: None Help needed moving to and from a bed to a chair (including a wheelchair)?: None Help needed standing up from a chair using your arms (e.g., wheelchair or bedside chair)?: None Help needed to walk in hospital room?: A Little Help needed climbing 3-5 steps with a railing? : A Little 6 Click Score: 22    End of Session Equipment Utilized During Treatment: Gait belt Activity Tolerance: Patient tolerated treatment well Patient left: in chair;with call bell/phone within reach;with chair alarm set Nurse Communication: Mobility status PT Visit Diagnosis: Repeated falls (R29.6);Other abnormalities of gait and mobility (R26.89)    Time: [id number] PT Time Calculation (min) (ACUTE ONLY): 23 min   Charges:   PT Evaluation $PT Eval Low Complexity: 1 Low PT Treatments $Gait Training: 8-22 mins        Andrey Campanile, SPT   Andrey Campanile 04/16/2021, 2:35 PM

## 2021-04-16 NOTE — Progress Notes (Signed)
Discharge instructions explained to pt/verbalized understanding. IVs and tele removed. Will trasnport off unit via wheelchair when ride arrives.

## 2021-04-16 NOTE — Care Management Important Message (Signed)
Important Message  Patient Details  Name: Cesar Powell MRN: [id number] Date of Birth: 01/03/1931   Medicare Important Message Given:  Yes     Dannette Barbara 04/16/2021, 11:02 AM

## 2021-04-16 NOTE — Progress Notes (Signed)
[facility]  ROUNDING NOTE   Subjective:   Cesar Powell is a 87 y.o. male admitted to [facility] ICU with complains of abdominal pain, lightheadedness, and weakness. Patient had a fall. Patient had not been able to void for over 24 hours. He was recently treated with Bactrim for E. Coli urinary tract infection. Indwelling catheter placed. Immediate urine output.   Creatinine back to baseline.    Objective:  Vital signs in last 24 hours:  Temp:  [97.5 F (36.4 C)-98.9 F (37.2 C)] 97.7 F (36.5 C) (09/14 1145) Pulse Rate:  [59-63] 60 (09/14 1145) Resp:  [16-18] 18 (09/14 1145) BP: (128-152)/(45-58) 133/45 (09/14 1145) SpO2:  [96 %-99 %] 98 % (09/14 1145) Weight:  [94.9 kg] 94.9 kg (09/14 0646)  Weight change: 1.686 kg Filed Weights   04/14/21 0130 04/14/21 1738 04/16/21 0646  Weight: 95.5 kg 93.2 kg 94.9 kg    Intake/Output: I/O last 3 completed shifts: In: 1240 [P.O.:240; Other:1000] Out: 400 [Urine:400]   Intake/Output this shift:  Total I/O In: 600 [P.O.:600] Out: 700 [Urine:700]  Physical Exam: General: NAD,   Head: Normocephalic, atraumatic. Moist oral mucosal membranes  Eyes: Anicteric  Lungs:  Clear to auscultation, normal effort  Heart: Regular rate and rhythm  Abdomen:  Soft, nontender  Extremities:  no peripheral edema.  Neurologic: Nonfocal, moving all four extremities  Skin: No lesions  GU Foley in place    Basic Metabolic Panel: Recent Labs  Lab 04/13/21 2315 04/14/21 0409 04/14/21 0904 04/15/21 0624 04/16/21 0821  NA 124* 128* 129* 132* 132*  K 6.3* 5.2* 5.1 4.2 4.2  CL 93* 94* 97* 99 98  CO2 17* 19* 20* 25 27  GLUCOSE 91 90 95 117* 113*  BUN 93* 93* 81* 28* 15  CREATININE 9.11* 8.12* 6.33* 1.50* 0.81  CALCIUM 8.3* 8.3* 8.4* 8.0* 8.0*  MG  --  2.8*  --  2.3 2.1  PHOS  --  6.2*  --  2.3* 2.1*     Liver Function Tests: Recent Labs  Lab 04/13/21 2022 04/14/21 0409  AST 32  --   ALT 30  --   ALKPHOS 63  --   BILITOT  0.7  --   PROT 6.7  --   ALBUMIN 3.3* 3.2*    Recent Labs  Lab 04/13/21 2128  LIPASE 40    No results for input(s): AMMONIA in the last 168 hours.  CBC: Recent Labs  Lab 04/13/21 2006 04/13/21 2022 04/13/21 2315 04/14/21 0409 04/16/21 0830  WBC 13.6* 13.6* 12.7* 11.6* 8.5  NEUTROABS 12.4*  --   --  10.4*  --   HGB 8.8* 8.9* 9.2* 8.9* 8.7*  HCT 25.5* 25.4* 26.8* 25.3* 25.9*  MCV 87.6 88.5 87.9 88.2 90.9  PLT 318 298 233 311 277     Cardiac Enzymes: No results for input(s): CKTOTAL, CKMB, CKMBINDEX, TROPONINI in the last 168 hours.  BNP: Invalid input(s): POCBNP  CBG: Recent Labs  Lab 04/14/21 1749 04/15/21 0648 04/15/21 1253 04/15/21 1625 04/15/21 2115  GLUCAP 138* 124* 119* 105* 114*     Microbiology: Results for orders placed or performed during the hospital encounter of 04/13/21  Urine Culture     Status: Abnormal (Preliminary result)   Collection Time: 04/13/21  9:28 PM   Specimen: Urine, Catheterized  Result Value Ref Range Status   Specimen Description   Final    URINE, CATHETERIZED Performed at [facility], [address] [place], [place] [address]  Special Requests   Final    NONE Performed at [facility], [address] [place], [place] [address]    Culture (A)  Final    2,000 COLONIES/mL GRAM NEGATIVE RODS SUSCEPTIBILITIES TO FOLLOW Performed at [facility], [place] [address], [place], [place] [address]    Report Status PENDING  Incomplete  Resp Panel by RT-PCR (Flu A&B, Covid) Nasopharyngeal Swab     Status: None   Collection Time: 04/13/21 11:15 PM   Specimen: Nasopharyngeal Swab; Nasopharyngeal(NP) swabs in vial transport medium  Result Value Ref Range Status   SARS Coronavirus 2 by RT PCR NEGATIVE NEGATIVE Final    Comment: (NOTE) SARS-CoV-2 target nucleic acids are NOT DETECTED.  The SARS-CoV-2 RNA is generally detectable in upper respiratory specimens during the acute phase of infection. The  lowest concentration of SARS-CoV-2 viral copies this assay can detect is 138 copies/mL. A negative result does not preclude SARS-Cov-2 infection and should not be used as the sole basis for treatment or other patient management decisions. A negative result may occur with  improper specimen collection/handling, submission of specimen other than nasopharyngeal swab, presence of viral mutation(s) within the areas targeted by this assay, and inadequate number of viral copies(<138 copies/mL). A negative result must be combined with clinical observations, patient history, and epidemiological information. The expected result is Negative.  Fact Sheet for Patients:  EntrepreneurPulse.com.au  Fact Sheet for Healthcare Providers:  IncredibleEmployment.be  This test is no t yet approved or cleared by the [place] FDA and  has been authorized for detection and/or diagnosis of SARS-CoV-2 by FDA under an Emergency Use Authorization (EUA). This EUA will remain  in effect (meaning this test can be used) for the duration of the COVID-19 declaration under Section 564(b)(1) of the Act, 21 U.S.C.section 360bbb-3(b)(1), unless the authorization is terminated  or revoked sooner.       Influenza A by PCR NEGATIVE NEGATIVE Final   Influenza B by PCR NEGATIVE NEGATIVE Final    Comment: (NOTE) The Xpert Xpress SARS-CoV-2/FLU/RSV plus assay is intended as an aid in the diagnosis of influenza from Nasopharyngeal swab specimens and should not be used as a sole basis for treatment. Nasal washings and aspirates are unacceptable for Xpert Xpress SARS-CoV-2/FLU/RSV testing.  Fact Sheet for Patients: EntrepreneurPulse.com.au  Fact Sheet for Healthcare Providers: IncredibleEmployment.be  This test is not yet approved or cleared by the [place] FDA and has been authorized for detection and/or diagnosis of SARS-CoV-2 by FDA under  an Emergency Use Authorization (EUA). This EUA will remain in effect (meaning this test can be used) for the duration of the COVID-19 declaration under Section 564(b)(1) of the Act, 21 U.S.C. section 360bbb-3(b)(1), unless the authorization is terminated or revoked.  Performed at [facility], [address] [place], [place] [address]   MRSA Next Gen by PCR, Nasal     Status: None   Collection Time: 04/14/21  6:20 AM   Specimen: Nasal Mucosa; Nasal Swab  Result Value Ref Range Status   MRSA by PCR Next Gen NOT DETECTED NOT DETECTED Final    Comment: (NOTE) The GeneXpert MRSA Assay (FDA approved for NASAL specimens only), is one component of a comprehensive MRSA colonization surveillance program. It is not intended to diagnose MRSA infection nor to guide or monitor treatment for MRSA infections. Test performance is not FDA approved in patients less than 85 years old. Performed at [facility], [address], [place], [place] [address]  Coagulation Studies: No results for input(s): LABPROT, INR in the last 72 hours.  Urinalysis: Recent Labs    04/13/21 2128  COLORURINE YELLOW  LABSPEC 1.010  PHURINE 5.0  GLUCOSEU NEGATIVE  HGBUR LARGE*  BILIRUBINUR NEGATIVE  KETONESUR NEGATIVE  PROTEINUR NEGATIVE  NITRITE NEGATIVE  LEUKOCYTESUR TRACE*       Imaging: No results found.   Medications:    cefTRIAXone (ROCEPHIN)  IV 2 g (04/15/21 2334)    ascorbic acid  1,000 mg Oral Daily   Chlorhexidine Gluconate Cloth  6 each Topical Daily   cholecalciferol  2,000 Units Oral Daily   heparin  5,000 Units Subcutaneous Q8H   multivitamin with minerals  1 tablet Oral Daily   phosphorus  500 mg Oral Q4H   docusate sodium, hydrALAZINE, ondansetron (ZOFRAN) IV, polyethylene glycol  Assessment/ Plan:  Cesar Powell is a 87 y.o.  male With prostate cancer, hypertension, and sleep apnea was admitted to [facility] with Hyperkalemia [E87.5] Urinary retention  [R33.9] Hyponatremia [E87.1] Elevated troponin [R77.8] Acute renal failure (ARF) (HCC) [N17.9] Elevated brain natriuretic peptide (BNP) level [R79.89] AKI (acute kidney injury) ([place]) [N17.9] Acute kidney injury ([place]) [N17.9]   Acute Kidney Injury with baseline creatinine 0.93 and GFR of >60 on 03/22/21.  Acute kidney injury secondary to obstructive uropathy and bactrim Creatinine back to baseline  Lab Results  Component Value Date   CREATININE 0.81 04/16/2021   CREATININE 1.50 (H) 04/15/2021   CREATININE 6.33 (H) 04/14/2021    Intake/Output Summary (Last 24 hours) at 04/16/2021 1341 Last data filed at 04/16/2021 1145 Gross per 24 hour  Intake 1840 ml  Output 1100 ml  Net 740 ml    2. Hyperkalemia secondary to AKI and bactrim. May need to avoid this medication in this patient in the future. Improved to 4.2  3. Hyponatremia Improved to 132 - encourage po intake.   4. Metabolic acidosis: secondary to AKI, improving  5. Anemia with renal failure: Will monitor Hgb  6 Hypotension: Tamsulosin held   LOS: 3 Cesar Powell 9/14/20221:41 PM

## 2021-04-17 ENCOUNTER — Ambulatory Visit: Payer: Medicare Other | Admitting: Oncology

## 2021-04-17 ENCOUNTER — Other Ambulatory Visit: Payer: Medicare Other

## 2021-04-17 LAB — URINE CULTURE: Culture: 2000 — AB

## 2021-04-22 ENCOUNTER — Ambulatory Visit (INDEPENDENT_AMBULATORY_CARE_PROVIDER_SITE_OTHER): Payer: Medicare Other | Admitting: Physician Assistant

## 2021-04-22 ENCOUNTER — Other Ambulatory Visit: Payer: Self-pay

## 2021-04-22 ENCOUNTER — Ambulatory Visit: Payer: Medicare Other | Admitting: Physician Assistant

## 2021-04-22 ENCOUNTER — Encounter: Payer: Self-pay | Admitting: Emergency Medicine

## 2021-04-22 DIAGNOSIS — Z8546 Personal history of malignant neoplasm of prostate: Secondary | ICD-10-CM | POA: Insufficient documentation

## 2021-04-22 DIAGNOSIS — I10 Essential (primary) hypertension: Secondary | ICD-10-CM | POA: Diagnosis not present

## 2021-04-22 DIAGNOSIS — Z79899 Other long term (current) drug therapy: Secondary | ICD-10-CM | POA: Diagnosis not present

## 2021-04-22 DIAGNOSIS — Z87891 Personal history of nicotine dependence: Secondary | ICD-10-CM | POA: Diagnosis not present

## 2021-04-22 DIAGNOSIS — R339 Retention of urine, unspecified: Secondary | ICD-10-CM | POA: Diagnosis not present

## 2021-04-22 LAB — CBC
HCT: 26.6 % — ABNORMAL LOW (ref 39.0–52.0)
Hemoglobin: 8.6 g/dL — ABNORMAL LOW (ref 13.0–17.0)
MCH: 30.6 pg (ref 26.0–34.0)
MCHC: 32.3 g/dL (ref 30.0–36.0)
MCV: 94.7 fL (ref 80.0–100.0)
Platelets: 355 10*3/uL (ref 150–400)
RBC: 2.81 MIL/uL — ABNORMAL LOW (ref 4.22–5.81)
RDW: 14.9 % (ref 11.5–15.5)
WBC: 8.3 10*3/uL (ref 4.0–10.5)
nRBC: 0 % (ref 0.0–0.2)

## 2021-04-22 LAB — BASIC METABOLIC PANEL
Anion gap: 9 (ref 5–15)
BUN: 31 mg/dL — ABNORMAL HIGH (ref 8–23)
CO2: 26 mmol/L (ref 22–32)
Calcium: 8.6 mg/dL — ABNORMAL LOW (ref 8.9–10.3)
Chloride: 101 mmol/L (ref 98–111)
Creatinine, Ser: 1.05 mg/dL (ref 0.61–1.24)
GFR, Estimated: >60 mL/min (ref >60)
Glucose, Bld: 108 mg/dL — ABNORMAL HIGH (ref 70–99)
Potassium: 4.1 mmol/L (ref 3.5–5.1)
Sodium: 136 mmol/L (ref 135–145)

## 2021-04-22 LAB — BLADDER SCAN AMB NON-IMAGING

## 2021-04-22 MED ORDER — LIDOCAINE HCL URETHRAL/MUCOSAL 2 % EX GEL: 1.0000 "application " | Freq: Once | CUTANEOUS | Status: DC | PRN

## 2021-04-22 NOTE — ED Notes (Signed)
Pt had bladder scan of greater than 200, upon inspection of foley catheter, pt had 14 french in place, however, it was no longer in the penis. The tip of the catheter was stuck in pt's groin area.  There was about 100cc pink tinged  urine in the leg bag as well.   Discussed with Dr. Archie Balboa, made him aware, pt stated the urology office had difficulty passing the catheter through his prostate and they also had issues on Saturday night.  Pt stated when he was in the hospital the urologist had to come in to place a catheter.   Pt updated on plan to draw blood work and will attempt catheter once he is in a treatment room.

## 2021-04-22 NOTE — ED Triage Notes (Signed)
Pt arrived via POV with reports of going to urology to have catheter removed, catheter was removed and replaced, pt has had no output since replaced at 1pm.  Pt denies any pain at this time.

## 2021-04-23 ENCOUNTER — Emergency Department
Admission: EM | Admit: 2021-04-23 | Discharge: 2021-04-23 | Disposition: A | Discharge status: Home / Self Care | Payer: Medicare Other | Attending: Emergency Medicine | Admitting: Emergency Medicine

## 2021-04-23 ENCOUNTER — Ambulatory Visit: Payer: Medicare Other | Admitting: Family Medicine

## 2021-04-23 ENCOUNTER — Other Ambulatory Visit: Payer: Self-pay | Admitting: Urology

## 2021-04-23 DIAGNOSIS — R339 Retention of urine, unspecified: Secondary | ICD-10-CM

## 2021-04-23 LAB — URINALYSIS, COMPLETE (UACMP) WITH MICROSCOPIC
Bacteria, UA: NONE SEEN
Bilirubin Urine: NEGATIVE
Glucose, UA: NEGATIVE mg/dL
Ketones, ur: NEGATIVE mg/dL
Nitrite: NEGATIVE
Protein, ur: 30 mg/dL — AB
Specific Gravity, Urine: 1.015 (ref 1.005–1.030)
Squamous Epithelial / HPF: NONE SEEN (ref 0–5)
pH: 6 (ref 5.0–8.0)

## 2021-04-23 MED ORDER — SULFAMETHOXAZOLE-TRIMETHOPRIM 800-160 MG PO TABS: 1.0000 | ORAL_TABLET | Freq: Two times a day (BID) | ORAL | 0 refills | Status: AC

## 2021-04-23 NOTE — Progress Notes (Signed)
Simple Catheter Placement  Due to urinary retention patient is present today for a foley cath placement.  Patient was cleaned and prepped in a sterile fashion with betadine and 2% lidocaine jelly was instilled into the urethra. A 14 FR silicone coude foley catheter was inserted, urine return was noted 583ml, urine was yellow in color.  The balloon was filled with 10cc of sterile water.  A leg bag was attached for drainage. Patient tolerated well, no complications were noted   Performed by: Debroah Loop, PA-C and Hollice Espy, MD

## 2021-04-23 NOTE — Procedures (Signed)
Procedure note:  Diagnosis: Urinary retention  Procedure: Complex Foley catheter placement  Description: External genitalia were prepped and draped sterilely.  A 0.038 Sensor wire was passed per urethra and resistance was encountered in the region of the bulbar/prostatic urethra.  With gentle manipulation the wire was able to be advanced by the resistance and easily advanced without difficulty.  A 5 French open-ended ureteral catheter was then advanced over the guidewire.  The guidewire was removed and clear urine was aspirated from the 5 French catheter.  The guidewire was replaced and a 22 French [place] catheter was advanced over the guidewire with resistance met in the region of the prostatic urethra.  Urine was collected for culture.  The balloon was inflated with 10 cc of sterile water and placed to gravity drainage.  Plan: Follow-up 7-10 days for cystoscopy with Dr. Roxanna Mew DS 1 tab twice daily Urine culture sent   John Giovanni, MD

## 2021-04-23 NOTE — Consult Note (Signed)
Urology Consult   Chief Complaint: Unable to urinate  History of Present Illness: Cesar Powell is a 87 y.o. male with a complicated history of prostate cancer currently followed by Dr. Diamantina Providence.  Refer to his office note 01/25/2020 for a clinical summary.  Presented to ED 04/13/2021 with acute urinary retention and a Foley catheter was unable to be placed by ED staff.  He was seen by Dr. Abner Greenspan and underwent catheter placement over a wire via cystoscopic guidance.  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  He was seen in the office yesterday for voiding trial and states he was unable to void at all and underwent placement of a 14 French Foley catheter.  Last night he noted there was no urine draining from the catheter and presented to the ED and at the time of that evaluation the catheter was not in the penis.  ED staff was unsuccessful in catheter placement.  When I arrived to see him his bladder scan volume was estimated at 800 mL.  Denies fever, chills  Past Medical History:  Diagnosis Date   H/O prostate cancer    was treated with radiation   Hypertension    Local recurrence of prostate cancer ([place]) 06/21/2004   Qualifier: Diagnosis of  By: Fuller Plan CMA (AAMA), [place]     Obstructive sleep apnea 06/22/2007   NPSG [place] 1979 Unattended Home sleep Study- 05/10/14- Confirms severe OSA, AHI 41.8/ hr, weight 230 pounds CPAP and also Transcend portable CPAP auto 8-15     Sleep apnea 1977   uses C-pap     Past Surgical History:  Procedure Laterality Date   ABDOMINAL SURGERY     ruptured intestines    CATARACT EXTRACTION W/PHACO Left 01/29/2016   Procedure: CATARACT EXTRACTION PHACO AND INTRAOCULAR LENS PLACEMENT ([place]) left eye;  Surgeon: Leandrew Koyanagi, MD;  Location: [facility];  Service: Ophthalmology;  Laterality: Left;   RESTOR LENS   CATARACT EXTRACTION W/PHACO Right 10/21/2016   Procedure: CATARACT EXTRACTION PHACO AND INTRAOCULAR LENS PLACEMENT ([facility])  Right restor toric lens;  Surgeon: Leandrew Koyanagi, MD;  Location: [facility];  Service: Ophthalmology;  Laterality: Right;  restor toric lens   CYSTOSCOPY WITH LITHOLAPAXY N/A 08/14/2016   Procedure: CYSTOSCOPY WITH LITHOLAPAXY;  Surgeon: Nickie Retort, MD;  Location: [facility];  Service: Urology;  Laterality: N/A;   FEMUR IM NAIL Right 10/08/2014   Procedure: INTRAMEDULLARY (IM) NAIL FEMORAL;  Surgeon: Rod Can, MD;  Location: [facility];  Service: Orthopedics;  Laterality: Right;  PER DANIELLE 110 MIN   HOLMIUM LASER APPLICATION  2/58/5277   Procedure: HOLMIUM LASER APPLICATION;  Surgeon: Nickie Retort, MD;  Location: [facility];  Service: Urology;;   OTHER SURGICAL HISTORY  2006   Prostate Radiation Surgery    RADIOACTIVE SEED IMPLANT N/A 10/30/2019   Procedure: RADIOACTIVE SEED IMPLANT/BRACHYTHERAPY IMPLANT;  Surgeon: Billey Co, MD;  Location: [facility];  Service: Urology;  Laterality: N/A;  41 seeds implanted    Home Medications:  No outpatient medications have been marked as taking for the 04/23/21 encounter [facility] Encounter).    Allergies:  Allergies  Allergen Reactions   Gluten Meal Rash    Family History  Problem Relation Age of Onset   Heart disease Brother    Heart attack Brother    Lung cancer Brother  Prostate cancer Neg Hx    Bladder Cancer Neg Hx    Kidney cancer Neg Hx     Social History:  reports that he quit smoking about 55 years ago. His smoking use included cigarettes. He has a 15.00 pack-year smoking history. He has never used smokeless tobacco. He reports that he does not currently use alcohol. He reports that he does not use drugs.  ROS: Otherwise noncontributory septa as per the HPI  Physical Exam:  Vital signs in last 24 hours: Temp:  [98.6 F (37 C)] 98.6 F (37 C) (09/20 2240) Pulse  Rate:  [65-70] 65 (09/21 0224) Resp:  [18] 18 (09/21 0224) BP: (139-151)/(56-58) 139/56 (09/21 0224) SpO2:  [99 %-100 %] 99 % (09/21 0224) Weight:  [95.3 kg] 95.3 kg (09/20 2241) Constitutional:  Alert and oriented, No acute distress HEENT: [place] AT, moist mucus membranes.  Trachea midline, no masses Cardiovascular: Regular rate and rhythm, no clubbing, cyanosis, or edema. Respiratory: Normal respiratory effort, lungs clear bilaterally GI: Abdomen is soft, nontender, nondistended, no abdominal masses GU: Phallus without lesions, meatus normal. Neurologic: Grossly intact, no focal deficits, moving all 4 extremities Psychiatric: Normal mood and affect   Laboratory Data:  Recent Labs    04/22/21 2306  WBC 8.3  HGB 8.6*  HCT 26.6*   Recent Labs    04/22/21 2306  NA 136  K 4.1  CL 101  CO2 26  GLUCOSE 108*  BUN 31*  CREATININE 1.05  CALCIUM 8.6*   No results for input(s): LABPT, INR in the last 72 hours. No results for input(s): LABURIN in the last 72 hours. Results for orders placed or performed during the hospital encounter of 04/13/21  Urine Culture     Status: Abnormal   Collection Time: 04/13/21  9:28 PM   Specimen: Urine, Catheterized  Result Value Ref Range Status   Specimen Description   Final    URINE, CATHETERIZED Performed at [facility], [address], [place], [place] [address]    Special Requests   Final    NONE Performed at [facility], [place], [place] [address]    Culture 2,000 COLONIES/mL ESCHERICHIA COLI (A)  Final   Report Status 04/17/2021 FINAL  Final   Organism ID, Bacteria ESCHERICHIA COLI (A)  Final      Susceptibility   Escherichia coli - MIC*    AMPICILLIN >=32 RESISTANT Resistant     CEFAZOLIN 8 SENSITIVE Sensitive     CEFEPIME <=0.12 SENSITIVE Sensitive     CEFTRIAXONE 0.5 SENSITIVE Sensitive     CIPROFLOXACIN <=0.25 SENSITIVE Sensitive     GENTAMICIN <=1 SENSITIVE Sensitive     IMIPENEM <=0.25  SENSITIVE Sensitive     NITROFURANTOIN <=16 SENSITIVE Sensitive     TRIMETH/SULFA <=20 SENSITIVE Sensitive     AMPICILLIN/SULBACTAM 16 INTERMEDIATE Intermediate     PIP/TAZO <=4 SENSITIVE Sensitive     * 2,000 COLONIES/mL ESCHERICHIA COLI  Resp Panel by RT-PCR (Flu A&B, Covid) Nasopharyngeal Swab     Status: None   Collection Time: 04/13/21 11:15 PM   Specimen: Nasopharyngeal Swab; Nasopharyngeal(NP) swabs in vial transport medium  Result Value Ref Range Status   SARS Coronavirus 2 by RT PCR NEGATIVE NEGATIVE Final    Comment: (NOTE) SARS-CoV-2 target nucleic acids are NOT DETECTED.  The SARS-CoV-2 RNA is generally detectable in upper respiratory specimens during the acute phase of infection. The lowest concentration of SARS-CoV-2 viral copies this assay can detect is 138 copies/mL. A  negative result does not preclude SARS-Cov-2 infection and should not be used as the sole basis for treatment or other patient management decisions. A negative result may occur with  improper specimen collection/handling, submission of specimen other than nasopharyngeal swab, presence of viral mutation(s) within the areas targeted by this assay, and inadequate number of viral copies(<138 copies/mL). A negative result must be combined with clinical observations, patient history, and epidemiological information. The expected result is Negative.  Fact Sheet for Patients:  EntrepreneurPulse.com.au  Fact Sheet for Healthcare Providers:  IncredibleEmployment.be  This test is no t yet approved or cleared by the [place] FDA and  has been authorized for detection and/or diagnosis of SARS-CoV-2 by FDA under an Emergency Use Authorization (EUA). This EUA will remain  in effect (meaning this test can be used) for the duration of the COVID-19 declaration under Section 564(b)(1) of the Act, 21 U.S.C.section 360bbb-3(b)(1), unless the authorization is terminated  or  revoked sooner.       Influenza A by PCR NEGATIVE NEGATIVE Final   Influenza B by PCR NEGATIVE NEGATIVE Final    Comment: (NOTE) The Xpert Xpress SARS-CoV-2/FLU/RSV plus assay is intended as an aid in the diagnosis of influenza from Nasopharyngeal swab specimens and should not be used as a sole basis for treatment. Nasal washings and aspirates are unacceptable for Xpert Xpress SARS-CoV-2/FLU/RSV testing.  Fact Sheet for Patients: EntrepreneurPulse.com.au  Fact Sheet for Healthcare Providers: IncredibleEmployment.be  This test is not yet approved or cleared by the [place] FDA and has been authorized for detection and/or diagnosis of SARS-CoV-2 by FDA under an Emergency Use Authorization (EUA). This EUA will remain in effect (meaning this test can be used) for the duration of the COVID-19 declaration under Section 564(b)(1) of the Act, 21 U.S.C. section 360bbb-3(b)(1), unless the authorization is terminated or revoked.  Performed at [facility], [address] [place], [place] [address]   MRSA Next Gen by PCR, Nasal     Status: None   Collection Time: 04/14/21  6:20 AM   Specimen: Nasal Mucosa; Nasal Swab  Result Value Ref Range Status   MRSA by PCR Next Gen NOT DETECTED NOT DETECTED Final    Comment: (NOTE) The GeneXpert MRSA Assay (FDA approved for NASAL specimens only), is one component of a comprehensive MRSA colonization surveillance program. It is not intended to diagnose MRSA infection nor to guide or monitor treatment for MRSA infections. Test performance is not FDA approved in patients less than 62 years old. Performed at [facility], [address], [place], [place] [address]      Impression:  1.  Urinary retention 65 [place] council catheter placed over a wire; refer to my procedure note for details  Plan:  Resistance was noted in the region of the prostatic urethra on catheter placement and I  suspect his retention is not due to stricture but prostatic/bladder neck obstruction Prostatic urethra was not described at the time of his prior cystoscopy He is tentatively scheduled for catheter removal next week however he was unable to void with an indwelling Foley in place for approximately 10 days Recommend scheduling cystoscopy with Dr. Diamantina Providence to further evaluate his outlet Urine culture at his previous hospitalization grew 2000 colonies of E. coli.  He received ceftriaxone during that hospitalization and was not discharged on oral antibiotics. Urine culture was sent this morning Rx Septra DS sent to pharmacy pending culture report   04/23/2021, 5:08 AM  John Giovanni,  MD

## 2021-04-23 NOTE — Progress Notes (Signed)
04/22/2021 4:06 PM   Cesar Powell [birth year] [id number]  CC: Chief Complaint  Patient presents with   Urinary Retention   HPI: Cesar Powell is a 87 y.o. male with metastatic castrate resistant prostate cancer initially diagnosed in 2005 s/p RT, brachytherapy, and intermittent ADT (ADT stopped due to Powell effects) who was recently admitted with AKI 2/2 urinary retention requiring Foley placement over a guidewire with Dr. Abner Greenspan who presents today for voiding trial. 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 Abner Greenspan was able to place a 16Fr catheter without performing dilation.  Today he reports feeling well with no acute concerns.  Foley catheter removed in the morning, see procedure note below for further details. Patient returned to clinic in the afternoon. He was unable to void but denies abdominal pain. PVR 57mL.  PMH: Past Medical History:  Diagnosis Date   H/O prostate cancer    was treated with radiation   Hypertension    Local recurrence of prostate cancer ([place]) 06/21/2004   Qualifier: Diagnosis of  By: Fuller Plan CMA (AAMA), [place]     Obstructive sleep apnea 06/22/2007   [place]PSG [place] Unattended Home sleep Study- 05/10/14- Confirms severe OSA, AHI 41.8/ hr, weight 230 pounds CPAP and also Transcend portable CPAP auto 8-15     Sleep apnea 1977   uses C-pap     Surgical History: Past Surgical History:  Procedure Laterality Date   ABDOMINAL SURGERY     ruptured intestines    CATARACT EXTRACTION W/PHACO Left 01/29/2016   Procedure: CATARACT EXTRACTION PHACO AND INTRAOCULAR LENS PLACEMENT ([place]) left eye;  Surgeon: Leandrew Koyanagi, MD;  Location: [facility] [place];  Service: Ophthalmology;  Laterality: Left;  RESTOR LENS   CATARACT EXTRACTION W/PHACO Right 10/21/2016   Procedure: CATARACT EXTRACTION PHACO AND INTRAOCULAR LENS PLACEMENT ([facility])  Right restor toric lens;  Surgeon: Leandrew Koyanagi, MD;  Location: [facility];  Service: Ophthalmology;  Laterality: Right;  restor toric lens   CYSTOSCOPY WITH LITHOLAPAXY N/A 08/14/2016   Procedure: CYSTOSCOPY WITH LITHOLAPAXY;  Surgeon: Nickie Retort, MD;  Location: [facility];  Service: Urology;  Laterality: N/A;   FEMUR IM NAIL Right 10/08/2014   Procedure: INTRAMEDULLARY (IM) NAIL FEMORAL;  Surgeon: Rod Can, MD;  Location: [place];  Service: Orthopedics;  Laterality: Right;  PER DANIELLE 110 MIN   HOLMIUM LASER APPLICATION  2/29/7989   Procedure: HOLMIUM LASER APPLICATION;  Surgeon: Nickie Retort, MD;  Location: [facility];  Service: Urology;;   OTHER SURGICAL HISTORY  2006   Prostate Radiation Surgery    RADIOACTIVE SEED IMPLANT N/A 10/30/2019   Procedure: RADIOACTIVE SEED IMPLANT/BRACHYTHERAPY IMPLANT;  Surgeon: Billey Co, MD;  Location: [facility];  Service: Urology;  Laterality: N/A;  41 seeds implanted    Home Medications:  Allergies as of 04/22/2021       Reactions   Gluten Meal Rash        Medication List        Accurate as of April 22, 2021 11:59 PM. If you have any questions, ask your nurse or doctor.          ascorbic acid 500 MG tablet Commonly known as: VITAMIN C Take 1,000 mg by mouth daily.   Immune Enhance Tabs Take 1 tablet by mouth daily.   JOINT HEALTH PO Take 2 tablets by mouth daily.   IMMUNE FORMULA PO Take 2 capsules by mouth daily. Nutriferon by [employer]  multivitamin with minerals Tabs tablet Take 2 tablets by mouth daily. Vita-Lea   PROBIOTIC PO Take 1 capsule by mouth daily.   RESVERATROL PO Take 2 capsules by mouth daily. Vivix   tamsulosin 0.4 MG Caps capsule Commonly known as: FLOMAX Take 1 capsule (0.4 mg total) by mouth daily after supper.   TURMERIC PO Take 1 capsule by mouth daily.   Vitamin D3 50 MCG (2000 UT) Tabs Take 2,000 Units by mouth daily.   ZINC PO Take 1 tablet by mouth daily.        Allergies:  Allergies  Allergen Reactions   Gluten Meal Rash     Family History: Family History  Problem Relation Age of Onset   Heart disease Brother    Heart attack Brother    Lung cancer Brother    Prostate cancer Neg Hx    Bladder Cancer Neg Hx    Kidney cancer Neg Hx     Social History:   reports that he quit smoking about 55 years ago. His smoking use included cigarettes. He has a 15.00 pack-year smoking history. He has never used smokeless tobacco. He reports that he does not currently use alcohol. He reports that he does not use drugs.  Physical Exam: There were no vitals taken for this visit.  Constitutional:  Alert and oriented, no acute distress, nontoxic appearing HEENT: [place], AT Cardiovascular: No clubbing, cyanosis, or edema Respiratory: Normal respiratory effort, no increased work of breathing Skin: No rashes, bruises or suspicious lesions Neurologic: Grossly intact, no focal deficits, moving all 4 extremities Psychiatric: Normal mood and affect  Laboratory Data: Results for orders placed or performed in visit on 04/22/21  Bladder Scan (Post Void Residual) in office  Result Value Ref Range   Scan Result 523mL    Catheter Removal  Patient is present today for a catheter removal.  63ml of water was drained from the balloon. A 16FR Council tip foley cath was removed from the bladder no complications were noted . Patient tolerated well.  Performed by: Debroah Loop, PA-C   Follow up/ Additional notes: Push fluids and RTC this afternoon for PVR.   Assessment & Plan:   1. Urinary retention Voiding trial failed today. I offered the patient Foley replacement versus CIC teaching. He ultimately elected for the former, particularly due to concerns about difficulty advancing a catheter past his stricture. I initially attempted to place a 16Fr Foley catheter in clinic today, but was unable to advance the catheter into the bladder. I requested Dr. Cherrie Gauze assistance, who was able to easily place a 96GE silicone coude catheter;  see separate procedure note for details.  Will plan for repeat voiding trial with afternoon visit with Dr. Diamantina Providence in 1-2 weeks. Patient is in agreement with this plan. - Bladder Scan (Post Void Residual) in office  Return in about 1 week (around 04/29/2021) for Voiding trial.  Debroah Loop, PA-C  [facility] [address], [place], [place] [address] [phone]

## 2021-04-23 NOTE — ED Notes (Signed)
Education provided to pt with hands on training for leg bag placement as well as catheter care. Pt provided with all essentials needed for catheter care and instructed to follow up based on contact listed in D/C paperwork.

## 2021-04-23 NOTE — ED Provider Notes (Signed)
[facility] Emergency Department Provider Note  ____________________________________________   Event Date/Time   First MD Initiated Contact with Patient 04/23/21 0133     (approximate)  I have reviewed the triage vital signs and the nursing notes.   HISTORY  Chief Complaint Urinary Retention    HPI Cesar Powell is a 87 y.o. male with history of prostate cancer who presents to the emergency department with urinary retention.  Patient was recently admitted to the hospital on 04/13/21 - 04/16/21 for postobstructive renal failure due to urinary retention.  Had to have a 25 [place] council tip Foley placed over a wire under cystoscopy by urology.  Was seen in the urology office yesterday the 20th in the morning to have his Foley catheter removed.  States he had to go back that afternoon around 1 PM because he had urinary retention again.  Had a 84 French catheter placed by the PA.  States that he noticed that he did not have much urine output and was becoming increasingly uncomfortable.  When he arrived to the emergency department with a Foley catheter had completely dislodged.  He states he has not been able to urinate at all today.  No fevers, vomiting.        Past Medical History:  Diagnosis Date   H/O prostate cancer    was treated with radiation   Hypertension    Local recurrence of prostate cancer ([place]) 06/21/2004   Qualifier: Diagnosis of  By: Fuller Plan CMA (AAMA), [place]     Obstructive sleep apnea 06/22/2007   [employer]PSG [place] 1979 Unattended Home sleep Study- 05/10/14- Confirms severe OSA, AHI 41.8/ hr, weight 230 pounds CPAP and also Transcend portable CPAP auto 8-15     Sleep apnea 1977   uses C-pap     Patient Active Problem List   Diagnosis Date Noted   Urinary retention    Elevated troponin    Elevated brain natriuretic peptide (BNP) level    Hyponatremia    Hyperkalemia    AKI (acute kidney injury) ([place]) 04/13/2021   Goals of care,  counseling/discussion 08/08/2019   Prostate cancer ([place]) 08/08/2019   Subtrochanteric fracture of femur ([place]) 10/08/2014   Essential hypertension 78/58/8502   METABOLIC SYNDROME X 77/41/2878   Obstructive sleep apnea 06/22/2007   Local recurrence of prostate cancer ([place]) 06/22/2007   DIVERTICULITIS, HX OF 06/22/2007    Past Surgical History:  Procedure Laterality Date   ABDOMINAL SURGERY     ruptured intestines    CATARACT EXTRACTION W/PHACO Left 01/29/2016   Procedure: CATARACT EXTRACTION PHACO AND INTRAOCULAR LENS PLACEMENT ([place]) left eye;  Surgeon: Leandrew Koyanagi, MD;  Location: [facility];  Service: Ophthalmology;  Laterality: Left;  RESTOR LENS   CATARACT EXTRACTION W/PHACO Right 10/21/2016   Procedure: CATARACT EXTRACTION PHACO AND INTRAOCULAR LENS PLACEMENT ([facility])  Right restor toric lens;  Surgeon: Leandrew Koyanagi, MD;  Location: [place];  Service: Ophthalmology;  Laterality: Right;  restor toric lens   CYSTOSCOPY WITH LITHOLAPAXY N/A 08/14/2016   Procedure: CYSTOSCOPY WITH LITHOLAPAXY;  Surgeon: Nickie Retort, MD;  Location: [facility];  Service: Urology;  Laterality: N/A;   FEMUR IM NAIL Right 10/08/2014   Procedure: INTRAMEDULLARY (IM) NAIL FEMORAL;  Surgeon: Rod Can, MD;  Location: [place];  Service: Orthopedics;  Laterality: Right;  PER DANIELLE 110 MIN   HOLMIUM LASER APPLICATION  6/76/7209   Procedure: HOLMIUM LASER APPLICATION;  Surgeon: Nickie Retort, MD;  Location: [facility];  Service:  Urology;;   OTHER SURGICAL HISTORY  2006   Prostate Radiation Surgery    RADIOACTIVE SEED IMPLANT N/A 10/30/2019   Procedure: RADIOACTIVE SEED IMPLANT/BRACHYTHERAPY IMPLANT;  Surgeon: Billey Co, MD;  Location: [facility];  Service: Urology;  Laterality: N/A;  41 seeds implanted    Prior to Admission medications   Medication Sig Start Date End Date Taking? Authorizing Provider  sulfamethoxazole-trimethoprim (BACTRIM DS) 800-160 MG tablet Take 1  tablet by mouth 2 (two) times daily for 7 days. 04/23/21 04/30/21  Stoioff, Ronda Fairly, MD  ascorbic acid (VITAMIN C) 500 MG tablet Take 1,000 mg by mouth daily.    [provider]  Cholecalciferol (VITAMIN D3) 50 MCG (2000 UT) TABS Take 2,000 Units by mouth daily.    [provider]  Misc Natural Products (IMMUNE FORMULA PO) Take 2 capsules by mouth daily. Nutriferon by [employer], Historical, MD  Misc Natural Products (JOINT HEALTH PO) Take 2 tablets by mouth daily.    [provider]  Multiple Vitamin (MULTIVITAMIN WITH MINERALS) TABS tablet Take 2 tablets by mouth daily. Vita-Lea    [provider]  Multiple Vitamins-Minerals (ZINC PO) Take 1 tablet by mouth daily.    [provider]  Nutritional Supplements (IMMUNE ENHANCE) TABS Take 1 tablet by mouth daily.    [provider]  Probiotic Product (PROBIOTIC PO) Take 1 capsule by mouth daily.    [provider]  RESVERATROL PO Take 2 capsules by mouth daily. Vivix    [provider]  tamsulosin (FLOMAX) 0.4 MG CAPS capsule Take 1 capsule (0.4 mg total) by mouth daily after supper. 04/16/21   Bonnielee Haff, MD  TURMERIC PO Take 1 capsule by mouth daily.    [provider]    Allergies Gluten meal  Family History  Problem Relation Age of Onset   Heart disease Brother    Heart attack Brother    Lung cancer Brother    Prostate cancer Neg Hx    Bladder Cancer Neg Hx    Kidney cancer Neg Hx     Social History Social History   Tobacco Use   Smoking status: Former    Packs/day: 1.00    Years: 15.00    Pack years: 15.00    Types: Cigarettes    Quit date: 09/14/1965    Years since quitting: 55.6   Smokeless tobacco: Never  Vaping Use   Vaping Use: Never used  Substance Use Topics   Alcohol use: Not Currently    Comment: occassiona;    Drug use: No    Review of Systems Constitutional: No fever. Eyes: No visual changes. ENT: No sore  throat. Cardiovascular: Denies chest pain. Respiratory: Denies shortness of breath. Gastrointestinal: No nausea, vomiting, diarrhea. Genitourinary: Negative for dysuria. Musculoskeletal: Negative for back pain. Skin: Negative for rash. Neurological: Negative for focal weakness or numbness.  ____________________________________________   PHYSICAL EXAM:  VITAL SIGNS: ED Triage Vitals  Enc Vitals Group     BP 04/22/21 2240 (!) 151/58     Pulse Rate 04/22/21 2240 70     Resp 04/22/21 2240 18     Temp 04/22/21 2240 98.6 F (37 C)     Temp Source 04/22/21 2240 Oral     SpO2 04/22/21 2240 100 %     Weight 04/22/21 2241 210 lb (95.3 kg)     Height 04/22/21 2241 5\' 7"  (1.702 m)     Head Circumference --      Peak  Flow --      Pain Score 04/22/21 2241 0     Pain Loc --      Pain Edu? --      Excl. in [place]? --    CONSTITUTIONAL: Alert and oriented and responds appropriately to questions. Well-appearing; well-nourished HEAD: Normocephalic EYES: Conjunctivae clear, pupils appear equal, EOM appear intact ENT: normal nose; moist mucous membranes NECK: Supple, normal ROM CARD: RRR; S1 and S2 appreciated; no murmurs, no clicks, no rubs, no gallops RESP: Normal chest excursion without splinting or tachypnea; breath sounds clear and equal bilaterally; no wheezes, no rhonchi, no rales, no hypoxia or respiratory distress, speaking full sentences ABD/GI: Normal bowel sounds; non-distended; soft, non-tender, no rebound, no guarding, no peritoneal signs, no hepatosplenomegaly BACK: The back appears normal EXT: Normal ROM in all joints; no deformity noted, no edema; no cyanosis SKIN: Normal color for age and race; warm; no rash on exposed skin NEURO: Moves all extremities equally PSYCH: The patient's mood and manner are appropriate.  ____________________________________________   LABS (all labs ordered are listed, but only abnormal results are displayed)  Labs Reviewed  BASIC METABOLIC  PANEL - Abnormal; Notable for the following components:      Result Value   Glucose, Bld 108 (*)    BUN 31 (*)    Calcium 8.6 (*)    All other components within normal limits  CBC - Abnormal; Notable for the following components:   RBC 2.81 (*)    Hemoglobin 8.6 (*)    HCT 26.6 (*)    All other components within normal limits  URINALYSIS, COMPLETE (UACMP) WITH MICROSCOPIC - Abnormal; Notable for the following components:   Hgb urine dipstick TRACE (*)    Protein, ur 30 (*)    Leukocytes,Ua TRACE (*)    All other components within normal limits   ____________________________________________  EKG   ____________________________________________  RADIOLOGY I, Eleftheria Taborn, personally viewed and evaluated these images (plain radiographs) as part of my medical decision making, as well as reviewing the written report by the radiologist.  ED MD interpretation:    Official radiology report(s): No results found.  ____________________________________________   PROCEDURES  Procedure(s) performed (including Critical Care):  Procedures    ____________________________________________   INITIAL IMPRESSION / ASSESSMENT AND PLAN / ED COURSE  As part of my medical decision making, I reviewed the following data within the [place] History obtained from family, Nursing notes reviewed and incorporated, Labs reviewed , Old chart reviewed, A consult was requested and obtained from this/these consultant(s) Urology, and Notes from prior ED visits         Patient here with urinary retention.  History of prostate cancer.  Will attempt to place a coud catheter.  If unsuccessful, will attempt 67 [place].  Patient may need urology evaluation for placement as he needed this last time he was here in the hospital 04/13/2021.  Labs reassuring here.  He is anemic but this is stable.  No sign of renal failure.  ED PROGRESS  Nursing staff unable to pass 18 [place] coud catheter.   Now has some blood at the urethral meatus.  Will discuss with urology.  3:35 AM  Spoke with Dr. Bernardo Heater with urology.  He will come in to place Foley catheter at bedside.  5:30 AM  Urology came in and placed 16 french council catheter over a wire.  Appreciate urology help.  Patient has been able to drain out over 800 mL.  He reports feeling  much better.  Will discharge home with close outpatient urology follow-up.  Urine shows no sign of infection today.  At this time, I do not feel there is any life-threatening condition present. I have reviewed, interpreted and discussed all results (EKG, imaging, lab, urine as appropriate) and exam findings with patient/family. I have reviewed nursing notes and appropriate previous records.  I feel the patient is safe to be discharged home without further emergent workup and can continue workup as an outpatient as needed. Discussed usual and customary return precautions. Patient/family verbalize understanding and are comfortable with this plan.  Outpatient follow-up has been provided as needed. All questions have been answered.  ____________________________________________   FINAL CLINICAL IMPRESSION(S) / ED DIAGNOSES  Final diagnoses:  Urinary retention     ED Discharge Orders     None       *Please note:  SEBERT STOLLINGS was evaluated in Emergency Department on 04/23/2021 for the symptoms described in the history of present illness. He was evaluated in the context of the global COVID-19 pandemic, which necessitated consideration that the patient might be at risk for infection with the SARS-CoV-2 virus that causes COVID-19. Institutional protocols and algorithms that pertain to the evaluation of patients at risk for COVID-19 are in a state of rapid change based on information released by regulatory bodies including the CDC and federal and state organizations. These policies and algorithms were followed during the patient's care in the ED.  Some ED  evaluations and interventions may be delayed as a result of limited staffing during and the pandemic.*   Note:  This document was prepared using Dragon voice recognition software and may include unintentional dictation errors.    Cleatus Goodin, Delice Bison, DO 04/23/21 [id number]

## 2021-04-28 DIAGNOSIS — M9903 Segmental and somatic dysfunction of lumbar region: Secondary | ICD-10-CM | POA: Diagnosis not present

## 2021-04-28 DIAGNOSIS — M9905 Segmental and somatic dysfunction of pelvic region: Secondary | ICD-10-CM | POA: Diagnosis not present

## 2021-04-28 DIAGNOSIS — M62830 Muscle spasm of back: Secondary | ICD-10-CM | POA: Diagnosis not present

## 2021-04-28 DIAGNOSIS — M5136 Other intervertebral disc degeneration, lumbar region: Secondary | ICD-10-CM | POA: Diagnosis not present

## 2021-05-01 ENCOUNTER — Ambulatory Visit: Payer: Medicare Other | Admitting: Physician Assistant

## 2021-05-01 ENCOUNTER — Ambulatory Visit: Payer: Medicare Other | Admitting: Urology

## 2021-05-05 ENCOUNTER — Other Ambulatory Visit: Payer: Medicare Other

## 2021-05-05 ENCOUNTER — Other Ambulatory Visit: Payer: Self-pay

## 2021-05-05 DIAGNOSIS — C61 Malignant neoplasm of prostate: Secondary | ICD-10-CM | POA: Diagnosis not present

## 2021-05-06 LAB — PSA: Prostate Specific Ag, Serum: 15.1 ng/mL — ABNORMAL HIGH (ref 0.0–4.0)

## 2021-05-13 ENCOUNTER — Inpatient Hospital Stay
Admission: EM | Admit: 2021-05-13 | Discharge: 2021-05-15 | DRG: 246 | Disposition: A | Discharge status: Home / Self Care | Payer: Medicare Other | Attending: Internal Medicine | Admitting: Internal Medicine

## 2021-05-13 ENCOUNTER — Emergency Department: Payer: Medicare Other

## 2021-05-13 ENCOUNTER — Encounter
Admission: EM | Disposition: A | Discharge status: Home / Self Care | Payer: Self-pay | Source: Home / Self Care | Attending: Internal Medicine

## 2021-05-13 DIAGNOSIS — S0181XA Laceration without foreign body of other part of head, initial encounter: Secondary | ICD-10-CM | POA: Diagnosis present

## 2021-05-13 DIAGNOSIS — C61 Malignant neoplasm of prostate: Secondary | ICD-10-CM | POA: Diagnosis not present

## 2021-05-13 DIAGNOSIS — I499 Cardiac arrhythmia, unspecified: Secondary | ICD-10-CM | POA: Diagnosis not present

## 2021-05-13 DIAGNOSIS — R570 Cardiogenic shock: Secondary | ICD-10-CM | POA: Diagnosis present

## 2021-05-13 DIAGNOSIS — Z20822 Contact with and (suspected) exposure to covid-19: Secondary | ICD-10-CM | POA: Diagnosis present

## 2021-05-13 DIAGNOSIS — Z9841 Cataract extraction status, right eye: Secondary | ICD-10-CM

## 2021-05-13 DIAGNOSIS — I25112 Atherosclerotic heart disease of native coronary artery with refractory angina pectoris: Secondary | ICD-10-CM | POA: Diagnosis present

## 2021-05-13 DIAGNOSIS — N139 Obstructive and reflux uropathy, unspecified: Secondary | ICD-10-CM | POA: Diagnosis present

## 2021-05-13 DIAGNOSIS — Z87891 Personal history of nicotine dependence: Secondary | ICD-10-CM | POA: Diagnosis not present

## 2021-05-13 DIAGNOSIS — R079 Chest pain, unspecified: Secondary | ICD-10-CM | POA: Diagnosis not present

## 2021-05-13 DIAGNOSIS — I10 Essential (primary) hypertension: Secondary | ICD-10-CM | POA: Diagnosis present

## 2021-05-13 DIAGNOSIS — Z961 Presence of intraocular lens: Secondary | ICD-10-CM | POA: Diagnosis present

## 2021-05-13 DIAGNOSIS — Z8249 Family history of ischemic heart disease and other diseases of the circulatory system: Secondary | ICD-10-CM

## 2021-05-13 DIAGNOSIS — R55 Syncope and collapse: Secondary | ICD-10-CM | POA: Diagnosis present

## 2021-05-13 DIAGNOSIS — Z79899 Other long term (current) drug therapy: Secondary | ICD-10-CM | POA: Diagnosis not present

## 2021-05-13 DIAGNOSIS — W19XXXA Unspecified fall, initial encounter: Secondary | ICD-10-CM | POA: Diagnosis present

## 2021-05-13 DIAGNOSIS — E669 Obesity, unspecified: Secondary | ICD-10-CM | POA: Diagnosis present

## 2021-05-13 DIAGNOSIS — G4733 Obstructive sleep apnea (adult) (pediatric): Secondary | ICD-10-CM | POA: Diagnosis present

## 2021-05-13 DIAGNOSIS — I2119 ST elevation (STEMI) myocardial infarction involving other coronary artery of inferior wall: Secondary | ICD-10-CM | POA: Diagnosis not present

## 2021-05-13 DIAGNOSIS — Z8546 Personal history of malignant neoplasm of prostate: Secondary | ICD-10-CM

## 2021-05-13 DIAGNOSIS — I2111 ST elevation (STEMI) myocardial infarction involving right coronary artery: Secondary | ICD-10-CM | POA: Diagnosis not present

## 2021-05-13 DIAGNOSIS — R0789 Other chest pain: Secondary | ICD-10-CM | POA: Diagnosis not present

## 2021-05-13 DIAGNOSIS — I2510 Atherosclerotic heart disease of native coronary artery without angina pectoris: Secondary | ICD-10-CM | POA: Diagnosis not present

## 2021-05-13 DIAGNOSIS — D649 Anemia, unspecified: Secondary | ICD-10-CM | POA: Diagnosis present

## 2021-05-13 DIAGNOSIS — Z9842 Cataract extraction status, left eye: Secondary | ICD-10-CM

## 2021-05-13 DIAGNOSIS — Z6831 Body mass index (BMI) 31.0-31.9, adult: Secondary | ICD-10-CM

## 2021-05-13 DIAGNOSIS — I213 ST elevation (STEMI) myocardial infarction of unspecified site: Secondary | ICD-10-CM | POA: Diagnosis present

## 2021-05-13 DIAGNOSIS — Y9301 Activity, walking, marching and hiking: Secondary | ICD-10-CM | POA: Diagnosis present

## 2021-05-13 DIAGNOSIS — I4891 Unspecified atrial fibrillation: Secondary | ICD-10-CM | POA: Diagnosis not present

## 2021-05-13 DIAGNOSIS — Z923 Personal history of irradiation: Secondary | ICD-10-CM | POA: Diagnosis not present

## 2021-05-13 HISTORY — PX: LEFT HEART CATH AND CORONARY ANGIOGRAPHY: CATH118249

## 2021-05-13 HISTORY — PX: CORONARY/GRAFT ACUTE MI REVASCULARIZATION: CATH118305

## 2021-05-13 LAB — TROPONIN I (HIGH SENSITIVITY): Troponin I (High Sensitivity): 37 ng/L — ABNORMAL HIGH (ref <18)

## 2021-05-13 LAB — COMPREHENSIVE METABOLIC PANEL
ALT: 12 U/L (ref 0–44)
AST: 20 U/L (ref 15–41)
Albumin: 3.4 g/dL — ABNORMAL LOW (ref 3.5–5.0)
Alkaline Phosphatase: 51 U/L (ref 38–126)
Anion gap: 10 (ref 5–15)
BUN: 18 mg/dL (ref 8–23)
CO2: 26 mmol/L (ref 22–32)
Calcium: 9 mg/dL (ref 8.9–10.3)
Chloride: 103 mmol/L (ref 98–111)
Creatinine, Ser: 1.07 mg/dL (ref 0.61–1.24)
GFR, Estimated: >60 mL/min (ref >60)
Glucose, Bld: 125 mg/dL — ABNORMAL HIGH (ref 70–99)
Potassium: 4.3 mmol/L (ref 3.5–5.1)
Sodium: 139 mmol/L (ref 135–145)
Total Bilirubin: 0.8 mg/dL (ref 0.3–1.2)
Total Protein: 6.8 g/dL (ref 6.5–8.1)

## 2021-05-13 LAB — GLUCOSE, CAPILLARY
Glucose-Capillary: 58 mg/dL — ABNORMAL LOW (ref 70–99)
Glucose-Capillary: 166 mg/dL — ABNORMAL HIGH (ref 70–99)
Glucose-Capillary: 108 mg/dL — ABNORMAL HIGH (ref 70–99)
Glucose-Capillary: 106 mg/dL — ABNORMAL HIGH (ref 70–99)

## 2021-05-13 LAB — POCT ACTIVATED CLOTTING TIME: Activated Clotting Time: 451 seconds

## 2021-05-13 LAB — CBC WITH DIFFERENTIAL/PLATELET
Abs Immature Granulocytes: 0.03 10*3/uL (ref 0.00–0.07)
Basophils Absolute: 0 10*3/uL (ref 0.0–0.1)
Basophils Relative: 0 %
Eosinophils Absolute: 0.2 10*3/uL (ref 0.0–0.5)
Eosinophils Relative: 2 %
HCT: 24.8 % — ABNORMAL LOW (ref 39.0–52.0)
Hemoglobin: 8.3 g/dL — ABNORMAL LOW (ref 13.0–17.0)
Immature Granulocytes: 0 %
Lymphocytes Relative: 12 %
Lymphs Abs: 1.1 10*3/uL (ref 0.7–4.0)
MCH: 30.3 pg (ref 26.0–34.0)
MCHC: 33.5 g/dL (ref 30.0–36.0)
MCV: 90.5 fL (ref 80.0–100.0)
Monocytes Absolute: 0.6 10*3/uL (ref 0.1–1.0)
Monocytes Relative: 7 %
Neutro Abs: 7.1 10*3/uL (ref 1.7–7.7)
Neutrophils Relative %: 79 %
Platelets: 298 10*3/uL (ref 150–400)
RBC: 2.74 MIL/uL — ABNORMAL LOW (ref 4.22–5.81)
RDW: 15.7 % — ABNORMAL HIGH (ref 11.5–15.5)
WBC: 9 10*3/uL (ref 4.0–10.5)
nRBC: 0 % (ref 0.0–0.2)

## 2021-05-13 LAB — PROTIME-INR
INR: 1.1 (ref 0.8–1.2)
Prothrombin Time: 14.6 seconds (ref 11.4–15.2)

## 2021-05-13 LAB — RESP PANEL BY RT-PCR (FLU A&B, COVID) ARPGX2
Influenza A by PCR: NEGATIVE
Influenza B by PCR: NEGATIVE
SARS Coronavirus 2 by RT PCR: NEGATIVE

## 2021-05-13 LAB — APTT: aPTT: 32 seconds (ref 24–36)

## 2021-05-13 SURGERY — CORONARY/GRAFT ACUTE MI REVASCULARIZATION
Anesthesia: Moderate Sedation

## 2021-05-13 MED ORDER — CHLORHEXIDINE GLUCONATE CLOTH 2 % EX PADS
6.0000 | MEDICATED_PAD | Freq: Every day | CUTANEOUS | Status: DC
  Administered 2021-05-14 – 2021-05-15 (×2): 6 via TOPICAL

## 2021-05-13 MED ORDER — VERAPAMIL HCL 2.5 MG/ML IV SOLN
INTRAVENOUS | Status: AC
  Filled 2021-05-13: qty 2

## 2021-05-13 MED ORDER — LABETALOL HCL 5 MG/ML IV SOLN
10.0000 mg | INTRAVENOUS | Status: AC | PRN
Start: 2021-05-13 — End: 2021-05-13

## 2021-05-13 MED ORDER — MIDAZOLAM HCL 2 MG/2ML IJ SOLN
INTRAMUSCULAR | Status: AC
  Filled 2021-05-13: qty 2

## 2021-05-13 MED ORDER — SODIUM CHLORIDE 0.9 % IV SOLN: 250.0000 mL | INTRAVENOUS | Status: DC | PRN

## 2021-05-13 MED ORDER — HEPARIN (PORCINE) IN NACL 1000-0.9 UT/500ML-% IV SOLN
INTRAVENOUS | Status: DC | PRN
  Administered 2021-05-13: 1000 mL

## 2021-05-13 MED ORDER — DEXTROSE 50 % IV SOLN
50.0000 mL | INTRAVENOUS | Status: DC | PRN
  Administered 2021-05-13: 50 mL via INTRAVENOUS
  Filled 2021-05-13: qty 50

## 2021-05-13 MED ORDER — SODIUM CHLORIDE 0.9 % IV SOLN: INTRAVENOUS | Status: AC

## 2021-05-13 MED ORDER — BIVALIRUDIN BOLUS VIA INFUSION - CUPID
INTRAVENOUS | Status: DC | PRN
  Administered 2021-05-13: 71.475 mg via INTRAVENOUS

## 2021-05-13 MED ORDER — ENOXAPARIN SODIUM 40 MG/0.4ML IJ SOSY
40.0000 mg | PREFILLED_SYRINGE | INTRAMUSCULAR | Status: DC
  Administered 2021-05-14 – 2021-05-15 (×2): 40 mg via SUBCUTANEOUS
  Filled 2021-05-13 (×2): qty 0.4

## 2021-05-13 MED ORDER — ASPIRIN 81 MG PO CHEW
81.0000 mg | CHEWABLE_TABLET | Freq: Every day | ORAL | Status: DC
  Administered 2021-05-14 – 2021-05-15 (×2): 81 mg via ORAL
  Filled 2021-05-13 (×3): qty 1

## 2021-05-13 MED ORDER — TICAGRELOR 90 MG PO TABS
90.0000 mg | ORAL_TABLET | Freq: Two times a day (BID) | ORAL | Status: DC
  Administered 2021-05-13 – 2021-05-15 (×4): 90 mg via ORAL
  Filled 2021-05-13 (×5): qty 1

## 2021-05-13 MED ORDER — ADULT MULTIVITAMIN W/MINERALS CH
2.0000 | ORAL_TABLET | Freq: Every day | ORAL | Status: DC
  Administered 2021-05-14 – 2021-05-15 (×2): 2 via ORAL
  Filled 2021-05-13 (×3): qty 2

## 2021-05-13 MED ORDER — HEPARIN (PORCINE) IN NACL 1000-0.9 UT/500ML-% IV SOLN
INTRAVENOUS | Status: AC
  Filled 2021-05-13: qty 1000

## 2021-05-13 MED ORDER — TICAGRELOR 90 MG PO TABS
ORAL_TABLET | ORAL | Status: AC
  Filled 2021-05-13: qty 2

## 2021-05-13 MED ORDER — FENTANYL CITRATE (PF) 100 MCG/2ML IJ SOLN
INTRAMUSCULAR | Status: DC | PRN
  Administered 2021-05-13 (×2): 12.5 ug via INTRAVENOUS

## 2021-05-13 MED ORDER — VERAPAMIL HCL 2.5 MG/ML IV SOLN
INTRAVENOUS | Status: DC | PRN
  Administered 2021-05-13: 2.5 mg via INTRA_ARTERIAL

## 2021-05-13 MED ORDER — SODIUM CHLORIDE 0.9 % IV SOLN
INTRAVENOUS | Status: DC | PRN
  Administered 2021-05-13: 1.75 mg/kg/h via INTRAVENOUS

## 2021-05-13 MED ORDER — METOPROLOL SUCCINATE ER 25 MG PO TB24
12.5000 mg | ORAL_TABLET | Freq: Every day | ORAL | Status: DC
  Administered 2021-05-13 – 2021-05-15 (×3): 12.5 mg via ORAL
  Filled 2021-05-13: qty 0.5
  Filled 2021-05-13: qty 1
  Filled 2021-05-13: qty 0.5

## 2021-05-13 MED ORDER — SODIUM CHLORIDE 0.9% FLUSH
3.0000 mL | Freq: Two times a day (BID) | INTRAVENOUS | Status: DC
  Administered 2021-05-13 – 2021-05-15 (×4): 3 mL via INTRAVENOUS

## 2021-05-13 MED ORDER — LIDOCAINE HCL 1 % IJ SOLN
INTRAMUSCULAR | Status: AC
  Filled 2021-05-13: qty 20

## 2021-05-13 MED ORDER — ONDANSETRON HCL 4 MG/2ML IJ SOLN: 4.0000 mg | Freq: Four times a day (QID) | INTRAMUSCULAR | Status: DC | PRN

## 2021-05-13 MED ORDER — TICAGRELOR 90 MG PO TABS
ORAL_TABLET | ORAL | Status: DC | PRN
  Administered 2021-05-13: 180 mg via ORAL

## 2021-05-13 MED ORDER — ACETAMINOPHEN 325 MG PO TABS
650.0000 mg | ORAL_TABLET | ORAL | Status: DC | PRN
Start: 2021-05-13 — End: 2021-05-15

## 2021-05-13 MED ORDER — SODIUM CHLORIDE 0.9% FLUSH: 3.0000 mL | INTRAVENOUS | Status: DC | PRN

## 2021-05-13 MED ORDER — FENTANYL CITRATE (PF) 100 MCG/2ML IJ SOLN
INTRAMUSCULAR | Status: AC
  Filled 2021-05-13: qty 2

## 2021-05-13 MED ORDER — HYDRALAZINE HCL 20 MG/ML IJ SOLN: 10.0000 mg | INTRAMUSCULAR | Status: AC | PRN

## 2021-05-13 MED ORDER — NITROGLYCERIN 0.4 MG SL SUBL: 0.4000 mg | SUBLINGUAL_TABLET | SUBLINGUAL | Status: DC | PRN

## 2021-05-13 MED ORDER — MORPHINE SULFATE (PF) 2 MG/ML IV SOLN
1.0000 mg | INTRAVENOUS | Status: DC | PRN
Start: 2021-05-13 — End: 2021-05-15

## 2021-05-13 MED ORDER — NITROGLYCERIN 1 MG/10 ML FOR IR/CATH LAB
INTRA_ARTERIAL | Status: AC
  Filled 2021-05-13: qty 10

## 2021-05-13 MED ORDER — HEPARIN SODIUM (PORCINE) 1000 UNIT/ML IJ SOLN
INTRAMUSCULAR | Status: AC
  Filled 2021-05-13: qty 1

## 2021-05-13 MED ORDER — TAMSULOSIN HCL 0.4 MG PO CAPS
0.4000 mg | ORAL_CAPSULE | Freq: Every day | ORAL | Status: DC
  Administered 2021-05-13 – 2021-05-14 (×2): 0.4 mg via ORAL
  Filled 2021-05-13 (×2): qty 1

## 2021-05-13 MED ORDER — ATROPINE SULFATE 1 MG/10ML IJ SOSY
PREFILLED_SYRINGE | INTRAMUSCULAR | Status: AC
  Filled 2021-05-13: qty 10

## 2021-05-13 MED ORDER — MIDAZOLAM HCL 2 MG/2ML IJ SOLN
INTRAMUSCULAR | Status: DC | PRN
  Administered 2021-05-13: .5 mg via INTRAVENOUS

## 2021-05-13 MED ORDER — NITROGLYCERIN 1 MG/10 ML FOR IR/CATH LAB
INTRA_ARTERIAL | Status: DC | PRN
  Administered 2021-05-13: 200 ug via INTRACORONARY

## 2021-05-13 MED ORDER — IOHEXOL 350 MG/ML SOLN
INTRAVENOUS | Status: DC | PRN
  Administered 2021-05-13: 115 mL via INTRACARDIAC

## 2021-05-13 MED ORDER — BIVALIRUDIN TRIFLUOROACETATE 250 MG IV SOLR
INTRAVENOUS | Status: AC
  Filled 2021-05-13: qty 250

## 2021-05-13 MED ORDER — LIDOCAINE HCL (PF) 1 % IJ SOLN
INTRAMUSCULAR | Status: DC | PRN
  Administered 2021-05-13: 2 mL
  Administered 2021-05-13: 5 mL

## 2021-05-13 MED ORDER — ATORVASTATIN CALCIUM 80 MG PO TABS
80.0000 mg | ORAL_TABLET | Freq: Every day | ORAL | Status: DC
  Administered 2021-05-13 – 2021-05-15 (×3): 80 mg via ORAL
  Filled 2021-05-13 (×2): qty 4
  Filled 2021-05-13: qty 1

## 2021-05-13 MED ORDER — HEPARIN SODIUM (PORCINE) 1000 UNIT/ML IJ SOLN
INTRAMUSCULAR | Status: DC | PRN
  Administered 2021-05-13: 5000 [IU] via INTRAVENOUS

## 2021-05-13 SURGICAL SUPPLY — 25 items
BALLN TREK RX 2.5X12 (BALLOONS) ×2
BALLN ~~LOC~~ TREK RX 3.25X12 (BALLOONS) ×2
BALLOON TREK RX 2.5X12 (BALLOONS) ×1 IMPLANT
BALLOON ~~LOC~~ TREK RX 3.25X12 (BALLOONS) ×1 IMPLANT
CANNULA 5F STIFF (CANNULA) ×2 IMPLANT
CATH INFINITI 5 FR JL3.5 (CATHETERS) ×2 IMPLANT
CATH LAUNCHER 5F JR4 (CATHETERS) ×2 IMPLANT
CATH LAUNCHER 6FR AL.75 (CATHETERS) ×2 IMPLANT
CATH LAUNCHER 6FR JR4 (CATHETERS) ×2 IMPLANT
DEVICE CLOSURE MYNXGRIP 6/7F (Vascular Products) ×2 IMPLANT
DEVICE RAD TR BAND REGULAR (VASCULAR PRODUCTS) ×2 IMPLANT
DRAPE BRACHIAL (DRAPES) ×2 IMPLANT
GLIDESHEATH SLEND A-KIT 6F 22G (SHEATH) ×2 IMPLANT
GUIDEWIRE INQWIRE 1.5J.035X260 (WIRE) ×1 IMPLANT
INQWIRE 1.5J .035X260CM (WIRE) ×2
KIT ENCORE 26 ADVANTAGE (KITS) ×2 IMPLANT
PACK CARDIAC CATH (CUSTOM PROCEDURE TRAY) ×2 IMPLANT
PROTECTION STATION PRESSURIZED (MISCELLANEOUS) ×2
SET ATX SIMPLICITY (MISCELLANEOUS) ×2 IMPLANT
SHEATH AVANTI 6FR X 11CM (SHEATH) ×2 IMPLANT
STATION PROTECTION PRESSURIZED (MISCELLANEOUS) ×1 IMPLANT
STENT ONYX FRONTIER 3.0X15 (Permanent Stent) ×2 IMPLANT
WIRE GUIDERIGHT .035X150 (WIRE) ×2 IMPLANT
WIRE HITORQ VERSACORE ST 145CM (WIRE) ×2 IMPLANT
WIRE RUNTHROUGH .014X180CM (WIRE) ×2 IMPLANT

## 2021-05-13 NOTE — Telephone Encounter (Signed)
Done
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
## 2021-05-13 NOTE — ED Provider Notes (Signed)
[facility]  ____________________________________________   None    (approximate)  I have reviewed the triage vital signs and the nursing notes.   HISTORY  Chief Complaint Code STEMI (Pt "blacked out" while at [place] per pt's wife. Pt came to and began driving, had syncopal episode in parking lot of destination. Pt has lac to center of forehead with bleeding controlled. Upon EMS arrival, pt c/o chest pain and said something "felt wrong." Code stemi called. Pt A&O on arrival.)    HPI Cesar Powell is a 87 y.o. male past medical history of prostate cancer, hypertension who presents with chest pain.  Patient notes that he was walking when he suddenly felt lightheaded and fell to the ground.  He denies loss of consciousness.  He did his head.  He then developed left-sided chest pain which he describes as pressure.  Denies radiation to the back.  Denies associated nausea vomiting or dyspnea.  Pain started about 1 hour ago.         Past Medical History:  Diagnosis Date   H/O prostate cancer    was treated with radiation   Hypertension    Local recurrence of prostate cancer ([place]) 06/21/2004   Qualifier: Diagnosis of  By: Fuller Plan CMA (AAMA), [place]     Obstructive sleep apnea 06/22/2007   NPSG [place] 1979 Unattended Home sleep Study- 05/10/14- Confirms severe OSA, AHI 41.8/ hr, weight 230 pounds CPAP and also Transcend portable CPAP auto 8-15     Sleep apnea 1977   uses C-pap     Patient Active Problem List   Diagnosis Date Noted   STEMI (ST elevation myocardial infarction) ([place]) 05/13/2021   Urinary retention    Elevated troponin    Elevated brain natriuretic peptide (BNP) level    Hyponatremia    Hyperkalemia    AKI (acute kidney injury) ([place]) 04/13/2021   Goals of care, counseling/discussion 08/08/2019   Prostate cancer ([place]) 08/08/2019   Subtrochanteric fracture of femur ([place]) 10/08/2014   Essential hypertension 99/35/7017   METABOLIC  SYNDROME X 79/39/0300   Obstructive sleep apnea 06/22/2007   Local recurrence of prostate cancer ([place]) 06/22/2007   DIVERTICULITIS, HX OF 06/22/2007    Past Surgical History:  Procedure Laterality Date   ABDOMINAL SURGERY     ruptured intestines    CATARACT EXTRACTION W/PHACO Left 01/29/2016   Procedure: CATARACT EXTRACTION PHACO AND INTRAOCULAR LENS PLACEMENT ([place]) left eye;  Surgeon: Leandrew Koyanagi, MD;  Location: [place];  Service: Ophthalmology;  Laterality: Left;  RESTOR LENS   CATARACT EXTRACTION W/PHACO Right 10/21/2016   Procedure: CATARACT EXTRACTION PHACO AND INTRAOCULAR LENS PLACEMENT ([facility])  Right restor toric lens;  Surgeon: Leandrew Koyanagi, MD;  Location: [place];  Service: Ophthalmology;  Laterality: Right;  restor toric lens   CYSTOSCOPY WITH LITHOLAPAXY N/A 08/14/2016   Procedure: CYSTOSCOPY WITH LITHOLAPAXY;  Surgeon: Nickie Retort, MD;  Location: [facility];  Service: Urology;  Laterality: N/A;   FEMUR IM NAIL Right 10/08/2014   Procedure: INTRAMEDULLARY (IM) NAIL FEMORAL;  Surgeon: Rod Can, MD;  Location: [place];  Service: Orthopedics;  Laterality: Right;  PER DANIELLE 110 MIN   HOLMIUM LASER APPLICATION  04/25/3006   Procedure: HOLMIUM LASER APPLICATION;  Surgeon: Nickie Retort, MD;  Location: [facility];  Service: Urology;;   OTHER SURGICAL HISTORY  2006   Prostate Radiation Surgery    RADIOACTIVE SEED IMPLANT N/A 10/30/2019   Procedure: RADIOACTIVE SEED IMPLANT/BRACHYTHERAPY IMPLANT;  Surgeon: Diamantina Providence,  Herbert Seta, MD;  Location: [facility];  Service: Urology;  Laterality: N/A;  41 seeds implanted    Prior to Admission medications   Medication Sig Start Date End Date Taking? Authorizing Provider  ascorbic acid (VITAMIN C) 500 MG tablet Take 1,000 mg by mouth daily.    [provider]  Cholecalciferol (VITAMIN D3) 50 MCG (2000 UT) TABS Take 2,000 Units by mouth daily.    [provider]  Misc Natural Products (IMMUNE  FORMULA PO) Take 2 capsules by mouth daily. Nutriferon by [employer], Historical, MD  Misc Natural Products (JOINT HEALTH PO) Take 2 tablets by mouth daily.    [provider]  Multiple Vitamin (MULTIVITAMIN WITH MINERALS) TABS tablet Take 2 tablets by mouth daily. Vita-Lea    [provider]  Multiple Vitamins-Minerals (ZINC PO) Take 1 tablet by mouth daily.    [provider]  Nutritional Supplements (IMMUNE ENHANCE) TABS Take 1 tablet by mouth daily.    [provider]  Probiotic Product (PROBIOTIC PO) Take 1 capsule by mouth daily.    [provider]  RESVERATROL PO Take 2 capsules by mouth daily. Vivix    [provider]  tamsulosin (FLOMAX) 0.4 MG CAPS capsule Take 1 capsule (0.4 mg total) by mouth daily after supper. 04/16/21   Bonnielee Haff, MD  TURMERIC PO Take 1 capsule by mouth daily.    [provider]    Allergies Gluten meal  Family History  Problem Relation Age of Onset   Heart disease Brother    Heart attack Brother    Lung cancer Brother    Prostate cancer Neg Hx    Bladder Cancer Neg Hx    Kidney cancer Neg Hx     Social History Social History   Tobacco Use   Smoking status: Former    Packs/day: 1.00    Years: 15.00    Pack years: 15.00    Types: Cigarettes    Quit date: 09/14/1965    Years since quitting: 55.6   Smokeless tobacco: Never  Vaping Use   Vaping Use: Never used  Substance Use Topics   Alcohol use: Not Currently    Comment: occassiona;    Drug use: No    Review of Systems   Review of Systems  Constitutional:  Positive for diaphoresis. Negative for chills and fever.  Respiratory:  Positive for chest tightness. Negative for shortness of breath.   Cardiovascular:  Positive for chest pain. Negative for palpitations and leg swelling.  Gastrointestinal:  Negative for abdominal pain, diarrhea and vomiting.  All other systems reviewed and are negative.  Physical  Exam Updated Vital Signs BP (!) 66/39   Pulse (!) 47   Resp (!) 22   Ht 5\' 7"  (1.702 m)   Wt 95.3 kg   SpO2 100%   BMI 32.89 kg/m   Physical Exam Vitals and nursing note reviewed.  Constitutional:      General: He is not in acute distress.    Appearance: Normal appearance.     Comments: Patient is pale and diaphoretic appears unwell  HENT:     Head: Normocephalic and atraumatic.  Eyes:     General: No scleral icterus.    Conjunctiva/sclera: Conjunctivae normal.  Cardiovascular:     Rate and Rhythm: Bradycardia present.  Pulmonary:     Effort: Pulmonary effort is normal. No respiratory distress.     Breath sounds: Normal breath sounds. No wheezing.  Musculoskeletal:  General: No deformity or signs of injury.     Cervical back: Normal range of motion.     Right lower leg: No edema.     Left lower leg: No edema.  Skin:    Coloration: Skin is not jaundiced or pale.  Neurological:     General: No focal deficit present.     Mental Status: He is alert and oriented to person, place, and time. Mental status is at baseline.  Psychiatric:        Mood and Affect: Mood normal.        Behavior: Behavior normal.     LABS (all labs ordered are listed, but only abnormal results are displayed)  Labs Reviewed - No data to display ____________________________________________  EKG  EKG from the field showing ST elevation in the inferior leads with reciprocal depression in 1 and aVL ____________________________________________  RADIOLOGY I, Madelin Headings, personally viewed and evaluated these images (plain radiographs) as part of my medical decision making, as well as reviewing the written report by the radiologist.  ED MD interpretation:  I reviewed the CT scan of the brain which does not show any acute intracranial process-pending radiology read.     ____________________________________________   PROCEDURES  Procedure(s) performed (including Critical  Care):  Procedures   ____________________________________________   INITIAL IMPRESSION / ASSESSMENT AND PLAN / ED COURSE     Patient is an 87 year old male with prostate cancer who presents with chest pain and syncope.  Field EKG concerning for an inferior STEMI.  Cath Lab was activated from the field.  On arrival patient is pale diaphoretic.  He is hypotensive 60s over 40s and bradycardic.  She has obvious abrasion to the forehead.  Apparently he syncopized prior to developing chest pain.  Denies radiation to the back or tearing ripping nature of the pain.  Dr. Saunders Revel at bedside evaluating the patient.  Decision made to hold heparin and obtain a CT head prior to going to the Cath Lab to rule out intracranial bleed.  Discussed the possibility of aortic dissection given his syncope and inferior nature concerning for potential RCA dissection involvement, Dr. Saunders Revel felt that CT angio is not necessary given his overall clinical presentation.  Patient to CT and then Cath Lab.      ____________________________________________   FINAL CLINICAL IMPRESSION(S) / ED DIAGNOSES  Final diagnoses:  ST elevation myocardial infarction (STEMI), unspecified artery [facility])     ED Discharge Orders     None        Note:  This document was prepared using Dragon voice recognition software and may include unintentional dictation errors.    Rada Hay, MD 05/13/21 1331

## 2021-05-13 NOTE — ED Notes (Signed)
Pt to CT and cath lab with stemi coordinator at this time
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
## 2021-05-13 NOTE — Progress Notes (Signed)
Pt adm to ICU from cath lab.  Pt placed on monitor upon arrival.  Dry dressing intact to forehead.  TR band on right wrist with 83ml.  No signs of bleeding.  Pt right femoral with clean dry dressing in place soft to touch with no hematoma noted.  Pt given call bell and POC explained to pt.  Denies chest pain.

## 2021-05-13 NOTE — ED Notes (Signed)
Dr. Saunders Revel at bedside on pt arrival to ED.

## 2021-05-14 ENCOUNTER — Other Ambulatory Visit: Payer: Medicare Other | Admitting: Urology

## 2021-05-14 ENCOUNTER — Encounter: Payer: Self-pay | Admitting: Internal Medicine

## 2021-05-14 ENCOUNTER — Other Ambulatory Visit: Payer: Self-pay

## 2021-05-14 LAB — BASIC METABOLIC PANEL
Anion gap: 7 (ref 5–15)
BUN: 18 mg/dL (ref 8–23)
CO2: 25 mmol/L (ref 22–32)
Calcium: 8.7 mg/dL — ABNORMAL LOW (ref 8.9–10.3)
Chloride: 104 mmol/L (ref 98–111)
Creatinine, Ser: 0.95 mg/dL (ref 0.61–1.24)
GFR, Estimated: >60 mL/min (ref >60)
Glucose, Bld: 102 mg/dL — ABNORMAL HIGH (ref 70–99)
Potassium: 4.4 mmol/L (ref 3.5–5.1)
Sodium: 136 mmol/L (ref 135–145)

## 2021-05-14 LAB — CBC
HCT: 24.6 % — ABNORMAL LOW (ref 39.0–52.0)
Hemoglobin: 7.8 g/dL — ABNORMAL LOW (ref 13.0–17.0)
MCH: 28.7 pg (ref 26.0–34.0)
MCHC: 31.7 g/dL (ref 30.0–36.0)
MCV: 90.4 fL (ref 80.0–100.0)
Platelets: 249 10*3/uL (ref 150–400)
RBC: 2.72 MIL/uL — ABNORMAL LOW (ref 4.22–5.81)
RDW: 15.9 % — ABNORMAL HIGH (ref 11.5–15.5)
WBC: 7.6 10*3/uL (ref 4.0–10.5)
nRBC: 0 % (ref 0.0–0.2)

## 2021-05-14 LAB — LIPID PANEL
Cholesterol: 150 mg/dL (ref 0–200)
HDL: 49 mg/dL (ref >40)
LDL Cholesterol: 89 mg/dL (ref 0–99)
Total CHOL/HDL Ratio: 3.1 RATIO
Triglycerides: 58 mg/dL (ref <150)
VLDL: 12 mg/dL (ref 0–40)

## 2021-05-14 LAB — HEMOGLOBIN A1C
Hgb A1c MFr Bld: 5.7 % — ABNORMAL HIGH (ref 4.8–5.6)
Mean Plasma Glucose: 117 mg/dL

## 2021-05-14 LAB — GLUCOSE, CAPILLARY
Glucose-Capillary: 99 mg/dL (ref 70–99)
Glucose-Capillary: 96 mg/dL (ref 70–99)
Glucose-Capillary: 71 mg/dL (ref 70–99)
Glucose-Capillary: 94 mg/dL (ref 70–99)

## 2021-05-14 LAB — TSH: TSH: 6.122 u[IU]/mL — ABNORMAL HIGH (ref 0.350–4.500)

## 2021-05-14 LAB — TROPONIN I (HIGH SENSITIVITY)
Troponin I (High Sensitivity): 8145 ng/L (ref <18)
Troponin I (High Sensitivity): 14503 ng/L (ref <18)

## 2021-05-14 MED ORDER — MUPIROCIN 2 % EX OINT
TOPICAL_OINTMENT | Freq: Two times a day (BID) | CUTANEOUS | Status: DC
  Filled 2021-05-14: qty 22
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
## 2021-05-14 NOTE — Discharge Instructions (Signed)
You have received care from [facility] during this hospital stay and we look forward to continuing to provide you with excellent care in our office settings after you've left the hospital.  In order to assure a smoother transition to home following your discharge from the hospital, we will likely have you see one of our nurse practitioners or physician assistants within a few weeks of discharge.  Our advanced practice providers work closely with your physician in order to address all of your heart's needs in a timely manner.  More information about all of our providers may be found here: [email or web address]   Please plan to bring all of your prescriptions to your follow-up appointment and don't hesitate to contact us with questions or concerns.  Cesar Powell [address] [place], [place] [address] --------------------------------------  No driving for 2 weeks. No lifting over 10 lbs for 4 weeks. No sexual activity for 4 weeks. You may not return to work until cleared by your cardiologist. Keep procedure site clean & dry. If you notice increased pain, swelling, bleeding or pus, call/return!  You may shower, but no soaking baths/hot tubs/pools for 1 week.   --------------------------------------- Catheterization Site Care Refer to this sheet in the next few weeks. These instructions provide you with information on caring for yourself after your procedure. Your caregiver may also give you more specific instructions. Your treatment has been planned according to current medical practices, but problems sometimes occur. Call your caregiver if you have any problems or questions after your procedure. HOME CARE INSTRUCTIONS You may shower the day after the procedure. Remove the bandage (dressing) and gently wash the site with plain soap and water. Gently pat the site dry.  Do not  apply powder or lotion to the site.  Do not submerge the affected site in water for 3 to 5 days.  Inspect the site at least twice daily.  Do not flex or bend the affected arm for 24 hours.  No lifting over 5 pounds (2.3 kg) for 5 days after your procedure.  What to expect: Any bruising will usually fade within 1 to 2 weeks.  Blood that collects in the tissue (hematoma) may be painful to the touch. It should usually decrease in size and tenderness within 1 to 2 weeks.  SEEK IMMEDIATE MEDICAL CARE IF: You have unusual pain at the radial site.  You have redness, warmth, swelling, or pain at the radial site.  You have drainage (other than a small amount of blood on the dressing).  You have chills.  You have a fever or persistent symptoms for more than 72 hours.  You have a fever and your symptoms suddenly get worse.  Your arm becomes pale, cool, tingly, or numb.  You have heavy bleeding from the site. Hold pressure on the site.     -----------------------------     10 Habits of Highly Healthy People  [facility] wants to help you get well and stay well.  Live a longer, healthier life by practicing healthy habits every day.  1.  Visit your primary care provider regularly. 2.  Make time for family and friends.  Healthy relationships are important. 3.  Take medications as directed by your provider. 4.  Maintain a healthy weight and a trim waistline. 5.  Eat healthy meals and snacks, rich in fruits, vegetables, whole grains, and lean proteins. 6.  Get moving every day - aim for 150 minutes  of moderate physical activity each week. 7.  Don't smoke. 8.  Avoid alcohol or drink in moderation. 9.  Manage stress through meditation or mindful relaxation. 10.  Get seven to nine hours of quality sleep each night.  Want more information on healthy habits?  To learn more about these and other healthy habits, visit [employer].it. -----------------------------  Do not drive for 6 months  per DMV recommendation. 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

## 2021-05-14 NOTE — Progress Notes (Signed)
Progress Note  Patient Name: Cesar Powell Date of Encounter: 05/14/2021  Primary Cardiologist: [facility], Dr. Saunders Revel  Subjective   No chest pain or shortness of breath.  Denies any pain at his right arteriotomy site.  Eager to get up and walk, as well as be able to urinate in the bathroom.  Reviewed the need for follow-up in the outpatient setting.  Will provide information in his AVS.  He reports an allergy to a medication received in the past but uncertain of this medication.  Encouraged him to look for the name of this medication and contact the office to get it listed in his chart.  Inpatient Medications    Scheduled Meds:  aspirin  81 mg Oral Daily   atorvastatin  80 mg Oral Daily   Chlorhexidine Gluconate Cloth  6 each Topical Daily   enoxaparin (LOVENOX) injection  40 mg Subcutaneous Q24H   metoprolol succinate  12.5 mg Oral Daily   multivitamin with minerals  2 tablet Oral Daily   mupirocin ointment   Topical BID   sodium chloride flush  3 mL Intravenous Q12H   tamsulosin  0.4 mg Oral QPC supper   ticagrelor  90 mg Oral BID   Continuous Infusions:  sodium chloride     PRN Meds: sodium chloride, acetaminophen, dextrose, morphine injection, nitroGLYCERIN, ondansetron (ZOFRAN) IV, sodium chloride flush   Vital Signs    Vitals:   05/14/21 0930 05/14/21 1000 05/14/21 1100 05/14/21 1200  BP:  (!) 129/51 (!) 127/58 (!) 129/49  Pulse:  (!) 52 62 64  Resp:  10 15 17   Temp: 97.7 F (36.5 C)     TempSrc: Oral     SpO2:  99% 100% 100%  Weight:      Height:        Intake/Output Summary (Last 24 hours) at 05/14/2021 1409 Last data filed at 05/13/2021 2235 Gross per 24 hour  Intake --  Output 1150 ml  Net -1150 ml   Last 3 Weights 05/13/2021 05/13/2021 04/22/2021  Weight (lbs) 198 lb 6.6 oz 210 lb 210 lb  Weight (kg) 90 kg 95.255 kg 95.255 kg      Telemetry    NSR, PVCs 60s to 100s.  Earlier telemetry with junctional rhythm while in the emergency  department- Personally Reviewed  ECG    No new tracings- Personally Reviewed  Physical Exam   GEN: No acute distress.  Lying in bed. Neck: JVD difficult to assess due to body habitus Cardiac: RRR, no murmurs, rubs, or gallops.  Right radial arteriotomy site without pain, ecchymosis, infection, or hematoma.  Bandage clean, dry, intact. Respiratory: Trace right base crackles and reduced left base breath sounds. GI: Soft, nontender, non-distended  MS: No edema; No deformity. Neuro:  Nonfocal  Psych: Normal affect   Labs    High Sensitivity Troponin:   Recent Labs  Lab 05/13/21 1341 05/13/21 2123 05/14/21 0441  TROPONINIHS 37* 8,145* 14,503*      Chemistry Recent Labs  Lab 05/13/21 1341 05/14/21 0433  NA 139 136  K 4.3 4.4  CL 103 104  CO2 26 25  GLUCOSE 125* 102*  BUN 18 18  CREATININE 1.07 0.95  CALCIUM 9.0 8.7*  PROT 6.8  --   ALBUMIN 3.4*  --   AST 20  --   ALT 12  --   ALKPHOS 51  --   BILITOT 0.8  --   GFRNONAA >60 >60  ANIONGAP 10 7  Hematology Recent Labs  Lab 05/13/21 1341 05/14/21 0433  WBC 9.0 7.6  RBC 2.74* 2.72*  HGB 8.3* 7.8*  HCT 24.8* 24.6*  MCV 90.5 90.4  MCH 30.3 28.7  MCHC 33.5 31.7  RDW 15.7* 15.9*  PLT 298 249    BNPNo results for input(s): BNP, PROBNP in the last 168 hours.   DDimer No results for input(s): DDIMER in the last 168 hours.   Radiology    CT Head Wo Contrast  Result Date: 05/13/2021 CLINICAL DATA:  Syncope, simple, normal neuro exam EXAM: CT HEAD WITHOUT CONTRAST TECHNIQUE: Contiguous axial images were obtained from the base of the skull through the vertex without intravenous contrast. COMPARISON:  None. FINDINGS: Brain: There is no acute intracranial hemorrhage, mass effect, or edema. Gray-white differentiation is preserved. There is no extra-axial fluid collection. Patchy low-attenuation in the supratentorial white matter is nonspecific but probably reflects mild to moderate chronic microvascular ischemic  changes. There is an age-indeterminate small vessel infarct of the left corona radiata. Prominence of the ventricles and sulci reflects parenchymal volume loss. Vascular: No hyperdense vessel.There is atherosclerotic calcification at the skull base. Skull: Calvarium is unremarkable. Sinuses/Orbits: Lobular ethmoid and left maxillary sinus mucosal thickening. Bilateral lens replacements. Other: Mastoid air cells are clear.  Bilateral hearing aids. IMPRESSION: No acute intracranial abnormality. Chronic microvascular ischemic changes. Age-indeterminate but probably chronic small vessel infarct of the left corona radiata. Electronically Signed   By: Macy Mis M.D.   On: 05/13/2021 13:39   CARDIAC CATHETERIZATION  Result Date: 05/13/2021 Conclusions: Severe multivessel coronary artery disease, as detailed below.  The culprit lesion for the patient's inferior STEMI is a ruptured plaque with thrombus formation in the proximal RCA leading to 95% stenosis and distal embolization of clot. There is moderate-severe LAD and LCx disease that appears chronic. Mildly elevated left ventricular filling pressure (LVEDP 20 mmHg). Successful PCI to the proximal RCA using Onyx Frontier 3.0 x 15 mm drug-eluting stent (postdilated to 3.4 mm) with 0% residual stenosis and TIMI-3 flow. Right radial artery spasm precluding advancement of 38F guide catheter.  Consider alternative access if interventions are needed in the future. Recommendations: Dual antiplatelet therapy with aspirin and ticagrelor for at least 12 months. Favor medical management of residual LCx, LAD, and RCA disease unless the patient has refractory angina. Aggressive secondary prevention. Close monitoring of hemoglobin.  Will need to consider PRBC transfusion for symptomatic anemia or significant drops in hemoglobin, though I would ideally like to avoid transfusion given associated risk for stent thrombosis. Obtain echocardiogram. Nelva Bush, MD [facility]    Cardiac Studies   [facility] 05/13/21 Conclusions: Severe multivessel coronary artery disease, as detailed below.  The culprit lesion for the patient's inferior STEMI is a ruptured plaque with thrombus formation in the proximal RCA leading to 95% stenosis and distal embolization of clot. There is moderate-severe LAD and LCx disease that appears chronic. Mildly elevated left ventricular filling pressure (LVEDP 20 mmHg). Successful PCI to the proximal RCA using Onyx Frontier 3.0 x 15 mm drug-eluting stent (postdilated to 3.4 mm) with 0% residual stenosis and TIMI-3 flow. Right radial artery spasm precluding advancement of 38F guide catheter.  Consider alternative access if interventions are needed in the future. Recommendations: Dual antiplatelet therapy with aspirin and ticagrelor for at least 12 months. Favor medical management of residual LCx, LAD, and RCA disease unless the patient has refractory angina. Aggressive secondary prevention. Close monitoring of hemoglobin.  Will need to consider PRBC transfusion for symptomatic anemia or  significant drops in hemoglobin, though I would ideally like to avoid transfusion given associated risk for stent thrombosis. Obtain echocardiogram.  Left Heart  Left Ventricle LV end diastolic pressure is mildly elevated. LVEDP 20 mmHg.  Aortic Valve There is no aortic valve stenosis.   Coronary Diagrams  Diagnostic Dominance: Right Intervention     Patient Profile     87 y.o. male with history of hypertension, metastatic prostate cancer, obstructive sleep apnea, chronic anemia, and who is being seen after emergent catheterization for syncope with acute inferior STEMI.  Assessment & Plan    Inferior STEMI --No chest pain.  Presented s/p 2 syncopal episodes.  Underwent LHC with suspicion for acute plaque rupture type I MI, leading to occlusion of the RCA and bradycardia arrhythmia preceding syncope.  S/p PCI to the RCA.  It was noted he had some  embolization in the distal RCA, as well as chronic LAD and left circumflex disease that was noncritical. Echo ordered, pending Continue DAPT with ASA and Brilinta for up to 12 months Continue Toprol 12.5 mg daily PRN SL nitro for CP Continue atorvastatin 80mg  qd Post cath instructions to be inserted in discharge summary  Cardiogenic shock and junctional bradycardia complicated by syncope --Denies any dizziness.  Initially hypotensive and bradycardic, resolving with IV fluids and revascularization of the RCA.  It has been suspected that bradycardia and hypotension led to syncopal events.  Most recent vitals and heart rate stable.  At this time, no indication for long-term pacing or ICD as no recurrent arrhythmias identified on telemetry. Echo ordered, pending Continue Toprol Consider addition of losartan prior to discharge  Anemia --Chronic anemia with suspicion hemoglobin noted to have s/p catheterization.  PRBC recommended for hemoglobin below 8 or if symptoms, no transfusion in the setting of acute MI and stent placement was associated with increased risk of stent thrombosis.  Work-up of anemia per internal medicine.  Patient reported allergy to unknown medication --He will follow-up with his PCP to list his medication that he reports a history of poor tolerance to in the past.  He cannot recall the name of this medication at this time. Recommend this medication be listed in the EMR once identified.   For questions or updates, please contact [place] Please consult [email or web address] for contact info under        Signed, Arvil Chaco, PA-C  05/14/2021, 2:09 PM

## 2021-05-14 NOTE — Progress Notes (Signed)
Progress Note    NASHUA HOMEWOOD  ZYY:[id number] DOB: [birth year]  DOA: 05/13/2021 PCP: Owens Loffler, MD      Brief Narrative:    Medical records reviewed and are as summarized below:  Cesar Powell is a 87 y.o. male with medical history significant of hypertension, OSA, metabolic syndrome, prostate cancer (s/p of radiation therapy, has chronic Foley catheter placement due to urinary retention), chronic anemia, who presented to the hospital because of chest pain and syncope.      Assessment/Plan:   Principal Problem:   STEMI (ST elevation myocardial infarction) ([place]) Active Problems:   Obstructive sleep apnea   Prostate cancer ([place])   STEMI involving right coronary artery (HCC)   Hypertension   Syncope   Normocytic anemia    Body mass index is 31.08 kg/m.  (Obesity)  Acute inferior STEMI: S/p left heart cath with coronary stent to RCA.  Continue aspirin, Brilinta, Lipitor and metoprolol.  Follow-up with cardiologist for further recommendations.  S/p syncope and bradycardia secondary to acute inferior STEMI.  Heart rate is better.  Prostate cancer with urinary retention: He has an indwelling Foley catheter.  Continue Flomax.  OSA: Use CPAP at night  Other comorbidities include iron deficiency anemia, hypertension  Diet Order             Diet gluten free Room service appropriate? Yes; Fluid consistency: Thin  Diet effective now                      Consultants: Cardiologist  Procedures: S/p left heart cath with stent placement 10/11//2022    Medications:    aspirin  81 mg Oral Daily   atorvastatin  80 mg Oral Daily   Chlorhexidine Gluconate Cloth  6 each Topical Daily   enoxaparin (LOVENOX) injection  40 mg Subcutaneous Q24H   metoprolol succinate  12.5 mg Oral Daily   multivitamin with minerals  2 tablet Oral Daily   mupirocin ointment   Topical BID   sodium chloride flush  3 mL Intravenous Q12H   tamsulosin  0.4 mg Oral QPC  supper   ticagrelor  90 mg Oral BID   Continuous Infusions:  sodium chloride       Anti-infectives (From admission, onward)    None              Family Communication/Anticipated D/C date and plan/Code Status   DVT prophylaxis: enoxaparin (LOVENOX) injection 40 mg Start: 05/14/21 0800     Code Status: Full Code  Family Communication: None Disposition Plan:    Status is: Inpatient  Remains inpatient appropriate because:Inpatient level of care appropriate due to severity of illness  Dispo: The patient is from: Home              Anticipated d/c is to: Home              Patient currently is not medically stable to d/c.   Difficult to place patient No           Subjective:   Interval events noted.  No chest pain, palpitations or shortness of breath.  Objective:    Vitals:   05/14/21 0930 05/14/21 1000 05/14/21 1100 05/14/21 1200  BP:  (!) 129/51 (!) 127/58 (!) 129/49  Pulse:  (!) 52 62 64  Resp:  10 15 17   Temp: 97.7 F (36.5 C)     TempSrc: Oral     SpO2:  99% 100% 100%  Weight:  Height:       No data found.   Intake/Output Summary (Last 24 hours) at 05/14/2021 1316 Last data filed at 05/13/2021 2235 Gross per 24 hour  Intake --  Output 1150 ml  Net -1150 ml   Filed Weights   05/13/21 1312 05/13/21 1523  Weight: 95.3 kg 90 kg    Exam:  GEN: NAD SKIN: Abrasion on the right forehead EYES: No pallor or icterus ENT: MMM CV: RRR PULM: CTA B ABD: soft, ND, NT, +BS CNS: AAO x 3, non focal EXT: No edema or tenderness       Data Reviewed:   I have personally reviewed following labs and imaging studies:  Labs: Labs show the following:   Basic Metabolic Panel: Recent Labs  Lab 05/13/21 1341 05/14/21 0433  NA 139 136  K 4.3 4.4  CL 103 104  CO2 26 25  GLUCOSE 125* 102*  BUN 18 18  CREATININE 1.07 0.95  CALCIUM 9.0 8.7*   GFR Estimated Creatinine Clearance: 57.5 mL/min (by C-G formula based on SCr of 0.95  mg/dL). Liver Function Tests: Recent Labs  Lab 05/13/21 1341  AST 20  ALT 12  ALKPHOS 51  BILITOT 0.8  PROT 6.8  ALBUMIN 3.4*   No results for input(s): LIPASE, AMYLASE in the last 168 hours. No results for input(s): AMMONIA in the last 168 hours. Coagulation profile Recent Labs  Lab 05/13/21 1341  INR 1.1    CBC: Recent Labs  Lab 05/13/21 1341 05/14/21 0433  WBC 9.0 7.6  NEUTROABS 7.1  --   HGB 8.3* 7.8*  HCT 24.8* 24.6*  MCV 90.5 90.4  PLT 298 249   Cardiac Enzymes: No results for input(s): CKTOTAL, CKMB, CKMBINDEX, TROPONINI in the last 168 hours. BNP (last 3 results) No results for input(s): PROBNP in the last 8760 hours. CBG: Recent Labs  Lab 05/13/21 1908 05/13/21 2305 05/14/21 0256 05/14/21 0710 05/14/21 1146  GLUCAP 108* 106* 99 96 71   D-Dimer: No results for input(s): DDIMER in the last 72 hours. Hgb A1c: No results for input(s): HGBA1C in the last 72 hours. Lipid Profile: Recent Labs    05/14/21 0433  CHOL 150  HDL 49  LDLCALC 89  TRIG 58  CHOLHDL 3.1   Thyroid function studies: Recent Labs    05/14/21 0433  TSH 6.122*   Anemia work up: No results for input(s): VITAMINB12, FOLATE, FERRITIN, TIBC, IRON, RETICCTPCT in the last 72 hours. Sepsis Labs: Recent Labs  Lab 05/13/21 1341 05/14/21 0433  WBC 9.0 7.6    Microbiology Recent Results (from the past 240 hour(s))  Resp Panel by RT-PCR (Flu A&B, Covid) Nasopharyngeal Swab     Status: None   Collection Time: 05/13/21  1:42 PM   Specimen: Nasopharyngeal Swab; Nasopharyngeal(NP) swabs in vial transport medium  Result Value Ref Range Status   SARS Coronavirus 2 by RT PCR NEGATIVE NEGATIVE Final    Comment: (NOTE) SARS-CoV-2 target nucleic acids are NOT DETECTED.  The SARS-CoV-2 RNA is generally detectable in upper respiratory specimens during the acute phase of infection. The lowest concentration of SARS-CoV-2 viral copies this assay can detect is 138 copies/mL. A negative  result does not preclude SARS-Cov-2 infection and should not be used as the sole basis for treatment or other patient management decisions. A negative result may occur with  improper specimen collection/handling, submission of specimen other than nasopharyngeal swab, presence of viral mutation(s) within the areas targeted by this assay, and inadequate number of viral  copies(<138 copies/mL). A negative result must be combined with clinical observations, patient history, and epidemiological information. The expected result is Negative.  Fact Sheet for Patients:  EntrepreneurPulse.com.au  Fact Sheet for Healthcare Providers:  IncredibleEmployment.be  This test is no t yet approved or cleared by the [place] FDA and  has been authorized for detection and/or diagnosis of SARS-CoV-2 by FDA under an Emergency Use Authorization (EUA). This EUA will remain  in effect (meaning this test can be used) for the duration of the COVID-19 declaration under Section 564(b)(1) of the Act, 21 U.S.C.section 360bbb-3(b)(1), unless the authorization is terminated  or revoked sooner.       Influenza A by PCR NEGATIVE NEGATIVE Final   Influenza B by PCR NEGATIVE NEGATIVE Final    Comment: (NOTE) The Xpert Xpress SARS-CoV-2/FLU/RSV plus assay is intended as an aid in the diagnosis of influenza from Nasopharyngeal swab specimens and should not be used as a sole basis for treatment. Nasal washings and aspirates are unacceptable for Xpert Xpress SARS-CoV-2/FLU/RSV testing.  Fact Sheet for Patients: EntrepreneurPulse.com.au  Fact Sheet for Healthcare Providers: IncredibleEmployment.be  This test is not yet approved or cleared by the [place] FDA and has been authorized for detection and/or diagnosis of SARS-CoV-2 by FDA under an Emergency Use Authorization (EUA). This EUA will remain in effect (meaning this test can be used)  for the duration of the COVID-19 declaration under Section 564(b)(1) of the Act, 21 U.S.C. section 360bbb-3(b)(1), unless the authorization is terminated or revoked.  Performed at [facility], [address] [place], [place] [address]     Procedures and diagnostic studies:  CT Head Wo Contrast  Result Date: 05/13/2021 CLINICAL DATA:  Syncope, simple, normal neuro exam EXAM: CT HEAD WITHOUT CONTRAST TECHNIQUE: Contiguous axial images were obtained from the base of the skull through the vertex without intravenous contrast. COMPARISON:  None. FINDINGS: Brain: There is no acute intracranial hemorrhage, mass effect, or edema. Gray-white differentiation is preserved. There is no extra-axial fluid collection. Patchy low-attenuation in the supratentorial white matter is nonspecific but probably reflects mild to moderate chronic microvascular ischemic changes. There is an age-indeterminate small vessel infarct of the left corona radiata. Prominence of the ventricles and sulci reflects parenchymal volume loss. Vascular: No hyperdense vessel.There is atherosclerotic calcification at the skull base. Skull: Calvarium is unremarkable. Sinuses/Orbits: Lobular ethmoid and left maxillary sinus mucosal thickening. Bilateral lens replacements. Other: Mastoid air cells are clear.  Bilateral hearing aids. IMPRESSION: No acute intracranial abnormality. Chronic microvascular ischemic changes. Age-indeterminate but probably chronic small vessel infarct of the left corona radiata. Electronically Signed   By: Macy Mis M.D.   On: 05/13/2021 13:39   CARDIAC CATHETERIZATION  Result Date: 05/13/2021 Conclusions: Severe multivessel coronary artery disease, as detailed below.  The culprit lesion for the patient's inferior STEMI is a ruptured plaque with thrombus formation in the proximal RCA leading to 95% stenosis and distal embolization of clot. There is moderate-severe LAD and LCx disease that appears  chronic. Mildly elevated left ventricular filling pressure (LVEDP 20 mmHg). Successful PCI to the proximal RCA using Onyx Frontier 3.0 x 15 mm drug-eluting stent (postdilated to 3.4 mm) with 0% residual stenosis and TIMI-3 flow. Right radial artery spasm precluding advancement of 2F guide catheter.  Consider alternative access if interventions are needed in the future. Recommendations: Dual antiplatelet therapy with aspirin and ticagrelor for at least 12 months. Favor medical management of residual LCx, LAD, and RCA disease unless the patient has refractory angina.  Aggressive secondary prevention. Close monitoring of hemoglobin.  Will need to consider PRBC transfusion for symptomatic anemia or significant drops in hemoglobin, though I would ideally like to avoid transfusion given associated risk for stent thrombosis. Obtain echocardiogram. Nelva Bush, MD [facility]              LOS: 1 day   Yuvraj Pfeifer  [facility]   Pager on [email or web address]. If 7PM-7AM, please contact night-coverage at [email or web address]     05/14/2021, 1:16 PM

## 2021-05-15 ENCOUNTER — Telehealth: Payer: Self-pay | Admitting: Internal Medicine

## 2021-05-15 ENCOUNTER — Inpatient Hospital Stay (HOSPITAL_COMMUNITY)
Admit: 2021-05-15 | Discharge: 2021-05-15 | Disposition: A | Discharge status: Home / Self Care | Payer: Medicare Other | Attending: Physician Assistant | Admitting: Physician Assistant

## 2021-05-15 DIAGNOSIS — I2111 ST elevation (STEMI) myocardial infarction involving right coronary artery: Secondary | ICD-10-CM

## 2021-05-15 DIAGNOSIS — C61 Malignant neoplasm of prostate: Secondary | ICD-10-CM

## 2021-05-15 LAB — CBC
HCT: 27 % — ABNORMAL LOW (ref 39.0–52.0)
Hemoglobin: 8.4 g/dL — ABNORMAL LOW (ref 13.0–17.0)
MCH: 28.6 pg (ref 26.0–34.0)
MCHC: 31.1 g/dL (ref 30.0–36.0)
MCV: 91.8 fL (ref 80.0–100.0)
Platelets: 269 10*3/uL (ref 150–400)
RBC: 2.94 MIL/uL — ABNORMAL LOW (ref 4.22–5.81)
RDW: 15.9 % — ABNORMAL HIGH (ref 11.5–15.5)
WBC: 7 10*3/uL (ref 4.0–10.5)
nRBC: 0 % (ref 0.0–0.2)

## 2021-05-15 LAB — BASIC METABOLIC PANEL
Anion gap: 8 (ref 5–15)
BUN: 20 mg/dL (ref 8–23)
CO2: 27 mmol/L (ref 22–32)
Calcium: 8.6 mg/dL — ABNORMAL LOW (ref 8.9–10.3)
Chloride: 102 mmol/L (ref 98–111)
Creatinine, Ser: 1.16 mg/dL (ref 0.61–1.24)
GFR, Estimated: >60 mL/min (ref >60)
Glucose, Bld: 120 mg/dL — ABNORMAL HIGH (ref 70–99)
Potassium: 4.1 mmol/L (ref 3.5–5.1)
Sodium: 137 mmol/L (ref 135–145)

## 2021-05-15 LAB — ECHOCARDIOGRAM COMPLETE
AR max vel: 2.84 cm2
AV Area VTI: 2.95 cm2
AV Area mean vel: 3.03 cm2
AV Mean grad: 3 mmHg
AV Peak grad: 5.3 mmHg
Ao pk vel: 1.15 m/s
Area-P 1/2: 1.54 cm2
Height: 67 in
MV VTI: 1.04 cm2
S' Lateral: 2.6 cm
Weight: 3354.52 oz

## 2021-05-15 LAB — TROPONIN I (HIGH SENSITIVITY): Troponin I (High Sensitivity): 4804 ng/L (ref <18)

## 2021-05-15 MED ORDER — ATORVASTATIN CALCIUM 80 MG PO TABS: 80.0000 mg | ORAL_TABLET | Freq: Every day | ORAL | 0 refills | Status: DC

## 2021-05-15 MED ORDER — LOSARTAN POTASSIUM 25 MG PO TABS: 12.5000 mg | ORAL_TABLET | Freq: Every day | ORAL | Status: DC

## 2021-05-15 MED ORDER — LOSARTAN POTASSIUM 25 MG PO TABS: 12.5000 mg | ORAL_TABLET | Freq: Every day | ORAL | 0 refills | Status: DC

## 2021-05-15 MED ORDER — ASPIRIN 81 MG PO CHEW: 81.0000 mg | CHEWABLE_TABLET | Freq: Every day | ORAL | Status: AC

## 2021-05-15 MED ORDER — TICAGRELOR 90 MG PO TABS: 90.0000 mg | ORAL_TABLET | Freq: Two times a day (BID) | ORAL | 0 refills | Status: DC

## 2021-05-15 NOTE — Progress Notes (Signed)
*  PRELIMINARY RESULTS* Echocardiogram 2D Echocardiogram has been performed.  Sherrie Sport 05/15/2021, 9:53 AM

## 2021-05-15 NOTE — Progress Notes (Signed)
Progress Note  Patient Name: Cesar Powell Date of Encounter: 05/15/2021  [facility] Cardiologist: New, Dr. Saunders Revel  Subjective   Patient reports he is overall feeling better. No chest pain and breathing is good. Cath site, right wrist and groin, are stable.   Inpatient Medications    Scheduled Meds:  aspirin  81 mg Oral Daily   atorvastatin  80 mg Oral Daily   Chlorhexidine Gluconate Cloth  6 each Topical Daily   enoxaparin (LOVENOX) injection  40 mg Subcutaneous Q24H   metoprolol succinate  12.5 mg Oral Daily   multivitamin with minerals  2 tablet Oral Daily   mupirocin ointment   Topical BID   sodium chloride flush  3 mL Intravenous Q12H   tamsulosin  0.4 mg Oral QPC supper   ticagrelor  90 mg Oral BID   Continuous Infusions:  sodium chloride     PRN Meds: sodium chloride, acetaminophen, dextrose, morphine injection, nitroGLYCERIN, ondansetron (ZOFRAN) IV, sodium chloride flush   Vital Signs    Vitals:   05/14/21 1934 05/14/21 2320 05/15/21 0447 05/15/21 0750  BP: (!) 142/64 (!) 120/55 (!) 115/50 (!) 116/55  Pulse: 60 (!) 57 (!) 56 (!) 54  Resp: 13 18 18 18   Temp: 97.9 F (36.6 C) 97.7 F (36.5 C) 98.4 F (36.9 C) 98.3 F (36.8 C)  TempSrc: Oral Oral Oral Oral  SpO2: 98% 100% 99% 99%  Weight:   95.1 kg   Height:        Intake/Output Summary (Last 24 hours) at 05/15/2021 0849 Last data filed at 05/15/2021 0253 Gross per 24 hour  Intake 0 ml  Output 2600 ml  Net -2600 ml   Last 3 Weights 05/15/2021 05/13/2021 05/13/2021  Weight (lbs) 209 lb 10.5 oz 198 lb 6.6 oz 210 lb  Weight (kg) 95.1 kg 90 kg 95.255 kg      Telemetry    SR HR 40-60s, in the 40s overnight with occasional pasues less than 3 seconds. Occasional PVCs - Personally Reviewed  ECG    No new - Personally Reviewed  Physical Exam   GEN: No acute distress.   Neck: No JVD Cardiac: RRR, no murmurs, rubs, or gallops.  Respiratory: Clear to auscultation bilaterally. GI: Soft,  nontender, non-distended  MS: No edema; No deformity. Neuro:  Nonfocal  Psych: Normal affect   Labs    High Sensitivity Troponin:   Recent Labs  Lab 05/13/21 1341 05/13/21 2123 05/14/21 0441  TROPONINIHS 37* 8,145* 14,503*     Chemistry Recent Labs  Lab 05/13/21 1341 05/14/21 0433  NA 139 136  K 4.3 4.4  CL 103 104  CO2 26 25  GLUCOSE 125* 102*  BUN 18 18  CREATININE 1.07 0.95  CALCIUM 9.0 8.7*  PROT 6.8  --   ALBUMIN 3.4*  --   AST 20  --   ALT 12  --   ALKPHOS 51  --   BILITOT 0.8  --   GFRNONAA >60 >60  ANIONGAP 10 7    Lipids  Recent Labs  Lab 05/14/21 0433  CHOL 150  TRIG 58  HDL 49  LDLCALC 89  CHOLHDL 3.1    Hematology Recent Labs  Lab 05/13/21 1341 05/14/21 0433  WBC 9.0 7.6  RBC 2.74* 2.72*  HGB 8.3* 7.8*  HCT 24.8* 24.6*  MCV 90.5 90.4  MCH 30.3 28.7  MCHC 33.5 31.7  RDW 15.7* 15.9*  PLT 298 249   Thyroid  Recent Labs  Lab 05/14/21  0433  TSH 6.122*    BNPNo results for input(s): BNP, PROBNP in the last 168 hours.  DDimer No results for input(s): DDIMER in the last 168 hours.   Radiology    CT Head Wo Contrast  Result Date: 05/13/2021 CLINICAL DATA:  Syncope, simple, normal neuro exam EXAM: CT HEAD WITHOUT CONTRAST TECHNIQUE: Contiguous axial images were obtained from the base of the skull through the vertex without intravenous contrast. COMPARISON:  None. FINDINGS: Brain: There is no acute intracranial hemorrhage, mass effect, or edema. Gray-white differentiation is preserved. There is no extra-axial fluid collection. Patchy low-attenuation in the supratentorial white matter is nonspecific but probably reflects mild to moderate chronic microvascular ischemic changes. There is an age-indeterminate small vessel infarct of the left corona radiata. Prominence of the ventricles and sulci reflects parenchymal volume loss. Vascular: No hyperdense vessel.There is atherosclerotic calcification at the skull base. Skull: Calvarium is  unremarkable. Sinuses/Orbits: Lobular ethmoid and left maxillary sinus mucosal thickening. Bilateral lens replacements. Other: Mastoid air cells are clear.  Bilateral hearing aids. IMPRESSION: No acute intracranial abnormality. Chronic microvascular ischemic changes. Age-indeterminate but probably chronic small vessel infarct of the left corona radiata. Electronically Signed   By: Macy Mis M.D.   On: 05/13/2021 13:39   CARDIAC CATHETERIZATION  Result Date: 05/13/2021 Conclusions: Severe multivessel coronary artery disease, as detailed below.  The culprit lesion for the patient's inferior STEMI is a ruptured plaque with thrombus formation in the proximal RCA leading to 95% stenosis and distal embolization of clot. There is moderate-severe LAD and LCx disease that appears chronic. Mildly elevated left ventricular filling pressure (LVEDP 20 mmHg). Successful PCI to the proximal RCA using Onyx Frontier 3.0 x 15 mm drug-eluting stent (postdilated to 3.4 mm) with 0% residual stenosis and TIMI-3 flow. Right radial artery spasm precluding advancement of 93F guide catheter.  Consider alternative access if interventions are needed in the future. Recommendations: Dual antiplatelet therapy with aspirin and ticagrelor for at least 12 months. Favor medical management of residual LCx, LAD, and RCA disease unless the patient has refractory angina. Aggressive secondary prevention. Close monitoring of hemoglobin.  Will need to consider PRBC transfusion for symptomatic anemia or significant drops in hemoglobin, though I would ideally like to avoid transfusion given associated risk for stent thrombosis. Obtain echocardiogram. Nelva Bush, MD [facility]   Cardiac Studies   [facility] 05/13/21 Conclusions: Severe multivessel coronary artery disease, as detailed below.  The culprit lesion for the patient's inferior STEMI is a ruptured plaque with thrombus formation in the proximal RCA leading to 95% stenosis and distal  embolization of clot. There is moderate-severe LAD and LCx disease that appears chronic. Mildly elevated left ventricular filling pressure (LVEDP 20 mmHg). Successful PCI to the proximal RCA using Onyx Frontier 3.0 x 15 mm drug-eluting stent (postdilated to 3.4 mm) with 0% residual stenosis and TIMI-3 flow. Right radial artery spasm precluding advancement of 93F guide catheter.  Consider alternative access if interventions are needed in the future. Recommendations: Dual antiplatelet therapy with aspirin and ticagrelor for at least 12 months. Favor medical management of residual LCx, LAD, and RCA disease unless the patient has refractory angina. Aggressive secondary prevention. Close monitoring of hemoglobin.  Will need to consider PRBC transfusion for symptomatic anemia or significant drops in hemoglobin, though I would ideally like to avoid transfusion given associated risk for stent thrombosis. Obtain echocardiogram.  Left Heart   Left Ventricle LV end diastolic pressure is mildly elevated. LVEDP 20 mmHg.  Aortic Valve There is  no aortic valve stenosis.    Coronary Diagrams   Diagnostic Dominance: Right Intervention        Patient Profile     87 y.o. male with history of hypertension, metastatic prostate cancer, obstructive sleep apnea, chronic anemia, and who is being seen after emergent catheterization for syncope with acute inferior STEMI.  Assessment & Plan    Inferior STEMI - presented with chest pain and syncope found to have inf STEMI - LHC showed 90% pRCA, moderate Lcx and LAD disease, trated with DES to pRCA - DAPT with ASA and Brilinta for up to 12 months - continue Toprol 12.5mg  daily and Lipitor 80mg  daily - Monitor HR with BB - Echo results pending - cath sites stable - Hgb low post-procedure. Check CBC today  Cardiogenic shock Junctional bradycardia complicated by syncope - initially hypotensive and bradycardic improved with IVF and revascularization of the  RCA - tele shows SB with HR into the upper 40s at night with some pauses, has OSA - On Toprol, continue to monitor on tele - BP better - consider heart monitor at discharge given syncope  Chronic Anemia - Baseline 8-9 since August. Before  was 11-12 - Hgb 7.8 , may need transfusion. Will check CBC today - IM following, appreciate recs  OSA - on CPAP - Has overnight pauses on tele, all less than 3 seconds. Continue CPAP  For questions or updates, please contact [place] Please consult [email or web address] for contact info under        Signed, Joann Kulpa Ninfa Meeker, PA-C  05/15/2021, 8:49 AM

## 2021-05-15 NOTE — Discharge Summary (Signed)
Physician Discharge Summary  Cesar Powell SKA:[id number] DOB: 01-03-1931 DOA: 05/13/2021  PCP: Owens Loffler, MD  Admit date: 05/13/2021 Discharge date: 05/15/2021  Discharge disposition: Home   Recommendations for Outpatient Follow-Up:   Follow-up with PCP in 1 week Follow-up with cardiologist as scheduled   Discharge Diagnosis:   Principal Problem:   STEMI (ST elevation myocardial infarction) [facility]) Active Problems:   Obstructive sleep apnea   Prostate cancer ([place])   STEMI involving right coronary artery ([place])   Hypertension   Syncope   Normocytic anemia    Discharge Condition: Stable.  Diet recommendation:  Diet Order             Diet - low sodium heart healthy           Diet gluten free Room service appropriate? Yes; Fluid consistency: Thin  Diet effective now                     Code Status: Full Code     Hospital Course:   Cesar Powell is an 87 y.o. male with medical history significant for hypertension, OSA, metabolic syndrome, prostate cancer (s/p of radiation therapy, has chronic Foley catheter placement due to urinary retention), chronic anemia, who presented to the hospital because of chest pain and syncope.  He was found to have an acute inferior STEMI.  He was treated with IV heparin infusion.  He underwent emergent left heart catheterization and coronary stent was placed to the RCA.  His condition has improved and he is deemed stable for discharge to home.  Dual antiplatelet therapy with aspirin and Brilinta, Lipitor and metoprolol were recommended at discharge.  Discharge plan including side effects of new medications were discussed with the patient and he verbalized understanding.    Medical Consultants:   Cardiologist   Discharge Exam:    Vitals:   05/14/21 2320 05/15/21 0447 05/15/21 0750 05/15/21 1126  BP: (!) 120/55 (!) 115/50 (!) 116/55 119/61  Pulse: (!) 57 (!) 56 (!) 54 (!) 56  Resp: 18 18 18 18   Temp:  97.7 F (36.5 C) 98.4 F (36.9 C) 98.3 F (36.8 C) 98 F (36.7 C)  TempSrc: Oral Oral Oral Oral  SpO2: 100% 99% 99% 100%  Weight:  95.1 kg    Height:         GEN: NAD SKIN: Warm and dry EYES: No pallor or icterus ENT: MMM CV: RRR PULM: CTA B ABD: soft, obese, NT, +BS CNS: AAO x 3, non focal EXT: No edema or tenderness   The results of significant diagnostics from this hospitalization (including imaging, microbiology, ancillary and laboratory) are listed below for reference.     Procedures and Diagnostic Studies:   ECHOCARDIOGRAM COMPLETE  Result Date: 05/15/2021    ECHOCARDIOGRAM REPORT   Patient Name:   [name] Date of Exam: 05/15/2021 Medical Rec #:  [id number]        Height:       67.0 in Accession #:    [id number]       Weight:       209.7 lb Date of Birth:  09-18-1930        BSA:          2.063 m Patient Age:    28 years         BP:           116/55 mmHg Patient Gender: M  HR:           54 bpm. Exam Location:  [facility] Procedure: 2D Echo, Cardiac Doppler and Color Doppler Indications:     Myocardial Infarct I21.9  History:         Patient has no prior history of Echocardiogram examinations.                  Risk Factors:Hypertension and Sleep Apnea.  Sonographer:     Sherrie Sport Referring Phys:  [id number] Robin Searing VISSER Diagnosing Phys: Ida Rogue MD IMPRESSIONS  1. Left ventricular ejection fraction, by estimation, is 55 %. The left ventricle has normal function. The left ventricle has no regional wall motion abnormalities. Left ventricular diastolic parameters are consistent with Grade I diastolic dysfunction (impaired relaxation).  2. Right ventricular systolic function is normal. The right ventricular size is normal. There is normal pulmonary artery systolic pressure. The estimated right ventricular systolic pressure is 17.0 mmHg.  3. Left atrial size was moderately dilated.  4. Right atrial size was moderately dilated.  5. The mitral valve is normal in  structure. No evidence of mitral valve regurgitation. Mild mitral stenosis. FINDINGS  Left Ventricle: Left ventricular ejection fraction, by estimation, is 55 %. The left ventricle has normal function. The left ventricle has no regional wall motion abnormalities. The left ventricular internal cavity size was normal in size. There is no left ventricular hypertrophy. Left ventricular diastolic parameters are consistent with Grade I diastolic dysfunction (impaired relaxation). Right Ventricle: The right ventricular size is normal. No increase in right ventricular wall thickness. Right ventricular systolic function is normal. There is normal pulmonary artery systolic pressure. The tricuspid regurgitant velocity is 2.09 m/s, and  with an assumed right atrial pressure of 5 mmHg, the estimated right ventricular systolic pressure is 01.7 mmHg. Left Atrium: Left atrial size was moderately dilated. Right Atrium: Right atrial size was moderately dilated. Pericardium: There is no evidence of pericardial effusion. Mitral Valve: The mitral valve is normal in structure. Mild mitral annular calcification. No evidence of mitral valve regurgitation. Mild mitral valve stenosis. MV peak gradient, 12.4 mmHg. The mean mitral valve gradient is 5.0 mmHg. Tricuspid Valve: The tricuspid valve is normal in structure. Tricuspid valve regurgitation is not demonstrated. No evidence of tricuspid stenosis. Aortic Valve: The aortic valve was not well visualized. Aortic valve regurgitation is not visualized. Mild to moderate aortic valve sclerosis/calcification is present, without any evidence of aortic stenosis. Aortic valve mean gradient measures 3.0 mmHg.  Aortic valve peak gradient measures 5.3 mmHg. Aortic valve area, by VTI measures 2.95 cm. Pulmonic Valve: The pulmonic valve was normal in structure. Pulmonic valve regurgitation is not visualized. No evidence of pulmonic stenosis. Aorta: The aortic root is normal in size and structure.  Venous: The inferior vena cava is normal in size with greater than 50% respiratory variability, suggesting right atrial pressure of 3 mmHg. IAS/Shunts: No atrial level shunt detected by color flow Doppler.  LEFT VENTRICLE PLAX 2D LVIDd:         3.70 cm   Diastology LVIDs:         2.60 cm   LV e' medial:    4.90 cm/s LV PW:         1.30 cm   LV E/e' medial:  25.9 LV IVS:        0.85 cm   LV e' lateral:   5.33 cm/s LVOT diam:     2.10 cm   LV E/e' lateral: 23.8 LV SV:  73 LV SV Index:   35 LVOT Area:     3.46 cm  RIGHT VENTRICLE RV Basal diam:  3.80 cm RV S prime:     13.55 cm/s TAPSE (M-mode): 4.9 cm LEFT ATRIUM            Index        RIGHT ATRIUM           Index LA diam:      4.90 cm  2.37 cm/m   RA Area:     32.90 cm LA Vol (A2C): 93.8 ml  45.46 ml/m  RA Volume:   116.00 ml 56.22 ml/m LA Vol (A4C): 137.0 ml 66.40 ml/m  AORTIC VALVE                    PULMONIC VALVE AV Area (Vmax):    2.84 cm     PV Vmax:        0.94 m/s AV Area (Vmean):   3.03 cm     PV Peak grad:   3.6 mmHg AV Area (VTI):     2.95 cm     RVOT Peak grad: 4 mmHg AV Vmax:           115.00 cm/s AV Vmean:          83.800 cm/s AV VTI:            0.248 m AV Peak Grad:      5.3 mmHg AV Mean Grad:      3.0 mmHg LVOT Vmax:         94.20 cm/s LVOT Vmean:        73.300 cm/s LVOT VTI:          0.211 m LVOT/AV VTI ratio: 0.85  AORTA Ao Root diam: 3.10 cm MITRAL VALVE                TRICUSPID VALVE MV Area (PHT): 1.54 cm     TR Peak grad:   17.5 mmHg MV Area VTI:   1.04 cm     TR Vmax:        209.00 cm/s MV Peak grad:  12.4 mmHg MV Mean grad:  5.0 mmHg     SHUNTS MV Vmax:       1.76 m/s     Systemic VTI:  0.21 m MV Vmean:      108.0 cm/s   Systemic Diam: 2.10 cm MV Decel Time: 492 msec MV E velocity: 127.00 cm/s MV A velocity: 163.00 cm/s MV E/A ratio:  0.78 Ida Rogue MD Electronically signed by Ida Rogue MD Signature Date/Time: 05/15/2021/1:06:02 PM    Final      Labs:   Basic Metabolic Panel: Recent Labs  Lab 05/13/21 1341  05/14/21 0433 05/15/21 0923  NA 139 136 137  K 4.3 4.4 4.1  CL 103 104 102  CO2 26 25 27   GLUCOSE 125* 102* 120*  BUN 18 18 20   CREATININE 1.07 0.95 1.16  CALCIUM 9.0 8.7* 8.6*   GFR Estimated Creatinine Clearance: 48.4 mL/min (by C-G formula based on SCr of 1.16 mg/dL). Liver Function Tests: Recent Labs  Lab 05/13/21 1341  AST 20  ALT 12  ALKPHOS 51  BILITOT 0.8  PROT 6.8  ALBUMIN 3.4*   No results for input(s): LIPASE, AMYLASE in the last 168 hours. No results for input(s): AMMONIA in the last 168 hours. Coagulation profile Recent Labs  Lab 05/13/21 1341  INR 1.1    CBC: Recent Labs  Lab 05/13/21 1341 05/14/21 0433 05/15/21 0923  WBC 9.0 7.6 7.0  NEUTROABS 7.1  --   --   HGB 8.3* 7.8* 8.4*  HCT 24.8* 24.6* 27.0*  MCV 90.5 90.4 91.8  PLT 298 249 269   Cardiac Enzymes: No results for input(s): CKTOTAL, CKMB, CKMBINDEX, TROPONINI in the last 168 hours. BNP: Invalid input(s): POCBNP CBG: Recent Labs  Lab 05/13/21 2305 05/14/21 0256 05/14/21 0710 05/14/21 1146 05/14/21 1517  GLUCAP 106* 99 96 71 94   D-Dimer No results for input(s): DDIMER in the last 72 hours. Hgb A1c Recent Labs    05/14/21 0433  HGBA1C 5.7*   Lipid Profile Recent Labs    05/14/21 0433  CHOL 150  HDL 49  LDLCALC 89  TRIG 58  CHOLHDL 3.1   Thyroid function studies Recent Labs    05/14/21 0433  TSH 6.122*   Anemia work up No results for input(s): VITAMINB12, FOLATE, FERRITIN, TIBC, IRON, RETICCTPCT in the last 72 hours. Microbiology Recent Results (from the past 240 hour(s))  Resp Panel by RT-PCR (Flu A&B, Covid) Nasopharyngeal Swab     Status: None   Collection Time: 05/13/21  1:42 PM   Specimen: Nasopharyngeal Swab; Nasopharyngeal(NP) swabs in vial transport medium  Result Value Ref Range Status   SARS Coronavirus 2 by RT PCR NEGATIVE NEGATIVE Final    Comment: (NOTE) SARS-CoV-2 target nucleic acids are NOT DETECTED.  The SARS-CoV-2 RNA is generally  detectable in upper respiratory specimens during the acute phase of infection. The lowest concentration of SARS-CoV-2 viral copies this assay can detect is 138 copies/mL. A negative result does not preclude SARS-Cov-2 infection and should not be used as the sole basis for treatment or other patient management decisions. A negative result may occur with  improper specimen collection/handling, submission of specimen other than nasopharyngeal swab, presence of viral mutation(s) within the areas targeted by this assay, and inadequate number of viral copies(<138 copies/mL). A negative result must be combined with clinical observations, patient history, and epidemiological information. The expected result is Negative.  Fact Sheet for Patients:  EntrepreneurPulse.com.au  Fact Sheet for Healthcare Providers:  IncredibleEmployment.be  This test is no t yet approved or cleared by the [place] FDA and  has been authorized for detection and/or diagnosis of SARS-CoV-2 by FDA under an Emergency Use Authorization (EUA). This EUA will remain  in effect (meaning this test can be used) for the duration of the COVID-19 declaration under Section 564(b)(1) of the Act, 21 U.S.C.section 360bbb-3(b)(1), unless the authorization is terminated  or revoked sooner.       Influenza A by PCR NEGATIVE NEGATIVE Final   Influenza B by PCR NEGATIVE NEGATIVE Final    Comment: (NOTE) The Xpert Xpress SARS-CoV-2/FLU/RSV plus assay is intended as an aid in the diagnosis of influenza from Nasopharyngeal swab specimens and should not be used as a sole basis for treatment. Nasal washings and aspirates are unacceptable for Xpert Xpress SARS-CoV-2/FLU/RSV testing.  Fact Sheet for Patients: EntrepreneurPulse.com.au  Fact Sheet for Healthcare Providers: IncredibleEmployment.be  This test is not yet approved or cleared by the [place] FDA  and has been authorized for detection and/or diagnosis of SARS-CoV-2 by FDA under an Emergency Use Authorization (EUA). This EUA will remain in effect (meaning this test can be used) for the duration of the COVID-19 declaration under Section 564(b)(1) of the Act, 21 U.S.C. section 360bbb-3(b)(1), unless the authorization is terminated or revoked.  Performed at [facility], [place],  [place], [place] [address]      Discharge Instructions:   Discharge Instructions     AMB Referral to Cardiac Rehabilitation - Phase II   Complete by: As directed    Diagnosis:  Coronary Stents STEMI     After initial evaluation and assessments completed: Virtual Based Care may be provided alone or in conjunction with Phase 2 Cardiac Rehab based on patient barriers.: Yes   Diet - low sodium heart healthy   Complete by: As directed    Discharge instructions   Complete by: As directed    Do not drive any motor vehicle until instructed by your physician or cardiologist to do so.   Increase activity slowly   Complete by: As directed       Allergies as of 05/15/2021       Reactions   Gluten Meal Rash        Medication List     STOP taking these medications    Immune Enhance Tabs   IMMUNE FORMULA PO   JOINT HEALTH PO   multivitamin with minerals Tabs tablet   TURMERIC PO       TAKE these medications    ascorbic acid 500 MG tablet Commonly known as: VITAMIN C Take 1,000 mg by mouth daily.   aspirin 81 MG chewable tablet Chew 1 tablet (81 mg total) by mouth daily. Start taking on: May 16, 2021   atorvastatin 80 MG tablet Commonly known as: LIPITOR Take 1 tablet (80 mg total) by mouth daily. Start taking on: May 16, 2021   losartan 25 MG tablet Commonly known as: COZAAR Take 0.5 tablets (12.5 mg total) by mouth daily. Start taking on: May 16, 2021   PROBIOTIC PO Take 1 capsule by mouth daily.   RESVERATROL PO Take 2 capsules by mouth  daily. Vivix   tamsulosin 0.4 MG Caps capsule Commonly known as: FLOMAX Take 1 capsule (0.4 mg total) by mouth daily after supper.   ticagrelor 90 MG Tabs tablet Commonly known as: BRILINTA Take 1 tablet (90 mg total) by mouth 2 (two) times daily.   Vitamin D3 50 MCG (2000 UT) Tabs Take 2,000 Units by mouth daily.   ZINC PO Take 1 tablet by mouth daily.           If you experience worsening of your admission symptoms, develop shortness of breath, life threatening emergency, suicidal or homicidal thoughts you must seek medical attention immediately by calling 911 or calling your MD immediately  if symptoms less severe.   You must read complete instructions/literature along with all the possible adverse reactions/side effects for all the medicines you take and that have been prescribed to you. Take any new medicines after you have completely understood and accept all the possible adverse reactions/side effects.    Please note   You were cared for by a hospitalist during your hospital stay. If you have any questions about your discharge medications or the care you received while you were in the hospital after you are discharged, you can call the unit and asked to speak with the hospitalist on call if the hospitalist that took care of you is not available. Once you are discharged, your primary care physician will handle any further medical issues. Please note that NO REFILLS for any discharge medications will be authorized once you are discharged, as it is imperative that you return to your primary care physician (or establish a relationship with a primary care physician if you do not have one)  for your aftercare needs so that they can reassess your need for medications and monitor your lab values.       Time coordinating discharge: 31 minues  Signed:  [place] [facility] 05/15/2021, 1:59 PM   Pager on [email or web address]. If 7PM-7AM, please contact night-coverage at  [email or web address]

## 2021-05-15 NOTE — Telephone Encounter (Signed)
-----   Message from [place], PA-C sent at 05/15/2021  2:44 PM EDT ----- Regarding: hospital follow-up Pt needs 2-3 weeks hospital follow-up. Thx!

## 2021-05-15 NOTE — Telephone Encounter (Signed)
LMOV  

## 2021-05-15 NOTE — TOC Initial Note (Signed)
Transition of Care [facility]) - Initial/Assessment Note    Patient Details  Name: Cesar Powell MRN: [id number] Date of Birth: [birth year]  [facility] CM/SW Contact:    Cesar Sessions, RN Phone Number: 05/15/2021, 2:42 PM  Clinical Narrative:                 Provided 30 day brlinta coupon for discharge   Patient was assessed by [facility] 9/12 See note below  " Spoke with patient, who is alert and oriented x4, son Cesar Powell at bedside. Patients lives with spouse, independent with ADL's, drives and able to attend medical appointments, shopping and cooking. Uses C-Pap at night sometimes. Uses [facility] [place]. Denies use of [facility] services, however if needed will consent. Denies use of assisted device for ambulation. No TOC barriers identified, will continue to track."                       Patient Goals and CMS Choice        Expected Discharge Plan and Services           Expected Discharge Date: 05/15/21                                    Prior Living Arrangements/Services                       Activities of Daily Living Home Assistive Devices/Equipment: Eyeglasses ADL Screening (condition at time of admission) Patient's cognitive ability adequate to safely complete daily activities?: Yes Is the patient deaf or have difficulty hearing?: No Does the patient have difficulty seeing, even when wearing glasses/contacts?: No Does the patient have difficulty concentrating, remembering, or making decisions?: No Patient able to express need for assistance with ADLs?: Yes Does the patient have difficulty dressing or bathing?: No Independently performs ADLs?: Yes (appropriate for developmental age) Does the patient have difficulty walking or climbing stairs?: No Weakness of Legs: None Weakness of Arms/Hands: None  Permission Sought/Granted                  Emotional Assessment              Admission diagnosis:  STEMI involving right coronary  artery ([place]) [I21.11] Patient Active Problem List   Diagnosis Date Noted   STEMI (ST elevation myocardial infarction) ([place]) 05/13/2021   STEMI involving right coronary artery ([place]) 05/13/2021   Hypertension    Syncope    Normocytic anemia    Urinary retention    Elevated troponin    Elevated brain natriuretic peptide (BNP) level    Hyponatremia    Hyperkalemia    AKI (acute kidney injury) ([place]) 04/13/2021   Goals of care, counseling/discussion 08/08/2019   Prostate cancer ([place]) 08/08/2019   Subtrochanteric fracture of femur ([place]) 10/08/2014   Essential hypertension 41/32/4401   METABOLIC SYNDROME X 02/72/5366   Obstructive sleep apnea 06/22/2007   Local recurrence of prostate cancer ([place]) 06/22/2007   DIVERTICULITIS, HX OF 06/22/2007   PCP:  Cesar Loffler, MD Pharmacy:   [id number] [place], [place] [address] Phone: [phone] Fax: [phone]     Social Determinants of Health (SDOH) Interventions    Readmission Risk Interventions Readmission Risk Prevention Plan 04/14/2021  Transportation Screening Complete  PCP or Specialist Appt within 5-7 Days Complete  Home Care Screening  Complete  Medication Review (RN CM) Complete  Some recent data might be hidden

## 2021-05-15 NOTE — Progress Notes (Signed)
Follow up with this patient who I met from ED. Patient is doing well and expecting discharge today. Asked nurse to step in to relay the discharge plans.

## 2021-05-19 ENCOUNTER — Emergency Department
Admission: EM | Admit: 2021-05-19 | Discharge: 2021-05-19 | Disposition: A | Discharge status: Home / Self Care | Payer: Medicare Other | Attending: Emergency Medicine | Admitting: Emergency Medicine

## 2021-05-19 ENCOUNTER — Encounter: Payer: Self-pay | Admitting: Radiology

## 2021-05-19 ENCOUNTER — Other Ambulatory Visit: Payer: Self-pay

## 2021-05-19 ENCOUNTER — Emergency Department: Payer: Medicare Other

## 2021-05-19 DIAGNOSIS — R001 Bradycardia, unspecified: Secondary | ICD-10-CM | POA: Diagnosis not present

## 2021-05-19 DIAGNOSIS — R079 Chest pain, unspecified: Secondary | ICD-10-CM | POA: Diagnosis not present

## 2021-05-19 DIAGNOSIS — R0789 Other chest pain: Secondary | ICD-10-CM | POA: Diagnosis not present

## 2021-05-19 DIAGNOSIS — Z8546 Personal history of malignant neoplasm of prostate: Secondary | ICD-10-CM | POA: Diagnosis not present

## 2021-05-19 DIAGNOSIS — R072 Precordial pain: Secondary | ICD-10-CM | POA: Insufficient documentation

## 2021-05-19 DIAGNOSIS — I10 Essential (primary) hypertension: Secondary | ICD-10-CM | POA: Insufficient documentation

## 2021-05-19 DIAGNOSIS — Z87891 Personal history of nicotine dependence: Secondary | ICD-10-CM | POA: Diagnosis not present

## 2021-05-19 DIAGNOSIS — Z7982 Long term (current) use of aspirin: Secondary | ICD-10-CM | POA: Insufficient documentation

## 2021-05-19 DIAGNOSIS — Z20822 Contact with and (suspected) exposure to covid-19: Secondary | ICD-10-CM | POA: Diagnosis not present

## 2021-05-19 DIAGNOSIS — Z79899 Other long term (current) drug therapy: Secondary | ICD-10-CM | POA: Insufficient documentation

## 2021-05-19 LAB — RESP PANEL BY RT-PCR (FLU A&B, COVID) ARPGX2
Influenza A by PCR: NEGATIVE
Influenza B by PCR: NEGATIVE
SARS Coronavirus 2 by RT PCR: NEGATIVE

## 2021-05-19 LAB — BASIC METABOLIC PANEL
Anion gap: 10 (ref 5–15)
BUN: 23 mg/dL (ref 8–23)
CO2: 24 mmol/L (ref 22–32)
Calcium: 8.7 mg/dL — ABNORMAL LOW (ref 8.9–10.3)
Chloride: 101 mmol/L (ref 98–111)
Creatinine, Ser: 0.98 mg/dL (ref 0.61–1.24)
GFR, Estimated: >60 mL/min (ref >60)
Glucose, Bld: 103 mg/dL — ABNORMAL HIGH (ref 70–99)
Potassium: 4.2 mmol/L (ref 3.5–5.1)
Sodium: 135 mmol/L (ref 135–145)

## 2021-05-19 LAB — CBC
HCT: 28.3 % — ABNORMAL LOW (ref 39.0–52.0)
Hemoglobin: 9 g/dL — ABNORMAL LOW (ref 13.0–17.0)
MCH: 29.1 pg (ref 26.0–34.0)
MCHC: 31.8 g/dL (ref 30.0–36.0)
MCV: 91.6 fL (ref 80.0–100.0)
Platelets: 299 10*3/uL (ref 150–400)
RBC: 3.09 MIL/uL — ABNORMAL LOW (ref 4.22–5.81)
RDW: 15.9 % — ABNORMAL HIGH (ref 11.5–15.5)
WBC: 7.3 10*3/uL (ref 4.0–10.5)
nRBC: 0 % (ref 0.0–0.2)

## 2021-05-19 LAB — TROPONIN I (HIGH SENSITIVITY)
Troponin I (High Sensitivity): 250 ng/L (ref <18)
Troponin I (High Sensitivity): 203 ng/L (ref <18)

## 2021-05-19 MED ORDER — IOHEXOL 350 MG/ML SOLN
75.0000 mL | Freq: Once | INTRAVENOUS | Status: AC | PRN
  Administered 2021-05-19: 75 mL via INTRAVENOUS

## 2021-05-19 NOTE — ED Notes (Signed)
Pt evaluated and d/c by Dr Jacqualine Code in triage

## 2021-05-19 NOTE — ED Provider Notes (Signed)
Emergency Medicine Provider Triage Evaluation Note  Cesar Powell , a 87 y.o. male  was evaluated in triage.  Pt complains of chest pain. Stent placed last week after MI. He started a new medication and wonders if symptoms are related.    Review of Systems  Positive: Chest pain/tightness Negative: Shortness of breath  Physical Exam  There were no vitals taken for this visit. Gen:   Awake, no distress   Resp:  Normal effort  MSK:   Moves extremities without difficulty  Other:    Medical Decision Making  Medically screening exam initiated at 2:55 PM.  Appropriate orders placed.  Cesar Powell was informed that the remainder of the evaluation will be completed by another provider, this initial triage assessment does not replace that evaluation, and the importance of remaining in the ED until their evaluation is complete.    Victorino Dike, FNP 05/19/21 1456    Rada Hay, MD 05/19/21 1810

## 2021-05-19 NOTE — ED Provider Notes (Signed)
[facility] Emergency Department Provider Note   ____________________________________________   Event Date/Time   First MD Initiated Contact with Patient 05/19/21 [phone]     (approximate)  I have reviewed the triage vital signs and the nursing notes.   HISTORY  Chief Complaint Chest Pain    HPI Cesar Powell is a 87 y.o. male here for chest pain  STEMI 1 week ago  Today woke up had chest pain starting in his shoulders now rating towards substernal region.  Reports mild discomfort behind the sternum now but was fairly severe improved after aspirin and nitroglycerin with EMS  Denies fevers chills or recent illness.  Compliant with his medications.  No abdominal pain no vomiting no shortness of breath Past Medical History:  Diagnosis Date   H/O prostate cancer    was treated with radiation   Hypertension    Local recurrence of prostate cancer ([place]) 06/21/2004   Qualifier: Diagnosis of  By: Fuller Plan CMA (AAMA), [place]     Obstructive sleep apnea 06/22/2007   NPSG [place] 1979 Unattended Home sleep Study- 05/10/14- Confirms severe OSA, AHI 41.8/ hr, weight 230 pounds CPAP and also Transcend portable CPAP auto 8-15     Sleep apnea 1977   uses C-pap     Patient Active Problem List   Diagnosis Date Noted   STEMI (ST elevation myocardial infarction) ([place]) 05/13/2021   STEMI involving right coronary artery ([place]) 05/13/2021   Hypertension    Syncope    Normocytic anemia    Urinary retention    Elevated troponin    Elevated brain natriuretic peptide (BNP) level    Hyponatremia    Hyperkalemia    AKI (acute kidney injury) ([place]) 04/13/2021   Goals of care, counseling/discussion 08/08/2019   Prostate cancer ([place]) 08/08/2019   Subtrochanteric fracture of femur ([place]) 10/08/2014   Essential hypertension 86/76/7209   METABOLIC SYNDROME X 47/04/6282   Obstructive sleep apnea 06/22/2007   Local recurrence of prostate cancer ([place]) 06/22/2007    DIVERTICULITIS, HX OF 06/22/2007    Past Surgical History:  Procedure Laterality Date   ABDOMINAL SURGERY     ruptured intestines    CATARACT EXTRACTION W/PHACO Left 01/29/2016   Procedure: CATARACT EXTRACTION PHACO AND INTRAOCULAR LENS PLACEMENT ([place]) left eye;  Surgeon: Leandrew Koyanagi, MD;  Location: [place];  Service: Ophthalmology;  Laterality: Left;  RESTOR LENS   CATARACT EXTRACTION W/PHACO Right 10/21/2016   Procedure: CATARACT EXTRACTION PHACO AND INTRAOCULAR LENS PLACEMENT ([facility])  Right restor toric lens;  Surgeon: Leandrew Koyanagi, MD;  Location: [place];  Service: Ophthalmology;  Laterality: Right;  restor toric lens   CORONARY/GRAFT ACUTE MI REVASCULARIZATION N/A 05/13/2021   Procedure: Coronary/Graft Acute MI Revascularization;  Surgeon: Nelva Bush, MD;  Location: [place] [facility];  Service: Cardiovascular;  Laterality: N/A;   CYSTOSCOPY WITH LITHOLAPAXY N/A 08/14/2016   Procedure: CYSTOSCOPY WITH LITHOLAPAXY;  Surgeon: Nickie Retort, MD;  Location: [facility];  Service: Urology;  Laterality: N/A;   FEMUR IM NAIL Right 10/08/2014   Procedure: INTRAMEDULLARY (IM) NAIL FEMORAL;  Surgeon: Rod Can, MD;  Location: [place];  Service: Orthopedics;  Laterality: Right;  PER DANIELLE 110 MIN   HOLMIUM LASER APPLICATION  6/62/9476   Procedure: HOLMIUM LASER APPLICATION;  Surgeon: Nickie Retort, MD;  Location: [facility];  Service: Urology;;   LEFT HEART CATH AND CORONARY ANGIOGRAPHY N/A 05/13/2021   Procedure: LEFT HEART CATH AND CORONARY ANGIOGRAPHY;  Surgeon: Nelva Bush, MD;  Location: [place] [facility];  Service: Cardiovascular;  Laterality: N/A;   OTHER SURGICAL HISTORY  2006   Prostate Radiation Surgery    RADIOACTIVE SEED IMPLANT N/A 10/30/2019   Procedure: RADIOACTIVE SEED IMPLANT/BRACHYTHERAPY IMPLANT;  Surgeon: Billey Co, MD;  Location: [facility];  Service: Urology;  Laterality: N/A;  41 seeds implanted    Prior to  Admission medications   Medication Sig Start Date End Date Taking? Authorizing Provider  ascorbic acid (VITAMIN C) 500 MG tablet Take 1,000 mg by mouth daily.    [provider]  aspirin 81 MG chewable tablet Chew 1 tablet (81 mg total) by mouth daily. 05/16/21 05/16/22  Jennye Boroughs, MD  atorvastatin (LIPITOR) 80 MG tablet Take 1 tablet (80 mg total) by mouth daily. 05/16/21   Jennye Boroughs, MD  Cholecalciferol (VITAMIN D3) 50 MCG (2000 UT) TABS Take 2,000 Units by mouth daily.    [provider]  losartan (COZAAR) 25 MG tablet Take 0.5 tablets (12.5 mg total) by mouth daily. 05/16/21   Jennye Boroughs, MD  Multiple Vitamins-Minerals (ZINC PO) Take 1 tablet by mouth daily.    [provider]  Probiotic Product (PROBIOTIC PO) Take 1 capsule by mouth daily.    [provider]  RESVERATROL PO Take 2 capsules by mouth daily. Vivix    [provider]  tamsulosin (FLOMAX) 0.4 MG CAPS capsule Take 1 capsule (0.4 mg total) by mouth daily after supper. 04/16/21   Bonnielee Haff, MD  ticagrelor (BRILINTA) 90 MG TABS tablet Take 1 tablet (90 mg total) by mouth 2 (two) times daily. 05/15/21   Jennye Boroughs, MD    Allergies Gluten meal  Family History  Problem Relation Age of Onset   Heart disease Brother    Heart attack Brother    Lung cancer Brother    Prostate cancer Neg Hx    Bladder Cancer Neg Hx    Kidney cancer Neg Hx     Social History Social History   Tobacco Use   Smoking status: Former    Packs/day: 1.00    Years: 15.00    Pack years: 15.00    Types: Cigarettes    Quit date: 09/14/1965    Years since quitting: 55.7   Smokeless tobacco: Never  Vaping Use   Vaping Use: Never used  Substance Use Topics   Alcohol use: Not Currently    Comment: occassiona;    Drug use: No    Review of Systems Constitutional: No fever/chills Eyes: No visual changes. ENT: No sore throat. Cardiovascular: See HPI Respiratory: Denies shortness of  breath. Gastrointestinal: No abdominal pain.   Musculoskeletal: Negative for back pain. Skin: Negative for rash. Neurological: Negative for headaches.  Felt a bit "lightheaded" earlier while the pain was more severe    ____________________________________________   PHYSICAL EXAM:  VITAL SIGNS: ED Triage Vitals  Enc Vitals Group     BP 05/19/21 1459 (!) 134/48     Pulse Rate 05/19/21 1459 (!) 58     Resp 05/19/21 1459 16     Temp 05/19/21 1459 98 F (36.7 C)     Temp Source 05/19/21 1459 Oral     SpO2 05/19/21 1459 100 %     Weight 05/19/21 1500 199 lb (90.3 kg)     Height 05/19/21 1500 5\' 7"  (1.702 m)     Head Circumference --      Peak Flow --      Pain Score 05/19/21 1459 2  Pain Loc --      Pain Edu? --      Excl. in [place]? --     Constitutional: Alert and oriented. Well appearing and in no acute distress.  Seated up in wheelchair.  Currently in subway region of the ER due to severe capacity issues Eyes: Conjunctivae are normal. Head: Atraumatic. Nose: No congestion/rhinnorhea. Mouth/Throat: Mucous membranes are moist. Neck: No stridor.  Cardiovascular: Normal rate, regular rhythm. Grossly normal heart sounds.  Good peripheral circulation. Respiratory: Normal respiratory effort.  No retractions. Lungs CTAB. Gastrointestinal: Soft and nontender. No distention. Musculoskeletal: No lower extremity tenderness mild edema of the lower legs bilateral.  Of note patient reports he has a history of edema in the leg since actually getting quite a bit better than it used to be.  No thigh or calf tenderness Neurologic:  Normal speech and language. No gross focal neurologic deficits are appreciated.  Skin:  Skin is warm, dry and intact. No rash noted. Psychiatric: Mood and affect are normal. Speech and behavior are normal.  ____________________________________________   LABS (all labs ordered are listed, but only abnormal results are displayed)  Labs Reviewed  BASIC  METABOLIC PANEL - Abnormal; Notable for the following components:      Result Value   Glucose, Bld 103 (*)    Calcium 8.7 (*)    All other components within normal limits  CBC - Abnormal; Notable for the following components:   RBC 3.09 (*)    Hemoglobin 9.0 (*)    HCT 28.3 (*)    RDW 15.9 (*)    All other components within normal limits  TROPONIN I (HIGH SENSITIVITY) - Abnormal; Notable for the following components:   Troponin I (High Sensitivity) 250 (*)    All other components within normal limits  TROPONIN I (HIGH SENSITIVITY) - Abnormal; Notable for the following components:   Troponin I (High Sensitivity) 203 (*)    All other components within normal limits  RESP PANEL BY RT-PCR (FLU A&B, COVID) [id number]   ____________________________________________  EKG  Reviewed inter by me at 1500 Heart rate 60 QRS 99 QTc 420 Normal sinus rhythm.  Nonspecific T wave abnormality, perhaps some very minimal ST abnormality noted, Q-wave inferior lead no evidence of acute STEMI.  EKG discussed with Dr. Saunders Revel ____________________________________________  RADIOLOGY  DG Chest 2 View  Result Date: 05/19/2021 CLINICAL DATA:  Chest pain. EXAM: CHEST - 2 VIEW COMPARISON:  04/13/2021 FINDINGS: The cardiomediastinal silhouette is within normal limits. Aortic atherosclerosis is noted. No airspace consolidation, edema, pleural effusion, or pneumothorax is identified. No acute osseous abnormality is seen. IMPRESSION: No active cardiopulmonary disease. Electronically Signed   By: Logan Bores M.D.   On: 05/19/2021 15:54   CT Angio Chest Aorta W and/or Wo Contrast  Result Date: 05/19/2021 CLINICAL DATA:  Chest/back pain, status post stent placement for recent MI EXAM: CT ANGIOGRAPHY CHEST WITH CONTRAST TECHNIQUE: Multidetector CT imaging of the chest was performed using the standard protocol during bolus administration of intravenous contrast. Multiplanar CT image reconstructions and MIPs were obtained to  evaluate the vascular anatomy. CONTRAST:  14mL OMNIPAQUE IOHEXOL 350 MG/ML SOLN COMPARISON:  Chest radiographs dated 05/19/2021. CT chest dated 04/02/2021. FINDINGS: Cardiovascular: Preferential opacification of the thoracic aorta. No evidence of thoracic aortic aneurysm or dissection. Atherosclerotic calcifications of the aortic arch. Although not tailored for evaluation of the pulmonary arteries, there is no evidence of central pulmonary embolism. The heart is normal in size.  No pericardial effusion. Coronary atherosclerosis  of the LAD and right coronary artery. Mediastinum/Nodes: No suspicious mediastinal lymphadenopathy. Visualized thyroid is unremarkable. Lungs/Pleura: Mild biapical pleural parenchymal scarring. Mild centrilobular and paraseptal emphysematous changes, upper lung predominant. No focal consolidation. No suspicious pulmonary nodules. No pleural effusion or pneumothorax. Upper Abdomen: Visualized upper abdomen is grossly unremarkable, noting vascular calcifications. Musculoskeletal: Degenerative changes of the visualized thoracolumbar spine. Review of the MIP images confirms the above findings. IMPRESSION: No evidence of thoracic aortic aneurysm or dissection No evidence of acute cardiopulmonary disease. Aortic Atherosclerosis (ICD10-I70.0) and Emphysema (ICD10-J43.9). Electronically Signed   By: Julian Hy M.D.   On: 05/19/2021 19:06     CT angiogram of chest reviewed negative for aneurysm or dissection.  No evidence of acute finding. ____________________________________________   PROCEDURES  Procedure(s) performed: None  Procedures  Critical Care performed: No  ____________________________________________   INITIAL IMPRESSION / ASSESSMENT AND PLAN / ED COURSE  Pertinent labs & imaging results that were available during my care of the patient were reviewed by me and considered in my medical decision making (see chart for details).  Differential diagnosis includes, but  is not limited to, ACS, aortic dissection, pulmonary embolism, cardiac tamponade, pneumothorax, pneumonia, pericarditis, myocarditis, GI-related causes including esophagitis/gastritis, and musculoskeletal chest wall pain.     Clinical Course as of 05/19/21 2352  Mon May 19, 2021  1640 Case discussed with Dr. Saunders Revel. Agrees with obtaining CTA chest, further troponin monitoring. Minimum plan to admit for obs if intial eval reassuring in ED. Rule out effusion, rule out dissection, rule out acs [MQ]  1641 Dr. Saunders Revel advises against initiating heparin or anticoagulation at this point unless CTA no di [MQ]  2249 Checked on patient, he is resting comfortably in the recliner.  His troponin is downtrending.  Previously discussed with cardiologist Dr. Saunders Revel.  Patient concerned that he has had a's extremely long wait time, and I agree with him, its been extremely long wait due to hospital capacity issues and staffing.  Some circumstances are beyond her immediate control.  Discussed with him further and he advises that he would really like to be able to be discharged therefore he could follow-up outpatient with a cardiologist and return if his pain or symptoms worsen as his goal.  Shared medical decision making discussed risks and benefits, and patient strongly encourages that he wishes to be discharged to follow-up.  Given his downtrending troponin, resolution of pain, I do not think this is unreasonable.  Especially given the current context of our ED boarding situation [MQ]    Clinical Course User Index [MQ] Delman Kitten, MD   ----------------------------------------- 11:51 PM on 05/19/2021 ----------------------------------------- Discussed the patient's case with cardiologist Dr. Cathie Olden, he will communicate with the cardiology team to contact the patient for urgent follow-up repeat appointment.  He also reviewed the patient's evaluation and work-up with me, and advises given the downtrending troponin reassuring  evaluation and his evaluation here in the ER thinks it would be reasonable to discharge the patient as well.  Discussed with the patient, patient understand agreeable with plan for discharge and follow-up.  Very careful return precautions discussed  Return precautions and treatment recommendations and follow-up discussed with the patient who is agreeable with the plan.   ____________________________________________   FINAL CLINICAL IMPRESSION(S) / ED DIAGNOSES  Final diagnoses:  Chest pain with low risk for cardiac etiology        Note:  This document was prepared using Dragon voice recognition software and may include unintentional dictation errors  Delman Kitten, MD 05/19/21 2352

## 2021-05-19 NOTE — ED Triage Notes (Addendum)
Pt arrives via ems from home pt just had a heart attack and a stent placed last week. Pt states that this am he started having pain across his shoulders that radiated into his chest, pt states that it occurred after he took a new medication  Pt reports that ems gave him asa and 2 nitro sl

## 2021-05-19 NOTE — ED Notes (Signed)
MD informed of troponin value.

## 2021-05-19 NOTE — ED Notes (Addendum)
Pt comes via EMS with c/o CP from home. Pt had stent placed last week after MI on Tuesday. Pt states CP that started at 10am  Pt given 324 aspirin and .4 of nitro. Pt denies any current pain.

## 2021-05-20 NOTE — Progress Notes (Signed)
Cardiology Office Note:    Date:  05/21/2021   ID:  Cesar Powell, DOB 13-Dec-1930, MRN [id number]  PCP:  Owens Loffler, MD  [facility] Cardiologist:  Dr. Darlis [facility] Electrophysiologist:  None   Referring MD: Owens Loffler, MD   Chief Complaint: hospital f/u  History of Present Illness:    Cesar Powell is a 87 y.o. male with a hx of hypertension, metastatic prostate cancer, obstructive sleep apnea, and chronic anemia who is being seen for hospital follow-up.   HE was recently admitted for syncope. Felt hot with some chest tightness, felt funny/lightheaded. EMS was called and he was found to have STEMI in the setting of hypotension and bradycardia. He was brought to [facility] where is was given IVF. Rhythm seemed to by junctional rhythm. Emergent CT was unremarkable. He was transferred for emergent cath and possible PCI, which showed severe multivessel CAD.  Culprit lesion was a thrombotic 95% proximal RCA stenosis with some clot embolization into distal branches.  The proximal RCA was successfully treated with primary PCI and subsequent resolution of chest pain.  Went to the ED 10/17 for chest tightness. CT chest was negative. CXR negative. HS trop down-trending 250>203. Case was discussed with Dr. Acie Fredrickson and Dr. Saunders Revel and they agreed to close OP follow-up.   Today, the patient denies recurrent chest pain. Denies missing doses of Aspirin or Birlinta. Denies shortness, LLE, orthopnea, pnd. Patient has generalized fatigue. Seems to be unchanged since discharge. Cardiac cath reviewed today. EKG without ischemic changes. Cath site, stable. Recent labs from the ED reviewed. Question about foley cath, recommend he stt urology. Says he still has urinary retention. Has some lower leg edema, but says it is better. Lifestyle changes discussed.   Past Medical History:  Diagnosis Date   H/O prostate cancer    was treated with radiation   Hypertension    Local recurrence of  prostate cancer ([place]) 06/21/2004   Qualifier: Diagnosis of  By: Fuller Plan CMA (AAMA), [place]     Obstructive sleep apnea 06/22/2007   NPSG [place] 1979 Unattended Home sleep Study- 05/10/14- Confirms severe OSA, AHI 41.8/ hr, weight 230 pounds CPAP and also Transcend portable CPAP auto 8-15     Sleep apnea 1977   uses C-pap     Past Surgical History:  Procedure Laterality Date   ABDOMINAL SURGERY     ruptured intestines    CATARACT EXTRACTION W/PHACO Left 01/29/2016   Procedure: CATARACT EXTRACTION PHACO AND INTRAOCULAR LENS PLACEMENT ([place]) left eye;  Surgeon: Leandrew Koyanagi, MD;  Location: [place];  Service: Ophthalmology;  Laterality: Left;  RESTOR LENS   CATARACT EXTRACTION W/PHACO Right 10/21/2016   Procedure: CATARACT EXTRACTION PHACO AND INTRAOCULAR LENS PLACEMENT ([facility])  Right restor toric lens;  Surgeon: Leandrew Koyanagi, MD;  Location: [place];  Service: Ophthalmology;  Laterality: Right;  restor toric lens   CORONARY/GRAFT ACUTE MI REVASCULARIZATION N/A 05/13/2021   Procedure: Coronary/Graft Acute MI Revascularization;  Surgeon: Nelva Bush, MD;  Location: [place] CV LAB;  Service: Cardiovascular;  Laterality: N/A;   CYSTOSCOPY WITH LITHOLAPAXY N/A 08/14/2016   Procedure: CYSTOSCOPY WITH LITHOLAPAXY;  Surgeon: Nickie Retort, MD;  Location: [facility];  Service: Urology;  Laterality: N/A;   FEMUR IM NAIL Right 10/08/2014   Procedure: INTRAMEDULLARY (IM) NAIL FEMORAL;  Surgeon: Rod Can, MD;  Location: [place];  Service: Orthopedics;  Laterality: Right;  PER DANIELLE 110 MIN   HOLMIUM LASER APPLICATION  3/78/5885   Procedure: HOLMIUM  LASER APPLICATION;  Surgeon: Nickie Retort, MD;  Location: [facility];  Service: Urology;;   LEFT HEART CATH AND CORONARY ANGIOGRAPHY N/A 05/13/2021   Procedure: LEFT HEART CATH AND CORONARY ANGIOGRAPHY;  Surgeon: Nelva Bush, MD;  Location: [place] CV LAB;  Service: Cardiovascular;  Laterality:  N/A;   OTHER SURGICAL HISTORY  2006   Prostate Radiation Surgery    RADIOACTIVE SEED IMPLANT N/A 10/30/2019   Procedure: RADIOACTIVE SEED IMPLANT/BRACHYTHERAPY IMPLANT;  Surgeon: Billey Co, MD;  Location: [facility];  Service: Urology;  Laterality: N/A;  41 seeds implanted    Current Medications: Current Meds  Medication Sig   ascorbic acid (VITAMIN C) 500 MG tablet Take 1,000 mg by mouth daily.   aspirin 81 MG chewable tablet Chew 1 tablet (81 mg total) by mouth daily.   atorvastatin (LIPITOR) 80 MG tablet Take 1 tablet (80 mg total) by mouth daily.   Cholecalciferol (VITAMIN D3) 50 MCG (2000 UT) TABS Take 2,000 Units by mouth daily.   isosorbide mononitrate (IMDUR) 30 MG 24 hr tablet Take 0.5 tablets (15 mg total) by mouth daily.   losartan (COZAAR) 25 MG tablet Take 0.5 tablets (12.5 mg total) by mouth daily.   Multiple Vitamins-Minerals (ZINC PO) Take 1 tablet by mouth daily.   nitroGLYCERIN (NITROSTAT) 0.4 MG SL tablet Place 1 tablet (0.4 mg total) under the tongue every 5 (five) minutes as needed for chest pain.   Probiotic Product (PROBIOTIC PO) Take 1 capsule by mouth daily.   tamsulosin (FLOMAX) 0.4 MG CAPS capsule Take 1 capsule (0.4 mg total) by mouth daily after supper.   ticagrelor (BRILINTA) 90 MG TABS tablet Take 1 tablet (90 mg total) by mouth 2 (two) times daily.     Allergies:   Gluten meal   Social History   Socioeconomic History   Marital status: Married    Spouse name: Not on file   Number of children: Not on file   Years of education: Not on file   Highest education level: Not on file  Occupational History   Occupation: retired  Tobacco Use   Smoking status: Former    Packs/day: 1.00    Years: 15.00    Pack years: 15.00    Types: Cigarettes    Quit date: 09/14/1965    Years since quitting: 55.7   Smokeless tobacco: Never  Vaping Use   Vaping Use: Never used  Substance and Sexual Activity   Alcohol use: Not Currently    Comment: occassiona;     Drug use: No   Sexual activity: Yes    Birth control/protection: None  Other Topics Concern   Not on file  Social History Narrative   Not on file   Social Determinants of Health   Financial Resource Strain: Not on file  Food Insecurity: Not on file  Transportation Needs: Not on file  Physical Activity: Not on file  Stress: Not on file  Social Connections: Not on file     Family History: The patient's family history includes Heart attack in his brother; Heart disease in his brother; Lung cancer in his brother. There is no history of Prostate cancer, Bladder Cancer, or Kidney cancer.  ROS:   Please see the history of present illness.     All other systems reviewed and are negative.  EKGs/Labs/Other Studies Reviewed:    The following studies were reviewed today:  Echo 05/15/21  1. Left ventricular ejection fraction, by estimation, is 55 %. The left  ventricle has  normal function. The left ventricle has no regional wall  motion abnormalities. Left ventricular diastolic parameters are consistent  with Grade I diastolic dysfunction  (impaired relaxation).   2. Right ventricular systolic function is normal. The right ventricular  size is normal. There is normal pulmonary artery systolic pressure. The  estimated right ventricular systolic pressure is 93.2 mmHg.   3. Left atrial size was moderately dilated.   4. Right atrial size was moderately dilated.   5. The mitral valve is normal in structure. No evidence of mitral valve  regurgitation. Mild mitral stenosis.   LHC 05/13/21 Conclusions: Severe multivessel coronary artery disease, as detailed below.  The culprit lesion for the patient's inferior STEMI is a ruptured plaque with thrombus formation in the proximal RCA leading to 95% stenosis and distal embolization of clot. There is moderate-severe LAD and LCx disease that appears chronic. Mildly elevated left ventricular filling pressure (LVEDP 20 mmHg). Successful PCI to the  proximal RCA using Onyx Frontier 3.0 x 15 mm drug-eluting stent (postdilated to 3.4 mm) with 0% residual stenosis and TIMI-3 flow. Right radial artery spasm precluding advancement of 7F guide catheter.  Consider alternative access if interventions are needed in the future. Recommendations: Dual antiplatelet therapy with aspirin and ticagrelor for at least 12 months. Favor medical management of residual LCx, LAD, and RCA disease unless the patient has refractory angina. Aggressive secondary prevention. Close monitoring of hemoglobin.  Will need to consider PRBC transfusion for symptomatic anemia or significant drops in hemoglobin, though I would ideally like to avoid transfusion given associated risk for stent thrombosis. Obtain echocardiogram.  Left Heart   Left Ventricle LV end diastolic pressure is mildly elevated. LVEDP 20 mmHg.  Aortic Valve There is no aortic valve stenosis.    Coronary Diagrams   Diagnostic Dominance: Right Intervention          EKG:  EKG is  ordered today.  The ekg ordered today demonstrates NSR, 67bpm, 1st degree AV block, TWI III  Recent Labs: 04/13/2021: B Natriuretic Peptide 481.6 04/16/2021: Magnesium 2.1 05/13/2021: ALT 12 05/14/2021: TSH 6.122 05/19/2021: BUN 23; Creatinine, Ser 0.98; Hemoglobin 9.0; Platelets 299; Potassium 4.2; Sodium 135  Recent Lipid Panel    Component Value Date/Time   CHOL 150 05/14/2021 0433   TRIG 58 05/14/2021 0433   HDL 49 05/14/2021 0433   CHOLHDL 3.1 05/14/2021 0433   VLDL 12 05/14/2021 0433   LDLCALC 89 05/14/2021 0433   LDLDIRECT 148.9 12/07/2007 1217     Physical Exam:    VS:  BP (!) 132/58 (BP Location: Left Arm, Patient Position: Sitting, Cuff Size: Normal)   Pulse 67   Ht 5\' 7"  (1.702 m)   Wt 203 lb (92.1 kg)   SpO2 97%   BMI 31.79 kg/m     Wt Readings from Last 3 Encounters:  05/21/21 203 lb (92.1 kg)  05/19/21 199 lb (90.3 kg)  05/15/21 209 lb 10.5 oz (95.1 kg)     GEN:  Well nourished, well  developed in no acute distress HEENT: Normal NECK: No JVD; No carotid bruits LYMPHATICS: No lymphadenopathy CARDIAC: RRR, no murmurs, rubs, gallops RESPIRATORY:  Clear to auscultation without rales, wheezing or rhonchi  ABDOMEN: Soft, non-tender, non-distended MUSCULOSKELETAL:  1+ edema; No deformity  SKIN: Warm and dry NEUROLOGIC:  Alert and oriented x 3 PSYCHIATRIC:  Normal affect   ASSESSMENT:    1. STEMI involving right coronary artery ([place])   2. Chronic diastolic heart failure ([place])   3. Coronary artery  disease involving native coronary artery of native heart with refractory angina pectoris ([place])   4. Hyperlipidemia, mixed   5. OSA (obstructive sleep apnea)   6. Junctional bradycardia    PLAN:    In order of problems listed above: Chest pain CAD with Inferior STEMI s/p DES to PRCA Recently admitted with syncope and chest pain found to have inferior STEMI. LHC showed 90% pRCA, moderate Lcx and LAD disease, treated with DES to pRCA. He did have residual disease. He was dsicharged on DAPT with ASA and Brilinta for up to 12 months. He had subsequent ED visit for chest pain found to have down trending troponin, work-up benign. Since then no further chest pain episdoes. Denies missing doses of DAPT. No BB with h/o bradcyardia. EKG today shows SR, 67bpm, first degree AV block with no new ischemic changes. I will start Imdur 15mg  daily, I will also send in SL NTG, instructions given. Will see back in 2 weeks to evaluate symptoms, if stable at that time will refer to cardiac rehab. Cath site, right radial, stable. If patient still has refractory symptoms after optimization of medical therapy may need to re-cath given residual CAD.    H/o Cardiogenic shock/Junctional bradycardia  Cardiogenic shock on presentation. BB started but held in the hospital for sinus bradycardia, pauses and rates int he 40s. EKG today shows SR 1st degree AV block, nonspecific T wave changes.   HFpEF Echo showed  LVEF 55% with G1DD. He has lower leg edema on exam, says this is better since discharge, mostly dependent. CHF education given. If persistent may need low dose lasix to take daily. Continue BB. Consider ARB at follow-up.    Chronic Anemia Most recent Hgb 9. Continue ASA and Brilinta. Continue to monitor hemoglobin.   OSA Continue CPAP  HLD LDL 89. Continue Lipitor, can re-check lipids/LFTs at follow-up  Disposition: Follow up in 1 month(s) with Md/APP    Signed, Carys Malina Ninfa Meeker, PA-C  05/21/2021 4:21 PM    [facility]

## 2021-05-21 ENCOUNTER — Encounter: Payer: Self-pay | Admitting: Medical

## 2021-05-21 ENCOUNTER — Other Ambulatory Visit: Payer: Self-pay

## 2021-05-21 ENCOUNTER — Ambulatory Visit (INDEPENDENT_AMBULATORY_CARE_PROVIDER_SITE_OTHER): Payer: Medicare Other | Admitting: Medical

## 2021-05-21 VITALS — BP 132/58 | HR 67 | Ht 67.0 in | Wt 203.0 lb

## 2021-05-21 DIAGNOSIS — I2111 ST elevation (STEMI) myocardial infarction involving right coronary artery: Secondary | ICD-10-CM | POA: Diagnosis not present

## 2021-05-21 DIAGNOSIS — I5032 Chronic diastolic (congestive) heart failure: Secondary | ICD-10-CM | POA: Diagnosis not present

## 2021-05-21 DIAGNOSIS — E782 Mixed hyperlipidemia: Secondary | ICD-10-CM | POA: Diagnosis not present

## 2021-05-21 DIAGNOSIS — I25118 Atherosclerotic heart disease of native coronary artery with other forms of angina pectoris: Secondary | ICD-10-CM | POA: Diagnosis not present

## 2021-05-21 DIAGNOSIS — R001 Bradycardia, unspecified: Secondary | ICD-10-CM

## 2021-05-21 DIAGNOSIS — G4733 Obstructive sleep apnea (adult) (pediatric): Secondary | ICD-10-CM | POA: Diagnosis not present

## 2021-05-21 MED ORDER — NITROGLYCERIN 0.4 MG SL SUBL: 0.4000 mg | SUBLINGUAL_TABLET | SUBLINGUAL | 2 refills | Status: DC | PRN

## 2021-05-21 MED ORDER — ISOSORBIDE MONONITRATE ER 30 MG PO TB24: 15.0000 mg | ORAL_TABLET | Freq: Every day | ORAL | 3 refills | Status: DC

## 2021-05-21 NOTE — Patient Instructions (Signed)
Medication Instructions:  Your physician has recommended you make the following change in your medication:   If you experience chest pain: Place 1 nitroglycerin under your tongue, while sitting. If no relief of pain, may repeat 1 tablet every 5 minutes up to 3 tablets over 15 minutes. If no relief call 911. If you have dizziness/lightheadedness while taking nitroglycerin, stop taking and call 911.  START Isosorbide mononitrate 30 mg and take 1/2 tablet (15 mg) once daily   *If you need a refill on your cardiac medications before your next appointment, please call your pharmacy*   Lab Work: None  If you have labs (blood work) drawn today and your tests are completely normal, you will receive your results only by: [place] (if you have MyChart) OR A paper copy in the mail If you have any lab test that is abnormal or we need to change your treatment, we will call you to review the results.   Testing/Procedures: None    Follow-Up: At [facility], you and your health needs are our priority.  As part of our continuing mission to provide you with exceptional heart care, we have created designated Provider Care Teams.  These Care Teams include your primary Cardiologist (physician) and Advanced Practice Providers (APPs -  Physician Assistants and Nurse Practitioners) who all work together to provide you with the care you need, when you need it.   Your next appointment:   2 week(s)  The format for your next appointment:   In Person  Provider:   You may see Dr. Harrell Gave End or one of the following Advanced Practice Providers on your designated Care Team:   Murray Hodgkins, NP Christell Faith, PA-C Marrianne Mood, PA-C Cadence [place], [place]

## 2021-05-22 ENCOUNTER — Ambulatory Visit (INDEPENDENT_AMBULATORY_CARE_PROVIDER_SITE_OTHER): Payer: Medicare Other | Admitting: Family Medicine

## 2021-05-22 ENCOUNTER — Encounter: Payer: Self-pay | Admitting: Family Medicine

## 2021-05-22 VITALS — BP 130/72 | HR 61 | Temp 97.8°F | Ht 67.5 in | Wt 207.5 lb

## 2021-05-22 DIAGNOSIS — N170 Acute kidney failure with tubular necrosis: Secondary | ICD-10-CM | POA: Diagnosis not present

## 2021-05-22 DIAGNOSIS — R310 Gross hematuria: Secondary | ICD-10-CM | POA: Diagnosis not present

## 2021-05-22 DIAGNOSIS — N179 Acute kidney failure, unspecified: Secondary | ICD-10-CM | POA: Diagnosis not present

## 2021-05-22 DIAGNOSIS — D500 Iron deficiency anemia secondary to blood loss (chronic): Secondary | ICD-10-CM | POA: Diagnosis not present

## 2021-05-22 DIAGNOSIS — I2111 ST elevation (STEMI) myocardial infarction involving right coronary artery: Secondary | ICD-10-CM | POA: Diagnosis not present

## 2021-05-22 LAB — BASIC METABOLIC PANEL
BUN: 20 mg/dL (ref 6–23)
CO2: 28 mEq/L (ref 19–32)
Calcium: 9.1 mg/dL (ref 8.4–10.5)
Chloride: 101 mEq/L (ref 96–112)
Creatinine, Ser: 1.04 mg/dL (ref 0.40–1.50)
GFR: 64.01 mL/min (ref >60.00)
Glucose, Bld: 96 mg/dL (ref 70–99)
Potassium: 4.5 mEq/L (ref 3.5–5.1)
Sodium: 135 mEq/L (ref 135–145)

## 2021-05-22 LAB — CBC WITH DIFFERENTIAL/PLATELET
Basophils Absolute: 0 10*3/uL (ref 0.0–0.1)
Basophils Relative: 0.4 % (ref 0.0–3.0)
Eosinophils Absolute: 0.2 10*3/uL (ref 0.0–0.7)
Eosinophils Relative: 2.6 % (ref 0.0–5.0)
HCT: 26.6 % — ABNORMAL LOW (ref 39.0–52.0)
Hemoglobin: 8.6 g/dL — ABNORMAL LOW (ref 13.0–17.0)
Lymphocytes Relative: 7.2 % — ABNORMAL LOW (ref 12.0–46.0)
Lymphs Abs: 0.5 10*3/uL — ABNORMAL LOW (ref 0.7–4.0)
MCHC: 32.3 g/dL (ref 30.0–36.0)
MCV: 88.8 fl (ref 78.0–100.0)
Monocytes Absolute: 0.6 10*3/uL (ref 0.1–1.0)
Monocytes Relative: 7.8 % (ref 3.0–12.0)
Neutro Abs: 6.2 10*3/uL (ref 1.4–7.7)
Neutrophils Relative %: 82 % — ABNORMAL HIGH (ref 43.0–77.0)
Platelets: 355 10*3/uL (ref 150.0–400.0)
RBC: 3 Mil/uL — ABNORMAL LOW (ref 4.22–5.81)
RDW: 17 % — ABNORMAL HIGH (ref 11.5–15.5)
WBC: 7.5 10*3/uL (ref 4.0–10.5)

## 2021-05-22 LAB — HEPATIC FUNCTION PANEL
ALT: 13 U/L (ref 0–53)
AST: 13 U/L (ref 0–37)
Albumin: 3.7 g/dL (ref 3.5–5.2)
Alkaline Phosphatase: 57 U/L (ref 39–117)
Bilirubin, Direct: 0 mg/dL (ref 0.0–0.3)
Total Bilirubin: 0.4 mg/dL (ref 0.2–1.2)
Total Protein: 7.1 g/dL (ref 6.0–8.3)

## 2021-05-22 NOTE — Progress Notes (Signed)
Cesar T. Copland, MD, [place] at [facility] [place], [address]  Phone: [phone]  FAX: [phone]  Cesar Powell - 87 y.o. male  MRN [id number]  Date of Birth: [birth year]  Date: 05/22/2021  PCP: Owens Loffler, MD  Referral: Owens Loffler, MD  Chief Complaint  Patient presents with   Hospitalization Follow-up    MI    This visit occurred during the SARS-CoV-2 public health emergency.  Safety protocols were in place, including screening questions prior to the visit, additional usage of staff PPE, and extensive cleaning of exam room while observing appropriate contact time as indicated for disinfecting solutions.   Subjective:   Cesar Powell is a 87 y.o. very pleasant male patient with Body mass index is 32.02 kg/m. who presents with the following:  He is a well-known gentleman, and he has recently been in the hospital for different times and had 2 admissions.  Most recently he was admitted on May 13, 2021, and he was discharged on May 15, 2021.  At that time he did have a STEMI requiring percutaneous intervention.  He did have an acute inferior MI, and after left heart cath he did have a coronary stent placed on his RCA.  He had multivessel coronary disease on his catheterization.  With moderate to severe left circumflex and LAD disease.  Currently also he was discharged on aspirin, Brilinta, Lipitor, and metoprolol.  He also has been having issues with urinary retention over the last month, and he currently has an indwelling Foley catheter.  He also tells me that he has had blood in his Foley bag much of the time since his hospital discharge.  H he also had another visit to the hospital on May 19, 2021, and he was having chest pain, but this was deemed to be noncardiac in origin.  Initially, he went to the hospital after he had frank syncope and chest pain.  He did go to acute renal  injury while in the hospital.  He has not picked up ihs NTG - does not have a car.  His son is with him right now.  He has not started his delayed-release nitroglycerin either.  Has been having some blood in his urine.  Prostate cancer and that cut it off and was having a hard time  urinating Has a catheter right now.   Cardiology progress notes yesterday:  95% RCA proximal stenosis. Severe multi-vessel disease  He is on Aspirin, Brilinta. Cozaar, Imdur, Lipitor  This is for an entirely separate hospitalization, and ultimately I did not see him secondary to his multiple other medical issues and hospitalizations. Admit date: 04/13/2021 Discharge date: 04/16/2021  On this hospitalization he was found to have sodium of 122, hyperkalemia to 6.4 and a creatinine that peaked at 9.85.  At baseline he does have a normal creatinine.   -He was followed by nephrology and ultimately he did need to have a bicarb infusion in the ICU. -Nephrology felt like this was secondary to obstructive uropathy, and this did improve after catheter placement.  Review of Systems is noted in the HPI, as appropriate  Objective:   BP 130/72   Pulse 61   Temp 97.8 F (36.6 C) (Temporal)   Ht 5' 7.5" (1.715 m)   Wt 207 lb 8 oz (94.1 kg)   SpO2 99%   BMI 32.02 kg/m   GEN: No acute distress; alert,appropriate. PULM: Breathing comfortably in no respiratory  distress PSYCH: Normally interactive.  CV: RRR, no m/g/r  PULM: Normal respiratory rate, no accessory muscle use. No wheezes, crackles or rhonchi  ABD: S, NT, ND, + BS, No rebound, No HSM   Laboratory and Imaging Data Lab Review:  CBC EXTENDED Latest Ref Rng & Units 05/22/2021 05/19/2021 05/15/2021  WBC 4.0 - 10.5 K/uL 7.5 7.3 7.0  RBC 4.22 - 5.81 Mil/uL 3.00(L) 3.09(L) 2.94(L)  HGB 13.0 - 17.0 g/dL 8.6 Repeated and verified X2.(L) 9.0(L) 8.4(L)  HCT 39.0 - 52.0 % 26.6 Repeated and verified X2.(L) 28.3(L) 27.0(L)  PLT 150.0 - 400.0 K/uL 355.0 299 269   NEUTROABS 1.4 - 7.7 K/uL 6.2 - -  LYMPHSABS 0.7 - 4.0 K/uL 0.5(L) - -    BMP Latest Ref Rng & Units 05/22/2021 05/19/2021 05/15/2021  Glucose 70 - 99 mg/dL 96 103(H) 120(H)  BUN 6 - 23 mg/dL _0 Creatinine 0.40 - 1.50 mg/dL 1.04 0.98 1.16  BUN/Creat Ratio 10 - 24 - - -  Sodium 135 - 145 mEq/L 135 135 137  Potassium 3.5 - 5.1 mEq/L 4.5 4.2 4.1  Chloride 96 - 112 mEq/L 101 101 102  CO2 19 - 32 mEq/L _1 Calcium 8.4 - 10.5 mg/dL 9.1 8.7(L) 8.6(L)     Hepatic Function Latest Ref Rng & Units 05/22/2021 05/13/2021 04/14/2021  Total Protein 6.0 - 8.3 g/dL 7.1 6.8 -  Albumin 3.5 - 5.2 g/dL 3.7 3.4(L) 3.2(L)  AST 0 - 37 U/L 13 20 -  ALT 0 - 53 U/L 13 12 -  Alk Phosphatase 39 - 117 U/L 57 51 -  Total Bilirubin 0.2 - 1.2 mg/dL 0.4 0.8 -  Bilirubin, Direct 0.0 - 0.3 mg/dL 0.0 - -    Lab Results  Component Value Date   CHOL 150 05/14/2021   Lab Results  Component Value Date   HDL 49 05/14/2021   Lab Results  Component Value Date   LDLCALC 89 05/14/2021   Lab Results  Component Value Date   TRIG 58 05/14/2021   Lab Results  Component Value Date   CHOLHDL 3.1 05/14/2021   No results for input(s): PSA in the last 72 hours. No results found for: HCVAB Lab Results  Component Value Date   VD25OH 38.74 06/12/2020   VD25OH 33.03 06/03/2018   VD25OH 34.09 06/02/2017     Lab Results  Component Value Date   HGBA1C 5.7 (H) 05/14/2021   HGBA1C 6.1 06/12/2020   Lab Results  Component Value Date   LDLCALC 89 05/14/2021   CREATININE 1.04 05/22/2021     Assessment and Plan:     ICD-10-CM   1. STEMI involving right coronary artery (HCC)  I21.11 Hepatic function panel    2. Blood loss anemia  D50.0 CBC with Differential/Platelet    Hepatic function panel    3. Hematuria, gross  R31.0 CBC with Differential/Platelet    Hepatic function panel    4. AKI (acute kidney injury) ([place])  I50.2 Basic metabolic panel    Hepatic function panel    5. Acute renal  failure with tubular necrosis (HCC)  D74.1      Complicated case with 2 recent hospitalizations, 2 additional ER visits with initially an admission with acute renal failure to a creatinine of 9 secondary to obstruction which was relieved with catheter placement.  He did ultimately need to be in the ICU for renal failure as well as hyponatremia and hyperkalemia.  Nephrology was following in the hospital.  He does have a urologist in the outpatient world, and he has known metastatic prostate cancer.  His most recent discharge after his initial hospitalization from May 13, 2021, he did have an acute STEMI requiring percutaneous intervention of a drug-eluting stent of the RCA.  He had other multivessel coronary disease as well, and this is going to be managed medically.  He currently is on dual antiplatelet medication of aspirin as well as Brilinta.  He is also on appropriate doses of statin, beta-blocker, ARB, and he was recently started on extended release nitroglycerin.  He has appropriate follow-up with cardiology.  He also has had some urine in his Foley catheter bag, and he is not set up to see urology until November 2.  I think that some of this might have been lost in his different hospitalizations, I am going to follow-up with urology to see if they would like to see him sooner.  No orders of the defined types were placed in this encounter.  There are no discontinued medications. Orders Placed This Encounter  Procedures   CBC with Differential/Platelet   Basic metabolic panel   Hepatic function panel    Follow-up: Return in about 3 months (around 08/22/2021).  Dragon [employer] One speech-to-text software was used for transcription in this dictation.  Possible transcriptional errors can occur using Editor, commissioning.   Signed,  Maud Deed. Demarie Hyneman, MD   Outpatient Encounter Medications as of 05/22/2021  Medication Sig   ascorbic acid (VITAMIN C) 500 MG tablet Take 1,000 mg by mouth  daily.   aspirin 81 MG chewable tablet Chew 1 tablet (81 mg total) by mouth daily.   atorvastatin (LIPITOR) 80 MG tablet Take 1 tablet (80 mg total) by mouth daily.   Cholecalciferol (VITAMIN D3) 50 MCG (2000 UT) TABS Take 2,000 Units by mouth daily.   isosorbide mononitrate (IMDUR) 30 MG 24 hr tablet Take 0.5 tablets (15 mg total) by mouth daily.   losartan (COZAAR) 25 MG tablet Take 0.5 tablets (12.5 mg total) by mouth daily.   Multiple Vitamins-Minerals (ZINC PO) Take 1 tablet by mouth daily.   nitroGLYCERIN (NITROSTAT) 0.4 MG SL tablet Place 1 tablet (0.4 mg total) under the tongue every 5 (five) minutes as needed for chest pain.   Probiotic Product (PROBIOTIC PO) Take 1 capsule by mouth daily.   tamsulosin (FLOMAX) 0.4 MG CAPS capsule Take 1 capsule (0.4 mg total) by mouth daily after supper.   ticagrelor (BRILINTA) 90 MG TABS tablet Take 1 tablet (90 mg total) by mouth 2 (two) times daily.   No facility-administered encounter medications on file as of 05/22/2021.

## 2021-05-23 DIAGNOSIS — N170 Acute kidney failure with tubular necrosis: Secondary | ICD-10-CM | POA: Insufficient documentation

## 2021-05-23 DIAGNOSIS — N179 Acute kidney failure, unspecified: Secondary | ICD-10-CM | POA: Insufficient documentation

## 2021-05-23 DIAGNOSIS — N189 Chronic kidney disease, unspecified: Secondary | ICD-10-CM | POA: Insufficient documentation

## 2021-05-30 ENCOUNTER — Ambulatory Visit: Payer: Medicare Other | Admitting: Physician Assistant

## 2021-06-03 DIAGNOSIS — M9905 Segmental and somatic dysfunction of pelvic region: Secondary | ICD-10-CM | POA: Diagnosis not present

## 2021-06-03 DIAGNOSIS — M9903 Segmental and somatic dysfunction of lumbar region: Secondary | ICD-10-CM | POA: Diagnosis not present

## 2021-06-03 DIAGNOSIS — M62830 Muscle spasm of back: Secondary | ICD-10-CM | POA: Diagnosis not present

## 2021-06-03 DIAGNOSIS — M5136 Other intervertebral disc degeneration, lumbar region: Secondary | ICD-10-CM | POA: Diagnosis not present

## 2021-06-04 ENCOUNTER — Ambulatory Visit (INDEPENDENT_AMBULATORY_CARE_PROVIDER_SITE_OTHER): Payer: Medicare Other | Admitting: Urology

## 2021-06-04 ENCOUNTER — Other Ambulatory Visit: Payer: Self-pay

## 2021-06-04 ENCOUNTER — Encounter: Payer: Self-pay | Admitting: Urology

## 2021-06-04 VITALS — BP 148/70 | HR 79 | Ht 67.0 in | Wt 200.0 lb

## 2021-06-04 DIAGNOSIS — R339 Retention of urine, unspecified: Secondary | ICD-10-CM

## 2021-06-04 DIAGNOSIS — C61 Malignant neoplasm of prostate: Secondary | ICD-10-CM | POA: Diagnosis not present

## 2021-06-04 MED ORDER — CEPHALEXIN 500 MG PO CAPS
500.0000 mg | ORAL_CAPSULE | Freq: Two times a day (BID) | ORAL | 0 refills | Status: DC
Start: 2021-06-04 — End: 2021-07-01

## 2021-06-04 MED ORDER — CEPHALEXIN 250 MG PO CAPS
500.0000 mg | ORAL_CAPSULE | Freq: Once | ORAL | Status: AC
Start: 2021-06-04 — End: 2021-06-04
  Administered 2021-06-04: 500 mg via ORAL

## 2021-06-04 NOTE — Progress Notes (Signed)
   06/04/2021 10:50 AM   Cesar Powell 03-Nov-1930 [id number]  Reason for visit: Follow up metastatic prostate cancer, urinary retention  HPI: Very complex 87 year old male with metastatic prostate cancer who was previously deferred ADT, and is followed by oncology.  He developed a UTI in August 2022 treated with antibiotics, and he had opted for ongoing observation of his metastatic prostate cancer.  I personally viewed and interpreted the CT dated 04/02/2021 ordered by oncology that showed a nondistended bladder, no hydronephrosis, and brachytherapy seeds in the prostate.  He then presented to the ED on 04/13/2021 with suspected urinary retention and difficulty voiding and bladder scan greater than 1 L.  Nursing was unable to place a Foley, and Dr. Abner Greenspan with urology was consulted.  Bedside cystoscopy at that time was notable for a stricture at the bulbomembranous junction, but he was able to navigate into the bladder and passed a wire and a council Foley passed over the wire without any dilation required.  He then failed a voiding trial and follow-up on 04/22/2021, and Dr. Erlene Quan helped Debroah Loop, PA passed a 93 French silicone coud catheter into the bladder.  No urine reportedly was draining for the catheter overnight, and he ultimately required passage of a wire and council Foley placement over the wire with Dr. Bernardo Heater on 04/23/2021, and that Foley remains in place draining amber urine.  In the interim, he also had a STEMI in early October and ultimately underwent PCI with severe multivessel CAD and RCA stenosis that was stented, and he remains on anticoagulants Brilinta and aspirin.  He also remains on Flomax for his urinary symptoms.  We discussed his complex case at length including his metastatic prostate cancer, recent STEMI and need for ongoing anticoagulation with new stent, and urinary retention.  Possible etiologies of urinary retention include urethral stricture disease or  local obstruction from prostate cancer.  We discussed the need for cystoscopy for further evaluation, and the complex that he is of managing his urinary retention with need for anticoagulation moving forward.  I recommended cystoscopy tomorrow morning, and we will plan to sterilize the urine starting today with Keflex for 3 days twice daily dosing.  Return for cystoscopy tomorrow morning, possible voiding trial  I spent 45 total minutes on the day of the encounter including pre-visit review of the medical record, face-to-face time with the patient, and post visit ordering of labs/imaging/tests.  Billey Co, [facility] [address] [place], [place] [address] [phone]

## 2021-06-05 ENCOUNTER — Ambulatory Visit: Payer: Medicare Other | Admitting: Urology

## 2021-06-05 ENCOUNTER — Ambulatory Visit (INDEPENDENT_AMBULATORY_CARE_PROVIDER_SITE_OTHER): Payer: Medicare Other | Admitting: Urology

## 2021-06-05 ENCOUNTER — Encounter: Payer: Self-pay | Admitting: Urology

## 2021-06-05 VITALS — BP 138/65 | HR 78 | Ht 67.0 in | Wt 200.0 lb

## 2021-06-05 DIAGNOSIS — R339 Retention of urine, unspecified: Secondary | ICD-10-CM

## 2021-06-05 LAB — BLADDER SCAN AMB NON-IMAGING

## 2021-06-05 MED ORDER — LIDOCAINE HCL URETHRAL/MUCOSAL 2 % EX GEL
1.0000 "application " | Freq: Once | CUTANEOUS | Status: AC
  Administered 2021-06-05: 1 via URETHRAL

## 2021-06-05 NOTE — Progress Notes (Signed)
Cystoscopy Procedure Note:  Indication: Urinary retention  Complex 87 year old male with metastatic prostate cancer who has deferred ADT. Originally treated with radiation in 2005, and was on lupron for biochemical recurrence prior to discontinuing secondary to side effects. He has a history of cystolitholopaxy by Dr Pilar Jarvis in 2018. He also underwent brachytherapy and XRT to abdominal nodes in 2021 with Dr Baruch Gouty.  Recently developed recurrent urinary retention, managed by multiple on call urology providers, unclear from notes if urethral stricture vs BNC vs prostate obstruction.  Additionally, recently admitted for STEMI 05/13/21 requiring PCI, anticoagulated with brilinta and aspirin.  He was started on Keflex yesterday in the setting of his indwelling Foley in anticipation of voiding trial today.  After informed consent and discussion of the procedure and its risks, Cesar Powell was positioned and prepped in the standard fashion. Cystoscopy was performed with a flexible 38F cystoscope. The urethra, bladder neck and entire bladder was visualized in a standard fashion.  The prostatic urethra was white and slightly shaggy appearing consistent with prior radiation, but the cystoscope passed easily into the bladder without any resistance.  Urine slightly cloudy which limited vision within the bladder, and there was some mild erythema throughout consistent with indwelling Foley, but no suspicious lesions.    Careful pullback cystourethroscopy confirmed the prior findings with no evidence of stricture.  Imaging: CT A/P 04/02/2021 ordered by oncology with resolved retroperitoneal bilateral iliac lymph nodes, but new enlarged lymph nodes in the superior retroperitoneum at the level of the renal arteries concerning for recurrent nodal metastatic disease.  Brachytherapy seeds in the prostate  Findings: Cystoscope passed easily into the bladder, no stricture, prostatic urethra whitish and consistent  with prior radiation  ---------------------------------------------------------------------------------------------  Assessment and Plan: Will attempt a trial of void today, and he has a follow-up this afternoon for PVR.    He has been unable to urinate throughout the day, and bladder scan this afternoon shows 450 mL and he has lower abdominal discomfort consistent with urge to void.  He was taught intermittent catheterization, and was able to pass the catheter easily into his bladder.  He likely has a component of atonic bladder with his long history of incomplete bladder emptying and prostate cancer.  We discussed the challenges of managing an atonic bladder, especially in the setting of his prior radiation.  I recommended intermittent catheterization 3-4 times daily, and return precautions were discussed extensively Finish 3-day course of Keflex to sterilize the urine in the setting of multiple recent catheters Close follow-up with me in 1 month for symptom check  I spent 30 total minutes additional to initial cystoscopy on the day of the encounter including pre-visit review of the medical record, face-to-face time with the patient, and post visit ordering of labs/imaging/tests.   Nickolas Madrid, MD 06/05/2021

## 2021-06-05 NOTE — Patient Instructions (Signed)

## 2021-06-05 NOTE — Progress Notes (Signed)
Continuous Intermittent Catheterization  Due to urinary retention patient is present today for a teaching of self I & O Catheterization. Patient was given detailed verbal and printed instructions of self catheterization. Patient was cleaned and prepped in a sterile fashion. With instruction and assistance patient inserted a 12FR Coude and urine return was noted 500 ml, urine was yellow in color. Patient tolerated well, no complications were noted. Patient was given a sample bag with supplies to take home.  Instructions were given per Dr.Sninsky for patient to cath 3 times daily. An order was placed with [facility] for catheters to be sent to the patient's home. Patient is to follow up in 1 month.   Performed by: Gordy Clement, CMA   Additional Notes: RTC in 1 month as scheduled.

## 2021-06-06 ENCOUNTER — Encounter: Payer: Self-pay | Admitting: Medical

## 2021-06-06 ENCOUNTER — Ambulatory Visit (INDEPENDENT_AMBULATORY_CARE_PROVIDER_SITE_OTHER): Payer: Medicare Other | Admitting: Physician Assistant

## 2021-06-06 ENCOUNTER — Other Ambulatory Visit: Payer: Self-pay

## 2021-06-06 ENCOUNTER — Ambulatory Visit (INDEPENDENT_AMBULATORY_CARE_PROVIDER_SITE_OTHER): Payer: Medicare Other | Admitting: Medical

## 2021-06-06 VITALS — BP 130/50 | HR 71 | Ht 67.0 in | Wt 212.2 lb

## 2021-06-06 DIAGNOSIS — R001 Bradycardia, unspecified: Secondary | ICD-10-CM

## 2021-06-06 DIAGNOSIS — I25118 Atherosclerotic heart disease of native coronary artery with other forms of angina pectoris: Secondary | ICD-10-CM

## 2021-06-06 DIAGNOSIS — R339 Retention of urine, unspecified: Secondary | ICD-10-CM | POA: Diagnosis not present

## 2021-06-06 DIAGNOSIS — D649 Anemia, unspecified: Secondary | ICD-10-CM | POA: Diagnosis not present

## 2021-06-06 DIAGNOSIS — I5032 Chronic diastolic (congestive) heart failure: Secondary | ICD-10-CM

## 2021-06-06 DIAGNOSIS — I2111 ST elevation (STEMI) myocardial infarction involving right coronary artery: Secondary | ICD-10-CM | POA: Diagnosis not present

## 2021-06-06 DIAGNOSIS — N39490 Overflow incontinence: Secondary | ICD-10-CM

## 2021-06-06 DIAGNOSIS — E782 Mixed hyperlipidemia: Secondary | ICD-10-CM

## 2021-06-06 LAB — BLADDER SCAN AMB NON-IMAGING

## 2021-06-06 NOTE — Patient Instructions (Addendum)
Medication Instructions:  Your physician has recommended you make the following change in your medication:   STOP Isosorbide mononitrate (Imdur)   *If you need a refill on your cardiac medications before your next appointment, please call your pharmacy*   Lab Work: CBC today  If you have labs (blood work) drawn today and your tests are completely normal, you will receive your results only by: [place] (if you have MyChart) OR A paper copy in the mail If you have any lab test that is abnormal or we need to change your treatment, we will call you to review the results.   Testing/Procedures: None    Follow-Up: At [facility], you and your health needs are our priority.  As part of our continuing mission to provide you with exceptional heart care, we have created designated Provider Care Teams.  These Care Teams include your primary Cardiologist (physician) and Advanced Practice Providers (APPs -  Physician Assistants and Nurse Practitioners) who all work together to provide you with the care you need, when you need it.   Your next appointment:   3 month(s)  The format for your next appointment:   In Person  Provider:   Kathlyn Sacramento, MD or Cadence Jorene Minors    Other Instructions Referral placed for Cardiac rehab services. They will call you to schedule that appointment.  Their number is  [phone]

## 2021-06-06 NOTE — Progress Notes (Signed)
Cardiology Office Note:    Date:  06/06/2021   ID:  [name], DOB 10-23-1930, MRN [id number]  PCP:  Owens Loffler, MD  [facility] Cardiologist:  Dr. Darl[facility] Electrophysiologist:  None   Referring MD: Owens Loffler, MD   Chief Complaint: 2 week follow-up  History of Present Illness:    Cesar Powell is a 87 y.o. male with a hx of hypertension, metastatic prostate cancer, obstructive sleep apnea, and chronic anemia who is being seen for hospital follow-up.    HE was recently admitted for syncope. Felt hot with some chest tightness, felt funny/lightheaded. EMS was called and he was found to have STEMI in the setting of hypotension and bradycardia. He was brought to [facility] where is was given IVF. Rhythm seemed to by junctional rhythm. Emergent CT was unremarkable. He was transferred for emergent cath and possible PCI, which showed severe multivessel CAD.  Culprit lesion was a thrombotic 95% proximal RCA stenosis with some clot embolization into distal branches.  The proximal RCA was successfully treated with primary PCI and subsequent resolution of chest pain.   Went to the ED 10/17 for chest tightness. CT chest was negative. CXR negative. HS trop down-trending 250>203. Case was discussed with Dr. Acie Fredrickson and Dr. Saunders Revel and they agreed to close OP follow-up.   Last seen 05/21/21 and denied recurrent chest pain. He was started on Imdur 15mg  daily.   Today, the patient denies any recurrent chest pain. He has been taking Imdur 15mg  and has lightheadedness 30 minutes after taking this. This has occurred only a couple times, does not greatly affect his lifestyle. Reports blood in his urine about daily. Can be very red, urine is dark. He says it's possible that the catheter was causing him to bleed. Cath was recently changed, he will monitor blood in his urine.  Hgb is low, appears to be since August at [facility]. PCP says  he needs iron, has not started taking this. Needs  referral to cardiac rehab. Bp at home in the 130s/50s. Orthostatics positive from sitting to standing.   Past Medical History:  Diagnosis Date   H/O prostate cancer    was treated with radiation   Hypertension    Local recurrence of prostate cancer ([place]) 06/21/2004   Qualifier: Diagnosis of  By: Fuller Plan CMA (AAMA), [place]     Obstructive sleep apnea 06/22/2007   NPSG [place] 1979 Unattended Home sleep Study- 05/10/14- Confirms severe OSA, AHI 41.8/ hr, weight 230 pounds CPAP and also Transcend portable CPAP auto 8-15     Sleep apnea 1977   uses C-pap     Past Surgical History:  Procedure Laterality Date   ABDOMINAL SURGERY     ruptured intestines    CATARACT EXTRACTION W/PHACO Left 01/29/2016   Procedure: CATARACT EXTRACTION PHACO AND INTRAOCULAR LENS PLACEMENT ([place]) left eye;  Surgeon: Leandrew Koyanagi, MD;  Location: [facility];  Service: Ophthalmology;  Laterality: Left;  RESTOR LENS   CATARACT EXTRACTION W/PHACO Right 10/21/2016   Procedure: CATARACT EXTRACTION PHACO AND INTRAOCULAR LENS PLACEMENT ([facility])  Right restor toric lens;  Surgeon: Leandrew Koyanagi, MD;  Location: [facility];  Service: Ophthalmology;  Laterality: Right;  restor toric lens   CORONARY/GRAFT ACUTE MI REVASCULARIZATION N/A 05/13/2021   Procedure: Coronary/Graft Acute MI Revascularization;  Surgeon: Nelva Bush, MD;  Location: [facility];  Service: Cardiovascular;  Laterality: N/A;   CYSTOSCOPY WITH LITHOLAPAXY N/A 08/14/2016   Procedure: CYSTOSCOPY WITH LITHOLAPAXY;  Surgeon: Aaron Edelman  Harolyn Rutherford, MD;  Location: [facility];  Service: Urology;  Laterality: N/A;   FEMUR IM NAIL Right 10/08/2014   Procedure: INTRAMEDULLARY (IM) NAIL FEMORAL;  Surgeon: Rod Can, MD;  Location: [facility];  Service: Orthopedics;  Laterality: Right;  PER DANIELLE 110 MIN   HOLMIUM LASER APPLICATION  08/03/7508   Procedure: HOLMIUM LASER APPLICATION;  Surgeon: Nickie Retort, MD;  Location: [facility];   Service: Urology;;   LEFT HEART CATH AND CORONARY ANGIOGRAPHY N/A 05/13/2021   Procedure: LEFT HEART CATH AND CORONARY ANGIOGRAPHY;  Surgeon: Nelva Bush, MD;  Location: [facility];  Service: Cardiovascular;  Laterality: N/A;   OTHER SURGICAL HISTORY  2006   Prostate Radiation Surgery    RADIOACTIVE SEED IMPLANT N/A 10/30/2019   Procedure: RADIOACTIVE SEED IMPLANT/BRACHYTHERAPY IMPLANT;  Surgeon: Billey Co, MD;  Location: [facility];  Service: Urology;  Laterality: N/A;  41 seeds implanted    Current Medications: Current Meds  Medication Sig   ascorbic acid (VITAMIN C) 500 MG tablet Take 1,000 mg by mouth daily.   aspirin 81 MG chewable tablet Chew 1 tablet (81 mg total) by mouth daily.   atorvastatin (LIPITOR) 80 MG tablet Take 1 tablet (80 mg total) by mouth daily.   cephALEXin (KEFLEX) 500 MG capsule Take 1 capsule (500 mg total) by mouth 2 (two) times daily.   Cholecalciferol (VITAMIN D3) 50 MCG (2000 UT) TABS Take 2,000 Units by mouth daily.   losartan (COZAAR) 25 MG tablet Take 0.5 tablets (12.5 mg total) by mouth daily.   Multiple Vitamins-Minerals (ZINC PO) Take 1 tablet by mouth daily.   nitroGLYCERIN (NITROSTAT) 0.4 MG SL tablet Place 1 tablet (0.4 mg total) under the tongue every 5 (five) minutes as needed for chest pain.   Probiotic Product (PROBIOTIC PO) Take 1 capsule by mouth daily.   tamsulosin (FLOMAX) 0.4 MG CAPS capsule Take 1 capsule (0.4 mg total) by mouth daily after supper.   ticagrelor (BRILINTA) 90 MG TABS tablet Take 1 tablet (90 mg total) by mouth 2 (two) times daily.   [DISCONTINUED] isosorbide mononitrate (IMDUR) 30 MG 24 hr tablet Take 0.5 tablets (15 mg total) by mouth daily.     Allergies:   Gluten meal   Social History   Socioeconomic History   Marital status: Married    Spouse name: Not on file   Number of children: Not on file   Years of education: Not on file   Highest education level: Not on file  Occupational History    Occupation: retired  Tobacco Use   Smoking status: Former    Packs/day: 1.00    Years: 15.00    Pack years: 15.00    Types: Cigarettes    Quit date: 09/14/1965    Years since quitting: 55.7   Smokeless tobacco: Never  Vaping Use   Vaping Use: Never used  Substance and Sexual Activity   Alcohol use: Not Currently    Comment: occassiona;    Drug use: No   Sexual activity: Yes    Birth control/protection: None  Other Topics Concern   Not on file  Social History Narrative   Not on file   Social Determinants of Health   Financial Resource Strain: Not on file  Food Insecurity: Not on file  Transportation Needs: Not on file  Physical Activity: Not on file  Stress: Not on file  Social Connections: Not on file     Family History: The patient's family history includes Heart attack in  his brother; Heart disease in his brother; Lung cancer in his brother. There is no history of Prostate cancer, Bladder Cancer, or Kidney cancer.  ROS:   Please see the history of present illness.     All other systems reviewed and are negative.  EKGs/Labs/Other Studies Reviewed:    The following studies were reviewed today:  Echo 05/15/21  1. Left ventricular ejection fraction, by estimation, is 55 %. The left  ventricle has normal function. The left ventricle has no regional wall  motion abnormalities. Left ventricular diastolic parameters are consistent  with Grade I diastolic dysfunction  (impaired relaxation).   2. Right ventricular systolic function is normal. The right ventricular  size is normal. There is normal pulmonary artery systolic pressure. The  estimated right ventricular systolic pressure is 05.3 mmHg.   3. Left atrial size was moderately dilated.   4. Right atrial size was moderately dilated.   5. The mitral valve is normal in structure. No evidence of mitral valve  regurgitation. Mild mitral stenosis.    LHC 05/13/21 Conclusions: Severe multivessel coronary artery  disease, as detailed below.  The culprit lesion for the patient's inferior STEMI is a ruptured plaque with thrombus formation in the proximal RCA leading to 95% stenosis and distal embolization of clot. There is moderate-severe LAD and LCx disease that appears chronic. Mildly elevated left ventricular filling pressure (LVEDP 20 mmHg). Successful PCI to the proximal RCA using Onyx Frontier 3.0 x 15 mm drug-eluting stent (postdilated to 3.4 mm) with 0% residual stenosis and TIMI-3 flow. Right radial artery spasm precluding advancement of 16F guide catheter.  Consider alternative access if interventions are needed in the future. Recommendations: Dual antiplatelet therapy with aspirin and ticagrelor for at least 12 months. Favor medical management of residual LCx, LAD, and RCA disease unless the patient has refractory angina. Aggressive secondary prevention. Close monitoring of hemoglobin.  Will need to consider PRBC transfusion for symptomatic anemia or significant drops in hemoglobin, though I would ideally like to avoid transfusion given associated risk for stent thrombosis. Obtain echocardiogram.  Left Heart   Left Ventricle LV end diastolic pressure is mildly elevated. LVEDP 20 mmHg.  Aortic Valve There is no aortic valve stenosis.    Coronary Diagrams   Diagnostic Dominance: Right Intervention            EKG:  EKG is  ordered today.  The ekg ordered today demonstrates NSR, 71bpm, ?first degree AV block, nonspecific T wave changes, poor R wave progression  Recent Labs: 04/13/2021: B Natriuretic Peptide 481.6 04/16/2021: Magnesium 2.1 05/14/2021: TSH 6.122 05/22/2021: ALT 13; BUN 20; Creatinine, Ser 1.04; Hemoglobin 8.6 Repeated and verified X2.; Platelets 355.0; Potassium 4.5; Sodium 135  Recent Lipid Panel    Component Value Date/Time   CHOL 150 05/14/2021 0433   TRIG 58 05/14/2021 0433   HDL 49 05/14/2021 0433   CHOLHDL 3.1 05/14/2021 0433   VLDL 12 05/14/2021 0433    LDLCALC 89 05/14/2021 0433   LDLDIRECT 148.9 12/07/2007 1217    Physical Exam:    VS:  BP (!) 130/50 (BP Location: Left Arm, Patient Position: Sitting, Cuff Size: Normal)   Pulse 71   Ht 5\' 7"  (1.702 m)   Wt 212 lb 4 oz (96.3 kg)   SpO2 98%   BMI 33.24 kg/m     Wt Readings from Last 3 Encounters:  06/06/21 212 lb 4 oz (96.3 kg)  06/05/21 200 lb (90.7 kg)  06/04/21 200 lb (90.7 kg)  GEN:  Well nourished, well developed in no acute distress HEENT: Normal NECK: No JVD; No carotid bruits LYMPHATICS: No lymphadenopathy CARDIAC: RRR, no murmurs, rubs, gallops RESPIRATORY:  Clear to auscultation without rales, wheezing or rhonchi  ABDOMEN: Soft, non-tender, non-distended MUSCULOSKELETAL:  No edema; No deformity  SKIN: Warm and dry NEUROLOGIC:  Alert and oriented x 3 PSYCHIATRIC:  Normal affect   ASSESSMENT:    1. STEMI involving right coronary artery ([place])   2. Coronary artery disease involving native coronary artery of native heart with refractory angina pectoris (H[facility])   3. Junctional bradycardia   4. Chronic diastolic heart failure ([place])   5. Hyperlipidemia, mixed   6. Anemia, unspecified type    PLAN:    In order of problems listed above:  Chest pain CAD with Inferior STEMI s/p DES to pRCA At the last visit patient reported chest pain and was started on Imdur 15mg  daily. Since starting it he reports lightheadedness 30 minutes later. Denies further chest pain episodes. Orthostatics positive from sitting to standing. I will stop Imdur. He will monitor his anginal symptoms. If he has refractory symptoms, can consider Ranexa. Will refer to cardiac rehab. Continue DAPT with Aspirin and Brilinta. Reports blood in his urine, will check CBC today.   H/o cardiogenic shock/junctional bradycardia Avoid BB  HFpEF Echo showed LVEF 55% with G1DD. Patient is euvolemic on exam. Continue Losartan. No BB as above.   Chronic anemia Blood in the urine He is on DAPT with Aspirin and  Brilinta. He has a catheter in followed by Urology. Reports occasional blood in his urine, says it might possible be from the cath. This was recently changed and has not noticed blood sine the change. Hgb baseline appears to be 8-9. He said PCP advised he take iron, but has not started, I recommended he start this. CBC today, as above.   HLD LDL 89 05/2021, goal<70. He is on Atorvastatin 80mg  daily. Consider starting Zetia at follow-up.   Disposition: Follow up in 2 month(s) with MD/APP    Signed, Kinslie Hove Ninfa Meeker, PA-C  06/06/2021 2:42 PM    [facility]

## 2021-06-06 NOTE — Progress Notes (Signed)
06/06/2021 12:00 PM   Cesar Powell Apr 02, 1931 [id number]  CC: Chief Complaint  Patient presents with   Urinary Retention   HPI: Cesar Powell is a 87 y.o. male with PMH metastatic prostate cancer who has deferred ADT with a recent history of recurrent urinary retention counseled to start CIC yesterday by Dr. Diamantina Providence who presents today for evaluation of urinary leakage.   Today he reports having soft catheterized last night before bed without difficulty.  He did not measure his output at that time.  He woke up this morning to find his bed wet from significant urinary leakage.  He has not yet attempted to self catheterize today.  He denies lower abdominal pain or urgency.  PVR 469 mL.  Notably, patient reports significant urinary output overnight.  When he had a Foley catheter in place, he would have to empty his night bag approximately 3 times overnight.  He attempted self-catheterization in clinic today with a [employer] standard 16 [place] coud, Chief Financial Officer standard 14 [place] coud, and [employer] flex coud pro 58 [place], but met significant resistance at the level of the prostate and was unable to advance the catheter into his bladder.  PMH: Past Medical History:  Diagnosis Date   H/O prostate cancer    was treated with radiation   Hypertension    Local recurrence of prostate cancer ([place]) 06/21/2004   Qualifier: Diagnosis of  By: Fuller Plan CMA (AAMA), [place]     Obstructive sleep apnea 06/22/2007   NPSG [place] 1979 Unattended Home sleep Study- 05/10/14- Confirms severe OSA, AHI 41.8/ hr, weight 230 pounds CPAP and also Transcend portable CPAP auto 8-15     Sleep apnea 1977   uses C-pap     Surgical History: Past Surgical History:  Procedure Laterality Date   ABDOMINAL SURGERY     ruptured intestines    CATARACT EXTRACTION W/PHACO Left 01/29/2016   Procedure: CATARACT EXTRACTION PHACO AND INTRAOCULAR LENS PLACEMENT ([place]) left eye;  Surgeon: Leandrew Koyanagi, MD;   Location: [facility];  Service: Ophthalmology;  Laterality: Left;  RESTOR LENS   CATARACT EXTRACTION W/PHACO Right 10/21/2016   Procedure: CATARACT EXTRACTION PHACO AND INTRAOCULAR LENS PLACEMENT ([facility])  Right restor toric lens;  Surgeon: Leandrew Koyanagi, MD;  Location: [facility];  Service: Ophthalmology;  Laterality: Right;  restor toric lens   CORONARY/GRAFT ACUTE MI REVASCULARIZATION N/A 05/13/2021   Procedure: Coronary/Graft Acute MI Revascularization;  Surgeon: Nelva Bush, MD;  Location: [facility];  Service: Cardiovascular;  Laterality: N/A;   CYSTOSCOPY WITH LITHOLAPAXY N/A 08/14/2016   Procedure: CYSTOSCOPY WITH LITHOLAPAXY;  Surgeon: Nickie Retort, MD;  Location: [facility];  Service: Urology;  Laterality: N/A;   FEMUR IM NAIL Right 10/08/2014   Procedure: INTRAMEDULLARY (IM) NAIL FEMORAL;  Surgeon: Rod Can, MD;  Location: [facility];  Service: Orthopedics;  Laterality: Right;  PER DANIELLE 110 MIN   HOLMIUM LASER APPLICATION  10/30/760   Procedure: HOLMIUM LASER APPLICATION;  Surgeon: Nickie Retort, MD;  Location: [facility];  Service: Urology;;   LEFT HEART CATH AND CORONARY ANGIOGRAPHY N/A 05/13/2021   Procedure: LEFT HEART CATH AND CORONARY ANGIOGRAPHY;  Surgeon: Nelva Bush, MD;  Location: [facility];  Service: Cardiovascular;  Laterality: N/A;   OTHER SURGICAL HISTORY  2006   Prostate Radiation Surgery    RADIOACTIVE SEED IMPLANT N/A 10/30/2019   Procedure: RADIOACTIVE SEED IMPLANT/BRACHYTHERAPY IMPLANT;  Surgeon: Billey Co, MD;  Location: [facility];  Service: Urology;  Laterality:  N/A;  41 seeds implanted    Home Medications:  Allergies as of 06/06/2021       Reactions   Gluten Meal Rash        Medication List        Accurate as of June 06, 2021 12:00 PM. If you have any questions, ask your nurse or doctor.          ascorbic acid 500 MG tablet Commonly known as: VITAMIN C Take 1,000 mg by mouth  daily.   aspirin 81 MG chewable tablet Chew 1 tablet (81 mg total) by mouth daily.   atorvastatin 80 MG tablet Commonly known as: LIPITOR Take 1 tablet (80 mg total) by mouth daily.   cephALEXin 500 MG capsule Commonly known as: Keflex Take 1 capsule (500 mg total) by mouth 2 (two) times daily.   isosorbide mononitrate 30 MG 24 hr tablet Commonly known as: IMDUR Take 0.5 tablets (15 mg total) by mouth daily.   losartan 25 MG tablet Commonly known as: COZAAR Take 0.5 tablets (12.5 mg total) by mouth daily.   nitroGLYCERIN 0.4 MG SL tablet Commonly known as: NITROSTAT Place 1 tablet (0.4 mg total) under the tongue every 5 (five) minutes as needed for chest pain.   PROBIOTIC PO Take 1 capsule by mouth daily.   tamsulosin 0.4 MG Caps capsule Commonly known as: FLOMAX Take 1 capsule (0.4 mg total) by mouth daily after supper.   ticagrelor 90 MG Tabs tablet Commonly known as: BRILINTA Take 1 tablet (90 mg total) by mouth 2 (two) times daily.   Vitamin D3 50 MCG (2000 UT) Tabs Take 2,000 Units by mouth daily.   ZINC PO Take 1 tablet by mouth daily.        Allergies:  Allergies  Allergen Reactions   Gluten Meal Rash    Family History: Family History  Problem Relation Age of Onset   Heart disease Brother    Heart attack Brother    Lung cancer Brother    Prostate cancer Neg Hx    Bladder Cancer Neg Hx    Kidney cancer Neg Hx     Social History:   reports that he quit smoking about 55 years ago. His smoking use included cigarettes. He has a 15.00 pack-year smoking history. He has never used smokeless tobacco. He reports that he does not currently use alcohol. He reports that he does not use drugs.  Physical Exam: There were no vitals taken for this visit.  Constitutional:  Alert and oriented, no acute distress, nontoxic appearing HEENT: [place], AT Cardiovascular: No clubbing, cyanosis, or edema Respiratory: Normal respiratory effort, no increased work of  breathing Skin: No rashes, bruises or suspicious lesions Neurologic: Grossly intact, no focal deficits, moving all 4 extremities Psychiatric: Normal mood and affect  Laboratory Data: Results for orders placed or performed in visit on 06/06/21  Bladder Scan (Post Void Residual) in office  Result Value Ref Range   Scan Result 432m    Simple Catheter Placement  Due to urinary retention patient is present today for a foley cath placement.  Patient was cleaned and prepped in a sterile fashion with betadine and 2% lidocaine jelly was instilled into the urethra. A 18 FR coude foley catheter was inserted, urine return was noted  4523m urine was yellow in color.  The balloon was filled with 10cc of sterile water.  A leg bag was attached for drainage. Patient tolerated well, no complications were noted   Performed by: SaDebroah Loop  PA-C and [place], CMA  Assessment & Plan:   1. Overflow incontinence Significant urinary incontinence overnight accompanied by an elevated bladder scan this morning.  I suspect he was having overflow incontinence consistent with his known large volume urinary output overnight.  Given his sudden difficulty in advancing the catheter to his bladder, I suspect he may have created a false passage and recommended Foley catheter placement with plans for removal in 7 to 10 days.  He accepted, see procedure note above for further details.  Once his Foley is removed and false passage has resolved, I will recommend self-catheterization immediately before bed and immediately upon awakening.  We discussed wearing a condom catheter overnight to collect any urinary leakage, as patient tends to sleep through the night and does not think that he will awaken in the middle of the night to self cath.  Patient is in agreement with this plan. - Bladder Scan (Post Void Residual) in office  Return in about 10 days (around 06/16/2021) for Foley removal, resume CIC, condom cath  teaching.  I spent 35 minutes on the day of the encounter to include pre-visit record review, face-to-face time with the patient, and post-visit ordering of tests.   Debroah Loop, PA-C  [facility] [address] [place], [place] [address] [phone]

## 2021-06-07 LAB — CBC
Hematocrit: 25.5 % — ABNORMAL LOW (ref 37.5–51.0)
Hemoglobin: 8.1 g/dL — ABNORMAL LOW (ref 13.0–17.7)
MCH: 28.2 pg (ref 26.6–33.0)
MCHC: 31.8 g/dL (ref 31.5–35.7)
MCV: 89 fL (ref 79–97)
Platelets: 260 10*3/uL (ref 150–450)
RBC: 2.87 x10E6/uL — ABNORMAL LOW (ref 4.14–5.80)
RDW: 15.2 % (ref 11.6–15.4)
WBC: 6.6 10*3/uL (ref 3.4–10.8)

## 2021-06-09 ENCOUNTER — Telehealth: Payer: Self-pay | Admitting: *Deleted

## 2021-06-09 NOTE — Telephone Encounter (Signed)
Spoke with patient and reviewed cardiac rehab referral. He was not able to previously attend due to driving restrictions. At this time he would like to proceed with the program. Advised I would let them know and someone should be in touch to get that scheduled with him. He was appreciative for the call with no further questions at this time.

## 2021-06-09 NOTE — Telephone Encounter (Signed)
Referral was placed for cardiac rehab per provider request. Patient provided information and sent message over for review. Rehab had previously reached out to patient and he was not interested per their reports. Will update provider on this and then call to discuss with patient.

## 2021-06-09 NOTE — Telephone Encounter (Signed)
-----   Message from Lynford Humphrey, RN sent at 06/09/2021  7:21 AM EST ----- Jeannene Patella, We had a referral and he recently stated he was not interested.  Do we need to call him again?  Collie Siad  ----- Message ----- From: Valora Corporal, RN Sent: 06/06/2021   2:42 PM EST To: Lynford Humphrey, RN  Cardac Rehab ordered for this patient. Please let us know if you need any additional information.

## 2021-06-10 ENCOUNTER — Ambulatory Visit: Payer: Medicare Other | Admitting: Physician Assistant

## 2021-06-16 ENCOUNTER — Ambulatory Visit (INDEPENDENT_AMBULATORY_CARE_PROVIDER_SITE_OTHER): Payer: Medicare Other | Admitting: Physician Assistant

## 2021-06-16 ENCOUNTER — Ambulatory Visit: Payer: Medicare Other | Admitting: Physician Assistant

## 2021-06-16 ENCOUNTER — Other Ambulatory Visit: Payer: Self-pay

## 2021-06-16 DIAGNOSIS — R339 Retention of urine, unspecified: Secondary | ICD-10-CM | POA: Diagnosis not present

## 2021-06-16 DIAGNOSIS — N39490 Overflow incontinence: Secondary | ICD-10-CM

## 2021-06-16 NOTE — Progress Notes (Signed)
Catheter Removal  Patient is present today for a catheter removal.  9 ml of water was drained from the balloon. A 18 FR Coude foley cath was removed from the bladder no complications were noted . Patient tolerated well.  Performed by: Lesli Albee  Follow up/ Additional notes: As directed.

## 2021-06-16 NOTE — Progress Notes (Signed)
Patient presented to clinic today for Foley catheter removal and condom catheter teaching with plans to resume CIC.  Foley catheter was placed by me in clinic 10 days ago for management of a likely false passage that developed soon after starting self-catheterization.  Foley catheter removed in clinic, see separate procedure note for details.  Patient was subsequently able to pass a 14Fr coude Freight forwarder Standard into his bladder without difficulty.  We discussed the need to avoid inserting the catheter with force and instead plan to reposition by changing from a seated to standing position, increasing tension on the penis, or changing the angle of the penis to ease insertion.  Patient expressed understanding.  Patient to resume CIC at home.  We discussed titrating his frequency of self-catheterization to keep his urinary output below 350 mL at each catheterization.  Next, I demonstrated condom catheter sizing, application, and removal in clinic.  I also demonstrated night bag attachment and drainage.  All questions answered.  We discussed that he may use condom catheters overnight to capture any urinary leakage, however this does not take the place of self-catheterization and should not be performed during the daytime.  Patient to follow-up with Dr. Diamantina Providence next month as scheduled.  Debroah Loop, PA-C 06/16/21 1:29 PM  I spent 28 minutes on the day of the encounter to include pre-visit record review, face-to-face time with the patient, and post-visit ordering of tests.

## 2021-06-30 ENCOUNTER — Ambulatory Visit (INDEPENDENT_AMBULATORY_CARE_PROVIDER_SITE_OTHER): Payer: Medicare Other | Admitting: Urology

## 2021-06-30 ENCOUNTER — Encounter: Payer: Self-pay | Admitting: Radiation Oncology

## 2021-06-30 ENCOUNTER — Other Ambulatory Visit: Payer: Self-pay

## 2021-06-30 ENCOUNTER — Ambulatory Visit
Admission: RE | Admit: 2021-06-30 | Discharge: 2021-06-30 | Disposition: A | Discharge status: Home / Self Care | Payer: Medicare Other | Source: Ambulatory Visit | Attending: Radiation Oncology | Admitting: Radiation Oncology

## 2021-06-30 VITALS — BP 175/66 | HR 69 | Temp 97.5°F | Wt 221.1 lb

## 2021-06-30 DIAGNOSIS — Z923 Personal history of irradiation: Secondary | ICD-10-CM | POA: Diagnosis not present

## 2021-06-30 DIAGNOSIS — C61 Malignant neoplasm of prostate: Secondary | ICD-10-CM | POA: Diagnosis not present

## 2021-06-30 DIAGNOSIS — I252 Old myocardial infarction: Secondary | ICD-10-CM | POA: Insufficient documentation

## 2021-06-30 DIAGNOSIS — R339 Retention of urine, unspecified: Secondary | ICD-10-CM

## 2021-06-30 DIAGNOSIS — I2111 ST elevation (STEMI) myocardial infarction involving right coronary artery: Secondary | ICD-10-CM | POA: Diagnosis not present

## 2021-06-30 LAB — URINALYSIS, COMPLETE
Bilirubin, UA: NEGATIVE
Glucose, UA: NEGATIVE
Ketones, UA: NEGATIVE
Nitrite, UA: POSITIVE — AB
Specific Gravity, UA: 1.02 (ref 1.005–1.030)
Urobilinogen, Ur: 0.2 mg/dL (ref 0.2–1.0)
pH, UA: 6 (ref 5.0–7.5)

## 2021-06-30 LAB — MICROSCOPIC EXAMINATION: WBC, UA: >30 /hpf — AB (ref 0–5)

## 2021-06-30 LAB — BLADDER SCAN AMB NON-IMAGING

## 2021-06-30 NOTE — Progress Notes (Signed)
Radiation Oncology Follow up Note  Name: Cesar Powell   Date:   06/30/2021 MRN:  [id number] DOB: [birth year]    This 87 y.o. male presents to the clinic today for 39-month follow-up status post salvage radiation therapy by both external beam radiation therapy.  To his prostate and pelvic nodes as well as an I-125 interstitial implant for adenocarcinoma the prostate with biochemical failure.  REFERRING PROVIDER: Owens Loffler, MD  HPI: Patient is a 87 year old male now out 20 months having completed both external beam radiation therapy as well as I-125 interstitial implant for adenocarcinoma the prostate with biochemical failure.  Seen today in routine follow-up he has been having.  Urinary retention has a indwelling catheter placed today by urology.  He patient has consistently refused any ADT therapy.  His most recent PSA is 15.1 taken 1 month prior up from 10.15 months prior.  He is having no bone pain at this time.  Patient has been seen at [facility] by oncology who agrees with Dr. Elroy Channel treatment plan.  They have recommended starting possible ADT therapy when his PSA is 15-20.  Patient did have a recent MI had stent placed CT scan of the chest showed no evidence of thoracic aortic aneurysm or acute cardiopulmonary disease.  COMPLICATIONS OF TREATMENT: none  FOLLOW UP COMPLIANCE: keeps appointments   PHYSICAL EXAM:  BP (!) 175/66   Pulse 69   Temp (!) 97.5 F (36.4 C) (Tympanic)   Wt 221 lb 1.6 oz (100.3 kg)   BMI 34.63 kg/m  Well-developed well-nourished patient in NAD. HEENT reveals PERLA, EOMI, discs not visualized.  Oral cavity is clear. No oral mucosal lesions are identified. Neck is clear without evidence of cervical or supraclavicular adenopathy. Lungs are clear to A&P. Cardiac examination is essentially unremarkable with regular rate and rhythm without murmur rub or thrill. Abdomen is benign with no organomegaly or masses noted. Motor sensory and DTR levels are equal and  symmetric in the upper and lower extremities. Cranial nerves II through XII are grossly intact. Proprioception is intact. No peripheral adenopathy or edema is identified. No motor or sensory levels are noted. Crude visual fields are within normal range.  RADIOLOGY RESULTS: CT scan of the chest reviewed  PLAN: Present time patient will be returned to Dr. Janese Banks for evaluation.  I have explained to him I believe he should start ADT therapy in the near future and would treat with pulse therapy or to Dr. Elroy Channel recommendations.  I have asked to see him back in 6 months for follow-up.  He also continues close follow-up care with urology for urinary retention.  Patient comprehends my recommendations well.  I would like to take this opportunity to thank you for allowing me to participate in the care of your patient.Noreene Filbert, MD

## 2021-06-30 NOTE — Addendum Note (Signed)
Addended by: Verlene Mayer A on: 06/30/2021 10:44 AM   Modules accepted: Orders

## 2021-06-30 NOTE — Progress Notes (Signed)
06/30/2021 9:35 AM   Cesar Powell [birth year] [id number]  Referring provider: Owens Loffler, MD [address] [place],  [place] [address]  Chief Complaint  Patient presents with   Urinary Retention    HPI: SN: Elderly gentleman with metastatic prostate cancer who has had radiation followed by my partner.  Had an uneventful cystoscopy July 02, 2021.  On that day he had a failed trial of voiding was taught clean intermittent catheterization.  Reviewed chart and noted above.  Patient came in today not able to catheterize.  Nurse practitioner could not catheterize.    Patient has not been able to self catheterize well for 2 days.  After reviewing the last cystoscopy I laid the patient flat.  16 French coud catheter went in quite easily.  There was a little bit of resistance 1 mention from the meatus but none at the level of the bladder neck.  Because when and so easy and based on the last normal cystoscopy I did not re-cystoscope.  Patient is using coud catheters.  Leave catheter in for 1 week.  Bring patient back to be taught how to self catheterize again with penis on stretch.   PMH: Past Medical History:  Diagnosis Date   H/O prostate cancer    was treated with radiation   Hypertension    Local recurrence of prostate cancer ([facility]) 06/21/2004   Qualifier: Diagnosis of  By: Cesar Powell CMA (AAMA), Cesar Powell     Obstructive sleep apnea 06/22/2007   NPSG [place] 1979 Unattended Home sleep Study- 05/10/14- Confirms severe OSA, AHI 41.8/ hr, weight 230 pounds CPAP and also Transcend portable CPAP auto 8-15     Sleep apnea 1977   uses C-pap     Surgical History: Past Surgical History:  Procedure Laterality Date   ABDOMINAL SURGERY     ruptured intestines    CATARACT EXTRACTION W/PHACO Left 01/29/2016   Procedure: CATARACT EXTRACTION PHACO AND INTRAOCULAR LENS PLACEMENT ([facility]esar [facility]) left eye;  Surgeon: Cesar Koyanagi, MD;  Location: [facility];  Service:  Ophthalmology;  Laterality: Left;  RESTOR LENS   CATARACT EXTRACTION W/PHACO Right 10/21/2016   Procedure: CATARACT EXTRACTION PHACO AND INTRAOCULAR LENS PLACEMENT ([facility])  Right restor toric lens;  Surgeon: Cesar Koyanagi, MD;  Location: [facility];  Service: Ophthalmology;  Laterality: Right;  restor toric lens   CORONARY/GRAFT ACUTE MI REVASCULARIZATION N/A 05/13/2021   Procedure: Coronary/Graft Acute MI Revascularization;  Surgeon: Cesar Bush, MD;  [facility]: [name];  Service: Cardiovascular;  Laterality: N/A;   CYSTOSCOPY WITH LITHOLAPAXY N/A 08/14/2016   Procedure: CYSTOSCOPY WITH LITHOLAPAXY;  Surgeon: Cesar Retort, MD;  Location: [facility];  Service: Urology;  Laterality: N/A;   FEMUR IM NAIL Right 10/08/2014   Procedure: INTRAMEDULLARY (IM) NAIL FEMORAL;  Surgeon: Cesar Can, MD;  Location: [facility];  Service: Orthopedics;  Laterality: Right;  Cesar Powell 110 MIN   HOLMIUM LASER APPLICATION  5/73/2202   Procedure: HOLMIUM LASER APPLICATION;  Surgeon: Cesar Retort, MD;  Location: [facility];  Service: Urology;;   LEFT HEART CATH AND CORONARY ANGIOGRAPHY N/A 05/13/2021   Procedure: LEFT HEART CATH AND CORONARY ANGIOGRAPHY;  Surgeon: Cesar Bush, MD;  Location: [facility];  Service: Cardiovascular;  Laterality: N/A;   OTHER SURGICAL HISTORY  2006   Prostate Radiation Surgery    RADIOACTIVE SEED IMPLANT N/A 10/30/2019   Procedure: RADIOACTIVE SEED IMPLANT/BRACHYTHERAPY IMPLANT;  Surgeon: Cesar Co, MD;  Location: [facility];  Service: Urology;  Laterality: N/A;  41 seeds implanted    Home Medications:  Allergies as of 06/30/2021       Reactions   Gluten Meal Rash        Medication List        Accurate as of June 30, 2021  9:35 AM. If you have any questions, ask your nurse or doctor.          ascorbic acid 500 MG tablet Commonly known as: VITAMIN C Take 1,000 mg by mouth daily.   aspirin 81 MG chewable  tablet Chew 1 tablet (81 mg total) by mouth daily.   atorvastatin 80 MG tablet Commonly known as: LIPITOR Take 1 tablet (80 mg total) by mouth daily.   cephALEXin 500 MG capsule Commonly known as: Keflex Take 1 capsule (500 mg total) by mouth 2 (two) times daily.   losartan 25 MG tablet Commonly known as: COZAAR Take 0.5 tablets (12.5 mg total) by mouth daily.   nitroGLYCERIN 0.4 MG SL tablet Commonly known as: NITROSTAT Place 1 tablet (0.4 mg total) under the tongue every 5 (five) minutes as needed for chest pain.   PROBIOTIC PO Take 1 capsule by mouth daily.   tamsulosin 0.4 MG Caps capsule Commonly known as: FLOMAX Take 1 capsule (0.4 mg total) by mouth daily after supper.   ticagrelor 90 MG Tabs tablet Commonly known as: BRILINTA Take 1 tablet (90 mg total) by mouth 2 (two) times daily.   Vitamin D3 50 MCG (2000 UT) Tabs Take 2,000 Units by mouth daily.   ZINC PO Take 1 tablet by mouth daily.        Allergies:  Allergies  Allergen Reactions   Gluten Meal Rash    Family History: Family History  Problem Relation Age of Onset   Heart disease Brother    Heart attack Brother    Lung cancer Brother    Prostate cancer Neg Hx    Bladder Cancer Neg Hx    Kidney cancer Neg Hx     Social History:  reports that he quit smoking about 55 years ago. His smoking use included cigarettes. He has a 15.00 pack-year smoking history. He has never used smokeless tobacco. He reports that he does not currently use alcohol. He reports that he does not use drugs.  ROS:                                        Physical Exam: There were no vitals taken for this visit.  Constitutional:  Alert and oriented, No acute distress. HEENT: [facility] Powell AT, moist mucus membranes.  Trachea midline, no masses.  Laboratory Data: Powell Results  Component Value Date   WBC 6.6 06/06/2021   HGB 8.1 (L) 06/06/2021   HCT 25.5 (L) 06/06/2021   MCV 89 06/06/2021   PLT 260  06/06/2021    Powell Results  Component Value Date   CREATININE 1.04 05/22/2021    Powell Results  Component Value Date   PSA 3.86 06/12/2020   PSA 0.47 10/21/2015   PSA 0.32 08/26/2009    Powell Results  Component Value Date   TESTOSTERONE <3 (L) 11/06/2019    Powell Results  Component Value Date   HGBA1C 5.7 (H) 05/14/2021    Urinalysis    Component Value Date/Time   COLORURINE YELLOW 04/23/2021 0455   APPEARANCEUR CLEAR 04/23/2021 0455   APPEARANCEUR Cloudy (A) 07/23/2016 1132  LABSPEC 1.015 04/23/2021 0455   PHURINE 6.0 04/23/2021 0455   GLUCOSEU NEGATIVE 04/23/2021 0455   HGBUR TRACE (A) 04/23/2021 0455   HGBUR negative 06/13/2007 1105   BILIRUBINUR NEGATIVE 04/23/2021 0455   BILIRUBINUR Negative 07/23/2016 1132   KETONESUR NEGATIVE 04/23/2021 0455   PROTEINUR 30 (A) 04/23/2021 0455   UROBILINOGEN 0.2 06/01/2016 0953   UROBILINOGEN negative 06/13/2007 1105   NITRITE NEGATIVE 04/23/2021 0455   LEUKOCYTESUR TRACE (A) 04/23/2021 0455    Pertinent Imaging:   Assessment & Powell: Patient will come back in 1 week and be taught to self cath.  We will try to find one with more coud-tip.  We will watch his technique.  Call if urine culture positive.  Likely will be colonized urine with strong smelling  1. Urinary retention  - Bladder Scan (Post Void Residual) in office   No follow-ups on file.  Reece Packer, MD  [facility] [address] [place], [place] [address] [phone]

## 2021-07-01 ENCOUNTER — Ambulatory Visit (INDEPENDENT_AMBULATORY_CARE_PROVIDER_SITE_OTHER): Payer: Medicare Other | Admitting: Physician Assistant

## 2021-07-01 DIAGNOSIS — R339 Retention of urine, unspecified: Secondary | ICD-10-CM | POA: Diagnosis not present

## 2021-07-01 DIAGNOSIS — M9905 Segmental and somatic dysfunction of pelvic region: Secondary | ICD-10-CM | POA: Diagnosis not present

## 2021-07-01 DIAGNOSIS — M5136 Other intervertebral disc degeneration, lumbar region: Secondary | ICD-10-CM | POA: Diagnosis not present

## 2021-07-01 DIAGNOSIS — Z87440 Personal history of urinary (tract) infections: Secondary | ICD-10-CM

## 2021-07-01 DIAGNOSIS — M9903 Segmental and somatic dysfunction of lumbar region: Secondary | ICD-10-CM | POA: Diagnosis not present

## 2021-07-01 DIAGNOSIS — M62830 Muscle spasm of back: Secondary | ICD-10-CM | POA: Diagnosis not present

## 2021-07-01 MED ORDER — CEPHALEXIN 500 MG PO CAPS: 500.0000 mg | ORAL_CAPSULE | Freq: Two times a day (BID) | ORAL | 0 refills | Status: DC

## 2021-07-01 MED ORDER — CEFTRIAXONE SODIUM 1 G IJ SOLR
1.0000 g | Freq: Once | INTRAMUSCULAR | Status: AC
  Administered 2021-07-01: 1 g via INTRAMUSCULAR

## 2021-07-01 NOTE — Patient Instructions (Signed)
You received one shot of Rocephin in clinic today for UTI prevention. I'm prescribing you an additional 6 days of antibiotics today, which I would like you to start tomorrow morning (Wednesday).

## 2021-07-01 NOTE — Progress Notes (Signed)
IM Injection  Patient is present today for an IM Injection for UTI prevention  Drug: Ceftriaxone Dose:1g Location:Right upper outer quadrant Lot: [id number] Exp:09/2023 Patient tolerated well, no complications were noted.  Performed by: Bradly Bienenstock CMA

## 2021-07-01 NOTE — Progress Notes (Signed)
Patient returned to clinic this morning with reports that his Foley catheter fell out yesterday evening. He attempted to replace the catheter, but it slid back out. He is wearing his catheter today; on exam, it appears the balloon ruptured. The balloon is intact with no pieces missing.  Foley catheter replaced in clinic today. I was unable to replace the 51WC silicone coude he had in place due to resistance met at the level of the prostate consistent with false passage. Zara Council, PA-C, was able to place a 58NI silicone coude with no difficulty.  Patient had some urethral bleeding with insertion that quickly resolved. His urine was rather cloudy to purulent with bladder drainage; urine culture from yesterday is pending. Given significant urethral manipulation in the last day and concern regarding non-sterile technique for catheter reinsertion by the patient last night, we administered 1 dose of IM Rocephin in clinic today and will treat with 6 days of Keflex. Patient declined Bactrim today because he was previously informed that it was the course of AKI during admission in September 2021; however per chart review it appears he was also in urinary retention at that time.  Simple Catheter Placement  Due to urinary retention patient is present today for a foley cath placement.  Patient was cleaned and prepped in a sterile fashion with betadine. A 16 FR silicone coude foley catheter was inserted, urine return was noted  430m, urine was yellow in color.  The balloon was filled with 10cc of sterile water.  A leg bag was attached for drainage. Patient tolerated well, no complications were noted.  Performed by: SZara Council PA-C and SDebroah Loop PA-C   Additional notes/ Follow up: Return if symptoms worsen or fail to improve.    I spent 15 minutes on the day of the encounter to include pre-visit record review, face-to-face time with the patient, and post-visit ordering of tests.

## 2021-07-02 ENCOUNTER — Ambulatory Visit: Payer: Medicare Other | Admitting: Physician Assistant

## 2021-07-03 ENCOUNTER — Other Ambulatory Visit: Payer: Self-pay

## 2021-07-03 ENCOUNTER — Ambulatory Visit (INDEPENDENT_AMBULATORY_CARE_PROVIDER_SITE_OTHER): Payer: Medicare Other | Admitting: Physician Assistant

## 2021-07-03 DIAGNOSIS — T83011A Breakdown (mechanical) of indwelling urethral catheter, initial encounter: Secondary | ICD-10-CM

## 2021-07-03 LAB — CULTURE, URINE COMPREHENSIVE

## 2021-07-03 NOTE — Progress Notes (Signed)
Patient presented to clinic today with reports of his catheter having spontaneously fallen out again.  I saw him in clinic 2 days ago for the same, at which time I noted that his 27 [place] silicone coud catheter balloon had ruptured.  Today, I was able to note that there is a pinpoint hole in the balloon of the 16 French silicone coud catheter that I placed at his last visit.  Patient underwent cystoscopy with Dr. Diamantina Providence less than 1 month ago with no significant findings.  We suspect this may represent a manufacturing error.  I attempted to place several nonsilicone Foley catheters on the patient today, however unfortunately all of them snagged on the patient's supposed false passage and I was only able to place them successfully.  Ultimately, I was able to replace a 16 [place] silicone coud catheter with no difficulty, see procedure note below.  Notably, I inflated the balloon prior to insertion to verify that it was intact and there were no leaks or ruptures noted.  I deflated the balloon for insertion.  Simple Catheter Placement  Due to urinary retention patient is present today for a foley cath placement.  Patient was cleaned and prepped in a sterile fashion with betadine and 2% lidocaine jelly was instilled into the urethra. A 16 FR silicone coude foley catheter was inserted, urine return was noted  228ml, urine was yellow in color.  The balloon was filled with 10cc of sterile water.  A leg bag was attached for drainage.   Patient tolerated well, no complications were noted   Performed by: Debroah Loop, PA-C   Additional notes/ Follow up: Next week with Dr. Diamantina Providence as scheduled

## 2021-07-04 ENCOUNTER — Ambulatory Visit (INDEPENDENT_AMBULATORY_CARE_PROVIDER_SITE_OTHER): Payer: Medicare Other | Admitting: Physician Assistant

## 2021-07-04 ENCOUNTER — Telehealth: Payer: Self-pay

## 2021-07-04 DIAGNOSIS — T83011D Breakdown (mechanical) of indwelling urethral catheter, subsequent encounter: Secondary | ICD-10-CM

## 2021-07-04 MED ORDER — CIPROFLOXACIN HCL 250 MG PO TABS: 250.0000 mg | ORAL_TABLET | Freq: Two times a day (BID) | ORAL | 0 refills | Status: AC

## 2021-07-04 NOTE — Telephone Encounter (Signed)
Sent cipro to pharm. Pt will DC keflex

## 2021-07-04 NOTE — Progress Notes (Signed)
Patient returns to clinic this morning with reports of his catheter having spontaneously fallen out again.  This represents the third such incident this week with various sizes of silicone coud catheters.  Unfortunately, I have attempted but am unable to advance non-silicone coud catheters into the patient's bladder, meeting resistance at the level of the prostate with each attempt. Patient presents the catheter again today, which reveals a slit in the balloon. Patient is unable to CIC using his single use catheters at home, however he is able to advance the 44YJ coude silicone catheters into his bladder.  We discussed various options at this point today including suprapubic catheter placement with IR versus Foley catheter placement under cystoscopy in clinic versus resuming CIC with the 85UD silicone coude catheter and washing it thoroughly between use.  Patient does not have a driver today and is on aspirin and Brilinta after his recent heart attack, so SPT today is infeasible. Furthermore, he is hesitant to pursue suprapubic catheterization, though we discussed this may be recommended for him in the future if he continues to have bladder management issues. Ultimately he wishes to proceed with CIC using 14HF silicone catheters.  I provided the patient with a catheter today, lubrication packets, and cleaning wipes. I counseled him to wash the catheter with soap and water between uses and allow it to try prior to reinsertion. Patient expressed understanding. I also informed him of his abx switch per Dr. Matilde Sprang. Patient will follow up with Dr. Diamantina Providence next week to discuss bladder management further.  Debroah Loop, PA-C 07/04/21 10:30 AM  I spent 45 minutes on the day of the encounter to include pre-visit record review, face-to-face time with the patient, coordination of care, and post-visit ordering of tests.

## 2021-07-07 ENCOUNTER — Ambulatory Visit (INDEPENDENT_AMBULATORY_CARE_PROVIDER_SITE_OTHER): Payer: Medicare Other | Admitting: Urology

## 2021-07-07 ENCOUNTER — Encounter: Payer: Self-pay | Admitting: Urology

## 2021-07-07 ENCOUNTER — Ambulatory Visit: Payer: Medicare Other | Admitting: Physician Assistant

## 2021-07-07 ENCOUNTER — Other Ambulatory Visit: Payer: Self-pay

## 2021-07-07 VITALS — BP 135/56 | HR 79 | Ht 67.0 in | Wt 212.0 lb

## 2021-07-07 DIAGNOSIS — C61 Malignant neoplasm of prostate: Secondary | ICD-10-CM | POA: Diagnosis not present

## 2021-07-07 DIAGNOSIS — N312 Flaccid neuropathic bladder, not elsewhere classified: Secondary | ICD-10-CM

## 2021-07-07 NOTE — Progress Notes (Signed)
   07/07/2021 4:52 PM   Cesar Powell Feb 19, 1931 [id number]  Reason for visit: Follow up metastatic prostate cancer, urinary retention  HPI: Complex 87 year old male with metastatic prostate cancer who has deferred ADT. Originally treated with radiation in 2005, and was on lupron for biochemical recurrence prior to discontinuing secondary to Powell effects. He has a history of cystolitholopaxy by Dr Pilar Jarvis in 2018. He also underwent brachytherapy and XRT to abdominal nodes in 2021 with Dr Baruch Gouty.   Recently developed recurrent urinary retention, managed by multiple on call urology providers, unclear from notes if urethral stricture vs BNC vs prostate obstruction.  He underwent a cystoscopy with me on 06/05/2021 that showed slightly shaggy appearance of the prostatic urethra consistent with prior radiation, but no evidence of stricture and the cystoscope passed easily into the bladder without any resistance.  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  He was started on intermittent catheterization at that time and was originally doing well.  He has had multiple visits over the last month with our PA for either inability to pass the catheter, or catheter was replaced in clinic and has been falling out lately.  I suspect this may be a balloon defect in the [employer] catheters that were being used.  He currently is catheterizing 3-4 times per day with a 14 [place] Coloplast catheter.  He reports this is going very well and he is not having any problems passing the catheter into the bladder, not having any resistance or bleeding.  We again reviewed bladder management options including chronic urethral Foley, suprapubic tube, intermittent catheterization, or an attempt at urethral balloon dilation with optilume.  He cannot come off anticoagulation secondary to recent cardiac stent placement.  I think balloon dilation would be unlikely to help him  resume spontaneously voiding, as I am able to easily pass a cystoscope into the bladder without any evidence of stricture, but this could potentially help facilitate ease of passage for CIC.  Urodynamics may be another option in the future as well for better evaluation of the bladder.  He would like to continue CIC for now since things are going well, and it sounds like [employer] is also sending him a few different types of catheters to try in the future.  Continue intermittent catheterization 3-4 times daily with 14 French Coloplast catheter Virtual visit mid January to discussed options and reconsider optilume urethral balloon dilation or urodynamics  I spent 30 total minutes on the day of the encounter including pre-visit review of the medical record, face-to-face time with the patient, and post visit ordering of labs/imaging/tests.   Billey Co, [facility] [address] [place], [place] [address] [phone]

## 2021-07-09 ENCOUNTER — Ambulatory Visit: Payer: Medicare Other | Admitting: Urology

## 2021-07-11 ENCOUNTER — Telehealth: Payer: Self-pay | Admitting: *Deleted

## 2021-07-11 NOTE — Telephone Encounter (Signed)
Pt calling asking for new order for catheters pt is no longer using the condom cath. Due to the " sticky stuff" irritating his skin.  Pt states he is cathing 6 times daily, pt states he uses 3 caths at night, because "he generates a lot of urine at night" Pt got cath sample from [facility] and per pt "it goes in easy"

## 2021-07-15 NOTE — Telephone Encounter (Signed)
Okay to fill new Coloplast catheters that are working well for him(I am not sure which ones he decided on).  He should catheterize 3-4 times per day, decrease fluids 3 to 4 hours before bedtime and catheterize before going to bed.  He can catheterize more if needed if feeling full or starting to leak.  Keep follow-up as scheduled in January  Kerriann Kamphuis, MD 07/15/2021

## 2021-07-16 NOTE — Telephone Encounter (Signed)
Called pt he states that he has already spoken with [employer] and they are shipping the correct catheters and he expects them to arrive in the next 2 days. Nothing patient needs from [place] at this time.

## 2021-07-29 DIAGNOSIS — M5136 Other intervertebral disc degeneration, lumbar region: Secondary | ICD-10-CM | POA: Diagnosis not present

## 2021-07-29 DIAGNOSIS — M62830 Muscle spasm of back: Secondary | ICD-10-CM | POA: Diagnosis not present

## 2021-07-29 DIAGNOSIS — M9905 Segmental and somatic dysfunction of pelvic region: Secondary | ICD-10-CM | POA: Diagnosis not present

## 2021-07-29 DIAGNOSIS — M9903 Segmental and somatic dysfunction of lumbar region: Secondary | ICD-10-CM | POA: Diagnosis not present

## 2021-08-05 ENCOUNTER — Other Ambulatory Visit: Payer: Self-pay

## 2021-08-05 ENCOUNTER — Ambulatory Visit (INDEPENDENT_AMBULATORY_CARE_PROVIDER_SITE_OTHER): Payer: Medicare Other | Admitting: Urology

## 2021-08-05 DIAGNOSIS — R32 Unspecified urinary incontinence: Secondary | ICD-10-CM | POA: Diagnosis not present

## 2021-08-05 DIAGNOSIS — C61 Malignant neoplasm of prostate: Secondary | ICD-10-CM | POA: Diagnosis not present

## 2021-08-05 NOTE — Progress Notes (Signed)
Virtual Visit via Telephone Note  I connected with [name] on 08/05/21 at  8:30 AM EST by telephone and verified that I am speaking with the correct person using two identifiers.   Patient location: Home Provider location: [facility]   I discussed the limitations, risks, security and privacy concerns of performing an evaluation and management service by telephone and the availability of in person appointments. We discussed the impact of the COVID-19 pandemic on the healthcare system, and the importance of social distancing and reducing patient and provider exposure. I also discussed with the patient that there may be a patient responsible charge related to this service. The patient expressed understanding and agreed to proceed.  Reason for visit:   History of Present Illness: Complex 88 year old male with metastatic prostate cancer who has deferred ADT. Originally treated with radiation in 2005, and was on lupron for biochemical recurrence prior to discontinuing secondary to side effects. He has a history of cystolitholopaxy by Dr Pilar Jarvis in 2018. He also underwent brachytherapy and XRT to abdominal nodes in 2021 with Dr Baruch Gouty.   Recently developed recurrent urinary retention, managed by multiple on call urology providers, unclear from notes if urethral stricture vs BNC vs prostate obstruction.   He underwent a cystoscopy with me on 06/05/2021 that showed slightly shaggy appearance of the prostatic urethra consistent with prior radiation, but no evidence of stricture and the cystoscope passed easily into the bladder without any resistance.  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  He was started on intermittent catheterization at that time and was originally doing well.  He ultimately changed over to CIC 3-4 times a day with a [place] catheter and was doing relatively well, however over the last few  weeks he reports worsening incontinence.  He is able to pass the catheter, but the bladder is empty after he leaks.  He is wearing multiple depends throughout the day, and overnight.  He does still get the urge to void and is able to urinate with a slow stream initially that then improves.  I recommended trying a Cunningham clamp for his incontinence.  We discussed the role of urodynamics, and I would recommend considering that in the next month or 2 if he continues to have significant leakage.  We discussed the complexities of his case with likely longstanding bladder outlet obstruction/possible atonic bladder, radiation and prostate cancer   Follow Up: Trial of Cunningham clamp Okay to catheterize as needed Consider urodynamics if no improvement with above   I discussed the assessment and treatment plan with the patient. The patient was provided an opportunity to ask questions and all were answered. The patient agreed with the plan and demonstrated an understanding of the instructions.   The patient was advised to call back or seek an in-person evaluation if the symptoms worsen or if the condition fails to improve as anticipated.  I provided 13 minutes of non-face-to-face time during this encounter.   Billey Co, MD

## 2021-08-06 ENCOUNTER — Other Ambulatory Visit: Payer: Self-pay | Admitting: *Deleted

## 2021-08-06 DIAGNOSIS — D649 Anemia, unspecified: Secondary | ICD-10-CM

## 2021-08-07 ENCOUNTER — Telehealth: Payer: Medicare Other | Admitting: Urology

## 2021-08-10 ENCOUNTER — Other Ambulatory Visit: Payer: Self-pay | Admitting: *Deleted

## 2021-08-10 DIAGNOSIS — C61 Malignant neoplasm of prostate: Secondary | ICD-10-CM

## 2021-08-10 DIAGNOSIS — D649 Anemia, unspecified: Secondary | ICD-10-CM

## 2021-08-11 ENCOUNTER — Inpatient Hospital Stay (HOSPITAL_BASED_OUTPATIENT_CLINIC_OR_DEPARTMENT_OTHER): Payer: Medicare Other | Admitting: Oncology

## 2021-08-11 ENCOUNTER — Inpatient Hospital Stay: Payer: Medicare Other | Attending: Oncology

## 2021-08-11 ENCOUNTER — Other Ambulatory Visit: Payer: Self-pay

## 2021-08-11 ENCOUNTER — Encounter: Payer: Self-pay | Admitting: Oncology

## 2021-08-11 VITALS — BP 156/64 | HR 58 | Temp 95.7°F | Resp 16 | Ht 67.0 in | Wt 215.0 lb

## 2021-08-11 DIAGNOSIS — Z923 Personal history of irradiation: Secondary | ICD-10-CM | POA: Diagnosis not present

## 2021-08-11 DIAGNOSIS — Z79899 Other long term (current) drug therapy: Secondary | ICD-10-CM | POA: Diagnosis not present

## 2021-08-11 DIAGNOSIS — C61 Malignant neoplasm of prostate: Secondary | ICD-10-CM | POA: Diagnosis not present

## 2021-08-11 DIAGNOSIS — Z87891 Personal history of nicotine dependence: Secondary | ICD-10-CM | POA: Insufficient documentation

## 2021-08-11 DIAGNOSIS — D649 Anemia, unspecified: Secondary | ICD-10-CM | POA: Insufficient documentation

## 2021-08-11 DIAGNOSIS — I252 Old myocardial infarction: Secondary | ICD-10-CM | POA: Insufficient documentation

## 2021-08-11 LAB — CBC WITH DIFFERENTIAL/PLATELET
Abs Immature Granulocytes: 0.02 10*3/uL (ref 0.00–0.07)
Basophils Absolute: 0 10*3/uL (ref 0.0–0.1)
Basophils Relative: 1 %
Eosinophils Absolute: 0.2 10*3/uL (ref 0.0–0.5)
Eosinophils Relative: 4 %
HCT: 27.8 % — ABNORMAL LOW (ref 39.0–52.0)
Hemoglobin: 8.7 g/dL — ABNORMAL LOW (ref 13.0–17.0)
Immature Granulocytes: 0 %
Lymphocytes Relative: 10 %
Lymphs Abs: 0.5 10*3/uL — ABNORMAL LOW (ref 0.7–4.0)
MCH: 28.6 pg (ref 26.0–34.0)
MCHC: 31.3 g/dL (ref 30.0–36.0)
MCV: 91.4 fL (ref 80.0–100.0)
Monocytes Absolute: 0.5 10*3/uL (ref 0.1–1.0)
Monocytes Relative: 10 %
Neutro Abs: 4.2 10*3/uL (ref 1.7–7.7)
Neutrophils Relative %: 75 %
Platelets: 273 10*3/uL (ref 150–400)
RBC: 3.04 MIL/uL — ABNORMAL LOW (ref 4.22–5.81)
RDW: 15.9 % — ABNORMAL HIGH (ref 11.5–15.5)
WBC: 5.6 10*3/uL (ref 4.0–10.5)
nRBC: 0 % (ref 0.0–0.2)

## 2021-08-11 LAB — COMPREHENSIVE METABOLIC PANEL
ALT: 12 U/L (ref 0–44)
AST: 13 U/L — ABNORMAL LOW (ref 15–41)
Albumin: 3.8 g/dL (ref 3.5–5.0)
Alkaline Phosphatase: 53 U/L (ref 38–126)
Anion gap: 6 (ref 5–15)
BUN: 25 mg/dL — ABNORMAL HIGH (ref 8–23)
CO2: 27 mmol/L (ref 22–32)
Calcium: 8.7 mg/dL — ABNORMAL LOW (ref 8.9–10.3)
Chloride: 102 mmol/L (ref 98–111)
Creatinine, Ser: 1.03 mg/dL (ref 0.61–1.24)
GFR, Estimated: >60 mL/min (ref >60)
Glucose, Bld: 116 mg/dL — ABNORMAL HIGH (ref 70–99)
Potassium: 4.6 mmol/L (ref 3.5–5.1)
Sodium: 135 mmol/L (ref 135–145)
Total Bilirubin: 0.4 mg/dL (ref 0.3–1.2)
Total Protein: 7.3 g/dL (ref 6.5–8.1)

## 2021-08-11 LAB — IRON AND TIBC
Iron: 30 ug/dL — ABNORMAL LOW (ref 45–182)
Saturation Ratios: 11 % — ABNORMAL LOW (ref 17.9–39.5)
TIBC: 269 ug/dL (ref 250–450)
UIBC: 239 ug/dL

## 2021-08-11 LAB — FERRITIN: Ferritin: 238 ng/mL (ref 24–336)

## 2021-08-11 LAB — PSA: Prostatic Specific Antigen: 12.06 ng/mL — ABNORMAL HIGH (ref 0.00–4.00)

## 2021-08-11 NOTE — Progress Notes (Signed)
Pt doing ok, he has urinary issues. Can't urinate at times and he caths him self. He is going to talk to his urologist.

## 2021-08-11 NOTE — Progress Notes (Signed)
Hematology/Oncology Consult note [facility]  [phone] [phone]  Patient Care Team: Owens Loffler, MD as PCP - General Leandrew Koyanagi, MD as Referring Physician (Ophthalmology) Nickie Retort, MD (Inactive) as Consulting Physician (Urology) Noreene Filbert, MD as Radiation Oncologist (Radiation Oncology)   Name of the patient: Cesar Powell  [id number]  June 20, 1931   Date of visit: 08/11/21  Diagnosis- castrate resistant metastatic prostate cancer  Chief complaint/ Reason for visit-routine follow-up visit of prostate cancer normocytic anemia  Heme/Onc history:  patient is a 88 year old gentleman with no significant past medical problems.  He has a history of prostate cancer that was diagnosed and treated with radiation in 2005.  At that point he his PSA was about 0.03 for a long time.  Then started on Axiron for low testosterone.  PSA eventually went up to as high as 18 2015.  At that point he was started on Lupron his PSA doubling time is around 10 months. Most recently since October 2018 after his PSA went down from 3.6-1.7 and has been gradually increasing to 1.3,1.5 and 2 indicating a PSA doubling time of 9.9 months.  Testosterone levels continue to be suppressed.  He has not started oral anti androgen therapy yet.    patient was last seen by me in September 2019 discussed adding oral antiandrogens like apalutamide versus enzalutamide to Lupron in castrate resistant nonmetastatic prostate cancer.  After discussing risks and benefits patient did not wish to proceed.Patient stopped lupron in feb 2020 but did restart after 1 year in February 2021.  Patient received IMRT for his pelvic lymph nodes.    Interval history-patient had STEMI in October 2022.  States that presently he is not using any of his anticoagulants.  He has chronic fatigue.  He is slowly trying to get back to his baseline level of activity.  ECOG PS-  1 Pain scale- 2  Review of systems- Review of Systems  Constitutional:  Positive for malaise/fatigue. Negative for chills, fever and weight loss.  HENT:  Negative for congestion, ear discharge and nosebleeds.   Eyes:  Negative for blurred vision.  Respiratory:  Negative for cough, hemoptysis, sputum production, shortness of breath and wheezing.   Cardiovascular:  Negative for chest pain, palpitations, orthopnea and claudication.  Gastrointestinal:  Negative for abdominal pain, blood in stool, constipation, diarrhea, heartburn, melena, nausea and vomiting.  Genitourinary:  Negative for dysuria, flank pain, frequency, hematuria and urgency.  Musculoskeletal:  Negative for back pain, joint pain and myalgias.  Skin:  Negative for rash.  Neurological:  Negative for dizziness, tingling, focal weakness, seizures, weakness and headaches.  Endo/Heme/Allergies:  Does not bruise/bleed easily.  Psychiatric/Behavioral:  Negative for depression and suicidal ideas. The patient does not have insomnia.      Allergies  Allergen Reactions   Gluten Meal Rash     Past Medical History:  Diagnosis Date   H/O prostate cancer    was treated with radiation   Hypertension    Local recurrence of prostate cancer ([place]) 06/21/2004   Qualifier: Diagnosis of  By: Fuller Plan CMA (AAMA), [place]     Obstructive sleep apnea 06/22/2007   NPSG [place] 1979 Unattended Home sleep Study- 05/10/14- Confirms severe OSA, AHI 41.8/ hr, weight 230 pounds CPAP and also Transcend portable CPAP auto 8-15     Sleep apnea 1977   uses C-pap      Past Surgical History:  Procedure Laterality Date   ABDOMINAL SURGERY  ruptured intestines    CATARACT EXTRACTION W/PHACO Left 01/29/2016   Procedure: CATARACT EXTRACTION PHACO AND INTRAOCULAR LENS PLACEMENT ([place]) left eye;  Surgeon: Leandrew Koyanagi, MD;  Location: [place];  Service: Ophthalmology;  Laterality: Left;  RESTOR LENS   CATARACT EXTRACTION W/PHACO Right  10/21/2016   Procedure: CATARACT EXTRACTION PHACO AND INTRAOCULAR LENS PLACEMENT ([facility])  Right restor toric lens;  Surgeon: Leandrew Koyanagi, MD;  Location: [place];  Service: Ophthalmology;  Laterality: Right;  restor toric lens   CORONARY/GRAFT ACUTE MI REVASCULARIZATION N/A 05/13/2021   Procedure: Coronary/Graft Acute MI Revascularization;  Surgeon: Nelva Bush, MD;  Location: [place] [facility];  Service: Cardiovascular;  Laterality: N/A;   CYSTOSCOPY WITH LITHOLAPAXY N/A 08/14/2016   Procedure: CYSTOSCOPY WITH LITHOLAPAXY;  Surgeon: Nickie Retort, MD;  Location: [facility];  Service: Urology;  Laterality: N/A;   FEMUR IM NAIL Right 10/08/2014   Procedure: INTRAMEDULLARY (IM) NAIL FEMORAL;  Surgeon: Rod Can, MD;  Location: [place];  Service: Orthopedics;  Laterality: Right;  PER DANIELLE 110 MIN   HOLMIUM LASER APPLICATION  0/11/5995   Procedure: HOLMIUM LASER APPLICATION;  Surgeon: Nickie Retort, MD;  Location: [facility];  Service: Urology;;   LEFT HEART CATH AND CORONARY ANGIOGRAPHY N/A 05/13/2021   Procedure: LEFT HEART CATH AND CORONARY ANGIOGRAPHY;  Surgeon: Nelva Bush, MD;  Location: [place] [facility];  Service: Cardiovascular;  Laterality: N/A;   OTHER SURGICAL HISTORY  2006   Prostate Radiation Surgery    RADIOACTIVE SEED IMPLANT N/A 10/30/2019   Procedure: RADIOACTIVE SEED IMPLANT/BRACHYTHERAPY IMPLANT;  Surgeon: Billey Co, MD;  Location: [facility];  Service: Urology;  Laterality: N/A;  71 seeds implanted    Social History   Socioeconomic History   Marital status: Married    Spouse name: Not on file   Number of children: Not on file   Years of education: Not on file   Highest education level: Not on file  Occupational History   Occupation: retired  Tobacco Use   Smoking status: Former    Packs/day: 1.00    Years: 15.00    Pack years: 15.00    Types: Cigarettes    Quit date: 09/14/1965    Years since quitting: 55.9   Smokeless  tobacco: Never  Vaping Use   Vaping Use: Never used  Substance and Sexual Activity   Alcohol use: Not Currently    Comment: occassional   Drug use: No   Sexual activity: Yes    Birth control/protection: None  Other Topics Concern   Not on file  Social History Narrative   Not on file   Social Determinants of Health   Financial Resource Strain: Not on file  Food Insecurity: Not on file  Transportation Needs: Not on file  Physical Activity: Not on file  Stress: Not on file  Social Connections: Not on file  Intimate Partner Violence: Not on file    Family History  Problem Relation Age of Onset   Heart disease Brother    Heart attack Brother    Lung cancer Brother    Prostate cancer Neg Hx    Bladder Cancer Neg Hx    Kidney cancer Neg Hx      Current Outpatient Medications:    ascorbic acid (VITAMIN C) 500 MG tablet, Take 1,000 mg by mouth daily., Disp: , Rfl:    aspirin 81 MG chewable tablet, Chew 1 tablet (81 mg total) by mouth daily., Disp: , Rfl:    Cholecalciferol (  VITAMIN D3) 50 MCG (2000 UT) TABS, Take 2,000 Units by mouth daily., Disp: , Rfl:    losartan (COZAAR) 25 MG tablet, Take 0.5 tablets (12.5 mg total) by mouth daily., Disp: 30 tablet, Rfl: 0   Multiple Vitamins-Minerals (ZINC PO), Take 1 tablet by mouth daily., Disp: , Rfl:    nitroGLYCERIN (NITROSTAT) 0.4 MG SL tablet, Place 1 tablet (0.4 mg total) under the tongue every 5 (five) minutes as needed for chest pain., Disp: 25 tablet, Rfl: 2   Probiotic Product (PROBIOTIC PO), Take 1 capsule by mouth daily., Disp: , Rfl:    tamsulosin (FLOMAX) 0.4 MG CAPS capsule, Take 1 capsule (0.4 mg total) by mouth daily after supper., Disp: 180 capsule, Rfl: 3   atorvastatin (LIPITOR) 80 MG tablet, Take 1 tablet (80 mg total) by mouth daily. (Patient not taking: Reported on 06/30/2021), Disp: 30 tablet, Rfl: 0  Physical exam:  Vitals:   08/11/21 1130  BP: (!) 156/64  Pulse: (!) 58  Resp: 16  Temp: (!) 95.7 F (35.4  C)  TempSrc: Tympanic  Weight: 215 lb (97.5 kg)  Height: '5\' 7"'  (1.702 m)   Physical Exam Constitutional:      General: He is not in acute distress. Cardiovascular:     Rate and Rhythm: Normal rate and regular rhythm.     Heart sounds: Normal heart sounds.  Pulmonary:     Effort: Pulmonary effort is normal.     Breath sounds: Normal breath sounds.  Abdominal:     General: Bowel sounds are normal.     Palpations: Abdomen is soft.  Skin:    General: Skin is warm and dry.  Neurological:     Mental Status: He is alert and oriented to person, place, and time.     CMP Latest Ref Rng & Units 08/11/2021  Glucose 70 - 99 mg/dL 116(H)  BUN 8 - 23 mg/dL 25(H)  Creatinine 0.61 - 1.24 mg/dL 1.03  Sodium 135 - 145 mmol/L 135  Potassium 3.5 - 5.1 mmol/L 4.6  Chloride 98 - 111 mmol/L 102  CO2 22 - 32 mmol/L 27  Calcium 8.9 - 10.3 mg/dL 8.7(L)  Total Protein 6.5 - 8.1 g/dL 7.3  Total Bilirubin 0.3 - 1.2 mg/dL 0.4  Alkaline Phos 38 - 126 U/L 53  AST 15 - 41 U/L 13(L)  ALT 0 - 44 U/L 12   CBC Latest Ref Rng & Units 08/11/2021  WBC 4.0 - 10.5 K/uL 5.6  Hemoglobin 13.0 - 17.0 g/dL 8.7(L)  Hematocrit 39.0 - 52.0 % 27.8(L)  Platelets 150 - 400 K/uL 273     Assessment and plan- Patient is a 88 y.o. 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  He is here for routine follow-up  Prostate cancer: He is not currently receiving any ADT.  He did not wish to start on Lupron or Eligard given the side effects.His PSA is presently steadily going up.  It was not back at the time of my visit with the patient but did come back at 12 from prior value of 10.2.  His scans last year did not show any evidence of progressive disease.  I will see him back in 3 months with CBC CMP and PSA and discuss if he is willing to start any ADT at that time as well as get repeat scans  Normocytic anemia: Patient's hemoglobin has been presently between 8-9.  Peripheral blood anemia work-up  has been unremarkable and suggestive of anemia  of chronic disease.  We did discuss considering bone marrow biopsy to rule out primary bone marrow condition such as MDS which can cause anemia.  He would like to think about it but hold off on any bone marrow at this time.  I will see him back in 3 months with CBC ferritin iron studies   Visit Diagnosis 1. Normocytic anemia   2. Prostate cancer [facility])      Dr. Randa Evens, MD, MPH [facility] [phone] 08/11/2021 4:11 PM

## 2021-08-12 ENCOUNTER — Telehealth: Payer: Self-pay | Admitting: Medical

## 2021-08-12 ENCOUNTER — Other Ambulatory Visit: Payer: Self-pay

## 2021-08-12 ENCOUNTER — Other Ambulatory Visit: Payer: Self-pay | Admitting: Medical

## 2021-08-12 DIAGNOSIS — I25118 Atherosclerotic heart disease of native coronary artery with other forms of angina pectoris: Secondary | ICD-10-CM

## 2021-08-12 MED ORDER — LOSARTAN POTASSIUM 25 MG PO TABS: 12.5000 mg | ORAL_TABLET | Freq: Every day | ORAL | 0 refills | Status: DC

## 2021-08-12 NOTE — Telephone Encounter (Signed)
Patient is requesting refills on Brilinta and losartan.  Losartan has been refilled.   Looks like Brilinta was discontinued by another provider.  Can you please confirm if patient should be taking Brilinta.

## 2021-08-12 NOTE — Telephone Encounter (Signed)
°*  STAT* If patient is at the pharmacy, call can be transferred to refill team.   1. Which medications need to be refilled? (please list name of each medication and dose if known) cozaar and brilinta   2. Which pharmacy/location (including street and city if local pharmacy) is medication to be sent to?[facility] [place]  3. Do they need a 30 day or 90 day supply? 90   Patient has been out for a month .  Patient was told we denied a refill request but is aware there is no documentation of a previous request .

## 2021-08-12 NOTE — Progress Notes (Signed)
08/13/2021 9:12 PM   Cesar Powell 1930-08-18 [id number]  Referring provider: Owens Loffler, MD [address] [place],  [place] [address]  Chief Complaint  Patient presents with   Urinary Tract Infection   Urological history 1.Prostate cancer -PSA 12.06 08/2021 -Metastatic prostate cancer -Originally treated with radiation 2005 -ADT for biochemical recurrence but discontinued secondary to Powell effects -Brachytherapy and XRT to abdominal nodes in 2021 with Dr. Donella Stade  2. 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 Incontinence -managed with a Cunningham clamp  4. High risk hematuria -former smoker -CTU 2017 - NED -cysto 2017- NED -no reports of gross heme -UA 3-10 RBC's - but it was a CATH specimen   5. Bladder stone -CTU 2017 - bladder stone -cystolitholapaxy ~ bladder stone ~ 2 cm   6. Renal cyst -CTU 2017- right renal cyst 2.7 cm -RUS 2022 -multiple right renal cysts  HPI: Cesar Powell is a 88 y.o. male who presents today for possible UTI as he is experiencing smelly urine and back pain.  CATH UA 11-30 WBC's, 3-10 RBC's and few bacteria.    PVR 18 mL  He has stopped self cathing at this time as he states he just has continuous leakage.    PMH: Past Medical History:  Diagnosis Date   H/O prostate cancer    was treated with radiation   Hypertension    Local recurrence of prostate cancer ([place]) 06/21/2004   Qualifier: Diagnosis of  By: Fuller Plan CMA (AAMA), [place]     Obstructive sleep apnea 06/22/2007   NPSG [place] 1979 Unattended Home sleep Study- 05/10/14- Confirms severe OSA, AHI 41.8/ hr, weight 230 pounds CPAP and also Transcend portable CPAP auto 8-15     Sleep apnea 1977   uses C-pap     Surgical History: Past Surgical History:  Procedure Laterality Date   ABDOMINAL SURGERY     ruptured intestines    CATARACT EXTRACTION W/PHACO Left 01/29/2016    Procedure: CATARACT EXTRACTION PHACO AND INTRAOCULAR LENS PLACEMENT ([place]) left eye;  Surgeon: Leandrew Koyanagi, MD;  Location: [facility];  Service: Ophthalmology;  Laterality: Left;  RESTOR LENS   CATARACT EXTRACTION W/PHACO Right 10/21/2016   Procedure: CATARACT EXTRACTION PHACO AND INTRAOCULAR LENS PLACEMENT ([facility])  Right restor toric lens;  Surgeon: Leandrew Koyanagi, MD;  Location: [facility];  Service: Ophthalmology;  Laterality: Right;  restor toric lens   CORONARY/GRAFT ACUTE MI REVASCULARIZATION N/A 05/13/2021   Procedure: Coronary/Graft Acute MI Revascularization;  Surgeon: Nelva Bush, MD;  Location: [facility];  Service: Cardiovascular;  Laterality: N/A;   CYSTOSCOPY WITH LITHOLAPAXY N/A 08/14/2016   Procedure: CYSTOSCOPY WITH LITHOLAPAXY;  Surgeon: Nickie Retort, MD;  Location: [facility];  Service: Urology;  Laterality: N/A;   FEMUR IM NAIL Right 10/08/2014   Procedure: INTRAMEDULLARY (IM) NAIL FEMORAL;  Surgeon: Rod Can, MD;  Location: [facility];  Service: Orthopedics;  Laterality: Right;  PER DANIELLE 110 MIN   HOLMIUM LASER APPLICATION  0/34/7425   Procedure: HOLMIUM LASER APPLICATION;  Surgeon: Nickie Retort, MD;  Location: [facility];  Service: Urology;;   LEFT HEART CATH AND CORONARY ANGIOGRAPHY N/A 05/13/2021   Procedure: LEFT HEART CATH AND CORONARY ANGIOGRAPHY;  Surgeon: Nelva Bush, MD;  Location: [facility];  Service: Cardiovascular;  Laterality: N/A;   OTHER SURGICAL HISTORY  2006   Prostate Radiation Surgery    RADIOACTIVE SEED IMPLANT  N/A 10/30/2019   Procedure: RADIOACTIVE SEED IMPLANT/BRACHYTHERAPY IMPLANT;  Surgeon: Billey Co, MD;  Location: [facility];  Service: Urology;  Laterality: N/A;  41 seeds implanted    Home Medications:  Allergies as of 08/13/2021       Reactions   Gluten Meal Rash        Medication List        Accurate as of August 13, 2021 11:59 PM. If you have any questions, ask  your nurse or doctor.          ascorbic acid 500 MG tablet Commonly known as: VITAMIN C Take 1,000 mg by mouth daily.   aspirin 81 MG chewable tablet Chew 1 tablet (81 mg total) by mouth daily.   atorvastatin 80 MG tablet Commonly known as: LIPITOR Take 1 tablet (80 mg total) by mouth daily.   losartan 25 MG tablet Commonly known as: COZAAR Take 0.5 tablets (12.5 mg total) by mouth daily.   nitroGLYCERIN 0.4 MG SL tablet Commonly known as: NITROSTAT Place 1 tablet (0.4 mg total) under the tongue every 5 (five) minutes as needed for chest pain.   PROBIOTIC PO Take 1 capsule by mouth daily.   sulfamethoxazole-trimethoprim 800-160 MG tablet Commonly known as: BACTRIM DS Take 1 tablet by mouth every 12 (twelve) hours. Started by: Zara Council, PA-C   tamsulosin 0.4 MG Caps capsule Commonly known as: FLOMAX Take 1 capsule (0.4 mg total) by mouth daily after supper.   Vitamin D3 50 MCG (2000 UT) Tabs Take 2,000 Units by mouth daily.   ZINC PO Take 1 tablet by mouth daily.        Allergies:  Allergies  Allergen Reactions   Gluten Meal Rash    Family History: Family History  Problem Relation Age of Onset   Heart disease Brother    Heart attack Brother    Lung cancer Brother    Prostate cancer Neg Hx    Bladder Cancer Neg Hx    Kidney cancer Neg Hx     Social History:  reports that he quit smoking about 55 years ago. His smoking use included cigarettes. He has a 15.00 pack-year smoking history. He has never used smokeless tobacco. He reports that he does not currently use alcohol. He reports that he does not use drugs.  ROS: Pertinent ROS in HPI  Physical Exam: Constitutional:  Well nourished. Alert and oriented, No acute distress. HEENT: [place] AT, mask in place.  Trachea midline Cardiovascular: No clubbing, cyanosis, or edema. Respiratory: Normal respiratory effort, no increased work of breathing. GI: Abdomen is soft, non tender, non distended, no  abdominal masses. Liver and spleen not palpable.  No hernias appreciated.  Stool sample for occult testing is not indicated.   Neurologic: Grossly intact, no focal deficits, moving all 4 extremities. Psychiatric: Normal mood and affect.  Laboratory Data: Lab Results  Component Value Date   WBC 5.6 08/11/2021   HGB 8.7 (L) 08/11/2021   HCT 27.8 (L) 08/11/2021   MCV 91.4 08/11/2021   PLT 273 08/11/2021    Lab Results  Component Value Date   CREATININE 1.03 08/11/2021    Lab Results  Component Value Date   PSA 3.86 06/12/2020   PSA 0.47 10/21/2015   PSA 0.32 08/26/2009    Lab Results  Component Value Date   TESTOSTERONE <3 (L) 11/06/2019    Lab Results  Component Value Date   HGBA1C 5.7 (H) 05/14/2021    Lab Results  Component Value Date  TSH 6.122 (H) 05/14/2021       Component Value Date/Time   CHOL 150 05/14/2021 0433   HDL 49 05/14/2021 0433   CHOLHDL 3.1 05/14/2021 0433   VLDL 12 05/14/2021 0433   LDLCALC 89 05/14/2021 0433    Lab Results  Component Value Date   AST 13 (L) 08/11/2021   Lab Results  Component Value Date   ALT 12 08/11/2021    Urinalysis Component     Latest Ref Rng & Units 08/13/2021  Specific Gravity, UA     1.005 - 1.030 1.020  pH, UA     5.0 - 7.5 7.0  Color, UA     Yellow Yellow  Appearance Ur     Clear Cloudy (A)  Leukocytes,UA     Negative 1+ (A)  Protein,UA     Negative/Trace 2+ (A)  Glucose, UA     Negative Negative  Ketones, UA     Negative Negative  RBC, UA     Negative Trace (A)  Bilirubin, UA     Negative Negative  Urobilinogen, Ur     0.2 - 1.0 mg/dL 0.2  Nitrite, UA     Negative Negative  Microscopic Examination      See below:   Component     Latest Ref Rng & Units 08/13/2021          WBC, UA     0 - 5 /hpf 11-30 (A)  RBC     0 - 2 /hpf 3-10 (A)  Epithelial Cells (non renal)     0 - 10 /hpf 0-10  Casts     None seen /lpf Present (A)  Cast Type     N/A Granular casts (A)  Crystals      N/A Present (A)  Crystal Type     N/A Amorphous Sediment  Mucus, UA     Not Estab. Present (A)  Bacteria, UA     None seen/Few Few  I have reviewed the labs.   Pertinent Imaging: CLINICAL DATA:  Flank pain.  Suspect UTI.   EXAM: ABDOMEN - 1 VIEW   COMPARISON:  CT 04/02/2021   FINDINGS: Divided AP view of the abdomen obtained. No visualized radiopaque calculi. Calcification in the left mid abdomen corresponds to calcified mesenteric density on prior CT. Additional punctate calcifications in the left abdomen are also mesenteric. No bowel dilatation to suggest obstruction. Moderate stool in the ascending and transverse colon. No abnormal rectal distention. Brachytherapy pellets in the prostate. Multiple pelvic phleboliths. Surgical hardware in the right proximal femur. No acute osseous abnormalities are seen. Remote left eleventh rib fracture.   IMPRESSION: 1. No visualized radiopaque calculi. 2. Calcifications in the left abdomen correspond to calcified mesenteric densities on prior CT.     Electronically Signed   By: Keith Rake M.D.   On: 08/13/2021 16:08 I have independently reviewed the films.  See HPI.     Simple Catheter Placement Due to urinary retention patient is present today for a foley cath placement.  Patient was cleaned and prepped in a sterile fashion with betadine. A 16 FR Coude Silicone foley catheter was inserted, urine return was noted  20 ml, urine was yellow cloudy in color.  The balloon was filled with 10cc of sterile water.  A leg bag was attached for drainage. Patient was also given a night bag to take home and was given instruction on how to change from one bag to another.  Patient was given instruction on  proper catheter care.  Patient tolerated well, no complications were noted    Assessment & Plan:    1. Suspected UTI -urine with a very pungent foul order  -UA with pyuria and micro heme -urine sent for culture -Started on Septra DS, 1  tablet twice daily for 7 days  2. Urinary retention -Even though patient has not been self cathing and his PVR is minimal on today's visit, I left the coud catheter in today as he states on occasion he does have difficulty cathing if he should need to as he has very few of the appropriate catheters available to him  3. Incontinence -Patient will likely need urodynamics in the future pending urine culture results and if leakage improves after treatment of likely infection   Return for pending urine culture results .  These notes generated with voice recognition software. I apologize for typographical errors.  Zara Council, PA-C  [facility] [address]  [place], [place] [address] [phone]

## 2021-08-13 ENCOUNTER — Ambulatory Visit
Admission: RE | Admit: 2021-08-13 | Discharge: 2021-08-13 | Disposition: A | Discharge status: Home / Self Care | Payer: Medicare Other | Source: Ambulatory Visit | Attending: Urology | Admitting: Urology

## 2021-08-13 ENCOUNTER — Ambulatory Visit (INDEPENDENT_AMBULATORY_CARE_PROVIDER_SITE_OTHER): Payer: Medicare Other | Admitting: Urology

## 2021-08-13 ENCOUNTER — Other Ambulatory Visit: Payer: Self-pay

## 2021-08-13 ENCOUNTER — Ambulatory Visit
Admission: RE | Admit: 2021-08-13 | Discharge: 2021-08-13 | Disposition: A | Discharge status: Home / Self Care | Payer: Medicare Other | Attending: Urology | Admitting: Urology

## 2021-08-13 DIAGNOSIS — R339 Retention of urine, unspecified: Secondary | ICD-10-CM | POA: Diagnosis not present

## 2021-08-13 DIAGNOSIS — R32 Unspecified urinary incontinence: Secondary | ICD-10-CM | POA: Diagnosis not present

## 2021-08-13 DIAGNOSIS — R3989 Other symptoms and signs involving the genitourinary system: Secondary | ICD-10-CM

## 2021-08-13 DIAGNOSIS — R109 Unspecified abdominal pain: Secondary | ICD-10-CM | POA: Diagnosis not present

## 2021-08-13 LAB — BLADDER SCAN AMB NON-IMAGING

## 2021-08-13 MED ORDER — SULFAMETHOXAZOLE-TRIMETHOPRIM 800-160 MG PO TABS: 1.0000 | ORAL_TABLET | Freq: Two times a day (BID) | ORAL | 0 refills | Status: DC

## 2021-08-14 LAB — MICROSCOPIC EXAMINATION

## 2021-08-14 LAB — URINALYSIS, COMPLETE
Bilirubin, UA: NEGATIVE
Glucose, UA: NEGATIVE
Ketones, UA: NEGATIVE
Nitrite, UA: NEGATIVE
Specific Gravity, UA: 1.02 (ref 1.005–1.030)
Urobilinogen, Ur: 0.2 mg/dL (ref 0.2–1.0)
pH, UA: 7 (ref 5.0–7.5)

## 2021-08-17 LAB — CULTURE, URINE COMPREHENSIVE

## 2021-08-21 ENCOUNTER — Encounter: Payer: Self-pay | Admitting: Emergency Medicine

## 2021-08-21 ENCOUNTER — Emergency Department: Payer: Medicare Other

## 2021-08-21 ENCOUNTER — Other Ambulatory Visit: Payer: Self-pay

## 2021-08-21 ENCOUNTER — Emergency Department
Admission: EM | Admit: 2021-08-21 | Discharge: 2021-08-21 | Disposition: A | Discharge status: Home / Self Care | Payer: Medicare Other | Attending: Emergency Medicine | Admitting: Emergency Medicine

## 2021-08-21 DIAGNOSIS — N179 Acute kidney failure, unspecified: Secondary | ICD-10-CM | POA: Insufficient documentation

## 2021-08-21 DIAGNOSIS — Z7982 Long term (current) use of aspirin: Secondary | ICD-10-CM | POA: Insufficient documentation

## 2021-08-21 DIAGNOSIS — R0789 Other chest pain: Secondary | ICD-10-CM | POA: Diagnosis not present

## 2021-08-21 DIAGNOSIS — Z8546 Personal history of malignant neoplasm of prostate: Secondary | ICD-10-CM | POA: Diagnosis not present

## 2021-08-21 DIAGNOSIS — R079 Chest pain, unspecified: Secondary | ICD-10-CM

## 2021-08-21 DIAGNOSIS — D649 Anemia, unspecified: Secondary | ICD-10-CM | POA: Insufficient documentation

## 2021-08-21 DIAGNOSIS — I10 Essential (primary) hypertension: Secondary | ICD-10-CM | POA: Diagnosis not present

## 2021-08-21 DIAGNOSIS — R0689 Other abnormalities of breathing: Secondary | ICD-10-CM | POA: Diagnosis not present

## 2021-08-21 DIAGNOSIS — I2510 Atherosclerotic heart disease of native coronary artery without angina pectoris: Secondary | ICD-10-CM | POA: Diagnosis not present

## 2021-08-21 LAB — BASIC METABOLIC PANEL
Anion gap: 10 (ref 5–15)
BUN: 21 mg/dL (ref 8–23)
CO2: 23 mmol/L (ref 22–32)
Calcium: 9 mg/dL (ref 8.9–10.3)
Chloride: 102 mmol/L (ref 98–111)
Creatinine, Ser: 1.41 mg/dL — ABNORMAL HIGH (ref 0.61–1.24)
GFR, Estimated: 48 mL/min — ABNORMAL LOW (ref >60)
Glucose, Bld: 116 mg/dL — ABNORMAL HIGH (ref 70–99)
Potassium: 4.8 mmol/L (ref 3.5–5.1)
Sodium: 135 mmol/L (ref 135–145)

## 2021-08-21 LAB — CBC
HCT: 26.1 % — ABNORMAL LOW (ref 39.0–52.0)
Hemoglobin: 8.3 g/dL — ABNORMAL LOW (ref 13.0–17.0)
MCH: 28.6 pg (ref 26.0–34.0)
MCHC: 31.8 g/dL (ref 30.0–36.0)
MCV: 90 fL (ref 80.0–100.0)
Platelets: 291 10*3/uL (ref 150–400)
RBC: 2.9 MIL/uL — ABNORMAL LOW (ref 4.22–5.81)
RDW: 15.3 % (ref 11.5–15.5)
WBC: 5.9 10*3/uL (ref 4.0–10.5)
nRBC: 0 % (ref 0.0–0.2)

## 2021-08-21 LAB — BRAIN NATRIURETIC PEPTIDE: B Natriuretic Peptide: 286.5 pg/mL — ABNORMAL HIGH (ref 0.0–100.0)

## 2021-08-21 LAB — TROPONIN I (HIGH SENSITIVITY)
Troponin I (High Sensitivity): 10 ng/L (ref <18)
Troponin I (High Sensitivity): 7 ng/L (ref <18)

## 2021-08-21 MED ORDER — TICAGRELOR 90 MG PO TABS: 90.0000 mg | ORAL_TABLET | Freq: Two times a day (BID) | ORAL | 3 refills | Status: DC

## 2021-08-21 MED ORDER — TICAGRELOR 90 MG PO TABS: 90.0000 mg | ORAL_TABLET | Freq: Once | ORAL | Status: DC

## 2021-08-21 MED ORDER — ASPIRIN 81 MG PO CHEW
243.0000 mg | CHEWABLE_TABLET | Freq: Once | ORAL | Status: AC
Start: 2021-08-21 — End: 2021-08-21
  Administered 2021-08-21: 243 mg via ORAL
  Filled 2021-08-21: qty 3

## 2021-08-21 NOTE — Telephone Encounter (Signed)
Spoke with Cesar Powell regarding the Brilinta refill. The patient stated, he has been off the [place] for 1 month due to having a urethra balloon stent however, the patient did not have the procedure nor did he start the Brilliant back. I see in the chart the Bril[facility] was [facility]. Patient is now having chest pain and feeling shaky. The patient was instructed per Jacelyn Grip, RN to report to the ER. I suggested the patient either dial 911 or have someone drive him to the ER. Cesar Powell stated, he will have his son drive him.  Please advise instructions on Brilinta.

## 2021-08-21 NOTE — Addendum Note (Signed)
Addended by: Mila Merry on: 08/21/2021 03:57 PM   Modules accepted: Orders

## 2021-08-21 NOTE — Telephone Encounter (Signed)
Called patient.   Explained that when the original refill request was made, it likely went to the hospitalist who discharged him from the hospital in October and that was why it was denied, as we didn't get the original refill request from the pharmacy.   I apologized that his refill request had taken several days to get taken care of, but told pt that I have sent in a years worth of refills, and he should be able to pick up his Brilinta from his pharmacy shortly.   Patient voiced appreciation for the call and understands that they can reach out to our office with any questions or concerns.

## 2021-08-21 NOTE — ED Provider Notes (Signed)
[facility] Provider Note    Event Date/Time   First MD Initiated Contact with Patient 08/21/21 1420     (approximate)   History   No chief complaint on file.   HPI  Cesar Powell is a 88 y.o. male  with a hx of hypertension, metastatic prostate cancer, obstructive sleep apnea, and chronic anemia and recent admission in October 2022 following a STEMI with placement of a stent to the RCA discharged on ASA and Brilinta who presents for assessment of some chest pain.  Patient states his Cesar Powell was stopped around a month ago anticipation of possible urological procedure although he states he has not had a procedure and is not aware of him being scheduled.  He states he still taking a baby aspirin.  He states that today around 10:00 he experienced substernal chest pressure rating to both arms that seem to subside over hours.  He did take nitroglycerin and he states that he noticed immediate provement when he took this as well.  He denies any fevers, chills, cough, back pain, headache, earache, sore throat, vomiting, diarrhea, new urinary symptoms, abdominal pain rash or extremity pain.  Denies any other significant sick symptoms at this time.  States he is currently chest pain-free.      Physical Exam  Triage Vital Signs: ED Triage Vitals  Enc Vitals Group     BP 08/21/21 1214 (!) 155/52     Pulse Rate 08/21/21 1214 74     Resp 08/21/21 1214 20     Temp 08/21/21 1214 97.9 F (36.6 C)     Temp Source 08/21/21 1214 Oral     SpO2 08/21/21 1214 98 %     Weight 08/21/21 1207 215 lb (97.5 kg)     Height 08/21/21 1207 5\' 7"  (1.702 m)     Head Circumference --      Peak Flow --      Pain Score 08/21/21 1206 4     Pain Loc --      Pain Edu? --      Excl. in [place]? --     Most recent vital signs: Vitals:   08/21/21 1214  BP: (!) 155/52  Pulse: 74  Resp: 20  Temp: 97.9 F (36.6 C)  SpO2: 98%    General: Awake, no distress.  CV:  Good peripheral  perfusion.  Slight systolic murmur.  2+ radial pulses. Resp:  Normal effort.  Clear bilaterally. Abd:  No distention.  Soft throughout.    ED Results / Procedures / Treatments  Labs (all labs ordered are listed, but only abnormal results are displayed) Labs Reviewed  BASIC METABOLIC PANEL - Abnormal; Notable for the following components:      Result Value   Glucose, Bld 116 (*)    Creatinine, Ser 1.41 (*)    GFR, Estimated 48 (*)    All other components within normal limits  CBC - Abnormal; Notable for the following components:   RBC 2.90 (*)    Hemoglobin 8.3 (*)    HCT 26.1 (*)    All other components within normal limits  BRAIN NATRIURETIC PEPTIDE - Abnormal; Notable for the following components:   B Natriuretic Peptide 286.5 (*)    All other components within normal limits  URINALYSIS, COMPLETE (UACMP) WITH MICROSCOPIC  TROPONIN I (HIGH SENSITIVITY)  TROPONIN I (HIGH SENSITIVITY)     EKG  EKG shows junctional rhythm with a ventricular rate of 67, normal axis, nonspecific ST changes  in lead III without any other clearance of acute ischemia or significant arrhythmia.  This EKG is compared to 1 obtained on 06/06/2021 that showed similar junctional rhythm and nonspecific change in lead III that appears very similar today.   RADIOLOGY  Chest x-ray reviewed by myself shows no focal consolidation, effusion, edema, pneumothorax and stable cardiomegaly.  I also reviewed radiology's interpretation and agree with the findings.    PROCEDURES:  Critical Care performed: No  Procedures    MEDICATIONS ORDERED IN ED: Medications  ticagrelor (BRILINTA) tablet 90 mg (has no administration in time range)  aspirin chewable tablet 243 mg (243 mg Oral Given 08/21/21 1503)     IMPRESSION / MDM / ASSESSMENT AND PLAN / ED COURSE  I reviewed the triage vital signs and the nursing notes.                              Differential diagnosis includes, but is not limited to, ACS,  GERD, metabolic derangements, anemia, pneumonia, thorax, costochondritis with lower suspicion for PE given resolution with nitroglycerin and no associated shortness of breath or evidence of hypoxia tachycardia tachypnea.  Patient's cardiac history as well as not being on [place] of potential high risk for restenosis and sudden death from sudden cardiac event.   EKG shows junctional rhythm with a ventricular rate of 67, normal axis, nonspecific ST changes in lead III without any other clearance of acute ischemia or significant arrhythmia.  This EKG is compared to 1 obtained on 06/06/2021 that showed similar junctional rhythm and nonspecific change in lead III that appears very similar today.  Chest x-ray reviewed by myself shows no focal consolidation, effusion, edema, pneumothorax and stable cardiomegaly.  I also reviewed radiology's interpretation and agree with the findings.   CBC shows no leukocytosis and stable anemia with hemoglobin of 8.3 compared to 8.710 days ago.  BMP shows evidence of an AKI with a creatinine of 1.41-1.0 310 days ago.  No other significant electrolyte or metabolic derangements.  Initial troponin is nonelevated 10.  Repeat is 7.  I discussed this with on-call cardiologist Dr. Candis Musa who also reviewed patient's EKG and agreed that this was less suggestive of an occlusion MI and possibly relating to an unstable angina but that given patient is chest pain-free with nonelevated troponins x2 it would be reasonable for him to follow-up with cardiology outpatient versus to be observed if patient was not comfortable with the plan.  I discussed this at length with the patient and offered observation explaining that he was at high risk for heart attack in the next couple of days until he can get back on his Brilinta and I encouraged observation but he adamantly wishes to go home to follow-up with his cardiologist.  BNP only slightly elevated to 286 compared to 481 4 months ago and overall  patient does not appear volume overloaded on exam or x-ray and I have low suspicion for cardiorenal syndrome.  With regard to his AKI post void residual shows less than 100 cc and I do not think this is related to an obstruction.  Patient thinks he may have had a little less to eat or drink the last couple days and I advised him to stay for IV hydration this rechecked although patient states he strongly prefers to orally rehydrate have this rechecked by PCP outpatient.  I think this is reasonable.  I have low suspicion for other immediate life-threatening process.  Discharged with plan to follow-up with PCP and cardiology.  Refill for Brilinta sent by cardiology.     FINAL CLINICAL IMPRESSION(S) / ED DIAGNOSES   Final diagnoses:  Chest pain, unspecified type  AKI (acute kidney injury) ([place])  Chronic anemia  Coronary artery disease involving native heart, unspecified vessel or lesion type, unspecified whether angina present     Rx / DC Orders   ED Discharge Orders     None        Note:  This document was prepared using Dragon voice recognition software and may include unintentional dictation errors.   Lucrezia Starch, MD 08/21/21 [phone]

## 2021-08-21 NOTE — Telephone Encounter (Signed)
Patient calling to check status of refill . Patient audibly irritated with delay and states he has been out for a while and this is not good .  Please call and discuss ,

## 2021-08-21 NOTE — ED Triage Notes (Signed)
Pt in via POV, reports new onset chest tightness with radiation down bilateral arms.  Reports similar feeling when he had MI October, 2022; hx of stent placement at that time.    NAD noted at this time.

## 2021-08-25 ENCOUNTER — Ambulatory Visit (INDEPENDENT_AMBULATORY_CARE_PROVIDER_SITE_OTHER): Payer: Medicare Other | Admitting: Family Medicine

## 2021-08-25 ENCOUNTER — Ambulatory Visit (INDEPENDENT_AMBULATORY_CARE_PROVIDER_SITE_OTHER): Payer: Medicare Other | Admitting: Cardiovascular Disease

## 2021-08-25 ENCOUNTER — Encounter: Payer: Self-pay | Admitting: Family Medicine

## 2021-08-25 ENCOUNTER — Other Ambulatory Visit: Payer: Self-pay

## 2021-08-25 ENCOUNTER — Encounter: Payer: Self-pay | Admitting: Cardiovascular Disease

## 2021-08-25 VITALS — BP 140/60 | HR 69 | Ht 67.5 in | Wt 218.4 lb

## 2021-08-25 VITALS — BP 140/60 | HR 79 | Temp 98.2°F | Ht 67.5 in | Wt 218.2 lb

## 2021-08-25 DIAGNOSIS — R079 Chest pain, unspecified: Secondary | ICD-10-CM

## 2021-08-25 DIAGNOSIS — I5032 Chronic diastolic (congestive) heart failure: Secondary | ICD-10-CM

## 2021-08-25 DIAGNOSIS — I2510 Atherosclerotic heart disease of native coronary artery without angina pectoris: Secondary | ICD-10-CM | POA: Insufficient documentation

## 2021-08-25 DIAGNOSIS — N170 Acute kidney failure with tubular necrosis: Secondary | ICD-10-CM | POA: Diagnosis not present

## 2021-08-25 DIAGNOSIS — R001 Bradycardia, unspecified: Secondary | ICD-10-CM | POA: Diagnosis not present

## 2021-08-25 DIAGNOSIS — R7989 Other specified abnormal findings of blood chemistry: Secondary | ICD-10-CM

## 2021-08-25 DIAGNOSIS — I25119 Atherosclerotic heart disease of native coronary artery with unspecified angina pectoris: Secondary | ICD-10-CM | POA: Diagnosis not present

## 2021-08-25 DIAGNOSIS — I25118 Atherosclerotic heart disease of native coronary artery with other forms of angina pectoris: Secondary | ICD-10-CM | POA: Diagnosis not present

## 2021-08-25 DIAGNOSIS — I2111 ST elevation (STEMI) myocardial infarction involving right coronary artery: Secondary | ICD-10-CM

## 2021-08-25 HISTORY — DX: Atherosclerotic heart disease of native coronary artery without angina pectoris: I25.10

## 2021-08-25 LAB — BASIC METABOLIC PANEL
BUN: 24 mg/dL — ABNORMAL HIGH (ref 6–23)
CO2: 24 mEq/L (ref 19–32)
Calcium: 9.3 mg/dL (ref 8.4–10.5)
Chloride: 105 mEq/L (ref 96–112)
Creatinine, Ser: 1.01 mg/dL (ref 0.40–1.50)
GFR: 66.18 mL/min (ref >60.00)
Glucose, Bld: 104 mg/dL — ABNORMAL HIGH (ref 70–99)
Potassium: 4.8 mEq/L (ref 3.5–5.1)
Sodium: 137 mEq/L (ref 135–145)

## 2021-08-25 MED ORDER — ATORVASTATIN CALCIUM 80 MG PO TABS: 80.0000 mg | ORAL_TABLET | Freq: Every day | ORAL | 3 refills | Status: AC

## 2021-08-25 MED ORDER — LOSARTAN POTASSIUM 25 MG PO TABS: 12.5000 mg | ORAL_TABLET | Freq: Every day | ORAL | 3 refills | Status: DC

## 2021-08-25 NOTE — Progress Notes (Signed)
Jaki Steptoe T. Edker Punt, MD, [place] at [facility] [place], [address]  Phone: [phone]   FAX: [phone]  MARKEISE MATHEWS - 88 y.o. male   MRN [id number]   Date of Birth: [birth year]  Date: 08/25/2021   PCP: Owens Loffler, MD   Referral: Owens Loffler, MD  Chief Complaint  Patient presents with   Follow-up    ED Visit-CP    This visit occurred during the SARS-CoV-2 public health emergency.  Safety protocols were in place, including screening questions prior to the visit, additional usage of staff PPE, and extensive cleaning of exam room while observing appropriate contact time as indicated for disinfecting solutions.   Subjective:   PAUBLO WARSHAWSKY is a 88 y.o. very pleasant male patient with Body mass index is 33.68 kg/m. who presents with the following:  08/21/2021 - Chest pain evaluation in the ER.  He does have a history of STEMI in October 22, with the placement of stent in the RCA, where he was placed on aspirin and Brilinta.  His Brilinta was stopped due to a scheduled urological procedure, and he is taking a baby aspirin only.  No procedure was ultimately done.  He did have substernal chest pain at about 10 AM.  He did take nitroglycerin, and his symptoms did improve at that point.  At the time that he was seen in the emergency room, he did not have any active chest pain.  On chart review, on his cath, the patient did have moderate to severe coronary disease on his left anterior descending as well as left circumflex arteries.  It was favored to manage this medically.  He did have 2 sets of troponins, and that were both normal.  This was in the emergency room.  Cardiac BNP was elevated at 287.  Elevated BMP in the ER Check on Brilinta  ER discussed with Dr. Rockey Situ - recommended ASAP follow-up with Cardiology with concern for unstable angia with resolution in the ER. - none since he left the  ER.  Talks about his Urological problems.  Getting retention all of the time.   Right now, the patient is not having any active chest pain.   Review of Systems is noted in the HPI, as appropriate  Patient Active Problem List   Diagnosis Date Noted   Coronary artery disease involving native coronary artery of native heart 08/25/2021    Priority: High   Prostate cancer ([place]) 08/08/2019    Priority: High   Local recurrence of prostate cancer ([place]) 06/22/2007    Priority: High   Acute renal failure with tubular necrosis ([place]) 05/23/2021   STEMI involving right coronary artery ([place]) 05/13/2021   Elevated brain natriuretic peptide (BNP) level    Goals of care, counseling/discussion 08/08/2019   Subtrochanteric fracture of femur ([place]) 10/08/2014   Essential hypertension 81/05/3158   METABOLIC SYNDROME X 45/85/9292   Obstructive sleep apnea 06/22/2007   DIVERTICULITIS, HX OF 06/22/2007    Past Medical History:  Diagnosis Date   Coronary artery disease involving native coronary artery of native heart 08/25/2021   H/O prostate cancer    was treated with radiation   Hypertension    Local recurrence of prostate cancer ([place]) 06/21/2004   Qualifier: Diagnosis of  By: Fuller Plan CMA (AAMA), [place]     Myocardial infarction ([place]) 2022   Obstructive sleep apnea 06/22/2007   NPSG [place] 1979 Unattended Home sleep Study- 05/10/14- Confirms severe OSA,  AHI 41.8/ hr, weight 230 pounds CPAP and also Transcend portable CPAP auto 8-15     Sleep apnea 1977   uses C-pap     Past Surgical History:  Procedure Laterality Date   ABDOMINAL SURGERY     ruptured intestines    CATARACT EXTRACTION W/PHACO Left 01/29/2016   Procedure: CATARACT EXTRACTION PHACO AND INTRAOCULAR LENS PLACEMENT ([place]) left eye;  Surgeon: Leandrew Koyanagi, MD;  Location: [place];  Service: Ophthalmology;  Laterality: Left;  RESTOR LENS   CATARACT EXTRACTION W/PHACO Right 10/21/2016   Procedure: CATARACT EXTRACTION  PHACO AND INTRAOCULAR LENS PLACEMENT (I[facility])  Right restor toric lens;  Surgeon: Leandrew Koyanagi, MD;  Location: [place];  Service: Ophthalmology;  Laterality: Right;  restor toric lens   CORONARY/GRAFT ACUTE MI REVASCULARIZATION N/A 05/13/2021   Procedure: Coronary/Graft Acute MI Revascularization;  Surgeon: Nelva Bush, MD;  Location: [place] [facility];  Service: Cardiovascular;  Laterality: N/A;   CYSTOSCOPY WITH LITHOLAPAXY N/A 08/14/2016   Procedure: CYSTOSCOPY WITH LITHOLAPAXY;  Surgeon: Nickie Retort, MD;  Location: [facility];  Service: Urology;  Laterality: N/A;   FEMUR IM NAIL Right 10/08/2014   Procedure: INTRAMEDULLARY (IM) NAIL FEMORAL;  Surgeon: Rod Can, MD;  Location: [place];  Service: Orthopedics;  Laterality: Right;  PER DANIELLE 110 MIN   HOLMIUM LASER APPLICATION  0/27/7412   Procedure: HOLMIUM LASER APPLICATION;  Surgeon: Nickie Retort, MD;  Location: [facility];  Service: Urology;;   LEFT HEART CATH AND CORONARY ANGIOGRAPHY N/A 05/13/2021   Procedure: LEFT HEART CATH AND CORONARY ANGIOGRAPHY;  Surgeon: Nelva Bush, MD;  Location: [place] [facility];  Service: Cardiovascular;  Laterality: N/A;   OTHER SURGICAL HISTORY  2006   Prostate Radiation Surgery    RADIOACTIVE SEED IMPLANT N/A 10/30/2019   Procedure: RADIOACTIVE SEED IMPLANT/BRACHYTHERAPY IMPLANT;  Surgeon: Billey Co, MD;  Location: [facility];  Service: Urology;  Laterality: N/A;  37 seeds implanted    Family History  Problem Relation Age of Onset   Heart disease Brother    Heart attack Brother    Lung cancer Brother    Prostate cancer Neg Hx    Bladder Cancer Neg Hx    Kidney cancer Neg Hx      Objective:   BP 140/60    Pulse 79    Temp 98.2 F (36.8 C) (Temporal)    Ht 5' 7.5" (1.715 m)    Wt 218 lb 4 oz (99 kg)    SpO2 95%    BMI 33.68 kg/m   GEN: No acute distress; alert,appropriate. PULM: Breathing comfortably in no respiratory distress PSYCH: Normally  interactive.  CV: RRR, no m/g/r  PULM: Normal respiratory rate, no accessory muscle use. No wheezes, crackles or rhonchi   Laboratory and Imaging Data: Results for orders placed or performed during the hospital encounter of 87/86/76  Basic metabolic panel  Result Value Ref Range   Sodium 135 135 - 145 mmol/L   Potassium 4.8 3.5 - 5.1 mmol/L   Chloride 102 98 - 111 mmol/L   CO2 23 22 - 32 mmol/L   Glucose, Bld 116 (H) 70 - 99 mg/dL   BUN 21 8 - 23 mg/dL   Creatinine, Ser 1.41 (H) 0.61 - 1.24 mg/dL   Calcium 9.0 8.9 - 10.3 mg/dL   GFR, Estimated 48 (L) >60 mL/min   Anion gap 10 5 - 15  CBC  Result Value Ref Range   WBC 5.9 4.0 - 10.5 K/uL  RBC 2.90 (L) 4.22 - 5.81 MIL/uL   Hemoglobin 8.3 (L) 13.0 - 17.0 g/dL   HCT 26.1 (L) 39.0 - 52.0 %   MCV 90.0 80.0 - 100.0 fL   MCH 28.6 26.0 - 34.0 pg   MCHC 31.8 30.0 - 36.0 g/dL   RDW 15.3 11.5 - 15.5 %   Platelets 291 150 - 400 K/uL   nRBC 0.0 0.0 - 0.2 %  Brain natriuretic peptide  Result Value Ref Range   B Natriuretic Peptide 286.5 (H) 0.0 - 100.0 pg/mL  Troponin I (High Sensitivity)  Result Value Ref Range   Troponin I (High Sensitivity) 10 <18 ng/L  Troponin I (High Sensitivity)  Result Value Ref Range   Troponin I (High Sensitivity) 7 <18 ng/L    DG Chest 2 View  Result Date: 08/21/2021 CLINICAL DATA:  Chest pain EXAM: CHEST - 2 VIEW COMPARISON:  05/19/2021 FINDINGS: Heart size at the upper limit of normal. Normal mediastinal contours with aortic atherosclerosis. No focal pulmonary opacity. No pleural effusion or pneumothorax. No acute osseous abnormality. IMPRESSION: Heart size at the upper limit of normal. Otherwise no acute cardiopulmonary abnormality. Electronically Signed   By: Merilyn Baba M.D.   On: 08/21/2021 12:42     Assessment and Plan:     ICD-10-CM   1. Coronary artery disease involving native coronary artery of native heart with angina pectoris [facility])  I25.119 Ambulatory referral to Cardiology    2. Acute  renal failure with tubular necrosis (HCC)  Y85.0 Basic metabolic panel    3. Chest pain at rest  R07.9 Ambulatory referral to Cardiology    4. Elevated brain natriuretic peptide (BNP) level  R79.89      Total encounter time: 45 minutes. This includes total time spent on the day of encounter.  This includes extensive chart review, review of prior cardiac procedures, as well as helping to coordinate an ASAP cardiology appointment.  The patient is going to see Dr. Rockey Situ thankfully at 230 this afternoon.  With chest pain that is active without any kind of additional activity that is relieved by nitroglycerin, concerning for possible angina.  He has not had additional chest pain.  Troponins are negative x2.  He did stop his Brilinta for an extended period of time.  He has now taking this again along with aspirin.  Prior RCA stent in October.  He does have existing moderate to severe LAD and left circumflex artery coronary artery disease.  Cardiology was called while the patient was in the ER, and thankfully they will be able to see him again today.  His cardiac BNP is also elevated, while lower than before.  In October, he did have grade 1 diastolic dysfunction on his echo.  Mildly elevated creatinine, creatinine is 1.4 and will recheck today.  Medications Discontinued During This Encounter  Medication Reason   sulfamethoxazole-trimethoprim (BACTRIM DS) 800-160 MG tablet Completed Course   Orders Placed This Encounter  Procedures   Basic metabolic panel   Ambulatory referral to Cardiology    Follow-up: No follow-ups on file.  Dragon [employer] One speech-to-text software was used for transcription in this dictation.  Possible transcriptional errors can occur using Editor, commissioning.   Signed,  Maud Deed. Chesney Suares, MD   Outpatient Encounter Medications as of 08/25/2021  Medication Sig   ascorbic acid (VITAMIN C) 500 MG tablet Take 1,000 mg by mouth daily.   aspirin 81 MG chewable tablet  Chew 1 tablet (81 mg total) by mouth daily.  atorvastatin (LIPITOR) 80 MG tablet Take 1 tablet (80 mg total) by mouth daily.   Cholecalciferol (VITAMIN D3) 50 MCG (2000 UT) TABS Take 2,000 Units by mouth daily.   losartan (COZAAR) 25 MG tablet Take 0.5 tablets (12.5 mg total) by mouth daily.   Multiple Vitamins-Minerals (ZINC PO) Take 1 tablet by mouth daily.   nitroGLYCERIN (NITROSTAT) 0.4 MG SL tablet Place 1 tablet (0.4 mg total) under the tongue every 5 (five) minutes as needed for chest pain.   Probiotic Product (PROBIOTIC PO) Take 1 capsule by mouth daily.   tamsulosin (FLOMAX) 0.4 MG CAPS capsule Take 1 capsule (0.4 mg total) by mouth daily after supper.   ticagrelor (BRILINTA) 90 MG TABS tablet Take 1 tablet (90 mg total) by mouth 2 (two) times daily.   [DISCONTINUED] sulfamethoxazole-trimethoprim (BACTRIM DS) 800-160 MG tablet Take 1 tablet by mouth every 12 (twelve) hours.   No facility-administered encounter medications on file as of 08/25/2021.

## 2021-08-25 NOTE — Patient Instructions (Signed)
kk
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
## 2021-08-25 NOTE — Patient Instructions (Addendum)
Medication Instructions:   For heartburn: Try a few tums and 1-2 pepcid (famotidine) as needed  If you need a refill on your cardiac medications before your next appointment, please call your pharmacy.   Lab work: No new labs needed  Testing/Procedures: No new testing needed  Follow-Up: At [facility], you and your health needs are our priority.  As part of our continuing mission to provide you with exceptional heart care, we have created designated Provider Care Teams.  These Care Teams include your primary Cardiologist (physician) and Advanced Practice Providers (APPs -  Physician Assistants and Nurse Practitioners) who all work together to provide you with the care you need, when you need it.  You will need a follow up appointment in 6 months (with Dr. Rockey Situ per patient request)  Providers on your designated Care Team:   Murray Hodgkins, NP Christell Faith, PA-C Cadence Kathlen Mody, [place]  COVID-19 Vaccine Information can be found at: [email or web address].uk For questions related to vaccine distribution or appointments, please email [email or web address] or call [phone].

## 2021-09-02 ENCOUNTER — Other Ambulatory Visit: Payer: Self-pay

## 2021-09-02 ENCOUNTER — Ambulatory Visit (INDEPENDENT_AMBULATORY_CARE_PROVIDER_SITE_OTHER): Payer: Medicare Other | Admitting: Urology

## 2021-09-02 DIAGNOSIS — C61 Malignant neoplasm of prostate: Secondary | ICD-10-CM | POA: Diagnosis not present

## 2021-09-02 DIAGNOSIS — R399 Unspecified symptoms and signs involving the genitourinary system: Secondary | ICD-10-CM | POA: Diagnosis not present

## 2021-09-02 NOTE — Progress Notes (Signed)
Virtual Visit via Telephone Note  I connected with Cesar Powell on 09/02/21 at  8:30 AM EST by telephone and verified that I am speaking with the correct person using two identifiers.   Patient location: Home Provider location: [facility]ic Office   I discussed the limitations, risks, security and privacy concerns of performing an evaluation and management service by telephone and the availability of in person appointments. We discussed the impact of the COVID-19 pandemic on the healthcare system, and the importance of social distancing and reducing patient and provider exposure. I also discussed with the patient that there may be a patient responsible charge related to this service. The patient expressed understanding and agreed to proceed.  Reason for visit: Complex 88 year old male with metastatic prostate cancer who has deferred ADT. Originally treated with radiation in 2005, and was on lupron for biochemical recurrence prior to discontinuing secondary to Powell effects. He has a history of cystolitholopaxy by Dr Pilar Jarvis in 2018. He also underwent brachytherapy and XRT to abdominal nodes in 2021 with Dr Baruch Gouty.   In September 2022 developed recurrent urinary retention, managed by multiple on call urology providers, unclear from notes if urethral stricture vs BNC vs prostate obstruction.   He underwent a cystoscopy with me on 06/05/2021 that showed slightly shaggy appearance of the prostatic urethra consistent with prior radiation, but no evidence of stricture and the cystoscope passed easily into the bladder without any resistance.  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  He was started on intermittent catheterization at that time and was originally doing well.   He had a cardiac event in October 2022 that required stent placement in the RCA, and he was started on dual anticoagulation with Brilinta and aspirin.  I had  previously recommended considering urodynamics for better evaluation of his bladder function but he was hesitant.  Currently, he is catheterizing 3-4 times per day with some occasional urge and urge incontinence in between these episodes, and not voiding spontaneously.  His leakage has improved over the last few weeks.  He is not having any problems passing a red rubber catheter.  He was treated for a UTI by Zara Council on 08/13/2021, and she also placed a catheter at that visit(unclear indication, PVR was normal), and he removed the catheter at home 1 week later.  He continues to have a rising PSA, most recently 12.06 on 08/11/2021, and he follows with Dr. Janese Banks who is planning to see him back in 2 months for repeat PSA and rediscuss ADT.  He is not on any treatment for his prostate cancer at this time, as he has deferred the ADT.  We again discussed the complexities of his situation regarding his prostate cancer, history of radiation, and bladder issues.  He may have a component of atonic bladder with some overactivity causing his urgency and some urge incontinence, but based on his cystoscopy from November 2022, and the fact that he continues to pass a catheter easily into his bladder, stricture is not the most likely etiology of his symptoms.  He does however, likely have some altered prostatic anatomy from his history of radiation, brachytherapy, and prostate cancer.  He would like to continue CIC 3-4 times a day, we could consider adding Myrbetriq in the future to see if this helps with some of his intermittent urge and urge incontinence.  Finally, he may benefit from a palliative care consult with his numerous issues from CAD, metastatic prostate  cancer, and urinary symptoms.  Follow Up:  -Continue CIC 3-4 times per day -Consider trial of Myrbetriq for urge/urge incontinence -Consider urodynamics in the future if patient amenable -Consider palliative care consult -RTC 3 months symptom check    I discussed the assessment and treatment plan with the patient. The patient was provided an opportunity to ask questions and all were answered. The patient agreed with the plan and demonstrated an understanding of the instructions.   The patient was advised to call back or seek an in-person evaluation if the symptoms worsen or if the condition fails to improve as anticipated.  I provided 13 minutes of non-face-to-face time during this encounter.   Billey Co, MD

## 2021-09-03 ENCOUNTER — Other Ambulatory Visit: Payer: Self-pay | Admitting: Medical

## 2021-09-09 DIAGNOSIS — M9905 Segmental and somatic dysfunction of pelvic region: Secondary | ICD-10-CM | POA: Diagnosis not present

## 2021-09-09 DIAGNOSIS — M5136 Other intervertebral disc degeneration, lumbar region: Secondary | ICD-10-CM | POA: Diagnosis not present

## 2021-09-09 DIAGNOSIS — M62830 Muscle spasm of back: Secondary | ICD-10-CM | POA: Diagnosis not present

## 2021-09-09 DIAGNOSIS — M9903 Segmental and somatic dysfunction of lumbar region: Secondary | ICD-10-CM | POA: Diagnosis not present

## 2021-09-12 ENCOUNTER — Ambulatory Visit: Payer: Medicare Other | Admitting: Medical

## 2021-10-07 DIAGNOSIS — M9905 Segmental and somatic dysfunction of pelvic region: Secondary | ICD-10-CM | POA: Diagnosis not present

## 2021-10-07 DIAGNOSIS — M9903 Segmental and somatic dysfunction of lumbar region: Secondary | ICD-10-CM | POA: Diagnosis not present

## 2021-10-07 DIAGNOSIS — M62830 Muscle spasm of back: Secondary | ICD-10-CM | POA: Diagnosis not present

## 2021-10-07 DIAGNOSIS — M5136 Other intervertebral disc degeneration, lumbar region: Secondary | ICD-10-CM | POA: Diagnosis not present

## 2021-10-28 DIAGNOSIS — M5136 Other intervertebral disc degeneration, lumbar region: Secondary | ICD-10-CM | POA: Diagnosis not present

## 2021-10-28 DIAGNOSIS — M9903 Segmental and somatic dysfunction of lumbar region: Secondary | ICD-10-CM | POA: Diagnosis not present

## 2021-10-28 DIAGNOSIS — M62830 Muscle spasm of back: Secondary | ICD-10-CM | POA: Diagnosis not present

## 2021-10-28 DIAGNOSIS — M9905 Segmental and somatic dysfunction of pelvic region: Secondary | ICD-10-CM | POA: Diagnosis not present

## 2021-11-05 ENCOUNTER — Other Ambulatory Visit: Payer: Self-pay | Admitting: Medical

## 2021-11-17 ENCOUNTER — Inpatient Hospital Stay: Payer: Medicare Other | Attending: Oncology

## 2021-11-17 ENCOUNTER — Encounter: Payer: Self-pay | Admitting: Oncology

## 2021-11-17 ENCOUNTER — Inpatient Hospital Stay (HOSPITAL_BASED_OUTPATIENT_CLINIC_OR_DEPARTMENT_OTHER): Payer: Medicare Other | Admitting: Oncology

## 2021-11-17 VITALS — BP 131/53 | HR 72 | Temp 98.0°F | Resp 18 | Wt 217.2 lb

## 2021-11-17 DIAGNOSIS — Z79899 Other long term (current) drug therapy: Secondary | ICD-10-CM | POA: Diagnosis not present

## 2021-11-17 DIAGNOSIS — Z87891 Personal history of nicotine dependence: Secondary | ICD-10-CM | POA: Diagnosis not present

## 2021-11-17 DIAGNOSIS — C61 Malignant neoplasm of prostate: Secondary | ICD-10-CM | POA: Diagnosis not present

## 2021-11-17 DIAGNOSIS — D649 Anemia, unspecified: Secondary | ICD-10-CM

## 2021-11-17 LAB — COMPREHENSIVE METABOLIC PANEL
ALT: 16 U/L (ref 0–44)
AST: 17 U/L (ref 15–41)
Albumin: 3.2 g/dL — ABNORMAL LOW (ref 3.5–5.0)
Alkaline Phosphatase: 56 U/L (ref 38–126)
Anion gap: 7 (ref 5–15)
BUN: 31 mg/dL — ABNORMAL HIGH (ref 8–23)
CO2: 25 mmol/L (ref 22–32)
Calcium: 8.7 mg/dL — ABNORMAL LOW (ref 8.9–10.3)
Chloride: 103 mmol/L (ref 98–111)
Creatinine, Ser: 1.11 mg/dL (ref 0.61–1.24)
GFR, Estimated: >60 mL/min (ref >60)
Glucose, Bld: 118 mg/dL — ABNORMAL HIGH (ref 70–99)
Potassium: 4.2 mmol/L (ref 3.5–5.1)
Sodium: 135 mmol/L (ref 135–145)
Total Bilirubin: 0.4 mg/dL (ref 0.3–1.2)
Total Protein: 7.1 g/dL (ref 6.5–8.1)

## 2021-11-17 LAB — IRON AND TIBC
Iron: 26 ug/dL — ABNORMAL LOW (ref 45–182)
Saturation Ratios: 12 % — ABNORMAL LOW (ref 17.9–39.5)
TIBC: 225 ug/dL — ABNORMAL LOW (ref 250–450)
UIBC: 199 ug/dL

## 2021-11-17 LAB — CBC WITH DIFFERENTIAL/PLATELET
Abs Immature Granulocytes: 0.04 10*3/uL (ref 0.00–0.07)
Basophils Absolute: 0 10*3/uL (ref 0.0–0.1)
Basophils Relative: 0 %
Eosinophils Absolute: 0.2 10*3/uL (ref 0.0–0.5)
Eosinophils Relative: 3 %
HCT: 26.1 % — ABNORMAL LOW (ref 39.0–52.0)
Hemoglobin: 7.9 g/dL — ABNORMAL LOW (ref 13.0–17.0)
Immature Granulocytes: 1 %
Lymphocytes Relative: 8 %
Lymphs Abs: 0.6 10*3/uL — ABNORMAL LOW (ref 0.7–4.0)
MCH: 27.2 pg (ref 26.0–34.0)
MCHC: 30.3 g/dL (ref 30.0–36.0)
MCV: 90 fL (ref 80.0–100.0)
Monocytes Absolute: 0.6 10*3/uL (ref 0.1–1.0)
Monocytes Relative: 7 %
Neutro Abs: 6.6 10*3/uL (ref 1.7–7.7)
Neutrophils Relative %: 81 %
Platelets: 325 10*3/uL (ref 150–400)
RBC: 2.9 MIL/uL — ABNORMAL LOW (ref 4.22–5.81)
RDW: 17.2 % — ABNORMAL HIGH (ref 11.5–15.5)
WBC: 8 10*3/uL (ref 4.0–10.5)
nRBC: 0 % (ref 0.0–0.2)

## 2021-11-17 LAB — PSA: Prostatic Specific Antigen: 25.1 ng/mL — ABNORMAL HIGH (ref 0.00–4.00)

## 2021-11-17 LAB — FERRITIN: Ferritin: 420 ng/mL — ABNORMAL HIGH (ref 24–336)

## 2021-11-17 NOTE — Progress Notes (Signed)
? ? ? ?Hematology/Oncology Consult note ?[place]  ?[phone] [phone] ? ?Patient Care Team: ?Owens Loffler, MD as PCP - General ?Leandrew Koyanagi, MD as Referring Physician (Ophthalmology) ?Nickie Retort, MD (Inactive) as Consulting Physician (Urology) ?Noreene Filbert, MD as Radiation Oncologist (Radiation Oncology)  ? ?Name of the patient: Cesar Powell  ?[id number]  ?1931/06/03  ? ?Date of visit: 11/17/21 ? ?Diagnosis-castrate resistant metastatic prostate cancer ? ?Chief complaint/ Reason for visit-routine follow-up visit of prostate cancer and anemia ? ?Heme/Onc history: patient is a 88 year old gentleman with no significant past medical problems.  He has a history of prostate cancer that was diagnosed and treated with radiation in 2005.  At that point he his PSA was about 0.03 for a long time.  Then started on Axiron for low testosterone.  PSA eventually went up to as high as 18 2015.  At that point he was started on Lupron his PSA doubling time is around 10 months. Most recently since October 2018 after his PSA went down from 3.6-1.7 and has been gradually increasing to 1.3,1.5 and 2 indicating a PSA doubling time of 9.9 months.  Testosterone levels continue to be suppressed.  He has not started oral anti androgen therapy yet.  ?  ?patient was last seen by me in September 2019 discussed adding oral antiandrogens like apalutamide versus enzalutamide to Lupron in castrate resistant nonmetastatic prostate cancer.  After discussing risks and benefits patient did not wish to proceed.Patient stopped lupron in feb 2020 but did restart after 1 year in February 2021.  Patient received IMRT for his pelvic lymph nodes. ? ?Patient has had significant anemia with a hemoglobin that has fluctuated between 8-9 since August 2022.  Prior to that his hemoglobin was stable between 11-12. ?  ? ?Interval history-patient reports ongoing fatigue.  Denies any new aches and  pains anywhere.  He is trying dietary modifications to bring down his PSA. ? ?ECOG PS- 1 ?Pain scale- 0 ? ? ?Review of systems- Review of Systems  ?Constitutional:  Positive for malaise/fatigue. Negative for chills, fever and weight loss.  ?HENT:  Negative for congestion, ear discharge and nosebleeds.   ?Eyes:  Negative for blurred vision.  ?Respiratory:  Negative for cough, hemoptysis, sputum production, shortness of breath and wheezing.   ?Cardiovascular:  Negative for chest pain, palpitations, orthopnea and claudication.  ?Gastrointestinal:  Negative for abdominal pain, blood in stool, constipation, diarrhea, heartburn, melena, nausea and vomiting.  ?Genitourinary:  Negative for dysuria, flank pain, frequency, hematuria and urgency.  ?Musculoskeletal:  Negative for back pain, joint pain and myalgias.  ?Skin:  Negative for rash.  ?Neurological:  Negative for dizziness, tingling, focal weakness, seizures, weakness and headaches.  ?Endo/Heme/Allergies:  Does not bruise/bleed easily.  ?Psychiatric/Behavioral:  Negative for depression and suicidal ideas. The patient does not have insomnia.    ? ? ?Allergies  ?Allergen Reactions  ? Gluten Meal Rash  ? ? ? ?Past Medical History:  ?Diagnosis Date  ? Coronary artery disease involving native coronary artery of native heart 08/25/2021  ? H/O prostate cancer   ? was treated with radiation  ? Hypertension   ? Local recurrence of prostate cancer ([place]) 06/21/2004  ? Qualifier: Diagnosis of  By: Fuller Plan CMA (AAMA), Lugene    ? Myocardial infarction [facility]) 2022  ? Obstructive sleep apnea 06/22/2007  ? NPSG [place] 1979 Unattended Home sleep Study- 05/10/14- Confirms severe OSA, AHI 41.8/ hr, weight 230 pounds CPAP and also Transcend portable CPAP auto 8-15    ?  Sleep apnea 1977  ? uses C-pap   ? ? ? ?Past Surgical History:  ?Procedure Laterality Date  ? ABDOMINAL SURGERY    ? ruptured intestines   ? CATARACT EXTRACTION W/PHACO Left 01/29/2016  ? Procedure: CATARACT EXTRACTION PHACO  AND INTRAOCULAR LENS PLACEMENT ([place]) left eye;  Surgeon: Leandrew Koyanagi, MD;  Location: [place];  Service: Ophthalmology;  Laterality: Left;  RESTOR LENS  ? CATARACT EXTRACTION W/PHACO Right 10/21/2016  ? Procedure: CATARACT EXTRACTION PHACO AND INTRAOCULAR LENS PLACEMENT ([facility])  Right restor toric lens;  Surgeon: Leandrew Koyanagi, MD;  Location: [place];  Service: Ophthalmology;  Laterality: Right;  restor toric lens  ? CORONARY/GRAFT ACUTE MI REVASCULARIZATION N/A 05/13/2021  ? Procedure: Coronary/Graft Acute MI Revascularization;  Surgeon: Nelva Bush, MD;  Location: [place] CV LAB;  Service: Cardiovascular;  Laterality: N/A;  ? CYSTOSCOPY WITH LITHOLAPAXY N/A 08/14/2016  ? Procedure: CYSTOSCOPY WITH LITHOLAPAXY;  Surgeon: Nickie Retort, MD;  Location: [facility];  Service: Urology;  Laterality: N/A;  ? FEMUR IM NAIL Right 10/08/2014  ? Procedure: INTRAMEDULLARY (IM) NAIL FEMORAL;  Surgeon: Rod Can, MD;  Location: [place];  Service: Orthopedics;  Laterality: Right;  PER DANIELLE 110 MIN  ? HOLMIUM LASER APPLICATION  6/59/9357  ? Procedure: HOLMIUM LASER APPLICATION;  Surgeon: Nickie Retort, MD;  Location: [facility];  Service: Urology;;  ? LEFT HEART CATH AND CORONARY ANGIOGRAPHY N/A 05/13/2021  ? Procedure: LEFT HEART CATH AND CORONARY ANGIOGRAPHY;  Surgeon: Nelva Bush, MD;  Location: [place] CV LAB;  Service: Cardiovascular;  Laterality: N/A;  ? OTHER SURGICAL HISTORY  2006  ? Prostate Radiation Surgery   ? RADIOACTIVE SEED IMPLANT N/A 10/30/2019  ? Procedure: RADIOACTIVE SEED IMPLANT/BRACHYTHERAPY IMPLANT;  Surgeon: Billey Co, MD;  Location: [facility];  Service: Urology;  Laterality: N/A;  41 seeds implanted  ? ? ?Social History  ? ?Socioeconomic History  ? Marital status: Married  ?  Spouse name: Not on file  ? Number of children: Not on file  ? Years of education: Not on file  ? Highest education level: Not on file  ?Occupational History  ?  Occupation: retired  ?Tobacco Use  ? Smoking status: Former  ?  Packs/day: 1.00  ?  Years: 15.00  ?  Pack years: 15.00  ?  Types: Cigarettes  ?  Quit date: 09/14/1965  ?  Years since quitting: 56.2  ? Smokeless tobacco: Never  ?Vaping Use  ? Vaping Use: Never used  ?Substance and Sexual Activity  ? Alcohol use: Yes  ?  Alcohol/week: 7.0 standard drinks  ?  Types: 7 Glasses of wine per week  ? Drug use: No  ? Sexual activity: Yes  ?  Birth control/protection: None  ?Other Topics Concern  ? Not on file  ?Social History Narrative  ? Not on file  ? ?Social Determinants of Health  ? ?Financial Resource Strain: Not on file  ?Food Insecurity: Not on file  ?Transportation Needs: Not on file  ?Physical Activity: Not on file  ?Stress: Not on file  ?Social Connections: Not on file  ?Intimate Partner Violence: Not on file  ? ? ?Family History  ?Problem Relation Age of Onset  ? Heart disease Brother   ? Heart attack Brother   ? Lung cancer Brother   ? Prostate cancer Neg Hx   ? Bladder Cancer Neg Hx   ? Kidney cancer Neg Hx   ? ? ? ?Current Outpatient Medications:  ?  ascorbic acid (  VITAMIN C) 500 MG tablet, Take 1,000 mg by mouth daily., Disp: , Rfl:  ?  aspirin 81 MG chewable tablet, Chew 1 tablet (81 mg total) by mouth daily., Disp: , Rfl:  ?  atorvastatin (LIPITOR) 80 MG tablet, Take 1 tablet (80 mg total) by mouth daily., Disp: 90 tablet, Rfl: 3 ?  Cholecalciferol (VITAMIN D3) 50 MCG (2000 UT) TABS, Take 2,000 Units by mouth daily., Disp: , Rfl:  ?  isosorbide mononitrate (IMDUR) 30 MG 24 hr tablet, Take 15 mg by mouth daily., Disp: , Rfl:  ?  losartan (COZAAR) 25 MG tablet, Take 0.5 tablets (12.5 mg total) by mouth daily., Disp: 45 tablet, Rfl: 3 ?  Multiple Vitamins-Minerals (ZINC PO), Take 1 tablet by mouth daily., Disp: , Rfl:  ?  tamsulosin (FLOMAX) 0.4 MG CAPS capsule, Take 1 capsule (0.4 mg total) by mouth daily after supper., Disp: 180 capsule, Rfl: 3 ?  ticagrelor (BRILINTA) 90 MG TABS tablet, Take 1 tablet (90 mg  total) by mouth 2 (two) times daily., Disp: 180 tablet, Rfl: 3 ?  nitroGLYCERIN (NITROSTAT) 0.4 MG SL tablet, PLACE 1 TABLET UNDER THE TONGUE EVERY 5 MINUTES AS NEEDED FOR CHEST PAIN. (Patient not taki

## 2021-11-17 NOTE — Progress Notes (Signed)
Pt states he believes his lack of sleep is causing a increase in his BP. ?

## 2021-11-20 ENCOUNTER — Other Ambulatory Visit: Payer: Self-pay

## 2021-11-20 ENCOUNTER — Telehealth: Payer: Self-pay

## 2021-11-20 NOTE — Progress Notes (Signed)
Contacted pt to provide BM Biopsy instructions for 11/27/21 8:30am procedure with a 7:30am arrival. Nothing to eat or drink 8 hours prior to procedure at the medical mall in the hospital. Will need a driver due to medications and lasts between 2-4 hours. Pt did not answer phone so left a VM explaining instructions. If any questions, I provided a call back phone number.  ?  ?

## 2021-11-20 NOTE — Telephone Encounter (Signed)
I tried multiple times to patient home number and cell phone number but didn't get an answer. I called patient daughter and she stated that he did get our voicemail from a prior call about his BM biopsy appointment. I stated the instruction to the daughter about him not being able to drink or eat for 8 hours prior to procedure, he will need a driver due to medications that will last 2-4 hours. Patient daughter stated she understood and did not understand why her father had not answered her calls but she was aware of some of the information she just didn't not know he couldn't eat or drink and was wondering if he could have water. After speaking with a nurse she stated to me He could have some sis of water but that's it. Patient daughter verbalized that she understood   ?

## 2021-11-24 NOTE — Progress Notes (Signed)
Spoke with patient's daughter 11/24/21 @ 11:45 am. Cesar Powell over pre-procedure instructions to include the need to arrive at 7:30 for 8:30 appointment, need to be NPO after midnight on the night prior to procedure, need to hold Aspirin morning of procedure and need for a responsible party to release patient to post procedure.  ? ?Patient's daughter stated that she thinks patient is on other blood thinners not listed in his chart, advised to hold any blood thinners morning of procedure. Daughter also stated that patient had planned to drive himself and then wait around at hospital until 1:30 appointment with Urologist at the hospital. Advised her that we must have a responsible party to release the patient to post procedure and that we could not release the patient to wait around until his other appointment. Patient's daughter verbalized understanding of all of the above. ?

## 2021-11-25 ENCOUNTER — Encounter (HOSPITAL_COMMUNITY)
Admission: RE | Admit: 2021-11-25 | Discharge: 2021-11-25 | Disposition: A | Discharge status: Home / Self Care | Payer: Medicare Other | Source: Ambulatory Visit | Attending: Oncology | Admitting: Oncology

## 2021-11-25 DIAGNOSIS — K449 Diaphragmatic hernia without obstruction or gangrene: Secondary | ICD-10-CM | POA: Diagnosis not present

## 2021-11-25 DIAGNOSIS — M62830 Muscle spasm of back: Secondary | ICD-10-CM | POA: Diagnosis not present

## 2021-11-25 DIAGNOSIS — I6522 Occlusion and stenosis of left carotid artery: Secondary | ICD-10-CM | POA: Diagnosis not present

## 2021-11-25 DIAGNOSIS — C61 Malignant neoplasm of prostate: Secondary | ICD-10-CM | POA: Diagnosis not present

## 2021-11-25 DIAGNOSIS — I2510 Atherosclerotic heart disease of native coronary artery without angina pectoris: Secondary | ICD-10-CM | POA: Diagnosis not present

## 2021-11-25 DIAGNOSIS — M9905 Segmental and somatic dysfunction of pelvic region: Secondary | ICD-10-CM | POA: Diagnosis not present

## 2021-11-25 DIAGNOSIS — M9903 Segmental and somatic dysfunction of lumbar region: Secondary | ICD-10-CM | POA: Diagnosis not present

## 2021-11-25 DIAGNOSIS — D649 Anemia, unspecified: Secondary | ICD-10-CM | POA: Diagnosis not present

## 2021-11-25 DIAGNOSIS — M5136 Other intervertebral disc degeneration, lumbar region: Secondary | ICD-10-CM | POA: Diagnosis not present

## 2021-11-25 MED ORDER — PIFLIFOLASTAT F 18 (PYLARIFY) INJECTION
9.0000 | Freq: Once | INTRAVENOUS | Status: AC
  Administered 2021-11-25: 8.3 via INTRAVENOUS

## 2021-11-25 NOTE — Progress Notes (Signed)
Spoke with patient 11/25/21 @ 7:45 am. Patient returned missed call from yesterday. Went over pre-procedure instructions with patient as in previous note.  ? ?Patient inquired about duration of sedation medications and said he thought they would wear off soon enough for him to drive himself home after his afternoon appointment within the hospital. Advised patient that most of the effects wear off shortly after procedure, but that some effects from the medications could linger and that was why we recommend that he not drive himself home. Patient verbalized understanding.  ?

## 2021-11-26 ENCOUNTER — Other Ambulatory Visit: Payer: Self-pay | Admitting: Radiology

## 2021-11-26 NOTE — H&P (Signed)
? ?Chief Complaint: Patient was seen in consultation today for bone marrow aspiration/biopsy. ? ?Referring Physician(s): Rao,Archana C ? ?Supervising Physician: Arne Cleveland ? ?Patient Status: [place] ? ?History of Present Illness: ?Cesar Powell is a 88 y.o. male with a past medical history significant for OSA, CAD, HTN and castrate resistant metastatic prostate cancer who presents today for a bone marrow aspiration/biopsy. Mr. Reichardt was first diagnosed with prostate cancer in 2005 which was treated with radiation, he has been on Lupron since 2018. He is followed by heme/onc for prostate cancer and was noted to have significant anemia in October of 2022, lab testing has not revealed a cause for the anemia. IR has been consulted for a bone marrow aspiration/biopsy to further direct care. ? ? ?Past Medical History:  ?Diagnosis Date  ? Coronary artery disease involving native coronary artery of native heart 08/25/2021  ? H/O prostate cancer   ? was treated with radiation  ? Hypertension   ? Local recurrence of prostate cancer ([place]) 06/21/2004  ? Qualifier: Diagnosis of  By: Fuller Plan CMA (AAMA), Lugene    ? Myocardial infarction [facility]) 2022  ? Obstructive sleep apnea 06/22/2007  ? NPSG [place] 1979 Unattended Home sleep Study- 05/10/14- Confirms severe OSA, AHI 41.8/ hr, weight 230 pounds CPAP and also Transcend portable CPAP auto 8-15    ? Sleep apnea 1977  ? uses C-pap   ? ? ?Past Surgical History:  ?Procedure Laterality Date  ? ABDOMINAL SURGERY    ? ruptured intestines   ? CATARACT EXTRACTION W/PHACO Left 01/29/2016  ? Procedure: CATARACT EXTRACTION PHACO AND INTRAOCULAR LENS PLACEMENT ([place]) left eye;  Surgeon: Leandrew Koyanagi, MD;  Location: [place];  Service: Ophthalmology;  Laterality: Left;  RESTOR LENS  ? CATARACT EXTRACTION W/PHACO Right 10/21/2016  ? Procedure: CATARACT EXTRACTION PHACO AND INTRAOCULAR LENS PLACEMENT ([facility])  Right restor toric lens;  Surgeon: Leandrew Koyanagi,  MD;  Location: [place];  Service: Ophthalmology;  Laterality: Right;  restor toric lens  ? CORONARY/GRAFT ACUTE MI REVASCULARIZATION N/A 05/13/2021  ? Procedure: Coronary/Graft Acute MI Revascularization;  Surgeon: Nelva Bush, MD;  Location: [place] CV LAB;  Service: Cardiovascular;  Laterality: N/A;  ? CYSTOSCOPY WITH LITHOLAPAXY N/A 08/14/2016  ? Procedure: CYSTOSCOPY WITH LITHOLAPAXY;  Surgeon: Nickie Retort, MD;  Location: [facility];  Service: Urology;  Laterality: N/A;  ? FEMUR IM NAIL Right 10/08/2014  ? Procedure: INTRAMEDULLARY (IM) NAIL FEMORAL;  Surgeon: Rod Can, MD;  Location: [place];  Service: Orthopedics;  Laterality: Right;  PER DANIELLE 110 MIN  ? HOLMIUM LASER APPLICATION  11/24/5359  ? Procedure: HOLMIUM LASER APPLICATION;  Surgeon: Nickie Retort, MD;  Location: [facility];  Service: Urology;;  ? LEFT HEART CATH AND CORONARY ANGIOGRAPHY N/A 05/13/2021  ? Procedure: LEFT HEART CATH AND CORONARY ANGIOGRAPHY;  Surgeon: Nelva Bush, MD;  Location: [place] CV LAB;  Service: Cardiovascular;  Laterality: N/A;  ? OTHER SURGICAL HISTORY  2006  ? Prostate Radiation Surgery   ? RADIOACTIVE SEED IMPLANT N/A 10/30/2019  ? Procedure: RADIOACTIVE SEED IMPLANT/BRACHYTHERAPY IMPLANT;  Surgeon: Billey Co, MD;  Location: [facility];  Service: Urology;  Laterality: N/A;  41 seeds implanted  ? ? ?Allergies: ?Gluten meal ? ?Medications: ?Prior to Admission medications   ?Medication Sig Start Date End Date Taking? Authorizing Provider  ?ascorbic acid (VITAMIN C) 500 MG tablet Take 1,000 mg by mouth daily.    [provider]  ?aspirin 81 MG chewable tablet Chew 1  tablet (81 mg total) by mouth daily. 05/16/21 05/16/22  Jennye Boroughs, MD  ?atorvastatin (LIPITOR) 80 MG tablet Take 1 tablet (80 mg total) by mouth daily. 08/25/21   Minna Merritts, MD  ?Cholecalciferol (VITAMIN D3) 50 MCG (2000 UT) TABS Take 2,000 Units by mouth daily.    [provider]   ?isosorbide mononitrate (IMDUR) 30 MG 24 hr tablet Take 15 mg by mouth daily. 11/14/21   [provider]  ?losartan (COZAAR) 25 MG tablet Take 0.5 tablets (12.5 mg total) by mouth daily. 08/25/21   Minna Merritts, MD  ?Multiple Vitamins-Minerals (ZINC PO) Take 1 tablet by mouth daily.    [provider]  ?nitroGLYCERIN (NITROSTAT) 0.4 MG SL tablet PLACE 1 TABLET UNDER THE TONGUE EVERY 5 MINUTES AS NEEDED FOR CHEST PAIN. ?Patient not taking: Reported on 11/17/2021 11/05/21   Kathlen Mody, Cadence H, PA-C  ?Probiotic Product (PROBIOTIC PO) Take 1 capsule by mouth daily. ?Patient not taking: Reported on 11/17/2021    [provider]  ?tamsulosin (FLOMAX) 0.4 MG CAPS capsule Take 1 capsule (0.4 mg total) by mouth daily after supper. 04/16/21   Bonnielee Haff, MD  ?ticagrelor (BRILINTA) 90 MG TABS tablet Take 1 tablet (90 mg total) by mouth 2 (two) times daily. 08/21/21   Furth, Cadence H, PA-C  ?  ? ?Family History  ?Problem Relation Age of Onset  ? Heart disease Brother   ? Heart attack Brother   ? Lung cancer Brother   ? Prostate cancer Neg Hx   ? Bladder Cancer Neg Hx   ? Kidney cancer Neg Hx   ? ? ?Social History  ? ?Socioeconomic History  ? Marital status: Married  ?  Spouse name: Not on file  ? Number of children: Not on file  ? Years of education: Not on file  ? Highest education level: Not on file  ?Occupational History  ? Occupation: retired  ?Tobacco Use  ? Smoking status: Former  ?  Packs/day: 1.00  ?  Years: 15.00  ?  Pack years: 15.00  ?  Types: Cigarettes  ?  Quit date: 09/14/1965  ?  Years since quitting: 56.2  ? Smokeless tobacco: Never  ?Vaping Use  ? Vaping Use: Never used  ?Substance and Sexual Activity  ? Alcohol use: Yes  ?  Alcohol/week: 7.0 standard drinks  ?  Types: 7 Glasses of wine per week  ? Drug use: No  ? Sexual activity: Yes  ?  Birth control/protection: None  ?Other Topics Concern  ? Not on file  ?Social History Narrative  ? Not on file  ? ?Social Determinants of Health   ? ?Financial Resource Strain: Not on file  ?Food Insecurity: Not on file  ?Transportation Needs: Not on file  ?Physical Activity: Not on file  ?Stress: Not on file  ?Social Connections: Not on file  ? ? ? ?Review of Systems: A 12 point ROS discussed and pertinent positives are indicated in the HPI above.  All other systems are negative. ? ?Review of Systems  ?Constitutional:  Negative for chills and fever.  ?Respiratory:  Negative for cough and shortness of breath.   ?Cardiovascular:  Negative for chest pain.  ?Gastrointestinal:  Negative for abdominal pain, diarrhea, nausea and vomiting.  ?Genitourinary:  Negative for dysuria.  ?Musculoskeletal:  Negative for back pain.  ?Neurological:  Negative for dizziness and headaches.  ? ?Vital Signs: ?BP 131/64   Pulse 85   Temp 98.5 ?F (36.9 ?C) (Oral)   Resp 20  Ht '5\' 7"'$  (1.702 m)   Wt 215 lb (97.5 kg)   SpO2 98%   BMI 33.67 kg/m?  ? ?Physical Exam ?Vitals reviewed.  ?Constitutional:   ?   General: He is not in acute distress. ?HENT:  ?   Head: Normocephalic.  ?   Mouth/Throat:  ?   Mouth: Mucous membranes are moist.  ?   Pharynx: Oropharynx is clear. No oropharyngeal exudate or posterior oropharyngeal erythema.  ?Cardiovascular:  ?   Rate and Rhythm: Normal rate and regular rhythm.  ?Pulmonary:  ?   Effort: Pulmonary effort is normal.  ?   Breath sounds: Normal breath sounds.  ?Abdominal:  ?   General: There is no distension.  ?   Palpations: Abdomen is soft.  ?   Tenderness: There is no abdominal tenderness.  ?Skin: ?   General: Skin is warm and dry.  ?Neurological:  ?   Mental Status: He is alert and oriented to person, place, and time.  ?Psychiatric:     ?   Mood and Affect: Mood normal.     ?   Behavior: Behavior normal.     ?   Thought Content: Thought content normal.     ?   Judgment: Judgment normal.  ? ? ? ?MD Evaluation ?Airway: WNL ?Heart: WNL ?Abdomen: WNL ?Chest/ Lungs: WNL ?ASA  Classification: 2 ?Mallampati/Airway Score: One ? ? ?Imaging: ?NM PET  (PSMA) SKULL TO MID THIGH ? ?Result Date: 11/26/2021 ?CLINICAL DATA:  Evaluate for recurrent prostate cancer. Diagnosed 18 years ago. Radiation therapy to pelvis 1 year ago. PSA of 25.1 on 11/17/2021. EXAM: NUCLEAR MED

## 2021-11-27 ENCOUNTER — Ambulatory Visit (INDEPENDENT_AMBULATORY_CARE_PROVIDER_SITE_OTHER): Payer: Medicare Other | Admitting: Urology

## 2021-11-27 ENCOUNTER — Ambulatory Visit
Admission: RE | Admit: 2021-11-27 | Discharge: 2021-11-27 | Disposition: A | Discharge status: Home / Self Care | Payer: Medicare Other | Source: Ambulatory Visit | Attending: Oncology | Admitting: Oncology

## 2021-11-27 ENCOUNTER — Ambulatory Visit: Payer: Medicare Other | Admitting: Urology

## 2021-11-27 ENCOUNTER — Encounter: Payer: Self-pay | Admitting: Urology

## 2021-11-27 VITALS — BP 147/79 | HR 85 | Ht 67.5 in | Wt 215.0 lb

## 2021-11-27 DIAGNOSIS — G4733 Obstructive sleep apnea (adult) (pediatric): Secondary | ICD-10-CM | POA: Insufficient documentation

## 2021-11-27 DIAGNOSIS — D7589 Other specified diseases of blood and blood-forming organs: Secondary | ICD-10-CM | POA: Diagnosis not present

## 2021-11-27 DIAGNOSIS — C61 Malignant neoplasm of prostate: Secondary | ICD-10-CM

## 2021-11-27 DIAGNOSIS — I10 Essential (primary) hypertension: Secondary | ICD-10-CM | POA: Diagnosis not present

## 2021-11-27 DIAGNOSIS — Z923 Personal history of irradiation: Secondary | ICD-10-CM | POA: Diagnosis not present

## 2021-11-27 DIAGNOSIS — Z192 Hormone resistant malignancy status: Secondary | ICD-10-CM | POA: Insufficient documentation

## 2021-11-27 DIAGNOSIS — I2510 Atherosclerotic heart disease of native coronary artery without angina pectoris: Secondary | ICD-10-CM | POA: Insufficient documentation

## 2021-11-27 DIAGNOSIS — Z8249 Family history of ischemic heart disease and other diseases of the circulatory system: Secondary | ICD-10-CM | POA: Diagnosis not present

## 2021-11-27 DIAGNOSIS — R32 Unspecified urinary incontinence: Secondary | ICD-10-CM | POA: Diagnosis not present

## 2021-11-27 DIAGNOSIS — D649 Anemia, unspecified: Secondary | ICD-10-CM | POA: Insufficient documentation

## 2021-11-27 DIAGNOSIS — Z9989 Dependence on other enabling machines and devices: Secondary | ICD-10-CM | POA: Insufficient documentation

## 2021-11-27 DIAGNOSIS — Z8546 Personal history of malignant neoplasm of prostate: Secondary | ICD-10-CM | POA: Diagnosis not present

## 2021-11-27 DIAGNOSIS — Z79818 Long term (current) use of other agents affecting estrogen receptors and estrogen levels: Secondary | ICD-10-CM | POA: Diagnosis not present

## 2021-11-27 LAB — CBC WITH DIFFERENTIAL/PLATELET
Abs Immature Granulocytes: 0.05 10*3/uL (ref 0.00–0.07)
Basophils Absolute: 0.1 10*3/uL (ref 0.0–0.1)
Basophils Relative: 1 %
Eosinophils Absolute: 0.3 10*3/uL (ref 0.0–0.5)
Eosinophils Relative: 3 %
HCT: 25.4 % — ABNORMAL LOW (ref 39.0–52.0)
Hemoglobin: 7.8 g/dL — ABNORMAL LOW (ref 13.0–17.0)
Immature Granulocytes: 1 %
Lymphocytes Relative: 5 %
Lymphs Abs: 0.5 10*3/uL — ABNORMAL LOW (ref 0.7–4.0)
MCH: 27.5 pg (ref 26.0–34.0)
MCHC: 30.7 g/dL (ref 30.0–36.0)
MCV: 89.4 fL (ref 80.0–100.0)
Monocytes Absolute: 0.7 10*3/uL (ref 0.1–1.0)
Monocytes Relative: 8 %
Neutro Abs: 7.1 10*3/uL (ref 1.7–7.7)
Neutrophils Relative %: 82 %
Platelets: 323 10*3/uL (ref 150–400)
RBC: 2.84 MIL/uL — ABNORMAL LOW (ref 4.22–5.81)
RDW: 17 % — ABNORMAL HIGH (ref 11.5–15.5)
WBC: 8.7 10*3/uL (ref 4.0–10.5)
nRBC: 0 % (ref 0.0–0.2)

## 2021-11-27 MED ORDER — MIDAZOLAM HCL 2 MG/2ML IJ SOLN
INTRAMUSCULAR | Status: AC | PRN
  Administered 2021-11-27: 1 mg via INTRAVENOUS

## 2021-11-27 MED ORDER — FENTANYL CITRATE (PF) 100 MCG/2ML IJ SOLN
INTRAMUSCULAR | Status: AC
  Filled 2021-11-27: qty 2

## 2021-11-27 MED ORDER — FENTANYL CITRATE (PF) 100 MCG/2ML IJ SOLN
INTRAMUSCULAR | Status: AC | PRN
  Administered 2021-11-27: 50 ug via INTRAVENOUS

## 2021-11-27 MED ORDER — HEPARIN SOD (PORK) LOCK FLUSH 100 UNIT/ML IV SOLN
INTRAVENOUS | Status: AC
  Filled 2021-11-27: qty 5

## 2021-11-27 MED ORDER — SODIUM CHLORIDE 0.9 % IV SOLN: INTRAVENOUS | Status: DC

## 2021-11-27 MED ORDER — HYDROCODONE-ACETAMINOPHEN 5-325 MG PO TABS: 1.0000 | ORAL_TABLET | ORAL | Status: DC | PRN

## 2021-11-27 MED ORDER — MIDAZOLAM HCL 2 MG/2ML IJ SOLN
INTRAMUSCULAR | Status: AC
  Filled 2021-11-27: qty 2

## 2021-11-27 NOTE — Procedures (Signed)
?  Procedure:  CT core bone marrow biopsy R iliac ?Preprocedure diagnosis: Diagnoses of Normocytic anemia and Prostate cancer ([place]) were pertinent to this visit. ? ?Postprocedure diagnosis: same ?EBL:    minimal ?Complications:   none immediate ? ?See full dictation in [facility]. ? ?D. Arne Cleveland MD ?Main # [phone] ?Pager  [phone] ?Mobile [phone] ?  ? ?

## 2021-11-27 NOTE — Progress Notes (Signed)
Patient clinically stable post BMB per Dr Vernard Gambles, tolerated well. Awake/alert and oriented post procedure. Vitals stable pre and post procedure. Report given to [facility] post procedure at bedside/specials. ?

## 2021-11-27 NOTE — Patient Instructions (Signed)
A HOLEP could be an option to potentially help you empty your bladder better and keep you from having to catheterize, or at the very least make catheterization easier.  Unfortunately, there would be a high risk of incontinence and permanent leakage after that surgery with your prostate cancer.  We will provide you with some other catheters to try today to see if this makes catheterization a little easier.  Recommend minimizing any fluids after 5 PM, and catheterize right before going to bed. ? ?Holmium Laser Enucleation of the Prostate (HoLEP) ? ?HoLEP is a treatment for men with benign prostatic hyperplasia (BPH). The laser surgery removed blockages of urine flow, and is done without any incisions on the body. ? ? ?  ?What is HoLEP?  ?HoLEP is a type of laser surgery used to treat obstruction (blockage) of urine flow as a result of benign prostatic hyperplasia (BPH). In men with BPH, the prostate gland is not cancerous, but has become enlarged. An enlarged prostate can result in a number of urinary tract symptoms such as weak urinary stream, difficulty in starting urination, inability to urinate, frequent urination, or getting up at night to urinate. ? ?HoLEP was developed in the 1990's as a more effective and less expensive surgical option for BPH, compared to other surgical options such as laser vaporization(PVP/greenlight laser), transurethral resection of the prostate(TURP), and open simple prostatectomy.  ? ?What happens during a HoLEP? ?? HoLEP requires general anesthesia (?asleep? throughout the procedure).  ?? An antibiotic is given to reduce the risk of infection ?? A surgical instrument called a resectoscope is inserted through the urethra (the tube that carries urine from the bladder). The resectoscope has a camera that allows the surgeon to view the internal structure of the prostate gland, and to see where the incisions are being made during surgery. ?? The laser is inserted into the resectoscope and  is used to enucleate (free up) the enlarged prostate tissue from the capsule (outer shell) and then to seal up any blood vessels. The tissue that has been removed is pushed back into the bladder. ?? A morcellator is placed through the resectoscope, and is used to suction out the prostate tissue that has been pushed into the bladder. ?? When the prostate tissue has been removed, the resectoscope is removed, and a foley catheter is placed to allow healing and drain the urine from the bladder. ?  ? ? ?What happens after a HoLEP? ?? More than 90% of patients go home the same day a few hours after surgery. Less than 10% will be admitted to the hospital overnight for observation to monitor the urine, or if they have other medical problems. ?? Fluid is flushed through the catheter for about 1 hour after surgery to clear any blood from the urine. It is normal to have some blood in the urine after surgery. The need for blood transfusion is extremely rare. ?? Eating and drinking are permitted after the procedure once the patient has fully awakened from anesthesia. ?? The catheter is usually removed 2-3 days after surgery- the patient will come to clinic to have the catheter removed and make sure they can urinate on their own. ?? It is very important to drink lots of fluids after surgery for one week to keep the bladder flushed. ?? At first, there may be some burning with urination, but this typically improved within a few hours to days. Most patients do not have a significant amount of pain, and narcotic pain medications  are rarely needed. ?? Symptoms of urinary frequency, urgency, and even leakage are NORMAL for the first few weeks after surgery as the bladder adjusts after having to work hard against blockage from the prostate for many years. This will improve, but can sometimes take several months. ?? The use of pelvic floor exercises (Kegel exercises) can help improve problems with urinary incontinence.  ?? After catheter  removal, patients will be seen at 6 weeks and 6 months for symptom check ?? No heavy lifting for at least 2-3 weeks after surgery, however patients can walk and do light activities the first day after surgery. Return to work time depends on occupation. ? ? ? ?What are the advantages of HoLEP? ?? HoLEP has been studied in many different parts of the world and has been shown to be a safe and effective procedure. Although there are many types of BPH surgeries available, HoLEP offers a unique advantage in being able to remove a large amount of tissue without any incisions on the body, even in very large prostates, while decreasing the risk of bleeding and providing tissue for pathology (to look for cancer). This decreases the need for blood transfusions during surgery, minimizes hospital stay, and reduces the risk of needing repeat treatment. ? ?What are the side effects of HoLEP? ?? Temporary burning and bleeding during urination. Some blood may be seen in the urine for weeks after surgery and is part of the healing process. ?? Urinary incontinence (inability to control urine flow) is expected in all patients immediately after surgery and they should wear pads for the first few days/weeks. This typically improves over the course of several weeks. Performing Kegel exercises can help decrease leakage from stress maneuvers such as coughing, sneezing, or lifting. The rate of long term leakage is very low. Patients may also have leakage with urgency and this may be treated with medication. The risk of urge incontinence can be dependent on several factors including age, prostate size, symptoms, and other medical problems. ?? Retrograde ejaculation or ?backwards ejaculation.? In 75% of cases, the patient will not see any fluid during ejaculation after surgery. ?? Erectile function is generally not significantly affected.  ? ?What are the risks of HoLEP? ?? Injury to the urethra or development of scar tissue at a later date ??  Injury to the capsule of the prostate (typically treated with longer catheterization). ?? Injury to the bladder or ureteral orifices (where the urine from the kidney drains out) ?? Infection of the bladder, testes, or kidneys ?? Return of urinary obstruction at a later date requiring another operation (<2%) ?? Need for blood transfusion or re-operation due to bleeding ?? Failure to relieve all symptoms and/or need for prolonged catheterization after surgery ?? 5-15% of patients are found to have previously undiagnosed prostate cancer in their specimen. Prostate cancer can be treated after HoLEP. ?? Standard risks of anesthesia including blood clots, heart attacks, etc ? ?When should I call my doctor? ?? Fever over 101.3 degrees ?? Inability to urinate, or large blood clots in the urine  ? ?

## 2021-11-27 NOTE — Progress Notes (Signed)
? ?  11/27/2021 ?2:10 PM  ? ?Cesar Powell ?1931-02-08 ?[id number] ? ?Reason for visit: Follow up metastatic prostate cancer, urinary retention, incontinence ? ?HPI: ?Complex 88 year old male with metastatic prostate cancer who has deferred ADT. Originally treated with radiation in 2005, and was on lupron for biochemical recurrence prior to discontinuing secondary to Powell effects. He has a history of cystolitholopaxy by Dr Pilar Jarvis in 2018. He also underwent brachytherapy and XRT to abdominal nodes in 2021 with Dr Baruch Gouty. ?  ?Recently developed recurrent urinary retention, managed by multiple on call urology providers, unclear from notes if urethral stricture vs BNC vs prostate obstruction. ?  ?He underwent a cystoscopy with me on 06/05/2021 that showed slightly shaggy appearance of the prostatic urethra consistent with prior radiation, but no evidence of stricture and the cystoscope passed easily into the bladder without any resistance.  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  He was started on intermittent catheterization at that time and was originally doing well. ?  ?He currently is catheterizing typically 1 time per day with only some mild incontinence in between, and he does not void spontaneously during the day.  His main issue is overnight when he gets up twice to perform CIC.  He also has some leakage overnight if he does not perform catheterization.  He really is bothered primarily by the overnight issues. ?  ?His PSA continues to rise, most recently 25 on 11/17/2021 from 12 in January 2023.  I personally viewed and interpreted the PET scan performed 11/25/2021 that shows nodal metastasis within the low neck chest and abdomen relatively similar from prior, with some response of the pelvic nodes to prior radiation, as well as significant uptake within the prostate likely relating to local disease. ? ?He also had an MI in October 2022 and remains on  anticoagulation. ? ?I suspect he has a combination of atonic bladder from longstanding obstruction as well as local recurrence of his prostate cancer causing his urinary symptoms with some overflow incontinence.  We provided him with some different catheters to try today to see if he could make his intermittent catheterization a little easier.  I also recommended cutting back on fluids 4 hours prior to bedtime, and catheterizing right before bed.  We discussed other options like suprapubic tube, chronic Foley, or even considering a HOLEP/channel TURP to see if we can make his catheterization easier, or even get him back to spontaneous voiding.  I think we have to have realistic expectations with his age and frailty and metastatic prostate cancer, to try to improve his quality of life without excess risk.  Any outlet procedure would have a very high risk of incontinence. ? ?Continue follow-up every 3 to 4 months to check urinary symptoms, sooner if problems ? ?Billey Co, MD ? ?[facility] ?[address], [address] ?[place], [place] [address] ?[phone] ? ? ?

## 2021-12-02 ENCOUNTER — Telehealth: Payer: Self-pay | Admitting: Family Medicine

## 2021-12-04 ENCOUNTER — Ambulatory Visit (INDEPENDENT_AMBULATORY_CARE_PROVIDER_SITE_OTHER): Payer: Medicare Other | Admitting: Physician Assistant

## 2021-12-04 VITALS — BP 130/61 | HR 99 | Ht 67.5 in | Wt 215.0 lb

## 2021-12-04 DIAGNOSIS — R339 Retention of urine, unspecified: Secondary | ICD-10-CM

## 2021-12-04 DIAGNOSIS — C61 Malignant neoplasm of prostate: Secondary | ICD-10-CM

## 2021-12-04 LAB — BLADDER SCAN AMB NON-IMAGING: Scan Result: 153

## 2021-12-04 NOTE — Progress Notes (Signed)
? ?12/04/2021 ?4:17 PM  ? ?Cesar Powell ?June 08, 1931 ?[id number] ? ?CC: ?Chief Complaint  ?Patient presents with  ? Urinary Retention  ? ?HPI: ?Cesar Powell is a 88 y.o. male with PMH metastatic prostate cancer who has deferred ADT, urinary retention managed with CIC, and urinary leakage who presents today for difficulty with self-catheterization.  ? ?Today he reports he has been unable to successfully advance the catheter past the level of the prostate for the past 3 days.  He states that he feels significant resistance with insertion, however urine does drain from the catheter.  He most recently self catheterized 3 hours ago.  He reports bother from awakening overnight to self catheterize several times due to bladder discomfort.  PVR 153 mL. ? ?PMH: ?Past Medical History:  ?Diagnosis Date  ? Coronary artery disease involving native coronary artery of native heart 08/25/2021  ? H/O prostate cancer   ? was treated with radiation  ? Hypertension   ? Local recurrence of prostate cancer ([place]) 06/21/2004  ? Qualifier: Diagnosis of  By: Fuller Plan CMA (AAMA), [place]    ? Myocardial infarction [facility]) 2022  ? Obstructive sleep apnea 06/22/2007  ? NPSG [place] 1979 Unattended Home sleep Study- 05/10/14- Confirms severe OSA, AHI 41.8/ hr, weight 230 pounds CPAP and also Transcend portable CPAP auto 8-15    ? Sleep apnea 1977  ? uses C-pap   ? ? ?Surgical History: ?Past Surgical History:  ?Procedure Laterality Date  ? ABDOMINAL SURGERY    ? ruptured intestines   ? CATARACT EXTRACTION W/PHACO Left 01/29/2016  ? Procedure: CATARACT EXTRACTION PHACO AND INTRAOCULAR LENS PLACEMENT ([place]) left eye;  Surgeon: Leandrew Koyanagi, MD;  Location: [facility];  Service: Ophthalmology;  Laterality: Left;  RESTOR LENS  ? CATARACT EXTRACTION W/PHACO Right 10/21/2016  ? Procedure: CATARACT EXTRACTION PHACO AND INTRAOCULAR LENS PLACEMENT ([facility])  Right restor toric lens;  Surgeon: Leandrew Koyanagi, MD;  Location: [facility];  Service: Ophthalmology;  Laterality: Right;  restor toric lens  ? CORONARY/GRAFT ACUTE MI REVASCULARIZATION N/A 05/13/2021  ? Procedure: Coronary/Graft Acute MI Revascularization;  Surgeon: Nelva Bush, MD;  Location: [place] [facility];  Service: Cardiovascular;  Laterality: N/A;  ? CYSTOSCOPY WITH LITHOLAPAXY N/A 08/14/2016  ? Procedure: CYSTOSCOPY WITH LITHOLAPAXY;  Surgeon: Nickie Retort, MD;  Location: [facility];  Service: Urology;  Laterality: N/A;  ? FEMUR IM NAIL Right 10/08/2014  ? Procedure: INTRAMEDULLARY (IM) NAIL FEMORAL;  Surgeon: Rod Can, MD;  Location: [facility];  Service: Orthopedics;  Laterality: Right;  PER DANIELLE 110 MIN  ? HOLMIUM LASER APPLICATION  9/76/7341  ? Procedure: HOLMIUM LASER APPLICATION;  Surgeon: Nickie Retort, MD;  Location: [facility];  Service: Urology;;  ? LEFT HEART CATH AND CORONARY ANGIOGRAPHY N/A 05/13/2021  ? Procedure: LEFT HEART CATH AND CORONARY ANGIOGRAPHY;  Surgeon: Nelva Bush, MD;  Location: [facility];  Service: Cardiovascular;  Laterality: N/A;  ? OTHER SURGICAL HISTORY  2006  ? Prostate Radiation Surgery   ? RADIOACTIVE SEED IMPLANT N/A 10/30/2019  ? Procedure: RADIOACTIVE SEED IMPLANT/BRACHYTHERAPY IMPLANT;  Surgeon: Billey Co, MD;  Location: [facility];  Service: Urology;  Laterality: N/A;  41 seeds implanted  ? ? ?Home Medications:  ?Allergies as of 12/04/2021   ? ?   Reactions  ? Gluten Meal Rash  ? ?  ? ?  ?Medication List  ?  ? ?  ? Accurate as of Dec 04, 2021  4:17 PM. If you have  any questions, ask your nurse or doctor.  ?  ?  ? ?  ? ?ascorbic acid 500 MG tablet ?Commonly known as: VITAMIN C ?Take 1,000 mg by mouth daily. ?  ?aspirin 81 MG chewable tablet ?Chew 1 tablet (81 mg total) by mouth daily. ?  ?atorvastatin 80 MG tablet ?Commonly known as: LIPITOR ?Take 1 tablet (80 mg total) by mouth daily. ?  ?isosorbide mononitrate 30 MG 24 hr tablet ?Commonly known as: IMDUR ?Take 15 mg by mouth daily. ?  ?losartan 25 MG  tablet ?Commonly known as: COZAAR ?Take 0.5 tablets (12.5 mg total) by mouth daily. ?  ?tamsulosin 0.4 MG Caps capsule ?Commonly known as: FLOMAX ?Take 1 capsule (0.4 mg total) by mouth daily after supper. ?  ?ticagrelor 90 MG Tabs tablet ?Commonly known as: Brilinta ?Take 1 tablet (90 mg total) by mouth 2 (two) times daily. ?  ?Vitamin D3 50 MCG (2000 UT) Tabs ?Take 2,000 Units by mouth daily. ?  ?ZINC PO ?Take 1 tablet by mouth daily. ?  ? ?  ? ? ?Allergies:  ?Allergies  ?Allergen Reactions  ? Gluten Meal Rash  ? ? ?Family History: ?Family History  ?Problem Relation Age of Onset  ? Heart disease Brother   ? Heart attack Brother   ? Lung cancer Brother   ? Prostate cancer Neg Hx   ? Bladder Cancer Neg Hx   ? Kidney cancer Neg Hx   ? ? ?Social History:  ? reports that he quit smoking about 56 years ago. His smoking use included cigarettes. He has a 15.00 pack-year smoking history. He has been exposed to tobacco smoke. He has never used smokeless tobacco. He reports current alcohol use of about 7.0 standard drinks per week. He reports that he does not use drugs. ? ?Physical Exam: ?BP 130/61   Pulse 99   Ht 5' 7.5" (1.715 m)   Wt 215 lb (97.5 kg)   BMI 33.18 kg/m?   ?Constitutional:  Alert and oriented, no acute distress, nontoxic appearing ?HEENT: [place], AT ?Cardiovascular: No clubbing, cyanosis, or edema ?Respiratory: Normal respiratory effort, no increased work of breathing ?Skin: No rashes, bruises or suspicious lesions ?Neurologic: Grossly intact, no focal deficits, moving all 4 extremities ?Psychiatric: Normal mood and affect ? ?Laboratory Data: ?Results for orders placed or performed in visit on 12/04/21  ?BLADDER SCAN AMB NON-IMAGING  ?Result Value Ref Range  ? Scan Result 153 ml   ? ?Assessment & Plan:   ?1. Incomplete bladder emptying ?Reassured the patient that he is emptying his bladder appropriately with self-catheterization despite concerns for advancing the catheter past the prostate.  No indication  for urgent intervention today.  I provided him with several additional catheters in varying sizes to attempt at home, with which he may find greater success.  We will have him follow-up with Dr. Diamantina Providence later this month to discuss possible channel TURP, though I did explain to him that he would be at risk for increased urinary leakage with this. ?- BLADDER SCAN AMB NON-IMAGING ? ?Return in about 3 weeks (around 12/25/2021) for CIC versus channel TURP discussion with Dr. Diamantina Providence. ? ?Debroah Loop, PA-C ? ?[facility] ?[address], [address] ?[place], [place] [address] ?[phone] ?   ?

## 2021-12-05 ENCOUNTER — Ambulatory Visit (INDEPENDENT_AMBULATORY_CARE_PROVIDER_SITE_OTHER): Payer: Medicare Other

## 2021-12-05 VITALS — Ht 67.5 in | Wt 215.0 lb

## 2021-12-05 DIAGNOSIS — Z0000 Encounter for general adult medical examination without abnormal findings: Secondary | ICD-10-CM | POA: Diagnosis not present

## 2021-12-05 NOTE — Progress Notes (Signed)
? ?Subjective:  ? Cesar Powell is a 88 y.o. male who presents for Medicare Annual/Subsequent preventive examination. ? ?Review of Systems    ?Virtual Visit via Telephone Note ? ?I connected with  [name] on 12/05/21 at 10:00 AM EDT by telephone and verified that I am speaking with the correct person using two identifiers. ? ?Location: ?Patient: Home ?Provider: Office ?Persons participating in the virtual visit: patient/Nurse Health Advisor ?  ?I discussed the limitations, risks, security and privacy concerns of performing an evaluation and management service by telephone and the availability of in person appointments. The patient expressed understanding and agreed to proceed. ? ?Interactive audio and video telecommunications were attempted between this nurse and patient, however failed, due to patient having technical difficulties OR patient did not have access to video capability.  We continued and completed visit with audio only. ? ?Some vital signs may be absent or patient reported.  ? ?[name], LPN  ?Cardiac Risk Factors include: advanced age (>49mn, >>67women);hypertension;male gender ? ?   ?Objective:  ?  ?Today's Vitals  ? 12/05/21 1001  ?Weight: 215 lb (97.5 kg)  ?Height: 5' 7.5" (1.715 m)  ? ?Body mass index is 33.18 kg/m?. ? ? ?  12/05/2021  ? 10:20 AM 11/17/2021  ?  1:50 PM 08/21/2021  ? 12:11 PM 08/11/2021  ? 11:00 AM 06/30/2021  ? 10:24 AM 05/19/2021  ?  3:01 PM 05/14/2021  ?  4:00 PM  ?Advanced Directives  ?Does Patient Have a Medical Advance Directive? No No No No No No No  ?Would patient like information on creating a medical advance directive? No - Patient declined  No - Patient declined No - Patient declined No - Patient declined No - Patient declined No - Patient declined  ? ? ?Current Medications (verified) ?Outpatient Encounter Medications as of 12/05/2021  ?Medication Sig  ? ascorbic acid (VITAMIN C) 500 MG tablet Take 1,000 mg by mouth daily.  ? aspirin 81 MG chewable tablet  Chew 1 tablet (81 mg total) by mouth daily.  ? atorvastatin (LIPITOR) 80 MG tablet Take 1 tablet (80 mg total) by mouth daily.  ? Cholecalciferol (VITAMIN D3) 50 MCG (2000 UT) TABS Take 2,000 Units by mouth daily.  ? isosorbide mononitrate (IMDUR) 30 MG 24 hr tablet Take 15 mg by mouth daily.  ? losartan (COZAAR) 25 MG tablet Take 0.5 tablets (12.5 mg total) by mouth daily.  ? Multiple Vitamins-Minerals (ZINC PO) Take 1 tablet by mouth daily.  ? tamsulosin (FLOMAX) 0.4 MG CAPS capsule Take 1 capsule (0.4 mg total) by mouth daily after supper.  ? ticagrelor (BRILINTA) 90 MG TABS tablet Take 1 tablet (90 mg total) by mouth 2 (two) times daily.  ? ?No facility-administered encounter medications on file as of 12/05/2021.  ? ? ?Allergies (verified) ?Gluten meal  ? ?History: ?Past Medical History:  ?Diagnosis Date  ? Coronary artery disease involving native coronary artery of native heart 08/25/2021  ? H/O prostate cancer   ? was treated with radiation  ? Hypertension   ? Local recurrence of prostate cancer [place] 06/21/2004  ? Qualifier: Diagnosis of  By: FFuller PlanCMA (AAMA), [place]    ? Myocardial infarction ([facility] 2022  ? Obstructive sleep apnea 06/22/2007  ? [place]att[place]- 05/10/14- Confirms severe OSA, AHI 41.8/ hr, weight 230 pounds CPAP and also Transcend portable CPAP auto 8-15    ? Sleep apnea 1977  ? uses C-pap   ? ?  Past Surgical History:  ?Procedure Laterality Date  ? ABDOMINAL SURGERY    ? ruptured intestines   ? CATARACT EXTRACTION W/PHACO Left 01/29/2016  ? Procedure: CATARACT EXTRACTION PHACO AND INTRAOCULAR LENS PLACEMENT ([place]) left eye;  Surgeon: Leandrew Koyanagi, MD;  Location: [place];  Service: Ophthalmology;  Laterality: Left;  RESTOR LENS  ? CATARACT EXTRACTION W/PHACO Right 10/21/2016  ? Procedure: CATARACT EXTRACTION PHACO AND INTRAOCULAR LENS PLACEMENT ([facility])  Right restor toric lens;  Surgeon: Leandrew Koyanagi, MD;  Location: [place];   Service: Ophthalmology;  Laterality: Right;  restor toric lens  ? CORONARY/GRAFT ACUTE MI REVASCULARIZATION N/A 05/13/2021  ? Procedure: Coronary/Graft Acute MI Revascularization;  Surgeon: Nelva Bush, MD;  Location: [place] [facility];  Service: Cardiovascular;  Laterality: N/A;  ? CYSTOSCOPY WITH LITHOLAPAXY N/A 08/14/2016  ? Procedure: CYSTOSCOPY WITH LITHOLAPAXY;  Surgeon: Nickie Retort, MD;  Location: [facility];  Service: Urology;  Laterality: N/A;  ? FEMUR IM NAIL Right 10/08/2014  ? Procedure: INTRAMEDULLARY (IM) NAIL FEMORAL;  Surgeon: Rod Can, MD;  Location: [place];  Service: Orthopedics;  Laterality: Right;  PER DANIELLE 110 MIN  ? HOLMIUM LASER APPLICATION  04/26/2682  ? Procedure: HOLMIUM LASER APPLICATION;  Surgeon: Nickie Retort, MD;  Location: [facility];  Service: Urology;;  ? LEFT HEART CATH AND CORONARY ANGIOGRAPHY N/A 05/13/2021  ? Procedure: LEFT HEART CATH AND CORONARY ANGIOGRAPHY;  Surgeon: Nelva Bush, MD;  Location: [facility];  Service: Cardiovascular;  Laterality: N/A;  ? OTHER SURGICAL HISTORY  2006  ? Prostate Radiation Surgery   ? RADIOACTIVE SEED IMPLANT N/A 10/30/2019  ? Procedure: RADIOACTIVE SEED IMPLANT/BRACHYTHERAPY IMPLANT;  Surgeon: Billey Co, MD;  Location: [facility];  Service: Urology;  Laterality: N/A;  41 seeds implanted  ? ?Family History  ?Problem Relation Age of Onset  ? Heart disease Brother   ? Heart attack Brother   ? Lung cancer Brother   ? Prostate cancer Neg Hx   ? Bladder Cancer Neg Hx   ? Kidney cancer Neg Hx   ? ?Social History  ? ?Socioeconomic History  ? Marital status: Married  ?  Spouse name: Not on file  ? Number of children: Not on file  ? Years of education: Not on file  ? Highest education level: Not on file  ?Occupational History  ? Occupation: retired  ?Tobacco Use  ? Smoking status: Former  ?  Packs/day: 1.00  ?  Years: 15.00  ?  Pack years: 15.00  ?  Types: Cigarettes  ?  Quit date: 09/14/1965  ?  Years since  quitting: 56.2  ?  Passive exposure: Past  ? Smokeless tobacco: Never  ?Vaping Use  ? Vaping Use: Never used  ?Substance and Sexual Activity  ? Alcohol use: Yes  ?  Alcohol/week: 7.0 standard drinks  ?  Types: 7 Glasses of wine per week  ? Drug use: No  ? Sexual activity: Yes  ?  Birth control/protection: None  ?Other Topics Concern  ? Not on file  ?Social History Narrative  ? Not on file  ? ?Social Determinants of Health  ? ?Financial Resource Strain: Low Risk   ? Difficulty of Paying Living Expenses: Not hard at all  ?Food Insecurity: No Food Insecurity  ? Worried About Charity fundraiser in the Last Year: Never true  ? Ran Out of Food in the Last Year: Never true  ?Transportation Needs: No Transportation Needs  ? Lack of Transportation (Medical): No  ?  Lack of Transportation (Non-Medical): No  ?Physical Activity: Insufficiently Active  ? Days of Exercise per Week: 5 days  ? Minutes of Exercise per Session: 10 min  ?Stress: No Stress Concern Present  ? Feeling of Stress : Not at all  ?Social Connections: Moderately Isolated  ? Frequency of Communication with Friends and Family: More than three times a week  ? Frequency of Social Gatherings with Friends and Family: More than three times a week  ? Attends Religious Services: Never  ? Active Member of Clubs or Organizations: No  ? Attends Archivist Meetings: Never  ? Marital Status: Married  ? ? ? ?Clinical Intake: ? ?Pre-visit preparation completed: No ? ?Diabetic?  No ? ?Interpreter Needed?: No ?Activities of Daily Living ? ?  12/05/2021  ? 10:11 AM 11/27/2021  ?  7:48 AM  ?In your present state of health, do you have any difficulty performing the following activities:  ?Hearing? 0 0  ?Vision? 0   ?Difficulty concentrating or making decisions? 0 0  ?Walking or climbing stairs? 0 0  ?Dressing or bathing? 0 0  ?Doing errands, shopping? 0   ?Preparing Food and eating ? N   ?Using the Toilet? N   ?In the past six months, have you accidently leaked urine? Y    ?Comment Wears breifs. Followed by Dr Nickolas Madrid   ?Do you have problems with loss of bowel control? Y   ?Comment Wears breifs. Followed by Dr Louann Sjogren   ?Managing your Medications? N   ?Managing your Finance

## 2021-12-05 NOTE — Patient Instructions (Addendum)
?Mr. Relph , ?Thank you for taking time to come for your Medicare Wellness Visit. I appreciate your ongoing commitment to your health goals. Please review the following plan we discussed and let me know if I can assist you in the future.  ? ?These are the goals we discussed: ? Goals   ? ?   Increase physical activity   ?   Starting 06/03/2018, I will continue to exercise for 30-75 minutes 5 days per week.  ? ?  ?   Patient Stated (pt-stated)   ?   I want to maintain and continue medications as prescribed.  ?  ? ?  ?  ?This is a list of the screening recommended for you and due dates:  ?Health Maintenance  ?Topic Date Due  ? COVID-19 Vaccine (3 - Pfizer risk series) 12/21/2021*  ? Zoster (Shingles) Vaccine (1 of 2) 03/07/2022*  ? Tetanus Vaccine  12/06/2022*  ? Flu Shot  03/03/2022  ? Pneumonia Vaccine  Completed  ? HPV Vaccine  Aged Out  ?*Topic was postponed. The date shown is not the original due date.  ?  ? ?Advanced directives: No Patient deferred ? ?Conditions/risks identified: None ? ?Next appointment: Follow up in one year for your annual wellness visit.  ? ?Preventive Care 86 Years and Older, Male ?Preventive care refers to lifestyle choices and visits with your health care provider that can promote health and wellness. ?What does preventive care include? ?A yearly physical exam. This is also called an annual well check. ?Dental exams once or twice a year. ?Routine eye exams. Ask your health care provider how often you should have your eyes checked. ?Personal lifestyle choices, including: ?Daily care of your teeth and gums. ?Regular physical activity. ?Eating a healthy diet. ?Avoiding tobacco and drug use. ?Limiting alcohol use. ?Practicing safe sex. ?Taking low doses of aspirin every day. ?Taking vitamin and mineral supplements as recommended by your health care provider. ?What happens during an annual well check? ?The services and screenings done by your health care provider during your annual well check  will depend on your age, overall health, lifestyle risk factors, and family history of disease. ?Counseling  ?Your health care provider may ask you questions about your: ?Alcohol use. ?Tobacco use. ?Drug use. ?Emotional well-being. ?Home and relationship well-being. ?Sexual activity. ?Eating habits. ?History of falls. ?Memory and ability to understand (cognition). ?Work and work Statistician. ?Screening  ?You may have the following tests or measurements: ?Height, weight, and BMI. ?Blood pressure. ?Lipid and cholesterol levels. These may be checked every 5 years, or more frequently if you are over 66 years old. ?Skin check. ?Lung cancer screening. You may have this screening every year starting at age 12 if you have a 30-pack-year history of smoking and currently smoke or have quit within the past 15 years. ?Fecal occult blood test (FOBT) of the stool. You may have this test every year starting at age 67. ?Flexible sigmoidoscopy or colonoscopy. You may have a sigmoidoscopy every 5 years or a colonoscopy every 10 years starting at age 64. ?Prostate cancer screening. Recommendations will vary depending on your family history and other risks. ?Hepatitis C blood test. ?Hepatitis B blood test. ?Sexually transmitted disease (STD) testing. ?Diabetes screening. This is done by checking your blood sugar (glucose) after you have not eaten for a while (fasting). You may have this done every 1-3 years. ?Abdominal aortic aneurysm (AAA) screening. You may need this if you are a current or former smoker. ?Osteoporosis. You may be  screened starting at age 57 if you are at high risk. ?Talk with your health care provider about your test results, treatment options, and if necessary, the need for more tests. ?Vaccines  ?Your health care provider may recommend certain vaccines, such as: ?Influenza vaccine. This is recommended every year. ?Tetanus, diphtheria, and acellular pertussis (Tdap, Td) vaccine. You may need a Td booster every 10  years. ?Zoster vaccine. You may need this after age 58. ?Pneumococcal 13-valent conjugate (PCV13) vaccine. One dose is recommended after age 64. ?Pneumococcal polysaccharide (PPSV23) vaccine. One dose is recommended after age 47. ?Talk to your health care provider about which screenings and vaccines you need and how often you need them. ?This information is not intended to replace advice given to you by your health care provider. Make sure you discuss any questions you have with your health care provider. ?Document Released: 08/16/2015 Document Revised: 04/08/2016 Document Reviewed: 05/21/2015 ?Elsevier Interactive Patient Education ? 2017 [employer]. ? ?Fall Prevention in the Home ?Falls can cause injuries. They can happen to people of all ages. There are many things you can do to make your home safe and to help prevent falls. ?What can I do on the outside of my home? ?Regularly fix the edges of walkways and driveways and fix any cracks. ?Remove anything that might make you trip as you walk through a door, such as a raised step or threshold. ?Trim any bushes or trees on the path to your home. ?Use bright outdoor lighting. ?Clear any walking paths of anything that might make someone trip, such as rocks or tools. ?Regularly check to see if handrails are loose or broken. Make sure that both sides of any steps have handrails. ?Any raised decks and porches should have guardrails on the edges. ?Have any leaves, snow, or ice cleared regularly. ?Use sand or salt on walking paths during winter. ?Clean up any spills in your garage right away. This includes oil or grease spills. ?What can I do in the bathroom? ?Use night lights. ?Install grab bars by the toilet and in the tub and shower. Do not use towel bars as grab bars. ?Use non-skid mats or decals in the tub or shower. ?If you need to sit down in the shower, use a plastic, non-slip stool. ?Keep the floor dry. Clean up any water that spills on the floor as soon as it  happens. ?Remove soap buildup in the tub or shower regularly. ?Attach bath mats securely with double-sided non-slip rug tape. ?Do not have throw rugs and other things on the floor that can make you trip. ?What can I do in the bedroom? ?Use night lights. ?Make sure that you have a light by your bed that is easy to reach. ?Do not use any sheets or blankets that are too big for your bed. They should not hang down onto the floor. ?Have a firm chair that has side arms. You can use this for support while you get dressed. ?Do not have throw rugs and other things on the floor that can make you trip. ?What can I do in the kitchen? ?Clean up any spills right away. ?Avoid walking on wet floors. ?Keep items that you use a lot in easy-to-reach places. ?If you need to reach something above you, use a strong step stool that has a grab bar. ?Keep electrical cords out of the way. ?Do not use floor polish or wax that makes floors slippery. If you must use wax, use non-skid floor wax. ?Do not have  throw rugs and other things on the floor that can make you trip. ?What can I do with my stairs? ?Do not leave any items on the stairs. ?Make sure that there are handrails on both sides of the stairs and use them. Fix handrails that are broken or loose. Make sure that handrails are as long as the stairways. ?Check any carpeting to make sure that it is firmly attached to the stairs. Fix any carpet that is loose or worn. ?Avoid having throw rugs at the top or bottom of the stairs. If you do have throw rugs, attach them to the floor with carpet tape. ?Make sure that you have a light switch at the top of the stairs and the bottom of the stairs. If you do not have them, ask someone to add them for you. ?What else can I do to help prevent falls? ?Wear shoes that: ?Do not have high heels. ?Have rubber bottoms. ?Are comfortable and fit you well. ?Are closed at the toe. Do not wear sandals. ?If you use a stepladder: ?Make sure that it is fully opened.  Do not climb a closed stepladder. ?Make sure that both sides of the stepladder are locked into place. ?Ask someone to hold it for you, if possible. ?Clearly mark and make sure that you can see: ?Any grab

## 2021-12-08 ENCOUNTER — Encounter (HOSPITAL_COMMUNITY): Payer: Self-pay | Admitting: Oncology

## 2021-12-09 LAB — SURGICAL PATHOLOGY

## 2021-12-12 ENCOUNTER — Encounter (HOSPITAL_COMMUNITY): Payer: Self-pay

## 2021-12-12 ENCOUNTER — Emergency Department (HOSPITAL_COMMUNITY): Payer: Medicare Other

## 2021-12-12 ENCOUNTER — Observation Stay (HOSPITAL_COMMUNITY)
Admission: EM | Admit: 2021-12-12 | Discharge: 2021-12-13 | Disposition: A | Discharge status: Home / Self Care | Payer: Medicare Other | Attending: Internal Medicine | Admitting: Internal Medicine

## 2021-12-12 ENCOUNTER — Other Ambulatory Visit: Payer: Self-pay

## 2021-12-12 DIAGNOSIS — C61 Malignant neoplasm of prostate: Secondary | ICD-10-CM | POA: Diagnosis present

## 2021-12-12 DIAGNOSIS — I2510 Atherosclerotic heart disease of native coronary artery without angina pectoris: Secondary | ICD-10-CM | POA: Diagnosis present

## 2021-12-12 DIAGNOSIS — R2689 Other abnormalities of gait and mobility: Secondary | ICD-10-CM | POA: Diagnosis not present

## 2021-12-12 DIAGNOSIS — Z7901 Long term (current) use of anticoagulants: Secondary | ICD-10-CM | POA: Insufficient documentation

## 2021-12-12 DIAGNOSIS — W19XXXA Unspecified fall, initial encounter: Secondary | ICD-10-CM | POA: Diagnosis not present

## 2021-12-12 DIAGNOSIS — I10 Essential (primary) hypertension: Secondary | ICD-10-CM | POA: Diagnosis present

## 2021-12-12 DIAGNOSIS — R55 Syncope and collapse: Secondary | ICD-10-CM | POA: Diagnosis present

## 2021-12-12 DIAGNOSIS — N1832 Chronic kidney disease, stage 3b: Secondary | ICD-10-CM | POA: Diagnosis not present

## 2021-12-12 DIAGNOSIS — Z87891 Personal history of nicotine dependence: Secondary | ICD-10-CM | POA: Insufficient documentation

## 2021-12-12 DIAGNOSIS — Z043 Encounter for examination and observation following other accident: Secondary | ICD-10-CM | POA: Diagnosis not present

## 2021-12-12 DIAGNOSIS — D649 Anemia, unspecified: Secondary | ICD-10-CM | POA: Diagnosis not present

## 2021-12-12 DIAGNOSIS — M47812 Spondylosis without myelopathy or radiculopathy, cervical region: Secondary | ICD-10-CM | POA: Diagnosis not present

## 2021-12-12 DIAGNOSIS — Z8546 Personal history of malignant neoplasm of prostate: Secondary | ICD-10-CM | POA: Diagnosis not present

## 2021-12-12 DIAGNOSIS — G4733 Obstructive sleep apnea (adult) (pediatric): Secondary | ICD-10-CM | POA: Diagnosis present

## 2021-12-12 DIAGNOSIS — M4802 Spinal stenosis, cervical region: Secondary | ICD-10-CM | POA: Diagnosis not present

## 2021-12-12 DIAGNOSIS — I129 Hypertensive chronic kidney disease with stage 1 through stage 4 chronic kidney disease, or unspecified chronic kidney disease: Secondary | ICD-10-CM | POA: Diagnosis not present

## 2021-12-12 DIAGNOSIS — Z7982 Long term (current) use of aspirin: Secondary | ICD-10-CM | POA: Diagnosis not present

## 2021-12-12 DIAGNOSIS — R42 Dizziness and giddiness: Secondary | ICD-10-CM | POA: Diagnosis not present

## 2021-12-12 DIAGNOSIS — N179 Acute kidney failure, unspecified: Secondary | ICD-10-CM | POA: Diagnosis present

## 2021-12-12 DIAGNOSIS — Z79899 Other long term (current) drug therapy: Secondary | ICD-10-CM | POA: Diagnosis not present

## 2021-12-12 DIAGNOSIS — R58 Hemorrhage, not elsewhere classified: Secondary | ICD-10-CM | POA: Diagnosis not present

## 2021-12-12 DIAGNOSIS — D638 Anemia in other chronic diseases classified elsewhere: Secondary | ICD-10-CM | POA: Diagnosis present

## 2021-12-12 LAB — COMPREHENSIVE METABOLIC PANEL
ALT: 18 U/L (ref 0–44)
AST: 19 U/L (ref 15–41)
Albumin: 2.8 g/dL — ABNORMAL LOW (ref 3.5–5.0)
Alkaline Phosphatase: 54 U/L (ref 38–126)
Anion gap: 9 (ref 5–15)
BUN: 29 mg/dL — ABNORMAL HIGH (ref 8–23)
CO2: 21 mmol/L — ABNORMAL LOW (ref 22–32)
Calcium: 8.6 mg/dL — ABNORMAL LOW (ref 8.9–10.3)
Chloride: 105 mmol/L (ref 98–111)
Creatinine, Ser: 1.31 mg/dL — ABNORMAL HIGH (ref 0.61–1.24)
GFR, Estimated: 52 mL/min — ABNORMAL LOW (ref >60)
Glucose, Bld: 130 mg/dL — ABNORMAL HIGH (ref 70–99)
Potassium: 4.2 mmol/L (ref 3.5–5.1)
Sodium: 135 mmol/L (ref 135–145)
Total Bilirubin: 0.7 mg/dL (ref 0.3–1.2)
Total Protein: 6.5 g/dL (ref 6.5–8.1)

## 2021-12-12 LAB — CBC WITH DIFFERENTIAL/PLATELET
Abs Immature Granulocytes: 0.2 10*3/uL — ABNORMAL HIGH (ref 0.00–0.07)
Basophils Absolute: 0 10*3/uL (ref 0.0–0.1)
Basophils Relative: 0 %
Eosinophils Absolute: 0.2 10*3/uL (ref 0.0–0.5)
Eosinophils Relative: 2 %
HCT: 22.4 % — ABNORMAL LOW (ref 39.0–52.0)
Hemoglobin: 6.9 g/dL — CL (ref 13.0–17.0)
Immature Granulocytes: 2 %
Lymphocytes Relative: 6 %
Lymphs Abs: 0.5 10*3/uL — ABNORMAL LOW (ref 0.7–4.0)
MCH: 28.4 pg (ref 26.0–34.0)
MCHC: 30.8 g/dL (ref 30.0–36.0)
MCV: 92.2 fL (ref 80.0–100.0)
Monocytes Absolute: 0.7 10*3/uL (ref 0.1–1.0)
Monocytes Relative: 8 %
Neutro Abs: 7.6 10*3/uL (ref 1.7–7.7)
Neutrophils Relative %: 82 %
Platelets: 338 10*3/uL (ref 150–400)
RBC: 2.43 MIL/uL — ABNORMAL LOW (ref 4.22–5.81)
RDW: 16.1 % — ABNORMAL HIGH (ref 11.5–15.5)
WBC: 9.2 10*3/uL (ref 4.0–10.5)
nRBC: 0 % (ref 0.0–0.2)

## 2021-12-12 LAB — PREPARE RBC (CROSSMATCH)

## 2021-12-12 LAB — TROPONIN I (HIGH SENSITIVITY): Troponin I (High Sensitivity): 11 ng/L (ref <18)

## 2021-12-12 LAB — POC OCCULT BLOOD, ED: Fecal Occult Bld: NEGATIVE

## 2021-12-12 MED ORDER — SODIUM CHLORIDE 0.9 % IV SOLN
10.0000 mL/h | Freq: Once | INTRAVENOUS | Status: AC
  Administered 2021-12-12: 10 mL/h via INTRAVENOUS

## 2021-12-12 MED ORDER — ACETAMINOPHEN 325 MG PO TABS: 650.0000 mg | ORAL_TABLET | Freq: Four times a day (QID) | ORAL | Status: DC | PRN

## 2021-12-12 MED ORDER — TAMSULOSIN HCL 0.4 MG PO CAPS: 0.4000 mg | ORAL_CAPSULE | Freq: Every day | ORAL | Status: DC

## 2021-12-12 MED ORDER — ATORVASTATIN CALCIUM 40 MG PO TABS
80.0000 mg | ORAL_TABLET | Freq: Every day | ORAL | Status: DC
  Administered 2021-12-13: 80 mg via ORAL
  Filled 2021-12-12: qty 2

## 2021-12-12 MED ORDER — ACETAMINOPHEN 650 MG RE SUPP
650.0000 mg | Freq: Four times a day (QID) | RECTAL | Status: DC | PRN
Start: 2021-12-12 — End: 2021-12-13

## 2021-12-12 MED ORDER — SODIUM CHLORIDE 0.9% FLUSH
3.0000 mL | Freq: Two times a day (BID) | INTRAVENOUS | Status: DC
  Administered 2021-12-13: 3 mL via INTRAVENOUS

## 2021-12-12 MED ORDER — ONDANSETRON HCL 4 MG PO TABS: 4.0000 mg | ORAL_TABLET | Freq: Four times a day (QID) | ORAL | Status: DC | PRN

## 2021-12-12 MED ORDER — ONDANSETRON HCL 4 MG/2ML IJ SOLN: 4.0000 mg | Freq: Four times a day (QID) | INTRAMUSCULAR | Status: DC | PRN

## 2021-12-12 NOTE — H&P (Signed)
?History and Physical  ? ? ?Cesar Powell QZR:[id number] DOB: 29-Jan-1931 DOA: 12/12/2021 ? ?PCP: Owens Loffler, MD  ?Patient coming from: Home ? ?I have personally briefly reviewed patient's old medical records in [place] ? ?Chief Complaint: Syncope initial vitals showed ? ?HPI: ?Cesar Powell is a 88 y.o. male with medical history significant for CAD s/p PCI/DES to pRCA (05/13/2021), castrate resistant metastatic prostate cancer, HTN, HLD, CKD stage IIIb, normocytic anemia, atonic bladder, and OSA on CPAP who presented to the ED for evaluation after syncopal episode at home. ? ?Patient states over the last 2 days he has had significantly low energy and fatigue.  He was sitting down at home when he felt "uneasy" all over.  He says he did not have any specific chest pain or dyspnea however took a sublingual nitroglycerin.  He then stood up, felt very lightheaded, then passed out.  He hit his head on the counter as he fell.  He says he regained consciousness almost immediately. ? ?He has had bleeding from his scalp laceration.  He denies any other obvious bleeding episode for occasional bloody urine when he self caths.  He denies any hemoptysis, hematemesis, hematochezia, melena.  He does take aspirin and Brilinta for drug-eluting stent placed in October 2022. ? ?He is being worked up for chronic normocytic anemia with recent baseline hemoglobin ranging 7.8-8.7. He had a bone marrow biopsy on 11/27/2021 which showed slightly hypercellular marrow with myeloid hyperplasia.  No leukemia, lymphoma, or high-grade dysplasia identified. ? ?ED Course  Labs/Imaging on admission: I have personally reviewed following labs and imaging studies. ? ?Initial vitals showed BP 152/60, pulse 77, RR 20, temp 97.9 ?F. ? ?Labs show hemoglobin 6.9, hematocrit 22.4, platelets 338,000, WBC 9.2, sodium 135, potassium 4.2, bicarb 21, BUN 29, creatinine 1.31, serum glucose 130, LFTs within normal limits, troponin 11.  FOBT is  negative. ? ?Portable chest x-ray negative for focal consolidation, edema, effusion. ? ?CT head without contrast negative for acute intracranial process. ?CT cervical spine without contrast negative for acute fracture or traumatic listhesis. ? ?Patient was ordered to receive 1 unit PRBC transfusion and the hospitalist service was consulted to admit for further evaluation and management. ? ?Review of Systems: All systems reviewed and are negative except as documented in history of present illness above. ? ? ?Past Medical History:  ?Diagnosis Date  ? Coronary artery disease involving native coronary artery of native heart 08/25/2021  ? H/O prostate cancer   ? was treated with radiation  ? Hypertension   ? Local recurrence of prostate cancer ([place]) 06/21/2004  ? Qualifier: Diagnosis of  By: Fuller Plan CMA (AAMA), [place]    ? Myocardial infarction [facility]) 2022  ? Obstructive sleep apnea 06/22/2007  ? NPSG [place] 1979 Unattended Home sleep Study- 05/10/14- Confirms severe OSA, AHI 41.8/ hr, weight 230 pounds CPAP and also Transcend portable CPAP auto 8-15    ? Sleep apnea 1977  ? uses C-pap   ? ? ?Past Surgical History:  ?Procedure Laterality Date  ? ABDOMINAL SURGERY    ? ruptured intestines   ? CATARACT EXTRACTION W/PHACO Left 01/29/2016  ? Procedure: CATARACT EXTRACTION PHACO AND INTRAOCULAR LENS PLACEMENT ([place]) left eye;  Surgeon: Leandrew Koyanagi, MD;  Location: [place];  Service: Ophthalmology;  Laterality: Left;  RESTOR LENS  ? CATARACT EXTRACTION W/PHACO Right 10/21/2016  ? Procedure: CATARACT EXTRACTION PHACO AND INTRAOCULAR LENS PLACEMENT ([facility])  Right restor toric lens;  Surgeon: Leandrew Koyanagi, MD;  Location: [facility];  Service: Ophthalmology;  Laterality: Right;  restor toric lens  ? CORONARY/GRAFT ACUTE MI REVASCULARIZATION N/A 05/13/2021  ? Procedure: Coronary/Graft Acute MI Revascularization;  Surgeon: Nelva Bush, MD;  Location: [facility];  Service: Cardiovascular;   Laterality: N/A;  ? CYSTOSCOPY WITH LITHOLAPAXY N/A 08/14/2016  ? Procedure: CYSTOSCOPY WITH LITHOLAPAXY;  Surgeon: Nickie Retort, MD;  Location: [facility];  Service: Urology;  Laterality: N/A;  ? FEMUR IM NAIL Right 10/08/2014  ? Procedure: INTRAMEDULLARY (IM) NAIL FEMORAL;  Surgeon: Rod Can, MD;  Location: [place];  Service: Orthopedics;  Laterality: Right;  PER DANIELLE 110 MIN  ? HOLMIUM LASER APPLICATION  1/66/0630  ? Procedure: HOLMIUM LASER APPLICATION;  Surgeon: Nickie Retort, MD;  Location: [facility];  Service: Urology;;  ? LEFT HEART CATH AND CORONARY ANGIOGRAPHY N/A 05/13/2021  ? Procedure: LEFT HEART CATH AND CORONARY ANGIOGRAPHY;  Surgeon: Nelva Bush, MD;  Location: [facility];  Service: Cardiovascular;  Laterality: N/A;  ? OTHER SURGICAL HISTORY  2006  ? Prostate Radiation Surgery   ? RADIOACTIVE SEED IMPLANT N/A 10/30/2019  ? Procedure: RADIOACTIVE SEED IMPLANT/BRACHYTHERAPY IMPLANT;  Surgeon: Billey Co, MD;  Location: [facility];  Service: Urology;  Laterality: N/A;  41 seeds implanted  ? ? ?Social History: ? reports that he quit smoking about 56 years ago. His smoking use included cigarettes. He has a 15.00 pack-year smoking history. He has been exposed to tobacco smoke. He has never used smokeless tobacco. He reports current alcohol use of about 7.0 standard drinks per week. He reports that he does not use drugs. ? ?Allergies  ?Allergen Reactions  ? Gluten Meal Rash  ? ? ?Family History  ?Problem Relation Age of Onset  ? Heart disease Brother   ? Heart attack Brother   ? Lung cancer Brother   ? Prostate cancer Neg Hx   ? Bladder Cancer Neg Hx   ? Kidney cancer Neg Hx   ? ? ? ?Prior to Admission medications   ?Medication Sig Start Date End Date Taking? Authorizing Provider  ?ascorbic acid (VITAMIN C) 500 MG tablet Take 1,000 mg by mouth daily.    [provider]  ?aspirin 81 MG chewable tablet Chew 1 tablet (81 mg total) by mouth daily. 05/16/21 05/16/22   Jennye Boroughs, MD  ?atorvastatin (LIPITOR) 80 MG tablet Take 1 tablet (80 mg total) by mouth daily. 08/25/21   Minna Merritts, MD  ?Cholecalciferol (VITAMIN D3) 50 MCG (2000 UT) TABS Take 2,000 Units by mouth daily.    [provider]  ?isosorbide mononitrate (IMDUR) 30 MG 24 hr tablet Take 15 mg by mouth daily. 11/14/21   [provider]  ?losartan (COZAAR) 25 MG tablet Take 0.5 tablets (12.5 mg total) by mouth daily. 08/25/21   Minna Merritts, MD  ?Multiple Vitamins-Minerals (ZINC PO) Take 1 tablet by mouth daily.    [provider]  ?tamsulosin (FLOMAX) 0.4 MG CAPS capsule Take 1 capsule (0.4 mg total) by mouth daily after supper. 04/16/21   Bonnielee Haff, MD  ?ticagrelor (BRILINTA) 90 MG TABS tablet Take 1 tablet (90 mg total) by mouth 2 (two) times daily. 08/21/21   Furth, Cadence H, PA-C  ? ? ?Physical Exam: ?Vitals:  ? 12/12/21 2200 12/12/21 2209 12/12/21 2215 12/12/21 2224  ?BP: (!) 161/66 (!) 161/66 (!) 161/67 (!) 165/66  ?Pulse: 64 66 66 64  ?Resp: '11 13 16 16  ' ?Temp:  98.5 ?F (36.9 ?C)  98.3 ?F (36.8 ?  C)  ?TempSrc:  Oral  Oral  ?SpO2: 100%  100% 100%  ?Weight:      ?Height:      ? ?Constitutional: Resting in bed with head elevated, NAD, calm, comfortable ?Head: Posterior left scalp laceration with oozing bleeding ?Eyes: PERRL, lids and conjunctivae normal ?ENMT: Mucous membranes are moist. Posterior pharynx clear of any exudate or lesions.Normal dentition.  ?Neck: Neck collar in place.  Normal, supple, no masses. ?Respiratory: clear to auscultation bilaterally, no wheezing, no crackles. Normal respiratory effort. No accessory muscle use.  ?Cardiovascular: Regular rate and rhythm, no murmurs / rubs / gallops. No extremity edema. 2+ pedal pulses. ?Abdomen: no tenderness, no masses palpated.  ?Musculoskeletal: no clubbing / cyanosis. No joint deformity upper and lower extremities. Good ROM, no contractures. Normal muscle tone.  ?Skin: no rashes, lesions, ulcers. No  induration ?Neurologic: Sensation intact. Strength 5/5 in all 4.  ?Psychiatric: Normal judgment and insight. Alert and oriented x 3. Normal mood.  ? ?EKG: Personally reviewed. Sinus rhythm, first-degree AV block, low vol

## 2021-12-12 NOTE — ED Notes (Signed)
Patient to CT.

## 2021-12-12 NOTE — ED Provider Notes (Signed)
I saw and evaluated the patient, reviewed the resident's note and I agree with the findings and plan. ? ?This is an 88 year old male who presents after having a syncopal event just prior to arrival.  Patient states he was standing and became dizzy and lightheaded, had chest tightness and took a nitroglycerin and fell struck his head.  Does take blood thinners.  EMS called.  He has no focal neurological findings at this time.  Will perform cardiac enzymes as well as head imaging. ?  ?Lacretia Leigh, MD ?12/12/21 1824 ? ?

## 2021-12-12 NOTE — Assessment & Plan Note (Signed)
S/p PCI with DES to [facility] 05/13/2021.  Denies any atypical chest pain.  Troponin negative.  EKG unchanged from prior. ?-Holding aspirin/Brilinta for now, resume if no obvious active bleeding declares itself ?-Continue atorvastatin ?

## 2021-12-12 NOTE — Hospital Course (Signed)
Cesar Powell is a 88 y.o. male with medical history significant for CAD s/p PCI/DES to pRCA (05/13/2021), castrate resistant metastatic prostate cancer, HTN, HLD, CKD stage IIIb, normocytic anemia, atonic bladder, and OSA on CPAP who is admitted for symptomatic anemia and syncopal episode. ?

## 2021-12-12 NOTE — Assessment & Plan Note (Signed)
Creatinine slightly increased from recent baseline.  Repeat labs in a.m. after blood transfusion. ?

## 2021-12-12 NOTE — Assessment & Plan Note (Signed)
Holding home losartan and Imdur for now given syncopal episode. ?

## 2021-12-12 NOTE — Assessment & Plan Note (Signed)
Patient with castrate resistant metastatic prostate cancer with associated atonic bladder/incomplete bladder emptying. ?-Patient may self cath ?-Bladder scan as needed ?

## 2021-12-12 NOTE — ED Provider Notes (Signed)
?[place] ?Provider Note ? ? ?CSN: [id number] ?Arrival date & time:    ? ?  ? ?History ? ?Chief Complaint  ?Patient presents with  ? Fall  ? ? ? ? ? ?Fall ?Associated symptoms include chest pain. Pertinent negatives include no abdominal pain and no shortness of breath.  ?Cesar Powell is a 88 y.o. male with a history of CAD, HTN, CKD presenting to the ED after a syncopal episode.  Patient states that he was in his normal state of health when he had some chest tightness and nausea associated.  He then started feeling lightheaded and syncopized, falling backwards and striking his head on the counter.  Patient is on aspirin 81 mg and Brilinta.  Currently in the ED, he states that his chest pain has resolved.  He does complain of a mild headache as well as some pain over the back of his neck.  Denies any other injuries.  Denies any associated shortness of breath or diaphoresis prior to the syncopal episode. ?  ? ?Home Medications ?Prior to Admission medications   ?Medication Sig Start Date End Date Taking? Authorizing Provider  ?aspirin 81 MG chewable tablet Chew 1 tablet (81 mg total) by mouth daily. 05/16/21 05/16/22 Yes Jennye Boroughs, MD  ?atorvastatin (LIPITOR) 80 MG tablet Take 1 tablet (80 mg total) by mouth daily. 08/25/21  Yes Minna Merritts, MD  ?Cholecalciferol (VITAMIN D3) 50 MCG (2000 UT) TABS Take 4,000 Units by mouth daily.   Yes [provider]  ?isosorbide mononitrate (IMDUR) 30 MG 24 hr tablet Take 15 mg by mouth daily. 11/14/21  Yes [provider]  ?losartan (COZAAR) 25 MG tablet Take 0.5 tablets (12.5 mg total) by mouth daily. 08/25/21  Yes Minna Merritts, MD  ?nitroGLYCERIN (NITROSTAT) 0.4 MG SL tablet Place 0.4 mg under the tongue every 5 (five) minutes as needed for chest pain.   Yes [provider]  ?NON FORMULARY CPAP at bedtime   Yes [provider]  ?tamsulosin (FLOMAX) 0.4 MG CAPS capsule Take 1 capsule (0.4  mg total) by mouth daily after supper. 04/16/21  Yes Bonnielee Haff, MD  ?ticagrelor (BRILINTA) 90 MG TABS tablet Take 1 tablet (90 mg total) by mouth 2 (two) times daily. 08/21/21  Yes Furth, Cadence H, PA-C  ?   ? ?Allergies    ?Gluten meal   ? ?Review of Systems   ?Review of Systems  ?Constitutional:  Negative for fever.  ?Respiratory:  Negative for cough and shortness of breath.   ?Cardiovascular:  Positive for chest pain. Negative for palpitations.  ?Gastrointestinal:  Positive for nausea. Negative for abdominal pain and vomiting.  ?Neurological:  Positive for syncope and light-headedness. Negative for seizures.  ? ?Physical Exam ?Updated Vital Signs ?BP (!) 184/68   Pulse 72   Temp 98.3 ?F (36.8 ?C) (Oral)   Resp 14   Ht '5\' 7"'$  (1.702 m)   Wt 97.5 kg   SpO2 100%   BMI 33.67 kg/m?  ?Physical Exam ?Constitutional:   ?   General: He is not in acute distress. ?   Appearance: He is obese. He is not toxic-appearing or diaphoretic.  ?   Comments: Elderly and chronically ill-appearing.  ?HENT:  ?   Head:  ?   Comments: Hematoma over the left parietal scalp with overlying abrasion and some venous oozing. ?   Right Ear: External ear normal.  ?   Left Ear: External ear normal.  ?  Nose: Nose normal.  ?Eyes:  ?   General: No scleral icterus. ?   Extraocular Movements: Extraocular movements intact.  ?   Pupils: Pupils are equal, round, and reactive to light.  ?Neck:  ?   Comments: Mild tenderness to palpation over the midline C-spine without step-offs or deformities. ?Cardiovascular:  ?   Rate and Rhythm: Normal rate and regular rhythm.  ?   Heart sounds: Normal heart sounds. No murmur heard. ?  No friction rub. No gallop.  ?Pulmonary:  ?   Effort: Pulmonary effort is normal. No respiratory distress.  ?   Breath sounds: Normal breath sounds. No stridor. No wheezing, rhonchi or rales.  ?Abdominal:  ?   Palpations: Abdomen is soft.  ?   Tenderness: There is no abdominal tenderness. There is no guarding or rebound.   ?Musculoskeletal:     ?   General: No deformity.  ?   Cervical back: Neck supple.  ?   Right lower leg: Edema (trace) present.  ?   Left lower leg: Edema (trace) present.  ?Skin: ?   General: Skin is warm and dry.  ?Neurological:  ?   General: No focal deficit present.  ?   Mental Status: He is alert and oriented to person, place, and time.  ? ? ?ED Results / Procedures / Treatments   ?Labs ?(all labs ordered are listed, but only abnormal results are displayed) ?Labs Reviewed  ?CBC WITH DIFFERENTIAL/PLATELET - Abnormal; Notable for the following components:  ?    Result Value  ? RBC 2.43 (*)   ? Hemoglobin 6.9 (*)   ? HCT 22.4 (*)   ? RDW 16.1 (*)   ? Lymphs Abs 0.5 (*)   ? Abs Immature Granulocytes 0.20 (*)   ? All other components within normal limits  ?COMPREHENSIVE METABOLIC PANEL - Abnormal; Notable for the following components:  ? CO2 21 (*)   ? Glucose, Bld 130 (*)   ? BUN 29 (*)   ? Creatinine, Ser 1.31 (*)   ? Calcium 8.6 (*)   ? Albumin 2.8 (*)   ? GFR, Estimated 52 (*)   ? All other components within normal limits  ?BASIC METABOLIC PANEL  ?CBC  ?POC OCCULT BLOOD, ED  ?PREPARE RBC (CROSSMATCH)  ?TYPE AND SCREEN  ?TROPONIN I (HIGH SENSITIVITY)  ?TROPONIN I (HIGH SENSITIVITY)  ? ? ?EKG ?EKG Interpretation ? ?Date/Time:  Friday Dec 12 2021 18:27:23 EDT ?Ventricular Rate:  69 ?PR Interval:  233 ?QRS Duration: 102 ?QT Interval:  381 ?QTC Calculation: 409 ?R Axis:   58 ?Text Interpretation: Sinus rhythm Prolonged PR interval Low voltage, precordial leads No significant change since last tracing Confirmed by Lacretia Leigh ([id number]) on 12/12/2021 7:44:46 PM ? ?Radiology ?CT Head Wo Contrast ? ?Result Date: 12/12/2021 ?CLINICAL DATA:  Syncopal episode, fall EXAM: CT HEAD WITHOUT CONTRAST CT CERVICAL SPINE WITHOUT CONTRAST TECHNIQUE: Multidetector CT imaging of the head and cervical spine was performed following the standard protocol without intravenous contrast. Multiplanar CT image reconstructions of the cervical  spine were also generated. RADIATION DOSE REDUCTION: This exam was performed according to the departmental dose-optimization program which includes automated exposure control, adjustment of the mA and/or kV according to patient size and/or use of iterative reconstruction technique. COMPARISON:  CT head 05/13/2021, no prior CT cervical spine. FINDINGS: CT HEAD FINDINGS Brain: No evidence of acute infarction, hemorrhage, cerebral edema, mass, mass effect, or midline shift. No hydrocephalus or extra-axial fluid collection. Periventricular white matter changes, likely the sequela  of chronic small vessel ischemic disease. Vascular: No hyperdense vessel. Atherosclerotic calcifications in the intracranial carotid and vertebral arteries. Skull: Normal. Negative for fracture or focal lesion. Sinuses/Orbits: Mucosal thickening in the left maxillary sinus and anterior ethmoid air cells. Status post bilateral lens replacements. Other: The mastoid air cells are well aerated. CT CERVICAL SPINE FINDINGS Alignment: No listhesis. Skull base and vertebrae: No acute fracture or suspicious osseous lesion. Soft tissues and spinal canal: No prevertebral fluid or swelling. No visible canal hematoma. Disc levels: Multilevel degenerative changes, with mild spinal canal stenosis at C3-C4 and C5-C6. Multilevel uncovertebral and facet arthropathy, which causes severe neural foraminal narrowing bilaterally at C3-C4 and C4-C5, and on the left at C5-C6. Upper chest: No focal pulmonary opacity or pleural effusion. Other: None. IMPRESSION: 1.  No acute intracranial process. 2.  No acute fracture or traumatic listhesis in the cervical spine. Electronically Signed   By: Merilyn Baba M.D.   On: 12/12/2021 19:20  ? ?CT Cervical Spine Wo Contrast ? ?Result Date: 12/12/2021 ?CLINICAL DATA:  Syncopal episode, fall EXAM: CT HEAD WITHOUT CONTRAST CT CERVICAL SPINE WITHOUT CONTRAST TECHNIQUE: Multidetector CT imaging of the head and cervical spine was  performed following the standard protocol without intravenous contrast. Multiplanar CT image reconstructions of the cervical spine were also generated. RADIATION DOSE REDUCTION: This exam was performed according to the d

## 2021-12-12 NOTE — Assessment & Plan Note (Signed)
Low energy and fatigue for several days.  Presenting with hemoglobin 6.9.  Being worked up for normocytic anemia by hematology as an outpatient.  Denies any obvious bleeding episode from scalp laceration after fall earlier today. ?-Transfusing 1 unit PRBC ?-Hold aspirin and Brilinta for now, resume if no obvious bleeding declares itself ?

## 2021-12-12 NOTE — Assessment & Plan Note (Signed)
Continue CPAP nightly. °

## 2021-12-12 NOTE — ED Notes (Signed)
Returned from CT scan.

## 2021-12-12 NOTE — ED Triage Notes (Signed)
Patient states he was feeling tired and not well today. States took a nitro and had a syncopal episode, hitting his head on counter. Patient takes Brilinta.  ?152/60 manual  ?

## 2021-12-12 NOTE — Assessment & Plan Note (Signed)
Likely hypotensive episode provoked by nitroglycerin use in addition to symptomatic anemia.  Hit head on counter during fall resulting in posterior left scalp laceration.  CT head negative for acute pathology. ?-Transfusing 1 unit PRBC as above ?-Hold losartan and Imdur for now ?

## 2021-12-13 ENCOUNTER — Observation Stay (HOSPITAL_BASED_OUTPATIENT_CLINIC_OR_DEPARTMENT_OTHER): Payer: Medicare Other

## 2021-12-13 DIAGNOSIS — R55 Syncope and collapse: Secondary | ICD-10-CM

## 2021-12-13 DIAGNOSIS — D649 Anemia, unspecified: Secondary | ICD-10-CM | POA: Diagnosis not present

## 2021-12-13 LAB — URINALYSIS, ROUTINE W REFLEX MICROSCOPIC
Bilirubin Urine: NEGATIVE
Glucose, UA: NEGATIVE mg/dL
Hgb urine dipstick: NEGATIVE
Ketones, ur: NEGATIVE mg/dL
Nitrite: NEGATIVE
Protein, ur: 100 mg/dL — AB
Specific Gravity, Urine: 1.016 (ref 1.005–1.030)
WBC, UA: >50 WBC/hpf — ABNORMAL HIGH (ref 0–5)
pH: 7 (ref 5.0–8.0)

## 2021-12-13 LAB — CBC
HCT: 24.1 % — ABNORMAL LOW (ref 39.0–52.0)
Hemoglobin: 7.4 g/dL — ABNORMAL LOW (ref 13.0–17.0)
MCH: 27.9 pg (ref 26.0–34.0)
MCHC: 30.7 g/dL (ref 30.0–36.0)
MCV: 90.9 fL (ref 80.0–100.0)
Platelets: 321 10*3/uL (ref 150–400)
RBC: 2.65 MIL/uL — ABNORMAL LOW (ref 4.22–5.81)
RDW: 15.8 % — ABNORMAL HIGH (ref 11.5–15.5)
WBC: 8.5 10*3/uL (ref 4.0–10.5)
nRBC: 0 % (ref 0.0–0.2)

## 2021-12-13 LAB — BASIC METABOLIC PANEL
Anion gap: 7 (ref 5–15)
BUN: 24 mg/dL — ABNORMAL HIGH (ref 8–23)
CO2: 21 mmol/L — ABNORMAL LOW (ref 22–32)
Calcium: 8.1 mg/dL — ABNORMAL LOW (ref 8.9–10.3)
Chloride: 107 mmol/L (ref 98–111)
Creatinine, Ser: 1.01 mg/dL (ref 0.61–1.24)
GFR, Estimated: >60 mL/min (ref >60)
Glucose, Bld: 117 mg/dL — ABNORMAL HIGH (ref 70–99)
Potassium: 3.9 mmol/L (ref 3.5–5.1)
Sodium: 135 mmol/L (ref 135–145)

## 2021-12-13 LAB — TROPONIN I (HIGH SENSITIVITY): Troponin I (High Sensitivity): 13 ng/L (ref <18)

## 2021-12-13 LAB — HEMOGLOBIN AND HEMATOCRIT, BLOOD
HCT: 26.9 % — ABNORMAL LOW (ref 39.0–52.0)
Hemoglobin: 8.7 g/dL — ABNORMAL LOW (ref 13.0–17.0)

## 2021-12-13 LAB — PREPARE RBC (CROSSMATCH)

## 2021-12-13 MED ORDER — CEPHALEXIN 500 MG PO CAPS: 500.0000 mg | ORAL_CAPSULE | Freq: Three times a day (TID) | ORAL | 0 refills | Status: AC

## 2021-12-13 MED ORDER — TRAZODONE HCL 50 MG PO TABS
50.0000 mg | ORAL_TABLET | Freq: Every evening | ORAL | Status: DC | PRN
Start: 2021-12-13 — End: 2021-12-13

## 2021-12-13 MED ORDER — SODIUM CHLORIDE 0.9 % IV BOLUS
500.0000 mL | Freq: Once | INTRAVENOUS | Status: AC
  Administered 2021-12-13: 500 mL via INTRAVENOUS

## 2021-12-13 MED ORDER — DM-GUAIFENESIN ER 30-600 MG PO TB12
1.0000 | ORAL_TABLET | Freq: Two times a day (BID) | ORAL | Status: DC | PRN
  Filled 2021-12-13: qty 1

## 2021-12-13 MED ORDER — SENNOSIDES-DOCUSATE SODIUM 8.6-50 MG PO TABS
1.0000 | ORAL_TABLET | Freq: Every evening | ORAL | Status: DC | PRN
Start: 2021-12-13 — End: 2021-12-13

## 2021-12-13 MED ORDER — METOPROLOL TARTRATE 5 MG/5ML IV SOLN: 5.0000 mg | INTRAVENOUS | Status: DC | PRN

## 2021-12-13 MED ORDER — SODIUM CHLORIDE 0.9% IV SOLUTION: Freq: Once | INTRAVENOUS | Status: AC

## 2021-12-13 MED ORDER — PANTOPRAZOLE SODIUM 40 MG PO TBEC: 40.0000 mg | DELAYED_RELEASE_TABLET | Freq: Two times a day (BID) | ORAL | 0 refills | Status: DC

## 2021-12-13 MED ORDER — IPRATROPIUM-ALBUTEROL 0.5-2.5 (3) MG/3ML IN SOLN
3.0000 mL | RESPIRATORY_TRACT | Status: DC | PRN
Start: 2021-12-13 — End: 2021-12-13

## 2021-12-13 MED ORDER — CEPHALEXIN 250 MG PO CAPS
500.0000 mg | ORAL_CAPSULE | Freq: Three times a day (TID) | ORAL | Status: DC
  Administered 2021-12-13: 500 mg via ORAL
  Filled 2021-12-13: qty 2

## 2021-12-13 MED ORDER — HYDRALAZINE HCL 20 MG/ML IJ SOLN: 10.0000 mg | INTRAMUSCULAR | Status: DC | PRN

## 2021-12-13 NOTE — Progress Notes (Signed)
?PROGRESS NOTE ? ? ? ?Cesar Powell  KKX:[id number] DOB: 11-28-1930 DOA: 12/12/2021 ?PCP: Owens Loffler, MD  ? ?Brief Narrative:  ?88 y.o. male with medical history significant for CAD s/p PCI/DES to pRCA (05/13/2021), castrate resistant metastatic prostate cancer, HTN, HLD, CKD stage IIIb, normocytic anemia, atonic bladder, and OSA on CPAP who is admitted for symptomatic anemia and syncopal episode.  Upon admission hemoglobin was 6.9 requiring 1 unit PRBC transfusion.  CT head and cervical spine were negative.  We will plan for another unit of PRBC transfusion, carotid Dopplers.  PT/OT is also been ordered. ?He has been recommended outpatient colonoscopy ? ? ?Assessment & Plan: ? Principal Problem: ?  Symptomatic anemia ?Active Problems: ?  Syncope ?  Coronary artery disease involving native coronary artery of native heart ?  Chronic kidney disease, stage 3b ([place]) ?  Essential hypertension ?  Prostate cancer ([place]) ?  Obstructive sleep apnea ?  ? ? ?Assessment and Plan: ?* Symptomatic anemia ?-Admission hemoglobin 6.9 status post 1 unit PRBC transfusion.  Hemoglobin this morning 7.4. ?- We will plan on resuming aspirin and Brilinta if no further bleeding ?- Underwent bone marrow biopsy 11/27/2021-slightly hypercellular marrow with myeloid hyperplasia ?May need outpatient colonoscopy/GI evaluation to ensure this is not the source. ? ?Syncope ?-Multifactorial-anemia, nitroglycerin and hypotension ?- Holding home losartan and Imdur ?- Status post 1 unit PRBC transfusion, another unit ordered.  Will aim for hemoglobin greater than 8 ?- Echocardiogram October 2020 showed EF 55% ?- Order carotid Dopplers ?- PT/OT ? ?Coronary artery disease involving native coronary artery of native heart ?S/p PCI with DES to [facility] 05/13/2021.  Denies any atypical chest pain.  Troponin negative.  EKG unchanged from prior. ?-Currently aspirin and Brilinta on hold ?- Continue statin ? ?Chronic kidney disease, stage 2 ?Admission creatinine  1.3, today creatinine of 1.0 ? ?Essential hypertension ?Holding home losartan and Imdur for now given syncopal episode. ? ?Prostate cancer ([place]) ?Patient with castrate resistant metastatic prostate cancer with associated atonic bladder/incomplete bladder emptying. ?-Patient may self cath ?-Bladder scan as needed ? ?Obstructive sleep apnea ?Continue CPAP nightly. ? ? ? ? ?DVT prophylaxis: SCDs Start: 12/12/21 2227 ?Code Status: Full code ?Family Communication:   ? ?Plans for 1 more unit of PRBC transfusion, carotid Dopplers, PT OT.  Thereafter if stable he can be discharged ?  ? ?Subjective: ?Seen and examined at bedside, does not any complaints at rest.  Overall does feel slightly fatigued.  Denies any obvious evidence of blood loss in his stool.  He states his colonoscopy was several years ago. ? ?Review of Systems ?Otherwise negative except as per HPI, including: ?General: Denies fever, chills, night sweats or unintended weight loss. ?Resp: Denies cough, wheezing, shortness of breath. ?Cardiac: Denies chest pain, palpitations, orthopnea, paroxysmal nocturnal dyspnea. ?GI: Denies abdominal pain, nausea, vomiting, diarrhea or constipation ?GU: Denies dysuria, frequency, hesitancy or incontinence ?MS: Denies muscle aches, joint pain or swelling ?Neuro: Denies headache, neurologic deficits (focal weakness, numbness, tingling), abnormal gait ?Psych: Denies anxiety, depression, SI/HI/AVH ?Skin: Denies new rashes or lesions ?ID: Denies sick contacts, exotic exposures, travel ? ?Examination: ? ?General exam: Appears calm and comfortable  ?Respiratory system: Clear to auscultation. Respiratory effort normal. ?Cardiovascular system: S1 & S2 heard, RRR. No JVD, murmurs, rubs, gallops or clicks. No pedal edema. ?Gastrointestinal system: Abdomen is nondistended, soft and nontender. No organomegaly or masses felt. Normal bowel sounds heard. ?Central nervous system: Alert and oriented. No focal neurological  deficits. ?Extremities: Symmetric 5 x 5 power. ?  Skin: No rashes, lesions or ulcers ?Psychiatry: Judgement and insight appear normal. Mood & affect appropriate.  ? ? ? ?Objective: ?Vitals:  ? 12/13/21 0430 12/13/21 0500 12/13/21 0545 12/13/21 0715  ?BP: (!) 172/70 (!) 179/62 (!) 148/54 (!) 168/69  ?Pulse: 62 63 64 74  ?Resp: 13 (!) _0 ?Temp:   97.8 ?F (36.6 ?C)   ?TempSrc:      ?SpO2: 97% 97% 97% 98%  ?Weight:      ?Height:      ? ? ?Intake/Output Summary (Last 24 hours) at 12/13/2021 0731 ?Last data filed at 12/13/2021 0730 ?Gross per 24 hour  ?Intake 404 ml  ?Output 1900 ml  ?Net -1496 ml  ? ?Filed Weights  ? 12/12/21 1857  ?Weight: 97.5 kg  ? ? ? ?Data Reviewed:  ? ?CBC: ?Recent Labs  ?Lab 12/12/21 ?1822 12/13/21 ?0370  ?WBC 9.2 8.5  ?NEUTROABS 7.6  --   ?HGB 6.9* 7.4*  ?HCT 22.4* 24.1*  ?MCV 92.2 90.9  ?PLT 338 321  ? ?Basic Metabolic Panel: ?Recent Labs  ?Lab 12/12/21 ?1822 12/13/21 ?4888  ?NA 135 135  ?K 4.2 3.9  ?CL 105 107  ?CO2 21* 21*  ?GLUCOSE 130* 117*  ?BUN 29* 24*  ?CREATININE 1.31* 1.01  ?CALCIUM 8.6* 8.1*  ? ?GFR: ?Estimated Creatinine Clearance: 55.2 mL/min (by C-G formula based on SCr of 1.01 mg/dL). ?Liver Function Tests: ?Recent Labs  ?Lab 12/12/21 ?1822  ?AST 19  ?ALT 18  ?ALKPHOS 54  ?BILITOT 0.7  ?PROT 6.5  ?ALBUMIN 2.8*  ? ?No results for input(s): LIPASE, AMYLASE in the last 168 hours. ?No results for input(s): AMMONIA in the last 168 hours. ?Coagulation Profile: ?No results for input(s): INR, PROTIME in the last 168 hours. ?Cardiac Enzymes: ?No results for input(s): CKTOTAL, CKMB, CKMBINDEX, TROPONINI in the last 168 hours. ?BNP (last 3 results) ?No results for input(s): PROBNP in the last 8760 hours. ?HbA1C: ?No results for input(s): HGBA1C in the last 72 hours. ?CBG: ?No results for input(s): GLUCAP in the last 168 hours. ?Lipid Profile: ?No results for input(s): CHOL, HDL, LDLCALC, TRIG, CHOLHDL, LDLDIRECT in the last 72 hours. ?Thyroid Function Tests: ?No results for input(s): TSH,  T4TOTAL, FREET4, T3FREE, THYROIDAB in the last 72 hours. ?Anemia Panel: ?No results for input(s): VITAMINB12, FOLATE, FERRITIN, TIBC, IRON, RETICCTPCT in the last 72 hours. ?Sepsis Labs: ?No results for input(s): PROCALCITON, LATICACIDVEN in the last 168 hours. ? ?No results found for this or any previous visit (from the past 240 hour(s)).  ? ? ? ? ? ?Radiology Studies: ?CT Head Wo Contrast ? ?Result Date: 12/12/2021 ?CLINICAL DATA:  Syncopal episode, fall EXAM: CT HEAD WITHOUT CONTRAST CT CERVICAL SPINE WITHOUT CONTRAST TECHNIQUE: Multidetector CT imaging of the head and cervical spine was performed following the standard protocol without intravenous contrast. Multiplanar CT image reconstructions of the cervical spine were also generated. RADIATION DOSE REDUCTION: This exam was performed according to the departmental dose-optimization program which includes automated exposure control, adjustment of the mA and/or kV according to patient size and/or use of iterative reconstruction technique. COMPARISON:  CT head 05/13/2021, no prior CT cervical spine. FINDINGS: CT HEAD FINDINGS Brain: No evidence of acute infarction, hemorrhage, cerebral edema, mass, mass effect, or midline shift. No hydrocephalus or extra-axial fluid collection. Periventricular white matter changes, likely the sequela of chronic small vessel ischemic disease. Vascular: No hyperdense vessel. Atherosclerotic calcifications in the intracranial carotid and vertebral arteries. Skull: Normal. Negative for fracture or focal lesion. Sinuses/Orbits: Mucosal thickening  in the left maxillary sinus and anterior ethmoid air cells. Status post bilateral lens replacements. Other: The mastoid air cells are well aerated. CT CERVICAL SPINE FINDINGS Alignment: No listhesis. Skull base and vertebrae: No acute fracture or suspicious osseous lesion. Soft tissues and spinal canal: No prevertebral fluid or swelling. No visible canal hematoma. Disc levels: Multilevel  degenerative changes, with mild spinal canal stenosis at C3-C4 and C5-C6. Multilevel uncovertebral and facet arthropathy, which causes severe neural foraminal narrowing bilaterally at C3-C4 and C4-C5, and on the left at C5-C6. Up

## 2021-12-13 NOTE — Progress Notes (Signed)
Carotid artery duplex completed. ?Refer to "CV Proc" under chart review to view preliminary results. ? ?12/13/2021 3:42 PM ?Kelby Aline., MHA, RVT, RDCS, RDMS   ?

## 2021-12-13 NOTE — ED Notes (Signed)
Breakfast order placed ?

## 2021-12-13 NOTE — Care Management (Signed)
Patient in with fall  lives with wife with daughter nearby. Daughter in room with patient. . PT recommended  PT and OT home health. Given Medicare choices for home health for medicare.gov Had a previous agency in 2017, would like [facility] if not can go with the agency that he had previouslty Has walker and tub bench at home no further DME needs ?

## 2021-12-13 NOTE — Evaluation (Signed)
Occupational Therapy Evaluation ?Patient Details ?Name: Cesar Powell ?MRN: [id number] ?DOB: 1930-10-31 ?Today's Date: 12/13/2021 ? ? ?History of Present Illness Pt is an 88 y.o. male who presented 12/12/21 s/p syncopal episode due to symptomatic anemia. PMH: CAD s/p PCI/DES to pRCA (05/13/2021), castrate resistant metastatic prostate cancer, HTN, HLD, CKD stage IIIb, normocytic anemia, atonic bladder, and OSA on CPAP  ? ?Clinical Impression ?  ?Pt admitted for concerns listed above. PTA pt reports that he was independent with all ADL's and IADL's, using a cane intermittently. At this time, pt presents with decreased strength and balance deficits. He is requiring the use of a RW and min guard assist for safety. Pain is limiting pt performance as well, as with standing his RLE and mid back pain increase substantially. Recommending HHOT to maximize independence and safety at home. OT will follow acutely.  ?   ? ?Recommendations for follow up therapy are one component of a multi-disciplinary discharge planning process, led by the attending physician.  Recommendations may be updated based on patient status, additional functional criteria and insurance authorization.  ? ?Follow Up Recommendations ? Home health OT  ?  ?Assistance Recommended at Discharge Intermittent Supervision/Assistance  ?Patient can return home with the following A little help with walking and/or transfers;A little help with bathing/dressing/bathroom;Assistance with cooking/housework ? ?  ?Functional Status Assessment ? Patient has had a recent decline in their functional status and demonstrates the ability to make significant improvements in function in a reasonable and predictable amount of time.  ?Equipment Recommendations ? None recommended by OT  ?  ?Recommendations for Other Services   ? ? ?  ?Precautions / Restrictions Precautions ?Precautions: Fall ?Precaution Comments: hx of falls; watch BP ?Restrictions ?Weight Bearing Restrictions: No  ? ?   ? ?Mobility Bed Mobility ?Overal bed mobility: Needs Assistance ?Bed Mobility: Supine to Sit, Sit to Supine ?  ?  ?Supine to sit: Min guard, HOB elevated ?Sit to supine: Min guard, HOB elevated ?  ?General bed mobility comments: Increased time, but pt able to complete all bed mobility at a min guard assist level. ?  ? ?Transfers ?Overall transfer level: Needs assistance ?Equipment used: Rolling walker (2 wheels) ?Transfers: Sit to/from Stand ?Sit to Stand: Min guard ?  ?  ?  ?  ?  ?General transfer comment: Pt coming to stand from stretcher with min guard assist for safety ?  ? ?  ?Balance Overall balance assessment: Needs assistance ?Sitting-balance support: No upper extremity supported, Feet supported ?Sitting balance-Leahy Scale: Good ?Sitting balance - Comments: Able to reach off BOS to donn socks in figure-4 position ?  ?Standing balance support: Bilateral upper extremity supported, During functional activity ?Standing balance-Leahy Scale: Poor ?Standing balance comment: Reliant on UE support in standing ?  ?  ?  ?  ?  ?  ?  ?  ?  ?  ?  ?   ? ?ADL either performed or assessed with clinical judgement  ? ?ADL Overall ADL's : Needs assistance/impaired ?Eating/Feeding: Independent;Sitting ?  ?Grooming: Min guard;Standing ?  ?Upper Body Bathing: Supervision/ safety;Sitting ?  ?Lower Body Bathing: Minimal assistance;Sitting/lateral leans;Sit to/from stand ?  ?Upper Body Dressing : Independent;Sitting ?  ?Lower Body Dressing: Minimal assistance;Sitting/lateral leans;Sit to/from stand ?  ?Toilet Transfer: Min guard;Comfort height toilet ?  ?Toileting- Clothing Manipulation and Hygiene: Min guard;Sitting/lateral lean;Sit to/from stand ?  ?  ?  ?Functional mobility during ADLs: Min guard;Rolling walker (2 wheels) ?General ADL Comments: Pain limiting pt mobility and ADL  performance  ? ? ? ?Vision Baseline Vision/History: 1 Wears glasses ?Ability to See in Adequate Light: 0 Adequate ?Patient Visual Report: No change from  baseline ?Vision Assessment?: No apparent visual deficits  ?   ?Perception   ?  ?Praxis   ?  ? ?Pertinent Vitals/Pain Pain Assessment ?Pain Assessment: Faces ?Faces Pain Scale: Hurts even more ?Pain Location: R lateral mid-thigh, lower back ?Pain Descriptors / Indicators: Discomfort, Grimacing, Guarding ?Pain Intervention(s): Limited activity within patient's tolerance, Monitored during session, Repositioned  ? ? ? ?Hand Dominance Right ?  ?Extremity/Trunk Assessment Upper Extremity Assessment ?Upper Extremity Assessment: Overall WFL for tasks assessed ?  ?Lower Extremity Assessment ?Lower Extremity Assessment: Defer to PT evaluation ?RLE Deficits / Details: pain at lateral mid-thigh impacting weight bearing and AROM tolerance, but able to still complete both without assistance ?  ?Cervical / Trunk Assessment ?Cervical / Trunk Assessment: Other exceptions ?Cervical / Trunk Exceptions: low back pain, reproduced with mobility but no pain with static supine or static standing ?  ?Communication Communication ?Communication: HOH ?  ?Cognition Arousal/Alertness: Awake/alert ?Behavior During Therapy: [facility] for tasks assessed/performed ?Overall Cognitive Status: Within Functional Limits for tasks assessed ?  ?  ?  ?  ?  ?  ?  ?  ?  ?  ?  ?  ?  ?  ?  ?  ?  ?  ?  ?General Comments  VSS on RA - BP noted as supine 152/55 (85) ans as sitting 132/86 (96), no symptoms noted ? ?  ?Exercises   ?  ?Shoulder Instructions    ? ? ?Home Living Family/patient expects to be discharged to:: Private residence ?Living Arrangements: Spouse/significant other ?Available Help at Discharge: Family;Available 24 hours/day ?Type of Home: House ?Home Access: Stairs to enter ?Entrance Stairs-Number of Steps: 3 ?Entrance Stairs-Rails: Left (ascending) ?Home Layout: Two level;Able to live on main level with bedroom/bathroom ?Alternate Level Stairs-Number of Steps: 12-15 ?Alternate Level Stairs-Rails: Can reach both ?Bathroom Shower/Tub: Tub/shower  unit;Walk-in shower ?  ?Bathroom Toilet: Standard ?  ?  ?Home Equipment: Shower seat - built in;Grab bars - toilet;Grab bars - tub/shower;Cane - single point;Cane - quad;Rollator (4 wheels) ?  ?  ?  ? ?  ?Prior Functioning/Environment Prior Level of Function : Independent/Modified Independent;Driving;History of Falls (last six months) ?  ?  ?  ?  ?  ?  ?Mobility Comments: Intermittently uses cane on uneven ground but otherwise no AD. Hx of falls. ?  ?  ? ?  ?  ?OT Problem List: Decreased strength;Decreased activity tolerance;Impaired balance (sitting and/or standing);Decreased knowledge of use of DME or AE;Pain;Obesity ?  ?   ?OT Treatment/Interventions: Self-care/ADL training;Therapeutic exercise;Energy conservation;DME and/or AE instruction;Therapeutic activities;Patient/family education;Balance training  ?  ?OT Goals(Current goals can be found in the care plan section) Acute Rehab OT Goals ?Patient Stated Goal: To reduce pain ?OT Goal Formulation: With patient ?Time For Goal Achievement: 12/27/21 ?Potential to Achieve Goals: Good ?ADL Goals ?Pt Will Perform Grooming: with modified independence;standing ?Pt Will Perform Lower Body Bathing: with modified independence;sitting/lateral leans;sit to/from stand ?Pt Will Perform Lower Body Dressing: with modified independence;sit to/from stand;sitting/lateral leans ?Pt Will Transfer to Toilet: with modified independence;ambulating ?Pt Will Perform Toileting - Clothing Manipulation and hygiene: with modified independence;sitting/lateral leans;sit to/from stand  ?OT Frequency: Min 2X/week ?  ? ?Co-evaluation   ?  ?  ?  ?  ? ?  ?AM-PAC OT "6 Clicks" Daily Activity     ?Outcome Measure Help from another person eating meals?:  None ?Help from another person taking care of personal grooming?: A Little ?Help from another person toileting, which includes using toliet, bedpan, or urinal?: A Little ?Help from another person bathing (including washing, rinsing, drying)?: A  Little ?Help from another person to put on and taking off regular upper body clothing?: A Little ?Help from another person to put on and taking off regular lower body clothing?: A Little ?6 Click Score: 19 ?  ?End of S

## 2021-12-13 NOTE — Discharge Summary (Signed)
Physician Discharge Summary  ?UMAIR ROSILES HWE:[id number] DOB: [birth year] DOA: 12/12/2021 ? ?PCP: Owens Loffler, MD ? ?Admit date: 12/12/2021 ?Discharge date: 12/13/2021 ? ?Admitted From: Home ?Disposition: Home ? ?Recommendations for Outpatient Follow-up:  ?Follow up with PCP in 1-2 weeks ?Please obtain BMP/CBC in 3-5 days with his PCP ?Consider outpatient GI referral for possible colonoscopy/endoscopy ?Would advise him to remain on PPI and follow-up with PCP ?5 days oral Keflex prescribed ?Advised oral hydration ? ? ?Discharge Condition: Stable ?CODE STATUS: Full code ?Diet recommendation: Heart healthy ? ?Brief/Interim Summary: ?88 y.o. male with medical history significant for CAD s/p PCI/DES to pRCA (05/13/2021), castrate resistant metastatic prostate cancer, HTN, HLD, CKD stage IIIb, normocytic anemia, atonic bladder, and OSA on CPAP who is admitted for symptomatic anemia and syncopal episode.  Upon admission hemoglobin was 6.9 requiring 1 unit PRBC transfusion.  CT head and cervical spine were negative.  We will plan for another unit of PRBC transfusion, carotid Dopplers.  PT/OT recommended home health.  After second unit PRBC transfusion hemoglobin remained above 8.0.  He has been recommended to follow-up outpatient gastroenterology for possible need for colonoscopy especially if he continues to drop his hemoglobin.  UA was positive for UTI therefore prescribed Keflex, previous cultures of E. coli reviewed. ?Daughter updated by me. ? ? ? ?Assessment and Plan: ?* Symptomatic anemia ?No obvious signs of blood loss, recently had bone marrow biopsy without significant finding.  Received 2 units of PRBC transfusion in the hospital, hemoglobin remained stable.  Resume aspirin and Brilinta, PPI has been added.  Would benefit from outpatient referral with gastroenterology patient is continue to lose blood or interested in colonoscopy. ? ?Syncope ?Resolved.  Combination of blood pressure medicine, symptomatic  anemia and urinary tract infection.  He had a recent echocardiogram.  Carotid Dopplers are overall negative for any acute significant stenosis. ? ?Coronary artery disease involving native coronary artery of native heart ?S/p PCI with DES to [facility] 05/13/2021.  Denies any atypical chest pain.  Troponin negative.  EKG unchanged from prior. ?We will resume aspirin and Brilinta.  PPI has been added. ?-Continue atorvastatin ? ?Chronic kidney disease, stage 3b ([place]) ?Creatinine stable and improved ? ?Essential hypertension ?Resume home regimen ? ?Prostate cancer ([place]) ?Patient with castrate resistant metastatic prostate cancer with associated atonic bladder/incomplete bladder emptying. ?-Patient may self cath ?-Bladder scan as needed ? ?Obstructive sleep apnea ?Continue CPAP nightly. ? ? ? ? ?  ?Body mass index is 33.67 kg/m?. ? ?  ? ? ? ?Discharge Diagnoses:  ?Principal Problem: ?  Symptomatic anemia ?Active Problems: ?  Syncope ?  Coronary artery disease involving native coronary artery of native heart ?  Chronic kidney disease, stage 3b ([place]) ?  Essential hypertension ?  Prostate cancer ([place]) ?  Obstructive sleep apnea ? ? ? ? ? ?Consultations: ?None ? ?Subjective: ?Feels well no complaints ? ?Discharge Exam: ?Vitals:  ? 12/13/21 1346 12/13/21 1352  ?BP: 132/86 (!) 164/63  ?Pulse: 65 66  ?Resp: 16 (!) 22  ?Temp: 98.3 ?F (36.8 ?C)   ?SpO2:  99%  ? ?Vitals:  ? 12/13/21 1030 12/13/21 1045 12/13/21 1346 12/13/21 1352  ?BP: (!) 164/67 (!) 163/68 132/86 (!) 164/63  ?Pulse: 63 62 65 66  ?Resp: _0 (!) 22  ?Temp:  97.8 ?F (36.6 ?C) 98.3 ?F (36.8 ?C)   ?TempSrc:  Oral Oral   ?SpO2: 97% 99%  99%  ?Weight:      ?Height:      ? ? ?  General: Pt is alert, awake, not in acute distress ?Cardiovascular: RRR, S1/S2 +, no rubs, no gallops ?Respiratory: CTA bilaterally, no wheezing, no rhonchi ?Abdominal: Soft, NT, ND, bowel sounds + ?Extremities: no edema, no cyanosis ? ?Discharge Instructions ? ? ?Allergies as of 12/13/2021   ? ?    Reactions  ? Gluten Meal Rash  ? ?  ? ?  ?Medication List  ?  ? ?TAKE these medications   ? ?aspirin 81 MG chewable tablet ?Chew 1 tablet (81 mg total) by mouth daily. ?  ?atorvastatin 80 MG tablet ?Commonly known as: LIPITOR ?Take 1 tablet (80 mg total) by mouth daily. ?  ?cephALEXin 500 MG capsule ?Commonly known as: KEFLEX ?Take 1 capsule (500 mg total) by mouth every 8 (eight) hours for 5 days. ?  ?isosorbide mononitrate 30 MG 24 hr tablet ?Commonly known as: IMDUR ?Take 15 mg by mouth daily. ?  ?losartan 25 MG tablet ?Commonly known as: COZAAR ?Take 0.5 tablets (12.5 mg total) by mouth daily. ?  ?nitroGLYCERIN 0.4 MG SL tablet ?Commonly known as: NITROSTAT ?Place 0.4 mg under the tongue every 5 (five) minutes as needed for chest pain. ?  ?NON FORMULARY ?CPAP at bedtime ?  ?pantoprazole 40 MG tablet ?Commonly known as: Protonix ?Take 1 tablet (40 mg total) by mouth 2 (two) times daily before a meal for 5 days. ?  ?tamsulosin 0.4 MG Caps capsule ?Commonly known as: FLOMAX ?Take 1 capsule (0.4 mg total) by mouth daily after supper. ?  ?ticagrelor 90 MG Tabs tablet ?Commonly known as: Brilinta ?Take 1 tablet (90 mg total) by mouth 2 (two) times daily. ?  ?Vitamin D3 50 MCG (2000 UT) Tabs ?Take 4,000 Units by mouth daily. ?  ? ?  ? ? Follow-up Information   ? ? Copland, Spencer, MD Follow up in 1 week(s).   ?Specialties: Family Medicine, Sports Medicine ?Contact information: ?[address] ?[place] [address] ?[phone] ? ? ?  ?  ? ? [facility], [facility] Follow up.   ?Contact information: ?[place] [address] ?[phone] ? ? ?  ?  ? ?  ?  ? ?  ? ?Allergies  ?Allergen Reactions  ? Gluten Meal Rash  ? ? ?You were cared for by a hospitalist during your hospital stay. If you have any questions about your discharge medications or the care you received while you were in the hospital after you are discharged, you can call the unit and asked to speak with the hospitalist on call  if the hospitalist that took care of you is not available. Once you are discharged, your primary care physician will handle any further medical issues. Please note that no refills for any discharge medications will be authorized once you are discharged, as it is imperative that you return to your primary care physician (or establish a relationship with a primary care physician if you do not have one) for your aftercare needs so that they can reassess your need for medications and monitor your lab values. ? ? ?Procedures/Studies: ?CT Head Wo Contrast ? ?Result Date: 12/12/2021 ?CLINICAL DATA:  Syncopal episode, fall EXAM: CT HEAD WITHOUT CONTRAST CT CERVICAL SPINE WITHOUT CONTRAST TECHNIQUE: Multidetector CT imaging of the head and cervical spine was performed following the standard protocol without intravenous contrast. Multiplanar CT image reconstructions of the cervical spine were also generated. RADIATION DOSE REDUCTION: This exam was performed according to the departmental dose-optimization program which includes automated exposure control, adjustment of the  mA and/or kV according to patient size and/or use of iterative reconstruction technique. COMPARISON:  CT head 05/13/2021, no prior CT cervical spine. FINDINGS: CT HEAD FINDINGS Brain: No evidence of acute infarction, hemorrhage, cerebral edema, mass, mass effect, or midline shift. No hydrocephalus or extra-axial fluid collection. Periventricular white matter changes, likely the sequela of chronic small vessel ischemic disease. Vascular: No hyperdense vessel. Atherosclerotic calcifications in the intracranial carotid and vertebral arteries. Skull: Normal. Negative for fracture or focal lesion. Sinuses/Orbits: Mucosal thickening in the left maxillary sinus and anterior ethmoid air cells. Status post bilateral lens replacements. Other: The mastoid air cells are well aerated. CT CERVICAL SPINE FINDINGS Alignment: No listhesis. Skull base and vertebrae: No acute  fracture or suspicious osseous lesion. Soft tissues and spinal canal: No prevertebral fluid or swelling. No visible canal hematoma. Disc levels: Multilevel degenerative changes, with mild spinal canal stenosis

## 2021-12-13 NOTE — Care Management Obs Status (Signed)
MEDICARE OBSERVATION STATUS NOTIFICATION ? ? ?Patient Details  ?Name: Cesar Powell ?MRN: [id number] ?Date of Birth: [birth year] ? ? ?Medicare Observation Status Notification Given:  Yes ? ?Consent for signature ? ?Verdell Carmine, RN ?12/13/2021, 1:29 PM ?

## 2021-12-13 NOTE — ED Notes (Signed)
Trauma Response Nurse Documentation ? ? ?Cesar Powell is a 88 y.o. male arriving to [facility] ED via EMS ? ?On Brilinta (ticagrelor) 90 mg bid. Trauma was activated as a Level 2 by ED Charge RN based on the following trauma criteria Elderly patients > 65 with head trauma on anti-coagulation (excluding ASA). Patient cleared for CT by Dr. Zenia Resides. Patient to CT with team. GCS 15. ? ?History  ? Past Medical History:  ?Diagnosis Date  ? Coronary artery disease involving native coronary artery of native heart 08/25/2021  ? H/O prostate cancer   ? was treated with radiation  ? Hypertension   ? Local recurrence of prostate cancer ([place]) 06/21/2004  ? Qualifier: Diagnosis of  By: Fuller Plan CMA (AAMA), [place]    ? Myocardial infarction [facility]) 2022  ? Obstructive sleep apnea 06/22/2007  ? NPSG [place] 1979 Unattended Home sleep Study- 05/10/14- Confirms severe OSA, AHI 41.8/ hr, weight 230 pounds CPAP and also Transcend portable CPAP auto 8-15    ? Sleep apnea 1977  ? uses C-pap   ?  ? Past Surgical History:  ?Procedure Laterality Date  ? ABDOMINAL SURGERY    ? ruptured intestines   ? CATARACT EXTRACTION W/PHACO Left 01/29/2016  ? Procedure: CATARACT EXTRACTION PHACO AND INTRAOCULAR LENS PLACEMENT ([place]) left eye;  Surgeon: Leandrew Koyanagi, MD;  Location: [facility];  Service: Ophthalmology;  Laterality: Left;  RESTOR LENS  ? CATARACT EXTRACTION W/PHACO Right 10/21/2016  ? Procedure: CATARACT EXTRACTION PHACO AND INTRAOCULAR LENS PLACEMENT ([facility])  Right restor toric lens;  Surgeon: Leandrew Koyanagi, MD;  Location: [facility];  Service: Ophthalmology;  Laterality: Right;  restor toric lens  ? CORONARY/GRAFT ACUTE MI REVASCULARIZATION N/A 05/13/2021  ? Procedure: Coronary/Graft Acute MI Revascularization;  Surgeon: Nelva Bush, MD;  Location: [facility];  Service: Cardiovascular;  Laterality: N/A;  ? CYSTOSCOPY WITH LITHOLAPAXY N/A 08/14/2016  ? Procedure: CYSTOSCOPY WITH LITHOLAPAXY;  Surgeon:  Nickie Retort, MD;  Location: [facility] OR[facility];  Service: Urology;  Laterality: N/A;  ? FEMUR IM NAIL Right 10/08/2014  ? Procedure: INTRAMEDULLARY (IM) NAIL FEMORAL;  Surgeon: Rod Can, MD;  Location: [facility];  Service: Orthopedics;  Laterality: Right;  PER DANIELLE 110 MIN  ? HOLMIUM LASER APPLICATION  9/38/1829  ? Procedure: HOLMIUM LASER APPLICATION;  Surgeon: Nickie Retort, MD;  Location: [facility];  Service: Urology;;  ? LEFT HEART CATH AND CORONARY ANGIOGRAPHY N/A 05/13/2021  ? Procedure: LEFT HEART CATH AND CORONARY ANGIOGRAPHY;  Surgeon: Nelva Bush, MD;  Location: [facility];  Service: Cardiovascular;  Laterality: N/A;  ? OTHER SURGICAL HISTORY  2006  ? Prostate Radiation Surgery   ? RADIOACTIVE SEED IMPLANT N/A 10/30/2019  ? Procedure: RADIOACTIVE SEED IMPLANT/BRACHYTHERAPY IMPLANT;  Surgeon: Billey Co, MD;  Location: [facility];  Service: Urology;  Laterality: N/A;  41 seeds implanted  ?  ? ?Initial Focused Assessment (If applicable, or please see trauma documentation): ?- GCS 15 ?- PERRLA ?- Lac to top of head - bleeding controlled ?- PIV established en route ? ?CT's Completed:   ?CT Head and CT C-Spine  ? ?Interventions:  ?- labs, including troponin ?- CXR  ?- CT head and neck ? ?Plan for disposition:  ?Other Awaiting scan results - poss admit for obs ? ?Consults completed:  ?none at 1635. ? ?Event Summary: ?Pt presents after having a syncopal episode resulting in him falling and striking the top of his head.  He states he was feeling tired  and dizzy as well as having some chest tightness.  Cardiac workup.  Neuro intact. ? ? ?Bedside handoff with ED RN [facility].   ? ?Dulcy Fanny W  ?Trauma Response RN ? ?Please call TRN at [phone] for further assistance. ?  ?

## 2021-12-13 NOTE — Evaluation (Signed)
Physical Therapy Evaluation ?Patient Details ?Name: Cesar Powell ?MRN: [id number] ?DOB: 08-27-1930 ?Today's Date: 12/13/2021 ? ?History of Present Illness ? Pt is an 88 y.o. male who presented 12/12/21 s/p syncopal episode due to symptomatic anemia. PMH: CAD s/p PCI/DES to pRCA (05/13/2021), castrate resistant metastatic prostate cancer, HTN, HLD, CKD stage IIIb, normocytic anemia, atonic bladder, and OSA on CPAP ?  ?Clinical Impression ? Pt presents with condition above and deficits mentioned below, see PT Problem List. PTA, he was independent, intermittently using a cane on uneven terrain, but otherwise without an AD. Pt lives with his wife in a 2-level house (can stay on main level) with 3 STE. He drives, but his wife does not. His daughter is currently visiting from [facility]. Currently, pt is limited in mobility by symptomatic decline in BP with changing positions, see below. Pt required UE support in standing to maintain his balance and was educated to use his rollator at home for improved safety. He verbalized understanding. Pt was able to perform all functional mobility at a min guard assist level today, but is at risk for subsequent falls due to noted balance, activity tolerance, and strength deficits. Pt limited this session to only a few steps at EOB holding onto furniture for pt safety due to pt being lightheaded. I suspect pt will progress well with a walker, thus recommending HHPT to address his deficits and reduce his risk for subsequent falls. Will continue to follow acutely. ? ?BP:  ?163/68 supine ?142/62 sitting ?137/59 standing ?132/60 standing ~3 min ?130/59 after taking steps still standing ?162/60 supine end of session ?   ? ?Recommendations for follow up therapy are one component of a multi-disciplinary discharge planning process, led by the attending physician.  Recommendations may be updated based on patient status, additional functional criteria and insurance authorization. ? ?Follow Up  Recommendations Home health PT ? ?  ?Assistance Recommended at Discharge Intermittent Supervision/Assistance  ?Patient can return home with the following ? A little help with walking and/or transfers;Help with stairs or ramp for entrance;A little help with bathing/dressing/bathroom;Assistance with cooking/housework;Assist for transportation ? ?  ?Equipment Recommendations None recommended by PT  ?Recommendations for Other Services ?    ?  ?Functional Status Assessment Patient has had a recent decline in their functional status and demonstrates the ability to make significant improvements in function in a reasonable and predictable amount of time.  ? ?  ?Precautions / Restrictions Precautions ?Precautions: Fall ?Precaution Comments: hx of falls; watch BP ?Restrictions ?Weight Bearing Restrictions: No  ? ?  ? ?Mobility ? Bed Mobility ?Overal bed mobility: Needs Assistance ?Bed Mobility: Supine to Sit, Sit to Supine ?  ?  ?Supine to sit: Min guard, HOB elevated ?Sit to supine: Min guard, HOB elevated ?  ?General bed mobility comments: Increased time, but pt able to complete all bed mobility at a min guard assist level. ?  ? ?Transfers ?Overall transfer level: Needs assistance ?Equipment used:  (bed for support with 1 hand) ?Transfers: Sit to/from Stand ?Sit to Stand: Min guard, From elevated surface ?  ?  ?  ?  ?  ?General transfer comment: Pt coming to stand from stretcher with min guard assist for safety, keeping R UE on elevated HOB edge for support. ?  ? ?Ambulation/Gait ?Ambulation/Gait assistance: Min guard ?Gait Distance (Feet): 2 Feet ?Assistive device:  (furniture) ?Gait Pattern/deviations: Step-through pattern, Decreased stride length, Decreased step length - right, Trunk flexed, Shuffle ?Gait velocity: reduced ?Gait velocity interpretation: <1.31 ft/sec, indicative of  household ambulator ?  ?General Gait Details: Pt with slow, small shuffling steps, holding onto furniture in room for support as was unable to  find a RW in the ED at the time of eval. Pt ambulated anterior <> posterior at EOB for safety as pt did feel lightheaded. Min guard for safety, unsteadiness noted, but no LOB ? ?Stairs ?  ?  ?  ?  ?  ? ?Wheelchair Mobility ?  ? ?Modified Rankin (Stroke Patients Only) ?  ? ?  ? ?Balance Overall balance assessment: Needs assistance ?Sitting-balance support: No upper extremity supported, Feet supported ?Sitting balance-Leahy Scale: Good ?Sitting balance - Comments: Able to reach off BOS to donn socks in figure-4 position ?  ?Standing balance support: Single extremity supported, Bilateral upper extremity supported, During functional activity ?Standing balance-Leahy Scale: Poor ?Standing balance comment: Reliant on UE support in standing ?  ?  ?  ?  ?  ?  ?  ?  ?  ?  ?  ?   ? ? ? ?Pertinent Vitals/Pain Pain Assessment ?Pain Assessment: Faces ?Faces Pain Scale: Hurts even more ?Pain Location: R lateral mid-thigh, lower back ?Pain Descriptors / Indicators: Discomfort, Grimacing, Guarding ?Pain Intervention(s): Limited activity within patient's tolerance, Monitored during session, Repositioned  ? ? ?Home Living Family/patient expects to be discharged to:: Private residence ?Living Arrangements: Spouse/significant other ?Available Help at Discharge: Family;Available 24 hours/day ?Type of Home: House ?Home Access: Stairs to enter ?Entrance Stairs-Rails: Left (ascending) ?Entrance Stairs-Number of Steps: 3 ?Alternate Level Stairs-Number of Steps: 12-15 ?Home Layout: Two level;Able to live on main level with bedroom/bathroom ?Home Equipment: Shower seat - built in;Grab bars - toilet;Grab bars - tub/shower;Cane - single point;Cane - quad;Rollator (4 wheels) ?   ?  ?Prior Function Prior Level of Function : Independent/Modified Independent;Driving;History of Falls (last six months) ?  ?  ?  ?  ?  ?  ?Mobility Comments: Intermittently uses cane on uneven ground but otherwise no AD. Hx of falls. ?  ?  ? ? ?Hand Dominance  ?   ? ?   ?Extremity/Trunk Assessment  ? Upper Extremity Assessment ?Upper Extremity Assessment: Defer to OT evaluation ?  ? ?Lower Extremity Assessment ?Lower Extremity Assessment: RLE deficits/detail ?RLE Deficits / Details: pain at lateral mid-thigh impacting weight bearing and AROM tolerance, but able to still complete both without assistance ?  ? ?Cervical / Trunk Assessment ?Cervical / Trunk Assessment: Other exceptions ?Cervical / Trunk Exceptions: low back pain, reproduced with mobility but no pain with static supine or static standing  ?Communication  ? Communication: HOH  ?Cognition Arousal/Alertness: Awake/alert ?Behavior During Therapy: [facility] for tasks assessed/performed ?Overall Cognitive Status: Within Functional Limits for tasks assessed ?  ?  ?  ?  ?  ?  ?  ?  ?  ?  ?  ?  ?  ?  ?  ?  ?  ?  ?  ? ?  ?General Comments General comments (skin integrity, edema, etc.): BP: 163/68 supine,142/62 sitting, 137/59 standing, 132/60 standing ~3 min, 130/59 after taking steps still standing, 162/60 supine end of session; lightheaded with sitting and standing throughout session; educated pt to use rollator for stability and place to sit if symptomatic to improve safety at home. He verbalized understanding ? ?  ?Exercises    ? ?Assessment/Plan  ?  ?PT Assessment Patient needs continued PT services  ?PT Problem List Decreased strength;Decreased activity tolerance;Decreased balance;Decreased mobility;Decreased range of motion;Cardiopulmonary status limiting activity;Pain ? ?   ?  ?  PT Treatment Interventions DME instruction;Gait training;Stair training;Functional mobility training;Therapeutic activities;Balance training;Therapeutic exercise;Neuromuscular re-education;Patient/family education   ? ?PT Goals (Current goals can be found in the Care Plan section)  ?Acute Rehab PT Goals ?Patient Stated Goal: to get stronger ?PT Goal Formulation: With patient ?Time For Goal Achievement: 12/27/21 ?Potential to Achieve Goals: Good ? ?   ?Frequency Min 3X/week ?  ? ? ?Co-evaluation   ?  ?  ?  ?  ? ? ?  ?AM-PAC PT "6 Clicks" Mobility  ?Outcome Measure Help needed turning from your back to your side while in a flat bed without using bedrails?: A Litt

## 2021-12-13 NOTE — ED Notes (Signed)
This Paramedic assisted patient with self catherization and I obtained a UA from the urine collected. ?

## 2021-12-14 LAB — BPAM RBC
Blood Product Expiration Date: 202305252359
Blood Product Expiration Date: 202305262359
ISSUE DATE / TIME: 202305122201
ISSUE DATE / TIME: 202305130935
Unit Type and Rh: 6200
Unit Type and Rh: 6200

## 2021-12-14 LAB — TYPE AND SCREEN
ABO/RH(D): A POS
Antibody Screen: NEGATIVE
Unit division: 0
Unit division: 0

## 2021-12-16 ENCOUNTER — Inpatient Hospital Stay: Payer: Medicare Other | Attending: Oncology | Admitting: Oncology

## 2021-12-16 ENCOUNTER — Telehealth: Payer: Self-pay | Admitting: Pharmacist

## 2021-12-16 ENCOUNTER — Other Ambulatory Visit (HOSPITAL_COMMUNITY): Payer: Self-pay

## 2021-12-16 ENCOUNTER — Encounter: Payer: Self-pay | Admitting: Oncology

## 2021-12-16 ENCOUNTER — Telehealth: Payer: Self-pay | Admitting: *Deleted

## 2021-12-16 VITALS — BP 140/57 | HR 64 | Temp 98.0°F | Resp 20 | Wt 209.5 lb

## 2021-12-16 DIAGNOSIS — Z7189 Other specified counseling: Secondary | ICD-10-CM

## 2021-12-16 DIAGNOSIS — C61 Malignant neoplasm of prostate: Secondary | ICD-10-CM | POA: Insufficient documentation

## 2021-12-16 DIAGNOSIS — D649 Anemia, unspecified: Secondary | ICD-10-CM | POA: Diagnosis not present

## 2021-12-16 DIAGNOSIS — C7951 Secondary malignant neoplasm of bone: Secondary | ICD-10-CM | POA: Insufficient documentation

## 2021-12-16 NOTE — Progress Notes (Signed)
Pt will like to know why his Hgb keeps decreasing; per daughter is has been a persistent issue for the past two years. C/o back pain 8/10. ?

## 2021-12-16 NOTE — Telephone Encounter (Signed)
Called [facility] spoke to calipered and Dr. Janese Banks is requesting that a [facility] test for MDS be done on the bone marrow biopsy he had done on April 27.  Dr. Janese Banks had told me to asked Dr. Melina Copa about this.  [name] said that he would get the message to Dr. Melina Copa ?

## 2021-12-16 NOTE — Progress Notes (Signed)
? ? ? ?Hematology/Oncology Consult note ?[place]  ?[phone] [phone] ? ?Patient Care Team: ?Owens Loffler, MD as PCP - General ?Leandrew Koyanagi, MD as Referring Physician (Ophthalmology) ?Nickie Retort, MD (Inactive) as Consulting Physician (Urology) ?Noreene Filbert, MD as Radiation Oncologist (Radiation Oncology)  ? ?Name of the patient: Cesar Powell  ?[id number]  ?June 15, 1931  ? ?Date of visit: 12/16/21 ? ?Diagnosis- castrate resistant metastatic prostate cancer ? ?Chief complaint/ Reason for visit-follow-up of prostate cancer and anemia ? ?Heme/Onc history: patient is a 88 year old gentleman with no significant past medical problems.  He has a history of prostate cancer that was diagnosed and treated with radiation in 2005.  At that point he his PSA was about 0.03 for a long time.  Then started on Axiron for low testosterone.  PSA eventually went up to as high as 18 2015.  At that point he was started on Lupron his PSA doubling time is around 10 months. Most recently since October 2018 after his PSA went down from 3.6-1.7 and has been gradually increasing to 1.3,1.5 and 2 indicating a PSA doubling time of 9.9 months.  Testosterone levels continue to be suppressed.  He has not started oral anti androgen therapy yet.  ?  ?patient was last seen by me in September 2019 discussed adding oral antiandrogens like apalutamide versus enzalutamide to Lupron in castrate resistant nonmetastatic prostate cancer.  After discussing risks and benefits patient did not wish to proceed.Patient stopped lupron in feb 2020 but did restart after 1 year in February 2021.  Patient received IMRT for his pelvic lymph nodes. ?  ?Patient has had significant anemia with a hemoglobin that has fluctuated between 8-9 since August 2022.  Prior to that his hemoglobin was stable between 11-12.Results of anemia work-up in the past have been as follows: Ferritin levels elevated in the  400s.  TIBC low at 225 with an iron saturation of 12%.  ANA comprehensive panel negative.  TSH and folate normal.  LDH normal.  Haptoglobin elevated at 427.  B12 levels normal.  Patient had a bone marrow biopsy given his significant anemia which shows slightly hypercellular marrow with myeloid hyperplasia but no evidence of leukemia lymphoma or high-grade dysplasia.  Erythroid precursors were decreased in numbers with occasional atypia.  Granulocytic precursors no overt dysplasia and megakaryocytes quantitatively and qualitatively unremarkable ?  ? ?Interval history-reports ongoing fatigue.  He is trying holistic therapies for his prostate cancer presently ? ?ECOG PS- 2 ?Pain scale- 0 ?Opioid associated constipation- no ? ?Review of systems- Review of Systems  ?Constitutional:  Positive for malaise/fatigue. Negative for chills, fever and weight loss.  ?HENT:  Negative for congestion, ear discharge and nosebleeds.   ?Eyes:  Negative for blurred vision.  ?Respiratory:  Negative for cough, hemoptysis, sputum production, shortness of breath and wheezing.   ?Cardiovascular:  Negative for chest pain, palpitations, orthopnea and claudication.  ?Gastrointestinal:  Negative for abdominal pain, blood in stool, constipation, diarrhea, heartburn, melena, nausea and vomiting.  ?Genitourinary:  Negative for dysuria, flank pain, frequency, hematuria and urgency.  ?Musculoskeletal:  Negative for back pain, joint pain and myalgias.  ?Skin:  Negative for rash.  ?Neurological:  Negative for dizziness, tingling, focal weakness, seizures, weakness and headaches.  ?Endo/Heme/Allergies:  Does not bruise/bleed easily.  ?Psychiatric/Behavioral:  Negative for depression and suicidal ideas. The patient does not have insomnia.    ? ? ? ?Allergies  ?Allergen Reactions  ? Gluten Meal Rash  ? ? ? ?Past Medical History:  ?  Diagnosis Date  ? Coronary artery disease involving native coronary artery of native heart 08/25/2021  ? H/O prostate cancer   ?  was treated with radiation  ? Hypertension   ? Local recurrence of prostate cancer ([place]) 06/21/2004  ? Qualifier: Diagnosis of  By: Fuller Plan CMA (AAMA), [place]    ? Myocardial infarction [facility]) 2022  ? Obstructive sleep apnea 06/22/2007  ? NPSG [place] 1979 Unattended Home sleep Study- 05/10/14- Confirms severe OSA, AHI 41.8/ hr, weight 230 pounds CPAP and also Transcend portable CPAP auto 8-15    ? Sleep apnea 1977  ? uses C-pap   ? ? ? ?Past Surgical History:  ?Procedure Laterality Date  ? ABDOMINAL SURGERY    ? ruptured intestines   ? CATARACT EXTRACTION W/PHACO Left 01/29/2016  ? Procedure: CATARACT EXTRACTION PHACO AND INTRAOCULAR LENS PLACEMENT ([place]) left eye;  Surgeon: Leandrew Koyanagi, MD;  Location: [place];  Service: Ophthalmology;  Laterality: Left;  RESTOR LENS  ? CATARACT EXTRACTION W/PHACO Right 10/21/2016  ? Procedure: CATARACT EXTRACTION PHACO AND INTRAOCULAR LENS PLACEMENT ([facility])  Right restor toric lens;  Surgeon: Leandrew Koyanagi, MD;  Location: [place];  Service: Ophthalmology;  Laterality: Right;  restor toric lens  ? CORONARY/GRAFT ACUTE MI REVASCULARIZATION N/A 05/13/2021  ? Procedure: Coronary/Graft Acute MI Revascularization;  Surgeon: Nelva Bush, MD;  Location: [place] [facility];  Service: Cardiovascular;  Laterality: N/A;  ? CYSTOSCOPY WITH LITHOLAPAXY N/A 08/14/2016  ? Procedure: CYSTOSCOPY WITH LITHOLAPAXY;  Surgeon: Nickie Retort, MD;  Location: [facility];  Service: Urology;  Laterality: N/A;  ? FEMUR IM NAIL Right 10/08/2014  ? Procedure: INTRAMEDULLARY (IM) NAIL FEMORAL;  Surgeon: Rod Can, MD;  Location: [place];  Service: Orthopedics;  Laterality: Right;  PER DANIELLE 110 MIN  ? HOLMIUM LASER APPLICATION  1/61/0960  ? Procedure: HOLMIUM LASER APPLICATION;  Surgeon: Nickie Retort, MD;  Location: [facility];  Service: Urology;;  ? LEFT HEART CATH AND CORONARY ANGIOGRAPHY N/A 05/13/2021  ? Procedure: LEFT HEART CATH AND CORONARY ANGIOGRAPHY;   Surgeon: Nelva Bush, MD;  Location: [place] [facility];  Service: Cardiovascular;  Laterality: N/A;  ? OTHER SURGICAL HISTORY  2006  ? Prostate Radiation Surgery   ? RADIOACTIVE SEED IMPLANT N/A 10/30/2019  ? Procedure: RADIOACTIVE SEED IMPLANT/BRACHYTHERAPY IMPLANT;  Surgeon: Billey Co, MD;  Location: [facility];  Service: Urology;  Laterality: N/A;  41 seeds implanted  ? ? ?Social History  ? ?Socioeconomic History  ? Marital status: Married  ?  Spouse name: Not on file  ? Number of children: Not on file  ? Years of education: Not on file  ? Highest education level: Not on file  ?Occupational History  ? Occupation: retired  ?Tobacco Use  ? Smoking status: Former  ?  Packs/day: 1.00  ?  Years: 15.00  ?  Pack years: 15.00  ?  Types: Cigarettes  ?  Quit date: 09/14/1965  ?  Years since quitting: 56.2  ?  Passive exposure: Past  ? Smokeless tobacco: Never  ?Vaping Use  ? Vaping Use: Never used  ?Substance and Sexual Activity  ? Alcohol use: Yes  ?  Alcohol/week: 7.0 standard drinks  ?  Types: 7 Glasses of wine per week  ? Drug use: No  ? Sexual activity: Yes  ?  Birth control/protection: None  ?Other Topics Concern  ? Not on file  ?Social History Narrative  ? Not on file  ? ?Social Determinants of Health  ? ?Financial  Resource Strain: Low Risk   ? Difficulty of Paying Living Expenses: Not hard at all  ?Food Insecurity: No Food Insecurity  ? Worried About Charity fundraiser in the Last Year: Never true  ? Ran Out of Food in the Last Year: Never true  ?Transportation Needs: No Transportation Needs  ? Lack of Transportation (Medical): No  ? Lack of Transportation (Non-Medical): No  ?Physical Activity: Insufficiently Active  ? Days of Exercise per Week: 5 days  ? Minutes of Exercise per Session: 10 min  ?Stress: No Stress Concern Present  ? Feeling of Stress : Not at all  ?Social Connections: Moderately Isolated  ? Frequency of Communication with Friends and Family: More than three times a week  ? Frequency of  Social Gatherings with Friends and Family: More than three times a week  ? Attends Religious Services: Never  ? Active Member of Clubs or Organizations: No  ? Attends Archivist Meetings: [employer]

## 2021-12-16 NOTE — Telephone Encounter (Signed)
Oral Oncology Pharmacist Encounter ? ?Received new prescription for Orgovyx (relugolix) for the treatment of metastatic castration resistant prostate cancer. Patient had been receiving Lupron injections but is wanted to switch to an oral option. ? ?Of note, per MD note, patient has been resistant to adding on antiandrogen therapy. ? ?Prescription dose and frequency assessed.  ? ?Current medication list in Epic reviewed, no DDIs with relugolix identified. ? ?Evaluated chart and no patient barriers to medication adherence identified.  ? ?Prescription has been e-scribed to the [facility] for benefits analysis and approval. ? ?Oral Oncology Clinic will continue to follow for insurance authorization, copayment issues, initial counseling and start date. ? ? ?Darl Pikes, PharmD, BCPS, BCOP, CPP ?Hematology/Oncology Clinical Pharmacist Practitioner ?[place]/[facility]/AP Oral Chemotherapy Navigation Clinic ?[phone] ? ?12/16/2021 12:01 PM ? ?

## 2021-12-16 NOTE — Telephone Encounter (Signed)
Oral Oncology Pharmacist Encounter ?  ?Received notification from [facility] that prior authorization for Orgovyx is required. ?  ?PA submitted on CMM ?Key [id number]  ?Status is pending ?  ?Oral Oncology Clinic will continue to follow. ?  ?Darl Pikes, PharmD, BCPS, BCOP ?Hematology/Oncology Clinical Pharmacist ?[facility] Oral Chemotherapy Navigation Clinic ?[phone] ? ?12/16/2021 11:56 AM ? ?

## 2021-12-16 NOTE — Telephone Encounter (Signed)
Oral Oncology Pharmacist Encounter ?  ?Prior Authorization for Orgovyx has been approved.   ?  ?PA# [id number] ?Effective dates: 08/03/21 through 12/16/22 ? ?Copay: $733.28 ? ?Patient will need to proceed with manufacturer assistance. ?  ?Oral Oncology Clinic will continue to follow.  ? ?Darl Pikes, PharmD, BCPS. BCOP ?Hematology/Oncology Clinical Pharmacist ?[facility]/AP Oral Chemotherapy Navigation Clinic ?[phone] ? ?12/16/2021 12:20 PM  ?

## 2021-12-17 DIAGNOSIS — M9903 Segmental and somatic dysfunction of lumbar region: Secondary | ICD-10-CM | POA: Diagnosis not present

## 2021-12-17 DIAGNOSIS — M5136 Other intervertebral disc degeneration, lumbar region: Secondary | ICD-10-CM | POA: Diagnosis not present

## 2021-12-17 DIAGNOSIS — M9905 Segmental and somatic dysfunction of pelvic region: Secondary | ICD-10-CM | POA: Diagnosis not present

## 2021-12-17 DIAGNOSIS — M62830 Muscle spasm of back: Secondary | ICD-10-CM | POA: Diagnosis not present

## 2021-12-22 DIAGNOSIS — M9905 Segmental and somatic dysfunction of pelvic region: Secondary | ICD-10-CM | POA: Diagnosis not present

## 2021-12-22 DIAGNOSIS — M9903 Segmental and somatic dysfunction of lumbar region: Secondary | ICD-10-CM | POA: Diagnosis not present

## 2021-12-22 DIAGNOSIS — M5136 Other intervertebral disc degeneration, lumbar region: Secondary | ICD-10-CM | POA: Diagnosis not present

## 2021-12-22 DIAGNOSIS — M62830 Muscle spasm of back: Secondary | ICD-10-CM | POA: Diagnosis not present

## 2021-12-24 ENCOUNTER — Other Ambulatory Visit (HOSPITAL_COMMUNITY): Payer: Self-pay

## 2021-12-24 DIAGNOSIS — M9903 Segmental and somatic dysfunction of lumbar region: Secondary | ICD-10-CM | POA: Diagnosis not present

## 2021-12-24 DIAGNOSIS — M62830 Muscle spasm of back: Secondary | ICD-10-CM | POA: Diagnosis not present

## 2021-12-24 DIAGNOSIS — M9905 Segmental and somatic dysfunction of pelvic region: Secondary | ICD-10-CM | POA: Diagnosis not present

## 2021-12-24 DIAGNOSIS — M5136 Other intervertebral disc degeneration, lumbar region: Secondary | ICD-10-CM | POA: Diagnosis not present

## 2021-12-24 NOTE — Telephone Encounter (Signed)
Oral Oncology Patient Advocate Encounter  Met patient in [place] to complete application for [employer] Patient Assistance Program in an effort to reduce patient's out of pocket expense for Orgovyx to $0.    Application completed and faxed to [phone].   [employer] patient assistance phone number for follow up is [phone].   This encounter will be updated until final determination.   [place] [facility] [place] Phone [phone] Fax [phone] 12/24/2021 12:05 PM

## 2021-12-25 ENCOUNTER — Encounter (HOSPITAL_COMMUNITY): Payer: Self-pay | Admitting: Oncology

## 2021-12-26 DIAGNOSIS — M9903 Segmental and somatic dysfunction of lumbar region: Secondary | ICD-10-CM | POA: Diagnosis not present

## 2021-12-26 DIAGNOSIS — M62830 Muscle spasm of back: Secondary | ICD-10-CM | POA: Diagnosis not present

## 2021-12-26 DIAGNOSIS — M5136 Other intervertebral disc degeneration, lumbar region: Secondary | ICD-10-CM | POA: Diagnosis not present

## 2021-12-26 DIAGNOSIS — M9905 Segmental and somatic dysfunction of pelvic region: Secondary | ICD-10-CM | POA: Diagnosis not present

## 2021-12-30 DIAGNOSIS — M5136 Other intervertebral disc degeneration, lumbar region: Secondary | ICD-10-CM | POA: Diagnosis not present

## 2021-12-30 DIAGNOSIS — M9905 Segmental and somatic dysfunction of pelvic region: Secondary | ICD-10-CM | POA: Diagnosis not present

## 2021-12-30 DIAGNOSIS — M62830 Muscle spasm of back: Secondary | ICD-10-CM | POA: Diagnosis not present

## 2021-12-30 DIAGNOSIS — M9903 Segmental and somatic dysfunction of lumbar region: Secondary | ICD-10-CM | POA: Diagnosis not present

## 2021-12-31 ENCOUNTER — Ambulatory Visit
Admission: RE | Admit: 2021-12-31 | Discharge: 2021-12-31 | Disposition: A | Discharge status: Home / Self Care | Payer: Medicare Other | Source: Ambulatory Visit | Attending: Radiation Oncology | Admitting: Radiation Oncology

## 2021-12-31 ENCOUNTER — Encounter: Payer: Self-pay | Admitting: *Deleted

## 2021-12-31 ENCOUNTER — Encounter: Payer: Self-pay | Admitting: Radiation Oncology

## 2021-12-31 ENCOUNTER — Inpatient Hospital Stay: Payer: Medicare Other

## 2021-12-31 ENCOUNTER — Other Ambulatory Visit: Payer: Self-pay | Admitting: *Deleted

## 2021-12-31 VITALS — BP 130/66 | HR 70 | Temp 98.0°F | Resp 24 | Ht 67.5 in | Wt 200.3 lb

## 2021-12-31 DIAGNOSIS — M549 Dorsalgia, unspecified: Secondary | ICD-10-CM | POA: Insufficient documentation

## 2021-12-31 DIAGNOSIS — D649 Anemia, unspecified: Secondary | ICD-10-CM | POA: Insufficient documentation

## 2021-12-31 DIAGNOSIS — Z08 Encounter for follow-up examination after completed treatment for malignant neoplasm: Secondary | ICD-10-CM | POA: Diagnosis not present

## 2021-12-31 DIAGNOSIS — Z923 Personal history of irradiation: Secondary | ICD-10-CM | POA: Insufficient documentation

## 2021-12-31 DIAGNOSIS — C61 Malignant neoplasm of prostate: Secondary | ICD-10-CM

## 2021-12-31 DIAGNOSIS — C7951 Secondary malignant neoplasm of bone: Secondary | ICD-10-CM | POA: Diagnosis not present

## 2021-12-31 LAB — CBC WITH DIFFERENTIAL/PLATELET
Abs Immature Granulocytes: 0.02 10*3/uL (ref 0.00–0.07)
Basophils Absolute: 0 10*3/uL (ref 0.0–0.1)
Basophils Relative: 0 %
Eosinophils Absolute: 0.1 10*3/uL (ref 0.0–0.5)
Eosinophils Relative: 1 %
HCT: 30.2 % — ABNORMAL LOW (ref 39.0–52.0)
Hemoglobin: 9.5 g/dL — ABNORMAL LOW (ref 13.0–17.0)
Immature Granulocytes: 0 %
Lymphocytes Relative: 5 %
Lymphs Abs: 0.4 10*3/uL — ABNORMAL LOW (ref 0.7–4.0)
MCH: 28.7 pg (ref 26.0–34.0)
MCHC: 31.5 g/dL (ref 30.0–36.0)
MCV: 91.2 fL (ref 80.0–100.0)
Monocytes Absolute: 0.6 10*3/uL (ref 0.1–1.0)
Monocytes Relative: 8 %
Neutro Abs: 6.2 10*3/uL (ref 1.7–7.7)
Neutrophils Relative %: 86 %
Platelets: 287 10*3/uL (ref 150–400)
RBC: 3.31 MIL/uL — ABNORMAL LOW (ref 4.22–5.81)
RDW: 16.2 % — ABNORMAL HIGH (ref 11.5–15.5)
WBC: 7.3 10*3/uL (ref 4.0–10.5)
nRBC: 0 % (ref 0.0–0.2)

## 2021-12-31 LAB — SAMPLE TO BLOOD BANK

## 2021-12-31 NOTE — Progress Notes (Signed)
Radiation Oncology Follow up Note  Name: Cesar Powell   Date:   12/31/2021 MRN:  [id number] DOB: October 14, 1930    This 88 y.o. male presents to the clinic today for 88 year old male now out 2 years from salvage radiation therapy with both external beam as well as I-125 interstitial implant for adenocarcinoma the prostate with biochemical failure.[name]  REFERRING PROVIDER: Owens Loffler, MD  HPI: Patient is now 2 years out from salvage radiation therapy with both I-125 interstitial implant as well as external beam treatment to his prostate and pelvic nodes for recurrent adenocarcinoma of the prostate.  He is gone on to have urinary incontinence just wears a pad.  He is no longer using catheters.  His PSA currently is 25 as of last month..  Patient has declined Eligard and is waiting for approval now oforal relugolix which is an oral antiandrogen agent given once daily at 120 mg.  Patient is also being followed by Dr. Janese Banks for his castrate resistant prostate cancer as well as normocytic anemia he is having some slight back pain although had a recent fall.  And CT scan of the cervical spine showed no evidence of fracture.  He had a PSMA PET scan back in April showing tracer avid nodal metastasis with the low neck chest and abdomen.  His pelvic nodal metastasis has responded well based on serial studies.  COMPLICATIONS OF TREATMENT: none  FOLLOW UP COMPLIANCE: keeps appointments   PHYSICAL EXAM:  BP 130/66 (BP Location: Left Arm, Patient Position: Sitting, Cuff Size: Normal)   Pulse 70   Temp 98 F (36.7 C) (Tympanic)   Resp (!) 24   Ht 5' 7.5" (1.715 m) Comment: stated Ht.  Wt 200 lb 4.8 oz (90.9 kg)   BMI 30.91 kg/m  Well-developed elderly male slightly frail using canes for ambulatory support in NAD.  Well-developed well-nourished patient in NAD. HEENT reveals PERLA, EOMI, discs not visualized.  Oral cavity is clear. No oral mucosal lesions are identified. Neck is clear without evidence of  cervical or supraclavicular adenopathy. Lungs are clear to A&P. Cardiac examination is essentially unremarkable with regular rate and rhythm without murmur rub or thrill. Abdomen is benign with no organomegaly or masses noted. Motor sensory and DTR levels are equal and symmetric in the upper and lower extremities. Cranial nerves II through XII are grossly intact. Proprioception is intact. No peripheral adenopathy or edema is identified. No motor or sensory levels are noted. Crude visual fields are within normal range.  RADIOLOGY RESULTS: CT of the cervical spine CT of his head and PSMA PET scan all reviewed compatible with above-stated findings  PLAN: At this time patient is under care of Dr. Janese Banks for his castrate resistant prostate cancer.  Hopefully his oral antiandrogen therapy.  He is also being followed for his normocytic anemia by Dr. Janese Banks.  He is also followed with urology for his urinary symptoms.  I have asked to see him back in 1 year for follow-up.  Be happy to reevaluate him at any time should he develop painful bony metastasis or other areas that need palliation.  I would like to take this opportunity to thank you for allowing me to participate in the care of your patient.Noreene Filbert, MD

## 2022-01-01 ENCOUNTER — Inpatient Hospital Stay: Payer: Medicare Other

## 2022-01-01 ENCOUNTER — Ambulatory Visit (INDEPENDENT_AMBULATORY_CARE_PROVIDER_SITE_OTHER): Payer: Medicare Other | Admitting: Urology

## 2022-01-01 VITALS — BP 143/63 | HR 111

## 2022-01-01 DIAGNOSIS — M9905 Segmental and somatic dysfunction of pelvic region: Secondary | ICD-10-CM | POA: Diagnosis not present

## 2022-01-01 DIAGNOSIS — R339 Retention of urine, unspecified: Secondary | ICD-10-CM | POA: Diagnosis not present

## 2022-01-01 DIAGNOSIS — R972 Elevated prostate specific antigen [PSA]: Secondary | ICD-10-CM

## 2022-01-01 DIAGNOSIS — C61 Malignant neoplasm of prostate: Secondary | ICD-10-CM

## 2022-01-01 DIAGNOSIS — M5136 Other intervertebral disc degeneration, lumbar region: Secondary | ICD-10-CM | POA: Diagnosis not present

## 2022-01-01 DIAGNOSIS — R32 Unspecified urinary incontinence: Secondary | ICD-10-CM | POA: Diagnosis not present

## 2022-01-01 DIAGNOSIS — Z8546 Personal history of malignant neoplasm of prostate: Secondary | ICD-10-CM | POA: Diagnosis not present

## 2022-01-01 DIAGNOSIS — M9903 Segmental and somatic dysfunction of lumbar region: Secondary | ICD-10-CM | POA: Diagnosis not present

## 2022-01-01 DIAGNOSIS — R399 Unspecified symptoms and signs involving the genitourinary system: Secondary | ICD-10-CM

## 2022-01-01 DIAGNOSIS — M62830 Muscle spasm of back: Secondary | ICD-10-CM | POA: Diagnosis not present

## 2022-01-01 LAB — ERYTHROPOIETIN: Erythropoietin: 22.1 m[IU]/mL — ABNORMAL HIGH (ref 2.6–18.5)

## 2022-01-01 NOTE — Progress Notes (Signed)
   01/01/2022 1:01 PM   Cesar Powell September 23, 1930 [id number]  Reason for visit: Follow up metastatic prostate cancer, urinary retention, incontinence  HPI: Complex 88 year old male with metastatic prostate cancer who has deferred ADT. Originally treated with radiation in 2005, and was on lupron for biochemical recurrence prior to discontinuing secondary to side effects. He has a history of cystolitholopaxy by Dr Pilar Jarvis in 2018. He also underwent brachytherapy and XRT to abdominal nodes in 2021 with Dr Baruch Gouty.  He also remains on dual anticoagulation after an MI in October 2022.  Was recently hospitalized mid May 2023 after a fall and also noted to have anemia requiring blood transfusion.  He was treated for a possible UTI at that time, but no urine culture was sent.  I reviewed the hospital notes.   In the fall he developed recurrent urinary retention, managed by multiple on call urology providers, unclear from notes if urethral stricture vs BNC vs prostate obstruction. He underwent a cystoscopy with me on 06/05/2021 that showed slightly shaggy appearance of the prostatic urethra consistent with prior radiation and possible local recurrence of prostate cancer, but no evidence of stricture and the cystoscope passed easily into the bladder without any resistance.  He was started on intermittent catheterization at that time and was originally doing well.    Over the last months he has had urinary incontinence that is neither urge nor stress incontinence.  He has not been passing a catheter over the last month, and denies any pelvic or penile pain.  He has not really voiding spontaneously.  He is not bothered by the urination aside from the leakage overnight that requires him to get up around 3 AM and change his depends.  He has tried numerous things including Cunningham clamp, condom catheter, and other collection devices without significant improvement.  PVR today is normal at 0 mL.   I had a frank  conversation with the patient that he has had a very complex history with varying urinary retention and incontinence, likely with combination of local recurrence of prostate cancer combined with long-term incomplete bladder emptying, brachytherapy, and radiation.   His PSA continues to rise, most recently 25 on 11/17/2021 from 12 in January 2023.  PET scan performed 11/25/2021 that shows nodal metastasis within the low neck chest and abdomen relatively similar from prior, with some response of the pelvic nodes to prior radiation, as well as significant uptake within the prostate likely relating to local disease.  He continues to follow with oncology.  He also had an MI in October 2022 and remains on anticoagulation.  At this point he would like to continue bladder management with depends for his ongoing incontinence of unclear etiology.  I do not think this is overflow incontinence as he has had multiple bladder scans with less than 150 mL, including 0 ml today.  Return precautions were discussed extensively  Continue follow-up every 3 to 4 months to check urinary symptoms, sooner if problems  Billey Co, [place] [address], [place], [place] [address] [phone]

## 2022-01-06 ENCOUNTER — Inpatient Hospital Stay
Admission: EM | Admit: 2022-01-06 | Discharge: 2022-01-09 | DRG: 690 | Disposition: A | Discharge status: Home Health | Payer: Medicare Other | Attending: Hospitalist | Admitting: Hospitalist

## 2022-01-06 ENCOUNTER — Emergency Department: Payer: Medicare Other

## 2022-01-06 ENCOUNTER — Telehealth: Payer: Self-pay | Admitting: Oncology

## 2022-01-06 ENCOUNTER — Other Ambulatory Visit: Payer: Self-pay

## 2022-01-06 DIAGNOSIS — R531 Weakness: Secondary | ICD-10-CM | POA: Diagnosis not present

## 2022-01-06 DIAGNOSIS — R140 Abdominal distension (gaseous): Secondary | ICD-10-CM

## 2022-01-06 DIAGNOSIS — C61 Malignant neoplasm of prostate: Secondary | ICD-10-CM | POA: Diagnosis not present

## 2022-01-06 DIAGNOSIS — D649 Anemia, unspecified: Secondary | ICD-10-CM | POA: Diagnosis present

## 2022-01-06 DIAGNOSIS — Z7982 Long term (current) use of aspirin: Secondary | ICD-10-CM

## 2022-01-06 DIAGNOSIS — N3000 Acute cystitis without hematuria: Secondary | ICD-10-CM | POA: Diagnosis not present

## 2022-01-06 DIAGNOSIS — E669 Obesity, unspecified: Secondary | ICD-10-CM | POA: Diagnosis present

## 2022-01-06 DIAGNOSIS — Z91018 Allergy to other foods: Secondary | ICD-10-CM | POA: Diagnosis not present

## 2022-01-06 DIAGNOSIS — Z8249 Family history of ischemic heart disease and other diseases of the circulatory system: Secondary | ICD-10-CM | POA: Diagnosis not present

## 2022-01-06 DIAGNOSIS — G4733 Obstructive sleep apnea (adult) (pediatric): Secondary | ICD-10-CM | POA: Diagnosis not present

## 2022-01-06 DIAGNOSIS — I2510 Atherosclerotic heart disease of native coronary artery without angina pectoris: Secondary | ICD-10-CM | POA: Diagnosis present

## 2022-01-06 DIAGNOSIS — Z8546 Personal history of malignant neoplasm of prostate: Secondary | ICD-10-CM | POA: Diagnosis not present

## 2022-01-06 DIAGNOSIS — N179 Acute kidney failure, unspecified: Secondary | ICD-10-CM | POA: Diagnosis present

## 2022-01-06 DIAGNOSIS — R1011 Right upper quadrant pain: Secondary | ICD-10-CM

## 2022-01-06 DIAGNOSIS — I252 Old myocardial infarction: Secondary | ICD-10-CM

## 2022-01-06 DIAGNOSIS — E785 Hyperlipidemia, unspecified: Secondary | ICD-10-CM | POA: Diagnosis present

## 2022-01-06 DIAGNOSIS — N1832 Chronic kidney disease, stage 3b: Secondary | ICD-10-CM

## 2022-01-06 DIAGNOSIS — R5383 Other fatigue: Secondary | ICD-10-CM | POA: Diagnosis not present

## 2022-01-06 DIAGNOSIS — Z955 Presence of coronary angioplasty implant and graft: Secondary | ICD-10-CM

## 2022-01-06 DIAGNOSIS — Z923 Personal history of irradiation: Secondary | ICD-10-CM | POA: Diagnosis not present

## 2022-01-06 DIAGNOSIS — Z87891 Personal history of nicotine dependence: Secondary | ICD-10-CM

## 2022-01-06 DIAGNOSIS — I10 Essential (primary) hypertension: Secondary | ICD-10-CM | POA: Diagnosis present

## 2022-01-06 DIAGNOSIS — Z6832 Body mass index (BMI) 32.0-32.9, adult: Secondary | ICD-10-CM

## 2022-01-06 DIAGNOSIS — R269 Unspecified abnormalities of gait and mobility: Secondary | ICD-10-CM | POA: Diagnosis present

## 2022-01-06 DIAGNOSIS — Z79899 Other long term (current) drug therapy: Secondary | ICD-10-CM

## 2022-01-06 DIAGNOSIS — Z7902 Long term (current) use of antithrombotics/antiplatelets: Secondary | ICD-10-CM

## 2022-01-06 DIAGNOSIS — R55 Syncope and collapse: Secondary | ICD-10-CM | POA: Diagnosis not present

## 2022-01-06 DIAGNOSIS — N390 Urinary tract infection, site not specified: Principal | ICD-10-CM | POA: Diagnosis present

## 2022-01-06 DIAGNOSIS — I129 Hypertensive chronic kidney disease with stage 1 through stage 4 chronic kidney disease, or unspecified chronic kidney disease: Secondary | ICD-10-CM | POA: Diagnosis present

## 2022-01-06 DIAGNOSIS — D631 Anemia in chronic kidney disease: Secondary | ICD-10-CM | POA: Diagnosis present

## 2022-01-06 DIAGNOSIS — N401 Enlarged prostate with lower urinary tract symptoms: Secondary | ICD-10-CM

## 2022-01-06 DIAGNOSIS — R262 Difficulty in walking, not elsewhere classified: Secondary | ICD-10-CM

## 2022-01-06 LAB — CBC
HCT: 29.1 % — ABNORMAL LOW (ref 39.0–52.0)
Hemoglobin: 9 g/dL — ABNORMAL LOW (ref 13.0–17.0)
MCH: 27.5 pg (ref 26.0–34.0)
MCHC: 30.9 g/dL (ref 30.0–36.0)
MCV: 89 fL (ref 80.0–100.0)
Platelets: 301 10*3/uL (ref 150–400)
RBC: 3.27 MIL/uL — ABNORMAL LOW (ref 4.22–5.81)
RDW: 16 % — ABNORMAL HIGH (ref 11.5–15.5)
WBC: 6.4 10*3/uL (ref 4.0–10.5)
nRBC: 0 % (ref 0.0–0.2)

## 2022-01-06 LAB — URINALYSIS, ROUTINE W REFLEX MICROSCOPIC
Bilirubin Urine: NEGATIVE
Glucose, UA: NEGATIVE mg/dL
Ketones, ur: NEGATIVE mg/dL
Nitrite: NEGATIVE
Protein, ur: 100 mg/dL — AB
Specific Gravity, Urine: 1.017 (ref 1.005–1.030)
WBC, UA: >50 WBC/hpf — ABNORMAL HIGH (ref 0–5)
pH: 7 (ref 5.0–8.0)

## 2022-01-06 LAB — BASIC METABOLIC PANEL
Anion gap: 4 — ABNORMAL LOW (ref 5–15)
BUN: 26 mg/dL — ABNORMAL HIGH (ref 8–23)
CO2: 26 mmol/L (ref 22–32)
Calcium: 8.7 mg/dL — ABNORMAL LOW (ref 8.9–10.3)
Chloride: 105 mmol/L (ref 98–111)
Creatinine, Ser: 1.23 mg/dL (ref 0.61–1.24)
GFR, Estimated: 56 mL/min — ABNORMAL LOW (ref >60)
Glucose, Bld: 108 mg/dL — ABNORMAL HIGH (ref 70–99)
Potassium: 4.2 mmol/L (ref 3.5–5.1)
Sodium: 135 mmol/L (ref 135–145)

## 2022-01-06 MED ORDER — TAMSULOSIN HCL 0.4 MG PO CAPS
0.8000 mg | ORAL_CAPSULE | Freq: Every day | ORAL | Status: DC
  Administered 2022-01-07 – 2022-01-08 (×2): 0.8 mg via ORAL
  Filled 2022-01-06 (×2): qty 2

## 2022-01-06 MED ORDER — VITAMIN D 25 MCG (1000 UNIT) PO TABS
4000.0000 [IU] | ORAL_TABLET | Freq: Every day | ORAL | Status: DC
  Administered 2022-01-07 – 2022-01-08 (×2): 4000 [IU] via ORAL
  Filled 2022-01-06 (×2): qty 4

## 2022-01-06 MED ORDER — ONDANSETRON HCL 4 MG/2ML IJ SOLN: 4.0000 mg | Freq: Three times a day (TID) | INTRAMUSCULAR | Status: DC | PRN

## 2022-01-06 MED ORDER — CEFTRIAXONE SODIUM 1 G IJ SOLR
1.0000 g | INTRAMUSCULAR | Status: AC
  Administered 2022-01-06: 1 g via INTRAVENOUS
  Filled 2022-01-06: qty 10

## 2022-01-06 MED ORDER — LOSARTAN POTASSIUM 25 MG PO TABS
12.5000 mg | ORAL_TABLET | Freq: Every day | ORAL | Status: DC
  Administered 2022-01-07: 12.5 mg via ORAL
  Filled 2022-01-06: qty 1

## 2022-01-06 MED ORDER — ENOXAPARIN SODIUM 60 MG/0.6ML IJ SOSY
0.5000 mg/kg | PREFILLED_SYRINGE | INTRAMUSCULAR | Status: DC
  Administered 2022-01-06 – 2022-01-08 (×3): 47.5 mg via SUBCUTANEOUS
  Filled 2022-01-06 (×3): qty 0.6

## 2022-01-06 MED ORDER — ATORVASTATIN CALCIUM 20 MG PO TABS
80.0000 mg | ORAL_TABLET | Freq: Every day | ORAL | Status: DC
  Administered 2022-01-06 – 2022-01-08 (×3): 80 mg via ORAL
  Filled 2022-01-06 (×3): qty 4

## 2022-01-06 MED ORDER — SODIUM CHLORIDE 0.9 % IV SOLN
1.0000 g | INTRAVENOUS | Status: DC
  Administered 2022-01-07 – 2022-01-08 (×2): 1 g via INTRAVENOUS
  Filled 2022-01-06 (×2): qty 1
  Filled 2022-01-06: qty 10

## 2022-01-06 MED ORDER — PANTOPRAZOLE SODIUM 40 MG PO TBEC
40.0000 mg | DELAYED_RELEASE_TABLET | Freq: Two times a day (BID) | ORAL | Status: DC
  Administered 2022-01-07 – 2022-01-08 (×4): 40 mg via ORAL
  Filled 2022-01-06 (×4): qty 1

## 2022-01-06 MED ORDER — ISOSORBIDE MONONITRATE ER 30 MG PO TB24
15.0000 mg | ORAL_TABLET | Freq: Every day | ORAL | Status: DC
  Administered 2022-01-07 – 2022-01-08 (×2): 15 mg via ORAL
  Filled 2022-01-06 (×2): qty 1

## 2022-01-06 MED ORDER — NITROGLYCERIN 0.4 MG SL SUBL: 0.4000 mg | SUBLINGUAL_TABLET | SUBLINGUAL | Status: DC | PRN

## 2022-01-06 MED ORDER — HYDRALAZINE HCL 20 MG/ML IJ SOLN
5.0000 mg | INTRAMUSCULAR | Status: DC | PRN
  Administered 2022-01-06: 5 mg via INTRAVENOUS
  Filled 2022-01-06: qty 1

## 2022-01-06 MED ORDER — TICAGRELOR 90 MG PO TABS
90.0000 mg | ORAL_TABLET | Freq: Two times a day (BID) | ORAL | Status: DC
  Administered 2022-01-06 – 2022-01-08 (×5): 90 mg via ORAL
  Filled 2022-01-06 (×6): qty 1

## 2022-01-06 MED ORDER — ASPIRIN 81 MG PO CHEW
81.0000 mg | CHEWABLE_TABLET | Freq: Every day | ORAL | Status: DC
  Administered 2022-01-07 – 2022-01-08 (×2): 81 mg via ORAL
  Filled 2022-01-06 (×2): qty 1

## 2022-01-06 MED ORDER — ACETAMINOPHEN 325 MG PO TABS: 650.0000 mg | ORAL_TABLET | Freq: Four times a day (QID) | ORAL | Status: DC | PRN

## 2022-01-06 NOTE — Assessment & Plan Note (Addendum)
S/p of DES. -on ASA -Brilinta -lipitor, Imdur

## 2022-01-06 NOTE — ED Triage Notes (Signed)
Pt c/o generalized weakness , not able to get out of bed the past couple of days, states he has a hx of anemia with blood transfusion and was suppose to get one last week but they canceled it , labs show hbg 9.5 on 5/32 above the pt average. Pt is a/ox4 on arrival

## 2022-01-06 NOTE — Assessment & Plan Note (Signed)
Baseline hemoglobin 8-9.  His hemoglobin is 9.0, at baseline -Follow-up with CBC

## 2022-01-06 NOTE — ED Provider Notes (Signed)
[facility] Provider Note    Event Date/Time   First MD Initiated Contact with Patient 01/06/22 1122     (approximate)   History   Weakness   HPI  Cesar Powell is a 88 y.o. male who on my review of discharge summary from May 13 CAD s/p PCI/DES to pRCA (05/13/2021), castrate resistant metastatic prostate cancer, HTN, HLD, CKD stage IIIb, normocytic anemia, atonic bladder, and OSA on CPAP who is admitted for symptomatic anemia and syncopal episode on previous hospitalization.  Also noted to have urinary tract infection treated with Keflex  Several weeks ago he had a fall episode of passing out.  He has been sore since then but doing well.  About 3 days ago he started feeling just more fatigued and generally weak.  No trouble speaking no numbness or weakness in any 1 area.    No chest pain no trouble breathing.  He is just started feeling very fatigued and he is to the point now that he is not strong enough to get himself up out of bed, he had to call for assistance today.  He lives with his wife but his wife is unable to assist him out of bed either.  Still eating and drinking no fevers, has pain with urination but reports that is a daily item for a long time due to issues with his prostate but has not noticed any fever unusual odor or signs of infection that he knows of.  Also, he has noticed that it seems like his bellybutton is protruding out which is new for him in the last few weeks.  Denies abdominal pain now.  Reported it seems like his belly feels a little bit swollen      Physical Exam   Triage Vital Signs: ED Triage Vitals [01/06/22 0937]  Enc Vitals Group     BP (!) 147/63     Pulse Rate 83     Resp 17     Temp 98.3 F (36.8 C)     Temp Source Oral     SpO2 100 %     Weight 206 lb (93.4 kg)     Height '5\' 7"'$  (1.702 m)     Head Circumference      Peak Flow      Pain Score 1     Pain Loc      Pain Edu?      Excl. in [place]?     Most  recent vital signs: Vitals:   01/06/22 0937 01/06/22 1119  BP: (!) 147/63 (!) 155/57  Pulse: 83 66  Resp: 17 14  Temp: 98.3 F (36.8 C)   SpO2: 100% 98%     General: Awake, no distress.  Well oriented.  Pleasant.  Appears somewhat fatigued but in no acute distress.  Appears slightly pale and chronically ill CV:  Good peripheral perfusion.  Normal heart tone Resp:  Normal effort.  Clear lung sounds bilaterally.  Speaks in full clear sentences without distress. Abd:  No distention.  Abdomen is slightly protuberant though.  He has a soft easily reducible umbilical hernia versus possible ascites.  No obvious fluid wave.  Denies pain to palpation in any quadrant.  No peritonitis or rebound tenderness.   Other:  No edema of the lower extremity Equal facial expressions.  Clear speech.  No slurring.  Moves all extremities with equal strength bilaterally with about 4 out of 5 in the lower extremities bilateral.  5 out of  5 upper extremities.  Appears generally fatigued and muscularly weak or possibly deconditioned.  No neurosensory deficits on the arms or legs bilaterally.  ED Results / Procedures / Treatments   Labs (all labs ordered are listed, but only abnormal results are displayed) Labs Reviewed  BASIC METABOLIC PANEL - Abnormal; Notable for the following components:      Result Value   Glucose, Bld 108 (*)    BUN 26 (*)    Calcium 8.7 (*)    GFR, Estimated 56 (*)    Anion gap 4 (*)    All other components within normal limits  CBC - Abnormal; Notable for the following components:   RBC 3.27 (*)    Hemoglobin 9.0 (*)    HCT 29.1 (*)    RDW 16.0 (*)    All other components within normal limits  URINALYSIS, ROUTINE W REFLEX MICROSCOPIC - Abnormal; Notable for the following components:   Color, Urine YELLOW (*)    APPearance TURBID (*)    Hgb urine dipstick SMALL (*)    Protein, ur 100 (*)    Leukocytes,Ua MODERATE (*)    WBC, UA >50 (*)    Bacteria, UA MANY (*)    All other  components within normal limits  URINE CULTURE  CBG MONITORING, ED     EKG  Interpreted by me at 945 Heart rate 69 QRS 89 QTc 400 Normal sinus rhythm, first-degree AV block.  No evidence of acute ischemia   RADIOLOGY  Chest x-ray interpreted by me as negative for acute finding  [place] ASCITES (ABDOMEN LIMITED)  Result Date: 01/06/2022 CLINICAL DATA:  Abdominal distension question ascites EXAM: LIMITED ABDOMEN ULTRASOUND FOR ASCITES TECHNIQUE: Limited ultrasound survey for ascites was performed in all four abdominal quadrants. COMPARISON:  CT chest abdomen pelvis 04/02/2021 FINDINGS: Survey imaging of the 4 quadrants was performed. No ascites identified. IMPRESSION: No ascites. Electronically Signed   By: Lavonia Dana M.D.   On: 01/06/2022 14:44   US Abdomen Limited RUQ (LIVER/GB)  Result Date: 01/06/2022 CLINICAL DATA:  RIGHT upper quadrant pain EXAM: ULTRASOUND ABDOMEN LIMITED RIGHT UPPER QUADRANT COMPARISON:  CT chest abdomen pelvis 04/02/2021 FINDINGS: Gallbladder: Normally distended without stones or wall thickening. No pericholecystic fluid or sonographic Murphy sign. Common bile duct: Diameter: 3 mm, normal Liver: Normal echogenicity without mass or nodularity. No intrahepatic biliary dilatation. Portal vein is patent on color Doppler imaging with normal direction of blood flow towards the liver. Other: N/A IMPRESSION: Normal exam. Electronically Signed   By: Lavonia Dana M.D.   On: 01/06/2022 14:43       PROCEDURES:  Critical Care performed: No  Procedures   MEDICATIONS ORDERED IN ED: Medications  ondansetron (ZOFRAN) injection 4 mg (has no administration in time range)  acetaminophen (TYLENOL) tablet 650 mg (has no administration in time range)  hydrALAZINE (APRESOLINE) injection 5 mg (has no administration in time range)  cefTRIAXone (ROCEPHIN) 1 g in sodium chloride 0.9 % 100 mL IVPB (has no administration in time range)  enoxaparin (LOVENOX) injection 47.5 mg (has no  administration in time range)  cefTRIAXone (ROCEPHIN) 1 g in sodium chloride 0.9 % 100 mL IVPB (1 g Intravenous New Bag/Given 01/06/22 1507)     IMPRESSION / MDM / ASSESSMENT AND PLAN / ED COURSE  I reviewed the triage vital signs and the nursing notes.  Differential diagnosis includes, but is not limited to, generalized fatigue, deconditioning, dehydration and anemia infection etc.  No focal neurologic symptoms by examination.  No sensory or motor deficits but appears fatigued especially with regard to lower extremities.  Labs reviewed and remarkable for fairly normal metabolic panel except for slightly reduced calcium.  CBC reviewed hemoglobin 9 which is actually improved from previous admission does not appear to have an acute drop in hemoglobin.  Known anemia.  No neurologic symptoms that would be suggestive of central neurologic etiology.  Alert well oriented without focal deficits.  Urinalysis concerning for possible UTI.  On exam he seems to have a protuberant umbilicus, and slightly protuberant abdomen, wish to exclude hepatobiliary etiology or free fluid.  No associate abdominal pain.  Will obtain abdominal ultrasound  Patient's presentation is most consistent with acute illness / injury with system symptoms.  The patient is on the cardiac monitor to evaluate for evidence of arrhythmia and/or significant heart rate changes.    Labs interpreted as concern for urinary tract infection.  Anemia, known.  ----------------------------------------- 3:43 PM on 01/06/2022 ----------------------------------------- Reviewed prior urine culture.  Rocephin ordered.  Discussed with patient he is understanding agreeable with plan for admission as he now has ambulatory dysfunction with apparent urinary tract infection, likely recurrent in nature.  Patient alert well oriented, requesting to be able to eat and drink.  He appears to fatigued though and given his associated  weakness with UTI will require admission.  Consult discussed with and admission decision made with conjunction of hospitalist Dr. Blaine Hamper  FINAL CLINICAL IMPRESSION(S) / ED DIAGNOSES   Final diagnoses:  Weakness  Lower urinary tract infectious disease  Ambulatory dysfunction     Rx / DC Orders   ED Discharge Orders     None        Note:  This document was prepared using Dragon voice recognition software and may include unintentional dictation errors.   Delman Kitten, MD 01/06/22 1544

## 2022-01-06 NOTE — Assessment & Plan Note (Signed)
Patient has metastasized prostate cancer.  S/p of radiation therapy.  Patient is following up with Dr. Caprice Beaver of urology/ -Continue Flomax

## 2022-01-06 NOTE — Assessment & Plan Note (Signed)
CPAP.  

## 2022-01-06 NOTE — Assessment & Plan Note (Signed)
Likely due to UTI.  No focal neurodeficit on physical examination. -PT/OT

## 2022-01-06 NOTE — Assessment & Plan Note (Signed)
Baseline creatinine 1.1, his creatinine is 1.23, BUN 26, close to baseline. -Follow-up with BMP

## 2022-01-06 NOTE — ED Triage Notes (Signed)
Arrives via [facility] for c/o generalized weakness.  Scheduled for a blood transfusion this week for low hgb, but appointment was cancelled.  VS wnl.  CBG wnl.  AAOx3.

## 2022-01-06 NOTE — Telephone Encounter (Signed)
Patient's spouse called and stated that patient is extremely weak and cannot get out of bed. She has requested a call from the clinical team.

## 2022-01-06 NOTE — Telephone Encounter (Signed)
Called house phone and no answer and tried pt's cell and no answer. I looked in the computer and pt is in hospital. I called wife and she states he had to go to hospital, he could not get out of bed himself.

## 2022-01-06 NOTE — Assessment & Plan Note (Addendum)
--  BP measurement have varied widely --cont home losartan and Imdur

## 2022-01-06 NOTE — H&P (Addendum)
History and Physical    Cesar Powell CHY:[id number] DOB: 03/27/1931 DOA: 01/06/2022  Referring MD/NP/PA:   PCP: Owens Loffler, MD   Patient coming from:  The patient is coming from home.  At baseline, pt is independent for most of ADL.        Chief Complaint: Generalized weakness, dysuria  HPI: Cesar Powell is a 88 y.o. male with medical history significant of Hypertension, hyperlipidemia, CAD, with DES placement, OSA on CPAP, metastasized prostate cancer (s/p of radiation therapy), anemia, CKD-3B, who presents with weakness and dysuria.  Patient states that he has generalized weakness for more than 3 days, which has  been progressively worsening.  He does not have unilateral numbness or tinglings in extremities.  No facial droop or slurred speech.  No recent fall.  He states that he does self cath.  He has dysuria, burning on urination and difficulty urinating.  Denies chest pain, cough, shortness breath. No symptoms of UTI.  Patient has mildly distended abdomen, but denies any abdominal pain, nausea, vomiting or diarrhea.  Data Reviewed and ED Course: pt was found to have WBC 6.4, positive urinalysis (turbid appearance, moderate amount of leukocyte, many bacteria, WBC> 50), renal function close to baseline, temperature normal, blood pressure 155/57, heart rate 83, RR 17, oxygen saturation 98% on room air.  Chest x-ray negative.  Abdominal ultrasound is negative.    EKG: I have personally reviewed.  Sinus rhythm, QTc 386, Q wave in lead III/aVF, low voltage.   Review of Systems:   General: no fevers, chills, no body weight gain, has fatigue HEENT: no blurry vision, hearing changes or sore throat Respiratory: no dyspnea, coughing, wheezing CV: no chest pain, no palpitations GI: no nausea, vomiting, abdominal pain, diarrhea, constipation.  Has abdominal distention GU: has dysuria, burning on urination, no increased urinary frequency, hematuria  Ext: no leg edema Neuro: no  unilateral weakness, numbness, or tingling, no vision change or hearing loss Skin: no rash, no skin tear. MSK: No muscle spasm, no deformity, no limitation of range of movement in spin Heme: No easy bruising.  Travel history: No recent long distant travel.   Allergy:  Allergies  Allergen Reactions   Gluten Meal Rash    Past Medical History:  Diagnosis Date   Coronary artery disease involving native coronary artery of native heart 08/25/2021   H/O prostate cancer    was treated with radiation   Hypertension    Local recurrence of prostate cancer ([place]) 06/21/2004   Qualifier: Diagnosis of  By: Fuller Plan CMA (AAMA), [place]     Myocardial infarction ([place]) 2022   Obstructive sleep apnea 06/22/2007   NPSG [place] 1979 Unattended Home sleep Study- 05/10/14- Confirms severe OSA, AHI 41.8/ hr, weight 230 pounds CPAP and also Transcend portable CPAP auto 8-15     Sleep apnea 1977   uses C-pap     Past Surgical History:  Procedure Laterality Date   ABDOMINAL SURGERY     ruptured intestines    CATARACT EXTRACTION W/PHACO Left 01/29/2016   Procedure: CATARACT EXTRACTION PHACO AND INTRAOCULAR LENS PLACEMENT ([place]) left eye;  Surgeon: Leandrew Koyanagi, MD;  Location: [facility];  Service: Ophthalmology;  Laterality: Left;  RESTOR LENS   CATARACT EXTRACTION W/PHACO Right 10/21/2016   Procedure: CATARACT EXTRACTION PHACO AND INTRAOCULAR LENS PLACEMENT ([facility])  Right restor toric lens;  Surgeon: Leandrew Koyanagi, MD;  Location: [facility];  Service: Ophthalmology;  Laterality: Right;  restor toric lens   CORONARY/GRAFT ACUTE MI  REVASCULARIZATION N/A 05/13/2021   Procedure: Coronary/Graft Acute MI Revascularization;  Surgeon: Nelva Bush, MD;  Location: [facility];  Service: Cardiovascular;  Laterality: N/A;   CYSTOSCOPY WITH LITHOLAPAXY N/A 08/14/2016   Procedure: CYSTOSCOPY WITH LITHOLAPAXY;  Surgeon: Nickie Retort, MD;  Location: [facility];  Service: Urology;   Laterality: N/A;   FEMUR IM NAIL Right 10/08/2014   Procedure: INTRAMEDULLARY (IM) NAIL FEMORAL;  Surgeon: Rod Can, MD;  Location: [facility];  Service: Orthopedics;  Laterality: Right;  PER DANIELLE 110 MIN   HOLMIUM LASER APPLICATION  0/99/8338   Procedure: HOLMIUM LASER APPLICATION;  Surgeon: Nickie Retort, MD;  Location: [facility];  Service: Urology;;   LEFT HEART CATH AND CORONARY ANGIOGRAPHY N/A 05/13/2021   Procedure: LEFT HEART CATH AND CORONARY ANGIOGRAPHY;  Surgeon: Nelva Bush, MD;  Location: [facility];  Service: Cardiovascular;  Laterality: N/A;   OTHER SURGICAL HISTORY  2006   Prostate Radiation Surgery    RADIOACTIVE SEED IMPLANT N/A 10/30/2019   Procedure: RADIOACTIVE SEED IMPLANT/BRACHYTHERAPY IMPLANT;  Surgeon: Billey Co, MD;  Location: [facility];  Service: Urology;  Laterality: N/A;  41 seeds implanted    Social History:  reports that he quit smoking about 56 years ago. His smoking use included cigarettes. He has a 15.00 pack-year smoking history. He has been exposed to tobacco smoke. He has never used smokeless tobacco. He reports current alcohol use of about 7.0 standard drinks per week. He reports that he does not use drugs.  Family History:  Family History  Problem Relation Age of Onset   Heart disease Brother    Heart attack Brother    Lung cancer Brother    Prostate cancer Neg Hx    Bladder Cancer Neg Hx    Kidney cancer Neg Hx      Prior to Admission medications   Medication Sig Start Date End Date Taking? Authorizing Provider  aspirin 81 MG chewable tablet Chew 1 tablet (81 mg total) by mouth daily. 05/16/21 05/16/22  Jennye Boroughs, MD  atorvastatin (LIPITOR) 80 MG tablet Take 1 tablet (80 mg total) by mouth daily. 08/25/21   Minna Merritts, MD  Cholecalciferol (VITAMIN D3) 50 MCG (2000 UT) TABS Take 4,000 Units by mouth daily.    [provider]  isosorbide mononitrate (IMDUR) 30 MG 24 hr tablet Take 15 mg by mouth daily.  11/14/21   [provider]  losartan (COZAAR) 25 MG tablet Take 0.5 tablets (12.5 mg total) by mouth daily. 08/25/21   Minna Merritts, MD  nitroGLYCERIN (NITROSTAT) 0.4 MG SL tablet Place 0.4 mg under the tongue every 5 (five) minutes as needed for chest pain.    [provider]  pantoprazole (PROTONIX) 40 MG tablet Take 1 tablet (40 mg total) by mouth 2 (two) times daily before a meal. 12/13/21 01/12/22  Amin, Jeanella Flattery, MD  tamsulosin (FLOMAX) 0.4 MG CAPS capsule Take 1 capsule (0.4 mg total) by mouth daily after supper. 04/16/21   Bonnielee Haff, MD  ticagrelor (BRILINTA) 90 MG TABS tablet Take 1 tablet (90 mg total) by mouth 2 (two) times daily. 08/21/21   Antony Madura, PA-C    Physical Exam: Vitals:   01/06/22 2505 01/06/22 1119 01/06/22 1646 01/06/22 1735  BP: (!) 147/63 (!) 155/57 (!) 170/68 (!) 177/67  Pulse: 83 66 68 92  Resp: '17 14 16 16  '$ Temp: 98.3 F (36.8 C)   (!) 97.5 F (36.4 C)  TempSrc: Oral  Oral  SpO2: 100% 98% 99% 91%  Weight: 93.4 kg     Height: '5\' 7"'$  (1.702 m)      General: Not in acute distress HEENT:       Eyes: PERRL, EOMI, no scleral icterus.       ENT: No discharge from the ears and nose, no pharynx injection, no tonsillar enlargement.        Neck: No JVD, no bruit, no mass felt. Heme: No neck lymph node enlargement. Cardiac: S1/S2, RRR, No murmurs, No gallops or rubs. Respiratory: No rales, wheezing, rhonchi or rubs. GI: Soft, mildly distended, nontender, no rebound pain, no organomegaly, BS present. GU: No hematuria Ext: No pitting leg edema bilaterally.  Has venous insufficiency change in both legs.  1+DP/PT pulse bilaterally. Musculoskeletal: No joint deformities, No joint redness or warmth, no limitation of ROM in spin. Skin: No rashes.  Neuro: Alert, oriented X3, cranial nerves II-XII grossly intact.  Psych: Patient is not psychotic, no suicidal or hemocidal ideation.  Labs on Admission: I have personally reviewed  following labs and imaging studies  CBC: Recent Labs  Lab 12/31/21 0931 01/06/22 0945  WBC 7.3 6.4  NEUTROABS 6.2  --   HGB 9.5* 9.0*  HCT 30.2* 29.1*  MCV 91.2 89.0  PLT 287 299   Basic Metabolic Panel: Recent Labs  Lab 01/06/22 0945  NA 135  K 4.2  CL 105  CO2 26  GLUCOSE 108*  BUN 26*  CREATININE 1.23  CALCIUM 8.7*   GFR: Estimated Creatinine Clearance: 44.3 mL/min (by C-G formula based on SCr of 1.23 mg/dL). Liver Function Tests: No results for input(s): AST, ALT, ALKPHOS, BILITOT, PROT, ALBUMIN in the last 168 hours. No results for input(s): LIPASE, AMYLASE in the last 168 hours. No results for input(s): AMMONIA in the last 168 hours. Coagulation Profile: No results for input(s): INR, PROTIME in the last 168 hours. Cardiac Enzymes: No results for input(s): CKTOTAL, CKMB, CKMBINDEX, TROPONINI in the last 168 hours. BNP (last 3 results) No results for input(s): PROBNP in the last 8760 hours. HbA1C: No results for input(s): HGBA1C in the last 72 hours. CBG: No results for input(s): GLUCAP in the last 168 hours. Lipid Profile: No results for input(s): CHOL, HDL, LDLCALC, TRIG, CHOLHDL, LDLDIRECT in the last 72 hours. Thyroid Function Tests: No results for input(s): TSH, T4TOTAL, FREET4, T3FREE, THYROIDAB in the last 72 hours. Anemia Panel: No results for input(s): VITAMINB12, FOLATE, FERRITIN, TIBC, IRON, RETICCTPCT in the last 72 hours. Urine analysis:    Component Value Date/Time   COLORURINE YELLOW (A) 01/06/2022 1312   APPEARANCEUR TURBID (A) 01/06/2022 1312   APPEARANCEUR Cloudy (A) 08/13/2021 1047   LABSPEC 1.017 01/06/2022 1312   PHURINE 7.0 01/06/2022 1312   GLUCOSEU NEGATIVE 01/06/2022 1312   HGBUR SMALL (A) 01/06/2022 1312   HGBUR negative 06/13/2007 1105   BILIRUBINUR NEGATIVE 01/06/2022 1312   BILIRUBINUR Negative 08/13/2021 1047   KETONESUR NEGATIVE 01/06/2022 1312   PROTEINUR 100 (A) 01/06/2022 1312   UROBILINOGEN 0.2 06/01/2016 0953    UROBILINOGEN negative 06/13/2007 1105   NITRITE NEGATIVE 01/06/2022 1312   LEUKOCYTESUR MODERATE (A) 01/06/2022 1312   Sepsis Labs: '@LABRCNTIP'$ (procalcitonin:4,lacticidven:4) )No results found for this or any previous visit (from the past 240 hour(s)).   Radiological Exams on Admission: DG Chest 2 View  Result Date: 01/06/2022 CLINICAL DATA:  Weakness. EXAM: CHEST - 2 VIEW COMPARISON:  12/12/2021 FINDINGS: Cardiac enlargement. Aortic atherosclerotic calcifications. No signs of pleural effusion or edema. The lungs are  hypoinflated. No airspace opacities. Visualized osseous structures appear intact. IMPRESSION: No active cardiopulmonary abnormalities. Electronically Signed   By: Kerby Moors M.D.   On: 01/06/2022 12:56   [place] ASCITES (ABDOMEN LIMITED)  Result Date: 01/06/2022 CLINICAL DATA:  Abdominal distension question ascites EXAM: LIMITED ABDOMEN ULTRASOUND FOR ASCITES TECHNIQUE: Limited ultrasound survey for ascites was performed in all four abdominal quadrants. COMPARISON:  CT chest abdomen pelvis 04/02/2021 FINDINGS: Survey imaging of the 4 quadrants was performed. No ascites identified. IMPRESSION: No ascites. Electronically Signed   By: Lavonia Dana M.D.   On: 01/06/2022 14:44   US Abdomen Limited RUQ (LIVER/GB)  Result Date: 01/06/2022 CLINICAL DATA:  RIGHT upper quadrant pain EXAM: ULTRASOUND ABDOMEN LIMITED RIGHT UPPER QUADRANT COMPARISON:  CT chest abdomen pelvis 04/02/2021 FINDINGS: Gallbladder: Normally distended without stones or wall thickening. No pericholecystic fluid or sonographic Murphy sign. Common bile duct: Diameter: 3 mm, normal Liver: Normal echogenicity without mass or nodularity. No intrahepatic biliary dilatation. Portal vein is patent on color Doppler imaging with normal direction of blood flow towards the liver. Other: N/A IMPRESSION: Normal exam. Electronically Signed   By: Lavonia Dana M.D.   On: 01/06/2022 14:43      Assessment/Plan Principal Problem:   UTI  (urinary tract infection) Active Problems:   Coronary artery disease involving native coronary artery of native heart   Essential hypertension   Chronic kidney disease, stage 3b (HCC)   Prostate cancer (HCC)   Obstructive sleep apnea   Normocytic anemia   Generalized weakness    Assessment and Plan: * UTI (urinary tract infection) Pt does not have fever and leukocytosis.  Not septic -will place in med-surg bed for obs -rocephin IV -F/u urine culture    Coronary artery disease involving native coronary artery of native heart S/p of DES. -on ASA -Brilinta -lipitor, Imdur    Essential hypertension - IV hydralazine as needed -Cozaar  Chronic kidney disease, stage 3b (HCC) Baseline creatinine 1.1, his creatinine is 1.23, BUN 26, close to baseline. -Follow-up with BMP  Prostate cancer [facility]) Patient has metastasized prostate cancer.  S/p of radiation therapy.  Patient is following up with Dr. Caprice Beaver of urology/ -Continue Flomax  Obstructive sleep apnea - CPAP  Normocytic anemia Baseline hemoglobin 8-9.  His hemoglobin is 9.0, at baseline -Follow-up with CBC  Generalized weakness Likely due to UTI.  No focal neurodeficit on physical examination. -PT/OT             DVT ppx: SQ Lovenox  Code Status: Partial code (I discussed with the patient and explained the meaning of CODE STATUS, patient wants to be partial code, OK for CPR, but no intubation).  Family Communication:  Yes, patient's daughter by phone  Disposition Plan:  Anticipate discharge back to previous environment  Consults called:  none  Admission status and Level of care: Med-Surg:    for obs     Severity of Illness:  The appropriate patient status for this patient is OBSERVATION. Observation status is judged to be reasonable and necessary in order to provide the required intensity of service to ensure the patient's safety. The patient's presenting symptoms, physical exam findings, and  initial radiographic and laboratory data in the context of their medical condition is felt to place them at decreased risk for further clinical deterioration. Furthermore, it is anticipated that the patient will be medically stable for discharge from the hospital within 2 midnights of admission.        Date of Service 01/06/2022  Ivor [facility]   If 7PM-7AM, please contact night-coverage [email or web address] 01/06/2022, 7:03 PM;hpinew

## 2022-01-06 NOTE — Assessment & Plan Note (Addendum)
Pt does not have fever and leukocytosis.  Not septic.  No urinary symptoms, but pt said his UTI symptoms were weakness. --urine cx grew multiple species Plan: --cont ceftriaxone for empiric tx

## 2022-01-06 NOTE — Plan of Care (Signed)

## 2022-01-07 DIAGNOSIS — Z923 Personal history of irradiation: Secondary | ICD-10-CM | POA: Diagnosis not present

## 2022-01-07 DIAGNOSIS — Z955 Presence of coronary angioplasty implant and graft: Secondary | ICD-10-CM | POA: Diagnosis not present

## 2022-01-07 DIAGNOSIS — I129 Hypertensive chronic kidney disease with stage 1 through stage 4 chronic kidney disease, or unspecified chronic kidney disease: Secondary | ICD-10-CM | POA: Diagnosis present

## 2022-01-07 DIAGNOSIS — E785 Hyperlipidemia, unspecified: Secondary | ICD-10-CM | POA: Diagnosis present

## 2022-01-07 DIAGNOSIS — N1832 Chronic kidney disease, stage 3b: Secondary | ICD-10-CM | POA: Diagnosis present

## 2022-01-07 DIAGNOSIS — Z8546 Personal history of malignant neoplasm of prostate: Secondary | ICD-10-CM | POA: Diagnosis not present

## 2022-01-07 DIAGNOSIS — R269 Unspecified abnormalities of gait and mobility: Secondary | ICD-10-CM | POA: Diagnosis present

## 2022-01-07 DIAGNOSIS — Z87891 Personal history of nicotine dependence: Secondary | ICD-10-CM | POA: Diagnosis not present

## 2022-01-07 DIAGNOSIS — Z8249 Family history of ischemic heart disease and other diseases of the circulatory system: Secondary | ICD-10-CM | POA: Diagnosis not present

## 2022-01-07 DIAGNOSIS — G4733 Obstructive sleep apnea (adult) (pediatric): Secondary | ICD-10-CM | POA: Diagnosis present

## 2022-01-07 DIAGNOSIS — Z79899 Other long term (current) drug therapy: Secondary | ICD-10-CM | POA: Diagnosis not present

## 2022-01-07 DIAGNOSIS — Z91018 Allergy to other foods: Secondary | ICD-10-CM | POA: Diagnosis not present

## 2022-01-07 DIAGNOSIS — Z7902 Long term (current) use of antithrombotics/antiplatelets: Secondary | ICD-10-CM | POA: Diagnosis not present

## 2022-01-07 DIAGNOSIS — Z7982 Long term (current) use of aspirin: Secondary | ICD-10-CM | POA: Diagnosis not present

## 2022-01-07 DIAGNOSIS — N390 Urinary tract infection, site not specified: Secondary | ICD-10-CM | POA: Diagnosis present

## 2022-01-07 DIAGNOSIS — D631 Anemia in chronic kidney disease: Secondary | ICD-10-CM | POA: Diagnosis present

## 2022-01-07 DIAGNOSIS — I2510 Atherosclerotic heart disease of native coronary artery without angina pectoris: Secondary | ICD-10-CM | POA: Diagnosis present

## 2022-01-07 DIAGNOSIS — E669 Obesity, unspecified: Secondary | ICD-10-CM | POA: Diagnosis present

## 2022-01-07 DIAGNOSIS — N3000 Acute cystitis without hematuria: Secondary | ICD-10-CM | POA: Diagnosis not present

## 2022-01-07 DIAGNOSIS — Z6832 Body mass index (BMI) 32.0-32.9, adult: Secondary | ICD-10-CM | POA: Diagnosis not present

## 2022-01-07 DIAGNOSIS — I252 Old myocardial infarction: Secondary | ICD-10-CM | POA: Diagnosis not present

## 2022-01-07 LAB — BASIC METABOLIC PANEL
Anion gap: 6 (ref 5–15)
BUN: 24 mg/dL — ABNORMAL HIGH (ref 8–23)
CO2: 24 mmol/L (ref 22–32)
Calcium: 8.6 mg/dL — ABNORMAL LOW (ref 8.9–10.3)
Chloride: 105 mmol/L (ref 98–111)
Creatinine, Ser: 0.86 mg/dL (ref 0.61–1.24)
GFR, Estimated: >60 mL/min (ref >60)
Glucose, Bld: 114 mg/dL — ABNORMAL HIGH (ref 70–99)
Potassium: 4.2 mmol/L (ref 3.5–5.1)
Sodium: 135 mmol/L (ref 135–145)

## 2022-01-07 LAB — CBC
HCT: 28 % — ABNORMAL LOW (ref 39.0–52.0)
Hemoglobin: 8.8 g/dL — ABNORMAL LOW (ref 13.0–17.0)
MCH: 28 pg (ref 26.0–34.0)
MCHC: 31.4 g/dL (ref 30.0–36.0)
MCV: 89.2 fL (ref 80.0–100.0)
Platelets: 290 10*3/uL (ref 150–400)
RBC: 3.14 MIL/uL — ABNORMAL LOW (ref 4.22–5.81)
RDW: 15.9 % — ABNORMAL HIGH (ref 11.5–15.5)
WBC: 6.2 10*3/uL (ref 4.0–10.5)
nRBC: 0 % (ref 0.0–0.2)

## 2022-01-07 MED ORDER — LOSARTAN POTASSIUM 25 MG PO TABS: 12.5000 mg | ORAL_TABLET | Freq: Every day | ORAL | Status: DC

## 2022-01-07 MED ORDER — LOSARTAN POTASSIUM 25 MG PO TABS
25.0000 mg | ORAL_TABLET | Freq: Every day | ORAL | Status: DC
  Filled 2022-01-07: qty 1

## 2022-01-07 NOTE — Progress Notes (Signed)
  Progress Note   Patient: Cesar Powell AXK:[id number] DOB: August 21, 1930 DOA: 01/06/2022     0 DOS: the patient was seen and examined on 01/07/2022   Brief hospital course: No notes on file  Assessment and Plan: * UTI (urinary tract infection) Pt does not have fever and leukocytosis.  Not septic.  No urinary symptoms, but pt said his UTI symptoms were weakness. Plan: --cont ceftriaxone pending urine cx    Coronary artery disease involving native coronary artery of native heart S/p of DES. -on ASA -Brilinta -lipitor, Imdur    Essential hypertension --BP measurement have varied widely --cont home losartan and Imdur  Chronic kidney disease, stage 3b (HCC) Baseline creatinine 1.1, his creatinine is 1.23, BUN 26, close to baseline. -Follow-up with BMP  Prostate cancer [facility]) Patient has metastasized prostate cancer.  S/p of radiation therapy.  Patient is following up with Dr. Caprice Beaver of urology/ -Continue Flomax  Obstructive sleep apnea - CPAP  Normocytic anemia Baseline hemoglobin 8-9.  His hemoglobin is 9.0, at baseline -Follow-up with CBC  Generalized weakness Likely due to UTI.  No focal neurodeficit on physical examination. -PT/OT        Subjective:  No dysuria.  Weakness improved.   Physical Exam:  Constitutional: NAD, AAOx3, sitting in recliner HEENT: conjunctivae and lids normal, EOMI CV: No cyanosis.   RESP: normal respiratory effort, on RA Extremities: No effusions, edema in BLE Neuro: II - XII grossly intact.   Psych: Normal mood and affect.     Data Reviewed:  Family Communication:   Disposition: Status is: Inpatient   Planned Discharge Destination: Home    Time spent: 50 minutes  Author: Enzo Bi, MD 01/07/2022 7:30 PM  For on call review [email or web address].

## 2022-01-07 NOTE — Progress Notes (Signed)
Nutrition Brief Note   Received call from dietary services. Pt is upset that he is unable to order certain foods and reports that his diet is too restricted. Pt currently on heart healthy diet with noted gluten allergy. Spoke with pt via phone. Pt reports that he has a "slight" gluten sensitivity but he does eat cereals and bread at home with no issues. Explained to pt that RD can delete gluten allergy from his chart, but then he will get sent items with gluten. Pt is aware of what foods he can and can't have. Pt requests to have a regular diet with no gluten restrictions so that he can choose what foods he wants to eat. RD will make these changes in Epic per pt's request.   Koleen Distance MS, RD, LDN Please refer to [facility] for RD and/or RD on-call/weekend/after hours pager

## 2022-01-07 NOTE — Progress Notes (Signed)
Physical Therapy Evaluation Patient Details Name: Cesar Powell MRN: [id number] DOB: [birth year] Today's Date: 01/07/2022  History of Present Illness  Pt is an 88 y.o. male who presented 12/12/21 s/p syncopal episode due to symptomatic anemia. PMH: CAD s/p PCI/DES to pRCA (05/13/2021), castrate resistant metastatic prostate cancer, HTN, HLD, CKD stage IIIb, normocytic anemia, atonic bladder, and OSA on CPAP. Pt lives in [place] with wife.   Clinical Impression  Pt is a pleasant 88 year old male who was admitted for generalized weakness. Pt performs bed mobility, transfers, and ambulation with with Supervision to Min A +1. Pt reported L LE pain of 4/10 during session, especially when lifting L LE with supine SLR and during swing phase of ambulation. Pt ambulation limited to 15 ft today due to pt request to use the bathroom. Pt demonstrates deficits with strength, endurance, gait speed, and pain in L LE. Would benefit from skilled PT to address above deficits and promote optimal return to PLOF. Pt was left in bathroom with CNA hand-off for bathing. Currently recommend [facility] PT with intermittent supervision at home with family (wife) support. This entire session was guided, instructed, and directly supervised by Greggory Stallion, PT, DPT, GCS.   Recommendations for follow up therapy are one component of a multi-disciplinary discharge planning process, led by the attending physician.  Recommendations may be updated based on patient status, additional functional criteria and insurance authorization.  Follow Up Recommendations Home health PT    Assistance Recommended at Discharge Intermittent Supervision/Assistance  Patient can return home with the following  A little help with walking and/or transfers;A little help with bathing/dressing/bathroom;Assistance with cooking/housework;Help with stairs or ramp for entrance;Assist for transportation    Equipment Recommendations    Recommendations for Other  Services       Functional Status Assessment Patient has had a recent decline in their functional status and demonstrates the ability to make significant improvements in function in a reasonable and predictable amount of time.     Precautions / Restrictions Precautions Precautions: Fall Restrictions Weight Bearing Restrictions: No      Mobility  Bed Mobility Overal bed mobility: Modified Independent             General bed mobility comments: Increased time needed for bed mobility tasks. Use of bilat UEs to pull from bed rail. (Pt reported moving better than at baseline.)    Transfers Overall transfer level: Needs assistance Equipment used: Rolling walker (2 wheels) Transfers: Sit to/from Stand, Bed to chair/wheelchair/BSC Sit to Stand: Min guard   Step pivot transfers: Min assist       General transfer comment: Sit-to-stand from bed with 1 UE pushing from bed and 1 UE on RW. Pt preferred to pull with bilat UEs from RW, but educated on proper sit-to-stand technique. Pt min A for toilet transfer with verbal cues for hand placement.    Ambulation/Gait Ambulation/Gait assistance: Min guard, Supervision Gait Distance (Feet): 15 Feet Assistive device: Rolling walker (2 wheels) Gait Pattern/deviations: Decreased step length - right, Decreased step length - left, Decreased stride length, Step-through pattern (Pain in L LE swing phase; no pain during stance phase)     Pre-gait activities: Standing marching in place x5 ea prior to ambulating. (Pt monitored for dizziness due to low hemoglobin. Pt reported no dizziness.) General Gait Details: Ambulation distance limited due to patient requesting to use the bathroom. Pt reported doing better with mobility tasks today than at baseline.  Stairs  Wheelchair Mobility    Modified Rankin (Stroke Patients Only)       Balance Overall balance assessment: Modified Independent, Needs assistance Sitting-balance  support: Bilateral upper extremity supported, Feet supported Sitting balance-Leahy Scale: Fair Sitting balance - Comments: Bilat UE support in sitting, esp with leaning tasks and donning shoes (slip-on method used)   Standing balance support: Bilateral upper extremity supported, Reliant on assistive device for balance Standing balance-Leahy Scale: Fair Standing balance comment: RW used for balance in standing.                             Pertinent Vitals/Pain Pain Assessment Pain Assessment: 0-10 Pain Score: 4  Pain Location: L LE (upper thigh) Pain Descriptors / Indicators: Discomfort, Other (Comment) (d/t recent fall) Pain Intervention(s): Limited activity within patient's tolerance, Monitored during session, Repositioned    Home Living Family/patient expects to be discharged to:: Private residence Living Arrangements: Spouse/significant other Available Help at Discharge: Family Type of Home: House Home Access: Stairs to enter Entrance Stairs-Rails: Left (ascending) Entrance Stairs-Number of Steps: 3 (Enter through the garage)   Home Layout: Two level;Full bath on main level;Able to live on main level with bedroom/bathroom Home Equipment: Shower seat - built in;Grab bars - toilet;Grab bars - tub/shower;Cane - single point;Cane - quad;Rollator (4 wheels) Additional Comments: Pt reports primarily entering home from garage where there are 3 steps to enter. Has 6 steps to enter at the front door.    Prior Function Prior Level of Function : Independent/Modified Independent;History of Falls (last six months)             Mobility Comments: Pt reported using Rollator in the home and SPC (sometimes bilat SPCs) outside of the home. ADLs Comments: Pt reports independent with bathing, dressing, feeding.     Hand Dominance   Dominant Hand: Right    Extremity/Trunk Assessment        Lower Extremity Assessment Lower Extremity Assessment: Generalized weakness (Grossly  4/5 bilat LE, except L Hip MMT Flex 3+/5)       Communication   Communication: HOH  Cognition Arousal/Alertness: Awake/alert Behavior During Therapy: WFL for tasks assessed/performed Overall Cognitive Status: Within Functional Limits for tasks assessed                                 General Comments: A&Ox4; Able to follow commands, hold conversation while ambulating.        General Comments      Exercises Other Exercises Other Exercises: SLR x3 ea, LE MMT screen sitting EOB, Standing Marching x5 ea Other Exercises: Pt ambulated to bathroom to void and transferred to toilet with BSC used for elevated seat height and bilat arm rests.   Assessment/Plan    PT Assessment Patient needs continued PT services  PT Problem List Decreased strength;Pain;Decreased balance;Decreased mobility       PT Treatment Interventions DME instruction;Gait training;Stair training;Therapeutic activities;Therapeutic exercise;Patient/family education;Functional mobility training    PT Goals (Current goals can be found in the Care Plan section)  Acute Rehab PT Goals Patient Stated Goal: To go home. PT Goal Formulation: With patient Time For Goal Achievement: 01/21/22 Potential to Achieve Goals: Good    Frequency Min 2X/week     Co-evaluation               AM-PAC PT "6 Clicks" Mobility  Outcome Measure Help needed turning from your  back to your side while in a flat bed without using bedrails?: None Help needed moving from lying on your back to sitting on the side of a flat bed without using bedrails?: None Help needed moving to and from a bed to a chair (including a wheelchair)?: A Little Help needed standing up from a chair using your arms (e.g., wheelchair or bedside chair)?: None Help needed to walk in hospital room?: A Little Help needed climbing 3-5 steps with a railing? : A Lot 6 Click Score: 20    End of Session Equipment Utilized During Treatment: Gait  belt Activity Tolerance: Patient tolerated treatment well Patient left: with nursing/sitter in room;Other (comment) (in bathroom left with CNA) Nurse Communication: Mobility status PT Visit Diagnosis: Muscle weakness (generalized) (M62.81);Unsteadiness on feet (R26.81);History of falling (Z91.81)    Time: 6301-6010 PT Time Calculation (min) (ACUTE ONLY): 27 min   Charges:              Rella Larve, SPT   Skarlett Sedlacek 01/07/2022, 10:57 AM

## 2022-01-07 NOTE — TOC Initial Note (Signed)
Transition of Care [facility]) - Initial/Assessment Note    Patient Details  Name: Cesar Powell MRN: [id number] Date of Birth: 1930-08-10  Transition of Care [facility]) CM/SW Contact:    Candie Chroman, LCSW Phone Number: 01/07/2022, 4:14 PM  Clinical Narrative:   CSW met with patient. No supports at bedside. CSW introduced role and explained that therapy recommendations would be discussed. Patient is agreeable to home health. He was set up with [facility] in mid-May but they have been unable to start services. Patient is agreeable to try again with this agency. No further concerns. CSW encouraged patient to contact CSW as needed. CSW will continue to follow patient for support and facilitate return home when stable.               Expected Discharge Plan: [place] Barriers to Discharge: Continued Medical Work up   Patient Goals and CMS Choice     Choice offered to / list presented to : Patient  Expected Discharge Plan and Services Expected Discharge Plan: [place] Acute Care Choice: [place] arrangements for the past 2 months: [place]: [place] Date [place]: 01/07/22   Representative spoke with at [place]: Sharmon Revere  Prior Living Arrangements/Services Living arrangements for the past 2 months: [place] Lives with:: Spouse Patient language and need for interpreter reviewed:: Yes Do you feel safe going back to the place where you live?: Yes      Need for Family Participation in Patient Care: Yes (Comment) Care giver support system in place?: Yes (comment)   Criminal Activity/Legal Involvement Pertinent to Current Situation/Hospitalization: No - Comment as needed  Activities of Daily Living Home Assistive Devices/Equipment: Gilford Rile (specify type) ADL Screening (condition at time of admission) Patient's cognitive ability  adequate to safely complete daily activities?: Yes Is the patient deaf or have difficulty hearing?: Yes Does the patient have difficulty seeing, even when wearing glasses/contacts?: No Does the patient have difficulty concentrating, remembering, or making decisions?: No Patient able to express need for assistance with ADLs?: Yes Does the patient have difficulty dressing or bathing?: No Independently performs ADLs?: Yes (appropriate for developmental age) Does the patient have difficulty walking or climbing stairs?: No Weakness of Legs: Both Weakness of Arms/Hands: None  Permission Sought/Granted Permission sought to share information with : Facility Art therapist granted to share information with : Yes, Verbal Permission Granted     Permission granted to share info w AGENCY: [facility]        Emotional Assessment Appearance:: Appears stated age Attitude/Demeanor/Rapport: Engaged, Gracious Affect (typically observed): Accepting, Appropriate, Calm, Pleasant Orientation: : Oriented to Self, Oriented to Place, Oriented to  Time, Oriented to Situation Alcohol / Substance Use: Not Applicable Psych Involvement: No (comment)  Admission diagnosis:  Lower urinary tract infectious disease [N39.0] Abdominal distension [R14.0] UTI (urinary tract infection) [N39.0] Weakness [R53.1] RUQ pain [R10.11] Ambulatory dysfunction [R26.2] Patient Active Problem List   Diagnosis Date Noted   UTI (urinary tract infection) 01/06/2022   Generalized weakness 01/06/2022   Symptomatic anemia 12/12/2021   Chronic kidney disease, stage 3b ([place]) 12/12/2021   Coronary artery disease involving native coronary artery of native heart 08/25/2021   Acute renal failure with tubular  necrosis ([place]) 05/23/2021   STEMI involving right coronary artery ([place]) 05/13/2021   Syncope    Normocytic anemia    Elevated brain natriuretic peptide (BNP) level    Goals of care, counseling/discussion  08/08/2019   Prostate cancer ([place]) 08/08/2019   Subtrochanteric fracture of femur ([place]) 10/08/2014   Essential hypertension 00/52/5910   METABOLIC SYNDROME X 28/90/2284   Obstructive sleep apnea 06/22/2007   Local recurrence of prostate cancer ([place]) 06/22/2007   DIVERTICULITIS, HX OF 06/22/2007   PCP:  Owens Loffler, MD Pharmacy:   [id number] [id number]: [phone]: [phone]    Social Determinants of Health (SDOH) Interventions    Readmission Risk Interventions    04/14/2021   12:55 PM  Readmission Risk Prevention Plan  Transportation Screening Complete  PCP or Specialist Appt within 5-7 Days Complete  Home Care Screening Complete  Medication Review (RN CM) Complete

## 2022-01-07 NOTE — Plan of Care (Signed)

## 2022-01-07 NOTE — Evaluation (Signed)
Occupational Therapy Evaluation Patient Details Name: Cesar Powell MRN: [id number] DOB: 06/23/1931 Today's Date: 01/07/2022   History of Present Illness PT is an 88 year old male admitted with UTI after presenting to the ED with weakness and dysuria.  PMH significant for Hypertension, hyperlipidemia, CAD, with DES placement, OSA on CPAP, metastasized prostate cancer (s/p of radiation therapy), anemia, CKD-3B   Clinical Impression   RN cleared pt for participation in OT evaluation, pt greeted in chair alert and oriented x4, agreeable to OT session. PTA pt reports MOD I with ADL, wife assists with IADL as needed. Pt reports he goes to the gym as often as possible. AT this time, presents with deficits in strength and activity tolerance affecting optimal ADL completion. CGA required for STS, amb with supervision-CGA household distances to bathroom and back. Grooming completed in standing with supervision. Increased time required for all mobility with RW. Recommend HHOT upon discharge to facilitate safe and optimal ADL completion at home.      Recommendations for follow up therapy are one component of a multi-disciplinary discharge planning process, led by the attending physician.  Recommendations may be updated based on patient status, additional functional criteria and insurance authorization.   Follow Up Recommendations  Home health OT    Assistance Recommended at Discharge Intermittent Supervision/Assistance  Patient can return home with the following A little help with walking and/or transfers;A little help with bathing/dressing/bathroom;Assistance with cooking/housework    Functional Status Assessment  Patient has had a recent decline in their functional status and demonstrates the ability to make significant improvements in function in a reasonable and predictable amount of time.  Equipment Recommendations  Other (comment) (pt has recommended equipment, recommend mwc if unable to climb  stairs)    Recommendations for Other Services       Precautions / Restrictions Precautions Precautions: Fall Restrictions Weight Bearing Restrictions: No      Mobility Bed Mobility               General bed mobility comments: nt pt in chair pre/post session    Transfers Overall transfer level: Needs assistance Equipment used: Rolling walker (2 wheels) Transfers: Sit to/from Stand Sit to Stand: Supervision, Min guard           General transfer comment: one vc for hand placement      Balance Overall balance assessment: Needs assistance Sitting-balance support: Bilateral upper extremity supported, Feet supported Sitting balance-Leahy Scale: Fair     Standing balance support: Bilateral upper extremity supported, Reliant on assistive device for balance Standing balance-Leahy Scale: Fair                             ADL either performed or assessed with clinical judgement   ADL Overall ADL's : Needs assistance/impaired     Grooming: Wash/dry hands;Standing               Lower Body Dressing: Moderate assistance Lower Body Dressing Details (indicate cue type and reason): anticipated with shoes Toilet Transfer: Min guard;Ambulation;Rolling walker (2 wheels)           Functional mobility during ADLs: Rolling walker (2 wheels);Supervision/safety;Min guard (house hold distances)       Vision Patient Visual Report: No change from baseline       Perception     Praxis      Pertinent Vitals/Pain Pain Assessment Pain Assessment: 0-10 Pain Score: 4  Pain Location: LLE Pain Intervention(s):  Limited activity within patient's tolerance, Monitored during session, Repositioned     Hand Dominance Right   Extremity/Trunk Assessment Upper Extremity Assessment Upper Extremity Assessment: Generalized weakness   Lower Extremity Assessment Lower Extremity Assessment: Generalized weakness   Cervical / Trunk Assessment Cervical / Trunk  Assessment: Normal   Communication     Cognition Arousal/Alertness: Awake/alert Behavior During Therapy: WFL for tasks assessed/performed Overall Cognitive Status: Within Functional Limits for tasks assessed- pt presents with mild processing delays, potential word finding difficulties;                                       General Comments  pt reports dizziness after intital standing, BP 119/56 in sitting, 136/78 in standing after 1 minute; spo2 >90% with mobility    Exercises Other Exercises Other Exercises: edu re: role of OT, role of rehab, discharge recommendations, home safety   Shoulder Instructions      Home Living Family/patient expects to be discharged to:: Private residence Living Arrangements: Spouse/significant other Available Help at Discharge: Family Type of Home: House Home Access: Stairs to enter [employer] of Steps: 3 Entrance Stairs-Rails: Left Home Layout: Two level;Full bath on main level;Able to live on main level with bedroom/bathroom Alternate Level Stairs-Number of Steps: 12-15 Alternate Level Stairs-Rails: Can reach both Bathroom Shower/Tub: Teacher, early years/pre: Standard     Home Equipment: Shower seat - built in;Grab bars - toilet;Grab bars - tub/shower;Cane - single point;Cane - quad;Rollator (4 wheels);BSC/3in1   Additional Comments: has bsc next to bed      Prior Functioning/Environment Prior Level of Function : Independent/Modified Independent;History of Falls (last six months)             Mobility Comments: Rollator in house, two spc community distances ADLs Comments: pt reports MOD I with ADL; wife cooks; both clean and do laundry;        OT Problem List: Decreased strength;Decreased activity tolerance;Impaired balance (sitting and/or standing);Decreased knowledge of use of DME or AE      OT Treatment/Interventions: Self-care/ADL training;Energy conservation;DME and/or AE  instruction;Therapeutic activities;Balance training;Therapeutic exercise;Patient/family education    OT Goals(Current goals can be found in the care plan section) Acute Rehab OT Goals Patient Stated Goal: go home OT Goal Formulation: With patient Time For Goal Achievement: 01/21/22 Potential to Achieve Goals: Good  OT Frequency: Min 2X/week    Co-evaluation              AM-PAC OT "6 Clicks" Daily Activity     Outcome Measure Help from another person eating meals?: None Help from another person taking care of personal grooming?: None Help from another person toileting, which includes using toliet, bedpan, or urinal?: A Little Help from another person bathing (including washing, rinsing, drying)?: A Little Help from another person to put on and taking off regular upper body clothing?: None Help from another person to put on and taking off regular lower body clothing?: A Little 6 Click Score: 21   End of Session Equipment Utilized During Treatment: Rolling walker (2 wheels);Gait belt Nurse Communication: Mobility status  Activity Tolerance: Patient tolerated treatment well Patient left: in chair;with call bell/phone within reach  OT Visit Diagnosis: Unsteadiness on feet (R26.81);History of falling (Z91.81);Muscle weakness (generalized) (M62.81)                Time: 1025-8527 OT Time Calculation (min): 36 min Charges:  OT General Charges $OT Visit: 1 Visit OT Evaluation $OT Eval Low Complexity: 1 Low  Shanon Payor, OTD OTR/L  01/07/22, 3:55 PM

## 2022-01-08 DIAGNOSIS — N3000 Acute cystitis without hematuria: Secondary | ICD-10-CM | POA: Diagnosis not present

## 2022-01-08 LAB — VITAMIN B12: Vitamin B-12: 353 pg/mL (ref 180–914)

## 2022-01-08 LAB — BASIC METABOLIC PANEL
Anion gap: 7 (ref 5–15)
BUN: 38 mg/dL — ABNORMAL HIGH (ref 8–23)
CO2: 21 mmol/L — ABNORMAL LOW (ref 22–32)
Calcium: 8.2 mg/dL — ABNORMAL LOW (ref 8.9–10.3)
Chloride: 105 mmol/L (ref 98–111)
Creatinine, Ser: 1.43 mg/dL — ABNORMAL HIGH (ref 0.61–1.24)
GFR, Estimated: 47 mL/min — ABNORMAL LOW (ref >60)
Glucose, Bld: 121 mg/dL — ABNORMAL HIGH (ref 70–99)
Potassium: 4.2 mmol/L (ref 3.5–5.1)
Sodium: 133 mmol/L — ABNORMAL LOW (ref 135–145)

## 2022-01-08 LAB — CBC
HCT: 26 % — ABNORMAL LOW (ref 39.0–52.0)
Hemoglobin: 8.2 g/dL — ABNORMAL LOW (ref 13.0–17.0)
MCH: 27.7 pg (ref 26.0–34.0)
MCHC: 31.5 g/dL (ref 30.0–36.0)
MCV: 87.8 fL (ref 80.0–100.0)
Platelets: 297 10*3/uL (ref 150–400)
RBC: 2.96 MIL/uL — ABNORMAL LOW (ref 4.22–5.81)
RDW: 15.9 % — ABNORMAL HIGH (ref 11.5–15.5)
WBC: 6.4 10*3/uL (ref 4.0–10.5)
nRBC: 0 % (ref 0.0–0.2)

## 2022-01-08 LAB — MAGNESIUM: Magnesium: 2.3 mg/dL (ref 1.7–2.4)

## 2022-01-08 LAB — URINE CULTURE

## 2022-01-08 LAB — FOLATE: Folate: 10 ng/mL (ref >5.9)

## 2022-01-08 MED ORDER — SODIUM CHLORIDE 0.9 % IV SOLN: INTRAVENOUS | Status: AC

## 2022-01-08 MED ORDER — LOSARTAN POTASSIUM 25 MG PO TABS: 25.0000 mg | ORAL_TABLET | Freq: Every day | ORAL | Status: DC

## 2022-01-08 MED ORDER — NYSTATIN 100000 UNIT/GM EX CREA
TOPICAL_CREAM | Freq: Two times a day (BID) | CUTANEOUS | Status: DC
  Filled 2022-01-08: qty 30

## 2022-01-08 MED ORDER — AMLODIPINE BESYLATE 10 MG PO TABS
10.0000 mg | ORAL_TABLET | Freq: Every day | ORAL | Status: DC
  Administered 2022-01-08: 10 mg via ORAL
  Filled 2022-01-08: qty 1

## 2022-01-08 NOTE — Progress Notes (Signed)
Physical Therapy Treatment Patient Details Name: Cesar Powell MRN: [id number] DOB: Jan 20, 1931 Today's Date: 01/08/2022   History of Present Illness PT is an 88 year old male admitted with UTI after presenting to the ED with weakness and dysuria.  PMH significant for Hypertension, hyperlipidemia, CAD, with DES placement, OSA on CPAP, metastasized prostate cancer (s/p of radiation therapy), anemia, CKD-3B    PT Comments    Pt progressing toward goals, seated in chair at start of session. Pt reported increased weakness this morning with less energy upon waking, but agreeable to PT. Pt completed seated exercises without increase in pain level, but pain in L LE reported as 5/10 when lifting leg to walk. Balance deficits noted during static balance activities of NBOS with eyes closed and vertical/lat head turns. Pt completed 5 sit-to-stands with CGA and increased time to perform demonstrating decreased strength/power and increased fall risk. Pt ambulation limited today due to bleeding of R UE IV which began while walking toward the door. Nursing was called and pt left in chair with nursing at end of session.    Recommendations for follow up therapy are one component of a multi-disciplinary discharge planning process, led by the attending physician.  Recommendations may be updated based on patient status, additional functional criteria and insurance authorization.  Follow Up Recommendations  Home health PT     Assistance Recommended at Discharge Intermittent Supervision/Assistance  Patient can return home with the following A little help with walking and/or transfers;A little help with bathing/dressing/bathroom;Assistance with cooking/housework;Help with stairs or ramp for entrance;Assist for transportation   Equipment Recommendations       Recommendations for Other Services       Precautions / Restrictions Precautions Precautions: Fall Restrictions Weight Bearing Restrictions: No      Mobility  Bed Mobility               General bed mobility comments: NT pt in chair pre/post session    Transfers Overall transfer level: Needs assistance Equipment used: Rolling walker (2 wheels) Transfers: Sit to/from Stand Sit to Stand: Supervision, Min guard           General transfer comment: Initial vc for hand placement and controlled movement, with pt demonstrating retention of instructions with repetition    Ambulation/Gait Ambulation/Gait assistance: Min guard, Supervision Gait Distance (Feet): 15 Feet Assistive device: Rolling walker (2 wheels) Gait Pattern/deviations: Decreased step length - right, Decreased step length - left, Decreased stride length, Step-through pattern       General Gait Details: Ambulation distance limited due to patient R UE IV location bleeding. Nurse was called and pts R UE wrapped with towel with pressure applied. Pt continued to ambulate back to chair.   Stairs             Wheelchair Mobility    Modified Rankin (Stroke Patients Only)       Balance Overall balance assessment: Needs assistance Sitting-balance support: No upper extremity supported, Feet supported Sitting balance-Leahy Scale: Fair Sitting balance - Comments: Pt sat in chair with back unsupported and bilat UEs reaching for RW. Has tendency to lean backward when back is unsupported.   Standing balance support: Bilateral upper extremity supported, Reliant on assistive device for balance Standing balance-Leahy Scale: Fair Standing balance comment: RW used for balance during ambulation                            Cognition Arousal/Alertness: Awake/alert Behavior During Therapy:  WFL for tasks assessed/performed Overall Cognitive Status: Within Functional Limits for tasks assessed                                 General Comments: Able to follow commands and hold conversation during ambulation.        Exercises Other  Exercises Other Exercises: Stood with bilat UEs at sides x20", eyes closed 2x20" with minor LOB and increased sway, V/L head turns x20" ea with L lean and occasional reach for RW to correct balance Other Exercises: Seated ther-ex included alt LE hip flex and LAQs x10 ea with back unsupported in chair (L LE pain monitored with pt reporting no increase in pain during seated activities) Other Exercises: Pt completed 5 sit-to-stands with 3 in a row "as quickly and safely as possible." Pt required vc to stand fully when increasing speed. No LOB and CGA. Pt demonstrated concentric/eccentric control in standing/sitting, but increased time required for movement.    General Comments        Pertinent Vitals/Pain Pain Assessment Pain Assessment: 0-10 Pain Score: 5  Pain Location: LLE Pain Descriptors / Indicators: Discomfort, Other (Comment) (when taking a step with L LE) Pain Intervention(s): Limited activity within patient's tolerance, Monitored during session, Repositioned    Home Living                          Prior Function            PT Goals (current goals can now be found in the care plan section) Acute Rehab PT Goals Patient Stated Goal: To go home. PT Goal Formulation: With patient Time For Goal Achievement: 01/21/22 Potential to Achieve Goals: Good Progress towards PT goals: Progressing toward goals    Frequency    Min 2X/week      PT Plan Current plan remains appropriate    Co-evaluation              AM-PAC PT "6 Clicks" Mobility   Outcome Measure  Help needed turning from your back to your side while in a flat bed without using bedrails?: None Help needed moving from lying on your back to sitting on the side of a flat bed without using bedrails?: None Help needed moving to and from a bed to a chair (including a wheelchair)?: A Little Help needed standing up from a chair using your arms (e.g., wheelchair or bedside chair)?: None Help needed to walk  in hospital room?: A Little Help needed climbing 3-5 steps with a railing? : A Lot 6 Click Score: 20    End of Session Equipment Utilized During Treatment: Gait belt Activity Tolerance: Patient tolerated treatment well Patient left: with nursing/sitter in room;Other (comment);in chair (hand-over to nurse at end of session due to R UE IV location bleeding/IV out of place) Nurse Communication: Mobility status;Other (comment) (IV bleeding/out of proper location) PT Visit Diagnosis: Muscle weakness (generalized) (M62.81);Unsteadiness on feet (R26.81);History of falling (Z91.81)     Time: 6629-4765 PT Time Calculation (min) (ACUTE ONLY): 23 min  Charges:                        Naesha Buckalew, SPT   Clarke Peretz 01/08/2022, 1:00 PM

## 2022-01-08 NOTE — Progress Notes (Signed)
Occupational Therapy Treatment Patient Details Name: Cesar Powell MRN: [id number] DOB: August 01, 1931 Today's Date: 01/08/2022   History of present illness PT is an 88 year old male admitted with UTI after presenting to the ED with weakness and dysuria.  PMH significant for Hypertension, hyperlipidemia, CAD, with DES placement, OSA on CPAP, metastasized prostate cancer (s/p of radiation therapy), anemia, CKD-3B   OT comments  Pt. reports 4/10 pain in the left inguinal region.  Pt. declined attempt for OOB activity this afternoon. Pt. education was provided about general A/E use for LE ADLs, and to expand his reach for items/objects as needed.  Pt. was agreeable to UE strengthening. Pt. performed BUE strengthening with yellow theraband resistance for shoulder flexion, horizontal abduction, elbow flexion, and extension for 1 set 10 reps each. UE therapeutic ex was performed to improve strength, and endurance needed for ADLs, IADLs, and transfers. Pt. Continues to benefit from OT services for ADL training, A/E training, UE there. Ex., and pt. education about home modification, and DME.    Recommendations for follow up therapy are one component of a multi-disciplinary discharge planning process, led by the attending physician.  Recommendations may be updated based on patient status, additional functional criteria and insurance authorization.    Follow Up Recommendations  Home health OT    Assistance Recommended at Discharge Intermittent Supervision/Assistance  Patient can return home with the following  A little help with walking and/or transfers;A little help with bathing/dressing/bathroom;Assistance with cooking/housework   Equipment Recommendations       Recommendations for Other Services      Precautions / Restrictions Precautions Precautions: Fall Restrictions Weight Bearing Restrictions: No       Mobility Bed Mobility                    Transfers Overall transfer level:  Needs assistance   Transfers: Sit to/from Stand Sit to Stand: Supervision, Min guard     Step pivot transfers: Min assist     General transfer comment: Mobility per chart review     Balance                                           ADL either performed or assessed with clinical judgement   ADL       Grooming: Independent;Set up;Sitting                                      Extremity/Trunk Assessment Upper Extremity Assessment Upper Extremity Assessment: Generalized weakness            Vision Patient Visual Report: No change from baseline     Perception     Praxis      Cognition Arousal/Alertness: Awake/alert Behavior During Therapy: WFL for tasks assessed/performed Overall Cognitive Status: Within Functional Limits for tasks assessed                                          Exercises Other Exercises Other Exercises: BUE strengthening with yellow theraband for bilateral shoulder flexion, horizontal abduction, bilateral elbow flexion, and extension for 1 set 10 reps each with cues for form, and technique.    Shoulder Instructions  General Comments      Pertinent Vitals/ Pain       Pain Assessment Pain Assessment: 0-10 Pain Score: 4  Pain Descriptors / Indicators: Discomfort, Other (Comment) Pain Intervention(s): Limited activity within patient's tolerance, Monitored during session  Home Living                                          Prior Functioning/Environment              Frequency  Min 2X/week        Progress Toward Goals  OT Goals(current goals can now be found in the care plan section)  Progress towards OT goals: Progressing toward goals  Acute Rehab OT Goals Patient Stated Goal: To return home OT Goal Formulation: With patient Time For Goal Achievement: 01/21/22 Potential to Achieve Goals: Good  Plan      Co-evaluation                  AM-PAC OT "6 Clicks" Daily Activity     Outcome Measure   Help from another person eating meals?: None Help from another person taking care of personal grooming?: None Help from another person toileting, which includes using toliet, bedpan, or urinal?: A Little Help from another person bathing (including washing, rinsing, drying)?: A Little Help from another person to put on and taking off regular upper body clothing?: None Help from another person to put on and taking off regular lower body clothing?: A Little 6 Click Score: 21    End of Session Equipment Utilized During Treatment: Rolling walker (2 wheels)  OT Visit Diagnosis: Unsteadiness on feet (R26.81);History of falling (Z91.81);Muscle weakness (generalized) (M62.81)   Activity Tolerance Patient tolerated treatment well   Patient Left in chair;with call bell/phone within reach   Nurse Communication Mobility status        Time: 0277-4128 OT Time Calculation (min): 23 min  Charges: OT General Charges $OT Visit: 1 Visit OT Treatments $Self Care/Home Management : 23-37 mins  Harrel Carina, MS, OTR/L   Harrel Carina 01/08/2022, 3:38 PM

## 2022-01-08 NOTE — Progress Notes (Signed)
  Progress Note   Patient: Cesar Powell GQQ:[id number] DOB: [birth year] DOA: 01/06/2022     1 DOS: the patient was seen and examined on 01/08/2022   Brief hospital course: No notes on file  Assessment and Plan: * UTI (urinary tract infection) Pt does not have fever and leukocytosis.  Not septic.  No urinary symptoms, but pt said his UTI symptoms were weakness. --urine cx grew multiple species Plan: --cont ceftriaxone for empiric tx    Coronary artery disease involving native coronary artery of native heart S/p of DES. -on ASA -Brilinta -lipitor, Imdur    Essential hypertension --BP measurement have varied widely --cont home losartan and Imdur  Chronic kidney disease, stage 3b (HCC) Baseline creatinine 1.1, his creatinine is 1.23, BUN 26, close to baseline. -Follow-up with BMP  Prostate cancer [facility]) Patient has metastasized prostate cancer.  S/p of radiation therapy.  Patient is following up with Dr. Caprice Beaver of urology/ -Continue Flomax  Obstructive sleep apnea - CPAP  Normocytic anemia Baseline hemoglobin 8-9.  His hemoglobin is 9.0, at baseline -Follow-up with CBC  Generalized weakness Likely due to UTI.  No focal neurodeficit on physical examination. -PT/OT        Subjective:  Pt reported waking up this morning feeling tired and weak, but improved later in the day.   Physical Exam:  Constitutional: NAD, AAOx3, sitting in recliner HEENT: conjunctivae and lids normal, EOMI CV: No cyanosis.   RESP: normal respiratory effort, on RA Extremities: venous stasis changes, L>R SKIN: warm, dry Neuro: II - XII grossly intact.   Psych: Normal mood and affect.  Appropriate judgement and reason   Data Reviewed:  Family Communication:   Disposition: Status is: Inpatient   Planned Discharge Destination: Home    Time spent: 35 minutes  Author: Enzo Bi, MD 01/08/2022 7:47 PM  For on call review [email or web address].

## 2022-01-08 NOTE — Progress Notes (Signed)
Patient is red in his peri and buttock area from his incontinence. Order received from Dr Billie Ruddy for nystatin

## 2022-01-08 NOTE — Telephone Encounter (Signed)
Oral Oncology Patient Advocate Encounter  Received notification from [employer] that patient has been successfully enrolled into their program to receive Orgovyx from the manufacturer at $0 out of pocket until 08/02/22.   Patient knows to call the office with questions or concerns.   Oral Oncology Clinic will continue to follow.  [place] Patient [place] Phone [phone] Fax [phone] 01/08/2022 2:26 PM

## 2022-01-09 DIAGNOSIS — N3000 Acute cystitis without hematuria: Secondary | ICD-10-CM | POA: Diagnosis not present

## 2022-01-09 LAB — BASIC METABOLIC PANEL
Anion gap: 5 (ref 5–15)
BUN: 26 mg/dL — ABNORMAL HIGH (ref 8–23)
CO2: 23 mmol/L (ref 22–32)
Calcium: 8.3 mg/dL — ABNORMAL LOW (ref 8.9–10.3)
Chloride: 106 mmol/L (ref 98–111)
Creatinine, Ser: 1.05 mg/dL (ref 0.61–1.24)
GFR, Estimated: >60 mL/min (ref >60)
Glucose, Bld: 105 mg/dL — ABNORMAL HIGH (ref 70–99)
Potassium: 4.1 mmol/L (ref 3.5–5.1)
Sodium: 134 mmol/L — ABNORMAL LOW (ref 135–145)

## 2022-01-09 LAB — CBC
HCT: 25.6 % — ABNORMAL LOW (ref 39.0–52.0)
Hemoglobin: 8.1 g/dL — ABNORMAL LOW (ref 13.0–17.0)
MCH: 27.9 pg (ref 26.0–34.0)
MCHC: 31.6 g/dL (ref 30.0–36.0)
MCV: 88.3 fL (ref 80.0–100.0)
Platelets: 297 10*3/uL (ref 150–400)
RBC: 2.9 MIL/uL — ABNORMAL LOW (ref 4.22–5.81)
RDW: 15.9 % — ABNORMAL HIGH (ref 11.5–15.5)
WBC: 4 10*3/uL (ref 4.0–10.5)
nRBC: 0 % (ref 0.0–0.2)

## 2022-01-09 LAB — MAGNESIUM: Magnesium: 2.3 mg/dL (ref 1.7–2.4)

## 2022-01-09 MED ORDER — TAMSULOSIN HCL 0.4 MG PO CAPS: 0.8000 mg | ORAL_CAPSULE | Freq: Every day | ORAL | Status: DC

## 2022-01-09 NOTE — Progress Notes (Signed)
Patient Discharge  Patient discharged home via private vehicle with son. Patient discharge instructions reviewed, and patient verbalizes understanding.All patient belongings have been returned. All PIVs removed with no redness or swelling noted at the site.

## 2022-01-09 NOTE — Plan of Care (Signed)

## 2022-01-09 NOTE — TOC Transition Note (Signed)
Transition of Care [facility]) - CM/SW Discharge Note   Patient Details  Name: Cesar Powell MRN: [id number] Date of Birth: 11/22/1930  Transition of Care [facility] CM/SW Contact:  Candie Chroman, LCSW Phone Number: 01/09/2022, 8:38 AM   Clinical Narrative:   Patient has orders to discharge home today. [facility] representative is aware. No further concerns. CSW signing off.  Final next level of care: [place] Barriers to Discharge: Barriers Resolved   Patient Goals and CMS Choice     Choice offered to / list presented to : Patient  Discharge Placement                    Patient and family notified of of transfer: 01/09/22  Discharge Plan and Services     Post Acute Care Choice: Home Health                    HH Arranged: PT, OT [facility] Agency: [place] Date [place]: 01/09/22   Representative spoke with at [place]: [place] Interventions     Readmission Risk Interventions    01/08/2022    8:31 AM 04/14/2021   12:55 PM  Readmission Risk Prevention Plan  Transportation Screening  Complete  PCP or Specialist Appt within 5-7 Days  Complete  PCP or Specialist Appt within 3-5 Days Complete   Home Care Screening  Complete  Medication Review (RN CM)  Complete  HRI or Home Care Consult Complete   Social Work Consult for [place] Planning/Counseling Complete   Palliative Care Screening Not Applicable

## 2022-01-09 NOTE — Discharge Summary (Signed)
Physician Discharge Summary   Cesar Powell  male DOB: May 07, 1931  OJJ:[id number]  PCP: Cesar Loffler, MD  Admit date: 01/06/2022 Discharge date: 01/09/2022  Admitted From: home Disposition:  home Home Health: Yes CODE STATUS: Full code  Hospital Course:  For full details, please see H&P, progress notes, consult notes and ancillary notes.  Briefly,  Cesar Powell is a 88 y.o. male with medical history significant of Hypertension, CAD, with DES placement, OSA on CPAP, metastasized prostate cancer (s/p radiation therapy), anemia, CKD-3B, who presented with weakness and dysuria.  * UTI (urinary tract infection) Pt does not have fever and leukocytosis.  Not septic.  No urinary symptoms, but pt said his UTI symptoms were weakness. --urine cx grew multiple species --Pt received 3 days of ceftriaxone for empiric tx   Coronary artery disease involving native coronary artery of native heart S/p of DES. -cont ASA and Brilinta -lipitor   Essential hypertension --BP measurement have varied widely --cont home losartan and Imdur   Chronic kidney disease, stage 2, not stage 3b (HCC) Baseline creatinine 1.1. --pt had acute jump in Cr from 0.86 to 1.43 from 6/7 to 6/8 with no reason, and back to 1.05 again on 6/9.  Looking back in prior records, pt had had isolated Cr jumps in the past.  Do not think this is AKI.  Not Clinically Significant.   Prostate cancer [facility]) S/p of radiation therapy.  Patient is following up with Dr. Diamantina Powell of urology. -Continue Flomax   Obstructive sleep apnea - CPAP   Normocytic anemia Baseline hemoglobin 8-9.     Generalized weakness Likely due to UTI.  No focal neurodeficit on physical examination.  Obesity, BMI:  32.26   Discharge Diagnoses:  Principal Problem:   UTI (urinary tract infection) Active Problems:   Coronary artery disease involving native coronary artery of native heart   Essential hypertension   Chronic kidney disease,  stage 3b (HCC)   Prostate cancer (HCC)   Obstructive sleep apnea   Normocytic anemia   Generalized weakness   30 Day Unplanned Readmission Risk Score    Flowsheet Row ED to Hosp-Admission (Current) from 01/06/2022 in [place]  30 Day Unplanned Readmission Risk Score (%) 24.64 Filed at 01/09/2022 0401       This score is the patient's risk of an unplanned readmission within 30 days of being discharged (0 -100%). The score is based on dignosis, age, lab data, medications, orders, and past utilization.   Low:  0-14.9   Medium: 15-21.9   High: 22-29.9   Extreme: 30 and above         Discharge Instructions:  Allergies as of 01/09/2022   No Active Allergies      Medication List     TAKE these medications    aspirin 81 MG chewable tablet Chew 1 tablet (81 mg total) by mouth daily.   atorvastatin 80 MG tablet Commonly known as: LIPITOR Take 1 tablet (80 mg total) by mouth daily.   isosorbide mononitrate 30 MG 24 hr tablet Commonly known as: IMDUR Take 15 mg by mouth daily.   losartan 25 MG tablet Commonly known as: COZAAR Take 0.5 tablets (12.5 mg total) by mouth daily.   nitroGLYCERIN 0.4 MG SL tablet Commonly known as: NITROSTAT Place 0.4 mg under the tongue every 5 (five) minutes as needed for chest pain.   pantoprazole 40 MG tablet Commonly known as: Protonix Take 1 tablet (40 mg total) by mouth 2 (  two) times daily before a meal.   tamsulosin 0.4 MG Caps capsule Commonly known as: FLOMAX Take 2 capsules (0.8 mg total) by mouth daily after supper. Home med. What changed:  how much to take additional instructions   ticagrelor 90 MG Tabs tablet Commonly known as: Brilinta Take 1 tablet (90 mg total) by mouth 2 (two) times daily.   Vitamin D3 50 MCG (2000 UT) Tabs Take 4,000 Units by mouth daily.         Follow-up Information     Copland, Spencer, MD Follow up in 1 week(s).   Specialties: Family Medicine, Sports  Medicine Contact information: [place] [address] [phone]                 No Active Allergies   The results of significant diagnostics from this hospitalization (including imaging, microbiology, ancillary and laboratory) are listed below for reference.   Consultations:   Procedures/Studies: [place] ASCITES (ABDOMEN LIMITED)  Result Date: 01/06/2022 CLINICAL DATA:  Abdominal distension question ascites EXAM: LIMITED ABDOMEN ULTRASOUND FOR ASCITES TECHNIQUE: Limited ultrasound survey for ascites was performed in all four abdominal quadrants. COMPARISON:  CT chest abdomen pelvis 04/02/2021 FINDINGS: Survey imaging of the 4 quadrants was performed. No ascites identified. IMPRESSION: No ascites. Electronically Signed   By: Lavonia Dana M.D.   On: 01/06/2022 14:44   US Abdomen Limited RUQ (LIVER/GB)  Result Date: 01/06/2022 CLINICAL DATA:  RIGHT upper quadrant pain EXAM: ULTRASOUND ABDOMEN LIMITED RIGHT UPPER QUADRANT COMPARISON:  CT chest abdomen pelvis 04/02/2021 FINDINGS: Gallbladder: Normally distended without stones or wall thickening. No pericholecystic fluid or sonographic Murphy sign. Common bile duct: Diameter: 3 mm, normal Liver: Normal echogenicity without mass or nodularity. No intrahepatic biliary dilatation. Portal vein is patent on color Doppler imaging with normal direction of blood flow towards the liver. Other: N/A IMPRESSION: Normal exam. Electronically Signed   By: Lavonia Dana M.D.   On: 01/06/2022 14:43   DG Chest 2 View  Result Date: 01/06/2022 CLINICAL DATA:  Weakness. EXAM: CHEST - 2 VIEW COMPARISON:  12/12/2021 FINDINGS: Cardiac enlargement. Aortic atherosclerotic calcifications. No signs of pleural effusion or edema. The lungs are hypoinflated. No airspace opacities. Visualized osseous structures appear intact. IMPRESSION: No active cardiopulmonary abnormalities. Electronically Signed   By: Kerby Moors M.D.   On: 01/06/2022 12:56   VAS  US CAROTID  Result Date: 12/14/2021 Carotid Arterial Duplex Study Patient Name:  Cesar Powell  Date of Exam:   12/13/2021 Medical Rec #: [id number]         Accession #:    [id number] Date of Birth: 08/20/1930         Patient Gender: M Patient Age:   38 years Exam Location:  [facility] Procedure:      VAS US CAROTID Referring Phys: Gerlean Ren --------------------------------------------------------------------------------  Indications:       Syncope. Risk Factors:      Hypertension, past history of smoking, coronary artery                    disease. Limitations        Today's exam was limited due to the body habitus of the                    patient and patient movement, restricted mobility. Comparison Study:  No prior study Performing Technologist: Maudry Mayhew MHA, RDMS, RVT, RDCS  Examination Guidelines: A complete evaluation includes B-mode imaging, spectral Doppler,  color Doppler, and power Doppler as needed of all accessible portions of each vessel. Bilateral testing is considered an integral part of a complete examination. Limited examinations for reoccurring indications may be performed as noted.  Right Carotid Findings: +---------+--------+-------+--------+---------------------------------+--------+          PSV cm/sEDV    StenosisPlaque Description               Comments                  cm/s                                                     +---------+--------+-------+--------+---------------------------------+--------+ CCA Prox 49      10                                                       +---------+--------+-------+--------+---------------------------------+--------+ CCA      82      14             heterogenous and irregular                Distal                                                                    +---------+--------+-------+--------+---------------------------------+--------+ ICA Prox 107     14             smooth and  homogeneous                    +---------+--------+-------+--------+---------------------------------+--------+ ICA      106     23                                                       Distal                                                                    +---------+--------+-------+--------+---------------------------------+--------+ ECA      230     13             irregular, heterogenous and                                               calcific                                  +---------+--------+-------+--------+---------------------------------+--------+ +----------+--------+-------+----------------+-------------------+  PSV cm/sEDV cmsDescribe        Arm Pressure (mmHG) +----------+--------+-------+----------------+-------------------+ [id number]            Multiphasic, WNL                    +----------+--------+-------+----------------+-------------------+ +---------+--------+--+--------+-+---------+ VertebralPSV cm/s36EDV cm/s5Antegrade +---------+--------+--+--------+-+---------+  Left Carotid Findings: +----------+--------+--------+--------+------------------------------+---------+           PSV cm/sEDV cm/sStenosisPlaque Description            Comments  +----------+--------+--------+--------+------------------------------+---------+ CCA Prox  109     11                                                      +----------+--------+--------+--------+------------------------------+---------+ CCA Distal67      12              smooth, heterogenous and      Shadowing                                   calcific                                +----------+--------+--------+--------+------------------------------+---------+ ICA Prox  66      16              heterogenous, irregular and                                               calcific                                 +----------+--------+--------+--------+------------------------------+---------+ ICA Distal91      19                                                      +----------+--------+--------+--------+------------------------------+---------+ ECA       157                     heterogenous and calcific     shadowing +----------+--------+--------+--------+------------------------------+---------+ +----------+--------+--------+----------------+-------------------+           PSV cm/sEDV cm/sDescribe        Arm Pressure (mmHG) +----------+--------+--------+----------------+-------------------+ [id number]             Multiphasic, WNL                    +----------+--------+--------+----------------+-------------------+ +---------+--------+--+--------+--+---------+ VertebralPSV cm/s91EDV cm/s28Antegrade +---------+--------+--+--------+--+---------+   Summary: Right Carotid: Velocities in the right ICA are consistent with a 1-39% stenosis.                The ECA appears >50% stenosed. Left Carotid: Velocities in the left ICA are consistent with a 1-39% stenosis. Vertebrals:  Bilateral vertebral arteries demonstrate antegrade flow. Subclavians: Normal flow hemodynamics were seen in bilateral subclavian              arteries. *See table(s) above for  measurements and observations.  Electronically signed by Monica Martinez MD on 12/14/2021 at 12:37:43 PM.    Final    CT Head Wo Contrast  Result Date: 12/12/2021 CLINICAL DATA:  Syncopal episode, fall EXAM: CT HEAD WITHOUT CONTRAST CT CERVICAL SPINE WITHOUT CONTRAST TECHNIQUE: Multidetector CT imaging of the head and cervical spine was performed following the standard protocol without intravenous contrast. Multiplanar CT image reconstructions of the cervical spine were also generated. RADIATION DOSE REDUCTION: This exam was performed according to the departmental dose-optimization program which includes automated exposure control, adjustment of  the mA and/or kV according to patient size and/or use of iterative reconstruction technique. COMPARISON:  CT head 05/13/2021, no prior CT cervical spine. FINDINGS: CT HEAD FINDINGS Brain: No evidence of acute infarction, hemorrhage, cerebral edema, mass, mass effect, or midline shift. No hydrocephalus or extra-axial fluid collection. Periventricular white matter changes, likely the sequela of chronic small vessel ischemic disease. Vascular: No hyperdense vessel. Atherosclerotic calcifications in the intracranial carotid and vertebral arteries. Skull: Normal. Negative for fracture or focal lesion. Sinuses/Orbits: Mucosal thickening in the left maxillary sinus and anterior ethmoid air cells. Status post bilateral lens replacements. Other: The mastoid air cells are well aerated. CT CERVICAL SPINE FINDINGS Alignment: No listhesis. Skull base and vertebrae: No acute fracture or suspicious osseous lesion. Soft tissues and spinal canal: No prevertebral fluid or swelling. No visible canal hematoma. Disc levels: Multilevel degenerative changes, with mild spinal canal stenosis at C3-C4 and C5-C6. Multilevel uncovertebral and facet arthropathy, which causes severe neural foraminal narrowing bilaterally at C3-C4 and C4-C5, and on the left at C5-C6. Upper chest: No focal pulmonary opacity or pleural effusion. Other: None. IMPRESSION: 1.  No acute intracranial process. 2.  No acute fracture or traumatic listhesis in the cervical spine. Electronically Signed   By: Merilyn Baba M.D.   On: 12/12/2021 19:20   CT Cervical Spine Wo Contrast  Result Date: 12/12/2021 CLINICAL DATA:  Syncopal episode, fall EXAM: CT HEAD WITHOUT CONTRAST CT CERVICAL SPINE WITHOUT CONTRAST TECHNIQUE: Multidetector CT imaging of the head and cervical spine was performed following the standard protocol without intravenous contrast. Multiplanar CT image reconstructions of the cervical spine were also generated. RADIATION DOSE REDUCTION: This exam was  performed according to the departmental dose-optimization program which includes automated exposure control, adjustment of the mA and/or kV according to patient size and/or use of iterative reconstruction technique. COMPARISON:  CT head 05/13/2021, no prior CT cervical spine. FINDINGS: CT HEAD FINDINGS Brain: No evidence of acute infarction, hemorrhage, cerebral edema, mass, mass effect, or midline shift. No hydrocephalus or extra-axial fluid collection. Periventricular white matter changes, likely the sequela of chronic small vessel ischemic disease. Vascular: No hyperdense vessel. Atherosclerotic calcifications in the intracranial carotid and vertebral arteries. Skull: Normal. Negative for fracture or focal lesion. Sinuses/Orbits: Mucosal thickening in the left maxillary sinus and anterior ethmoid air cells. Status post bilateral lens replacements. Other: The mastoid air cells are well aerated. CT CERVICAL SPINE FINDINGS Alignment: No listhesis. Skull base and vertebrae: No acute fracture or suspicious osseous lesion. Soft tissues and spinal canal: No prevertebral fluid or swelling. No visible canal hematoma. Disc levels: Multilevel degenerative changes, with mild spinal canal stenosis at C3-C4 and C5-C6. Multilevel uncovertebral and facet arthropathy, which causes severe neural foraminal narrowing bilaterally at C3-C4 and C4-C5, and on the left at C5-C6. Upper chest: No focal pulmonary opacity or pleural effusion. Other: None. IMPRESSION: 1.  No acute intracranial process. 2.  No acute fracture or traumatic listhesis in  the cervical spine. Electronically Signed   By: Merilyn Baba M.D.   On: 12/12/2021 19:20   DG Chest Portable 1 View  Result Date: 12/12/2021 CLINICAL DATA:  Syncope and subsequent fall. EXAM: PORTABLE CHEST 1 VIEW COMPARISON:  August 21, 2021 FINDINGS: The heart size and mediastinal contours are within normal limits. Both lungs are clear. Multilevel degenerative changes seen throughout the  thoracic spine. IMPRESSION: Stable exam without acute or active cardiopulmonary disease. Electronically Signed   By: Virgina Norfolk M.D.   On: 12/12/2021 19:05      Labs: BNP (last 3 results) Recent Labs    04/13/21 2128 08/21/21 1524  BNP 481.6* 485.4*   Basic Metabolic Panel: Recent Labs  Lab 01/06/22 0945 01/07/22 0438 01/08/22 0428 01/09/22 0431  NA 135 135 133* 134*  K 4.2 4.2 4.2 4.1  CL 105 105 105 106  CO2 26 24 21* 23  GLUCOSE 108* 114* 121* 105*  BUN 26* 24* 38* 26*  CREATININE 1.23 0.86 1.43* 1.05  CALCIUM 8.7* 8.6* 8.2* 8.3*  MG  --   --  2.3 2.3   Liver Function Tests: No results for input(s): "AST", "ALT", "ALKPHOS", "BILITOT", "PROT", "ALBUMIN" in the last 168 hours. No results for input(s): "LIPASE", "AMYLASE" in the last 168 hours. No results for input(s): "AMMONIA" in the last 168 hours. CBC: Recent Labs  Lab 01/06/22 0945 01/07/22 0438 01/08/22 0428 01/09/22 0431  WBC 6.4 6.2 6.4 4.0  HGB 9.0* 8.8* 8.2* 8.1*  HCT 29.1* 28.0* 26.0* 25.6*  MCV 89.0 89.2 87.8 88.3  PLT 301 290 297 297   Cardiac Enzymes: No results for input(s): "CKTOTAL", "CKMB", "CKMBINDEX", "TROPONINI" in the last 168 hours. BNP: Invalid input(s): "POCBNP" CBG: No results for input(s): "GLUCAP" in the last 168 hours. D-Dimer No results for input(s): "DDIMER" in the last 72 hours. Hgb A1c No results for input(s): "HGBA1C" in the last 72 hours. Lipid Profile No results for input(s): "CHOL", "HDL", "LDLCALC", "TRIG", "CHOLHDL", "LDLDIRECT" in the last 72 hours. Thyroid function studies No results for input(s): "TSH", "T4TOTAL", "T3FREE", "THYROIDAB" in the last 72 hours.  Invalid input(s): "FREET3" Anemia work up Recent Labs    01/08/22 0428 01/08/22 1004  VITAMINB12  --  353  FOLATE 10.0  --    Urinalysis    Component Value Date/Time   COLORURINE YELLOW (A) 01/06/2022 1312   APPEARANCEUR TURBID (A) 01/06/2022 1312   APPEARANCEUR Cloudy (A) 08/13/2021 1047    LABSPEC 1.017 01/06/2022 1312   PHURINE 7.0 01/06/2022 1312   GLUCOSEU NEGATIVE 01/06/2022 1312   HGBUR SMALL (A) 01/06/2022 1312   HGBUR negative 06/13/2007 1105   BILIRUBINUR NEGATIVE 01/06/2022 1312   BILIRUBINUR Negative 08/13/2021 1047   KETONESUR NEGATIVE 01/06/2022 1312   PROTEINUR 100 (A) 01/06/2022 1312   UROBILINOGEN 0.2 06/01/2016 0953   UROBILINOGEN negative 06/13/2007 1105   NITRITE NEGATIVE 01/06/2022 1312   LEUKOCYTESUR MODERATE (A) 01/06/2022 1312   Sepsis Labs Recent Labs  Lab 01/06/22 0945 01/07/22 0438 01/08/22 0428 01/09/22 0431  WBC 6.4 6.2 6.4 4.0   Microbiology Recent Results (from the past 240 hour(s))  Urine Culture     Status: Abnormal   Collection Time: 01/06/22  1:12 PM   Specimen: Urine, Random  Result Value Ref Range Status   Specimen Description   Final    URINE, RANDOM Performed at [facility], [address], [place], [place] [address]    Special Requests   Final    NONE Performed at  [facility], [address], [place], [place] [address]    Culture MULTIPLE SPECIES PRESENT, SUGGEST RECOLLECTION (A)  Final   Report Status 01/08/2022 FINAL  Final     Total time spend on discharging this patient, including the last patient exam, discussing the hospital stay, instructions for ongoing care as it relates to all pertinent caregivers, as well as preparing the medical discharge records, prescriptions, and/or referrals as applicable, is 40 minutes.    Enzo Bi, MD  [facility] 01/09/2022, 7:44 AM

## 2022-01-09 NOTE — Consult Note (Signed)
   [facility] Inpatient Consult   01/09/2022  Cesar Powell Sep 28, 1930 [id number]  [place]  Accountable Care Organization [ACO] Patient: Medicare ACO REACH  Remote review for [facility] Liaison coverage partner, patient at [facility]  Primary Care Provider: Owens Loffler, MD, [place] is an Embedded provider listed for the [facility] followup calls for post hospital  Patient transitioned home no care coordination or chronic care management needs  assessed from review of PT/OT and inpatient [facility] team.  Natividad Brood, RN BSN [facility] Liaison  [phone] business mobile phone Toll free office [phone]  Fax number: [phone] [email or web address]

## 2022-01-10 DIAGNOSIS — N390 Urinary tract infection, site not specified: Secondary | ICD-10-CM | POA: Diagnosis not present

## 2022-01-10 DIAGNOSIS — S0003XD Contusion of scalp, subsequent encounter: Secondary | ICD-10-CM | POA: Diagnosis not present

## 2022-01-10 DIAGNOSIS — G4733 Obstructive sleep apnea (adult) (pediatric): Secondary | ICD-10-CM | POA: Diagnosis not present

## 2022-01-10 DIAGNOSIS — N182 Chronic kidney disease, stage 2 (mild): Secondary | ICD-10-CM | POA: Diagnosis not present

## 2022-01-10 DIAGNOSIS — I252 Old myocardial infarction: Secondary | ICD-10-CM | POA: Diagnosis not present

## 2022-01-10 DIAGNOSIS — K5790 Diverticulosis of intestine, part unspecified, without perforation or abscess without bleeding: Secondary | ICD-10-CM | POA: Diagnosis not present

## 2022-01-10 DIAGNOSIS — E8881 Metabolic syndrome: Secondary | ICD-10-CM | POA: Diagnosis not present

## 2022-01-10 DIAGNOSIS — I2510 Atherosclerotic heart disease of native coronary artery without angina pectoris: Secondary | ICD-10-CM | POA: Diagnosis not present

## 2022-01-10 DIAGNOSIS — C61 Malignant neoplasm of prostate: Secondary | ICD-10-CM | POA: Diagnosis not present

## 2022-01-10 DIAGNOSIS — R55 Syncope and collapse: Secondary | ICD-10-CM | POA: Diagnosis not present

## 2022-01-10 DIAGNOSIS — Z9181 History of falling: Secondary | ICD-10-CM | POA: Diagnosis not present

## 2022-01-10 DIAGNOSIS — D630 Anemia in neoplastic disease: Secondary | ICD-10-CM | POA: Diagnosis not present

## 2022-01-10 DIAGNOSIS — Z7901 Long term (current) use of anticoagulants: Secondary | ICD-10-CM | POA: Diagnosis not present

## 2022-01-10 DIAGNOSIS — D631 Anemia in chronic kidney disease: Secondary | ICD-10-CM | POA: Diagnosis not present

## 2022-01-10 DIAGNOSIS — R531 Weakness: Secondary | ICD-10-CM | POA: Diagnosis not present

## 2022-01-10 DIAGNOSIS — I129 Hypertensive chronic kidney disease with stage 1 through stage 4 chronic kidney disease, or unspecified chronic kidney disease: Secondary | ICD-10-CM | POA: Diagnosis not present

## 2022-01-10 DIAGNOSIS — R339 Retention of urine, unspecified: Secondary | ICD-10-CM | POA: Diagnosis not present

## 2022-01-12 ENCOUNTER — Telehealth: Payer: Self-pay

## 2022-01-12 DIAGNOSIS — M9905 Segmental and somatic dysfunction of pelvic region: Secondary | ICD-10-CM | POA: Diagnosis not present

## 2022-01-12 DIAGNOSIS — M62830 Muscle spasm of back: Secondary | ICD-10-CM | POA: Diagnosis not present

## 2022-01-12 DIAGNOSIS — M9903 Segmental and somatic dysfunction of lumbar region: Secondary | ICD-10-CM | POA: Diagnosis not present

## 2022-01-12 DIAGNOSIS — M5136 Other intervertebral disc degeneration, lumbar region: Secondary | ICD-10-CM | POA: Diagnosis not present

## 2022-01-12 NOTE — Telephone Encounter (Signed)
Transition Care Management Follow-up Telephone Call Date of discharge and from where: [facility] 01-09-22 Dx: UTI How have you been since you were released from the hospital? Doing ok  Any questions or concerns? No  Items Reviewed: Did the pt receive and understand the discharge instructions provided? Yes  Medications obtained and verified? Yes  Other? No  Any new allergies since your discharge? No  Dietary orders reviewed? Yes Do you have support at home? Yes   Home Care and Equipment/Supplies: Were home health services ordered? Yes PT If so, what is the name of the agency? [facility] Has the agency set up a time to come to the patient's home? yes Were any new equipment or medical supplies ordered?  No What is the name of the medical supply agency? [facility] Were you able to get the supplies/equipment? not applicable Do you have any questions related to the use of the equipment or supplies? No  Functional Questionnaire: (I = Independent and D = Dependent) ADLs: I  Bathing/Dressing- I  Meal Prep- I  Eating- I  Maintaining continence- I  Transferring/Ambulation- I  Managing Meds- I  Follow up appointments reviewed:  PCP Hospital f/u appt confirmed? Yes  Scheduled to see Dr Lorelei Pont on 01-14-22 @ [facility] f/u appt confirmed? No . Are transportation arrangements needed? No  If their condition worsens, is the pt aware to call PCP or go to the Emergency Dept.? Yes Was the patient provided with contact information for the PCP's office or ED? Yes Was to pt encouraged to call back with questions or concerns? Yes

## 2022-01-13 ENCOUNTER — Other Ambulatory Visit: Payer: Self-pay

## 2022-01-13 ENCOUNTER — Inpatient Hospital Stay: Payer: Medicare Other | Attending: Oncology

## 2022-01-13 ENCOUNTER — Encounter: Payer: Self-pay | Admitting: Oncology

## 2022-01-13 ENCOUNTER — Inpatient Hospital Stay: Payer: Medicare Other | Admitting: Pharmacist

## 2022-01-13 ENCOUNTER — Inpatient Hospital Stay (HOSPITAL_BASED_OUTPATIENT_CLINIC_OR_DEPARTMENT_OTHER): Payer: Medicare Other | Admitting: Oncology

## 2022-01-13 VITALS — BP 128/58 | HR 98 | Temp 96.4°F | Resp 20 | Wt 195.9 lb

## 2022-01-13 DIAGNOSIS — C61 Malignant neoplasm of prostate: Secondary | ICD-10-CM

## 2022-01-13 DIAGNOSIS — Z87891 Personal history of nicotine dependence: Secondary | ICD-10-CM | POA: Insufficient documentation

## 2022-01-13 DIAGNOSIS — Z923 Personal history of irradiation: Secondary | ICD-10-CM | POA: Diagnosis not present

## 2022-01-13 DIAGNOSIS — I2510 Atherosclerotic heart disease of native coronary artery without angina pectoris: Secondary | ICD-10-CM | POA: Diagnosis not present

## 2022-01-13 DIAGNOSIS — M79605 Pain in left leg: Secondary | ICD-10-CM | POA: Diagnosis not present

## 2022-01-13 DIAGNOSIS — I252 Old myocardial infarction: Secondary | ICD-10-CM | POA: Insufficient documentation

## 2022-01-13 DIAGNOSIS — Z801 Family history of malignant neoplasm of trachea, bronchus and lung: Secondary | ICD-10-CM | POA: Insufficient documentation

## 2022-01-13 DIAGNOSIS — D649 Anemia, unspecified: Secondary | ICD-10-CM

## 2022-01-13 DIAGNOSIS — Z79899 Other long term (current) drug therapy: Secondary | ICD-10-CM | POA: Diagnosis not present

## 2022-01-13 DIAGNOSIS — R140 Abdominal distension (gaseous): Secondary | ICD-10-CM | POA: Insufficient documentation

## 2022-01-13 DIAGNOSIS — Z8249 Family history of ischemic heart disease and other diseases of the circulatory system: Secondary | ICD-10-CM | POA: Diagnosis not present

## 2022-01-13 DIAGNOSIS — R1011 Right upper quadrant pain: Secondary | ICD-10-CM | POA: Diagnosis not present

## 2022-01-13 DIAGNOSIS — M549 Dorsalgia, unspecified: Secondary | ICD-10-CM | POA: Diagnosis not present

## 2022-01-13 DIAGNOSIS — R531 Weakness: Secondary | ICD-10-CM | POA: Diagnosis not present

## 2022-01-13 DIAGNOSIS — Z7289 Other problems related to lifestyle: Secondary | ICD-10-CM | POA: Diagnosis not present

## 2022-01-13 DIAGNOSIS — M79604 Pain in right leg: Secondary | ICD-10-CM | POA: Insufficient documentation

## 2022-01-13 LAB — CBC WITH DIFFERENTIAL/PLATELET
Abs Immature Granulocytes: 0.04 10*3/uL (ref 0.00–0.07)
Basophils Absolute: 0 10*3/uL (ref 0.0–0.1)
Basophils Relative: 0 %
Eosinophils Absolute: 0.1 10*3/uL (ref 0.0–0.5)
Eosinophils Relative: 2 %
HCT: 28 % — ABNORMAL LOW (ref 39.0–52.0)
Hemoglobin: 8.7 g/dL — ABNORMAL LOW (ref 13.0–17.0)
Immature Granulocytes: 1 %
Lymphocytes Relative: 9 %
Lymphs Abs: 0.6 10*3/uL — ABNORMAL LOW (ref 0.7–4.0)
MCH: 28.3 pg (ref 26.0–34.0)
MCHC: 31.1 g/dL (ref 30.0–36.0)
MCV: 91.2 fL (ref 80.0–100.0)
Monocytes Absolute: 0.6 10*3/uL (ref 0.1–1.0)
Monocytes Relative: 9 %
Neutro Abs: 5.1 10*3/uL (ref 1.7–7.7)
Neutrophils Relative %: 79 %
Platelets: 336 10*3/uL (ref 150–400)
RBC: 3.07 MIL/uL — ABNORMAL LOW (ref 4.22–5.81)
RDW: 15.7 % — ABNORMAL HIGH (ref 11.5–15.5)
WBC: 6.3 10*3/uL (ref 4.0–10.5)
nRBC: 0 % (ref 0.0–0.2)

## 2022-01-13 LAB — PSA: Prostatic Specific Antigen: 24.04 ng/mL — ABNORMAL HIGH (ref 0.00–4.00)

## 2022-01-13 LAB — COMPREHENSIVE METABOLIC PANEL
ALT: 24 U/L (ref 0–44)
AST: 21 U/L (ref 15–41)
Albumin: 3.2 g/dL — ABNORMAL LOW (ref 3.5–5.0)
Alkaline Phosphatase: 83 U/L (ref 38–126)
Anion gap: 7 (ref 5–15)
BUN: 31 mg/dL — ABNORMAL HIGH (ref 8–23)
CO2: 25 mmol/L (ref 22–32)
Calcium: 8.7 mg/dL — ABNORMAL LOW (ref 8.9–10.3)
Chloride: 104 mmol/L (ref 98–111)
Creatinine, Ser: 1.27 mg/dL — ABNORMAL HIGH (ref 0.61–1.24)
GFR, Estimated: 54 mL/min — ABNORMAL LOW (ref >60)
Glucose, Bld: 103 mg/dL — ABNORMAL HIGH (ref 70–99)
Potassium: 5.1 mmol/L (ref 3.5–5.1)
Sodium: 136 mmol/L (ref 135–145)
Total Bilirubin: 0.7 mg/dL (ref 0.3–1.2)
Total Protein: 7.6 g/dL (ref 6.5–8.1)

## 2022-01-13 LAB — SAMPLE TO BLOOD BANK

## 2022-01-13 NOTE — Progress Notes (Signed)
Hematology/Oncology Consult note [facility]  [phone] [phone]  Patient Care Team: Owens Loffler, MD as PCP - General Leandrew Koyanagi, MD as Referring Physician (Ophthalmology) Nickie Retort, MD (Inactive) as Consulting Physician (Urology) Noreene Filbert, MD as Radiation Oncologist (Radiation Oncology)   Name of the patient: Cesar Powell  [id number]  1931/06/13   Date of visit: 01/13/22  Diagnosis- castrate resistant metastatic prostate cancer  Chief complaint/ Reason for visit-routine follow-up of prostate cancer and anemia  Heme/Onc history: patient is a 88 year old gentleman with no significant past medical problems.  He has a history of prostate cancer that was diagnosed and treated with radiation in 2005.  At that point he his PSA was about 0.03 for a long time.  Then started on Axiron for low testosterone.  PSA eventually went up to as high as 18 2015.  At that point he was started on Lupron his PSA doubling time is around 10 months. Most recently since October 2018 after his PSA went down from 3.6-1.7 and has been gradually increasing to 1.3,1.5 and 2 indicating a PSA doubling time of 9.9 months.  Testosterone levels continue to be suppressed.  He has not started oral anti androgen therapy yet.    patient was last seen by me in September 2019 discussed adding oral antiandrogens like apalutamide versus enzalutamide to Lupron in castrate resistant nonmetastatic prostate cancer.  After discussing risks and benefits patient did not wish to proceed.Patient stopped lupron in feb 2020 but did restart after 1 year in February 2021.  Patient received IMRT for his pelvic lymph nodes.   Patient has had significant anemia with a hemoglobin that has fluctuated between 8-9 since August 2022.  Prior to that his hemoglobin was stable between 11-12.Results of anemia work-up in the past have been as follows: Ferritin levels elevated  in the 400s.  TIBC low at 225 with an iron saturation of 12%.  ANA comprehensive panel negative.  TSH and folate normal.  LDH normal.  Haptoglobin elevated at 427.  B12 levels normal.  Patient had a bone marrow biopsy given his significant anemia which shows slightly hypercellular marrow with myeloid hyperplasia but no evidence of leukemia lymphoma or high-grade dysplasia.  Erythroid precursors were decreased in numbers with occasional atypia.  Granulocytic precursors no overt dysplasia and megakaryocytes quantitatively and qualitatively unremarkable      Interval history-patient was recently admitted to the hospital for another episode of fatigue and was treated for possible UTI.  He continues to report lower pelvic pain as well as pain that radiates to his bilateral lower extremities.  States that he has been as active as before and able to walk for longer period of time.  ECOG PS- 2 Pain scale- 3 Opioid associated constipation- no  Review of systems- Review of Systems  Constitutional:  Negative for chills, fever, malaise/fatigue and weight loss.  HENT:  Negative for congestion, ear discharge and nosebleeds.   Eyes:  Negative for blurred vision.  Respiratory:  Negative for cough, hemoptysis, sputum production, shortness of breath and wheezing.   Cardiovascular:  Negative for chest pain, palpitations, orthopnea and claudication.  Gastrointestinal:  Negative for abdominal pain, blood in stool, constipation, diarrhea, heartburn, melena, nausea and vomiting.  Genitourinary:  Negative for dysuria, flank pain, frequency, hematuria and urgency.  Musculoskeletal:  Positive for back pain. Negative for joint pain and myalgias.  Skin:  Negative for rash.  Neurological:  Negative for dizziness, tingling, focal weakness, seizures, weakness and  headaches.  Endo/Heme/Allergies:  Does not bruise/bleed easily.  Psychiatric/Behavioral:  Negative for depression and suicidal ideas. The patient does not have  insomnia.        No Known Allergies   Past Medical History:  Diagnosis Date   Coronary artery disease involving native coronary artery of native heart 08/25/2021   H/O prostate cancer    was treated with radiation   Hypertension    Local recurrence of prostate cancer ([place]) 06/21/2004   Qualifier: Diagnosis of  By: Fuller Plan CMA (AAMA), Lugene     Myocardial infarction ([place]) 2022   Obstructive sleep apnea 06/22/2007   NPSG [place] 1979 Unattended Home sleep Study- 05/10/14- Confirms severe OSA, AHI 41.8/ hr, weight 230 pounds CPAP and also Transcend portable CPAP auto 8-15     Sleep apnea 1977   uses C-pap      Past Surgical History:  Procedure Laterality Date   ABDOMINAL SURGERY     ruptured intestines    CATARACT EXTRACTION W/PHACO Left 01/29/2016   Procedure: CATARACT EXTRACTION PHACO AND INTRAOCULAR LENS PLACEMENT ([place]) left eye;  Surgeon: Leandrew Koyanagi, MD;  Location: [place];  Service: Ophthalmology;  Laterality: Left;  RESTOR LENS   CATARACT EXTRACTION W/PHACO Right 10/21/2016   Procedure: CATARACT EXTRACTION PHACO AND INTRAOCULAR LENS PLACEMENT ([facility])  Right restor toric lens;  Surgeon: Leandrew Koyanagi, MD;  Location: [place];  Service: Ophthalmology;  Laterality: Right;  restor toric lens   CORONARY/GRAFT ACUTE MI REVASCULARIZATION N/A 05/13/2021   Procedure: Coronary/Graft Acute MI Revascularization;  Surgeon: Nelva Bush, MD;  Location: [place] [facility];  Service: Cardiovascular;  Laterality: N/A;   CYSTOSCOPY WITH LITHOLAPAXY N/A 08/14/2016   Procedure: CYSTOSCOPY WITH LITHOLAPAXY;  Surgeon: Nickie Retort, MD;  Location: [facility];  Service: Urology;  Laterality: N/A;   FEMUR IM NAIL Right 10/08/2014   Procedure: INTRAMEDULLARY (IM) NAIL FEMORAL;  Surgeon: Rod Can, MD;  Location: [place];  Service: Orthopedics;  Laterality: Right;  PER DANIELLE 110 MIN   HOLMIUM LASER APPLICATION  12/06/3974   Procedure: HOLMIUM LASER  APPLICATION;  Surgeon: Nickie Retort, MD;  Location: [facility];  Service: Urology;;   LEFT HEART CATH AND CORONARY ANGIOGRAPHY N/A 05/13/2021   Procedure: LEFT HEART CATH AND CORONARY ANGIOGRAPHY;  Surgeon: Nelva Bush, MD;  Location: [place] [facility];  Service: Cardiovascular;  Laterality: N/A;   OTHER SURGICAL HISTORY  2006   Prostate Radiation Surgery    RADIOACTIVE SEED IMPLANT N/A 10/30/2019   Procedure: RADIOACTIVE SEED IMPLANT/BRACHYTHERAPY IMPLANT;  Surgeon: Billey Co, MD;  Location: [facility];  Service: Urology;  Laterality: N/A;  55 seeds implanted    Social History   Socioeconomic History   Marital status: Married    Spouse name: Not on file   Number of children: Not on file   Years of education: Not on file   Highest education level: Not on file  Occupational History   Occupation: retired  Tobacco Use   Smoking status: Former    Packs/day: 1.00    Years: 15.00    Total pack years: 15.00    Types: Cigarettes    Quit date: 09/14/1965    Years since quitting: 56.3    Passive exposure: Past   Smokeless tobacco: Never  Vaping Use   Vaping Use: Never used  Substance and Sexual Activity   Alcohol use: Yes    Alcohol/week: 7.0 standard drinks of alcohol    Types: 7 Glasses of wine per week  Drug use: No   Sexual activity: Yes    Birth control/protection: None  Other Topics Concern   Not on file  Social History Narrative   Not on file   Social Determinants of Health   Financial Resource Strain: Low Risk  (12/05/2021)   Overall Financial Resource Strain (CARDIA)    Difficulty of Paying Living Expenses: Not hard at all  Food Insecurity: No Food Insecurity (12/05/2021)   Hunger Vital Sign    Worried About Running Out of Food in the Last Year: Never true    Ran Out of Food in the Last Year: Never true  Transportation Needs: No Transportation Needs (12/05/2021)   PRAPARE - Hydrologist (Medical): No    Lack of Transportation  (Non-Medical): No  Physical Activity: Insufficiently Active (12/05/2021)   Exercise Vital Sign    Days of Exercise per Week: 5 days    Minutes of Exercise per Session: 10 min  Stress: No Stress Concern Present (12/05/2021)   [place]    Feeling of Stress : Not at all  Social Connections: Moderately Isolated (12/05/2021)   Social Connection and Isolation Panel [NHANES]    Frequency of Communication with Friends and Family: More than three times a week    Frequency of Social Gatherings with Friends and Family: More than three times a week    Attends Religious Services: Never    Marine scientist or Organizations: No    Attends Archivist Meetings: Never    Marital Status: Married  Human resources officer Violence: Not At Risk (12/05/2021)   Humiliation, Afraid, Rape, and Kick questionnaire    Fear of Current or Ex-Partner: No    Emotionally Abused: No    Physically Abused: No    Sexually Abused: No    Family History  Problem Relation Age of Onset   Heart disease Brother    Heart attack Brother    Lung cancer Brother    Prostate cancer Neg Hx    Bladder Cancer Neg Hx    Kidney cancer Neg Hx      Current Outpatient Medications:    aspirin 81 MG chewable tablet, Chew 1 tablet (81 mg total) by mouth daily., Disp: , Rfl:    atorvastatin (LIPITOR) 80 MG tablet, Take 1 tablet (80 mg total) by mouth daily., Disp: 90 tablet, Rfl: 3   Cholecalciferol (VITAMIN D3) 50 MCG (2000 UT) TABS, Take 4,000 Units by mouth daily., Disp: , Rfl:    isosorbide mononitrate (IMDUR) 30 MG 24 hr tablet, Take 15 mg by mouth daily., Disp: , Rfl:    losartan (COZAAR) 25 MG tablet, Take 0.5 tablets (12.5 mg total) by mouth daily., Disp: 45 tablet, Rfl: 3   pantoprazole (PROTONIX) 40 MG tablet, Take 1 tablet (40 mg total) by mouth 2 (two) times daily before a meal., Disp: 60 tablet, Rfl: 0   tamsulosin (FLOMAX) 0.4 MG CAPS capsule, Take 2  capsules (0.8 mg total) by mouth daily after supper. Home med., Disp: , Rfl:    ticagrelor (BRILINTA) 90 MG TABS tablet, Take 1 tablet (90 mg total) by mouth 2 (two) times daily., Disp: 180 tablet, Rfl: 3   nitroGLYCERIN (NITROSTAT) 0.4 MG SL tablet, Place 0.4 mg under the tongue every 5 (five) minutes as needed for chest pain. (Patient not taking: Reported on 01/13/2022), Disp: , Rfl:    Relugolix 120 MG TABS, Take by mouth. Take 3 tablets (360  mg) by mouth as a loading dose on day 1, followed by 1 tablet (120 mg) once daily., Disp: , Rfl:   Physical exam:  Vitals:   01/13/22 0942  BP: (!) 128/58  Pulse: 98  Resp: 20  Temp: (!) 96.4 F (35.8 C)  SpO2: 99%  Weight: 195 lb 14.4 oz (88.9 kg)   Physical Exam Constitutional:      General: He is not in acute distress.    Comments: Ambulates with a cane   Cardiovascular:     Rate and Rhythm: Normal rate and regular rhythm.     Heart sounds: Normal heart sounds.  Pulmonary:     Effort: Pulmonary effort is normal.     Breath sounds: Normal breath sounds.  Abdominal:     General: Bowel sounds are normal.     Palpations: Abdomen is soft.  Skin:    General: Skin is warm and dry.  Neurological:     Mental Status: He is alert and oriented to person, place, and time.         Latest Ref Rng & Units 01/13/2022    9:14 AM  CMP  Glucose 70 - 99 mg/dL 103   BUN 8 - 23 mg/dL 31   Creatinine 0.61 - 1.24 mg/dL 1.27   Sodium 135 - 145 mmol/L 136   Potassium 3.5 - 5.1 mmol/L 5.1   Chloride 98 - 111 mmol/L 104   CO2 22 - 32 mmol/L 25   Calcium 8.9 - 10.3 mg/dL 8.7   Total Protein 6.5 - 8.1 g/dL 7.6   Total Bilirubin 0.3 - 1.2 mg/dL 0.7   Alkaline Phos 38 - 126 U/L 83   AST 15 - 41 U/L 21   ALT 0 - 44 U/L 24       Latest Ref Rng & Units 01/13/2022    9:14 AM  CBC  WBC 4.0 - 10.5 K/uL 6.3   Hemoglobin 13.0 - 17.0 g/dL 8.7   Hematocrit 39.0 - 52.0 % 28.0   Platelets 150 - 400 K/uL 336    [place] ASCITES (ABDOMEN LIMITED)  Result Date:  01/06/2022 CLINICAL DATA:  Abdominal distension question ascites EXAM: LIMITED ABDOMEN ULTRASOUND FOR ASCITES TECHNIQUE: Limited ultrasound survey for ascites was performed in all four abdominal quadrants. COMPARISON:  CT chest abdomen pelvis 04/02/2021 FINDINGS: Survey imaging of the 4 quadrants was performed. No ascites identified. IMPRESSION: No ascites. Electronically Signed   By: Lavonia Dana M.D.   On: 01/06/2022 14:44   US Abdomen Limited RUQ (LIVER/GB)  Result Date: 01/06/2022 CLINICAL DATA:  RIGHT upper quadrant pain EXAM: ULTRASOUND ABDOMEN LIMITED RIGHT UPPER QUADRANT COMPARISON:  CT chest abdomen pelvis 04/02/2021 FINDINGS: Gallbladder: Normally distended without stones or wall thickening. No pericholecystic fluid or sonographic Murphy sign. Common bile duct: Diameter: 3 mm, normal Liver: Normal echogenicity without mass or nodularity. No intrahepatic biliary dilatation. Portal vein is patent on color Doppler imaging with normal direction of blood flow towards the liver. Other: N/A IMPRESSION: Normal exam. Electronically Signed   By: Lavonia Dana M.D.   On: 01/06/2022 14:43   DG Chest 2 View  Result Date: 01/06/2022 CLINICAL DATA:  Weakness. EXAM: CHEST - 2 VIEW COMPARISON:  12/12/2021 FINDINGS: Cardiac enlargement. Aortic atherosclerotic calcifications. No signs of pleural effusion or edema. The lungs are hypoinflated. No airspace opacities. Visualized osseous structures appear intact. IMPRESSION: No active cardiopulmonary abnormalities. Electronically Signed   By: Kerby Moors M.D.   On: 01/06/2022 12:56     Assessment  and plan- Patient is a 88 y.o. male who is here for following issues:  Castrate sensitive metastatic prostate cancer: PSA is currently stable around 25.  Recent PSMA PET scan from April 2023 showed overall stable disease as compared to August 2022.  However in view of rising PSA I had recommended starting him on relugolix.  It was approved by insurance however he was not able  to start it as he was recently hospitalized.  We will plan to get that delivered to him ASAP and he will start taking it.  I will see him back in 1 month with labs  Normocytic anemia.  He has had extensive peripheral blood work-up as well as bone marrow biopsy which was unrevealing.  FISH for MDS was negative.  We again discussed trial of Retacrit but potential downsides including possible worsening of prostate cancer.  He would like to wait for second opinion at [facility] before starting Retacrit.  He has not heard back from [facility] about an appointment yet.   Visit Diagnosis 1. Prostate cancer ([place])   2. Normocytic anemia      Dr. Randa Evens, MD, MPH [facility] [phone] 01/13/2022 6:52 PM

## 2022-01-13 NOTE — Progress Notes (Signed)
[place]  [phone] [phone]  Patient Care Team: Owens Loffler, MD as PCP - General Leandrew Koyanagi, MD as Referring Physician (Ophthalmology) Nickie Retort, MD (Inactive) as Consulting Physician (Urology) Noreene Filbert, MD as Radiation Oncologist (Radiation Oncology)   Name of the patient: Cesar Powell  [id number]  Jan 13, 1931   Date of visit: 01/13/22  HPI: Patient is a 88 y.o. male with metastatic castration resistant prostate cancer. Patient had been receiving Lupron injections but is wanting to switch to an oral option, Orgovyx (relugolix).  Reason for Consult: Orgovyx (relugolix) oral chemotherapy education.   PAST MEDICAL HISTORY: Past Medical History:  Diagnosis Date   Coronary artery disease involving native coronary artery of native heart 08/25/2021   H/O prostate cancer    was treated with radiation   Hypertension    Local recurrence of prostate cancer ([place]) 06/21/2004   Qualifier: Diagnosis of  By: Fuller Plan CMA (AAMA), [place]     Myocardial infarction ([place]) 2022   Obstructive sleep apnea 06/22/2007   NPSG [place] 1979 Unattended Home sleep Study- 05/10/14- Confirms severe OSA, AHI 41.8/ hr, weight 230 pounds CPAP and also Transcend portable CPAP auto 8-15     Sleep apnea 1977   uses C-pap     HEMATOLOGY/ONCOLOGY HISTORY:  Oncology History   No history exists.    ALLERGIES:  has No Known Allergies.  MEDICATIONS:  Current Outpatient Medications  Medication Sig Dispense Refill   aspirin 81 MG chewable tablet Chew 1 tablet (81 mg total) by mouth daily.     atorvastatin (LIPITOR) 80 MG tablet Take 1 tablet (80 mg total) by mouth daily. 90 tablet 3   Cholecalciferol (VITAMIN D3) 50 MCG (2000 UT) TABS Take 4,000 Units by mouth daily.     isosorbide mononitrate (IMDUR) 30 MG 24 hr tablet Take 15 mg by mouth daily.     losartan (COZAAR) 25 MG tablet Take 0.5 tablets (12.5 mg  total) by mouth daily. 45 tablet 3   nitroGLYCERIN (NITROSTAT) 0.4 MG SL tablet Place 0.4 mg under the tongue every 5 (five) minutes as needed for chest pain. (Patient not taking: Reported on 01/13/2022)     pantoprazole (PROTONIX) 40 MG tablet Take 1 tablet (40 mg total) by mouth 2 (two) times daily before a meal. 60 tablet 0   tamsulosin (FLOMAX) 0.4 MG CAPS capsule Take 2 capsules (0.8 mg total) by mouth daily after supper. Home med.     ticagrelor (BRILINTA) 90 MG TABS tablet Take 1 tablet (90 mg total) by mouth 2 (two) times daily. 180 tablet 3   No current facility-administered medications for this visit.    VITAL SIGNS: There were no vitals taken for this visit. There were no vitals filed for this visit.  Estimated body mass index is 30.68 kg/m as calculated from the following:   Height as of 01/06/22: '5\' 7"'$  (1.702 m).   Weight as of an earlier encounter on 01/13/22: 88.9 kg (195 lb 14.4 oz).  LABS: CBC:    Component Value Date/Time   WBC 6.3 01/13/2022 0914   HGB 8.7 (L) 01/13/2022 0914   HGB 8.1 (L) 06/06/2021 1447   HCT 28.0 (L) 01/13/2022 0914   HCT 25.5 (L) 06/06/2021 1447   PLT 336 01/13/2022 0914   PLT 260 06/06/2021 1447   MCV 91.2 01/13/2022 0914   MCV 89 06/06/2021 1447   MCV 96 03/08/2012 1641   NEUTROABS 5.1 01/13/2022 0914   NEUTROABS 9.2 (H)  03/22/2021 1557   LYMPHSABS 0.6 (L) 01/13/2022 0914   LYMPHSABS 0.6 (L) 03/22/2021 1557   MONOABS 0.6 01/13/2022 0914   EOSABS 0.1 01/13/2022 0914   EOSABS 0.1 03/22/2021 1557   BASOSABS 0.0 01/13/2022 0914   BASOSABS 0.0 03/22/2021 1557   Comprehensive Metabolic Panel:    Component Value Date/Time   NA 136 01/13/2022 0914   NA 137 03/22/2021 1557   NA 137 03/08/2012 1641   K 5.1 01/13/2022 0914   K 5.8 (H) 03/08/2012 1641   CL 104 01/13/2022 0914   CL 103 03/08/2012 1641   CO2 25 01/13/2022 0914   CO2 25 03/08/2012 1641   BUN 31 (H) 01/13/2022 0914   BUN 19 03/22/2021 1557   BUN 13 03/08/2012 1641    CREATININE 1.27 (H) 01/13/2022 0914   CREATININE 0.84 03/08/2012 1641   GLUCOSE 103 (H) 01/13/2022 0914   GLUCOSE 97 03/08/2012 1641   CALCIUM 8.7 (L) 01/13/2022 0914   CALCIUM 9.0 03/08/2012 1641   AST 21 01/13/2022 0914   AST 62 (H) 03/08/2012 1641   ALT 24 01/13/2022 0914   ALT 36 03/08/2012 1641   ALKPHOS 83 01/13/2022 0914   ALKPHOS 51 03/08/2012 1641   BILITOT 0.7 01/13/2022 0914   BILITOT 0.2 03/22/2021 1557   BILITOT 0.5 03/08/2012 1641   PROT 7.6 01/13/2022 0914   PROT 6.1 03/22/2021 1557   PROT 8.1 03/08/2012 1641   ALBUMIN 3.2 (L) 01/13/2022 0914   ALBUMIN 3.7 03/22/2021 1557   ALBUMIN 4.2 03/08/2012 1641     Present during today's visit: Patient and his wife.  Start plan: Relugolix will be delivered today, patient will decide if he wants to start today (01/13/22) or tomorrow (01/14/22).   Patient Education I spoke with patient for overview of new oral chemotherapy medication: relugolix   Administration: Counseled patient on administration, dosing, side effects, monitoring, drug-food interactions, safe handling, storage, and disposal. Patient will take 3 tablets (360 mg) by mouth as a loading dose on day 1, followed by 1 tablet (120 mg) once daily.  Side Effects: Side effects include but not limited to: hot flashes, fatigue, muscle pain.    Drug-drug Interactions (DDI): No current DDIs with medication  Adherence: After discussion with patient no patient barriers to medication adherence identified.  Reviewed with patient importance of keeping a medication schedule and plan for any missed doses.  Mr. Carstarphen voiced understanding and appreciation. All questions answered. Medication handout provided.  Provided patient with [facility] phone number. Patient knows to call the office with questions or concerns. [facility] will continue to follow.  Patient expressed understanding and was in agreement with this plan. He  also understands that He can call clinic at any time with any questions, concerns, or complaints.   Medication Access Issues: No issues. Patient approved for manufacturer assistance, medication will be delivered today.  Follow-up plan: RTC 1 month  Thank you for allowing me to participate in the care of this patient.   Time Total: 20 minutes.  Visit consisted of counseling and education on dealing with issues of symptom management in the setting of serious and potentially life-threatening illness.Greater than 50%  of this time was spent counseling and coordinating care related to the above assessment and plan.  Signed by: Darl Pikes, PharmD, BCPS, Salley Slaughter, CPP Hematology/Oncology Clinical Pharmacist Practitioner [place]/DB/AP [facility] [phone]  01/13/2022 10:42 AM

## 2022-01-13 NOTE — Progress Notes (Signed)
Pt states he has noticed that he gets tired very easily.

## 2022-01-14 ENCOUNTER — Ambulatory Visit (INDEPENDENT_AMBULATORY_CARE_PROVIDER_SITE_OTHER): Payer: Medicare Other | Admitting: Family Medicine

## 2022-01-14 ENCOUNTER — Inpatient Hospital Stay: Payer: Medicare Other

## 2022-01-14 VITALS — BP 100/58 | HR 85 | Temp 98.0°F | Ht 67.5 in | Wt 200.1 lb

## 2022-01-14 DIAGNOSIS — I2510 Atherosclerotic heart disease of native coronary artery without angina pectoris: Secondary | ICD-10-CM

## 2022-01-14 DIAGNOSIS — N3001 Acute cystitis with hematuria: Secondary | ICD-10-CM | POA: Diagnosis not present

## 2022-01-14 DIAGNOSIS — C61 Malignant neoplasm of prostate: Secondary | ICD-10-CM

## 2022-01-14 DIAGNOSIS — D649 Anemia, unspecified: Secondary | ICD-10-CM | POA: Diagnosis not present

## 2022-01-14 NOTE — Progress Notes (Signed)
Elizabeht Suto T. Tyeesha Riker, MD, [place] at [facility] [place], [address]  Phone: [phone]  FAX: [phone]  Cesar Powell - 88 y.o. male  MRN [id number]  Date of Birth: [birth year]  Date: 01/14/2022  PCP: Owens Loffler, MD  Referral: Owens Loffler, MD  Chief Complaint  Patient presents with   Hospitalization Follow-up   Subjective:   Cesar Powell is a 88 y.o. very pleasant male patient with Body mass index is 30.87 kg/m. who presents with the following:  Admit date: 01/06/2022 Discharge date: 01/09/2022  UTI with IV Rocephin.  6/13 Hgb is 8.7 Cr 1.27 -He is feeling very fatigued all the time.  He is reluctant to start any new medication, worried about potential risk of worsening his metastatic prostate cancer.  Driving, cooking, taking care of himself.  Manages things reasonably well at home.  3 units of PRBCs in the hospital.   Now leaking at night. Prior was having to self-cath.  This was causing repeated UTI and he previously was having repeated urinary retention.  Metastatic prostate cancer.  This has been a challenge and progressive over time.  He is currently still seeing oncology as well.  Patient did want a second opinion and per his report hematology also wanted to have [facility] for an opinion, but chart review looks as if this is not happen and they are going to really consult and refax as a January 15, 2022.    Review of Systems is noted in the HPI, as appropriate  Objective:   BP (!) 100/58   Pulse 85   Temp 98 F (36.7 C) (Oral)   Ht 5' 7.5" (1.715 m)   Wt 200 lb 1 oz (90.7 kg)   SpO2 97%   BMI 30.87 kg/m   GEN: No acute distress; alert,appropriate. PULM: Breathing comfortably in no respiratory distress PSYCH: Normally interactive.  CV: RRR, no m/g/r   Laboratory and Imaging Data: Results for orders placed or performed in visit on 01/13/22  PSA  Result Value Ref Range    Prostatic Specific Antigen 24.04 (H) 0.00 - 4.00 ng/mL  Comprehensive metabolic panel  Result Value Ref Range   Sodium 136 135 - 145 mmol/L   Potassium 5.1 3.5 - 5.1 mmol/L   Chloride 104 98 - 111 mmol/L   CO2 25 22 - 32 mmol/L   Glucose, Bld 103 (H) 70 - 99 mg/dL   BUN 31 (H) 8 - 23 mg/dL   Creatinine, Ser 1.27 (H) 0.61 - 1.24 mg/dL   Calcium 8.7 (L) 8.9 - 10.3 mg/dL   Total Protein 7.6 6.5 - 8.1 g/dL   Albumin 3.2 (L) 3.5 - 5.0 g/dL   AST 21 15 - 41 U/L   ALT 24 0 - 44 U/L   Alkaline Phosphatase 83 38 - 126 U/L   Total Bilirubin 0.7 0.3 - 1.2 mg/dL   GFR, Estimated 54 (L) >60 mL/min   Anion gap 7 5 - 15  CBC with Differential/Platelet  Result Value Ref Range   WBC 6.3 4.0 - 10.5 K/uL   RBC 3.07 (L) 4.22 - 5.81 MIL/uL   Hemoglobin 8.7 (L) 13.0 - 17.0 g/dL   HCT 28.0 (L) 39.0 - 52.0 %   MCV 91.2 80.0 - 100.0 fL   MCH 28.3 26.0 - 34.0 pg   MCHC 31.1 30.0 - 36.0 g/dL   RDW 15.7 (H) 11.5 - 15.5 %   Platelets 336 150 -  400 K/uL   nRBC 0.0 0.0 - 0.2 %   Neutrophils Relative % 79 %   Neutro Abs 5.1 1.7 - 7.7 K/uL   Lymphocytes Relative 9 %   Lymphs Abs 0.6 (L) 0.7 - 4.0 K/uL   Monocytes Relative 9 %   Monocytes Absolute 0.6 0.1 - 1.0 K/uL   Eosinophils Relative 2 %   Eosinophils Absolute 0.1 0.0 - 0.5 K/uL   Basophils Relative 0 %   Basophils Absolute 0.0 0.0 - 0.1 K/uL   Immature Granulocytes 1 %   Abs Immature Granulocytes 0.04 0.00 - 0.07 K/uL  Sample to Blood Bank  Result Value Ref Range   Blood Bank Specimen SAMPLE AVAILABLE FOR TESTING    Sample Expiration      01/16/2022,2359 Performed at [facility], [address], [place], [place] [address]      Assessment and Plan:     ICD-10-CM   1. Acute cystitis with hematuria  N30.01     2. Prostate cancer ([place])  C61     3. Coronary artery disease involving native coronary artery of native heart without angina pectoris  I25.10     4. Normocytic anemia  D64.9     5. Symptomatic anemia  D64.9       UTI requiring hospitalization in the setting of recurrent UTI with repeated bouts of urinary retention.  He has had catheterize repeatedly recently, now he is having some almost continual leakage and he is wearing a depends.  Very symptomatic anemia, normocytic.  He had 3 units of packed red blood cells in the hospital and his hemoglobin is still 8.  Metastatic prostate cancer. Oncology is starting Relugolix.  They have also been considering Retacrit.  He is been concerned about the risk-benefit ratio with potential impact to his prostate cancer and is hoping to have a second opinion at [facility].  Hematology is following.  Medication Management during today's office visit: No orders of the defined types were placed in this encounter.  Medications Discontinued During This Encounter  Medication Reason   tamsulosin (FLOMAX) 0.4 MG CAPS capsule Duplicate    Orders placed today for conditions managed today: No orders of the defined types were placed in this encounter.   Follow-up if needed: No follow-ups on file.  Dragon [employer] One speech-to-text software was used for transcription in this dictation.  Possible transcriptional errors can occur using Editor, commissioning.   Signed,  Maud Deed. Destine Ambroise, MD   Outpatient Encounter Medications as of 01/14/2022  Medication Sig   aspirin 81 MG chewable tablet Chew 1 tablet (81 mg total) by mouth daily.   atorvastatin (LIPITOR) 80 MG tablet Take 1 tablet (80 mg total) by mouth daily.   Cholecalciferol (VITAMIN D3) 50 MCG (2000 UT) TABS Take 4,000 Units by mouth daily.   isosorbide mononitrate (IMDUR) 30 MG 24 hr tablet Take 15 mg by mouth daily.   losartan (COZAAR) 25 MG tablet Take 0.5 tablets (12.5 mg total) by mouth daily.   nitroGLYCERIN (NITROSTAT) 0.4 MG SL tablet Place 0.4 mg under the tongue every 5 (five) minutes as needed for chest pain.   pantoprazole (PROTONIX) 40 MG tablet Take 1 tablet (40 mg total) by mouth 2 (two) times daily  before a meal.   Relugolix 120 MG TABS Take by mouth. Take 3 tablets (360 mg) by mouth as a loading dose on day 1, followed by 1 tablet (120 mg) once daily.   tamsulosin (FLOMAX) 0.4 MG CAPS capsule Take 0.4  mg by mouth daily after supper.   ticagrelor (BRILINTA) 90 MG TABS tablet Take 1 tablet (90 mg total) by mouth 2 (two) times daily.   [DISCONTINUED] tamsulosin (FLOMAX) 0.4 MG CAPS capsule Take 2 capsules (0.8 mg total) by mouth daily after supper. Home med. (Patient taking differently: Take 0.4 mg by mouth daily after supper. Home med.)   No facility-administered encounter medications on file as of 01/14/2022.

## 2022-01-15 ENCOUNTER — Telehealth: Payer: Self-pay

## 2022-01-15 NOTE — Telephone Encounter (Signed)
Reached out to [facility] in regards to pts referral for a second anemia (anemia). Per Santiago Glad, referral was not recieved even with a fax confirmation. Let Santiago Glad know I will go ahead and re-fax paperwork. Will call later on to make sure they have recieved so pt can be schduled.

## 2022-01-16 DIAGNOSIS — Z9181 History of falling: Secondary | ICD-10-CM | POA: Diagnosis not present

## 2022-01-16 DIAGNOSIS — N390 Urinary tract infection, site not specified: Secondary | ICD-10-CM | POA: Diagnosis not present

## 2022-01-16 DIAGNOSIS — R55 Syncope and collapse: Secondary | ICD-10-CM | POA: Diagnosis not present

## 2022-01-16 DIAGNOSIS — R531 Weakness: Secondary | ICD-10-CM | POA: Diagnosis not present

## 2022-01-16 DIAGNOSIS — S0003XD Contusion of scalp, subsequent encounter: Secondary | ICD-10-CM | POA: Diagnosis not present

## 2022-01-16 DIAGNOSIS — C61 Malignant neoplasm of prostate: Secondary | ICD-10-CM | POA: Diagnosis not present

## 2022-01-17 ENCOUNTER — Encounter: Payer: Self-pay | Admitting: Family Medicine

## 2022-01-19 DIAGNOSIS — M5136 Other intervertebral disc degeneration, lumbar region: Secondary | ICD-10-CM | POA: Diagnosis not present

## 2022-01-19 DIAGNOSIS — M9905 Segmental and somatic dysfunction of pelvic region: Secondary | ICD-10-CM | POA: Diagnosis not present

## 2022-01-19 DIAGNOSIS — M9903 Segmental and somatic dysfunction of lumbar region: Secondary | ICD-10-CM | POA: Diagnosis not present

## 2022-01-19 DIAGNOSIS — M62830 Muscle spasm of back: Secondary | ICD-10-CM | POA: Diagnosis not present

## 2022-01-21 ENCOUNTER — Other Ambulatory Visit: Payer: Self-pay | Admitting: *Deleted

## 2022-01-22 DIAGNOSIS — M62830 Muscle spasm of back: Secondary | ICD-10-CM | POA: Diagnosis not present

## 2022-01-22 DIAGNOSIS — M9903 Segmental and somatic dysfunction of lumbar region: Secondary | ICD-10-CM | POA: Diagnosis not present

## 2022-01-22 DIAGNOSIS — M9905 Segmental and somatic dysfunction of pelvic region: Secondary | ICD-10-CM | POA: Diagnosis not present

## 2022-01-22 DIAGNOSIS — M5136 Other intervertebral disc degeneration, lumbar region: Secondary | ICD-10-CM | POA: Diagnosis not present

## 2022-01-23 DIAGNOSIS — Z9181 History of falling: Secondary | ICD-10-CM | POA: Diagnosis not present

## 2022-01-23 DIAGNOSIS — R55 Syncope and collapse: Secondary | ICD-10-CM | POA: Diagnosis not present

## 2022-01-23 DIAGNOSIS — C61 Malignant neoplasm of prostate: Secondary | ICD-10-CM | POA: Diagnosis not present

## 2022-01-23 DIAGNOSIS — N390 Urinary tract infection, site not specified: Secondary | ICD-10-CM | POA: Diagnosis not present

## 2022-01-23 DIAGNOSIS — R531 Weakness: Secondary | ICD-10-CM | POA: Diagnosis not present

## 2022-01-23 DIAGNOSIS — S0003XD Contusion of scalp, subsequent encounter: Secondary | ICD-10-CM | POA: Diagnosis not present

## 2022-01-26 ENCOUNTER — Encounter: Payer: Self-pay | Admitting: Emergency Medicine

## 2022-01-27 DIAGNOSIS — M9905 Segmental and somatic dysfunction of pelvic region: Secondary | ICD-10-CM | POA: Diagnosis not present

## 2022-01-27 DIAGNOSIS — M5136 Other intervertebral disc degeneration, lumbar region: Secondary | ICD-10-CM | POA: Diagnosis not present

## 2022-01-27 DIAGNOSIS — M9903 Segmental and somatic dysfunction of lumbar region: Secondary | ICD-10-CM | POA: Diagnosis not present

## 2022-01-27 DIAGNOSIS — M62830 Muscle spasm of back: Secondary | ICD-10-CM | POA: Diagnosis not present

## 2022-01-28 ENCOUNTER — Telehealth: Payer: Self-pay | Admitting: *Deleted

## 2022-01-28 NOTE — Telephone Encounter (Signed)
RN called and spoke with Cesar Powell at [facility] regarding if patient had set up appointment with their clinic.  Information received that appointment was made by patient and scheduled for May 11, 2022 at 4 pm.

## 2022-01-29 DIAGNOSIS — M62830 Muscle spasm of back: Secondary | ICD-10-CM | POA: Diagnosis not present

## 2022-01-29 DIAGNOSIS — Z9181 History of falling: Secondary | ICD-10-CM | POA: Diagnosis not present

## 2022-01-29 DIAGNOSIS — R531 Weakness: Secondary | ICD-10-CM | POA: Diagnosis not present

## 2022-01-29 DIAGNOSIS — M9903 Segmental and somatic dysfunction of lumbar region: Secondary | ICD-10-CM | POA: Diagnosis not present

## 2022-01-29 DIAGNOSIS — M9905 Segmental and somatic dysfunction of pelvic region: Secondary | ICD-10-CM | POA: Diagnosis not present

## 2022-01-29 DIAGNOSIS — N390 Urinary tract infection, site not specified: Secondary | ICD-10-CM | POA: Diagnosis not present

## 2022-01-29 DIAGNOSIS — S0003XD Contusion of scalp, subsequent encounter: Secondary | ICD-10-CM | POA: Diagnosis not present

## 2022-01-29 DIAGNOSIS — M5136 Other intervertebral disc degeneration, lumbar region: Secondary | ICD-10-CM | POA: Diagnosis not present

## 2022-01-29 DIAGNOSIS — C61 Malignant neoplasm of prostate: Secondary | ICD-10-CM | POA: Diagnosis not present

## 2022-01-29 DIAGNOSIS — R55 Syncope and collapse: Secondary | ICD-10-CM | POA: Diagnosis not present

## 2022-01-30 ENCOUNTER — Ambulatory Visit
Admission: RE | Admit: 2022-01-30 | Discharge: 2022-01-30 | Disposition: A | Discharge status: Home / Self Care | Payer: Medicare Other | Source: Ambulatory Visit | Attending: Radiation Oncology | Admitting: Radiation Oncology

## 2022-01-30 VITALS — BP 164/61 | HR 55 | Temp 97.0°F | Resp 18 | Ht 67.5 in | Wt 195.8 lb

## 2022-01-30 DIAGNOSIS — C61 Malignant neoplasm of prostate: Secondary | ICD-10-CM

## 2022-02-02 DIAGNOSIS — M9903 Segmental and somatic dysfunction of lumbar region: Secondary | ICD-10-CM | POA: Diagnosis not present

## 2022-02-02 DIAGNOSIS — M5136 Other intervertebral disc degeneration, lumbar region: Secondary | ICD-10-CM | POA: Diagnosis not present

## 2022-02-02 DIAGNOSIS — M9905 Segmental and somatic dysfunction of pelvic region: Secondary | ICD-10-CM | POA: Diagnosis not present

## 2022-02-02 DIAGNOSIS — M62830 Muscle spasm of back: Secondary | ICD-10-CM | POA: Diagnosis not present

## 2022-02-05 DIAGNOSIS — M5136 Other intervertebral disc degeneration, lumbar region: Secondary | ICD-10-CM | POA: Diagnosis not present

## 2022-02-05 DIAGNOSIS — M62830 Muscle spasm of back: Secondary | ICD-10-CM | POA: Diagnosis not present

## 2022-02-05 DIAGNOSIS — M9905 Segmental and somatic dysfunction of pelvic region: Secondary | ICD-10-CM | POA: Diagnosis not present

## 2022-02-05 DIAGNOSIS — M9903 Segmental and somatic dysfunction of lumbar region: Secondary | ICD-10-CM | POA: Diagnosis not present

## 2022-02-06 DIAGNOSIS — R55 Syncope and collapse: Secondary | ICD-10-CM | POA: Diagnosis not present

## 2022-02-06 DIAGNOSIS — N390 Urinary tract infection, site not specified: Secondary | ICD-10-CM | POA: Diagnosis not present

## 2022-02-06 DIAGNOSIS — C61 Malignant neoplasm of prostate: Secondary | ICD-10-CM | POA: Diagnosis not present

## 2022-02-06 DIAGNOSIS — R531 Weakness: Secondary | ICD-10-CM | POA: Diagnosis not present

## 2022-02-06 DIAGNOSIS — Z9181 History of falling: Secondary | ICD-10-CM | POA: Diagnosis not present

## 2022-02-06 DIAGNOSIS — S0003XD Contusion of scalp, subsequent encounter: Secondary | ICD-10-CM | POA: Diagnosis not present

## 2022-02-09 DIAGNOSIS — I2510 Atherosclerotic heart disease of native coronary artery without angina pectoris: Secondary | ICD-10-CM | POA: Diagnosis not present

## 2022-02-09 DIAGNOSIS — R339 Retention of urine, unspecified: Secondary | ICD-10-CM

## 2022-02-09 DIAGNOSIS — E8881 Metabolic syndrome: Secondary | ICD-10-CM

## 2022-02-09 DIAGNOSIS — S0003XD Contusion of scalp, subsequent encounter: Secondary | ICD-10-CM | POA: Diagnosis not present

## 2022-02-09 DIAGNOSIS — R531 Weakness: Secondary | ICD-10-CM | POA: Diagnosis not present

## 2022-02-09 DIAGNOSIS — Z9181 History of falling: Secondary | ICD-10-CM | POA: Diagnosis not present

## 2022-02-09 DIAGNOSIS — C61 Malignant neoplasm of prostate: Secondary | ICD-10-CM | POA: Diagnosis not present

## 2022-02-09 DIAGNOSIS — D631 Anemia in chronic kidney disease: Secondary | ICD-10-CM | POA: Diagnosis not present

## 2022-02-09 DIAGNOSIS — I129 Hypertensive chronic kidney disease with stage 1 through stage 4 chronic kidney disease, or unspecified chronic kidney disease: Secondary | ICD-10-CM | POA: Diagnosis not present

## 2022-02-09 DIAGNOSIS — K5790 Diverticulosis of intestine, part unspecified, without perforation or abscess without bleeding: Secondary | ICD-10-CM

## 2022-02-09 DIAGNOSIS — Z7901 Long term (current) use of anticoagulants: Secondary | ICD-10-CM

## 2022-02-09 DIAGNOSIS — D630 Anemia in neoplastic disease: Secondary | ICD-10-CM | POA: Diagnosis not present

## 2022-02-09 DIAGNOSIS — I252 Old myocardial infarction: Secondary | ICD-10-CM

## 2022-02-09 DIAGNOSIS — G4733 Obstructive sleep apnea (adult) (pediatric): Secondary | ICD-10-CM | POA: Diagnosis not present

## 2022-02-09 DIAGNOSIS — N182 Chronic kidney disease, stage 2 (mild): Secondary | ICD-10-CM | POA: Diagnosis not present

## 2022-02-09 DIAGNOSIS — N390 Urinary tract infection, site not specified: Secondary | ICD-10-CM | POA: Diagnosis not present

## 2022-02-09 DIAGNOSIS — R55 Syncope and collapse: Secondary | ICD-10-CM | POA: Diagnosis not present

## 2022-02-10 DIAGNOSIS — M62830 Muscle spasm of back: Secondary | ICD-10-CM | POA: Diagnosis not present

## 2022-02-10 DIAGNOSIS — M5136 Other intervertebral disc degeneration, lumbar region: Secondary | ICD-10-CM | POA: Diagnosis not present

## 2022-02-10 DIAGNOSIS — M9905 Segmental and somatic dysfunction of pelvic region: Secondary | ICD-10-CM | POA: Diagnosis not present

## 2022-02-10 DIAGNOSIS — M9903 Segmental and somatic dysfunction of lumbar region: Secondary | ICD-10-CM | POA: Diagnosis not present

## 2022-02-16 ENCOUNTER — Inpatient Hospital Stay: Payer: Medicare Other | Attending: Oncology

## 2022-02-16 ENCOUNTER — Inpatient Hospital Stay (HOSPITAL_BASED_OUTPATIENT_CLINIC_OR_DEPARTMENT_OTHER): Payer: Medicare Other | Admitting: Oncology

## 2022-02-16 ENCOUNTER — Encounter: Payer: Self-pay | Admitting: Oncology

## 2022-02-16 ENCOUNTER — Other Ambulatory Visit: Payer: Self-pay

## 2022-02-16 DIAGNOSIS — D649 Anemia, unspecified: Secondary | ICD-10-CM

## 2022-02-16 DIAGNOSIS — Z87891 Personal history of nicotine dependence: Secondary | ICD-10-CM | POA: Diagnosis not present

## 2022-02-16 DIAGNOSIS — Z79899 Other long term (current) drug therapy: Secondary | ICD-10-CM | POA: Diagnosis not present

## 2022-02-16 DIAGNOSIS — C61 Malignant neoplasm of prostate: Secondary | ICD-10-CM

## 2022-02-16 LAB — CBC WITH DIFFERENTIAL/PLATELET
Abs Immature Granulocytes: 0.02 10*3/uL (ref 0.00–0.07)
Basophils Absolute: 0 10*3/uL (ref 0.0–0.1)
Basophils Relative: 0 %
Eosinophils Absolute: 0.2 10*3/uL (ref 0.0–0.5)
Eosinophils Relative: 3 %
HCT: 27.3 % — ABNORMAL LOW (ref 39.0–52.0)
Hemoglobin: 8.5 g/dL — ABNORMAL LOW (ref 13.0–17.0)
Immature Granulocytes: 0 %
Lymphocytes Relative: 9 %
Lymphs Abs: 0.6 10*3/uL — ABNORMAL LOW (ref 0.7–4.0)
MCH: 29.1 pg (ref 26.0–34.0)
MCHC: 31.1 g/dL (ref 30.0–36.0)
MCV: 93.5 fL (ref 80.0–100.0)
Monocytes Absolute: 0.6 10*3/uL (ref 0.1–1.0)
Monocytes Relative: 9 %
Neutro Abs: 5 10*3/uL (ref 1.7–7.7)
Neutrophils Relative %: 79 %
Platelets: 332 10*3/uL (ref 150–400)
RBC: 2.92 MIL/uL — ABNORMAL LOW (ref 4.22–5.81)
RDW: 16.6 % — ABNORMAL HIGH (ref 11.5–15.5)
WBC: 6.4 10*3/uL (ref 4.0–10.5)
nRBC: 0 % (ref 0.0–0.2)

## 2022-02-16 LAB — COMPREHENSIVE METABOLIC PANEL
ALT: 14 U/L (ref 0–44)
AST: 20 U/L (ref 15–41)
Albumin: 3.2 g/dL — ABNORMAL LOW (ref 3.5–5.0)
Alkaline Phosphatase: 62 U/L (ref 38–126)
Anion gap: 3 — ABNORMAL LOW (ref 5–15)
BUN: 30 mg/dL — ABNORMAL HIGH (ref 8–23)
CO2: 26 mmol/L (ref 22–32)
Calcium: 8.4 mg/dL — ABNORMAL LOW (ref 8.9–10.3)
Chloride: 108 mmol/L (ref 98–111)
Creatinine, Ser: 1.17 mg/dL (ref 0.61–1.24)
GFR, Estimated: 60 mL/min — ABNORMAL LOW (ref >60)
Glucose, Bld: 111 mg/dL — ABNORMAL HIGH (ref 70–99)
Potassium: 4.1 mmol/L (ref 3.5–5.1)
Sodium: 137 mmol/L (ref 135–145)
Total Bilirubin: 0.7 mg/dL (ref 0.3–1.2)
Total Protein: 7.2 g/dL (ref 6.5–8.1)

## 2022-02-16 LAB — IRON AND TIBC
Iron: 34 ug/dL — ABNORMAL LOW (ref 45–182)
Saturation Ratios: 15 % — ABNORMAL LOW (ref 17.9–39.5)
TIBC: 227 ug/dL — ABNORMAL LOW (ref 250–450)
UIBC: 193 ug/dL

## 2022-02-16 LAB — FERRITIN: Ferritin: 587 ng/mL — ABNORMAL HIGH (ref 24–336)

## 2022-02-16 LAB — PSA: Prostatic Specific Antigen: 19.86 ng/mL — ABNORMAL HIGH (ref 0.00–4.00)

## 2022-02-16 NOTE — Progress Notes (Signed)
Hematology/Oncology Consult note [facility]  Telephone:([phone] [phone]  Patient Care Team: Owens Loffler, MD as PCP - General Leandrew Koyanagi, MD as Referring Physician (Ophthalmology) Nickie Retort, MD (Inactive) as Consulting Physician (Urology) Noreene Filbert, MD as Radiation Oncologist (Radiation Oncology)   Name of the patient: Cesar Powell  [id number]  1931/07/09   Date of visit: 02/16/22  Diagnosis- castrate resistant metastatic prostate cancer  Chief complaint/ Reason for visit-routine follow-up of prostate cancer and anemia  Heme/Onc history: patient is a 88 year old gentleman with no significant past medical problems.  He has a history of prostate cancer that was diagnosed and treated with radiation in 2005.  At that point he his PSA was about 0.03 for a long time.  Then started on Axiron for low testosterone.  PSA eventually went up to as high as 18 2015.  At that point he was started on Lupron his PSA doubling time is around 10 months. Most recently since October 2018 after his PSA went down from 3.6-1.7 and has been gradually increasing to 1.3,1.5 and 2 indicating a PSA doubling time of 9.9 months.     patient  seen by me in September 2019 discussed adding oral antiandrogens like apalutamide versus enzalutamide to Lupron in castrate resistant nonmetastatic prostate cancer.  After discussing risks and benefits patient did not wish to proceed.Patient stopped lupron in feb 2020 but did restart after 1 year in February 2021.  Patient received IMRT for his pelvic lymph nodes.  Patient was started on relugolix in June 2023   Patient has had significant anemia with a hemoglobin that has fluctuated between 8-9 since August 2022.  Prior to that his hemoglobin was stable between 11-12.Results of anemia work-up in the past have been as follows: Ferritin levels elevated in the 400s.  TIBC low at 225 with an iron saturation of 12%.   ANA comprehensive panel negative.  TSH and folate normal.  LDH normal.  Haptoglobin elevated at 427.  B12 levels normal.  Patient had a bone marrow biopsy given his significant anemia which shows slightly hypercellular marrow with myeloid hyperplasia but no evidence of leukemia lymphoma or high-grade dysplasia.  Erythroid precursors were decreased in numbers with occasional atypia.  Granulocytic precursors no overt dysplasia and megakaryocytes quantitatively and qualitatively unremarkable  Interval history-patient is tolerating relugolix well without any significant side effects but he has not called to get his next prescription.  He ran out of his prescription yesterday.  He has baseline fatigue.  No recent falls  ECOG PS- 12 Pain scale- 0   Review of systems- Review of Systems  Constitutional:  Positive for malaise/fatigue. Negative for chills, fever and weight loss.  HENT:  Negative for congestion, ear discharge and nosebleeds.   Eyes:  Negative for blurred vision.  Respiratory:  Negative for cough, hemoptysis, sputum production, shortness of breath and wheezing.   Cardiovascular:  Negative for chest pain, palpitations, orthopnea and claudication.  Gastrointestinal:  Negative for abdominal pain, blood in stool, constipation, diarrhea, heartburn, melena, nausea and vomiting.  Genitourinary:  Negative for dysuria, flank pain, frequency, hematuria and urgency.  Musculoskeletal:  Negative for back pain, joint pain and myalgias.  Skin:  Negative for rash.  Neurological:  Negative for dizziness, tingling, focal weakness, seizures, weakness and headaches.  Endo/Heme/Allergies:  Does not bruise/bleed easily.  Psychiatric/Behavioral:  Negative for depression and suicidal ideas. The patient does not have insomnia.       No Known Allergies  Past Medical History:  Diagnosis Date   Coronary artery disease involving native coronary artery of native heart 08/25/2021   H/O prostate cancer    was  treated with radiation   Hypertension    Local recurrence of prostate cancer ([place]) 06/21/2004   Qualifier: Diagnosis of  By: Fuller Plan CMA (AAMA), [place]     Myocardial infarction ([place]) 2022   Obstructive sleep apnea 06/22/2007   NPSG [place] 1979 Unattended Home sleep Study- 05/10/14- Confirms severe OSA, AHI 41.8/ hr, weight 230 pounds CPAP and also Transcend portable CPAP auto 8-15     Sleep apnea 1977   uses C-pap      Past Surgical History:  Procedure Laterality Date   ABDOMINAL SURGERY     ruptured intestines    CATARACT EXTRACTION W/PHACO Left 01/29/2016   Procedure: CATARACT EXTRACTION PHACO AND INTRAOCULAR LENS PLACEMENT ([place]) left eye;  Surgeon: Leandrew Koyanagi, MD;  Location: [place];  Service: Ophthalmology;  Laterality: Left;  RESTOR LENS   CATARACT EXTRACTION W/PHACO Right 10/21/2016   Procedure: CATARACT EXTRACTION PHACO AND INTRAOCULAR LENS PLACEMENT ([facility])  Right restor toric lens;  Surgeon: Leandrew Koyanagi, MD;  Location: [place];  Service: Ophthalmology;  Laterality: Right;  restor toric lens   CORONARY/GRAFT ACUTE MI REVASCULARIZATION N/A 05/13/2021   Procedure: Coronary/Graft Acute MI Revascularization;  Surgeon: Nelva Bush, MD;  Location: [place] [facility];  Service: Cardiovascular;  Laterality: N/A;   CYSTOSCOPY WITH LITHOLAPAXY N/A 08/14/2016   Procedure: CYSTOSCOPY WITH LITHOLAPAXY;  Surgeon: Nickie Retort, MD;  Location: [facility];  Service: Urology;  Laterality: N/A;   FEMUR IM NAIL Right 10/08/2014   Procedure: INTRAMEDULLARY (IM) NAIL FEMORAL;  Surgeon: Rod Can, MD;  Location: [place];  Service: Orthopedics;  Laterality: Right;  PER DANIELLE 110 MIN   HOLMIUM LASER APPLICATION  08/21/4172   Procedure: HOLMIUM LASER APPLICATION;  Surgeon: Nickie Retort, MD;  Location: [facility];  Service: Urology;;   LEFT HEART CATH AND CORONARY ANGIOGRAPHY N/A 05/13/2021   Procedure: LEFT HEART CATH AND CORONARY ANGIOGRAPHY;   Surgeon: Nelva Bush, MD;  Location: [place] [facility];  Service: Cardiovascular;  Laterality: N/A;   OTHER SURGICAL HISTORY  2006   Prostate Radiation Surgery    RADIOACTIVE SEED IMPLANT N/A 10/30/2019   Procedure: RADIOACTIVE SEED IMPLANT/BRACHYTHERAPY IMPLANT;  Surgeon: Billey Co, MD;  Location: [facility];  Service: Urology;  Laterality: N/A;  32 seeds implanted    Social History   Socioeconomic History   Marital status: Married    Spouse name: Not on file   Number of children: Not on file   Years of education: Not on file   Highest education level: Not on file  Occupational History   Occupation: retired  Tobacco Use   Smoking status: Former    Packs/day: 1.00    Years: 15.00    Total pack years: 15.00    Types: Cigarettes    Quit date: 09/14/1965    Years since quitting: 56.4    Passive exposure: Past   Smokeless tobacco: Never  Vaping Use   Vaping Use: Never used  Substance and Sexual Activity   Alcohol use: Yes    Alcohol/week: 7.0 standard drinks of alcohol    Types: 7 Glasses of wine per week   Drug use: No   Sexual activity: Yes    Birth control/protection: None  Other Topics Concern   Not on file  Social History Narrative   Not on file  Social Determinants of Health   Financial Resource Strain: Low Risk  (12/05/2021)   Overall Financial Resource Strain (CARDIA)    Difficulty of Paying Living Expenses: Not hard at all  Food Insecurity: No Food Insecurity (12/05/2021)   Hunger Vital Sign    Worried About Running Out of Food in the Last Year: Never true    Ran Out of Food in the Last Year: Never true  Transportation Needs: No Transportation Needs (12/05/2021)   PRAPARE - Hydrologist (Medical): No    Lack of Transportation (Non-Medical): No  Physical Activity: Insufficiently Active (12/05/2021)   Exercise Vital Sign    Days of Exercise per Week: 5 days    Minutes of Exercise per Session: 10 min  Stress: No Stress  Concern Present (12/05/2021)   [place]    Feeling of Stress : Not at all  Social Connections: Moderately Isolated (12/05/2021)   Social Connection and Isolation Panel [NHANES]    Frequency of Communication with Friends and Family: More than three times a week    Frequency of Social Gatherings with Friends and Family: More than three times a week    Attends Religious Services: Never    Marine scientist or Organizations: No    Attends Archivist Meetings: Never    Marital Status: Married  Human resources officer Violence: Not At Risk (12/05/2021)   Humiliation, Afraid, Rape, and Kick questionnaire    Fear of Current or Ex-Partner: No    Emotionally Abused: No    Physically Abused: No    Sexually Abused: No    Family History  Problem Relation Age of Onset   Heart disease Brother    Heart attack Brother    Lung cancer Brother    Prostate cancer Neg Hx    Bladder Cancer Neg Hx    Kidney cancer Neg Hx      Current Outpatient Medications:    aspirin 81 MG chewable tablet, Chew 1 tablet (81 mg total) by mouth daily., Disp: , Rfl:    atorvastatin (LIPITOR) 80 MG tablet, Take 1 tablet (80 mg total) by mouth daily., Disp: 90 tablet, Rfl: 3   Cholecalciferol (VITAMIN D3) 50 MCG (2000 UT) TABS, Take 4,000 Units by mouth daily., Disp: , Rfl:    isosorbide mononitrate (IMDUR) 30 MG 24 hr tablet, Take 15 mg by mouth daily., Disp: , Rfl:    losartan (COZAAR) 25 MG tablet, Take 0.5 tablets (12.5 mg total) by mouth daily., Disp: 45 tablet, Rfl: 3   pantoprazole (PROTONIX) 40 MG tablet, Take 1 tablet (40 mg total) by mouth 2 (two) times daily before a meal., Disp: 60 tablet, Rfl: 0   Relugolix 120 MG TABS, Take by mouth. Take 3 tablets (360 mg) by mouth as a loading dose on day 1, followed by 1 tablet (120 mg) once daily., Disp: , Rfl:    tamsulosin (FLOMAX) 0.4 MG CAPS capsule, Take 0.4 mg by mouth daily after supper., Disp: ,  Rfl:    ticagrelor (BRILINTA) 90 MG TABS tablet, Take 1 tablet (90 mg total) by mouth 2 (two) times daily., Disp: 180 tablet, Rfl: 3   nitroGLYCERIN (NITROSTAT) 0.4 MG SL tablet, Place 0.4 mg under the tongue every 5 (five) minutes as needed for chest pain. (Patient not taking: Reported on 02/16/2022), Disp: , Rfl:   Physical exam:  Physical Exam Constitutional:      Comments: Ambulates with crutches  Cardiovascular:     Rate and Rhythm: Normal rate and regular rhythm.     Heart sounds: Normal heart sounds.  Pulmonary:     Effort: Pulmonary effort is normal.     Breath sounds: Normal breath sounds.  Abdominal:     General: Bowel sounds are normal.     Palpations: Abdomen is soft.  Musculoskeletal:     Cervical back: Normal range of motion.  Skin:    General: Skin is warm and dry.  Neurological:     Mental Status: He is alert and oriented to person, place, and time.         Latest Ref Rng & Units 02/16/2022    9:24 AM  CMP  Glucose 70 - 99 mg/dL 111   BUN 8 - 23 mg/dL 30   Creatinine 0.61 - 1.24 mg/dL 1.17   Sodium 135 - 145 mmol/L 137   Potassium 3.5 - 5.1 mmol/L 4.1   Chloride 98 - 111 mmol/L 108   CO2 22 - 32 mmol/L 26   Calcium 8.9 - 10.3 mg/dL 8.4   Total Protein 6.5 - 8.1 g/dL 7.2   Total Bilirubin 0.3 - 1.2 mg/dL 0.7   Alkaline Phos 38 - 126 U/L 62   AST 15 - 41 U/L 20   ALT 0 - 44 U/L 14       Latest Ref Rng & Units 02/16/2022    9:24 AM  CBC  WBC 4.0 - 10.5 K/uL 6.4   Hemoglobin 13.0 - 17.0 g/dL 8.5   Hematocrit 39.0 - 52.0 % 27.3   Platelets 150 - 400 K/uL 332     No images are attached to the encounter.  No results found.   Assessment and plan- Patient is a 88 y.o. male who is here for follow-up of following issues  Castrate resistant prostate cancer: PSA from today is pendingBut the last 2 levels were stable around 25.  Scans from April 2023 did not show any overt evidence of progression and mild tracer avid nodal metastases in the lower neck chest  and abdomen which was similar to the scan a year prior.  Response to therapy and pelvic nodal metastases.  Patient did not wish to take Lupron.  He is therefore on oral relugolix which she is tolerating it well.  Patient knows that he needs to call to receive his next prescription.  If there is a gap of more than 7 days from his last dose he will need to take a loading dose of 360 mg on day 1 and then go back to 120 mg daily.  If PSA continues to trend up despite relugolix I will consider adding other oral antiandrogen therapies such as Zytiga or Xtandi.  Normocytic anemia: He has had an extensive work-up including bone marrow biopsy which was unrevealing.  I had suggested trial of Retacrit but patient wanted to wait until he gets a second opinion.  He has an appointment coming up with [facility] hematology in October 2023.   Visit Diagnosis 1. Prostate cancer ([place])   2. Normocytic anemia   3. High risk medication use      Dr. Randa Evens, MD, MPH [facility] [phone] 02/16/2022 1:46 PM

## 2022-02-19 ENCOUNTER — Telehealth: Payer: Self-pay | Admitting: *Deleted

## 2022-02-19 NOTE — Telephone Encounter (Signed)
Patient called stating that he is not going to take the cancer pills (Relugolix) any more. When asked the reason, he stated because they make him fall asleep and he cannot drive if he takes them and he is NOT giving up driving

## 2022-02-19 NOTE — Telephone Encounter (Signed)
He didn't seem to have any issues when I saw him 3 days ago. His psa is trending down. Can you call him and see if he will take the drug? I dont think a lower dose can be prescribed.

## 2022-02-20 ENCOUNTER — Other Ambulatory Visit: Payer: Self-pay

## 2022-02-20 ENCOUNTER — Inpatient Hospital Stay
Admission: EM | Admit: 2022-02-20 | Discharge: 2022-02-23 | DRG: 690 | Disposition: A | Discharge status: Home / Self Care | Payer: Medicare Other | Attending: Internal Medicine | Admitting: Internal Medicine

## 2022-02-20 ENCOUNTER — Emergency Department: Payer: Medicare Other

## 2022-02-20 ENCOUNTER — Telehealth: Payer: Self-pay

## 2022-02-20 DIAGNOSIS — N179 Acute kidney failure, unspecified: Secondary | ICD-10-CM | POA: Diagnosis present

## 2022-02-20 DIAGNOSIS — Z87440 Personal history of urinary (tract) infections: Secondary | ICD-10-CM | POA: Diagnosis not present

## 2022-02-20 DIAGNOSIS — I5032 Chronic diastolic (congestive) heart failure: Secondary | ICD-10-CM | POA: Diagnosis present

## 2022-02-20 DIAGNOSIS — Z955 Presence of coronary angioplasty implant and graft: Secondary | ICD-10-CM

## 2022-02-20 DIAGNOSIS — K429 Umbilical hernia without obstruction or gangrene: Secondary | ICD-10-CM | POA: Diagnosis present

## 2022-02-20 DIAGNOSIS — I2510 Atherosclerotic heart disease of native coronary artery without angina pectoris: Secondary | ICD-10-CM | POA: Diagnosis not present

## 2022-02-20 DIAGNOSIS — N3091 Cystitis, unspecified with hematuria: Secondary | ICD-10-CM | POA: Diagnosis not present

## 2022-02-20 DIAGNOSIS — C799 Secondary malignant neoplasm of unspecified site: Secondary | ICD-10-CM | POA: Diagnosis present

## 2022-02-20 DIAGNOSIS — N3090 Cystitis, unspecified without hematuria: Secondary | ICD-10-CM | POA: Diagnosis not present

## 2022-02-20 DIAGNOSIS — D638 Anemia in other chronic diseases classified elsewhere: Secondary | ICD-10-CM | POA: Diagnosis not present

## 2022-02-20 DIAGNOSIS — I130 Hypertensive heart and chronic kidney disease with heart failure and stage 1 through stage 4 chronic kidney disease, or unspecified chronic kidney disease: Secondary | ICD-10-CM | POA: Diagnosis present

## 2022-02-20 DIAGNOSIS — Z7982 Long term (current) use of aspirin: Secondary | ICD-10-CM

## 2022-02-20 DIAGNOSIS — N3289 Other specified disorders of bladder: Secondary | ICD-10-CM | POA: Diagnosis not present

## 2022-02-20 DIAGNOSIS — C61 Malignant neoplasm of prostate: Secondary | ICD-10-CM | POA: Diagnosis not present

## 2022-02-20 DIAGNOSIS — N189 Chronic kidney disease, unspecified: Secondary | ICD-10-CM

## 2022-02-20 DIAGNOSIS — Z87891 Personal history of nicotine dependence: Secondary | ICD-10-CM

## 2022-02-20 DIAGNOSIS — Z8546 Personal history of malignant neoplasm of prostate: Secondary | ICD-10-CM

## 2022-02-20 DIAGNOSIS — I10 Essential (primary) hypertension: Secondary | ICD-10-CM | POA: Diagnosis present

## 2022-02-20 DIAGNOSIS — G4733 Obstructive sleep apnea (adult) (pediatric): Secondary | ICD-10-CM | POA: Diagnosis present

## 2022-02-20 DIAGNOSIS — D649 Anemia, unspecified: Secondary | ICD-10-CM | POA: Diagnosis present

## 2022-02-20 DIAGNOSIS — Z9841 Cataract extraction status, right eye: Secondary | ICD-10-CM

## 2022-02-20 DIAGNOSIS — D6489 Other specified anemias: Secondary | ICD-10-CM | POA: Diagnosis not present

## 2022-02-20 DIAGNOSIS — K219 Gastro-esophageal reflux disease without esophagitis: Secondary | ICD-10-CM | POA: Diagnosis present

## 2022-02-20 DIAGNOSIS — Z9842 Cataract extraction status, left eye: Secondary | ICD-10-CM

## 2022-02-20 DIAGNOSIS — I252 Old myocardial infarction: Secondary | ICD-10-CM | POA: Diagnosis not present

## 2022-02-20 DIAGNOSIS — Z8249 Family history of ischemic heart disease and other diseases of the circulatory system: Secondary | ICD-10-CM | POA: Diagnosis not present

## 2022-02-20 DIAGNOSIS — Z961 Presence of intraocular lens: Secondary | ICD-10-CM | POA: Diagnosis not present

## 2022-02-20 DIAGNOSIS — D62 Acute posthemorrhagic anemia: Secondary | ICD-10-CM | POA: Diagnosis present

## 2022-02-20 DIAGNOSIS — Z79899 Other long term (current) drug therapy: Secondary | ICD-10-CM

## 2022-02-20 DIAGNOSIS — E785 Hyperlipidemia, unspecified: Secondary | ICD-10-CM | POA: Diagnosis not present

## 2022-02-20 DIAGNOSIS — Z923 Personal history of irradiation: Secondary | ICD-10-CM

## 2022-02-20 DIAGNOSIS — N281 Cyst of kidney, acquired: Secondary | ICD-10-CM | POA: Diagnosis not present

## 2022-02-20 DIAGNOSIS — R310 Gross hematuria: Secondary | ICD-10-CM | POA: Diagnosis present

## 2022-02-20 DIAGNOSIS — N400 Enlarged prostate without lower urinary tract symptoms: Secondary | ICD-10-CM | POA: Diagnosis present

## 2022-02-20 DIAGNOSIS — Z7902 Long term (current) use of antithrombotics/antiplatelets: Secondary | ICD-10-CM

## 2022-02-20 DIAGNOSIS — N1831 Chronic kidney disease, stage 3a: Secondary | ICD-10-CM | POA: Diagnosis present

## 2022-02-20 DIAGNOSIS — N3001 Acute cystitis with hematuria: Secondary | ICD-10-CM | POA: Diagnosis not present

## 2022-02-20 DIAGNOSIS — N390 Urinary tract infection, site not specified: Principal | ICD-10-CM | POA: Diagnosis present

## 2022-02-20 LAB — URINALYSIS, ROUTINE W REFLEX MICROSCOPIC
RBC / HPF: >50 RBC/hpf — ABNORMAL HIGH (ref 0–5)
Specific Gravity, Urine: 1.018 (ref 1.005–1.030)
Squamous Epithelial / HPF: NONE SEEN (ref 0–5)
WBC, UA: >50 WBC/hpf — ABNORMAL HIGH (ref 0–5)

## 2022-02-20 LAB — BASIC METABOLIC PANEL
Anion gap: 12 (ref 5–15)
BUN: 33 mg/dL — ABNORMAL HIGH (ref 8–23)
CO2: 23 mmol/L (ref 22–32)
Calcium: 8.9 mg/dL (ref 8.9–10.3)
Chloride: 104 mmol/L (ref 98–111)
Creatinine, Ser: 1.39 mg/dL — ABNORMAL HIGH (ref 0.61–1.24)
GFR, Estimated: 48 mL/min — ABNORMAL LOW (ref >60)
Glucose, Bld: 159 mg/dL — ABNORMAL HIGH (ref 70–99)
Potassium: 4.5 mmol/L (ref 3.5–5.1)
Sodium: 139 mmol/L (ref 135–145)

## 2022-02-20 LAB — CBC
HCT: 26.2 % — ABNORMAL LOW (ref 39.0–52.0)
Hemoglobin: 8 g/dL — ABNORMAL LOW (ref 13.0–17.0)
MCH: 28.2 pg (ref 26.0–34.0)
MCHC: 30.5 g/dL (ref 30.0–36.0)
MCV: 92.3 fL (ref 80.0–100.0)
Platelets: 349 10*3/uL (ref 150–400)
RBC: 2.84 MIL/uL — ABNORMAL LOW (ref 4.22–5.81)
RDW: 16.7 % — ABNORMAL HIGH (ref 11.5–15.5)
WBC: 9.6 10*3/uL (ref 4.0–10.5)
nRBC: 0 % (ref 0.0–0.2)

## 2022-02-20 LAB — PREPARE RBC (CROSSMATCH)

## 2022-02-20 MED ORDER — SODIUM CHLORIDE 0.9 % IV SOLN
2.0000 g | INTRAVENOUS | Status: DC
  Administered 2022-02-20 – 2022-02-22 (×3): 2 g via INTRAVENOUS
  Filled 2022-02-20 (×3): qty 20

## 2022-02-20 MED ORDER — SODIUM CHLORIDE 0.9 % IV BOLUS
500.0000 mL | Freq: Once | INTRAVENOUS | Status: AC
  Administered 2022-02-20: 500 mL via INTRAVENOUS

## 2022-02-20 MED ORDER — SODIUM CHLORIDE 0.9 % IV SOLN: INTRAVENOUS | Status: DC

## 2022-02-20 MED ORDER — TAMSULOSIN HCL 0.4 MG PO CAPS
0.4000 mg | ORAL_CAPSULE | Freq: Every day | ORAL | Status: DC
  Administered 2022-02-20 – 2022-02-22 (×3): 0.4 mg via ORAL
  Filled 2022-02-20 (×3): qty 1

## 2022-02-20 MED ORDER — PANTOPRAZOLE SODIUM 40 MG PO TBEC
40.0000 mg | DELAYED_RELEASE_TABLET | Freq: Two times a day (BID) | ORAL | Status: DC
  Administered 2022-02-21 – 2022-02-23 (×5): 40 mg via ORAL
  Filled 2022-02-20 (×6): qty 1

## 2022-02-20 MED ORDER — MELATONIN 5 MG PO TABS
5.0000 mg | ORAL_TABLET | Freq: Every evening | ORAL | Status: DC | PRN
Start: 2022-02-20 — End: 2022-02-23

## 2022-02-20 MED ORDER — POLYETHYLENE GLYCOL 3350 17 G PO PACK
17.0000 g | PACK | Freq: Every day | ORAL | Status: DC | PRN
  Administered 2022-02-21: 17 g via ORAL
  Filled 2022-02-20: qty 1

## 2022-02-20 MED ORDER — SODIUM CHLORIDE 0.9% IV SOLUTION
Freq: Once | INTRAVENOUS | Status: DC
  Filled 2022-02-20: qty 250

## 2022-02-20 MED ORDER — ACETAMINOPHEN 325 MG PO TABS: 650.0000 mg | ORAL_TABLET | Freq: Four times a day (QID) | ORAL | Status: DC | PRN

## 2022-02-20 MED ORDER — ATORVASTATIN CALCIUM 80 MG PO TABS
80.0000 mg | ORAL_TABLET | Freq: Every day | ORAL | Status: DC
  Administered 2022-02-21 – 2022-02-22 (×2): 80 mg via ORAL
  Filled 2022-02-20: qty 1
  Filled 2022-02-20: qty 4
  Filled 2022-02-20: qty 1

## 2022-02-20 MED ORDER — ASPIRIN 81 MG PO CHEW
81.0000 mg | CHEWABLE_TABLET | Freq: Every day | ORAL | Status: DC
  Administered 2022-02-20 – 2022-02-23 (×4): 81 mg via ORAL
  Filled 2022-02-20 (×4): qty 1

## 2022-02-20 MED ORDER — PROCHLORPERAZINE EDISYLATE 10 MG/2ML IJ SOLN: 10.0000 mg | Freq: Four times a day (QID) | INTRAMUSCULAR | Status: DC | PRN

## 2022-02-20 MED ORDER — RELUGOLIX 120 MG PO TABS: 120.0000 mg | ORAL_TABLET | Freq: Every day | ORAL | Status: DC

## 2022-02-20 NOTE — ED Notes (Signed)
Rainbow drawn and sent to lab. °

## 2022-02-20 NOTE — ED Provider Notes (Signed)
[facility] Provider Note    Event Date/Time   First MD Initiated Contact with Patient 02/20/22 1116     (approximate)   History   Hematuria   HPI  Cesar Powell is a 88 y.o. male with a history of hypertension, CAD, OSA, CKD, anemia, and prostate cancer on radiation who presents with hematuria and dysuria acute onset since yesterday.  He describes the dysuria as burning.  It is similar to what he has had from prior UTIs, most recently early last month.  The patient also states that today he feels like he cannot really urinate.  He denies any flank or abdominal pain, fever or chills, or vomiting.      Physical Exam   Triage Vital Signs: ED Triage Vitals [02/20/22 1050]  Enc Vitals Group     BP (!) 130/51     Pulse Rate 90     Resp 17     Temp 98.1 F (36.7 C)     Temp Source Oral     SpO2 97 %     Weight      Height      Head Circumference      Peak Flow      Pain Score      Pain Loc      Pain Edu?      Excl. in [place]?     Most recent vital signs: Vitals:   02/20/22 1330 02/20/22 1430  BP: (!) 161/62 (!) 148/61  Pulse: 75 71  Resp: 16 16  Temp:  98.6 F (37 C)  SpO2: 100% 99%     General: Awake, no distress.  CV:  Good peripheral perfusion.  Resp:  Normal effort.  Abd:  Soft and nontender.  No distention.  Other:  No CVA tenderness.   ED Results / Procedures / Treatments   Labs (all labs ordered are listed, but only abnormal results are displayed) Labs Reviewed  URINALYSIS, ROUTINE W REFLEX MICROSCOPIC - Abnormal; Notable for the following components:      Result Value   Color, Urine RED (*)    APPearance CLOUDY (*)    Glucose, UA   (*)    Value: TEST NOT REPORTED DUE TO COLOR INTERFERENCE OF URINE PIGMENT   Hgb urine dipstick   (*)    Value: TEST NOT REPORTED DUE TO COLOR INTERFERENCE OF URINE PIGMENT   Bilirubin Urine   (*)    Value: TEST NOT REPORTED DUE TO COLOR INTERFERENCE OF URINE PIGMENT   Ketones, ur   (*)     Value: TEST NOT REPORTED DUE TO COLOR INTERFERENCE OF URINE PIGMENT   Protein, ur   (*)    Value: TEST NOT REPORTED DUE TO COLOR INTERFERENCE OF URINE PIGMENT   Nitrite   (*)    Value: TEST NOT REPORTED DUE TO COLOR INTERFERENCE OF URINE PIGMENT   Leukocytes,Ua   (*)    Value: TEST NOT REPORTED DUE TO COLOR INTERFERENCE OF URINE PIGMENT   RBC / HPF >50 (*)    WBC, UA >50 (*)    Bacteria, UA MANY (*)    All other components within normal limits  BASIC METABOLIC PANEL - Abnormal; Notable for the following components:   Glucose, Bld 159 (*)    BUN 33 (*)    Creatinine, Ser 1.39 (*)    GFR, Estimated 48 (*)    All other components within normal limits  CBC - Abnormal; Notable for the following  components:   RBC 2.84 (*)    Hemoglobin 8.0 (*)    HCT 26.2 (*)    RDW 16.7 (*)    All other components within normal limits     EKG     RADIOLOGY  CT abdomen/pelvis: I independently viewed and interpreted the images; there are no visible ureteral stones.  Radiology report also indicates the following:  1. No urinary tract calculus or hydronephrosis. 2. Diffusely thickened urinary bladder with adjacent fat stranding and heterogeneously hyperdense material dependently, consistent with nonspecific infectious or inflammatory cystitis and most likely blood product or clot in the bladder lumen. Correlate with urinalysis. Radiation induced cystitis is a differential consideration. 3. Prostatomegaly with probable TURP defect. Unchanged heterogeneous dystrophic calcification within the right aspect of the prostate. 4. Unchanged retroperitoneal lymphadenopathy. 5. New sclerotic wedge deformity of L1 vertebral body as well as sclerotic inferior endplate deformity of the T12 vertebral body. Although nonspecific, this is worrisome for a pathologic fracture in the setting of known metastatic prostate malignancy. 6. Small umbilical hernia containing fat and a single, nonobstructed loop of  mid small bowel. 7. Coronary artery disease.   PROCEDURES:  Critical Care performed: No  Procedures   MEDICATIONS ORDERED IN ED: Medications  sodium chloride 0.9 % bolus 500 mL (500 mLs Intravenous New Bag/Given 02/20/22 1320)     IMPRESSION / MDM / ASSESSMENT AND PLAN / ED COURSE  I reviewed the triage vital signs and the nursing notes.  88 year old male with PMH as noted above presents with hematuria and dysuria since yesterday.  I reviewed the past medical records.  The patient was admitted last month.  Per the hospitalist discharge summary from 6/9 he presented with weakness and dysuria and was diagnosed with a UTI.  He also had AKI at that time.  Differential diagnosis includes, but is not limited to, recurrent UTI/cystitis, urinary obstruction, ureteral stone.  Patient's presentation is most consistent with acute presentation with potential threat to life or bodily function.  We will obtain CT to rule out stone or other abnormal bladder findings, lab work-up, urinalysis, and reassess.  ----------------------------------------- 3:37 PM on 02/20/2022 -----------------------------------------  CT shows findings of cystitis but no ureteral stones.  Urinalysis shows significant RBCs and WBCs consistent with active UTI.  The patient's prior urine culture from his last admission showed multiple species.  I have ordered IV ceftriaxone.  Given the hematuria, the patient's baseline significant anemia, and possibility of urinary obstruction I recommended admitting the patient and he agrees.  I consulted Dr. Bernardo Heater from urology to evaluate the patient and consulted Dr. Nevada Crane from the hospitalist service; based on our discussion she agrees to admit.   FINAL CLINICAL IMPRESSION(S) / ED DIAGNOSES   Final diagnoses:  Hemorrhagic cystitis     Rx / DC Orders   ED Discharge Orders     None        Note:  This document was prepared using Dragon voice recognition software and  may include unintentional dictation errors.    Arta Silence, MD 02/20/22 1538

## 2022-02-20 NOTE — ED Notes (Signed)
Pt denies needs at this time.  

## 2022-02-20 NOTE — Telephone Encounter (Signed)
PT LEFT V/M STATING HAS A BLADDER INFECTION AND IS BLEEDING. WANTS TO BE SEEN TODAY WITH DR Diamantina Providence.

## 2022-02-20 NOTE — H&P (Addendum)
History and Physical  [name]:[id number] DOB: 05-22-1931 DOA: 02/20/2022  Referring physician: Dr. Cherylann Banas, [place]  PCP: Owens Loffler, MD  Outpatient Specialists: Cardiology, urology, medical oncology. Patient coming from: Home  Chief Complaint: Gross hematuria, burning with urination, and difficulty emptying his bladder.  HPI: Cesar Powell is a 88 y.o. male with medical history significant for coronary artery disease status post PCI with DES on aspirin, Brilinta and Lipitor (05/13/2021), prostate cancer status post radiation in 2015 on Relugolix, chronic normocytic anemia, who presented to [facility] ED with complaints of gross hematuria since yesterday.  Associated with dysuria and inability to empty his bladder.  No subjective fevers, chills, vomiting, or abdominal pain.  Last dose of home DAPT aspirin and Brilinta per the patient was yesterday 02/19/2022.  In the ED, work-up reveals UA positive for pyuria and gross hematuria.  CT renal stone study revealed no urinary tract calculus or hydronephrosis.  Diffusely thickened urinary bladder with adjacent fat stranding in the region consistent with nonspecific infectious or inflammatory cystitis and most likely blood product or clot in the bladder lumen.  Prostatomegaly with probable TURP defect.  EDP consulted urology, Dr. Bernardo Heater, who will see in consultation.  Did not receive DAPT in the ED, will consult cardiology for input.  Received 500 cc normal saline bolus.  The patient was admitted by [facility], hospitalist service.  ED Course: Tmax 98.6.  BP 137/58, pulse 59, respiratory 16, O2 saturation 100% on room air.  Lab studies remarkable for BUN 33, creatinine 1.39, GFR 48.  Baseline creatinine 1.0 with GFR of >60.  Hemoglobin 8.0 with MCV of 92.  Baseline hemoglobin 9.5.  Review of Systems: Review of systems as noted in the HPI. All other systems reviewed and are negative.   Past Medical History:  Diagnosis Date   Coronary artery  disease involving native coronary artery of native heart 08/25/2021   H/O prostate cancer    was treated with radiation   Hypertension    Local recurrence of prostate cancer ([place]) 06/21/2004   Qualifier: Diagnosis of  By: Fuller Plan CMA (AAMA), [place]     Myocardial infarction ([place]) 2022   Obstructive sleep apnea 06/22/2007   NPSG [place] 1979 Unattended Home sleep Study- 05/10/14- Confirms severe OSA, AHI 41.8/ hr, weight 230 pounds CPAP and also Transcend portable CPAP auto 8-15     Sleep apnea 1977   uses C-pap    Past Surgical History:  Procedure Laterality Date   ABDOMINAL SURGERY     ruptured intestines    CATARACT EXTRACTION W/PHACO Left 01/29/2016   Procedure: CATARACT EXTRACTION PHACO AND INTRAOCULAR LENS PLACEMENT ([place]) left eye;  Surgeon: Leandrew Koyanagi, MD;  Location: [place];  Service: Ophthalmology;  Laterality: Left;  RESTOR LENS   CATARACT EXTRACTION W/PHACO Right 10/21/2016   Procedure: CATARACT EXTRACTION PHACO AND INTRAOCULAR LENS PLACEMENT ([facility])  Right restor toric lens;  Surgeon: Leandrew Koyanagi, MD;  Location: [place];  Service: Ophthalmology;  Laterality: Right;  restor toric lens   CORONARY/GRAFT ACUTE MI REVASCULARIZATION N/A 05/13/2021   Procedure: Coronary/Graft Acute MI Revascularization;  Surgeon: Nelva Bush, MD;  Location: [place] CV LAB;  Service: Cardiovascular;  Laterality: N/A;   CYSTOSCOPY WITH LITHOLAPAXY N/A 08/14/2016   Procedure: CYSTOSCOPY WITH LITHOLAPAXY;  Surgeon: Nickie Retort, MD;  Location: [facility];  Service: Urology;  Laterality: N/A;   FEMUR IM NAIL Right 10/08/2014   Procedure: INTRAMEDULLARY (IM) NAIL FEMORAL;  Surgeon: Rod Can, MD;  Location: [place];  Service: Orthopedics;  Laterality: Right;  PER DANIELLE 110 MIN   HOLMIUM LASER APPLICATION  9/48/5462   Procedure: HOLMIUM LASER APPLICATION;  Surgeon: Nickie Retort, MD;  Location: [facility];  Service: Urology;;   LEFT HEART CATH AND  CORONARY ANGIOGRAPHY N/A 05/13/2021   Procedure: LEFT HEART CATH AND CORONARY ANGIOGRAPHY;  Surgeon: Nelva Bush, MD;  Location: [place] CV LAB;  Service: Cardiovascular;  Laterality: N/A;   OTHER SURGICAL HISTORY  2006   Prostate Radiation Surgery    RADIOACTIVE SEED IMPLANT N/A 10/30/2019   Procedure: RADIOACTIVE SEED IMPLANT/BRACHYTHERAPY IMPLANT;  Surgeon: Billey Co, MD;  Location: [facility];  Service: Urology;  Laterality: N/A;  41 seeds implanted    Social History:  reports that he quit smoking about 56 years ago. His smoking use included cigarettes. He has a 15.00 pack-year smoking history. He has been exposed to tobacco smoke. He has never used smokeless tobacco. He reports current alcohol use of about 7.0 standard drinks of alcohol per week. He reports that he does not use drugs.   No Known Allergies  Family History  Problem Relation Age of Onset   Heart disease Brother    Heart attack Brother    Lung cancer Brother    Prostate cancer Neg Hx    Bladder Cancer Neg Hx    Kidney cancer Neg Hx       Prior to Admission medications   Medication Sig Start Date End Date Taking? Authorizing Provider  aspirin 81 MG chewable tablet Chew 1 tablet (81 mg total) by mouth daily. 05/16/21 05/16/22  Jennye Boroughs, MD  atorvastatin (LIPITOR) 80 MG tablet Take 1 tablet (80 mg total) by mouth daily. 08/25/21   Minna Merritts, MD  Cholecalciferol (VITAMIN D3) 50 MCG (2000 UT) TABS Take 4,000 Units by mouth daily.    [provider]  isosorbide mononitrate (IMDUR) 30 MG 24 hr tablet Take 15 mg by mouth daily. 11/14/21   [provider]  losartan (COZAAR) 25 MG tablet Take 0.5 tablets (12.5 mg total) by mouth daily. 08/25/21   Minna Merritts, MD  nitroGLYCERIN (NITROSTAT) 0.4 MG SL tablet Place 0.4 mg under the tongue every 5 (five) minutes as needed for chest pain. Patient not taking: Reported on 02/16/2022    [provider]  pantoprazole (PROTONIX)  40 MG tablet Take 1 tablet (40 mg total) by mouth 2 (two) times daily before a meal. 12/13/21   Amin, Jeanella Flattery, MD  Relugolix 120 MG TABS Take by mouth. Take 3 tablets (360 mg) by mouth as a loading dose on day 1, followed by 1 tablet (120 mg) once daily.    Sindy Guadeloupe, MD  tamsulosin (FLOMAX) 0.4 MG CAPS capsule Take 0.4 mg by mouth daily after supper.    [provider]  ticagrelor (BRILINTA) 90 MG TABS tablet Take 1 tablet (90 mg total) by mouth 2 (two) times daily. 08/21/21   Furth, Cadence H, PA-C    Physical Exam: BP (!) 148/61   Pulse 71   Temp 98.6 F (37 C) (Oral)   Resp 16   SpO2 99%   General: 88 y.o. year-old male well developed well nourished in no acute distress.  Alert and oriented x3. Cardiovascular: Regular rate and rhythm with no rubs or gallops.  No thyromegaly or JVD noted.  Trace lower extremity edema bilaterally. Respiratory: Clear to auscultation with no wheezes or rales. Good inspiratory effort. Abdomen: Soft nontender nondistended with normal bowel sounds x4  quadrants. Muskuloskeletal: No cyanosis or clubbing.  Trace lower extremity edema noted bilaterally Neuro: CN II-XII intact, strength, sensation, reflexes Skin: No ulcerative lesions noted or rashes Psychiatry: Judgement and insight appear normal. Mood is appropriate for condition and setting          Labs on Admission:  Basic Metabolic Panel: Recent Labs  Lab 02/16/22 0924 02/20/22 1103  NA 137 139  K 4.1 4.5  CL 108 104  CO2 26 23  GLUCOSE 111* 159*  BUN 30* 33*  CREATININE 1.17 1.39*  CALCIUM 8.4* 8.9   Liver Function Tests: Recent Labs  Lab 02/16/22 0924  AST 20  ALT 14  ALKPHOS 62  BILITOT 0.7  PROT 7.2  ALBUMIN 3.2*   No results for input(s): "LIPASE", "AMYLASE" in the last 168 hours. No results for input(s): "AMMONIA" in the last 168 hours. CBC: Recent Labs  Lab 02/16/22 0924 02/20/22 1103  WBC 6.4 9.6  NEUTROABS 5.0  --   HGB 8.5* 8.0*  HCT 27.3* 26.2*   MCV 93.5 92.3  PLT 332 349   Cardiac Enzymes: No results for input(s): "CKTOTAL", "CKMB", "CKMBINDEX", "TROPONINI" in the last 168 hours.  BNP (last 3 results) Recent Labs    04/13/21 2128 08/21/21 1524  BNP 481.6* 286.5*    ProBNP (last 3 results) No results for input(s): "PROBNP" in the last 8760 hours.  CBG: No results for input(s): "GLUCAP" in the last 168 hours.  Radiological Exams on Admission: CT Renal Stone Study  Result Date: 02/20/2022 CLINICAL DATA:  Flank pain, kidney stones suspected, hematuria, history of recurrent metastatic prostate cancer * Tracking Code: BO * EXAM: CT ABDOMEN AND PELVIS WITHOUT CONTRAST TECHNIQUE: Multidetector CT imaging of the abdomen and pelvis was performed following the standard protocol without IV contrast. RADIATION DOSE REDUCTION: This exam was performed according to the departmental dose-optimization program which includes automated exposure control, adjustment of the mA and/or kV according to patient size and/or use of iterative reconstruction technique. COMPARISON:  PET-CT, 11/25/2021, CT chest abdomen pelvis, 04/02/2021 FINDINGS: Lower chest: No acute abnormality.  Coronary artery calcifications. Hepatobiliary: No solid liver abnormality is seen. No gallstones, gallbladder wall thickening, or biliary dilatation. Pancreas: Unremarkable. No pancreatic ductal dilatation or surrounding inflammatory changes. Spleen: Normal in size without significant abnormality. Adrenals/Urinary Tract: Adrenal glands are unremarkable. Simple, benign bilateral renal cortical cysts, for which no further follow-up or characterization is required kidneys are otherwise normal, without renal calculi, solid lesion, or hydronephrosis. Diffusely thickened urinary bladder with adjacent fat stranding and heterogeneously hyperdense material dependently (series 2, image 64). Stomach/Bowel: Stomach is within normal limits. Appendix appears normal. No evidence of bowel wall  thickening, distention, or inflammatory changes. Sigmoid diverticula. Vascular/Lymphatic: Aortic atherosclerosis. Unchanged retroperitoneal lymphadenopathy, index left retroperitoneal node measuring 2.3 x 1.3 cm (series 2, image 32). Reproductive: Prostatomegaly. Probable TURP defect. Unchanged heterogeneous dystrophic calcification within the right aspect of the prostate (series 2, image 76). Other: Small umbilical hernia containing fat and a single, nonobstructed loop of mid small bowel (series 2, image 60). No ascites. Musculoskeletal: New sclerotic wedge deformity of L1 vertebral body (series 6, image 90) as well as sclerotic inferior endplate deformity of the T12 vertebral body (series 6, image 98). Unchanged inferior endplate deformity of L3 and superior endplate deformity of L4 (series 6, image 92). IMPRESSION: 1. No urinary tract calculus or hydronephrosis. 2. Diffusely thickened urinary bladder with adjacent fat stranding and heterogeneously hyperdense material dependently, consistent with nonspecific infectious or inflammatory cystitis and most likely blood  product or clot in the bladder lumen. Correlate with urinalysis. Radiation induced cystitis is a differential consideration. 3. Prostatomegaly with probable TURP defect. Unchanged heterogeneous dystrophic calcification within the right aspect of the prostate. 4. Unchanged retroperitoneal lymphadenopathy. 5. New sclerotic wedge deformity of L1 vertebral body as well as sclerotic inferior endplate deformity of the T12 vertebral body. Although nonspecific, this is worrisome for a pathologic fracture in the setting of known metastatic prostate malignancy. 6. Small umbilical hernia containing fat and a single, nonobstructed loop of mid small bowel. 7. Coronary artery disease. Aortic Atherosclerosis (ICD10-I70.0). Electronically Signed   By: Delanna Ahmadi M.D.   On: 02/20/2022 12:28    EKG: I independently viewed the EKG done and my findings are as  followed:    Assessment/Plan Present on Admission:  Gross hematuria  Principal Problem:   Gross hematuria  Gross hematuria in the setting of acute urinary retention, complicated UTI with prostamegaly, history of prostate cancer postradiation (2015) on home [facility] antagonist Urology consulted CT renal study negative for urinary tract calculus or hydronephrosis.  Diffusely thickened urinary bladder with adjacent fat stranding in the region consistent with nonspecific infectious or inflammatory cystitis and most likely blood product or clot in the bladder lumen.  Prostatomegaly with probable TURP defect.  May require bladder irrigation, defer to urology. Follow urine culture Rocephin started empirically 1 unit PRBCs ordered. Monitor H&H and transfuse hemoglobin less than 8.0.  Acute blood loss anemia in the setting of gross hematuria Hemoglobin 8.0 on presentation. Ongoing gross hematuria. Maintain hemoglobin greater than 8.0 in the setting of coronary artery disease with recent PCI with stenting.  Complicated UTI, POA Obtain urine culture Started on Rocephin empirically. Follow urine culture for ID and sensitivities to narrow down antibiotics. Blood cultures x2 peripherally added due to immunosuppression.  Coronary artery disease status post PCI with stent placement on 05/13/2021 On home DAPT aspirin and Brilinta The patient denies any anginal symptoms. Discussed with cardiology Dr. Rockey Situ, okay to hold off Brilinta and continue home aspirin 81 mg daily. Can resume Brilinta once hematuria has resolved.  History of prostate cancer s/p radiation in 2015 currently on GnRH antagonist. Prostatomegaly seen on CT renal study. Currently on home relugolix Management per urology  AKI, suspect prerenal in the setting of hypovolemia with gross hematuria Baseline creatinine 1.0 GFR greater than 60 Presented with creatinine 1.39 with GFR 48 Gentle IV fluid hydration Avoid nephrotoxic  agents, dehydration and hypotension. Monitor urine output with strict I's and O's Repeat renal function test in the morning.  Chronic diastolic CHF Last 2D echo done on 05/15/2021 revealed LVEF 55% with grade 1 diastolic dysfunction. Euvolemic on exam Closely monitor volume status while on IV fluid Start strict I's and O's and daily weight.  GERD/hyperlipidemia/BPH Resume home regimen  Incidental findings on CT scan: -New sclerotic wedge deformity of L1 vertebral body as well as sclerotic inferior endplate deformity of the T12 vertebral body. Although nonspecific, this is worrisome for a pathologic fracture in the setting of known metastatic prostate malignancy. -Small umbilical hernia containing fat and a single, nonobstructed loop of mid small bowel.   Critical care time: 65 minutes.    DVT prophylaxis: SCDs.  Pharmacological DVT prophylaxis contraindicated in the setting of gross hematuria  Code Status: Full code  Family Communication: None at bedside  Disposition Plan: Admitted to progressive unit  Consults called: Cardiology, urology.  Admission status: Inpatient status.   Status is: Inpatient The patient requires at least 2 midnights for further  evaluation and treatment of present condition.   Kayleen Memos MD [facility] Pager [phone]  If 7PM-7AM, please contact night-coverage [email or web address] Password [facility]  02/20/2022, 3:40 PM

## 2022-02-20 NOTE — Telephone Encounter (Signed)
Pettengill, Almyra Free A 1 hour ago (8:49 AM)   JP PT LEFT V/M STATING HAS A BLADDER INFECTION AND IS BLEEDING. WANTS TO BE SEEN TODAY WITH DR Diamantina Providence.     Attempted to call patient, he is currently in the ED.

## 2022-02-20 NOTE — ED Notes (Signed)
RN did a full bed linen change, pericare was done. Pt placed on new male purwick device.

## 2022-02-20 NOTE — ED Notes (Signed)
Attempted IV x 2 without success. Patient tolerated well. 

## 2022-02-20 NOTE — Consult Note (Signed)
Urology Consult  Requesting physician: Arta Silence, MD  Reason for consultation: Recurrent UTI, hematuria  Chief Complaint: Blood in urine  History of Present Illness: Cesar Powell is a 88 y.o. male with a complex urological history followed by Dr. Diamantina Providence.  He has metastatic prostate cancer and was started on relugolix by Dr. Janese Banks June 2023.  Originally treated with radiation in 2005, and was on lupron for biochemical recurrence prior to discontinuing secondary to side effects. He has a history of cystolitholopaxy by Dr Pilar Jarvis in 2018. He also underwent brachytherapy and XRT to abdominal nodes in 2021 with Dr Baruch Gouty.  He also remains on dual anticoagulation after an MI in October 2022.  Was recently hospitalized mid May 2023 after a fall and also noted to have anemia requiring blood transfusion.  History of recurrent urinary retention last Fall of unclear etiology.  Cystoscopy 06/2021 showed no significant occlusion.  He was on intermittent catheterization and subsequently discontinued after several bladder scans showed no significant residual.  Onset of gross hematuria and dysuria yesterday which he states is similar to prior UTIs.  Remains on aspirin and Brilinta.  Cath UA showed >50 RBC/>50 WBCs and many bacteria.  Urine culture has been ordered and he has been started on ceftriaxone  Noncontrast CT showed no hydronephrosis.  Hyperdense material was noted in the bladder most likely consistent with clot.  Denies sensation of bladder fullness.  He does not have a Foley catheter.  Was admitted to hospitalist service secondary to anemia.  Past Medical History:  Diagnosis Date   Coronary artery disease involving native coronary artery of native heart 08/25/2021   H/O prostate cancer    was treated with radiation   Hypertension    Local recurrence of prostate cancer ([place]) 06/21/2004   Qualifier: Diagnosis of  By: Fuller Plan CMA (AAMA), [place]     Myocardial infarction ([place]) 2022    Obstructive sleep apnea 06/22/2007   NPSG [place] 1979 Unattended Home sleep Study- 05/10/14- Confirms severe OSA, AHI 41.8/ hr, weight 230 pounds CPAP and also Transcend portable CPAP auto 8-15     Sleep apnea 1977   uses C-pap     Past Surgical History:  Procedure Laterality Date   ABDOMINAL SURGERY     ruptured intestines    CATARACT EXTRACTION W/PHACO Left 01/29/2016   Procedure: CATARACT EXTRACTION PHACO AND INTRAOCULAR LENS PLACEMENT ([place]) left eye;  Surgeon: Leandrew Koyanagi, MD;  Location: [place];  Service: Ophthalmology;  Laterality: Left;  RESTOR LENS   CATARACT EXTRACTION W/PHACO Right 10/21/2016   Procedure: CATARACT EXTRACTION PHACO AND INTRAOCULAR LENS PLACEMENT ([facility])  Right restor toric lens;  Surgeon: Leandrew Koyanagi, MD;  Location: [facility];  Service: Ophthalmology;  Laterality: Right;  restor toric lens   CORONARY/GRAFT ACUTE MI REVASCULARIZATION N/A 05/13/2021   Procedure: Coronary/Graft Acute MI Revascularization;  Surgeon: Nelva Bush, MD;  Location: [facility];  Service: Cardiovascular;  Laterality: N/A;   CYSTOSCOPY WITH LITHOLAPAXY N/A 08/14/2016   Procedure: CYSTOSCOPY WITH LITHOLAPAXY;  Surgeon: Nickie Retort, MD;  Location: [facility];  Service: Urology;  Laterality: N/A;   FEMUR IM NAIL Right 10/08/2014   Procedure: INTRAMEDULLARY (IM) NAIL FEMORAL;  Surgeon: Rod Can, MD;  Location: [facility];  Service: Orthopedics;  Laterality: Right;  PER DANIELLE 110 MIN   HOLMIUM LASER APPLICATION  03/27/36   Procedure: HOLMIUM LASER APPLICATION;  Surgeon: Nickie Retort, MD;  Location: [facility];  Service: Urology;;   LEFT HEART CATH  AND CORONARY ANGIOGRAPHY N/A 05/13/2021   Procedure: LEFT HEART CATH AND CORONARY ANGIOGRAPHY;  Surgeon: Nelva Bush, MD;  Location: [facility];  Service: Cardiovascular;  Laterality: N/A;   OTHER SURGICAL HISTORY  2006   Prostate Radiation Surgery    RADIOACTIVE SEED IMPLANT  N/A 10/30/2019   Procedure: RADIOACTIVE SEED IMPLANT/BRACHYTHERAPY IMPLANT;  Surgeon: Billey Co, MD;  Location: [facility];  Service: Urology;  Laterality: N/A;  41 seeds implanted    Home Medications:  Current Meds  Medication Sig   aspirin 81 MG chewable tablet Chew 1 tablet (81 mg total) by mouth daily.   atorvastatin (LIPITOR) 80 MG tablet Take 1 tablet (80 mg total) by mouth daily.   Cholecalciferol (VITAMIN D3) 50 MCG (2000 UT) TABS Take 4,000 Units by mouth daily.   isosorbide mononitrate (IMDUR) 30 MG 24 hr tablet Take 15 mg by mouth daily.   losartan (COZAAR) 25 MG tablet Take 0.5 tablets (12.5 mg total) by mouth daily.   pantoprazole (PROTONIX) 40 MG tablet Take 1 tablet (40 mg total) by mouth 2 (two) times daily before a meal.   Relugolix 120 MG TABS Take by mouth. Take 3 tablets (360 mg) by mouth as a loading dose on day 1, followed by 1 tablet (120 mg) once daily.   tamsulosin (FLOMAX) 0.4 MG CAPS capsule Take 0.4 mg by mouth daily after supper.   ticagrelor (BRILINTA) 90 MG TABS tablet Take 1 tablet (90 mg total) by mouth 2 (two) times daily.    Allergies: No Known Allergies  Family History  Problem Relation Age of Onset   Heart disease Brother    Heart attack Brother    Lung cancer Brother    Prostate cancer Neg Hx    Bladder Cancer Neg Hx    Kidney cancer Neg Hx     Social History:  reports that he quit smoking about 56 years ago. His smoking use included cigarettes. He has a 15.00 pack-year smoking history. He has been exposed to tobacco smoke. He has never used smokeless tobacco. He reports current alcohol use of about 7.0 standard drinks of alcohol per week. He reports that he does not use drugs.  ROS: A complete review of systems was performed.  All systems are negative except for pertinent findings as noted.  Physical Exam:  Vital signs in last 24 hours: Temp:  [97.9 F (36.6 C)-98.6 F (37 C)] 97.9 F (36.6 C) (07/21 1630) Pulse Rate:  [64-90] 64  (07/21 1630) Resp:  [16-17] 16 (07/21 1630) BP: (126-161)/(51-62) 128/62 (07/21 1630) SpO2:  [97 %-100 %] 98 % (07/21 1630) Constitutional:  Alert and oriented, No acute distress HEENT: [place] AT Respiratory: Normal respiratory effort Psychiatric: Normal mood and affect   Laboratory Data:  Recent Labs    02/20/22 1103  WBC 9.6  HGB 8.0*  HCT 26.2*   Recent Labs    02/20/22 1103  NA 139  K 4.5  CL 104  CO2 23  GLUCOSE 159*  BUN 33*  CREATININE 1.39*  CALCIUM 8.9   No results for input(s): "LABPT", "INR" in the last 72 hours. No results for input(s): "LABURIN" in the last 72 hours. Results for orders placed or performed during the hospital encounter of 01/06/22  Urine Culture     Status: Abnormal   Collection Time: 01/06/22  1:12 PM   Specimen: Urine, Random  Result Value Ref Range Status   Specimen Description   Final    URINE, RANDOM Performed at  [facility], [address], [place], [place] [address]    Special Requests   Final    NONE Performed at [facility], [address] [place], [place] [address]    Culture MULTIPLE SPECIES PRESENT, SUGGEST RECOLLECTION (A)  Final   Report Status 01/08/2022 FINAL  Final     Radiologic Imaging: CT images personally reviewed and interpreted CT Renal Stone Study  Result Date: 02/20/2022 CLINICAL DATA:  Flank pain, kidney stones suspected, hematuria, history of recurrent metastatic prostate cancer * Tracking Code: BO * EXAM: CT ABDOMEN AND PELVIS WITHOUT CONTRAST TECHNIQUE: Multidetector CT imaging of the abdomen and pelvis was performed following the standard protocol without IV contrast. RADIATION DOSE REDUCTION: This exam was performed according to the departmental dose-optimization program which includes automated exposure control, adjustment of the mA and/or kV according to patient size and/or use of iterative reconstruction technique. COMPARISON:  PET-CT, 11/25/2021, CT chest abdomen pelvis, 04/02/2021  FINDINGS: Lower chest: No acute abnormality.  Coronary artery calcifications. Hepatobiliary: No solid liver abnormality is seen. No gallstones, gallbladder wall thickening, or biliary dilatation. Pancreas: Unremarkable. No pancreatic ductal dilatation or surrounding inflammatory changes. Spleen: Normal in size without significant abnormality. Adrenals/Urinary Tract: Adrenal glands are unremarkable. Simple, benign bilateral renal cortical cysts, for which no further follow-up or characterization is required kidneys are otherwise normal, without renal calculi, solid lesion, or hydronephrosis. Diffusely thickened urinary bladder with adjacent fat stranding and heterogeneously hyperdense material dependently (series 2, image 64). Stomach/Bowel: Stomach is within normal limits. Appendix appears normal. No evidence of bowel wall thickening, distention, or inflammatory changes. Sigmoid diverticula. Vascular/Lymphatic: Aortic atherosclerosis. Unchanged retroperitoneal lymphadenopathy, index left retroperitoneal node measuring 2.3 x 1.3 cm (series 2, image 32). Reproductive: Prostatomegaly. Probable TURP defect. Unchanged heterogeneous dystrophic calcification within the right aspect of the prostate (series 2, image 76). Other: Small umbilical hernia containing fat and a single, nonobstructed loop of mid small bowel (series 2, image 60). No ascites. Musculoskeletal: New sclerotic wedge deformity of L1 vertebral body (series 6, image 90) as well as sclerotic inferior endplate deformity of the T12 vertebral body (series 6, image 98). Unchanged inferior endplate deformity of L3 and superior endplate deformity of L4 (series 6, image 92). IMPRESSION: 1. No urinary tract calculus or hydronephrosis. 2. Diffusely thickened urinary bladder with adjacent fat stranding and heterogeneously hyperdense material dependently, consistent with nonspecific infectious or inflammatory cystitis and most likely blood product or clot in the bladder  lumen. Correlate with urinalysis. Radiation induced cystitis is a differential consideration. 3. Prostatomegaly with probable TURP defect. Unchanged heterogeneous dystrophic calcification within the right aspect of the prostate. 4. Unchanged retroperitoneal lymphadenopathy. 5. New sclerotic wedge deformity of L1 vertebral body as well as sclerotic inferior endplate deformity of the T12 vertebral body. Although nonspecific, this is worrisome for a pathologic fracture in the setting of known metastatic prostate malignancy. 6. Small umbilical hernia containing fat and a single, nonobstructed loop of mid small bowel. 7. Coronary artery disease. Aortic Atherosclerosis (ICD10-I70.0). Electronically Signed   By: Delanna Ahmadi M.D.   On: 02/20/2022 12:28     Impression/Recommendation:   1.  Gross hematuria Most likely secondary to combination of UTI and anticoagulant therapy Bladder not distended on CT Check bladder scan for PVR and Foley catheter placement for residual >300 mL or worsening hematuria  2.  Metastatic prostate cancer Followed by medical oncology and recently started on Orgovyx  3.  History urinary retention Bladder scan for PVR ordered    02/20/2022, 7:09 PM  John Giovanni,  MD

## 2022-02-20 NOTE — ED Triage Notes (Signed)
Pt c/o burning with urination and blood in urine since yesterday.

## 2022-02-20 NOTE — ED Notes (Addendum)
Lab tech unable to see blood culture orders. Blood cultures dc'd then reordered so that lab could see and draw them.

## 2022-02-21 DIAGNOSIS — Z87440 Personal history of urinary (tract) infections: Secondary | ICD-10-CM | POA: Diagnosis not present

## 2022-02-21 DIAGNOSIS — I2510 Atherosclerotic heart disease of native coronary artery without angina pectoris: Secondary | ICD-10-CM | POA: Diagnosis not present

## 2022-02-21 DIAGNOSIS — R310 Gross hematuria: Secondary | ICD-10-CM | POA: Diagnosis not present

## 2022-02-21 DIAGNOSIS — N3091 Cystitis, unspecified with hematuria: Secondary | ICD-10-CM | POA: Insufficient documentation

## 2022-02-21 DIAGNOSIS — D6489 Other specified anemias: Secondary | ICD-10-CM | POA: Diagnosis not present

## 2022-02-21 DIAGNOSIS — I5032 Chronic diastolic (congestive) heart failure: Secondary | ICD-10-CM | POA: Diagnosis not present

## 2022-02-21 DIAGNOSIS — N179 Acute kidney failure, unspecified: Secondary | ICD-10-CM

## 2022-02-21 LAB — CBC WITH DIFFERENTIAL/PLATELET
Abs Immature Granulocytes: 0.02 10*3/uL (ref 0.00–0.07)
Basophils Absolute: 0 10*3/uL (ref 0.0–0.1)
Basophils Relative: 1 %
Eosinophils Absolute: 0.2 10*3/uL (ref 0.0–0.5)
Eosinophils Relative: 3 %
HCT: 26.2 % — ABNORMAL LOW (ref 39.0–52.0)
Hemoglobin: 8.1 g/dL — ABNORMAL LOW (ref 13.0–17.0)
Immature Granulocytes: 0 %
Lymphocytes Relative: 10 %
Lymphs Abs: 0.6 10*3/uL — ABNORMAL LOW (ref 0.7–4.0)
MCH: 27.9 pg (ref 26.0–34.0)
MCHC: 30.9 g/dL (ref 30.0–36.0)
MCV: 90.3 fL (ref 80.0–100.0)
Monocytes Absolute: 0.6 10*3/uL (ref 0.1–1.0)
Monocytes Relative: 10 %
Neutro Abs: 4.9 10*3/uL (ref 1.7–7.7)
Neutrophils Relative %: 76 %
Platelets: 298 10*3/uL (ref 150–400)
RBC: 2.9 MIL/uL — ABNORMAL LOW (ref 4.22–5.81)
RDW: 17.2 % — ABNORMAL HIGH (ref 11.5–15.5)
WBC: 6.4 10*3/uL (ref 4.0–10.5)
nRBC: 0 % (ref 0.0–0.2)

## 2022-02-21 LAB — MAGNESIUM: Magnesium: 2.2 mg/dL (ref 1.7–2.4)

## 2022-02-21 LAB — COMPREHENSIVE METABOLIC PANEL
ALT: 13 U/L (ref 0–44)
AST: 14 U/L — ABNORMAL LOW (ref 15–41)
Albumin: 2.7 g/dL — ABNORMAL LOW (ref 3.5–5.0)
Alkaline Phosphatase: 50 U/L (ref 38–126)
Anion gap: 7 (ref 5–15)
BUN: 20 mg/dL (ref 8–23)
CO2: 24 mmol/L (ref 22–32)
Calcium: 8.4 mg/dL — ABNORMAL LOW (ref 8.9–10.3)
Chloride: 108 mmol/L (ref 98–111)
Creatinine, Ser: 1.09 mg/dL (ref 0.61–1.24)
GFR, Estimated: >60 mL/min (ref >60)
Glucose, Bld: 104 mg/dL — ABNORMAL HIGH (ref 70–99)
Potassium: 4.3 mmol/L (ref 3.5–5.1)
Sodium: 139 mmol/L (ref 135–145)
Total Bilirubin: 0.8 mg/dL (ref 0.3–1.2)
Total Protein: 6.3 g/dL — ABNORMAL LOW (ref 6.5–8.1)

## 2022-02-21 LAB — PHOSPHORUS: Phosphorus: 3.3 mg/dL (ref 2.5–4.6)

## 2022-02-21 MED ORDER — ORAL CARE MOUTH RINSE: 15.0000 mL | OROMUCOSAL | Status: DC | PRN

## 2022-02-21 NOTE — Progress Notes (Signed)
PROGRESS NOTE Cesar Powell  NLG:[id number] DOB: March 14, 1931 DOA: 02/20/2022 PCP: Owens Loffler, MD   Brief Narrative/Hospital Course: 88 year old male with history of prostate cancerstatus post radiation in 2015 on Relugolix, chronic normocytic anemia , coronary artery disease status post stenting on aspirin and Brilinta 05/13/2021, presents with gross hematuria and dysuria. CT renal stone showed no urinary tract calculus or hydronephrosis, diffusely thickened urinary bladder with adjacent fatty stranding noted consistent with nonspecific infectious or inflammatory cystitis and most likely blood product or clot in the bladder lumen.  In the ED and DAPT held cardio was consulted along with urology. Seen by urology advised continued treatment of UTI, continue Foley catheter if residual more than 300 mL and bladder scan.    Subjective: Seen and examined in the ED this morning.  Reports still having ongoing gross hematuria does not have a catheter, had in and out cath done last night due to 100 cc removal, bladder scan stable this am   Assessment and Plan: Principal Problem:   Gross hematuria Active Problems:   UTI (urinary tract infection)   Coronary artery disease involving native coronary artery of native heart   Essential hypertension   Prostate cancer (HCC)   Normocytic anemia   Chronic diastolic CHF (congestive heart failure) (HCC)   Gross hematuria Metastatic prostate cancer status post radiation in 2015 on Relugolix BPH UTI: Admit with gross hematuria with background history of BPH prostate cancer and now with UTI and on brilinta- Continue IV antibiotics -Rocephin, Flomax, follow-up urine culture.  Monitor bladder scan, discuss succumbs/secure chat with urology this morning will be coming to check on him shortly.  Holding Brilinta, maintain hemoglobin of 8 g, cont home Orgovyx for prostate ca,  CAD S/P stenting on aspirin and Brilinta 05/13/2021 Essential  hypertension HLD: Holding Brilinta in the setting of hematuria and resume as soon as possible cardiology following appreciate input, continue aspirin, Lipitor 80 mg  Chronic diastolic CHF: Volume status stable.  AKI -baseline creatinine variable 1.0-1.3 since admission improved to 1.0 Recent Labs  Lab 02/16/22 0924 02/20/22 1103 02/21/22 0429  BUN 30* 33* 20  CREATININE 1.17 1.39* 1.09    Normocytic anemia Acute blood loss anemia: Maintain hemoglobin of 8 g and transfuse if needed Recent Labs  Lab 02/16/22 0924 02/20/22 1103 02/21/22 0429  HGB 8.5* 8.0* 8.1*  HCT 27.3* 26.2* 26.2*    BPH  Incidental findings on CT scan: New sclerotic wedge deformity of L1 vertebral body as well as sclerotic inferior endplate deformity of the T12 vertebral body. Although nonspecific, this is worrisome for a pathologic fracture in the setting of known metastatic prostate malignancy-he has pain or any other signs will need reimaging. -Small umbilical hernia containing fat and a single, nonobstructed loop of mid small bowel.  DVT prophylaxis: SCDs Start: 02/20/22 1535 Code Status:   Code Status: Full Code Family Communication: plan of care discussed with patient at bedside. Patient status is: Inpatient because of ongoing hematuria management Level of care: Progressive   Dispo: The patient is from: home            Anticipated disposition: home 2 days  Mobility Assessment (last 72 hours)     Mobility Assessment   No documentation.            Objective: Vitals last 24 hrs: Vitals:   02/21/22 0600 02/21/22 0700 02/21/22 0800 02/21/22 0812  BP: (!) 148/54 (!) 166/50 (!) 148/54   Pulse: (!) 58 61 66   Resp: 15 18  17   Temp:    97.7 F (36.5 C)  TempSrc:    Oral  SpO2: 92% (!) 85% 98%    Weight change:   Physical Examination: General exam: alert awake,older than stated age, weak appearing. HEENT:Oral mucosa moist, Ear/Nose WNL grossly, dentition normal. Respiratory system:  bilaterally  clear BS, no use of accessory muscle Cardiovascular system: S1 & S2 +, No JVD. Gastrointestinal system: Abdomen soft,NT,ND, BS+ Nervous System:Alert, awake, moving extremities and grossly nonfocal Extremities: LE edema neg,distal peripheral pulses palpable.  Skin: No rashes,no icterus. MSK: Normal muscle bulk,tone, power External catheter present with bloody urine  Medications reviewed:  Scheduled Meds:  sodium chloride   Intravenous Once   aspirin  81 mg Oral Daily   atorvastatin  80 mg Oral q1800   pantoprazole  40 mg Oral BID AC   Relugolix  120 mg Oral Daily   tamsulosin  0.4 mg Oral QPC supper   Continuous Infusions:  sodium chloride     cefTRIAXone (ROCEPHIN)  IV Stopped (02/20/22 1831)      Diet Order             Diet Heart Room service appropriate? Yes; Fluid consistency: Thin  Diet effective now                            Intake/Output Summary (Last 24 hours) at 02/21/2022 1141 Last data filed at 02/21/2022 0436 Gross per 24 hour  Intake 100 ml  Output 1400 ml  Net -1300 ml   Net IO Since Admission: -1,300 mL [02/21/22 1141]  Wt Readings from Last 3 Encounters:  01/30/22 88.8 kg  01/14/22 90.7 kg  01/13/22 88.9 kg     Unresulted Labs (From admission, onward)     Start     Ordered   02/20/22 2018  Urine Culture  (Urine Culture)  Add-on,   AD       Question:  Indication  Answer:  Acute gross hematuria   02/20/22 2017          Data Reviewed: I have personally reviewed following labs and imaging studies CBC: Recent Labs  Lab 02/16/22 0924 02/20/22 1103 02/21/22 0429  WBC 6.4 9.6 6.4  NEUTROABS 5.0  --  4.9  HGB 8.5* 8.0* 8.1*  HCT 27.3* 26.2* 26.2*  MCV 93.5 92.3 90.3  PLT 332 349 161   Basic Metabolic Panel: Recent Labs  Lab 02/16/22 0924 02/20/22 1103 02/21/22 0429  NA 137 139 139  K 4.1 4.5 4.3  CL 108 104 108  CO2 '26 23 24  '$ GLUCOSE 111* 159* 104*  BUN 30* 33* 20  CREATININE 1.17 1.39* 1.09  CALCIUM 8.4*  8.9 8.4*  MG  --   --  2.2  PHOS  --   --  3.3   GFR: CrCl cannot be calculated (Unknown ideal weight.). Liver Function Tests: Recent Labs  Lab 02/16/22 0924 02/21/22 0429  AST 20 14*  ALT 14 13  ALKPHOS 62 50  BILITOT 0.7 0.8  PROT 7.2 6.3*  ALBUMIN 3.2* 2.7*   No results for input(s): "LIPASE", "AMYLASE" in the last 168 hours. No results for input(s): "AMMONIA" in the last 168 hours. Coagulation Profile: No results for input(s): "INR", "PROTIME" in the last 168 hours. BNP (last 3 results) No results for input(s): "PROBNP" in the last 8760 hours. HbA1C: No results for input(s): "HGBA1C" in the last 72 hours. CBG: No results for input(s): "GLUCAP" in the  last 168 hours. Lipid Profile: No results for input(s): "CHOL", "HDL", "LDLCALC", "TRIG", "CHOLHDL", "LDLDIRECT" in the last 72 hours. Thyroid Function Tests: No results for input(s): "TSH", "T4TOTAL", "FREET4", "T3FREE", "THYROIDAB" in the last 72 hours. Sepsis Labs: No results for input(s): "PROCALCITON", "LATICACIDVEN" in the last 168 hours.  Recent Results (from the past 240 hour(s))  Blood culture (routine x 2)     Status: None (Preliminary result)   Collection Time: 02/20/22  7:45 PM   Specimen: BLOOD  Result Value Ref Range Status   Specimen Description BLOOD RIGHT ANTECUBITAL  Final   Special Requests   Final    BOTTLES DRAWN AEROBIC AND ANAEROBIC Blood Culture results may not be optimal due to an excessive volume of blood received in culture bottles   Culture   Final    NO GROWTH < 12 HOURS Performed at [facility], [address] [place], [place] [address]    Report Status PENDING  Incomplete  Blood culture (routine x 2)     Status: None (Preliminary result)   Collection Time: 02/20/22  8:04 PM   Specimen: BLOOD  Result Value Ref Range Status   Specimen Description BLOOD RIGHT ANTECUBITAL  Final   Special Requests   Final    BOTTLES DRAWN AEROBIC ONLY Blood Culture adequate volume   Culture    Final    NO GROWTH < 12 HOURS Performed at [facility], [address] [place], [place] [address]    Report Status PENDING  Incomplete    Antimicrobials: Anti-infectives (From admission, onward)    Start     Dose/Rate Route Frequency Ordered Stop   02/20/22 1645  cefTRIAXone (ROCEPHIN) 2 g in sodium chloride 0.9 % 100 mL IVPB        2 g 200 mL/hr over 30 Minutes Intravenous Every 24 hours 02/20/22 1641        Culture/Microbiology    Component Value Date/Time   SDES BLOOD RIGHT ANTECUBITAL 02/20/2022 2004   SPECREQUEST  02/20/2022 2004    BOTTLES DRAWN AEROBIC ONLY Blood Culture adequate volume   CULT  02/20/2022 2004    NO GROWTH < 12 HOURS Performed at [facility], [address] [place],  [address]    REPTSTATUS PENDING 02/20/2022 2004    Other culture-see note  Radiology Studies: CT Renal Stone Study  Result Date: 02/20/2022 CLINICAL DATA:  Flank pain, kidney stones suspected, hematuria, history of recurrent metastatic prostate cancer * Tracking Code: BO * EXAM: CT ABDOMEN AND PELVIS WITHOUT CONTRAST TECHNIQUE: Multidetector CT imaging of the abdomen and pelvis was performed following the standard protocol without IV contrast. RADIATION DOSE REDUCTION: This exam was performed according to the departmental dose-optimization program which includes automated exposure control, adjustment of the mA and/or kV according to patient size and/or use of iterative reconstruction technique. COMPARISON:  PET-CT, 11/25/2021, CT chest abdomen pelvis, 04/02/2021 FINDINGS: Lower chest: No acute abnormality.  Coronary artery calcifications. Hepatobiliary: No solid liver abnormality is seen. No gallstones, gallbladder wall thickening, or biliary dilatation. Pancreas: Unremarkable. No pancreatic ductal dilatation or surrounding inflammatory changes. Spleen: Normal in size without significant abnormality. Adrenals/Urinary Tract: Adrenal glands are unremarkable. Simple,  benign bilateral renal cortical cysts, for which no further follow-up or characterization is required kidneys are otherwise normal, without renal calculi, solid lesion, or hydronephrosis. Diffusely thickened urinary bladder with adjacent fat stranding and heterogeneously hyperdense material dependently (series 2, image 64). Stomach/Bowel: Stomach is within normal limits. Appendix appears normal. No evidence of bowel  wall thickening, distention, or inflammatory changes. Sigmoid diverticula. Vascular/Lymphatic: Aortic atherosclerosis. Unchanged retroperitoneal lymphadenopathy, index left retroperitoneal node measuring 2.3 x 1.3 cm (series 2, image 32). Reproductive: Prostatomegaly. Probable TURP defect. Unchanged heterogeneous dystrophic calcification within the right aspect of the prostate (series 2, image 76). Other: Small umbilical hernia containing fat and a single, nonobstructed loop of mid small bowel (series 2, image 60). No ascites. Musculoskeletal: New sclerotic wedge deformity of L1 vertebral body (series 6, image 90) as well as sclerotic inferior endplate deformity of the T12 vertebral body (series 6, image 98). Unchanged inferior endplate deformity of L3 and superior endplate deformity of L4 (series 6, image 92). IMPRESSION: 1. No urinary tract calculus or hydronephrosis. 2. Diffusely thickened urinary bladder with adjacent fat stranding and heterogeneously hyperdense material dependently, consistent with nonspecific infectious or inflammatory cystitis and most likely blood product or clot in the bladder lumen. Correlate with urinalysis. Radiation induced cystitis is a differential consideration. 3. Prostatomegaly with probable TURP defect. Unchanged heterogeneous dystrophic calcification within the right aspect of the prostate. 4. Unchanged retroperitoneal lymphadenopathy. 5. New sclerotic wedge deformity of L1 vertebral body as well as sclerotic inferior endplate deformity of the T12 vertebral body.  Although nonspecific, this is worrisome for a pathologic fracture in the setting of known metastatic prostate malignancy. 6. Small umbilical hernia containing fat and a single, nonobstructed loop of mid small bowel. 7. Coronary artery disease. Aortic Atherosclerosis (ICD10-I70.0). Electronically Signed   By: Delanna Ahmadi M.D.   On: 02/20/2022 12:28     LOS: 1 day   Antonieta Pert, MD [facility]  02/21/2022, 11:41 AM

## 2022-02-21 NOTE — Progress Notes (Signed)
Urology Consult Follow Up  Subjective: No complaints.  Voiding without problems Straight cath PVR last night 200 mL Bladder scan PVR this morning 50 mL  Anti-infectives: Anti-infectives (From admission, onward)    Start     Dose/Rate Route Frequency Ordered Stop   02/20/22 1645  cefTRIAXone (ROCEPHIN) 2 g in sodium chloride 0.9 % 100 mL IVPB        2 g 200 mL/hr over 30 Minutes Intravenous Every 24 hours 02/20/22 1641         Current Facility-Administered Medications  Medication Dose Route Frequency Provider Last Rate Last Admin   0.9 %  sodium chloride infusion (Manually program via Guardrails IV Fluids)   Intravenous Once [place], Carole N, DO       0.9 %  sodium chloride infusion   Intravenous Continuous Hall, Carole N, DO       acetaminophen (TYLENOL) tablet 650 mg  650 mg Oral Q6H PRN Irene Pap N, DO       aspirin chewable tablet 81 mg  81 mg Oral Daily Kayleen Memos, DO   81 mg at 02/21/22 0820   atorvastatin (LIPITOR) tablet 80 mg  80 mg Oral q1800 Irene Pap N, DO       cefTRIAXone (ROCEPHIN) 2 g in sodium chloride 0.9 % 100 mL IVPB  2 g Intravenous Q24H Irene Pap N, DO   Stopped at 02/20/22 1831   melatonin tablet 5 mg  5 mg Oral QHS PRN Irene Pap N, DO       pantoprazole (PROTONIX) EC tablet 40 mg  40 mg Oral BID AC Hall, Carole N, DO   40 mg at 02/21/22 0820   polyethylene glycol (MIRALAX / GLYCOLAX) packet 17 g  17 g Oral Daily PRN Irene Pap N, DO   17 g at 02/21/22 1025   prochlorperazine (COMPAZINE) injection 10 mg  10 mg Intravenous Q6H PRN Kayleen Memos, DO       Relugolix TABS 120 mg  120 mg Oral Daily [place], Carole N, DO       tamsulosin (FLOMAX) capsule 0.4 mg  0.4 mg Oral QPC supper Hall, Carole N, DO   0.4 mg at 02/20/22 1706   Current Outpatient Medications  Medication Sig Dispense Refill   aspirin 81 MG chewable tablet Chew 1 tablet (81 mg total) by mouth daily.     atorvastatin (LIPITOR) 80 MG tablet Take 1 tablet (80 mg total) by mouth daily.  90 tablet 3   Cholecalciferol (VITAMIN D3) 50 MCG (2000 UT) TABS Take 4,000 Units by mouth daily.     isosorbide mononitrate (IMDUR) 30 MG 24 hr tablet Take 15 mg by mouth daily.     losartan (COZAAR) 25 MG tablet Take 0.5 tablets (12.5 mg total) by mouth daily. 45 tablet 3   pantoprazole (PROTONIX) 40 MG tablet Take 1 tablet (40 mg total) by mouth 2 (two) times daily before a meal. 60 tablet 0   Relugolix 120 MG TABS Take by mouth. Take 3 tablets (360 mg) by mouth as a loading dose on day 1, followed by 1 tablet (120 mg) once daily.     tamsulosin (FLOMAX) 0.4 MG CAPS capsule Take 0.4 mg by mouth daily after supper.     ticagrelor (BRILINTA) 90 MG TABS tablet Take 1 tablet (90 mg total) by mouth 2 (two) times daily. 180 tablet 3   nitroGLYCERIN (NITROSTAT) 0.4 MG SL tablet Place 0.4 mg under the tongue every 5 (five) minutes as needed  for chest pain.       Objective: Vital signs in last 24 hours: Temp:  [97.7 F (36.5 C)-98.9 F (37.2 C)] 97.7 F (36.5 C) (07/22 0812) Pulse Rate:  [57-75] 66 (07/22 0800) Resp:  [15-21] 17 (07/22 0800) BP: (123-166)/(47-62) 148/54 (07/22 0800) SpO2:  [85 %-100 %] 98 % (07/22 0800)  Intake/Output from previous day: 07/21 0701 - 07/22 0700 In: 100 [IV Piggyback:100] Out: 1400 [Urine:1400] Intake/Output this shift: No intake/output data recorded.   Physical Exam PureWick canister with darker blood consistent with old blood  Lab Results:  Recent Labs    02/20/22 1103 02/21/22 0429  WBC 9.6 6.4  HGB 8.0* 8.1*  HCT 26.2* 26.2*  PLT 349 298   BMET Recent Labs    02/20/22 1103 02/21/22 0429  NA 139 139  K 4.5 4.3  CL 104 108  CO2 23 24  GLUCOSE 159* 104*  BUN 33* 20  CREATININE 1.39* 1.09  CALCIUM 8.9 8.4*    Studies/Results: CT Renal Stone Study  Result Date: 02/20/2022 CLINICAL DATA:  Flank pain, kidney stones suspected, hematuria, history of recurrent metastatic prostate cancer * Tracking Code: BO * EXAM: CT ABDOMEN AND PELVIS  WITHOUT CONTRAST TECHNIQUE: Multidetector CT imaging of the abdomen and pelvis was performed following the standard protocol without IV contrast. RADIATION DOSE REDUCTION: This exam was performed according to the departmental dose-optimization program which includes automated exposure control, adjustment of the mA and/or kV according to patient size and/or use of iterative reconstruction technique. COMPARISON:  PET-CT, 11/25/2021, CT chest abdomen pelvis, 04/02/2021 FINDINGS: Lower chest: No acute abnormality.  Coronary artery calcifications. Hepatobiliary: No solid liver abnormality is seen. No gallstones, gallbladder wall thickening, or biliary dilatation. Pancreas: Unremarkable. No pancreatic ductal dilatation or surrounding inflammatory changes. Spleen: Normal in size without significant abnormality. Adrenals/Urinary Tract: Adrenal glands are unremarkable. Simple, benign bilateral renal cortical cysts, for which no further follow-up or characterization is required kidneys are otherwise normal, without renal calculi, solid lesion, or hydronephrosis. Diffusely thickened urinary bladder with adjacent fat stranding and heterogeneously hyperdense material dependently (series 2, image 64). Stomach/Bowel: Stomach is within normal limits. Appendix appears normal. No evidence of bowel wall thickening, distention, or inflammatory changes. Sigmoid diverticula. Vascular/Lymphatic: Aortic atherosclerosis. Unchanged retroperitoneal lymphadenopathy, index left retroperitoneal node measuring 2.3 x 1.3 cm (series 2, image 32). Reproductive: Prostatomegaly. Probable TURP defect. Unchanged heterogeneous dystrophic calcification within the right aspect of the prostate (series 2, image 76). Other: Small umbilical hernia containing fat and a single, nonobstructed loop of mid small bowel (series 2, image 60). No ascites. Musculoskeletal: New sclerotic wedge deformity of L1 vertebral body (series 6, image 90) as well as sclerotic  inferior endplate deformity of the T12 vertebral body (series 6, image 98). Unchanged inferior endplate deformity of L3 and superior endplate deformity of L4 (series 6, image 92). IMPRESSION: 1. No urinary tract calculus or hydronephrosis. 2. Diffusely thickened urinary bladder with adjacent fat stranding and heterogeneously hyperdense material dependently, consistent with nonspecific infectious or inflammatory cystitis and most likely blood product or clot in the bladder lumen. Correlate with urinalysis. Radiation induced cystitis is a differential consideration. 3. Prostatomegaly with probable TURP defect. Unchanged heterogeneous dystrophic calcification within the right aspect of the prostate. 4. Unchanged retroperitoneal lymphadenopathy. 5. New sclerotic wedge deformity of L1 vertebral body as well as sclerotic inferior endplate deformity of the T12 vertebral body. Although nonspecific, this is worrisome for a pathologic fracture in the setting of known metastatic prostate malignancy. 6. Small umbilical hernia  containing fat and a single, nonobstructed loop of mid small bowel. 7. Coronary artery disease. Aortic Atherosclerosis (ICD10-I70.0). Electronically Signed   By: Delanna Ahmadi M.D.   On: 02/20/2022 12:28     Assessment: No evidence of active bleeding Good bladder emptying with residual 50 mL  Recommendations: No need for Foley catheter placement at this time Continue present antibiotic therapy pending culture report      LOS: 1 day    Cesar Powell 02/21/2022

## 2022-02-21 NOTE — ED Notes (Signed)
Pt asleep in bed at this time, call bell within reach

## 2022-02-21 NOTE — Hospital Course (Addendum)
88 year old male with history of prostate cancerstatus post radiation in 2015 on Relugolix, chronic normocytic anemia , coronary artery disease status post stenting on aspirin and Brilinta 05/13/2021, presents with gross hematuria and dysuria. CT renal stone showed no urinary tract calculus or hydronephrosis, diffusely thickened urinary bladder with adjacent fatty stranding noted consistent with nonspecific infectious or inflammatory cystitis and most likely blood product or clot in the bladder lumen.  In the ED and DAP[facility] held cardio was consulted along with urology. Seen by urology advised continued treatment of UTI, continue Foley catheter if residual more than 300 mL and bladder scan.

## 2022-02-21 NOTE — Consult Note (Signed)
Cardiology Consultation:   Patient ID: Cesar Powell MRN: [id number]; DOB: February 02, 1931  Admit date: 02/20/2022 Date of Consult: 02/21/2022  PCP:  Owens Loffler, MD   [facility] Providers Cardiologist:  None   {   Patient Profile:   Cesar Powell is a 88 y.o. male with a hx of CAD with inferior STEMI s/p DES RCA in 05/2021, HFpEF, HTN, metastatic prostate cancer, OSA, chronic anemia who is being seen 02/21/2022 for the evaluation of anticoagulation in the setting of anemia at the request of Dr. Nevada Crane.  History of Present Illness:   Cesar Powell is followed by Dr. Rockey Situ for the above cardiac issues.   He was admitted 05/2021 for syncope, chest tightness and lightheadedness. EMS was called who found him to have STEMI int he setting of hypotension and bradycardia. He was brought to [facility] where ryhtm was junctional. Emergent CT was unremarkable and he was transferred to the cath lab. Cath showed multivessel CAD. Culprit lesion was thrombotic 95% pRCA stenosis with clot embolization into distal branches. This was successfully treated with primary PCI. He was started on DAPT with ASA and Brilinta for 12 months. Echo showed LVEF 555, no WMA, G1DD, mild MS. BB was held for bradycardia.   Last seen 08/25/21 and was doing well from a cardiac perspective. He was dealing with chronic anemia and hematuria  The patient presented to the ER 7/21 for hematuria. Last does of ASA and Birlinta was 7/20. He reports hematuria started 2 days ago. He was having trouble urinating as well, felt burning when he was able to. Denies chest pain, SOB, LLE, orthopnea, or pnd.  BP 130/51, pulse 90, afebrile, 97%O2. Hgb came back at 8CT renal study showed no urinary stones or hydronephrosis, possible infected/inflamed cystitis, possible blood clot in the lumen. Urology was consulted. UA showed RBCs and WBCs. Scr 1.39, BUN 33. Case discussed with cardiology and Brilinta was held and he was admitted.    Past Medical  History:  Diagnosis Date   Coronary artery disease involving native coronary artery of native heart 08/25/2021   H/O prostate cancer    was treated with radiation   Hypertension    Local recurrence of prostate cancer ([place]) 06/21/2004   Qualifier: Diagnosis of  By: Fuller Plan CMA (AAMA), [place]     Myocardial infarction ([place]) 2022   Obstructive sleep apnea 06/22/2007   NPSG [place] 1979 Unattended Home sleep Study- 05/10/14- Confirms severe OSA, AHI 41.8/ hr, weight 230 pounds CPAP and also Transcend portable CPAP auto 8-15     Sleep apnea 1977   uses C-pap     Past Surgical History:  Procedure Laterality Date   ABDOMINAL SURGERY     ruptured intestines    CATARACT EXTRACTION W/PHACO Left 01/29/2016   Procedure: CATARACT EXTRACTION PHACO AND INTRAOCULAR LENS PLACEMENT ([place]) left eye;  Surgeon: Leandrew Koyanagi, MD;  Location: [place];  Service: Ophthalmology;  Laterality: Left;  RESTOR LENS   CATARACT EXTRACTION W/PHACO Right 10/21/2016   Procedure: CATARACT EXTRACTION PHACO AND INTRAOCULAR LENS PLACEMENT ([facility])  Right restor toric lens;  Surgeon: Leandrew Koyanagi, MD;  Location: [place];  Service: Ophthalmology;  Laterality: Right;  restor toric lens   CORONARY/GRAFT ACUTE MI REVASCULARIZATION N/A 05/13/2021   Procedure: Coronary/Graft Acute MI Revascularization;  Surgeon: Nelva Bush, MD;  Location: [place] [facility];  Service: Cardiovascular;  Laterality: N/A;   CYSTOSCOPY WITH LITHOLAPAXY N/A 08/14/2016   Procedure: CYSTOSCOPY WITH LITHOLAPAXY;  Surgeon: Nickie Retort, MD;  Location: [facility];  Service: Urology;  Laterality: N/A;   FEMUR IM NAIL Right 10/08/2014   Procedure: INTRAMEDULLARY (IM) NAIL FEMORAL;  Surgeon: Rod Can, MD;  Location: [facility];  Service: Orthopedics;  Laterality: Right;  PER DANIELLE 110 MIN   HOLMIUM LASER APPLICATION  11/27/621   Procedure: HOLMIUM LASER APPLICATION;  Surgeon: Nickie Retort, MD;  Location: [facility];   Service: Urology;;   LEFT HEART CATH AND CORONARY ANGIOGRAPHY N/A 05/13/2021   Procedure: LEFT HEART CATH AND CORONARY ANGIOGRAPHY;  Surgeon: Nelva Bush, MD;  Location: [place] [facility];  Service: Cardiovascular;  Laterality: N/A;   OTHER SURGICAL HISTORY  2006   Prostate Radiation Surgery    RADIOACTIVE SEED IMPLANT N/A 10/30/2019   Procedure: RADIOACTIVE SEED IMPLANT/BRACHYTHERAPY IMPLANT;  Surgeon: Billey Co, MD;  Location: [facility];  Service: Urology;  Laterality: N/A;  41 seeds implanted     Home Medications:  Prior to Admission medications   Medication Sig Start Date End Date Taking? Authorizing Provider  aspirin 81 MG chewable tablet Chew 1 tablet (81 mg total) by mouth daily. 05/16/21 05/16/22 Yes Jennye Boroughs, MD  atorvastatin (LIPITOR) 80 MG tablet Take 1 tablet (80 mg total) by mouth daily. 08/25/21  Yes Gollan, Kathlene November, MD  Cholecalciferol (VITAMIN D3) 50 MCG (2000 UT) TABS Take 4,000 Units by mouth daily.   Yes [provider]  isosorbide mononitrate (IMDUR) 30 MG 24 hr tablet Take 15 mg by mouth daily. 11/14/21  Yes [provider]  losartan (COZAAR) 25 MG tablet Take 0.5 tablets (12.5 mg total) by mouth daily. 08/25/21  Yes Minna Merritts, MD  pantoprazole (PROTONIX) 40 MG tablet Take 1 tablet (40 mg total) by mouth 2 (two) times daily before a meal. 12/13/21  Yes Amin, Ankit Chirag, MD  Relugolix 120 MG TABS Take by mouth. Take 3 tablets (360 mg) by mouth as a loading dose on day 1, followed by 1 tablet (120 mg) once daily.   Yes Sindy Guadeloupe, MD  tamsulosin (FLOMAX) 0.4 MG CAPS capsule Take 0.4 mg by mouth daily after supper.   Yes [provider]  ticagrelor (BRILINTA) 90 MG TABS tablet Take 1 tablet (90 mg total) by mouth 2 (two) times daily. 08/21/21  Yes Taniya Dasher H, PA-C  nitroGLYCERIN (NITROSTAT) 0.4 MG SL tablet Place 0.4 mg under the tongue every 5 (five) minutes as needed for chest pain.    [provider]     Inpatient Medications: Scheduled Meds:  sodium chloride   Intravenous Once   aspirin  81 mg Oral Daily   atorvastatin  80 mg Oral q1800   pantoprazole  40 mg Oral BID AC   Relugolix  120 mg Oral Daily   tamsulosin  0.4 mg Oral QPC supper   Continuous Infusions:  sodium chloride     cefTRIAXone (ROCEPHIN)  IV Stopped (02/20/22 1831)   PRN Meds: acetaminophen, melatonin, polyethylene glycol, prochlorperazine  Allergies:   No Known Allergies  Social History:   Social History   Socioeconomic History   Marital status: Married    Spouse name: Not on file   Number of children: Not on file   Years of education: Not on file   Highest education level: Not on file  Occupational History   Occupation: retired  Tobacco Use   Smoking status: Former    Packs/day: 1.00    Years: 15.00    Total pack years: 15.00    Types: Cigarettes  Quit date: 09/14/1965    Years since quitting: 56.4    Passive exposure: Past   Smokeless tobacco: Never  Vaping Use   Vaping Use: Never used  Substance and Sexual Activity   Alcohol use: Yes    Alcohol/week: 7.0 standard drinks of alcohol    Types: 7 Glasses of wine per week   Drug use: No   Sexual activity: Yes    Birth control/protection: None  Other Topics Concern   Not on file  Social History Narrative   Not on file   Social Determinants of Health   Financial Resource Strain: Low Risk  (12/05/2021)   Overall Financial Resource Strain (CARDIA)    Difficulty of Paying Living Expenses: Not hard at all  Food Insecurity: No Food Insecurity (12/05/2021)   Hunger Vital Sign    Worried About Running Out of Food in the Last Year: Never true    Ran Out of Food in the Last Year: Never true  Transportation Needs: No Transportation Needs (12/05/2021)   PRAPARE - Hydrologist (Medical): No    Lack of Transportation (Non-Medical): No  Physical Activity: Insufficiently Active (12/05/2021)   Exercise Vital Sign    Days of  Exercise per Week: 5 days    Minutes of Exercise per Session: 10 min  Stress: No Stress Concern Present (12/05/2021)   [place]    Feeling of Stress : Not at all  Social Connections: Moderately Isolated (12/05/2021)   Social Connection and Isolation Panel [NHANES]    Frequency of Communication with Friends and Family: More than three times a week    Frequency of Social Gatherings with Friends and Family: More than three times a week    Attends Religious Services: Never    Marine scientist or Organizations: No    Attends Archivist Meetings: Never    Marital Status: Married  Human resources officer Violence: Not At Risk (12/05/2021)   Humiliation, Afraid, Rape, and Kick questionnaire    Fear of Current or Ex-Partner: No    Emotionally Abused: No    Physically Abused: No    Sexually Abused: No    Family History:    Family History  Problem Relation Age of Onset   Heart disease Brother    Heart attack Brother    Lung cancer Brother    Prostate cancer Neg Hx    Bladder Cancer Neg Hx    Kidney cancer Neg Hx      ROS:  Please see the history of present illness.   All other ROS reviewed and negative.     Physical Exam/Data:   Vitals:   02/20/22 2230 02/21/22 0030 02/21/22 0330 02/21/22 0600  BP: (!) 123/55 (!) 123/53 (!) 144/47 (!) 148/54  Pulse: 60 (!) 59 (!) 57 (!) 58  Resp: 17 (!) '21 15 15  '$ Temp:      TempSrc:      SpO2: 98% 91% 98% 92%    Intake/Output Summary (Last 24 hours) at 02/21/2022 0658 Last data filed at 02/21/2022 0436 Gross per 24 hour  Intake 100 ml  Output 1400 ml  Net -1300 ml      01/30/2022    9:33 AM 01/14/2022    2:08 PM 01/13/2022    9:42 AM  Last 3 Weights  Weight (lbs) 195 lb 12.8 oz 200 lb 1 oz 195 lb 14.4 oz  Weight (kg) 88.814 kg 90.748 kg 88.86 kg  There is no height or weight on file to calculate BMI.  General:  Well nourished, well developed, in no acute  distress HEENT: normal Neck: no JVD Vascular: No carotid bruits; Distal pulses 2+ bilaterally Cardiac:  normal S1, S2; RRR; no murmur  Lungs:  clear to auscultation bilaterally, no wheezing, rhonchi or rales  Abd: soft, nontender, no hepatomegaly  Ext: no edema Musculoskeletal:  No deformities, BUE and BLE strength normal and equal Skin: warm and dry  Neuro:  CNs 2-12 intact, no focal abnormalities noted Psych:  Normal affect   EKG:  The EKG was personally reviewed and demonstrates:  pending Telemetry:  Telemetry was personally reviewed and demonstrates:  NSR 50s, PVCs, Ventricular bigeminy  Relevant CV Studies:  Echo 05/15/21  1. Left ventricular ejection fraction, by estimation, is 55 %. The left  ventricle has normal function. The left ventricle has no regional wall  motion abnormalities. Left ventricular diastolic parameters are consistent  with Grade I diastolic dysfunction  (impaired relaxation).   2. Right ventricular systolic function is normal. The right ventricular  size is normal. There is normal pulmonary artery systolic pressure. The  estimated right ventricular systolic pressure is 46.5 mmHg.   3. Left atrial size was moderately dilated.   4. Right atrial size was moderately dilated.   5. The mitral valve is normal in structure. No evidence of mitral valve  regurgitation. Mild mitral stenosis.    LHC 05/13/21 Conclusions: Severe multivessel coronary artery disease, as detailed below.  The culprit lesion for the patient's inferior STEMI is a ruptured plaque with thrombus formation in the proximal RCA leading to 95% stenosis and distal embolization of clot. There is moderate-severe LAD and LCx disease that appears chronic. Mildly elevated left ventricular filling pressure (LVEDP 20 mmHg). Successful PCI to the proximal RCA using Onyx Frontier 3.0 x 15 mm drug-eluting stent (postdilated to 3.4 mm) with 0% residual stenosis and TIMI-3 flow. Right radial artery spasm  precluding advancement of 39F guide catheter.  Consider alternative access if interventions are needed in the future. Recommendations: Dual antiplatelet therapy with aspirin and ticagrelor for at least 12 months. Favor medical management of residual LCx, LAD, and RCA disease unless the patient has refractory angina. Aggressive secondary prevention. Close monitoring of hemoglobin.  Will need to consider PRBC transfusion for symptomatic anemia or significant drops in hemoglobin, though I would ideally like to avoid transfusion given associated risk for stent thrombosis. Obtain echocardiogram.  Left Heart   Left Ventricle LV end diastolic pressure is mildly elevated. LVEDP 20 mmHg.  Aortic Valve There is no aortic valve stenosis.    Coronary Diagrams   Diagnostic Dominance: Right Intervention          Laboratory Data:  High Sensitivity Troponin:  No results for input(s): "TROPONINIHS" in the last 720 hours.   Chemistry Recent Labs  Lab 02/16/22 0924 02/20/22 1103 02/21/22 0429  NA 137 139 139  K 4.1 4.5 4.3  CL 108 104 108  CO2 '26 23 24  '$ GLUCOSE 111* 159* 104*  BUN 30* 33* 20  CREATININE 1.17 1.39* 1.09  CALCIUM 8.4* 8.9 8.4*  MG  --   --  2.2  GFRNONAA 60* 48* >60  ANIONGAP 3* 12 7    Recent Labs  Lab 02/16/22 0924 02/21/22 0429  PROT 7.2 6.3*  ALBUMIN 3.2* 2.7*  AST 20 14*  ALT 14 13  ALKPHOS 62 50  BILITOT 0.7 0.8   Lipids No results for input(s): "CHOL", "TRIG", "HDL", "  LABVLDL", "LDLCALC", "CHOLHDL" in the last 168 hours.  Hematology Recent Labs  Lab 02/16/22 0924 02/20/22 1103 02/21/22 0429  WBC 6.4 9.6 6.4  RBC 2.92* 2.84* 2.90*  HGB 8.5* 8.0* 8.1*  HCT 27.3* 26.2* 26.2*  MCV 93.5 92.3 90.3  MCH 29.1 28.2 27.9  MCHC 31.1 30.5 30.9  RDW 16.6* 16.7* 17.2*  PLT 332 349 298   Thyroid No results for input(s): "TSH", "FREET4" in the last 168 hours.  BNPNo results for input(s): "BNP", "PROBNP" in the last 168 hours.  DDimer No results for  input(s): "DDIMER" in the last 168 hours.   Radiology/Studies:  CT Renal Stone Study  Result Date: 02/20/2022 CLINICAL DATA:  Flank pain, kidney stones suspected, hematuria, history of recurrent metastatic prostate cancer * Tracking Code: BO * EXAM: CT ABDOMEN AND PELVIS WITHOUT CONTRAST TECHNIQUE: Multidetector CT imaging of the abdomen and pelvis was performed following the standard protocol without IV contrast. RADIATION DOSE REDUCTION: This exam was performed according to the departmental dose-optimization program which includes automated exposure control, adjustment of the mA and/or kV according to patient size and/or use of iterative reconstruction technique. COMPARISON:  PET-CT, 11/25/2021, CT chest abdomen pelvis, 04/02/2021 FINDINGS: Lower chest: No acute abnormality.  Coronary artery calcifications. Hepatobiliary: No solid liver abnormality is seen. No gallstones, gallbladder wall thickening, or biliary dilatation. Pancreas: Unremarkable. No pancreatic ductal dilatation or surrounding inflammatory changes. Spleen: Normal in size without significant abnormality. Adrenals/Urinary Tract: Adrenal glands are unremarkable. Simple, benign bilateral renal cortical cysts, for which no further follow-up or characterization is required kidneys are otherwise normal, without renal calculi, solid lesion, or hydronephrosis. Diffusely thickened urinary bladder with adjacent fat stranding and heterogeneously hyperdense material dependently (series 2, image 64). Stomach/Bowel: Stomach is within normal limits. Appendix appears normal. No evidence of bowel wall thickening, distention, or inflammatory changes. Sigmoid diverticula. Vascular/Lymphatic: Aortic atherosclerosis. Unchanged retroperitoneal lymphadenopathy, index left retroperitoneal node measuring 2.3 x 1.3 cm (series 2, image 32). Reproductive: Prostatomegaly. Probable TURP defect. Unchanged heterogeneous dystrophic calcification within the right aspect of the  prostate (series 2, image 76). Other: Small umbilical hernia containing fat and a single, nonobstructed loop of mid small bowel (series 2, image 60). No ascites. Musculoskeletal: New sclerotic wedge deformity of L1 vertebral body (series 6, image 90) as well as sclerotic inferior endplate deformity of the T12 vertebral body (series 6, image 98). Unchanged inferior endplate deformity of L3 and superior endplate deformity of L4 (series 6, image 92). IMPRESSION: 1. No urinary tract calculus or hydronephrosis. 2. Diffusely thickened urinary bladder with adjacent fat stranding and heterogeneously hyperdense material dependently, consistent with nonspecific infectious or inflammatory cystitis and most likely blood product or clot in the bladder lumen. Correlate with urinalysis. Radiation induced cystitis is a differential consideration. 3. Prostatomegaly with probable TURP defect. Unchanged heterogeneous dystrophic calcification within the right aspect of the prostate. 4. Unchanged retroperitoneal lymphadenopathy. 5. New sclerotic wedge deformity of L1 vertebral body as well as sclerotic inferior endplate deformity of the T12 vertebral body. Although nonspecific, this is worrisome for a pathologic fracture in the setting of known metastatic prostate malignancy. 6. Small umbilical hernia containing fat and a single, nonobstructed loop of mid small bowel. 7. Coronary artery disease. Aortic Atherosclerosis (ICD10-I70.0). Electronically Signed   By: Delanna Ahmadi M.D.   On: 02/20/2022 12:28     Assessment and Plan:   Acute blood loss anemia Hematuria Urinary retention UTI H/o prostate cancer on immunosuppression - presented with gross hematuria. Hgb 8 with baseline Hgb around  9 - s/p 1 unit PRBCs - urology consulted, suspect hematuria 2/2 UTI and a/c. May do a washout - s/p catheter - Brilinta held - continue to monitor H&H - abx per IM  CAD s/p PCI 05/2021 - per MD recommendations Brilinta held for  hematuria/anemia, and ASA continued  - continue to trend Hgb - no chest pain reported - plan to resume Brilinta once hematuria/anemia has resolved/stabilized  AKI - improved with gentle diuresis  HFpEF - echo 05/2021 showed LVEF 50% G1DD - euvolemic on exam - monitor with IVF   For questions or updates, please contact [place] Please consult [email or web address] for contact info under    Signed, Val Farnam Ninfa Meeker, PA-C  02/21/2022 6:58 AM

## 2022-02-22 DIAGNOSIS — I10 Essential (primary) hypertension: Secondary | ICD-10-CM | POA: Diagnosis not present

## 2022-02-22 DIAGNOSIS — D6489 Other specified anemias: Secondary | ICD-10-CM | POA: Diagnosis not present

## 2022-02-22 LAB — CBC
HCT: 25.6 % — ABNORMAL LOW (ref 39.0–52.0)
Hemoglobin: 8.2 g/dL — ABNORMAL LOW (ref 13.0–17.0)
MCH: 28.2 pg (ref 26.0–34.0)
MCHC: 32 g/dL (ref 30.0–36.0)
MCV: 88 fL (ref 80.0–100.0)
Platelets: 311 10*3/uL (ref 150–400)
RBC: 2.91 MIL/uL — ABNORMAL LOW (ref 4.22–5.81)
RDW: 16.8 % — ABNORMAL HIGH (ref 11.5–15.5)
WBC: 5.9 10*3/uL (ref 4.0–10.5)
nRBC: 0 % (ref 0.0–0.2)

## 2022-02-22 LAB — TYPE AND SCREEN
ABO/RH(D): A POS
Antibody Screen: NEGATIVE
Unit division: 0
Unit division: 0

## 2022-02-22 LAB — BPAM RBC
Blood Product Expiration Date: 202308192359
Blood Product Expiration Date: 202308192359
ISSUE DATE / TIME: 202307211941
Unit Type and Rh: 6200
Unit Type and Rh: 6200

## 2022-02-22 LAB — BASIC METABOLIC PANEL
Anion gap: 7 (ref 5–15)
BUN: 18 mg/dL (ref 8–23)
CO2: 24 mmol/L (ref 22–32)
Calcium: 8.6 mg/dL — ABNORMAL LOW (ref 8.9–10.3)
Chloride: 108 mmol/L (ref 98–111)
Creatinine, Ser: 1.08 mg/dL (ref 0.61–1.24)
GFR, Estimated: >60 mL/min (ref >60)
Glucose, Bld: 102 mg/dL — ABNORMAL HIGH (ref 70–99)
Potassium: 4.2 mmol/L (ref 3.5–5.1)
Sodium: 139 mmol/L (ref 135–145)

## 2022-02-22 NOTE — Progress Notes (Addendum)
Progress Note   Patient: Cesar Powell NGE:[id number] DOB: 04/25/1931 DOA: 02/20/2022     2 DOS: the patient was seen and examined on 02/22/2022   Brief hospital course: 88 year old male with history of prostate cancerstatus post radiation in 2015 on Relugolix, chronic normocytic anemia , coronary artery disease status post stenting on aspirin and Brilinta 05/13/2021, presents with gross hematuria and dysuria. CT renal stone showed no urinary tract calculus or hydronephrosis, diffusely thickened urinary bladder with adjacent fatty stranding noted consistent with nonspecific infectious or inflammatory cystitis and most likely blood product or clot in the bladder lumen.  In the ED and DAPT held cardio was consulted along with urology. Seen by urology advised continued treatment of UTI, continue Foley catheter if residual more than 300 mL and bladder scan.  Assessment and Plan:  Gross hematuria Metastatic prostate cancer status post radiation in 2015 on Relugolix BPH UTI Admit with gross hematuria with background history of BPH prostate cancer and now with UTI and on brilinta. Still occasionally having some blood in urine but significantly improved.  -continue IV abx with CRO 2 g q24h -urine culture still pending  -flomax -Holding Brilinta, maintain hemoglobin of 8 g -no longer taking Orgovyx for prostate CA (makes him drowsy) -urology following   CAD S/P stenting for RCA STEMI 05/13/2021 Holding Brilinta in the setting of hematuria and resume as soon as possible. -Cardiology consulted -continue ASA  -holding brilinta  -not on BB for bradycardia  -Lipitor 80 mg  Essential hypertension Above goal while in hospital initially [place], today has been in the 140s. If persistently elevated in 140s may consider starting losartan 25 mg.   Normocytic anemia Acute blood loss anemia: Maintain hemoglobin of 8 g and transfuse if needed   AKI, resolved   Incidental findings on CT scan: New  sclerotic wedge deformity of L1 vertebral body as well as sclerotic inferior endplate deformity of the T12 vertebral body. Although nonspecific, this is worrisome for a pathologic fracture in the setting of known metastatic prostate malignancy-he has pain or any other signs will need reimaging. -Small umbilical hernia containing fat and a single, nonobstructed loop of mid small bowel.   DVT prophylaxis: SCDs Start: 02/20/22 1535 Code Status:   Code Status: Full Code Family Communication: plan of care discussed with patient at bedside. Patient status is: Inpatient because of ongoing hematuria management Level of care: Progressive      Subjective: Pt feels well, no abd pain, flank pain, fever, chills, burning with urination. Does not take BP medication at home.   Physical Exam: Vitals:   02/22/22 0404 02/22/22 0456 02/22/22 0500 02/22/22 0739  BP: (!) 146/56   (!) 144/59  Pulse: 60   63  Resp: 19   15  Temp: (!) 97.5 F (36.4 C) 98.6 F (37 C)  97.6 F (36.4 C)  TempSrc:  Oral  Oral  SpO2: 97%   99%  Weight:   90.8 kg   Height:       Physical Exam Vitals and nursing note reviewed.  Constitutional:      General: He is not in acute distress.    Appearance: He is not diaphoretic.  HENT:     Head: Atraumatic.  Eyes:     Pupils: Pupils are equal, round, and reactive to light.  Cardiovascular:     Rate and Rhythm: Normal rate and regular rhythm.     Pulses: Normal pulses.     Heart sounds: No murmur heard. Pulmonary:  Effort: Pulmonary effort is normal. No respiratory distress.     Breath sounds: Normal breath sounds. No rales.  Abdominal:     General: Abdomen is flat. There is no distension.     Palpations: Abdomen is soft.     Tenderness: There is no abdominal tenderness. There is no rebound.  Musculoskeletal:     Right lower leg: No edema.     Left lower leg: No edema.  Skin:    General: Skin is warm and dry.     Capillary Refill: Capillary refill takes less  than 2 seconds.  Neurological:     Mental Status: He is alert and oriented to person, place, and time. Mental status is at baseline.  Psychiatric:        Mood and Affect: Mood normal.     Data Reviewed:      Latest Ref Rng & Units 02/22/2022    5:29 AM 02/21/2022    4:29 AM 02/20/2022   11:03 AM  CBC  WBC 4.0 - 10.5 K/uL 5.9  6.4  9.6   Hemoglobin 13.0 - 17.0 g/dL 8.2  8.1  8.0   Hematocrit 39.0 - 52.0 % 25.6  26.2  26.2   Platelets 150 - 400 K/uL 311  298  349       Latest Ref Rng & Units 02/22/2022    5:29 AM 02/21/2022    4:29 AM 02/20/2022   11:03 AM  BMP  Glucose 70 - 99 mg/dL 102  104  159   BUN 8 - 23 mg/dL 18  20  33   Creatinine 0.61 - 1.24 mg/dL 1.08  1.09  1.39   Sodium 135 - 145 mmol/L 139  139  139   Potassium 3.5 - 5.1 mmol/L 4.2  4.3  4.5   Chloride 98 - 111 mmol/L 108  108  104   CO2 22 - 32 mmol/L '24  24  23   '$ Calcium 8.9 - 10.3 mg/dL 8.6  8.4  8.9    Urine culture pending  Family Communication: not at bedside during around  Disposition: Status is: Inpatient Remains inpatient appropriate because: complicated UTI  Planned Discharge Destination: Home    Time spent: 30 minutes  Author: Lorelei Pont, MD 02/22/2022 11:52 AM  For on call review [email or web address].

## 2022-02-22 NOTE — TOC Initial Note (Signed)
Transition of Care [facility]) - Initial/Assessment Note    Patient Details  Name: Cesar Powell MRN: [id number] Date of Birth: 07/04/1931  Transition of Care [facility]) CM/SW Contact:    Beverly Sessions, RN Phone Number: 02/22/2022, 2:22 PM  Clinical Narrative:                      Admitted for: hematuria  Admitted from: home with wife WUJ:[id number] [facility] Current [facility] health/prior home health/DME: RW and cane  Patient has been open with [facility] home health in the past.  States he does not feel home health is indicated this admission  Son to transport at discharge     Patient Goals and CMS Choice        Expected Discharge Plan and Services                                                Prior Living Arrangements/Services                       Activities of Daily Living Home Assistive Devices/Equipment: None ADL Screening (condition at time of admission) Patient's cognitive ability adequate to safely complete daily activities?: Yes Is the patient deaf or have difficulty hearing?: No Does the patient have difficulty seeing, even when wearing glasses/contacts?: No Does the patient have difficulty concentrating, remembering, or making decisions?: No Patient able to express need for assistance with ADLs?: Yes Does the patient have difficulty dressing or bathing?: No Independently performs ADLs?: Yes (appropriate for developmental age) Does the patient have difficulty walking or climbing stairs?: No Weakness of Legs: Both Weakness of Arms/Hands: None  Permission Sought/Granted                  Emotional Assessment              Admission diagnosis:  Hemorrhagic cystitis [N30.91] Gross hematuria [R31.0] Patient Active Problem List   Diagnosis Date Noted   Chronic diastolic CHF (congestive heart failure) ([place]) 02/21/2022   Hemorrhagic cystitis    Gross hematuria 02/20/2022   UTI (urinary tract infection) 01/06/2022   Generalized  weakness 01/06/2022   Symptomatic anemia 12/12/2021   Chronic kidney disease, stage 3b ([place]) 12/12/2021   Coronary artery disease involving native coronary artery of native heart 08/25/2021   STEMI involving right coronary artery ([place]) 05/13/2021   Normocytic anemia    Goals of care, counseling/discussion 08/08/2019   Prostate cancer ([place]) 08/08/2019   Essential hypertension 29/56/2130   METABOLIC SYNDROME X 86/57/8469   Local recurrence of prostate cancer ([place]) 06/22/2007   DIVERTICULITIS, HX OF 06/22/2007   PCP:  Owens Loffler, MD [id number] [id number]: [phone]: [phone]    Social Determinants of Health (SDOH) Interventions    Readmission Risk Interventions    02/22/2022    2:21 PM 01/08/2022    8:31 AM 04/14/2021   12:55 PM  Readmission Risk Prevention Plan  Transportation Screening Complete  Complete  PCP or Specialist Appt within 5-7 Days   Complete  PCP or Specialist Appt within 3-5 Days  Complete   Home Care Screening   Complete  Medication Review (RN CM)   Complete  HRI or Home Care Consult Patient refused Complete   Social  Work Consult for [place] Planning/Counseling Complete Complete   Palliative Care Screening Not Applicable Not Applicable   Medication Review Press photographer) Complete

## 2022-02-22 NOTE — Progress Notes (Signed)
Progress Note  Patient Name: Cesar Powell Date of Encounter: 02/22/2022  [facility] Cardiologist: None   Subjective   Feeling well.  No complaints.  Urine is clearing.   Inpatient Medications    Scheduled Meds:  sodium chloride   Intravenous Once   aspirin  81 mg Oral Daily   atorvastatin  80 mg Oral q1800   pantoprazole  40 mg Oral BID AC   tamsulosin  0.4 mg Oral QPC supper   Continuous Infusions:  sodium chloride 50 mL/hr at 02/22/22 0347   cefTRIAXone (ROCEPHIN)  IV Stopped (02/21/22 1700)   PRN Meds: acetaminophen, melatonin, mouth rinse, polyethylene glycol, prochlorperazine   Vital Signs    Vitals:   02/22/22 0404 02/22/22 0456 02/22/22 0500 02/22/22 0739  BP: (!) 146/56   (!) 144/59  Pulse: 60   63  Resp: 19   15  Temp: (!) 97.5 F (36.4 C) 98.6 F (37 C)  97.6 F (36.4 C)  TempSrc:  Oral  Oral  SpO2: 97%   99%  Weight:   90.8 kg   Height:        Intake/Output Summary (Last 24 hours) at 02/22/2022 1032 Last data filed at 02/22/2022 0457 Gross per 24 hour  Intake 817.43 ml  Output 1700 ml  Net -882.57 ml      02/22/2022    5:00 AM 01/30/2022    9:33 AM 01/14/2022    2:08 PM  Last 3 Weights  Weight (lbs) 200 lb 1.6 oz 195 lb 12.8 oz 200 lb 1 oz  Weight (kg) 90.765 kg 88.814 kg 90.748 kg      Telemetry    Sinus rhythm.  PVCs. - Personally Reviewed  ECG    N/a - Personally Reviewed  Physical Exam   VS:  BP (!) 144/59 (BP Location: Left Arm)   Pulse 63   Temp 97.6 F (36.4 C) (Oral)   Resp 15   Ht '5\' 7"'$  (1.702 m)   Wt 90.8 kg   SpO2 99%   BMI 31.34 kg/m  , BMI Body mass index is 31.34 kg/m. GENERAL:  Well appearing HEENT: Pupils equal round and reactive, fundi not visualized, oral mucosa unremarkable NECK:  No jugular venous distention, waveform within normal limits, carotid upstroke brisk and symmetric, no bruits, no thyromegaly LUNGS:  Clear to auscultation bilaterally HEART:  RRR.  PMI not displaced or sustained,S1 and  S2 within normal limits, no S3, no S4, no clicks, no rubs, no murmurs ABD:  Flat, positive bowel sounds normal in frequency in pitch, no bruits, no rebound, no guarding, no midline pulsatile mass, no hepatomegaly, no splenomegaly EXT:  2 plus pulses throughout, no edema, no cyanosis no clubbing SKIN:  No rashes no nodules NEURO:  Cranial nerves II through XII grossly intact, motor grossly intact throughout PSYCH:  Cognitively intact, oriented to person place and time   Labs    High Sensitivity Troponin:  No results for input(s): "TROPONINIHS" in the last 720 hours.   Chemistry Recent Labs  Lab 02/16/22 0924 02/20/22 1103 02/21/22 0429 02/22/22 0529  NA 137 139 139 139  K 4.1 4.5 4.3 4.2  CL 108 104 108 108  CO2 '26 23 24 24  '$ GLUCOSE 111* 159* 104* 102*  BUN 30* 33* 20 18  CREATININE 1.17 1.39* 1.09 1.08  CALCIUM 8.4* 8.9 8.4* 8.6*  MG  --   --  2.2  --   PROT 7.2  --  6.3*  --  ALBUMIN 3.2*  --  2.7*  --   AST 20  --  14*  --   ALT 14  --  13  --   ALKPHOS 62  --  50  --   BILITOT 0.7  --  0.8  --   GFRNONAA 60* 48* >60 >60  ANIONGAP 3* '12 7 7    '$ Lipids No results for input(s): "CHOL", "TRIG", "HDL", "LABVLDL", "LDLCALC", "CHOLHDL" in the last 168 hours.  Hematology Recent Labs  Lab 02/20/22 1103 02/21/22 0429 02/22/22 0529  WBC 9.6 6.4 5.9  RBC 2.84* 2.90* 2.91*  HGB 8.0* 8.1* 8.2*  HCT 26.2* 26.2* 25.6*  MCV 92.3 90.3 88.0  MCH 28.2 27.9 28.2  MCHC 30.5 30.9 32.0  RDW 16.7* 17.2* 16.8*  PLT 349 298 311   Thyroid No results for input(s): "TSH", "FREET4" in the last 168 hours.  BNPNo results for input(s): "BNP", "PROBNP" in the last 168 hours.  DDimer No results for input(s): "DDIMER" in the last 168 hours.   Radiology    CT Renal Stone Study  Result Date: 02/20/2022 CLINICAL DATA:  Flank pain, kidney stones suspected, hematuria, history of recurrent metastatic prostate cancer * Tracking Code: BO * EXAM: CT ABDOMEN AND PELVIS WITHOUT CONTRAST TECHNIQUE:  Multidetector CT imaging of the abdomen and pelvis was performed following the standard protocol without IV contrast. RADIATION DOSE REDUCTION: This exam was performed according to the departmental dose-optimization program which includes automated exposure control, adjustment of the mA and/or kV according to patient size and/or use of iterative reconstruction technique. COMPARISON:  PET-CT, 11/25/2021, CT chest abdomen pelvis, 04/02/2021 FINDINGS: Lower chest: No acute abnormality.  Coronary artery calcifications. Hepatobiliary: No solid liver abnormality is seen. No gallstones, gallbladder wall thickening, or biliary dilatation. Pancreas: Unremarkable. No pancreatic ductal dilatation or surrounding inflammatory changes. Spleen: Normal in size without significant abnormality. Adrenals/Urinary Tract: Adrenal glands are unremarkable. Simple, benign bilateral renal cortical cysts, for which no further follow-up or characterization is required kidneys are otherwise normal, without renal calculi, solid lesion, or hydronephrosis. Diffusely thickened urinary bladder with adjacent fat stranding and heterogeneously hyperdense material dependently (series 2, image 64). Stomach/Bowel: Stomach is within normal limits. Appendix appears normal. No evidence of bowel wall thickening, distention, or inflammatory changes. Sigmoid diverticula. Vascular/Lymphatic: Aortic atherosclerosis. Unchanged retroperitoneal lymphadenopathy, index left retroperitoneal node measuring 2.3 x 1.3 cm (series 2, image 32). Reproductive: Prostatomegaly. Probable TURP defect. Unchanged heterogeneous dystrophic calcification within the right aspect of the prostate (series 2, image 76). Other: Small umbilical hernia containing fat and a single, nonobstructed loop of mid small bowel (series 2, image 60). No ascites. Musculoskeletal: New sclerotic wedge deformity of L1 vertebral body (series 6, image 90) as well as sclerotic inferior endplate deformity of  the T12 vertebral body (series 6, image 98). Unchanged inferior endplate deformity of L3 and superior endplate deformity of L4 (series 6, image 92). IMPRESSION: 1. No urinary tract calculus or hydronephrosis. 2. Diffusely thickened urinary bladder with adjacent fat stranding and heterogeneously hyperdense material dependently, consistent with nonspecific infectious or inflammatory cystitis and most likely blood product or clot in the bladder lumen. Correlate with urinalysis. Radiation induced cystitis is a differential consideration. 3. Prostatomegaly with probable TURP defect. Unchanged heterogeneous dystrophic calcification within the right aspect of the prostate. 4. Unchanged retroperitoneal lymphadenopathy. 5. New sclerotic wedge deformity of L1 vertebral body as well as sclerotic inferior endplate deformity of the T12 vertebral body. Although nonspecific, this is worrisome for a pathologic fracture in the setting  of known metastatic prostate malignancy. 6. Small umbilical hernia containing fat and a single, nonobstructed loop of mid small bowel. 7. Coronary artery disease. Aortic Atherosclerosis (ICD10-I70.0). Electronically Signed   By: Delanna Ahmadi M.D.   On: 02/20/2022 12:28    Cardiac Studies   Echo 05/2021:  1. Left ventricular ejection fraction, by estimation, is 55 %. The left  ventricle has normal function. The left ventricle has no regional wall  motion abnormalities. Left ventricular diastolic parameters are consistent  with Grade I diastolic dysfunction  (impaired relaxation).   2. Right ventricular systolic function is normal. The right ventricular  size is normal. There is normal pulmonary artery systolic pressure. The  estimated right ventricular systolic pressure is 41.3 mmHg.   3. Left atrial size was moderately dilated.   4. Right atrial size was moderately dilated.   5. The mitral valve is normal in structure. No evidence of mitral valve  regurgitation. Mild mitral stenosis.    Patient Profile     Mr. Serpe is an 46M with CAD s/p inferior STEMI 05/2021, hypertension, hyperlipidemia, metastatic prostate cancer and chronic anemia admitted with hematuria.  Assessment & Plan    # CAD s/p STEMI:  # Hyperlipidemia:  # Anemia:  RCA PCI 05/2021.  Ticagrelor is on hold in the setting of UTI and hematuria.  Resume as able.  No beta blocker 2/2 bradycardia.  Continue aspirin and atorvastatin.  Maintain hgb >8.  # Hypertension:  BP poorly controlled.  Goal is <130/80.  He notes it has been averaging in the 130-150s at home.  Start losartan '25mg'$  daily.  Check BMP.     For questions or updates, please contact [place] Please consult [email or web address] for contact info under        Signed, Skeet Latch, MD  02/22/2022, 10:32 AM

## 2022-02-23 DIAGNOSIS — D638 Anemia in other chronic diseases classified elsewhere: Secondary | ICD-10-CM

## 2022-02-23 DIAGNOSIS — C61 Malignant neoplasm of prostate: Secondary | ICD-10-CM | POA: Diagnosis not present

## 2022-02-23 DIAGNOSIS — N3001 Acute cystitis with hematuria: Secondary | ICD-10-CM | POA: Diagnosis not present

## 2022-02-23 DIAGNOSIS — I10 Essential (primary) hypertension: Secondary | ICD-10-CM | POA: Diagnosis not present

## 2022-02-23 DIAGNOSIS — I2510 Atherosclerotic heart disease of native coronary artery without angina pectoris: Secondary | ICD-10-CM | POA: Diagnosis not present

## 2022-02-23 DIAGNOSIS — N189 Chronic kidney disease, unspecified: Secondary | ICD-10-CM

## 2022-02-23 DIAGNOSIS — R310 Gross hematuria: Secondary | ICD-10-CM | POA: Diagnosis not present

## 2022-02-23 LAB — BASIC METABOLIC PANEL
Anion gap: 8 (ref 5–15)
BUN: 24 mg/dL — ABNORMAL HIGH (ref 8–23)
CO2: 24 mmol/L (ref 22–32)
Calcium: 8.6 mg/dL — ABNORMAL LOW (ref 8.9–10.3)
Chloride: 108 mmol/L (ref 98–111)
Creatinine, Ser: 1.33 mg/dL — ABNORMAL HIGH (ref 0.61–1.24)
GFR, Estimated: 51 mL/min — ABNORMAL LOW (ref >60)
Glucose, Bld: 104 mg/dL — ABNORMAL HIGH (ref 70–99)
Potassium: 4.2 mmol/L (ref 3.5–5.1)
Sodium: 140 mmol/L (ref 135–145)

## 2022-02-23 LAB — CBC
HCT: 27.1 % — ABNORMAL LOW (ref 39.0–52.0)
Hemoglobin: 8.5 g/dL — ABNORMAL LOW (ref 13.0–17.0)
MCH: 28.1 pg (ref 26.0–34.0)
MCHC: 31.4 g/dL (ref 30.0–36.0)
MCV: 89.4 fL (ref 80.0–100.0)
Platelets: 317 10*3/uL (ref 150–400)
RBC: 3.03 MIL/uL — ABNORMAL LOW (ref 4.22–5.81)
RDW: 16.4 % — ABNORMAL HIGH (ref 11.5–15.5)
WBC: 5.6 10*3/uL (ref 4.0–10.5)
nRBC: 0 % (ref 0.0–0.2)

## 2022-02-23 LAB — URINE CULTURE: Culture: >=100000 — AB

## 2022-02-23 MED ORDER — SODIUM CHLORIDE 0.9 % IV SOLN
1.0000 g | Freq: Once | INTRAVENOUS | Status: AC
  Administered 2022-02-23: 1 g via INTRAVENOUS
  Filled 2022-02-23: qty 10

## 2022-02-23 MED ORDER — LOSARTAN POTASSIUM 25 MG PO TABS: 25.0000 mg | ORAL_TABLET | Freq: Every day | ORAL | 0 refills | Status: AC

## 2022-02-23 MED ORDER — AMOXICILLIN 500 MG PO CAPS: 500.0000 mg | ORAL_CAPSULE | Freq: Three times a day (TID) | ORAL | 0 refills | Status: AC

## 2022-02-23 MED ORDER — CLOPIDOGREL BISULFATE 75 MG PO TABS: 75.0000 mg | ORAL_TABLET | Freq: Every day | ORAL | 0 refills | Status: DC

## 2022-02-23 MED ORDER — LOSARTAN POTASSIUM 25 MG PO TABS
25.0000 mg | ORAL_TABLET | Freq: Every day | ORAL | Status: DC
  Administered 2022-02-23: 25 mg via ORAL
  Filled 2022-02-23: qty 1

## 2022-02-23 NOTE — TOC Initial Note (Signed)
Transition of Care [facility] - Initial/Assessment Note    Patient Details  Name: Cesar Powell MRN: [id number] Date of Birth: June 20, 1931  [facility]) CM/SW Contact:    Laurena Slimmer, RN Phone Number: 02/23/2022, 1:05 PM  Clinical Narrative:                 Spoke with patient by phone.   Admitted from:home  PCP:Dr. Edilia Bo  [facility] [address] Current home health/prior home health/DME:[facility], [facility] Bomb[facility] will transport patient home.      Barriers to Discharge: Barriers Resolved   Patient Goals and CMS Choice        Expected Discharge Plan and Services           Expected Discharge Date: 02/23/22                                    Prior Living Arrangements/Services     Patient language and need for interpreter reviewed:: Yes Do you feel safe going back to the place where you live?: Yes      Need for Family Participation in Patient Care: Yes (Comment) Care giver support system in place?: Yes (comment)   Criminal Activity/Legal Involvement Pertinent to Current Situation/Hospitalization: No - Comment as needed  Activities of Daily Living Home Assistive Devices/Equipment: None ADL Screening (condition at time of admission) Patient's cognitive ability adequate to safely complete daily activities?: Yes Is the patient deaf or have difficulty hearing?: No Does the patient have difficulty seeing, even when wearing glasses/contacts?: No Does the patient have difficulty concentrating, remembering, or making decisions?: No Patient able to express need for assistance with ADLs?: Yes Does the patient have difficulty dressing or bathing?: No Independently performs ADLs?: Yes (appropriate for developmental age) Does the patient have difficulty walking or climbing stairs?: No Weakness of Legs: Both Weakness of Arms/Hands: None  Permission Sought/Granted                  Emotional Assessment Appearance:: Other (Comment  Required Attitude/Demeanor/Rapport: Gracious, Engaged Affect (typically observed): Accepting Orientation: : Oriented to Self, Oriented to Place, Oriented to  Time, Oriented to Situation Alcohol / Substance Use: Not Applicable Psych Involvement: No (comment)  Admission diagnosis:  Hemorrhagic cystitis [N30.91] Gross hematuria [R31.0] Patient Active Problem List   Diagnosis Date Noted   Chronic diastolic CHF (congestive heart failure) ([place]) 02/21/2022   Hemorrhagic cystitis    Gross hematuria 02/20/2022   UTI (urinary tract infection) 01/06/2022   Generalized weakness 01/06/2022   Symptomatic anemia 12/12/2021   Chronic kidney disease, stage 3b ([place]) 12/12/2021   Coronary artery disease involving native coronary artery of native heart 08/25/2021   STEMI involving right coronary artery ([place]) 05/13/2021   Normocytic anemia    Goals of care, counseling/discussion 08/08/2019   Prostate cancer ([place]) 08/08/2019   Essential hypertension 23/53/6144   METABOLIC SYNDROME X 31/54/0086   Local recurrence of prostate cancer ([place]) 06/22/2007   DIVERTICULITIS, HX OF 06/22/2007   PCP:  Owens Loffler, MD [facility]   [id number] [id number]: [phone]: [phone]    Social Determinants of Health (SDOH) Interventions    Readmission Risk Interventions    02/23/2022    1:03 PM 02/22/2022    2:21 PM 01/08/2022    8:31 AM  Readmission Risk Prevention Plan  Transportation Screening Complete Complete  PCP or Specialist Appt within 3-5 Days   Complete  HRI or Home Care Consult  Patient refused Complete  Social Work Consult for [place] Planning/Counseling  Complete Complete  Palliative Care Screening  Not Applicable Not Applicable  Medication Review Press photographer) Complete Complete   PCP or Specialist appointment within 3-5 days of discharge Complete    HRI or [place] Not Complete     Palliative Care Screening Not [place] Not Applicable

## 2022-02-23 NOTE — Discharge Summary (Signed)
Physician Discharge Summary   Patient: Cesar Powell MRN: [id number] DOB: May 11, 1931  Admit date:     02/20/2022  Discharge date: 02/23/22  Discharge Physician: Loletha Grayer   PCP: Owens Loffler, MD   Recommendations at discharge:   Follow-up PCP 5 days Follow-up urology  Discharge Diagnoses: Principal Problem:   Gross hematuria Active Problems:   UTI (urinary tract infection)   Coronary artery disease involving native coronary artery of native heart   Essential hypertension   Prostate cancer (HCC)   Normocytic anemia   Chronic diastolic CHF (congestive heart failure) [facility])    Hospital Course: 88 year old male with history of prostate cancerstatus post radiation in 2015 on Relugolix, chronic normocytic anemia , coronary artery disease status post stenting on aspirin and Brilinta 05/13/2021, presents with gross hematuria and dysuria. CT renal stone showed no urinary tract calculus or hydronephrosis, diffusely thickened urinary bladder with adjacent fatty stranding noted consistent with nonspecific infectious or inflammatory cystitis and most likely blood product or clot in the bladder lumen.  In the ED and DAPT held cardio was consulted along with urology. Seen by urology advised continued treatment of UTI.  The patient was urinating so no need for Foley catheter treatment.  Aerococcus growing out of urine culture.  Patient given Rocephin prior to discharge and will give 6 more days of amoxicillin upon discharge.  Assessment and Plan: 1.  Gross hematuria.  Urology felt like combination of UTI and anticoagulant therapy.  Patient did not require Foley catheter.  Patient's Brilinta was held.  Patient on aspirin.  Cardiology recommended going back on Plavix which was prescribed into his pharmacy.  Urine was cloudy but not bloody upon discharge. 2.  Metastatic prostate cancer.  Follow-up with oncology as outpatient 3.  UTI.  Enterococcus growing out of urine culture.  Received  Rocephin here and will give 6 more days of amoxicillin upon discharge. 4.  BPH on Flomax 5.  History of CAD status post STEMI 05/13/2021.  Case discussed with cardiology and can prescribe Plavix upon discharge and continue aspirin.  Continue Lipitor.  Not on beta-blocker secondary to bradycardia 6.  Essential hypertension last blood pressure 130/58.  On losartan. 7.  Anemia of chronic disease hemoglobin stable at 8.5 upon discharge 8.  Acute kidney injury on chronic kidney disease stage IIIa.  Creatinine variable during the hospital course 1.33 upon discharge.  As good as 1.08 during the hospital course.  As high as 1.39 on 02/20/2022.  Recommend checking BMP and follow-up appointment 9.  Chronic diastolic congestive heart failure.  No signs of heart failure currently       Consultants: Neurology, cardiology Procedures performed: None Disposition: Home Diet recommendation:  Cardiac diet DISCHARGE MEDICATION: Allergies as of 02/23/2022   No Known Allergies      Medication List     STOP taking these medications    isosorbide mononitrate 30 MG 24 hr tablet Commonly known as: IMDUR   Relugolix 120 MG Tabs   ticagrelor 90 MG Tabs tablet Commonly known as: Brilinta   Vitamin D3 50 MCG (2000 UT) Tabs       TAKE these medications    amoxicillin 500 MG capsule Commonly known as: AMOXIL Take 1 capsule (500 mg total) by mouth 3 (three) times daily for 6 days.   aspirin 81 MG chewable tablet Chew 1 tablet (81 mg total) by mouth daily.   atorvastatin 80 MG tablet Commonly known as: LIPITOR Take 1 tablet (80 mg total) by mouth daily.  clopidogrel 75 MG tablet Commonly known as: Plavix Take 1 tablet (75 mg total) by mouth daily.   losartan 25 MG tablet Commonly known as: COZAAR Take 1 tablet (25 mg total) by mouth daily. What changed: how much to take   nitroGLYCERIN 0.4 MG SL tablet Commonly known as: NITROSTAT Place 0.4 mg under the tongue every 5 (five) minutes as  needed for chest pain.   pantoprazole 40 MG tablet Commonly known as: Protonix Take 1 tablet (40 mg total) by mouth 2 (two) times daily before a meal.   tamsulosin 0.4 MG Caps capsule Commonly known as: FLOMAX Take 0.4 mg by mouth daily after supper.        Follow-up Information     Furth, Cadence H, PA-C Follow up on 04/09/2022.   Specialty: Cardiology Why: Appointment on Thursday 04/09/2022 at [place] information: [place] [address] [phone]         Owens Loffler, MD Follow up in 5 day(s).   Specialties: Family Medicine, Sports Medicine Why: Appointment on Monday 03/02/2022 at 10:20am Contact information: [place] [address] [phone]                Discharge Exam: Danley Danker Weights   02/22/22 0500 02/23/22 0457  Weight: 90.8 kg 85.6 kg   Physical Exam HENT:     Head: Normocephalic.     Mouth/Throat:     Pharynx: No oropharyngeal exudate.  Eyes:     General: Lids are normal.     Conjunctiva/sclera: Conjunctivae normal.  Cardiovascular:     Rate and Rhythm: Normal rate and regular rhythm.     Heart sounds: Normal heart sounds, S1 normal and S2 normal.  Pulmonary:     Breath sounds: No decreased breath sounds, wheezing, rhonchi or rales.  Abdominal:     Palpations: Abdomen is soft.     Tenderness: There is no abdominal tenderness.  Musculoskeletal:     Right lower leg: Swelling present.     Left lower leg: Swelling present.  Skin:    General: Skin is warm.     Findings: No rash.  Neurological:     Mental Status: He is alert and oriented to person, place, and time.      Condition at discharge: stable  The results of significant diagnostics from this hospitalization (including imaging, microbiology, ancillary and laboratory) are listed below for reference.   Imaging Studies: CT Renal Stone Study  Result Date: 02/20/2022 CLINICAL DATA:  Flank pain, kidney stones suspected,  hematuria, history of recurrent metastatic prostate cancer * Tracking Code: BO * EXAM: CT ABDOMEN AND PELVIS WITHOUT CONTRAST TECHNIQUE: Multidetector CT imaging of the abdomen and pelvis was performed following the standard protocol without IV contrast. RADIATION DOSE REDUCTION: This exam was performed according to the departmental dose-optimization program which includes automated exposure control, adjustment of the mA and/or kV according to patient size and/or use of iterative reconstruction technique. COMPARISON:  PET-CT, 11/25/2021, CT chest abdomen pelvis, 04/02/2021 FINDINGS: Lower chest: No acute abnormality.  Coronary artery calcifications. Hepatobiliary: No solid liver abnormality is seen. No gallstones, gallbladder wall thickening, or biliary dilatation. Pancreas: Unremarkable. No pancreatic ductal dilatation or surrounding inflammatory changes. Spleen: Normal in size without significant abnormality. Adrenals/Urinary Tract: Adrenal glands are unremarkable. Simple, benign bilateral renal cortical cysts, for which no further follow-up or characterization is required kidneys are otherwise normal, without renal calculi, solid lesion, or hydronephrosis. Diffusely thickened urinary bladder with adjacent fat stranding  and heterogeneously hyperdense material dependently (series 2, image 64). Stomach/Bowel: Stomach is within normal limits. Appendix appears normal. No evidence of bowel wall thickening, distention, or inflammatory changes. Sigmoid diverticula. Vascular/Lymphatic: Aortic atherosclerosis. Unchanged retroperitoneal lymphadenopathy, index left retroperitoneal node measuring 2.3 x 1.3 cm (series 2, image 32). Reproductive: Prostatomegaly. Probable TURP defect. Unchanged heterogeneous dystrophic calcification within the right aspect of the prostate (series 2, image 76). Other: Small umbilical hernia containing fat and a single, nonobstructed loop of mid small bowel (series 2, image 60). No ascites.  Musculoskeletal: New sclerotic wedge deformity of L1 vertebral body (series 6, image 90) as well as sclerotic inferior endplate deformity of the T12 vertebral body (series 6, image 98). Unchanged inferior endplate deformity of L3 and superior endplate deformity of L4 (series 6, image 92). IMPRESSION: 1. No urinary tract calculus or hydronephrosis. 2. Diffusely thickened urinary bladder with adjacent fat stranding and heterogeneously hyperdense material dependently, consistent with nonspecific infectious or inflammatory cystitis and most likely blood product or clot in the bladder lumen. Correlate with urinalysis. Radiation induced cystitis is a differential consideration. 3. Prostatomegaly with probable TURP defect. Unchanged heterogeneous dystrophic calcification within the right aspect of the prostate. 4. Unchanged retroperitoneal lymphadenopathy. 5. New sclerotic wedge deformity of L1 vertebral body as well as sclerotic inferior endplate deformity of the T12 vertebral body. Although nonspecific, this is worrisome for a pathologic fracture in the setting of known metastatic prostate malignancy. 6. Small umbilical hernia containing fat and a single, nonobstructed loop of mid small bowel. 7. Coronary artery disease. Aortic Atherosclerosis (ICD10-I70.0). Electronically Signed   By: Delanna Ahmadi M.D.   On: 02/20/2022 12:28    Microbiology: Results for orders placed or performed during the hospital encounter of 02/20/22  Urine Culture     Status: Abnormal   Collection Time: 02/20/22  1:34 PM   Specimen: Urine, Clean Catch  Result Value Ref Range Status   Specimen Description   Final    URINE, CLEAN CATCH Performed at [facility], [address] [place], [place] [address]    Special Requests   Final    NONE Performed at [facility], [address] [place], [place] [address]    Culture >=100,000 COLONIES/mL AEROCOCCUS URINAE (A)  Final   Report Status 02/23/2022 FINAL  Final   Blood culture (routine x 2)     Status: None (Preliminary result)   Collection Time: 02/20/22  7:45 PM   Specimen: BLOOD  Result Value Ref Range Status   Specimen Description BLOOD RIGHT ANTECUBITAL  Final   Special Requests   Final    BOTTLES DRAWN AEROBIC AND ANAEROBIC Blood Culture results may not be optimal due to an excessive volume of blood received in culture bottles   Culture   Final    NO GROWTH 3 DAYS Performed at [facility], [address], [place], [place] [address]    Report Status PENDING  Incomplete  Blood culture (routine x 2)     Status: None (Preliminary result)   Collection Time: 02/20/22  8:04 PM   Specimen: BLOOD  Result Value Ref Range Status   Specimen Description BLOOD RIGHT ANTECUBITAL  Final   Special Requests   Final    BOTTLES DRAWN AEROBIC ONLY Blood Culture adequate volume   Culture   Final    NO GROWTH 3 DAYS Performed at [facility], [address] [place], [place] [address]    Report Status PENDING  Incomplete    Labs: CBC: Recent  Labs  Lab 02/20/22 1103 02/21/22 0429 02/22/22 0529 02/23/22 0627  WBC 9.6 6.4 5.9 5.6  NEUTROABS  --  4.9  --   --   HGB 8.0* 8.1* 8.2* 8.5*  HCT 26.2* 26.2* 25.6* 27.1*  MCV 92.3 90.3 88.0 89.4  PLT 349 298 311 355   Basic Metabolic Panel: Recent Labs  Lab 02/20/22 1103 02/21/22 0429 02/22/22 0529 02/23/22 0627  NA 139 139 139 140  K 4.5 4.3 4.2 4.2  CL 104 108 108 108  CO2 '23 24 24 24  '$ GLUCOSE 159* 104* 102* 104*  BUN 33* 20 18 24*  CREATININE 1.39* 1.09 1.08 1.33*  CALCIUM 8.9 8.4* 8.6* 8.6*  MG  --  2.2  --   --   PHOS  --  3.3  --   --    Liver Function Tests: Recent Labs  Lab 02/21/22 0429  AST 14*  ALT 13  ALKPHOS 50  BILITOT 0.8  PROT 6.3*  ALBUMIN 2.7*   CBG: No results for input(s): "GLUCAP" in the last 168 hours.  Discharge time spent: greater than 30 minutes.  Signed: Loletha Grayer, MD [facility] 02/23/2022

## 2022-02-23 NOTE — Plan of Care (Signed)

## 2022-02-23 NOTE — Care Management Important Message (Signed)
Important Message  Patient Details  Name: Cesar Powell MRN: [id number] Date of Birth: 20-May-1931   Medicare Important Message Given:  Yes     Dannette Barbara 02/23/2022, 11:42 AM

## 2022-02-23 NOTE — Progress Notes (Signed)
Cardiology Progress Note   Patient Name: Cesar Powell Date of Encounter: 02/23/2022  Primary Cardiologist: Ida Rogue, MD  Subjective   Patient feeling good today.  Voiding independently, urine yellow and clearing. Denies chest pain and shortness of breath.   Inpatient Medications    Scheduled Meds:  sodium chloride   Intravenous Once   aspirin  81 mg Oral Daily   atorvastatin  80 mg Oral q1800   pantoprazole  40 mg Oral BID AC   tamsulosin  0.4 mg Oral QPC supper   Continuous Infusions:  cefTRIAXone (ROCEPHIN)  IV 2 g (02/22/22 1631)   PRN Meds: acetaminophen, melatonin, mouth rinse, polyethylene glycol, prochlorperazine   Vital Signs    Vitals:   02/22/22 1952 02/22/22 2359 02/23/22 0457 02/23/22 0726  BP: (!) 158/75 (!) 144/57 131/65 (!) 144/65  Pulse: 67 66 67 64  Resp: '18 17 18 16  '$ Temp: 98 F (36.7 C) 98.2 F (36.8 C) 97.7 F (36.5 C) 98.4 F (36.9 C)  TempSrc:   Oral   SpO2: 100% 98% 98% 98%  Weight:   85.6 kg   Height:        Intake/Output Summary (Last 24 hours) at 02/23/2022 1047 Last data filed at 02/23/2022 1035 Gross per 24 hour  Intake 720 ml  Output 2350 ml  Net -1630 ml   Filed Weights   02/22/22 0500 02/23/22 0457  Weight: 90.8 kg 85.6 kg    Physical Exam   GEN: Well nourished, well developed, in no acute distress.  HEENT: Grossly normal.  Neck: Supple, no JVD, carotid bruits, or masses. Cardiac: RRR, 2/6 SEM @ the RUSB, no rubs or gallops. No clubbing, cyanosis, edema.  Radials 2+, DP/PT 2+ and equal bilaterally.  Respiratory:  Respirations regular and unlabored, clear to auscultation bilaterally. GI: Soft, nontender, nondistended, BS + x 4. MS: no deformity or atrophy. Skin: warm and dry, no rash. Neuro:  Strength and sensation are intact. Psych: AAOx3.  Normal affect.  Labs    Chemistry Recent Labs  Lab 02/21/22 0429 02/22/22 0529 02/23/22 0627  NA 139 139 140  K 4.3 4.2 4.2  CL 108 108 108  CO2 '24 24 24   '$ GLUCOSE 104* 102* 104*  BUN 20 18 24*  CREATININE 1.09 1.08 1.33*  CALCIUM 8.4* 8.6* 8.6*  PROT 6.3*  --   --   ALBUMIN 2.7*  --   --   AST 14*  --   --   ALT 13  --   --   ALKPHOS 50  --   --   BILITOT 0.8  --   --   GFRNONAA >60 >60 51*  ANIONGAP '7 7 8     '$ Hematology Recent Labs  Lab 02/21/22 0429 02/22/22 0529 02/23/22 0627  WBC 6.4 5.9 5.6  RBC 2.90* 2.91* 3.03*  HGB 8.1* 8.2* 8.5*  HCT 26.2* 25.6* 27.1*  MCV 90.3 88.0 89.4  MCH 27.9 28.2 28.1  MCHC 30.9 32.0 31.4  RDW 17.2* 16.8* 16.4*  PLT 298 311 317   BNP    Component Value Date/Time   BNP 286.5 (H) 08/21/2021 1524   Lipids  Lab Results  Component Value Date   CHOL 150 05/14/2021   HDL 49 05/14/2021   LDLCALC 89 05/14/2021   LDLDIRECT 148.9 12/07/2007   TRIG 58 05/14/2021   CHOLHDL 3.1 05/14/2021    HbA1c  Lab Results  Component Value Date   HGBA1C 5.7 (H) 05/14/2021    Radiology  CT Renal Stone Study  Result Date: 02/20/2022 CLINICAL DATA:  Flank pain, kidney stones suspected, hematuria, history of recurrent metastatic prostate cancer * Tracking Code: BO * EXAM: CT ABDOMEN AND PELVIS WITHOUT CONTRAST TECHNIQUE: Multidetector CT imaging of the abdomen and pelvis was performed following the standard protocol without IV contrast. RADIATION DOSE REDUCTION: This exam was performed according to the departmental dose-optimization program which includes automated exposure control, adjustment of the mA and/or kV according to patient size and/or use of iterative reconstruction technique. COMPARISON:  PET-CT, 11/25/2021, CT chest abdomen pelvis, 04/02/2021 FINDINGS: Lower chest: No acute abnormality.  Coronary artery calcifications. Hepatobiliary: No solid liver abnormality is seen. No gallstones, gallbladder wall thickening, or biliary dilatation. Pancreas: Unremarkable. No pancreatic ductal dilatation or surrounding inflammatory changes. Spleen: Normal in size without significant abnormality.  Adrenals/Urinary Tract: Adrenal glands are unremarkable. Simple, benign bilateral renal cortical cysts, for which no further follow-up or characterization is required kidneys are otherwise normal, without renal calculi, solid lesion, or hydronephrosis. Diffusely thickened urinary bladder with adjacent fat stranding and heterogeneously hyperdense material dependently (series 2, image 64). Stomach/Bowel: Stomach is within normal limits. Appendix appears normal. No evidence of bowel wall thickening, distention, or inflammatory changes. Sigmoid diverticula. Vascular/Lymphatic: Aortic atherosclerosis. Unchanged retroperitoneal lymphadenopathy, index left retroperitoneal node measuring 2.3 x 1.3 cm (series 2, image 32). Reproductive: Prostatomegaly. Probable TURP defect. Unchanged heterogeneous dystrophic calcification within the right aspect of the prostate (series 2, image 76). Other: Small umbilical hernia containing fat and a single, nonobstructed loop of mid small bowel (series 2, image 60). No ascites. Musculoskeletal: New sclerotic wedge deformity of L1 vertebral body (series 6, image 90) as well as sclerotic inferior endplate deformity of the T12 vertebral body (series 6, image 98). Unchanged inferior endplate deformity of L3 and superior endplate deformity of L4 (series 6, image 92). IMPRESSION: 1. No urinary tract calculus or hydronephrosis. 2. Diffusely thickened urinary bladder with adjacent fat stranding and heterogeneously hyperdense material dependently, consistent with nonspecific infectious or inflammatory cystitis and most likely blood product or clot in the bladder lumen. Correlate with urinalysis. Radiation induced cystitis is a differential consideration. 3. Prostatomegaly with probable TURP defect. Unchanged heterogeneous dystrophic calcification within the right aspect of the prostate. 4. Unchanged retroperitoneal lymphadenopathy. 5. New sclerotic wedge deformity of L1 vertebral body as well as  sclerotic inferior endplate deformity of the T12 vertebral body. Although nonspecific, this is worrisome for a pathologic fracture in the setting of known metastatic prostate malignancy. 6. Small umbilical hernia containing fat and a single, nonobstructed loop of mid small bowel. 7. Coronary artery disease. Aortic Atherosclerosis (ICD10-I70.0). Electronically Signed   By: Delanna Ahmadi M.D.   On: 02/20/2022 12:28    Telemetry   Non-tele  Cardiac Studies   Echo 05/15/21:  1. Left ventricular ejection fraction, by estimation, is 55 %. The left  ventricle has normal function. The left ventricle has no regional wall  motion abnormalities. Left ventricular diastolic parameters are consistent  with Grade I diastolic dysfunction  (impaired relaxation).   2. Right ventricular systolic function is normal. The right ventricular  size is normal. There is normal pulmonary artery systolic pressure. The  estimated right ventricular systolic pressure is 70.9 mmHg.   3. Left atrial size was moderately dilated.   4. Right atrial size was moderately dilated.   5. The mitral valve is normal in structure. No evidence of mitral valve  regurgitation. Mild mitral stenosis.    LHC 05/13/21: Severe multivessel coronary artery disease, as detailed  below.  The culprit lesion for the patient's inferior STEMI is a ruptured plaque with thrombus formation in the proximal RCA leading to 95% stenosis and distal embolization of clot. There is moderate-severe LAD and LCx disease that appears chronic. Mildly elevated left ventricular filling pressure (LVEDP 20 mmHg). Successful PCI to the proximal RCA using Onyx Frontier 3.0 x 15 mm drug-eluting stent (postdilated to 3.4 mm) with 0% residual stenosis and TIMI-3 flow. Right radial artery spasm precluding advancement of 68F guide catheter.  Consider alternative access if interventions are needed in the future. Coronary Diagrams   Diagnostic Dominance: Right Intervention          Patient Profile     88 y.o. male with a hx of CAD with inferior STEMI s/p DES RCA in 05/2021, HFpEF, HTN, metastatic prostate cancer, OSA, chronic anemia who was admitted 7/22 w/ hematuria in the setting of DAPT and cystitis.  Assessment & Plan     1.  Acute blood loss anemia/Hematuria/UTI: Patient has a history of prostate cancer currently on immunosuppression.  He presented to the ED with gross hematuria with hemoglobin of 8.  His baseline is around 9 with chronic anemia.  He was given 1 unit of RBCs and urology was consulted for suspected hematuria/UTI. He was started on antibiotics and foley was placed, this has since been removed. Brilinta has been on hold.  See below - we will look to resume DAPT but change to plavix for reduced bleeding risk.  Though ideally, we'd prefer that he remain on DAPT for 12 mos post-MI, if he has recurrent hematuria on plavix, low threshold to d/c.  Continue to monitor H&H. Antibiotics per primary team.    2.  CAD s/p PCI 05/2021: As above, Brilinta held for hematuria/anemia, aspirin was continued. H/H stable.  Urine clear.  No c/p or dyspnea.  Will plan to change brilinta to plavix '75mg'$  daily at discharge.  No beta blockers in the setting of bradycardia. Continue aspirin and atorvastatin.   3.  Essential Hypertension: BP poorly controlled.  Goal is <130/80.  He notes it has been averaging in the 130-150s at home.  Recommend starting Losartan 25 mg daily. Monitor kidney function.  4.  Hyperlipidemia: LDL 89 on 05/2021.  Continue atorvastatin 80 mg daily.    5.  AKI: Admission creatinine was 1.39 which improved with gentle diuresis.  Creat up today from 1.08  1.33.  Continue to monitor.  Adding ARB.   6.  Chronic HFpEF: Echo on 05/2021 showed LVEF 50% with Grade 1 Diastolic Dysfunction.  Patient is euvolemic on exam.  HR stable, BP up - adding ARB.  No longer on IVF but receiving IV abx, monitor with extra fluids. Can cosider adding SGLT2i as  outpt.   Signed, Murray Hodgkins, NP  02/23/2022, 10:47 AM    For questions or updates, please contact   Please consult [email or web address] for contact info under Cardiology/STEMI.

## 2022-02-24 ENCOUNTER — Other Ambulatory Visit: Payer: Self-pay | Admitting: *Deleted

## 2022-02-24 DIAGNOSIS — M9903 Segmental and somatic dysfunction of lumbar region: Secondary | ICD-10-CM | POA: Diagnosis not present

## 2022-02-24 DIAGNOSIS — M62830 Muscle spasm of back: Secondary | ICD-10-CM | POA: Diagnosis not present

## 2022-02-24 DIAGNOSIS — M9905 Segmental and somatic dysfunction of pelvic region: Secondary | ICD-10-CM | POA: Diagnosis not present

## 2022-02-24 DIAGNOSIS — M5136 Other intervertebral disc degeneration, lumbar region: Secondary | ICD-10-CM | POA: Diagnosis not present

## 2022-02-24 NOTE — Patient Outreach (Signed)
  Care Coordination [facility] Note Transition Care Management Unsuccessful Follow-up Telephone Call  Date of discharge and from where:  [facility] [phone]  Attempts:  1st Attempt  Reason for unsuccessful TCM follow-up call:  Left voice message [facility] [phone]

## 2022-02-25 ENCOUNTER — Other Ambulatory Visit: Payer: Self-pay | Admitting: *Deleted

## 2022-02-25 ENCOUNTER — Encounter: Payer: Self-pay | Admitting: Oncology

## 2022-02-25 LAB — CULTURE, BLOOD (ROUTINE X 2)
Culture: NO GROWTH
Culture: NO GROWTH
Special Requests: ADEQUATE

## 2022-02-25 NOTE — Patient Outreach (Signed)
  Care Coordination [facility] Note Transition Care Management Unsuccessful Follow-up Telephone Call  Date of discharge and from where:  [facility] [phone]  Attempts:  2nd Attempt  Reason for unsuccessful TCM follow-up call:  Left voice message [facility] [phone]

## 2022-02-26 ENCOUNTER — Ambulatory Visit (INDEPENDENT_AMBULATORY_CARE_PROVIDER_SITE_OTHER): Payer: Medicare Other | Admitting: Urology

## 2022-02-26 ENCOUNTER — Encounter: Payer: Self-pay | Admitting: Urology

## 2022-02-26 VITALS — BP 146/76 | HR 112 | Ht 67.5 in | Wt 188.0 lb

## 2022-02-26 DIAGNOSIS — Z87440 Personal history of urinary (tract) infections: Secondary | ICD-10-CM

## 2022-02-26 DIAGNOSIS — N390 Urinary tract infection, site not specified: Secondary | ICD-10-CM

## 2022-02-26 DIAGNOSIS — R339 Retention of urine, unspecified: Secondary | ICD-10-CM | POA: Diagnosis not present

## 2022-02-26 DIAGNOSIS — C61 Malignant neoplasm of prostate: Secondary | ICD-10-CM

## 2022-02-26 DIAGNOSIS — R399 Unspecified symptoms and signs involving the genitourinary system: Secondary | ICD-10-CM

## 2022-02-26 NOTE — Progress Notes (Signed)
02/26/2022 4:16 PM   Cesar Powell 1930/09/19 [id number]  Reason for visit: Follow up metastatic prostate cancer, history of urinary retention, incontinence, recent admission for gross hematuria  HPI: Complex 88 year old male with metastatic prostate cancer who has deferred ADT. Originally treated with radiation in 2005, and was on lupron for biochemical recurrence prior to discontinuing secondary to side effects. He has a history of cystolitholopaxy by Dr Pilar Jarvis in 2018. He also underwent brachytherapy and XRT to abdominal nodes in 2021 with Dr Baruch Gouty.  He also remains on dual anticoagulation after an MI in October 2022.  Was hospitalized mid May 2023 after a fall and also noted to have anemia requiring blood transfusion.  He was treated for a possible UTI at that time, but no urine culture was sent.  In the fall, he developed recurrent urinary retention that was managed by multiple on-call urology providers, and unclear if this was secondary to urethral stricture, bladder neck contracture, prostate obstruction, or atonic bladder.  Cystoscopy with me in November 2022 showed slightly shaggy appearance of the prostatic urethra consistent with prior radiation and possible local recurrence of prostate cancer, but no evidence of stricture and the cystoscope passed easily into the bladder without any resistance.  He was originally on intermittent catheterization and doing well, but over the last 6 months has been floridly incontinent leaking urine with low PVRs.  He is not even voiding spontaneously, just leaking into her depends.  He is primarily bothered by the leaking overnight.  We have tried numerous things including Cunningham clamp, condom catheter, and other collection devices without significant improvement.  PVR today is normal at 0 mL.   His prostate cancer is currently managed with oncology, and it looks like he was temporarily on Orgovyx, the oral ADT, however he discontinued that  medication recently as he felt like it was making him fall asleep while driving.  He was recently admitted on 02/20/2022 for gross hematuria, felt to be secondary to UTI.  Culture was ultimately positive and improved with antibiotics.  His PVRs were low, and no catheter needed to be placed.  I reviewed the CT dated 02/20/2022 that shows thickened bladder but nondistended, possible clot in the bladder, retroperitoneal lymphadenopathy consistent with metastatic disease.  I had another frank conversation with the patient that he has had a very complex history with varying urinary retention and incontinence, likely with combination of local recurrence of prostate cancer combined with long-term incomplete bladder emptying, brachytherapy, and radiation.  I am not sure that we have a good solution for his incontinence at this point.  He did well in the hospital using a pure wick, and we will see if we can get this approved through insurance for use at home.  Finally, we discussed that a palliative TURP would likely just worsen his incontinence, and I think it would be very unrealistic to think he would return to spontaneous voiding without leakage.  Another option would be a long-term Foley catheter or suprapubic tube, but he did not tolerate this well previously, and likely would be plagued by gross hematuria with his local recurrence of prostate cancer and anticoagulation.   -Continue follow-up with oncology for metastatic prostate cancer, currently off ADT -Continue to manage bladder with his baseline incontinence, would avoid catheters unless true retention, unfortunately I am not sure we have any good options for his incontinence in the setting of his complex history with likely component of atonic bladder, prostate cancer, and radiation -I again encouraged  him to meet with palliative care as his health continues to decline -We will try to arrange pure wick device for home -Recommended cranberry tablets to  help with UTI prevention -RTC 3 to 4 months PVR and symptom check  I spent 45 total minutes on the day of the encounter including pre-visit review of the medical record, face-to-face time with the patient, and post visit ordering of labs/imaging/tests.   Billey Co, [facility] [address], [place], [place] [address] [phone]

## 2022-02-27 ENCOUNTER — Other Ambulatory Visit: Payer: Self-pay | Admitting: *Deleted

## 2022-02-27 NOTE — Patient Outreach (Signed)
  Care Coordination [facility] Note Transition Care Management Unsuccessful Follow-up Telephone Call  Date of discharge and from where:  [facility] [phone]  Attempts:  3rd Attempt  Reason for unsuccessful TCM follow-up call:  Left voice message  [facility] [phone]

## 2022-03-02 ENCOUNTER — Inpatient Hospital Stay: Payer: Medicare Other | Admitting: Family Medicine

## 2022-03-02 ENCOUNTER — Ambulatory Visit (INDEPENDENT_AMBULATORY_CARE_PROVIDER_SITE_OTHER): Payer: Medicare Other | Admitting: Family Medicine

## 2022-03-02 ENCOUNTER — Encounter: Payer: Self-pay | Admitting: Family Medicine

## 2022-03-02 VITALS — BP 124/60 | HR 62 | Temp 97.5°F | Ht 67.5 in | Wt 192.2 lb

## 2022-03-02 DIAGNOSIS — I25119 Atherosclerotic heart disease of native coronary artery with unspecified angina pectoris: Secondary | ICD-10-CM | POA: Diagnosis not present

## 2022-03-02 DIAGNOSIS — R310 Gross hematuria: Secondary | ICD-10-CM

## 2022-03-02 DIAGNOSIS — N3001 Acute cystitis with hematuria: Secondary | ICD-10-CM

## 2022-03-02 DIAGNOSIS — D638 Anemia in other chronic diseases classified elsewhere: Secondary | ICD-10-CM

## 2022-03-02 DIAGNOSIS — N1831 Chronic kidney disease, stage 3a: Secondary | ICD-10-CM

## 2022-03-02 DIAGNOSIS — D500 Iron deficiency anemia secondary to blood loss (chronic): Secondary | ICD-10-CM

## 2022-03-02 LAB — BASIC METABOLIC PANEL
BUN: 23 mg/dL (ref 6–23)
CO2: 26 mEq/L (ref 19–32)
Calcium: 9.1 mg/dL (ref 8.4–10.5)
Chloride: 102 mEq/L (ref 96–112)
Creatinine, Ser: 1.02 mg/dL (ref 0.40–1.50)
GFR: 65.16 mL/min (ref >60.00)
Glucose, Bld: 97 mg/dL (ref 70–99)
Potassium: 4.9 mEq/L (ref 3.5–5.1)
Sodium: 137 mEq/L (ref 135–145)

## 2022-03-02 LAB — CBC WITH DIFFERENTIAL/PLATELET
Basophils Absolute: 0.1 10*3/uL (ref 0.0–0.1)
Basophils Relative: 1 % (ref 0.0–3.0)
Eosinophils Absolute: 0.4 10*3/uL (ref 0.0–0.7)
Eosinophils Relative: 6.1 % — ABNORMAL HIGH (ref 0.0–5.0)
HCT: 29.8 % — ABNORMAL LOW (ref 39.0–52.0)
Hemoglobin: 9.7 g/dL — ABNORMAL LOW (ref 13.0–17.0)
Lymphocytes Relative: 13.6 % (ref 12.0–46.0)
Lymphs Abs: 0.8 10*3/uL (ref 0.7–4.0)
MCHC: 32.7 g/dL (ref 30.0–36.0)
MCV: 88.8 fl (ref 78.0–100.0)
Monocytes Absolute: 0.5 10*3/uL (ref 0.1–1.0)
Monocytes Relative: 8.2 % (ref 3.0–12.0)
Neutro Abs: 4.1 10*3/uL (ref 1.4–7.7)
Neutrophils Relative %: 71.1 % (ref 43.0–77.0)
Platelets: 396 10*3/uL (ref 150.0–400.0)
RBC: 3.36 Mil/uL — ABNORMAL LOW (ref 4.22–5.81)
RDW: 17.1 % — ABNORMAL HIGH (ref 11.5–15.5)
WBC: 5.7 10*3/uL (ref 4.0–10.5)

## 2022-03-02 NOTE — Progress Notes (Signed)
Cesar Fortin T. Mishawn Hemann, MD, [place] at [facility] [place], [address]  Phone: [phone]  FAX: [phone]  Cesar Powell - 88 y.o. male  MRN [id number]  Date of Birth: Nov 20, 1930  Date: 03/02/2022  PCP: Owens Loffler, MD  Referral: Owens Loffler, MD  Chief Complaint  Patient presents with   Hospitalization Follow-up    Hemorrhagic Cystitis   Subjective:   Cesar Powell is a 88 y.o. very pleasant male patient with Body mass index is 29.67 kg/m. who presents with the following:  Admit date:     02/20/2022  Discharge date: 02/23/22   Admitted with gross hematuria and UTI.  He was placed on oral antibiotics.  Cardiology felt that he could go back on Plavix at discharge and continue ASA. -He did require a blood transfusion while he was in the hospital with a hemoglobin less than 8.  Hgb down to 8.5 at discharge  Acute renal insufficiency: His creatinine did increase to 1.4 up from a baseline of 1.0.  At the time of discharge, his creatinine was 1.3.  Leaking now basically all the time.  Urology wanted to d/c his Foley catheter and he is not self cathing at all right now.  This has been causing him a lot of problems, so this felt like he would do better just wearing some depends type pads.  Anti-cancer meds felt like he was really sleepy and had a hard time driving.   Stopped on his own.  Going to go on a no sugar diet to maybe help with cancer.  He has been listening to a lot of podcast, and he is encouraged about this. He is doing basically an 16 and 8 fasting program and minimally in taking some simple sugars.  Lost about 40 pounds.     Review of Systems is noted in the HPI, as appropriate  Objective:   BP 124/60   Pulse 62   Temp (!) 97.5 F (36.4 C) (Oral)   Ht 5' 7.5" (1.715 m)   Wt 192 lb 4 oz (87.2 kg)   SpO2 98%   BMI 29.67 kg/m   GEN: No acute distress;  alert,appropriate. PULM: Breathing comfortably in no respiratory distress PSYCH: Normally interactive.  CV: RRR, no m/g/r   Laboratory and Imaging Data:  Assessment and Plan:     ICD-10-CM   1. Acute cystitis with hematuria  N30.01     2. Gross hematuria  R31.0 CBC with Differential/Platelet    3. Blood loss anemia  D50.0 CBC with Differential/Platelet    4. Stage 3a chronic kidney disease (HCC)  A25.05 Basic metabolic panel    5. Anemia of chronic disease  D63.8      Hospitalization follow-up after acute UTI with gross hematuria.  He also has acute blood loss anemia on top of anemia of chronic disease.  Creatinine 1.4, we will recheck this today.  Globally, he has not done well and he has prostatic prostate cancer that has been resistant.  Recently stopped his oral anticancer medications, and he is going to see oncology in about 1 month.  He is starting a sugar fasting diet with the goal of this helping his cancer.  I am not sure if this will work, but it certainly cannot be harmful.  At the very least, it would be good from a cardiovascular standpoint, and he does have significant coronary disease.  Orders placed today for conditions managed today:  Orders Placed This Encounter  Procedures   Basic metabolic panel   CBC with Differential/Platelet    Follow-up if needed: 6 mo or sooner if needed  [employer] One speech-to-text software was used for transcription in this dictation.  Possible transcriptional errors can occur using Editor, commissioning.   Signed,  Maud Deed. Kalla Watson, MD   Outpatient Encounter Medications as of 03/02/2022  Medication Sig   aspirin 81 MG chewable tablet Chew 1 tablet (81 mg total) by mouth daily.   atorvastatin (LIPITOR) 80 MG tablet Take 1 tablet (80 mg total) by mouth daily.   clopidogrel (PLAVIX) 75 MG tablet Take 1 tablet (75 mg total) by mouth daily.   losartan (COZAAR) 25 MG tablet Take 1 tablet (25 mg total) by mouth daily.    nitroGLYCERIN (NITROSTAT) 0.4 MG SL tablet Place 0.4 mg under the tongue every 5 (five) minutes as needed for chest pain.   pantoprazole (PROTONIX) 40 MG tablet Take 1 tablet (40 mg total) by mouth 2 (two) times daily before a meal.   tamsulosin (FLOMAX) 0.4 MG CAPS capsule Take 0.4 mg by mouth daily after supper.   No facility-administered encounter medications on file as of 03/02/2022.

## 2022-03-02 NOTE — Telephone Encounter (Signed)
Oral Chemotherapy Pharmacist Encounter   I was able to speak with Cesar Powell today, he reported that he stopped the relugolix because it was making him sleepy and he was falling asleep while driving. I discussed with him the option of taking his relugolix in the evening so thatif it does make him tired it would align better with bedtime.   Cesar Powell does not want to retry the relugolix at this time. He stated that his children recommended that he try a sugar-free diet for his prostate cancer because "that is what is feeding his cancer". I told Cesar Powell that would would not recommend a sugar free diet to treat his prostate cancer and that his cancer is feeding off of testosterone. Restating the relugolix or switching back to ADT injections would be our treatment suggestion. He wants to stick to the sugar free diet only and see how his PSA responds at his next lab check.  Dr. Janese Banks informed of the patient's choice.     Darl Pikes, PharmD, BCPS, BCOP, CPP Hematology/Oncology Clinical Pharmacist [place]/[facility] [phone]  03/02/2022 9:05 AM

## 2022-03-12 ENCOUNTER — Other Ambulatory Visit: Payer: Self-pay | Admitting: Urology

## 2022-03-12 MED ORDER — AMOXICILLIN 500 MG PO CAPS: 500.0000 mg | ORAL_CAPSULE | Freq: Two times a day (BID) | ORAL | 0 refills | Status: DC

## 2022-03-12 NOTE — Progress Notes (Unsigned)
moxi

## 2022-03-13 ENCOUNTER — Other Ambulatory Visit: Payer: Self-pay

## 2022-03-13 ENCOUNTER — Encounter: Payer: Self-pay | Admitting: Emergency Medicine

## 2022-03-13 DIAGNOSIS — I2510 Atherosclerotic heart disease of native coronary artery without angina pectoris: Secondary | ICD-10-CM | POA: Diagnosis present

## 2022-03-13 DIAGNOSIS — N179 Acute kidney failure, unspecified: Secondary | ICD-10-CM | POA: Diagnosis not present

## 2022-03-13 DIAGNOSIS — D649 Anemia, unspecified: Secondary | ICD-10-CM | POA: Diagnosis not present

## 2022-03-13 DIAGNOSIS — R339 Retention of urine, unspecified: Secondary | ICD-10-CM | POA: Diagnosis not present

## 2022-03-13 DIAGNOSIS — I130 Hypertensive heart and chronic kidney disease with heart failure and stage 1 through stage 4 chronic kidney disease, or unspecified chronic kidney disease: Secondary | ICD-10-CM | POA: Diagnosis present

## 2022-03-13 DIAGNOSIS — C61 Malignant neoplasm of prostate: Secondary | ICD-10-CM | POA: Diagnosis not present

## 2022-03-13 DIAGNOSIS — N3001 Acute cystitis with hematuria: Secondary | ICD-10-CM | POA: Diagnosis not present

## 2022-03-13 DIAGNOSIS — Z79899 Other long term (current) drug therapy: Secondary | ICD-10-CM | POA: Diagnosis not present

## 2022-03-13 DIAGNOSIS — R310 Gross hematuria: Secondary | ICD-10-CM | POA: Diagnosis not present

## 2022-03-13 DIAGNOSIS — Z923 Personal history of irradiation: Secondary | ICD-10-CM | POA: Diagnosis not present

## 2022-03-13 DIAGNOSIS — E785 Hyperlipidemia, unspecified: Secondary | ICD-10-CM | POA: Diagnosis present

## 2022-03-13 DIAGNOSIS — G4730 Sleep apnea, unspecified: Secondary | ICD-10-CM | POA: Diagnosis not present

## 2022-03-13 DIAGNOSIS — I5032 Chronic diastolic (congestive) heart failure: Secondary | ICD-10-CM | POA: Diagnosis present

## 2022-03-13 DIAGNOSIS — I252 Old myocardial infarction: Secondary | ICD-10-CM

## 2022-03-13 DIAGNOSIS — Z7902 Long term (current) use of antithrombotics/antiplatelets: Secondary | ICD-10-CM

## 2022-03-13 DIAGNOSIS — N3091 Cystitis, unspecified with hematuria: Secondary | ICD-10-CM | POA: Diagnosis present

## 2022-03-13 DIAGNOSIS — Z801 Family history of malignant neoplasm of trachea, bronchus and lung: Secondary | ICD-10-CM | POA: Diagnosis not present

## 2022-03-13 DIAGNOSIS — I10 Essential (primary) hypertension: Secondary | ICD-10-CM | POA: Diagnosis not present

## 2022-03-13 DIAGNOSIS — Z7982 Long term (current) use of aspirin: Secondary | ICD-10-CM

## 2022-03-13 DIAGNOSIS — N1832 Chronic kidney disease, stage 3b: Secondary | ICD-10-CM | POA: Diagnosis present

## 2022-03-13 DIAGNOSIS — D62 Acute posthemorrhagic anemia: Secondary | ICD-10-CM | POA: Diagnosis present

## 2022-03-13 DIAGNOSIS — R319 Hematuria, unspecified: Secondary | ICD-10-CM | POA: Diagnosis not present

## 2022-03-13 DIAGNOSIS — Z8249 Family history of ischemic heart disease and other diseases of the circulatory system: Secondary | ICD-10-CM | POA: Diagnosis not present

## 2022-03-13 DIAGNOSIS — Z87891 Personal history of nicotine dependence: Secondary | ICD-10-CM

## 2022-03-13 DIAGNOSIS — Z8679 Personal history of other diseases of the circulatory system: Secondary | ICD-10-CM | POA: Diagnosis not present

## 2022-03-13 DIAGNOSIS — Z8546 Personal history of malignant neoplasm of prostate: Secondary | ICD-10-CM | POA: Diagnosis not present

## 2022-03-13 LAB — COMPREHENSIVE METABOLIC PANEL
ALT: 14 U/L (ref 0–44)
AST: 22 U/L (ref 15–41)
Albumin: 3.9 g/dL (ref 3.5–5.0)
Alkaline Phosphatase: 61 U/L (ref 38–126)
Anion gap: 10 (ref 5–15)
BUN: 27 mg/dL — ABNORMAL HIGH (ref 8–23)
CO2: 22 mmol/L (ref 22–32)
Calcium: 9.1 mg/dL (ref 8.9–10.3)
Chloride: 101 mmol/L (ref 98–111)
Creatinine, Ser: 1.51 mg/dL — ABNORMAL HIGH (ref 0.61–1.24)
GFR, Estimated: 44 mL/min — ABNORMAL LOW (ref >60)
Glucose, Bld: 133 mg/dL — ABNORMAL HIGH (ref 70–99)
Potassium: 4.5 mmol/L (ref 3.5–5.1)
Sodium: 133 mmol/L — ABNORMAL LOW (ref 135–145)
Total Bilirubin: 0.5 mg/dL (ref 0.3–1.2)
Total Protein: 8 g/dL (ref 6.5–8.1)

## 2022-03-13 LAB — CBC WITH DIFFERENTIAL/PLATELET
Abs Immature Granulocytes: 0.04 10*3/uL (ref 0.00–0.07)
Basophils Absolute: 0.1 10*3/uL (ref 0.0–0.1)
Basophils Relative: 1 %
Eosinophils Absolute: 0.2 10*3/uL (ref 0.0–0.5)
Eosinophils Relative: 3 %
HCT: 31.5 % — ABNORMAL LOW (ref 39.0–52.0)
Hemoglobin: 9.8 g/dL — ABNORMAL LOW (ref 13.0–17.0)
Immature Granulocytes: 1 %
Lymphocytes Relative: 7 %
Lymphs Abs: 0.6 10*3/uL — ABNORMAL LOW (ref 0.7–4.0)
MCH: 28.8 pg (ref 26.0–34.0)
MCHC: 31.1 g/dL (ref 30.0–36.0)
MCV: 92.6 fL (ref 80.0–100.0)
Monocytes Absolute: 0.7 10*3/uL (ref 0.1–1.0)
Monocytes Relative: 9 %
Neutro Abs: 7 10*3/uL (ref 1.7–7.7)
Neutrophils Relative %: 79 %
Platelets: 316 10*3/uL (ref 150–400)
RBC: 3.4 MIL/uL — ABNORMAL LOW (ref 4.22–5.81)
RDW: 17.1 % — ABNORMAL HIGH (ref 11.5–15.5)
WBC: 8.6 10*3/uL (ref 4.0–10.5)
nRBC: 0 % (ref 0.0–0.2)

## 2022-03-13 NOTE — ED Triage Notes (Signed)
Pt to ED via POV, pt c/o urinating blood. Pt recently admitted for same. Pt states attempted to self cath earlier today and "got nothing but blood". Pt admitted for hemorrhagic cyustitis several weeks ago. Pt A&O x4, ambulatory to triage . Pt with noted pale mucous membranes noted in triage.

## 2022-03-14 ENCOUNTER — Observation Stay: Payer: Medicare Other

## 2022-03-14 ENCOUNTER — Inpatient Hospital Stay
Admission: EM | Admit: 2022-03-14 | Discharge: 2022-03-16 | DRG: 690 | Disposition: A | Discharge status: Home / Self Care | Payer: Medicare Other | Attending: Internal Medicine | Admitting: Internal Medicine

## 2022-03-14 DIAGNOSIS — I252 Old myocardial infarction: Secondary | ICD-10-CM | POA: Diagnosis not present

## 2022-03-14 DIAGNOSIS — N3091 Cystitis, unspecified with hematuria: Principal | ICD-10-CM

## 2022-03-14 DIAGNOSIS — Z87891 Personal history of nicotine dependence: Secondary | ICD-10-CM | POA: Diagnosis not present

## 2022-03-14 DIAGNOSIS — G4730 Sleep apnea, unspecified: Secondary | ICD-10-CM | POA: Diagnosis present

## 2022-03-14 DIAGNOSIS — Z8546 Personal history of malignant neoplasm of prostate: Secondary | ICD-10-CM

## 2022-03-14 DIAGNOSIS — Z79899 Other long term (current) drug therapy: Secondary | ICD-10-CM | POA: Diagnosis not present

## 2022-03-14 DIAGNOSIS — Z923 Personal history of irradiation: Secondary | ICD-10-CM | POA: Diagnosis not present

## 2022-03-14 DIAGNOSIS — R319 Hematuria, unspecified: Secondary | ICD-10-CM | POA: Diagnosis not present

## 2022-03-14 DIAGNOSIS — C61 Malignant neoplasm of prostate: Secondary | ICD-10-CM

## 2022-03-14 DIAGNOSIS — I5032 Chronic diastolic (congestive) heart failure: Secondary | ICD-10-CM

## 2022-03-14 DIAGNOSIS — N1832 Chronic kidney disease, stage 3b: Secondary | ICD-10-CM

## 2022-03-14 DIAGNOSIS — Z8679 Personal history of other diseases of the circulatory system: Secondary | ICD-10-CM

## 2022-03-14 DIAGNOSIS — R339 Retention of urine, unspecified: Secondary | ICD-10-CM

## 2022-03-14 DIAGNOSIS — N3001 Acute cystitis with hematuria: Secondary | ICD-10-CM

## 2022-03-14 DIAGNOSIS — D649 Anemia, unspecified: Secondary | ICD-10-CM | POA: Diagnosis not present

## 2022-03-14 DIAGNOSIS — Z7902 Long term (current) use of antithrombotics/antiplatelets: Secondary | ICD-10-CM | POA: Diagnosis not present

## 2022-03-14 DIAGNOSIS — Z8249 Family history of ischemic heart disease and other diseases of the circulatory system: Secondary | ICD-10-CM | POA: Diagnosis not present

## 2022-03-14 DIAGNOSIS — I10 Essential (primary) hypertension: Secondary | ICD-10-CM

## 2022-03-14 DIAGNOSIS — E785 Hyperlipidemia, unspecified: Secondary | ICD-10-CM | POA: Diagnosis present

## 2022-03-14 DIAGNOSIS — R310 Gross hematuria: Secondary | ICD-10-CM

## 2022-03-14 DIAGNOSIS — Z7982 Long term (current) use of aspirin: Secondary | ICD-10-CM | POA: Diagnosis not present

## 2022-03-14 DIAGNOSIS — I2510 Atherosclerotic heart disease of native coronary artery without angina pectoris: Secondary | ICD-10-CM | POA: Diagnosis present

## 2022-03-14 DIAGNOSIS — R338 Other retention of urine: Secondary | ICD-10-CM

## 2022-03-14 DIAGNOSIS — I130 Hypertensive heart and chronic kidney disease with heart failure and stage 1 through stage 4 chronic kidney disease, or unspecified chronic kidney disease: Secondary | ICD-10-CM | POA: Diagnosis present

## 2022-03-14 DIAGNOSIS — Z801 Family history of malignant neoplasm of trachea, bronchus and lung: Secondary | ICD-10-CM | POA: Diagnosis not present

## 2022-03-14 DIAGNOSIS — N390 Urinary tract infection, site not specified: Secondary | ICD-10-CM | POA: Diagnosis present

## 2022-03-14 DIAGNOSIS — D62 Acute posthemorrhagic anemia: Secondary | ICD-10-CM | POA: Diagnosis present

## 2022-03-14 DIAGNOSIS — N179 Acute kidney failure, unspecified: Secondary | ICD-10-CM

## 2022-03-14 LAB — CBC
HCT: 24.7 % — ABNORMAL LOW (ref 39.0–52.0)
HCT: 28.4 % — ABNORMAL LOW (ref 39.0–52.0)
HCT: 23.6 % — ABNORMAL LOW (ref 39.0–52.0)
Hemoglobin: 7.8 g/dL — ABNORMAL LOW (ref 13.0–17.0)
Hemoglobin: 8.6 g/dL — ABNORMAL LOW (ref 13.0–17.0)
Hemoglobin: 7.4 g/dL — ABNORMAL LOW (ref 13.0–17.0)
MCH: 28.6 pg (ref 26.0–34.0)
MCH: 29.1 pg (ref 26.0–34.0)
MCH: 29 pg (ref 26.0–34.0)
MCHC: 31.6 g/dL (ref 30.0–36.0)
MCHC: 30.3 g/dL (ref 30.0–36.0)
MCHC: 31.4 g/dL (ref 30.0–36.0)
MCV: 90.5 fL (ref 80.0–100.0)
MCV: 95.9 fL (ref 80.0–100.0)
MCV: 92.5 fL (ref 80.0–100.0)
Platelets: 237 10*3/uL (ref 150–400)
Platelets: 250 10*3/uL (ref 150–400)
Platelets: 247 10*3/uL (ref 150–400)
RBC: 2.73 MIL/uL — ABNORMAL LOW (ref 4.22–5.81)
RBC: 2.96 MIL/uL — ABNORMAL LOW (ref 4.22–5.81)
RBC: 2.55 MIL/uL — ABNORMAL LOW (ref 4.22–5.81)
RDW: 17.1 % — ABNORMAL HIGH (ref 11.5–15.5)
RDW: 17.1 % — ABNORMAL HIGH (ref 11.5–15.5)
RDW: 17.2 % — ABNORMAL HIGH (ref 11.5–15.5)
WBC: 6.6 10*3/uL (ref 4.0–10.5)
WBC: 8.5 10*3/uL (ref 4.0–10.5)
WBC: 6 10*3/uL (ref 4.0–10.5)
nRBC: 0 % (ref 0.0–0.2)
nRBC: 0 % (ref 0.0–0.2)
nRBC: 0 % (ref 0.0–0.2)

## 2022-03-14 LAB — URINALYSIS, ROUTINE W REFLEX MICROSCOPIC
RBC / HPF: >50 RBC/hpf — ABNORMAL HIGH (ref 0–5)
Specific Gravity, Urine: 1.034 — ABNORMAL HIGH (ref 1.005–1.030)
Squamous Epithelial / HPF: NONE SEEN (ref 0–5)
WBC, UA: >50 WBC/hpf — ABNORMAL HIGH (ref 0–5)

## 2022-03-14 LAB — APTT: aPTT: 38 seconds — ABNORMAL HIGH (ref 24–36)

## 2022-03-14 LAB — PROTIME-INR
INR: 1.2 (ref 0.8–1.2)
Prothrombin Time: 15.4 seconds — ABNORMAL HIGH (ref 11.4–15.2)

## 2022-03-14 LAB — BRAIN NATRIURETIC PEPTIDE: B Natriuretic Peptide: 141.6 pg/mL — ABNORMAL HIGH (ref 0.0–100.0)

## 2022-03-14 MED ORDER — HYDRALAZINE HCL 20 MG/ML IJ SOLN
5.0000 mg | INTRAMUSCULAR | Status: DC | PRN
  Administered 2022-03-14: 5 mg via INTRAVENOUS
  Filled 2022-03-14: qty 1

## 2022-03-14 MED ORDER — OXYCODONE-ACETAMINOPHEN 5-325 MG PO TABS
1.0000 | ORAL_TABLET | Freq: Once | ORAL | Status: AC
  Administered 2022-03-14: 1 via ORAL
  Filled 2022-03-14: qty 1

## 2022-03-14 MED ORDER — ASPIRIN 81 MG PO CHEW
81.0000 mg | CHEWABLE_TABLET | Freq: Every day | ORAL | Status: DC
  Administered 2022-03-14 – 2022-03-16 (×2): 81 mg via ORAL
  Filled 2022-03-14 (×2): qty 1

## 2022-03-14 MED ORDER — ONDANSETRON HCL 4 MG/2ML IJ SOLN: 4.0000 mg | Freq: Three times a day (TID) | INTRAMUSCULAR | Status: DC | PRN

## 2022-03-14 MED ORDER — HYDRALAZINE HCL 20 MG/ML IJ SOLN: 5.0000 mg | INTRAMUSCULAR | Status: DC | PRN

## 2022-03-14 MED ORDER — SODIUM CHLORIDE 0.9 % IV SOLN
1.0000 g | INTRAVENOUS | Status: DC
  Administered 2022-03-14 – 2022-03-16 (×3): 1 g via INTRAVENOUS
  Filled 2022-03-14 (×2): qty 10

## 2022-03-14 MED ORDER — TAMSULOSIN HCL 0.4 MG PO CAPS
0.4000 mg | ORAL_CAPSULE | Freq: Every day | ORAL | Status: DC
  Administered 2022-03-14 – 2022-03-16 (×3): 0.4 mg via ORAL
  Filled 2022-03-14 (×3): qty 1

## 2022-03-14 MED ORDER — OXYBUTYNIN CHLORIDE 5 MG PO TABS
2.5000 mg | ORAL_TABLET | ORAL | Status: AC
Start: 2022-03-14 — End: 2022-03-14
  Administered 2022-03-14: 2.5 mg via ORAL
  Filled 2022-03-14: qty 0.5

## 2022-03-14 MED ORDER — NITROGLYCERIN 0.4 MG SL SUBL: 0.4000 mg | SUBLINGUAL_TABLET | SUBLINGUAL | Status: DC | PRN

## 2022-03-14 MED ORDER — MORPHINE SULFATE (PF) 2 MG/ML IV SOLN
2.0000 mg | Freq: Once | INTRAVENOUS | Status: AC
  Administered 2022-03-14: 2 mg via INTRAVENOUS

## 2022-03-14 MED ORDER — MORPHINE SULFATE (PF) 2 MG/ML IV SOLN
1.0000 mg | INTRAVENOUS | Status: DC | PRN
  Administered 2022-03-14: 1 mg via INTRAVENOUS
  Filled 2022-03-14 (×2): qty 1

## 2022-03-14 MED ORDER — ATORVASTATIN CALCIUM 20 MG PO TABS
80.0000 mg | ORAL_TABLET | Freq: Every day | ORAL | Status: DC
  Administered 2022-03-14 – 2022-03-16 (×3): 80 mg via ORAL
  Filled 2022-03-14 (×3): qty 4

## 2022-03-14 MED ORDER — ACETAMINOPHEN 325 MG PO TABS
650.0000 mg | ORAL_TABLET | Freq: Four times a day (QID) | ORAL | Status: DC | PRN
  Administered 2022-03-14: 650 mg via ORAL
  Filled 2022-03-14: qty 2

## 2022-03-14 MED ORDER — SODIUM CHLORIDE 0.9 % IR SOLN
3000.0000 mL | Status: DC
  Administered 2022-03-14 – 2022-03-15 (×6): 3000 mL

## 2022-03-14 NOTE — Assessment & Plan Note (Signed)
-  see above 

## 2022-03-14 NOTE — Assessment & Plan Note (Addendum)
Hemoglobin 9.8 today (9.7 on 02/02/2022) --> 7.8 --> -Follow-up CBC every 6 hours -will transfuse blood if Hgb is < 7.0

## 2022-03-14 NOTE — Assessment & Plan Note (Addendum)
No chest pain -Hold Plavix due to hematuria -will continue ASA 81 mg daily -Continue Lipitor

## 2022-03-14 NOTE — ED Notes (Signed)
Pt was rescanned by charge nurse and the scanner is stating  "54m". BErlene QuanMD is aware and will be bedside "when I can". Urology cart and bladder scanner are at patient's bedside. Waiting for pain management orders.

## 2022-03-14 NOTE — ED Notes (Signed)
Po fluids provided per pt request. Call bell at left side, bloody urine without clots draining into foley drainage bag, irrigation fluid with one drop approx every 10 seconds, rate was previously set by urologist per previous RN.

## 2022-03-14 NOTE — ED Notes (Signed)
Pt states he still is having bladder spasms and pain in his penis, states that medication that was given approx 30 mins ago has not helped. States it burns all the time. Pt is A&Ox4 and NAD.

## 2022-03-14 NOTE — ED Notes (Addendum)
This RN and Faith NT at bedside at this time. Pt has hematuria leaking around his catheter, attempted to irrigate catheter with a push/pull method and syringe but urine continues to leak around the cath. Pt cleaned and dried at this time. Full linen change including sheet, pad, and gown. Pt also repositioned in the bed.   This RN attempted to deflate the balloon and noted there was only 10cc in the 30cc balloon. Foley cath was inserted by previous shift RN.   Verbal orders to discontinue cath and bladder scan. Bladder scan showed 366m.

## 2022-03-14 NOTE — Assessment & Plan Note (Addendum)
Gross hematuria, urinary retention and UTI: Patient seems to have hemorrhagic cystitis with h/o radiation and chronic bladder wall thickening as noted on Imaging studies -- Urinalysis positive for UTI.   --Appreciated Consult with Dr. Erlene Quan of urology-- 200 mL of blood clot evacuated with manual flushing of Foley by Dr. Erlene Quan. Continue CBI for now -Three way Foley was placed and started CBI in ED -IV Rocephin for UTI -Follow-up urine culture -Flomax

## 2022-03-14 NOTE — Consult Note (Signed)
Urology Consult  I have been asked to see the patient by Dr. Blaine Hamper, for evaluation and management of hematuria/ clot retention.  Chief Complaint: Inability to void, blood in urine  History of Present Illness: Cesar Powell is a 88 y.o. year old male who presented to the emergency room overnight with inability to void.  He reports that on Thursday, he began having difficulty emptying his bladder.  He has a personal history of self cath and was able to self cath and empty really well on that day.  Yesterday, he had the same issue along with urinary burning, tried to self cath was unsuccessful me meeting some resistance with a large amount of blood.  Ultimately overnight, he became uncomfortable and presented to the emergency room overnight.  Urology contacted around 8 AM this morning at the time of decision for admission for consultation for hematuria.  At that time, I&O catheter had been attempted in the early morning hours and failed.  Dr. Karma Greaser then ordered three-way Foley catheter to be placed of unknown size and CBI was initiated prior to consultatoin.  The patient reports that he was never irrigated prior to initiation of CBI.  I was contacted by secure chat around 10 AM by the nurse who indicated that the first bag ran without difficulty but the second bag it stopped draining.  Bladder scan at the time revealed 326 cc in the bladder and he was leaking around the catheter.  The nurse had deflated the balloon and only 10 cc was in the balloon, filled with 30 cc and the patient was in excruciating pain.  I gave orders to remove the Foley catheter with trial of void at this point in time.  I was then contacted at 1135am indicating the patient was more comfortable and after several attempts, ultimately bladder scan revealed 549 mL of the bladder.  Orders were given for pain control and a limited pelvic ultrasound was ordered to assess clot burden.  Immediately after this was completed, I placed a  24 [place] coud tip reinforced hematuria catheter without difficulty at 1245pm with 30 cc in the balloon..  I then remained at the bedside for 60 minutes irrigating by hand with 3 L of sterile water until no clot burden was able to be irrigated and the urine was light pink, ultimately clearing about 200 cc of clot.  I then initiate a very slow drip CBI which time the urine remained clear.  The patient's pain resolved and he is quite comfortable.  Notably, the patient has a long history of prostate cancer after primary radiation, recurrence followed by brachy seed implantation and radiation to abdominal lymph nodes and interval development of metastatic disease.  He is managed by Dr. Diamantina Providence and Dr. Janese Banks.  Most recently, he is elected to stop ADT in the form of Orgovyx about a month and a half ago secondary to extreme fatigue.  He is not being treated currently.  He remains on aspirin and Plavix.  Notably, he was admitted last month with hematuria however on this occasion, he was ultimately able to void.  Urine culture was positive for Enterococcus on this occasion.  Past Medical History:  Diagnosis Date   Coronary artery disease involving native coronary artery of native heart 08/25/2021   H/O prostate cancer    was treated with radiation   Hypertension    Local recurrence of prostate cancer ([place]) 06/21/2004   Qualifier: Diagnosis of  By: Fuller Plan CMA (  AAMA), [place]     Myocardial infarction ([place]) 2022   Obstructive sleep apnea 06/22/2007   NPSG [place] 1979 Unattended Home sleep Study- 05/10/14- Confirms severe OSA, AHI 41.8/ hr, weight 230 pounds CPAP and also Transcend portable CPAP auto 8-15     Sleep apnea 1977   uses C-pap     Past Surgical History:  Procedure Laterality Date   ABDOMINAL SURGERY     ruptured intestines    CATARACT EXTRACTION W/PHACO Left 01/29/2016   Procedure: CATARACT EXTRACTION PHACO AND INTRAOCULAR LENS PLACEMENT ([place]) left eye;  Surgeon: Leandrew Koyanagi, MD;   Location: [place];  Service: Ophthalmology;  Laterality: Left;  RESTOR LENS   CATARACT EXTRACTION W/PHACO Right 10/21/2016   Procedure: CATARACT EXTRACTION PHACO AND INTRAOCULAR LENS PLACEMENT ([facility])  Right restor toric lens;  Surgeon: Leandrew Koyanagi, MD;  Location: [place];  Service: Ophthalmology;  Laterality: Right;  restor toric lens   CORONARY/GRAFT ACUTE MI REVASCULARIZATION N/A 05/13/2021   Procedure: Coronary/Graft Acute MI Revascularization;  Surgeon: Nelva Bush, MD;  Location: [facility];  Service: Cardiovascular;  Laterality: N/A;   CYSTOSCOPY WITH LITHOLAPAXY N/A 08/14/2016   Procedure: CYSTOSCOPY WITH LITHOLAPAXY;  Surgeon: Nickie Retort, MD;  Location: [facility];  Service: Urology;  Laterality: N/A;   FEMUR IM NAIL Right 10/08/2014   Procedure: INTRAMEDULLARY (IM) NAIL FEMORAL;  Surgeon: Rod Can, MD;  Location: [facility];  Service: Orthopedics;  Laterality: Right;  PER DANIELLE 110 MIN   HOLMIUM LASER APPLICATION  10/16/4006   Procedure: HOLMIUM LASER APPLICATION;  Surgeon: Nickie Retort, MD;  Location: [facility];  Service: Urology;;   LEFT HEART CATH AND CORONARY ANGIOGRAPHY N/A 05/13/2021   Procedure: LEFT HEART CATH AND CORONARY ANGIOGRAPHY;  Surgeon: Nelva Bush, MD;  Location: [facility];  Service: Cardiovascular;  Laterality: N/A;   OTHER SURGICAL HISTORY  2006   Prostate Radiation Surgery    RADIOACTIVE SEED IMPLANT N/A 10/30/2019   Procedure: RADIOACTIVE SEED IMPLANT/BRACHYTHERAPY IMPLANT;  Surgeon: Billey Co, MD;  Location: [facility];  Service: Urology;  Laterality: N/A;  41 seeds implanted    Home Medications:  Current Meds  Medication Sig   amoxicillin (AMOXIL) 500 MG capsule Take 1 capsule (500 mg total) by mouth 2 (two) times daily.   aspirin 81 MG chewable tablet Chew 1 tablet (81 mg total) by mouth daily.   atorvastatin (LIPITOR) 80 MG tablet Take 1 tablet (80 mg total) by mouth daily.    clopidogrel (PLAVIX) 75 MG tablet Take 1 tablet (75 mg total) by mouth daily.   losartan (COZAAR) 25 MG tablet Take 1 tablet (25 mg total) by mouth daily.   tamsulosin (FLOMAX) 0.4 MG CAPS capsule Take 0.4 mg by mouth daily after supper.    Allergies: No Known Allergies  Family History  Problem Relation Age of Onset   Heart disease Brother    Heart attack Brother    Lung cancer Brother    Prostate cancer Neg Hx    Bladder Cancer Neg Hx    Kidney cancer Neg Hx     Social History:  reports that he quit smoking about 56 years ago. His smoking use included cigarettes. He has a 15.00 pack-year smoking history. He has been exposed to tobacco smoke. He has never used smokeless tobacco. He reports current alcohol use of about 7.0 standard drinks of alcohol per week. He reports that he does not use drugs.  ROS: A complete review of systems was  performed.  All systems are negative except for pertinent findings as noted.  Physical Exam:  Vital signs in last 24 hours: Temp:  [97.6 F (36.4 C)-98.4 F (36.9 C)] 98.4 F (36.9 C) (08/12 1200) Pulse Rate:  [60-94] 86 (08/12 1200) Resp:  [11-20] 17 (08/12 1200) BP: (132-190)/(59-96) 181/83 (08/12 1200) SpO2:  [96 %-100 %] 100 % (08/12 1200) Weight:  [87.2 kg] 87.2 kg (08/11 2210) Constitutional:  Alert and oriented, No acute distress HEENT: [place] AT, moist mucus membranes.  Trachea midline, no masses Cardiovascular: Regular rate and rhythm, no clubbing, cyanosis, or edema. Respiratory: Normal respiratory effort, lungs clear bilaterally GI: Abdomen is soft, nontender, nondistended, no abdominal masses GU: Circumcised phallus with orthotopic meatus. Neurologic: Grossly intact, no focal deficits, moving all 4 extremities Psychiatric: Normal mood and affect   Laboratory Data:  Recent Labs    03/13/22 2213 03/14/22 0756  WBC 8.6 6.6  HGB 9.8* 7.8*  HCT 31.5* 24.7*   Recent Labs    03/13/22 2213  NA 133*  K 4.5  CL 101  CO2 22   GLUCOSE 133*  BUN 27*  CREATININE 1.51*  CALCIUM 9.1   Recent Labs    03/14/22 0756  INR 1.2   No results for input(s): "LABURIN" in the last 72 hours. Results for orders placed or performed during the hospital encounter of 02/20/22  Urine Culture     Status: Abnormal   Collection Time: 02/20/22  1:34 PM   Specimen: Urine, Clean Catch  Result Value Ref Range Status   Specimen Description   Final    URINE, CLEAN CATCH Performed at [facility], [address], [place], [place] [address]    Special Requests   Final    NONE Performed at [facility], [address] [place], [place] [address]    Culture >=100,000 COLONIES/mL AEROCOCCUS URINAE (A)  Final   Report Status 02/23/2022 FINAL  Final  Blood culture (routine x 2)     Status: None   Collection Time: 02/20/22  7:45 PM   Specimen: BLOOD  Result Value Ref Range Status   Specimen Description BLOOD RIGHT ANTECUBITAL  Final   Special Requests   Final    BOTTLES DRAWN AEROBIC AND ANAEROBIC Blood Culture results may not be optimal due to an excessive volume of blood received in culture bottles   Culture   Final    NO GROWTH 5 DAYS Performed at [facility], [address], [place], [place] [address]    Report Status 02/25/2022 FINAL  Final  Blood culture (routine x 2)     Status: None   Collection Time: 02/20/22  8:04 PM   Specimen: BLOOD  Result Value Ref Range Status   Specimen Description BLOOD RIGHT ANTECUBITAL  Final   Special Requests   Final    BOTTLES DRAWN AEROBIC ONLY Blood Culture adequate volume   Culture   Final    NO GROWTH 5 DAYS Performed at [facility], [address] [place], [place] [address]    Report Status 02/25/2022 FINAL  Final     Radiologic Imaging: US PELVIS LIMITED (TRANSABDOMINAL ONLY)  Result Date: 03/14/2022 CLINICAL DATA:  Hematuria. EXAM: LIMITED ULTRASOUND OF PELVIS TECHNIQUE: Limited transabdominal ultrasound examination of the pelvis  was performed. COMPARISON:  None Available. FINDINGS: A masslike region in the bladder is likely a large clot given history. The bladder is distended with a volume of 476 cc. No other abnormalities. IMPRESSION: The rounded masslike region in  the bladder is probably a clot given history. Recommend follow-up after resolution of symptoms to ensure resolution. The bladder is distended with a volume of 476 cc. Electronically Signed   By: Dorise Bullion III M.D.   On: 03/14/2022 12:44    Pelvic ultrasound was reviewed just before entering the patient's room.  Large clot burden.  Impression/plan:  1.  Gross hematuria/urinary retention-likely multifactorial including catheter trauma, possible underlying infectious cystitis, radiation cystitis and untreated metastatic prostate cancer exacerbated by antiplatelet therapy.  He is now status post hematuria catheter placement, clot evacuation at bedside and now appears to have minimal bleeding on slow drip CBI.  We will plan to titrate this off tomorrow.  Follow-up urine culture, agree with antibiotics for presumed recurrent cystitis especially the setting of dysuria and suspicious urinalysis.  If CBI stops, please perform hand irrigation until cleared and then resume slow drip CBI as possible.  2.  Metastatic prostate cancer-we had a frank discussion today at bedside as his untreated prostate cancer is likely contributing factor.  I strongly urged him to consider resumption of ADT, possibly at reduced dose to help him tolerate the side effects.  Will defer this discussion further to Dr. Janese Banks and Dr. Diamantina Providence.  3.  History of CAD status post STEMI in 05/2021: Past admission, patient was advised to remain on Plavix /aspirin due to his complex history.  Current active bleeding does not appear to be severe as he is on a relatively slow drip this okay to continue these for the time being but low threshold to stop if he continues to bleed significantly.   03/14/2022,  1:46 PM  Hollice Espy,  MD

## 2022-03-14 NOTE — H&P (Addendum)
History and Physical    Cesar Powell CHY:[id number] DOB: 1931/03/31 DOA: 03/14/2022  Referring MD/NP/PA:   PCP: Owens Loffler, MD   Patient coming from:  The patient is coming from home.  At baseline, pt is independent for most of ADL.        Chief Complaint: hematuria, dysuria, difficulty urinating  HPI: Cesar Powell is a 88 y.o. male with medical history significant of CAD on ASA and plavix, prostate cancer, HTN, HLD, CKD-3B, CHF, GERD, who presents with hematuria, dysuria, difficulty urinating  Patient was recently hospitalized from 7/21 - 7/24 due to hematuria secondary to hemorrhagic cystitis.  CT per renal stone protocol on 7/21 was negative for kidney stone hydronephrosis.  Patient presents with hematuria again today.  Patient states that he has difficulty urinating, dysuria, burning on urination.  He has gross hematuria.  No nausea, vomiting, diarrhea or abdominal pain.  No fever or chills.  Denies chest pain, cough, shortness of breath.  Patient is currently taking amoxicillin at home.  Patient was found to have urinary retention and gross hematuria in ED. Three way Foley was placed and started CBI in ED.  Data reviewed independently and ED Course: pt was found to have hemoglobin 9.8 (9.7 on 02/02/2022), positive urinalysis (cloudy appearance, many bacteria, WBC> 50), worsening renal function with creatinine 1.51, BUN 27, GFR 44 (baseline creatinine 1.02 on 03/02/2022), temperature 99, blood pressure 157/64, heart rate 89, RR 20, oxygen saturation 96% on room air.  Patient is admitted to telemetry bed as inpt.  Dr. Erlene Quan of urology is consulted.   EKG: Reviewed independently, sinus rhythm, QTc 461, T wave inversion in lead III/aVF and V6, low voltage, early R wave progression, occasional PVC.   Review of Systems:   General: no fevers, chills, no body weight gain, has fatigue HEENT: no blurry vision, hearing changes or sore throat Respiratory: no dyspnea, coughing,  wheezing CV: no chest pain, no palpitations GI: no nausea, vomiting, abdominal pain, diarrhea, constipation GU: has dysuria, burning on urination, difficulty urinating, hematuria  Ext: has trace leg edema Neuro: no unilateral weakness, numbness, or tingling, no vision change or hearing loss Skin: no rash, no skin tear. MSK: No muscle spasm, no deformity, no limitation of range of movement in spin Heme: No easy bruising.  Travel history: No recent long distant travel.   Allergy: No Known Allergies  Past Medical History:  Diagnosis Date   Coronary artery disease involving native coronary artery of native heart 08/25/2021   H/O prostate cancer    was treated with radiation   Hypertension    Local recurrence of prostate cancer ([place]) 06/21/2004   Qualifier: Diagnosis of  By: Fuller Plan CMA (AAMA), [place]     Myocardial infarction ([place]) 2022   Obstructive sleep apnea 06/22/2007   NPSG [place] 1979 Unattended Home sleep Study- 05/10/14- Confirms severe OSA, AHI 41.8/ hr, weight 230 pounds CPAP and also Transcend portable CPAP auto 8-15     Sleep apnea 1977   uses C-pap     Past Surgical History:  Procedure Laterality Date   ABDOMINAL SURGERY     ruptured intestines    CATARACT EXTRACTION W/PHACO Left 01/29/2016   Procedure: CATARACT EXTRACTION PHACO AND INTRAOCULAR LENS PLACEMENT ([place]) left eye;  Surgeon: Leandrew Koyanagi, MD;  Location: [place];  Service: Ophthalmology;  Laterality: Left;  RESTOR LENS   CATARACT EXTRACTION W/PHACO Right 10/21/2016   Procedure: CATARACT EXTRACTION PHACO AND INTRAOCULAR LENS PLACEMENT ([facility])  Right restor toric lens;  Surgeon: Leandrew Koyanagi, MD;  Location: [place];  Service: Ophthalmology;  Laterality: Right;  restor toric lens   CORONARY/GRAFT ACUTE MI REVASCULARIZATION N/A 05/13/2021   Procedure: Coronary/Graft Acute MI Revascularization;  Surgeon: Nelva Bush, MD;  Location: [place] [facility];  Service:  Cardiovascular;  Laterality: N/A;   CYSTOSCOPY WITH LITHOLAPAXY N/A 08/14/2016   Procedure: CYSTOSCOPY WITH LITHOLAPAXY;  Surgeon: Nickie Retort, MD;  Location: [facility];  Service: Urology;  Laterality: N/A;   FEMUR IM NAIL Right 10/08/2014   Procedure: INTRAMEDULLARY (IM) NAIL FEMORAL;  Surgeon: Rod Can, MD;  Location: [place];  Service: Orthopedics;  Laterality: Right;  PER DANIELLE 110 MIN   HOLMIUM LASER APPLICATION  2/77/8242   Procedure: HOLMIUM LASER APPLICATION;  Surgeon: Nickie Retort, MD;  Location: [facility];  Service: Urology;;   LEFT HEART CATH AND CORONARY ANGIOGRAPHY N/A 05/13/2021   Procedure: LEFT HEART CATH AND CORONARY ANGIOGRAPHY;  Surgeon: Nelva Bush, MD;  Location: [place] [facility];  Service: Cardiovascular;  Laterality: N/A;   OTHER SURGICAL HISTORY  2006   Prostate Radiation Surgery    RADIOACTIVE SEED IMPLANT N/A 10/30/2019   Procedure: RADIOACTIVE SEED IMPLANT/BRACHYTHERAPY IMPLANT;  Surgeon: Billey Co, MD;  Location: [facility];  Service: Urology;  Laterality: N/A;  41 seeds implanted    Social History:  reports that he quit smoking about 56 years ago. His smoking use included cigarettes. He has a 15.00 pack-year smoking history. He has been exposed to tobacco smoke. He has never used smokeless tobacco. He reports current alcohol use of about 7.0 standard drinks of alcohol per week. He reports that he does not use drugs.  Family History:  Family History  Problem Relation Age of Onset   Heart disease Brother    Heart attack Brother    Lung cancer Brother    Prostate cancer Neg Hx    Bladder Cancer Neg Hx    Kidney cancer Neg Hx      Prior to Admission medications   Medication Sig Start Date End Date Taking? Authorizing Provider  amoxicillin (AMOXIL) 500 MG capsule Take 1 capsule (500 mg total) by mouth 2 (two) times daily. 03/12/22  Yes Billey Co, MD  aspirin 81 MG chewable tablet Chew 1 tablet (81 mg total) by mouth daily.  05/16/21 05/16/22 Yes Jennye Boroughs, MD  atorvastatin (LIPITOR) 80 MG tablet Take 1 tablet (80 mg total) by mouth daily. 08/25/21  Yes Minna Merritts, MD  clopidogrel (PLAVIX) 75 MG tablet Take 1 tablet (75 mg total) by mouth daily. 02/23/22 02/23/23 Yes Wieting, Richard, MD  losartan (COZAAR) 25 MG tablet Take 1 tablet (25 mg total) by mouth daily. 02/23/22  Yes Wieting, Richard, MD  tamsulosin (FLOMAX) 0.4 MG CAPS capsule Take 0.4 mg by mouth daily after supper.   Yes [provider]  nitroGLYCERIN (NITROSTAT) 0.4 MG SL tablet Place 0.4 mg under the tongue every 5 (five) minutes as needed for chest pain.    [provider]  pantoprazole (PROTONIX) 40 MG tablet Take 1 tablet (40 mg total) by mouth 2 (two) times daily before a meal. Patient not taking: Reported on 03/14/2022 12/13/21   Damita Lack, MD    Physical Exam: Vitals:   03/14/22 1400 03/14/22 1430 03/14/22 1500 03/14/22 1530  BP: 135/61 (!) 151/51 (!) 131/56 126/65  Pulse: 70 65 66 71  Resp: '15 16 15 15  '$ Temp:      TempSrc:      SpO2:  95% 100% 100% 100%  Weight:      Height:       General: Not in acute distress HEENT:       Eyes: PERRL, EOMI, no scleral icterus.       ENT: No discharge from the ears and nose, no pharynx injection, no tonsillar enlargement.        Neck: No JVD, no bruit, no mass felt. Heme: No neck lymph node enlargement. Cardiac: S1/S2, RRR, No murmurs, No gallops or rubs. Respiratory: No rales, wheezing, rhonchi or rubs. GI: Soft, nondistended, nontender, no rebound pain, no organomegaly, BS present. GU: has gross hematuria Ext: has trace leg edema bilaterally. 1+DP/PT pulse bilaterally. Musculoskeletal: No joint deformities, No joint redness or warmth, no limitation of ROM in spin. Skin: No rashes.  Neuro: Alert, oriented X3, cranial nerves II-XII grossly intact, moves all extremities normally.  Psych: Patient is not psychotic, no suicidal or hemocidal ideation.  Labs on  Admission: I have personally reviewed following labs and imaging studies  CBC: Recent Labs  Lab 03/13/22 2213 03/14/22 0756  WBC 8.6 6.6  NEUTROABS 7.0  --   HGB 9.8* 7.8*  HCT 31.5* 24.7*  MCV 92.6 90.5  PLT 316 102   Basic Metabolic Panel: Recent Labs  Lab 03/13/22 2213  NA 133*  K 4.5  CL 101  CO2 22  GLUCOSE 133*  BUN 27*  CREATININE 1.51*  CALCIUM 9.1   GFR: Estimated Creatinine Clearance: 35.3 mL/min (A) (by C-G formula based on SCr of 1.51 mg/dL (H)). Liver Function Tests: Recent Labs  Lab 03/13/22 2213  AST 22  ALT 14  ALKPHOS 61  BILITOT 0.5  PROT 8.0  ALBUMIN 3.9   No results for input(s): "LIPASE", "AMYLASE" in the last 168 hours. No results for input(s): "AMMONIA" in the last 168 hours. Coagulation Profile: Recent Labs  Lab 03/14/22 0756  INR 1.2   Cardiac Enzymes: No results for input(s): "CKTOTAL", "CKMB", "CKMBINDEX", "TROPONINI" in the last 168 hours. BNP (last 3 results) No results for input(s): "PROBNP" in the last 8760 hours. HbA1C: No results for input(s): "HGBA1C" in the last 72 hours. CBG: No results for input(s): "GLUCAP" in the last 168 hours. Lipid Profile: No results for input(s): "CHOL", "HDL", "LDLCALC", "TRIG", "CHOLHDL", "LDLDIRECT" in the last 72 hours. Thyroid Function Tests: No results for input(s): "TSH", "T4TOTAL", "FREET4", "T3FREE", "THYROIDAB" in the last 72 hours. Anemia Panel: No results for input(s): "VITAMINB12", "FOLATE", "FERRITIN", "TIBC", "IRON", "RETICCTPCT" in the last 72 hours. Urine analysis:    Component Value Date/Time   COLORURINE RED (A) 03/14/2022 0351   APPEARANCEUR CLOUDY (A) 03/14/2022 0351   APPEARANCEUR Cloudy (A) 08/13/2021 1047   LABSPEC 1.034 (H) 03/14/2022 0351   PHURINE  03/14/2022 0351    TEST NOT REPORTED DUE TO COLOR INTERFERENCE OF URINE PIGMENT   GLUCOSEU (A) 03/14/2022 0351    TEST NOT REPORTED DUE TO COLOR INTERFERENCE OF URINE PIGMENT   HGBUR (A) 03/14/2022 0351    TEST  NOT REPORTED DUE TO COLOR INTERFERENCE OF URINE PIGMENT   HGBUR negative 06/13/2007 1105   BILIRUBINUR (A) 03/14/2022 0351    TEST NOT REPORTED DUE TO COLOR INTERFERENCE OF URINE PIGMENT   BILIRUBINUR Negative 08/13/2021 1047   KETONESUR (A) 03/14/2022 0351    TEST NOT REPORTED DUE TO COLOR INTERFERENCE OF URINE PIGMENT   PROTEINUR (A) 03/14/2022 0351    TEST NOT REPORTED DUE TO COLOR INTERFERENCE OF URINE PIGMENT   UROBILINOGEN 0.2 06/01/2016 0953  UROBILINOGEN negative 06/13/2007 1105   NITRITE (A) 03/14/2022 0351    TEST NOT REPORTED DUE TO COLOR INTERFERENCE OF URINE PIGMENT   LEUKOCYTESUR (A) 03/14/2022 0351    TEST NOT REPORTED DUE TO COLOR INTERFERENCE OF URINE PIGMENT   Sepsis Labs: '@LABRCNTIP'$ (procalcitonin:4,lacticidven:4) )No results found for this or any previous visit (from the past 240 hour(s)).   Radiological Exams on Admission: US PELVIS LIMITED (TRANSABDOMINAL ONLY)  Result Date: 03/14/2022 CLINICAL DATA:  Hematuria. EXAM: LIMITED ULTRASOUND OF PELVIS TECHNIQUE: Limited transabdominal ultrasound examination of the pelvis was performed. COMPARISON:  None Available. FINDINGS: A masslike region in the bladder is likely a large clot given history. The bladder is distended with a volume of 476 cc. No other abnormalities. IMPRESSION: The rounded masslike region in the bladder is probably a clot given history. Recommend follow-up after resolution of symptoms to ensure resolution. The bladder is distended with a volume of 476 cc. Electronically Signed   By: Dorise Bullion III M.D.   On: 03/14/2022 12:44      Assessment/Plan Principal Problem:   Gross hematuria Active Problems:   UTI (urinary tract infection)   Urinary retention   Essential hypertension   Coronary artery disease involving native coronary artery of native heart   Chronic diastolic CHF (congestive heart failure) (HCC)   Acute renal failure superimposed on stage 3b chronic kidney disease (HCC)   Prostate  cancer (HCC)   Normocytic anemia   H/O prostate cancer   Sleep apnea   Assessment and Plan: * Gross hematuria Gross hematuria, urinary retention and UTI: Patient seems to have hemorrhagic cystitis again.  Urinalysis positive for UTI.  Consulted Dr. Erlene Quan of urology  -Placed on telemetry bed for observation -Three way Foley was placed and started CBI in ED -Monitor electrolytes closely with BMP -Follow-up urology's recommendation -IV Rocephin for UTI -Follow-up urine culture -Flomax -Check INR/PTT/type screen  UTI (urinary tract infection) -see above  Urinary retention -see above  Coronary artery disease involving native coronary artery of native heart No chest pain -Hold Plavix due to hematuria -will continue ASA 81 mg daily -Continue Lipitor  Essential hypertension - IV hydralazine as needed -Hold Cozaar due to worsening renal function  Chronic diastolic CHF (congestive heart failure) ([place]) 2D echo on 05/15/2021 showed EF of 55% with grade 1 diastolic dysfunction.  Patient has trace leg edema, no shortness of breath.  Does not seem to have CHF exacerbation. -Check BNP  Acute renal failure superimposed on stage 3b chronic kidney disease ([place]) Likely due to urinary retention and UTI -Follow-up renal function by BMP -Hold cozarr  Normocytic anemia Hemoglobin 9.8 today (9.7 on 02/02/2022) --> 7.8 --> -Follow-up CBC every 6 hours -will transfuse blood if Hgb is < 7.0  H/O prostate cancer S/p of XRT.  -f/u with urology and oncology.          DVT ppx: SCD  Code Status: Full code per pt  Family Communication: I have tried to call patient's daughter who did not pick up the phone.  I left a message to her.  Disposition Plan:  Anticipate discharge back to previous environment  Consults called:  Dr. Erlene Quan of urology  Admission status and Level of care: Telemetry Medical:     for obs    Severity of Illness:  The appropriate patient status for this  patient is INPATIENT. Inpatient status is judged to be reasonable and necessary in order to provide the required intensity of service to ensure the patient's safety. The patient's  presenting symptoms, physical exam findings, and initial radiographic and laboratory data in the context of their chronic comorbidities is felt to place them at high risk for further clinical deterioration. Furthermore, it is not anticipated that the patient will be medically stable for discharge from the hospital within 2 midnights of admission.   * I certify that at the point of admission it is my clinical judgment that the patient will require inpatient hospital care spanning beyond 2 midnights from the point of admission due to high intensity of service, high risk for further deterioration and high frequency of surveillance required.*    Date of Service 03/14/2022    Ivor Costa [facility]   If 7PM-7AM, please contact night-coverage [email or web address] 03/14/2022, 4:04 PM

## 2022-03-14 NOTE — Assessment & Plan Note (Signed)
Likely due to urinary retention and UTI -Follow-up renal function by BMP -Hold cozarr

## 2022-03-14 NOTE — ED Notes (Signed)
Pt stated he is in misery and is unable to pass urine

## 2022-03-14 NOTE — Assessment & Plan Note (Signed)
-   IV hydralazine as needed -Hold Cozaar due to worsening renal function

## 2022-03-14 NOTE — ED Provider Notes (Signed)
[facility] Provider Note    Event Date/Time   First MD Initiated Contact with Patient 03/14/22 [phone]     (approximate)   History   Hematuria   HPI  Cesar Powell is a 88 y.o. male who presents for evaluation of hematuria as well as difficulty urinating.  Was recently admitted to the hospital for hemorrhagic cystitis.  He takes 2 blood thinners (dual antiplatelet therapy) for his heart disease and that seems to be contributing as well (he was previously on Brilinta).  He was discharged from the hospital within the last couple of weeks and has been doing okay.  He does not self catheter regularly, but he has done so in the past.  Earlier today he discovered that he was not able to urinate and he was started to feel a lot of pressure in his abdomen.  This did not occur the last time he was admitted for hematuria.  He attempted to self cath and when he did so, he had "nothing but blood come out", so he stopped.  He then came to the emergency department.  He otherwise feels well with no shortness of breath or chest pain.  He has not had any lightheadedness or dizziness although he has felt more tired recently.  However he feels that his abdomen is distended and he feels like he needs to urinate but is not able to do so.  When he tries to do so, he also has quite a bit of burning in his urine.     Physical Exam   Triage Vital Signs: ED Triage Vitals [03/13/22 2210]  Enc Vitals Group     BP (!) 139/96     Pulse Rate 89     Resp 20     Temp 97.9 F (36.6 C)     Temp Source Oral     SpO2 98 %     Weight 87.2 kg (192 lb 4 oz)     Height 1.715 m (5' 7.5")     Head Circumference      Peak Flow      Pain Score 5     Pain Loc      Pain Edu?      Excl. in [place]?     Most recent vital signs: Vitals:   03/14/22 0530 03/14/22 0730  BP: (!) 157/64 (!) 160/65  Pulse: 63 62  Resp: 16 20  Temp:    SpO2: 99% 100%     General: Awake, no distress.  CV:  Good  peripheral perfusion.  Resp:  Normal effort.  Abd:  Some mild suprapubic distention and tenderness to palpation.  No peritonitis.   ED Results / Procedures / Treatments   Labs (all labs ordered are listed, but only abnormal results are displayed) Labs Reviewed  URINALYSIS, ROUTINE W REFLEX MICROSCOPIC - Abnormal; Notable for the following components:      Result Value   Color, Urine RED (*)    APPearance CLOUDY (*)    Specific Gravity, Urine 1.034 (*)    Glucose, UA   (*)    Value: TEST NOT REPORTED DUE TO COLOR INTERFERENCE OF URINE PIGMENT   Hgb urine dipstick   (*)    Value: TEST NOT REPORTED DUE TO COLOR INTERFERENCE OF URINE PIGMENT   Bilirubin Urine   (*)    Value: TEST NOT REPORTED DUE TO COLOR INTERFERENCE OF URINE PIGMENT   Ketones, ur   (*)    Value: TEST  NOT REPORTED DUE TO COLOR INTERFERENCE OF URINE PIGMENT   Protein, ur   (*)    Value: TEST NOT REPORTED DUE TO COLOR INTERFERENCE OF URINE PIGMENT   Nitrite   (*)    Value: TEST NOT REPORTED DUE TO COLOR INTERFERENCE OF URINE PIGMENT   Leukocytes,Ua   (*)    Value: TEST NOT REPORTED DUE TO COLOR INTERFERENCE OF URINE PIGMENT   RBC / HPF >50 (*)    WBC, UA >50 (*)    Bacteria, UA MANY (*)    All other components within normal limits  CBC WITH DIFFERENTIAL/PLATELET - Abnormal; Notable for the following components:   RBC 3.40 (*)    Hemoglobin 9.8 (*)    HCT 31.5 (*)    RDW 17.1 (*)    Lymphs Abs 0.6 (*)    All other components within normal limits  COMPREHENSIVE METABOLIC PANEL - Abnormal; Notable for the following components:   Sodium 133 (*)    Glucose, Bld 133 (*)    BUN 27 (*)    Creatinine, Ser 1.51 (*)    GFR, Estimated 44 (*)    All other components within normal limits  URINE CULTURE  PROTIME-INR  APTT  CBC  CBC  CBC  BRAIN NATRIURETIC PEPTIDE  TYPE AND SCREEN     EKG  No indication for emergent EKG   RADIOLOGY No indication for emergent imaging    PROCEDURES:  Critical Care  performed: No  Procedures   MEDICATIONS ORDERED IN ED: Medications  ondansetron (ZOFRAN) injection 4 mg (has no administration in time range)  acetaminophen (TYLENOL) tablet 650 mg (has no administration in time range)  cefTRIAXone (ROCEPHIN) 1 g in sodium chloride 0.9 % 100 mL IVPB (has no administration in time range)  atorvastatin (LIPITOR) tablet 80 mg (has no administration in time range)  nitroGLYCERIN (NITROSTAT) SL tablet 0.4 mg (has no administration in time range)  tamsulosin (FLOMAX) capsule 0.4 mg (has no administration in time range)  hydrALAZINE (APRESOLINE) injection 5 mg (has no administration in time range)  oxyCODONE-acetaminophen (PERCOCET/ROXICET) 5-325 MG per tablet 1 tablet (1 tablet Oral Given 03/14/22 0009)  oxybutynin (DITROPAN) tablet 2.5 mg (2.5 mg Oral Given 03/14/22 0649)     IMPRESSION / MDM / ASSESSMENT AND PLAN / ED COURSE  I reviewed the triage vital signs and the nursing notes.                              Differential diagnosis includes, but is not limited to, recurrent hemorrhagic cystitis, acute urinary retention, bladder outlet obstruction, UTI/pyelonephritis.  The patient is not in any pain or distress except that he is having acute retention and cannot urinate.  Vital signs are stable other than hypertension.  No indication of acute infection and I believe that the dysuria he experiences when he is able to pass some urine is due to the blood.  Bladder scan showed more than 300 mL of liquid in his bladder.  I ordered an in and out catheterization, and the nurse informed me that about 300 milliliters of what looked like pure blood came out.  I then observed in for a few hours and he said he was starting to feel like he needed to go again but he once again cannot urinate.  Labs ordered include CMP, CBC with differential, type and screen, urinalysis, and urine culture.  CMP is essentially normal other than some possible AKI with  a creatinine of 1.5.   Urinalysis shows what appears to be gross hematuria although there is some bacteria identified.  Urine culture is pending and I will hold off treating empirically until we can target antibiotics if they are in fact necessary based on the presence of sufficient colony-forming units.  CBC shows a stable hemoglobin at this time, but the patient is having a significant amount of gross blood output.  He will need serial H&Hs.  Given his persistent acute urinary retention and gross hematuria on dual antiplatelet agents, I believe he is at significant risk of acute decompensation with morbidity/mortality.  He needs to be admitted to the hospital for further observation and management as well as urological consultation.  I ordered three-way catheter placement and continuous bladder irrigation and I will consult the hospitalist for admission.  Patient's presentation is most consistent with acute presentation with potential threat to life or bodily function.     Clinical Course as of 03/14/22 4888  Sat Mar 14, 2022  0555 Consulted by secure chat text with Dr. Sidney Ace with the hospitalist service.  He understands and agrees with the plan and will admit the patient. [CF]    Clinical Course User Index [CF] Hinda Kehr, MD     FINAL CLINICAL IMPRESSION(S) / ED DIAGNOSES   Final diagnoses:  Hemorrhagic cystitis  Acute urinary retention     Rx / DC Orders   ED Discharge Orders     None        Note:  This document was prepared using Dragon voice recognition software and may include unintentional dictation errors.   Hinda Kehr, MD 03/14/22 [phone]

## 2022-03-14 NOTE — Assessment & Plan Note (Signed)
S/p of XRT.  -f/u with urology and oncology.

## 2022-03-14 NOTE — ED Notes (Signed)
This RN notified Erlene Quan, MD about patient's bladder discomfort. This RN bladder scanned pt and scanner displays ">285m". Pt states "something needs to be done".

## 2022-03-14 NOTE — ED Notes (Signed)
IV team at bedside, blood work obtained by this RN and sent to lab.

## 2022-03-14 NOTE — Assessment & Plan Note (Signed)
2D echo on 05/15/2021 showed EF of 55% with grade 1 diastolic dysfunction.  Patient has trace leg edema, no shortness of breath.  Does not seem to have CHF exacerbation. -Check BNP

## 2022-03-15 DIAGNOSIS — R310 Gross hematuria: Secondary | ICD-10-CM | POA: Diagnosis not present

## 2022-03-15 LAB — CBC
HCT: 26.3 % — ABNORMAL LOW (ref 39.0–52.0)
Hemoglobin: 8.5 g/dL — ABNORMAL LOW (ref 13.0–17.0)
MCH: 29.4 pg (ref 26.0–34.0)
MCHC: 32.3 g/dL (ref 30.0–36.0)
MCV: 91 fL (ref 80.0–100.0)
Platelets: 238 10*3/uL (ref 150–400)
RBC: 2.89 MIL/uL — ABNORMAL LOW (ref 4.22–5.81)
RDW: 17 % — ABNORMAL HIGH (ref 11.5–15.5)
WBC: 5 10*3/uL (ref 4.0–10.5)
nRBC: 0 % (ref 0.0–0.2)

## 2022-03-15 LAB — BASIC METABOLIC PANEL
Anion gap: 6 (ref 5–15)
BUN: 22 mg/dL (ref 8–23)
CO2: 19 mmol/L — ABNORMAL LOW (ref 22–32)
Calcium: 7.6 mg/dL — ABNORMAL LOW (ref 8.9–10.3)
Chloride: 112 mmol/L — ABNORMAL HIGH (ref 98–111)
Creatinine, Ser: 1.23 mg/dL (ref 0.61–1.24)
GFR, Estimated: 56 mL/min — ABNORMAL LOW (ref >60)
Glucose, Bld: 99 mg/dL (ref 70–99)
Potassium: 3.7 mmol/L (ref 3.5–5.1)
Sodium: 137 mmol/L (ref 135–145)

## 2022-03-15 LAB — HEMOGLOBIN AND HEMATOCRIT, BLOOD
HCT: 26.9 % — ABNORMAL LOW (ref 39.0–52.0)
Hemoglobin: 8.4 g/dL — ABNORMAL LOW (ref 13.0–17.0)

## 2022-03-15 LAB — HEMOGLOBIN
Hemoglobin: 8.7 g/dL — ABNORMAL LOW (ref 13.0–17.0)
Hemoglobin: 8.6 g/dL — ABNORMAL LOW (ref 13.0–17.0)

## 2022-03-15 LAB — PREPARE RBC (CROSSMATCH)

## 2022-03-15 NOTE — Progress Notes (Signed)
[employer] Data - midnight lab work showed over 2 gm drop in hgb in 24 hours  Actione - unit of PRBC ordered for transfusion  Response - HGB this am improved to  8.4 from 7.4. Urine in foley remains pink

## 2022-03-15 NOTE — ED Notes (Signed)
Cesar Settler, np notified of drop in hemoglobin, order for unit of blood received.

## 2022-03-15 NOTE — ED Notes (Signed)
Pt continues to sleep, lightly bloody urine draining into drainage bag.

## 2022-03-15 NOTE — ED Notes (Signed)
Pt draining clear fluid from foley, rate of CBI decreased will continue to monitor

## 2022-03-15 NOTE — ED Notes (Signed)
Pt continues to drain light pink tinged urine in foley drainage bag.

## 2022-03-15 NOTE — ED Notes (Signed)
Report to [place], rn.

## 2022-03-15 NOTE — ED Notes (Signed)
Blood administration rate increased to 146m/hr to complete unit in two hours as ordered. Pt tolerating.

## 2022-03-15 NOTE — ED Notes (Signed)
Bladder continues to irrigate light pink. Pt with no pain or needs.

## 2022-03-15 NOTE — ED Notes (Signed)
Pt continues to drain light pink in foley drainage bag. Pt sleeping, resps unlabored.

## 2022-03-15 NOTE — ED Notes (Signed)
Draining lightly pink clear in drainage bag. Pt sleeping.

## 2022-03-15 NOTE — ED Notes (Signed)
Urine is more bloody, no longer lightly tinged, but darker, rate of CBI increased, pt tolerating.

## 2022-03-15 NOTE — ED Notes (Signed)
Blood transfusion begun.

## 2022-03-15 NOTE — Progress Notes (Signed)
Urology progress  Patient doing well, still in the emergency room awaiting bed.  Rate was adjusted overnight once by the nurse to higher drip without irrigation first.  Status post 1 unit packed red blood cells.  Comfortable with no complaints.  Vitals reviewed Abdomen and suprapubic area soft, nontender 24 French hematuria Foley catheter in place with light pink effluent on moderate drip, hand irrigated with 500 cc of  sterile water with some old small clots which cleared very quickly.  Able to resume CBI on slow drip.  Labs reviewed  Assessment and plan:  1.  Gross hematuria- - Appears to be in being significantly - Orders placed to hand irrigate every shift and before any CBI titration - We will work to titrate CBI off in the a.m. and consider voiding trial - Agree with holding Plavix for the time being, continue aspir in - Continue to treat UTI; follow-up urine culture data currently on Rocephin - Continue to trend hemoglobin and supportive care as needed with transfusion  2.  Prostate cancer, metastatic - Outpatient discussion whether or not to resume ADT, would highly encourage this  3.  Urinary retention - Continue Flomax Consider void trial versus downsizing catheter at the time of discharge, patient interested in Foley  removal upon discussion today  Plan to see patient in the morning and titrate off CBI.

## 2022-03-15 NOTE — Progress Notes (Signed)
Patient arrived to unit. No distress noted. No complaints voiced. CBI noted. Red tinged urine noted in foley bag.

## 2022-03-15 NOTE — ED Notes (Signed)
Assumed care, pt alert and oriented with no needs. CBI currently faint pink. Will monitor.

## 2022-03-15 NOTE — ED Notes (Addendum)
Pt sleeping,  very lightly bloody urine continues to drain into foley drainage bag.

## 2022-03-15 NOTE — Progress Notes (Signed)
  Progress Note   Patient: Cesar Powell SLH:[id number] DOB: [birth year] DOA: 03/14/2022     1 DOS: the patient was seen and examined on 03/15/2022   Brief hospital course: Cesar Powell is a 88 y.o. male with medical history significant of CAD on ASA and plavix, prostate cancer, HTN, HLD, CKD-3B, CHF, GERD, who presents with hematuria, dysuria, difficulty urinating   Patient was recently hospitalized from 7/21 - 7/24 due to hematuria secondary to hemorrhagic cystitis.  CT per renal stone protocol on 7/21 was negative for kidney stone hydronephrosis.  Patient presents with hematuria again today.  Assessment and Plan: * Gross hematuria Gross hematuria, urinary retention and UTI: Patient seems to have hemorrhagic cystitis with h/o radiation and chronic bladder wall thickening as noted on Imaging studies -- Urinalysis positive for UTI.   --Appreciated Consult with Dr. Erlene Quan of urology-- 200 mL of blood clot evacuated with manual flushing of Foley by Dr. Erlene Quan. Continue CBI for now -Three way Foley was placed and started CBI in ED -IV Rocephin for UTI -Follow-up urine culture -Flomax   UTI (urinary tract infection) -see above  Urinary retention -see above  Coronary artery disease involving native coronary artery of native heart No chest pain -Hold Plavix due to hematuria -will continue ASA 81 mg daily -Continue Lipitor  Essential hypertension - IV hydralazine as needed -Hold Cozaar due to worsening renal function  Chronic diastolic CHF (congestive heart failure) ([place]) 2D echo on 05/15/2021 showed EF of 55% with grade 1 diastolic dysfunction.  Patient has trace leg edema, no shortness of breath.  Does not seem to have CHF exacerbation. -Check BNP  Acute renal failure superimposed on stage 3b chronic kidney disease ([place]) Likely due to urinary retention and UTI -Follow-up renal function by BMP -Hold cozarr  Normocytic anemia Hemoglobin 9.8 today (9.7 on 02/02/2022) --> 7.8  --> 7.4--1 unit PRBC --8.5 -Follow-up CBC every 6 hours -will transfuse blood if Hgb is < 7.0 --pt developed acute blood loss on chronic anemia due to gross hematuria  H/O prostate cancer S/p of XRT.  -f/u with urology and oncology-- patient currently not undergoing any active treatment       Subjective: patient reports feeling a lot better today.  Physical Exam: Vitals:   03/15/22 0700 03/15/22 0837 03/15/22 0900 03/15/22 0930  BP: 132/67  (!) 134/59 (!) 144/76  Pulse: (!) 52  66 60  Resp: '17  16 13  '$ Temp:  (!) 97.4 F (36.3 C)    TempSrc:  Oral    SpO2: 99%  99% 93%  Weight:      Height:       General: Not in acute distress Cardiac: S1/S2, RRR, No murmurs, No gallops or rubs. Respiratory: No rales, wheezing, rhonchi or rubs. GI: Soft, nondistended, nontender, no rebound pain, no organomegaly, BS present. FOLEY + GU: has gross hematuria Ext: has trace leg edema bilaterally. 1+DP/PT pulse bilaterally. Skin: No rashes.  Neuro: Alert, oriented X 3 non focal Family Communication: none today  Disposition: Status is: Inpatient Remains inpatient appropriate because: getting CBI for gross hematuria  Planned Discharge Destination: Home    Time spent: 35 minutes  Author: Fritzi Mandes, MD 03/15/2022 11:50 AM  For on call review [email or web address].

## 2022-03-16 DIAGNOSIS — R310 Gross hematuria: Secondary | ICD-10-CM | POA: Diagnosis not present

## 2022-03-16 DIAGNOSIS — R339 Retention of urine, unspecified: Secondary | ICD-10-CM | POA: Diagnosis not present

## 2022-03-16 DIAGNOSIS — Z8679 Personal history of other diseases of the circulatory system: Secondary | ICD-10-CM | POA: Diagnosis not present

## 2022-03-16 DIAGNOSIS — C61 Malignant neoplasm of prostate: Secondary | ICD-10-CM | POA: Diagnosis not present

## 2022-03-16 LAB — TYPE AND SCREEN
ABO/RH(D): A POS
Antibody Screen: NEGATIVE
Unit division: 0

## 2022-03-16 LAB — BPAM RBC
Blood Product Expiration Date: 202308232359
ISSUE DATE / TIME: 202308130015
Unit Type and Rh: 6200

## 2022-03-16 LAB — HEMOGLOBIN: Hemoglobin: 9.1 g/dL — ABNORMAL LOW (ref 13.0–17.0)

## 2022-03-16 LAB — URINE CULTURE: Culture: 20000 — AB

## 2022-03-16 MED ORDER — AMOXICILLIN 500 MG PO CAPS: 500.0000 mg | ORAL_CAPSULE | Freq: Three times a day (TID) | ORAL | 0 refills | Status: AC

## 2022-03-16 MED ORDER — CHLORHEXIDINE GLUCONATE CLOTH 2 % EX PADS
6.0000 | MEDICATED_PAD | Freq: Every day | CUTANEOUS | Status: DC
  Administered 2022-03-16: 6 via TOPICAL

## 2022-03-16 MED ORDER — AMOXICILLIN 500 MG PO CAPS
500.0000 mg | ORAL_CAPSULE | Freq: Three times a day (TID) | ORAL | Status: DC
  Filled 2022-03-16: qty 1

## 2022-03-16 MED ORDER — SODIUM CHLORIDE 0.9 % IV SOLN
2.0000 g | Freq: Three times a day (TID) | INTRAVENOUS | Status: DC
  Administered 2022-03-16: 2 g via INTRAVENOUS
  Filled 2022-03-16 (×2): qty 2000

## 2022-03-16 NOTE — Discharge Summary (Signed)
Physician Discharge Summary   Patient: Cesar Powell MRN: [id number] DOB: 26-Apr-1931  Admit date:     03/14/2022  Discharge date: 03/16/22  Discharge Physician: Fritzi Mandes   PCP: Owens Loffler, MD   Recommendations at discharge:    F/u dr sninsky on your appt Keep your upcoming appt with dr Janese Banks  Discharge Diagnoses: Principal Problem:   Gross hematuria Active Problems:   UTI (urinary tract infection)   Urinary retention   Essential hypertension   Coronary artery disease involving native coronary artery of native heart   Chronic diastolic CHF (congestive heart failure) ([place])   Acute renal failure superimposed on stage 3b chronic kidney disease ([place])   Prostate cancer ([place])   Normocytic anemia   H/O prostate cancer   Sleep apnea  \ Hospital Course: * Gross hematuria [place] UTI Gross hematuria, urinary retention and UTI: Patient seems to have hemorrhagic cystitis with h/o radiation and chronic bladder wall thickening as noted on Imaging studies -- Urinalysis positive for UTI.   --Appreciated Consult with Dr. Erlene Quan of urology-- 200 mL of blood clot evacuated with manual flushing of Foley by Dr. Erlene Quan. Continue CBI for now -Three way Foley was placed and started CBI in ED -IV Rocephin for UTI--change to po Ampicillin with UC positive for E feacium -Flomax --foly removed today able to void. Pink tinge urine. No PVR on bladder scan --pt to f/u urology as out pt. D/w Urology PA--ok to dc today     UTI (urinary tract infection) -see above   Urinary retention -see above   Coronary artery disease involving native coronary artery of native heart No chest pain -Hold Plavix due to hematuria -will continue ASA 81 mg daily -Continue Lipitor   Essential hypertension - IV hydralazine as needed -resumed Cozaar now that renal function is ok   Chronic diastolic CHF (congestive heart failure) ([place]) 2D echo on 05/15/2021 showed EF of 55% with grade 1 diastolic  dysfunction.  Patient has trace leg edema, no shortness of breath.  Does not seem to have CHF exacerbation.   Acute renal failure superimposed on stage 3b chronic kidney disease ([place]) Likely due to urinary retention and UTI -creat at baseline   Normocytic anemia Hemoglobin 9.8 today (9.7 on 02/02/2022) --> 7.8 --> 7.4--1 unit PRBC --8.5--9.1 -will transfuse blood if Hgb is < 7.0 --pt developed acute blood loss on chronic anemia due to gross hematuria   H/O prostate cancer S/p of XRT.  -f/u with urology and oncology-- patient currently not undergoing any active treatment --ADT recommended by urology--pt to f/u as out pt  D/C home.pt agreeable              Consultants: urology Procedures performed: CBI  Disposition: Home Diet recommendation:  Discharge Diet Orders (From admission, onward)     Start     Ordered   03/16/22 0000  Diet - low sodium heart healthy        03/16/22 1535           Cardiac diet DISCHARGE MEDICATION: Allergies as of 03/16/2022   No Known Allergies      Medication List     STOP taking these medications    clopidogrel 75 MG tablet Commonly known as: Plavix   pantoprazole 40 MG tablet Commonly known as: Protonix       TAKE these medications    amoxicillin 500 MG capsule Commonly known as: AMOXIL Take 1 capsule (500 mg total) by mouth every 8 (eight) hours for 13  doses. What changed: when to take this   aspirin 81 MG chewable tablet Chew 1 tablet (81 mg total) by mouth daily.   atorvastatin 80 MG tablet Commonly known as: LIPITOR Take 1 tablet (80 mg total) by mouth daily.   losartan 25 MG tablet Commonly known as: COZAAR Take 1 tablet (25 mg total) by mouth daily.   nitroGLYCERIN 0.4 MG SL tablet Commonly known as: NITROSTAT Place 0.4 mg under the tongue every 5 (five) minutes as needed for chest pain.   tamsulosin 0.4 MG Caps capsule Commonly known as: FLOMAX Take 0.4 mg by mouth daily after supper.         Follow-up Information     Copland, Spencer, MD. Schedule an appointment as soon as possible for a visit in 1 week(s).   Specialties: Family Medicine, Sports Medicine Contact information: [place] [address] [phone]         Minna Merritts, MD .   Specialty: Cardiology Contact information: [place] [address] [phone]         Billey Co, MD Follow up.   Specialty: Urology Why: for Hematuria f/u Contact information: [place] [address] [phone]         Sindy Guadeloupe, MD. Go to.   Specialty: Oncology Why: on your scheduled appt Contact information: [place] [address] [phone]                Discharge Exam: Danley Danker Weights   03/13/22 2210  Weight: 87.2 kg     Condition at discharge: fair  The results of significant diagnostics from this hospitalization (including imaging, microbiology, ancillary and laboratory) are listed below for reference.   Imaging Studies: US PELVIS LIMITED (TRANSABDOMINAL ONLY)  Result Date: 03/14/2022 CLINICAL DATA:  Hematuria. EXAM: LIMITED ULTRASOUND OF PELVIS TECHNIQUE: Limited transabdominal ultrasound examination of the pelvis was performed. COMPARISON:  None Available. FINDINGS: A masslike region in the bladder is likely a large clot given history. The bladder is distended with a volume of 476 cc. No other abnormalities. IMPRESSION: The rounded masslike region in the bladder is probably a clot given history. Recommend follow-up after resolution of symptoms to ensure resolution. The bladder is distended with a volume of 476 cc. Electronically Signed   By: Dorise Bullion III M.D.   On: 03/14/2022 12:44   CT Renal Stone Study  Result Date: 02/20/2022 CLINICAL DATA:  Flank pain, kidney stones suspected, hematuria, history of recurrent metastatic prostate cancer * Tracking Code: BO * EXAM: CT ABDOMEN AND  PELVIS WITHOUT CONTRAST TECHNIQUE: Multidetector CT imaging of the abdomen and pelvis was performed following the standard protocol without IV contrast. RADIATION DOSE REDUCTION: This exam was performed according to the departmental dose-optimization program which includes automated exposure control, adjustment of the mA and/or kV according to patient size and/or use of iterative reconstruction technique. COMPARISON:  PET-CT, 11/25/2021, CT chest abdomen pelvis, 04/02/2021 FINDINGS: Lower chest: No acute abnormality.  Coronary artery calcifications. Hepatobiliary: No solid liver abnormality is seen. No gallstones, gallbladder wall thickening, or biliary dilatation. Pancreas: Unremarkable. No pancreatic ductal dilatation or surrounding inflammatory changes. Spleen: Normal in size without significant abnormality. Adrenals/Urinary Tract: Adrenal glands are unremarkable. Simple, benign bilateral renal cortical cysts, for which no further follow-up or characterization is required kidneys are otherwise normal, without renal calculi, solid lesion, or hydronephrosis. Diffusely thickened urinary bladder with adjacent fat stranding and heterogeneously hyperdense material dependently (  series 2, image 64). Stomach/Bowel: Stomach is within normal limits. Appendix appears normal. No evidence of bowel wall thickening, distention, or inflammatory changes. Sigmoid diverticula. Vascular/Lymphatic: Aortic atherosclerosis. Unchanged retroperitoneal lymphadenopathy, index left retroperitoneal node measuring 2.3 x 1.3 cm (series 2, image 32). Reproductive: Prostatomegaly. Probable TURP defect. Unchanged heterogeneous dystrophic calcification within the right aspect of the prostate (series 2, image 76). Other: Small umbilical hernia containing fat and a single, nonobstructed loop of mid small bowel (series 2, image 60). No ascites. Musculoskeletal: New sclerotic wedge deformity of L1 vertebral body (series 6, image 90) as well as  sclerotic inferior endplate deformity of the T12 vertebral body (series 6, image 98). Unchanged inferior endplate deformity of L3 and superior endplate deformity of L4 (series 6, image 92). IMPRESSION: 1. No urinary tract calculus or hydronephrosis. 2. Diffusely thickened urinary bladder with adjacent fat stranding and heterogeneously hyperdense material dependently, consistent with nonspecific infectious or inflammatory cystitis and most likely blood product or clot in the bladder lumen. Correlate with urinalysis. Radiation induced cystitis is a differential consideration. 3. Prostatomegaly with probable TURP defect. Unchanged heterogeneous dystrophic calcification within the right aspect of the prostate. 4. Unchanged retroperitoneal lymphadenopathy. 5. New sclerotic wedge deformity of L1 vertebral body as well as sclerotic inferior endplate deformity of the T12 vertebral body. Although nonspecific, this is worrisome for a pathologic fracture in the setting of known metastatic prostate malignancy. 6. Small umbilical hernia containing fat and a single, nonobstructed loop of mid small bowel. 7. Coronary artery disease. Aortic Atherosclerosis (ICD10-I70.0). Electronically Signed   By: Delanna Ahmadi M.D.   On: 02/20/2022 12:28    Microbiology: Results for orders placed or performed during the hospital encounter of 03/14/22  Urine Culture     Status: Abnormal   Collection Time: 03/14/22  3:51 AM   Specimen: In/Out Cath Urine  Result Value Ref Range Status   Specimen Description   Final    IN/OUT CATH URINE Performed at [facility], [address] [place], [place] [address]    Special Requests   Final    NONE Performed at [facility], [address] [place], [place] [address]    Culture 20,000 COLONIES/mL ENTEROCOCCUS FAECALIS (A)  Final   Report Status 03/16/2022 FINAL  Final   Organism ID, Bacteria ENTEROCOCCUS FAECALIS (A)  Final      Susceptibility   Enterococcus faecalis -  MIC*    AMPICILLIN <=2 SENSITIVE Sensitive     NITROFURANTOIN <=16 SENSITIVE Sensitive     VANCOMYCIN 1 SENSITIVE Sensitive     * 20,000 COLONIES/mL ENTEROCOCCUS FAECALIS    Labs: CBC: Recent Labs  Lab 03/13/22 2213 03/14/22 0756 03/14/22 1551 03/14/22 2329 03/15/22 0322 03/15/22 0857 03/15/22 1824 03/15/22 2205 03/16/22 1015  WBC 8.6 6.6 8.5 6.0  --  5.0  --   --   --   NEUTROABS 7.0  --   --   --   --   --   --   --   --   HGB 9.8* 7.8* 8.6* 7.4* 8.4* 8.5* 8.7* 8.6* 9.1*  HCT 31.5* 24.7* 28.4* 23.6* 26.9* 26.3*  --   --   --   MCV 92.6 90.5 95.9 92.5  --  91.0  --   --   --   PLT 316 237 250 247  --  238  --   --   --    Basic Metabolic Panel: Recent Labs  Lab 03/13/22 2213 03/15/22 0322  NA 133* 137  K 4.5 3.7  CL 101 112*  CO2 22 19*  GLUCOSE 133* 99  BUN 27* 22  CREATININE 1.51* 1.23  CALCIUM 9.1 7.6*   Liver Function Tests: Recent Labs  Lab 03/13/22 2213  AST 22  ALT 14  ALKPHOS 61  BILITOT 0.5  PROT 8.0  ALBUMIN 3.9   CBG: No results for input(s): "GLUCAP" in the last 168 hours.  Discharge time spent: greater than 30 minutes.  Signed: Fritzi Mandes, MD [facility] 03/16/2022

## 2022-03-16 NOTE — TOC Initial Note (Signed)
Transition of Care [facility]) - Initial/Assessment Note    Patient Details  Name: Cesar Powell MRN: [id number] Date of Birth: [birth year]  Transition of Care [facility]) CM/SW Contact:    Beverly Sessions, RN Phone Number: 03/16/2022, 1:21 PM  Clinical Narrative:                  Assessed by this TOC 7/23 see note below "Admitted for: hematuria  Admitted from: home with wife JHE:[id number] [facility] Current home health/prior home health/DME: RW and cane   Patient has been open with [facility] home health in the past.  States he does not feel home health is indicated this admission  Son to transport at discharge "   Per MD no TOC needs at discharge        Patient Goals and CMS Choice        Expected Discharge Plan and Services                                                Prior Living Arrangements/Services                       Activities of Daily Living Home Assistive Devices/Equipment: None ADL Screening (condition at time of admission) Patient's cognitive ability adequate to safely complete daily activities?: Yes Is the patient deaf or have difficulty hearing?: No Does the patient have difficulty seeing, even when wearing glasses/contacts?: No Does the patient have difficulty concentrating, remembering, or making decisions?: No Patient able to express need for assistance with ADLs?: Yes Does the patient have difficulty dressing or bathing?: No Independently performs ADLs?: Yes (appropriate for developmental age) Does the patient have difficulty walking or climbing stairs?: No Weakness of Legs: None Weakness of Arms/Hands: None  Permission Sought/Granted                  Emotional Assessment              Admission diagnosis:  Hemorrhagic cystitis [N30.91] Gross hematuria [R31.0] Acute urinary retention [R33.8] Hematuria [R31.9] Patient Active Problem List   Diagnosis Date Noted   Hematuria 03/14/2022   H/O prostate cancer     Chronic diastolic CHF (congestive heart failure) ([place]) 02/21/2022   Gross hematuria 02/20/2022   UTI (urinary tract infection) 01/06/2022   Generalized weakness 01/06/2022   Anemia of chronic disease 12/12/2021   Acute renal failure superimposed on stage 3b chronic kidney disease ([place]) 12/12/2021   Coronary artery disease involving native coronary artery of native heart 08/25/2021   STEMI involving right coronary artery ([place]) 05/13/2021   Normocytic anemia    Urinary retention    Goals of care, counseling/discussion 08/08/2019   Prostate cancer ([place]) 08/08/2019   Essential hypertension 81/85/6314   METABOLIC SYNDROME X 97/09/6376   Local recurrence of prostate cancer ([place]) 06/22/2007   DIVERTICULITIS, HX OF 06/22/2007   Sleep apnea 1977   PCP:  Owens Loffler, MD [id number] [id number]: [phone]: [phone]    Social Determinants of Health (SDOH) Interventions    Readmission Risk Interventions    03/16/2022    1:20 PM 02/23/2022    1:03 PM 02/22/2022    2:21 PM  Readmission Risk Prevention Plan  Transportation Screening Complete Complete Complete  HRI  or Home Care Consult   Patient refused  Social Work Consult for [place] Planning/Counseling Complete  Complete  Palliative Care Screening Not Applicable  Not Applicable  Medication Review (RN Care Manager) Complete Complete Complete  PCP or Specialist appointment within 3-5 days of discharge  Complete   HRI or [place]  Not Complete   [place]  Not Applicable

## 2022-03-16 NOTE — Progress Notes (Signed)
I returned to the bedside at midday today. Efflux remains clear yellow off CBI. OK to discontinue CBI at this point, orders placed.  Antibiotics have been switched to ampicillin per urine culture results.  We discussed discharge this afternoon with voiding trial prior versus outpatient voiding trial later this week with the option for Foley downsizing today. He elected for voiding trial. I placed orders for this. If follow-up PVR is appropriate, ok to discharge without a catheter this afternoon. We discussed the possibility of going into retention after he returns home, and he is willing to perform CIC if needed once there.

## 2022-03-16 NOTE — Progress Notes (Signed)
Urology Inpatient Progress Note  Subjective: No acute events overnight.  He is afebrile, VSS. Hemoglobin stable today, 8.6.  Urine culture growing low colony counts of E faecalis.  Blood cultures finalized with no growth.  On antibiotics as below. CBI stopped overnight due to clear efflux.  There is accumulation of pink efflux at the proximal tubing this morning and urinary sediment versus cellular debris along the length of the tubing.  Patient denies pain.  Anti-infectives: Anti-infectives (From admission, onward)    Start     Dose/Rate Route Frequency Ordered Stop   03/14/22 0730  cefTRIAXone (ROCEPHIN) 1 g in sodium chloride 0.9 % 100 mL IVPB        1 g 200 mL/hr over 30 Minutes Intravenous Every 24 hours 03/14/22 0720         Current Facility-Administered Medications  Medication Dose Route Frequency Provider Last Rate Last Admin   acetaminophen (TYLENOL) tablet 650 mg  650 mg Oral Q6H PRN Ivor Costa, MD   650 mg at 03/14/22 1205   aspirin chewable tablet 81 mg  81 mg Oral Daily Ivor Costa, MD   81 mg at 03/16/22 0810   atorvastatin (LIPITOR) tablet 80 mg  80 mg Oral Daily Ivor Costa, MD   80 mg at 03/16/22 0809   cefTRIAXone (ROCEPHIN) 1 g in sodium chloride 0.9 % 100 mL IVPB  1 g Intravenous Q24H Ivor Costa, MD 200 mL/hr at 03/16/22 0810 1 g at 03/16/22 0810   Chlorhexidine Gluconate Cloth 2 % PADS 6 each  6 each Topical Daily Ivor Costa, MD   6 each at 03/16/22 0810   hydrALAZINE (APRESOLINE) injection 5 mg  5 mg Intravenous Q2H PRN Ivor Costa, MD   5 mg at 03/14/22 1238   morphine (PF) 2 MG/ML injection 1 mg  1 mg Intravenous Q3H PRN Ivor Costa, MD   1 mg at 03/14/22 1008   nitroGLYCERIN (NITROSTAT) SL tablet 0.4 mg  0.4 mg Sublingual Q5 min PRN Ivor Costa, MD       ondansetron [facility]) injection 4 mg  4 mg Intravenous Q8H PRN Ivor Costa, MD       sodium chloride irrigation 0.9 % 3,000 mL  3,000 mL Irrigation Continuous Ivor Costa, MD 600 mL/hr at 03/15/22 0323 3,000 mL at  03/15/22 1553   tamsulosin (FLOMAX) capsule 0.4 mg  0.4 mg Oral QPC supper Ivor Costa, MD   0.4 mg at 03/15/22 1714   Objective: Vital signs in last 24 hours: Temp:  [97.2 F (36.2 C)-98 F (36.7 C)] 97.8 F (36.6 C) (08/14 0818) Pulse Rate:  [60-72] 62 (08/14 0818) Resp:  [13-19] 18 (08/14 0818) BP: (134-157)/(57-78) 137/57 (08/14 0818) SpO2:  [83 %-100 %] 99 % (08/14 0818)  Intake/Output from previous day: 08/13 0701 - 08/14 0700 In: 2500  Out: 5700 [Urine:5700] Intake/Output this shift: Total I/O In: -  Out: 1000 [Urine:1000]  Physical Exam Vitals and nursing note reviewed.  Constitutional:      General: He is not in acute distress.    Appearance: He is not ill-appearing, toxic-appearing or diaphoretic.  HENT:     Head: Normocephalic and atraumatic.  Pulmonary:     Effort: Pulmonary effort is normal. No respiratory distress.  Skin:    General: Skin is warm and dry.  Neurological:     Mental Status: He is alert and oriented to person, place, and time.  Psychiatric:        Behavior: Behavior normal.    Lab Results:  Recent Labs    03/14/22 2329 03/15/22 0322 03/15/22 0857 03/15/22 1824 03/15/22 2205  WBC 6.0  --  5.0  --   --   HGB 7.4* 8.4* 8.5* 8.7* 8.6*  HCT 23.6* 26.9* 26.3*  --   --   PLT 247  --  238  --   --    BMET Recent Labs    03/13/22 2213 03/15/22 0322  NA 133* 137  K 4.5 3.7  CL 101 112*  CO2 22 19*  GLUCOSE 133* 99  BUN 27* 22  CREATININE 1.51* 1.23  CALCIUM 9.1 7.6*   PT/INR Recent Labs    03/14/22 0756  LABPROT 15.4*  INR 1.2   Assessment & Plan: 88 year old male with metastatic prostate cancer who recently discontinued ADT admitted with gross hematuria on CBI.  He is s/p 1 unit PRBCs.  Blood counts are stable and gross hematuria appears to be clearing.  I irrigated his Foley catheter at the bedside this morning with 240 cc of sterile water.  Efflux cleared from pink to pink-tinged and no clot material was cleared from the  bladder.  We will keep CBI off this morning and reassess at midday.  Okay to discontinue CBI if efflux remains light pink or lighter.  May consider voiding trial at that point versus tomorrow morning versus Foley catheter downsizing.  Recommend reconsidering his antibiotic coverage given growth of E faecalis on admission culture, as I do not suspect Rocephin will cover this.  We will arrange outpatient follow-up with Dr. Diamantina Providence to discuss possible resumption of ADT.  Debroah Loop, PA-C 03/16/2022

## 2022-03-16 NOTE — Progress Notes (Signed)
Mobility Specialist - Progress Note    03/16/22 1019  Mobility  Activity Ambulated with assistance in room;Stood at bedside;Dangled on edge of bed  Level of Assistance Standby assist, set-up cues, supervision of patient - no hands on  Assistive Device Other (Comment) (SPC)  Distance Ambulated (ft) 30 ft  Activity Response Tolerated well  $Mobility charge 1 Mobility   Pt supine in bed on RA upon arrival. Pt sts and ambulates in room SBA with time. Pt returns to bed with needs in reach and bed alarm on.   Gretchen Short  Mobility Specialist  03/16/22 10:31 AM

## 2022-03-16 NOTE — Discharge Instructions (Signed)
YOur plavis is on HOLD--d/w Urology on f/u as to when it can be resumed Take your ASA for now

## 2022-03-17 ENCOUNTER — Encounter: Payer: Self-pay | Admitting: *Deleted

## 2022-03-17 ENCOUNTER — Other Ambulatory Visit: Payer: Self-pay | Admitting: *Deleted

## 2022-03-17 ENCOUNTER — Telehealth: Payer: Self-pay | Admitting: *Deleted

## 2022-03-17 NOTE — Patient Outreach (Signed)
  Care Coordination [facility] Note Transition Care Management Follow-up Telephone Call Date of discharge and from where: 03/16/22 [facility] How have you been since you were released from the hospital? "Better" Any questions or concerns? Yes Would like a Male PureWick External Catheter to use at home at night. Will collaborate with [name], RN Care Coordinator. Advised that he may need to see urologist first to have need documented and sent in with order.  Items Reviewed: Did the pt receive and understand the discharge instructions provided? Yes  Medications obtained and verified? Yes  Other? No  Any new allergies since your discharge? No  Dietary orders reviewed? Yes Do you have support at home? Yes   Home Care and Equipment/Supplies: Were home health services ordered? no If so, what is the name of the agency? N/A  Has the agency set up a time to come to the patient's home? not applicable Were any new equipment or medical supplies ordered?  No What is the name of the medical supply agency? N/a Were you able to get the supplies/equipment? not applicable Do you have any questions related to the use of the equipment or supplies? No  Functional Questionnaire: (I = Independent and D = Dependent) ADLs: I  Bathing/Dressing- I  Meal Prep- I  Eating- I  Maintaining continence- I  Transferring/Ambulation- I  Managing Meds- I  Follow up appointments reviewed:  PCP Hospital f/u appt confirmed? No  Recently saw PCP and doesn't think it's necessary. [facility] f/u appt confirmed? Yes  Scheduled to see Dr Diamantina Providence on 03/26/22 @ 3:30. Are transportation arrangements needed? No  If their condition worsens, is the pt aware to call PCP or go to the Emergency Dept.? Yes Was the patient provided with contact information for the PCP's office or ED? Yes Was to pt encouraged to call back with questions or concerns? Yes  SDOH assessments and interventions completed:   Yes  Care  Coordination Interventions Activated:  Yes   Care Coordination Interventions:  Referred for Care Coordination Services:  RN Care Coordinator    Encounter Outcome:  Pt. Visit Completed    Chong Sicilian, BSN, RN-BC RN Care Coordinator Direct Dial: [phone]

## 2022-03-17 NOTE — Patient Outreach (Signed)
  Care Coordination [facility] Note Transition Care Management Unsuccessful Follow-up Telephone Call  Date of discharge and from where:  03/16/22 [facility]  Attempts:  1st Attempt  Reason for unsuccessful TCM follow-up call:  Left voice message.  Emelia Loron RN, BSN [place] [phone] [email or web address]

## 2022-03-19 ENCOUNTER — Ambulatory Visit: Payer: Self-pay

## 2022-03-19 NOTE — Patient Instructions (Signed)
Visit Information  Thank you for taking time to visit with me today. Please don't hesitate to contact me if I can be of assistance to you.   Following are the goals we discussed today:   Goals Addressed             This Visit's Progress    Patient Stated:  " i want to obtain the pure wick external catheter"       Care Coordination Interventions: Advised patient to discuss obtaining pure wick external catheter with his urologist at next visit on 03/26/22 Reviewed medications with patient and discussed importance of compliance Reviewed scheduled/upcoming provider appointments           If you are experiencing a Mental Health or [place] or need someone to talk to, please call the Suicide and Crisis Lifeline: 988 call 1-800-273-TALK (toll free, 24 hour hotline)  The patient verbalized understanding of instructions, educational materials, and care plan provided today and DECLINED offer to receive copy of patient instructions, educational materials, and care plan.   No further follow up required:    [name] [facility] Coordinator [phone]

## 2022-03-19 NOTE — Patient Outreach (Signed)
  Care Coordination   Initial Visit Note   03/19/2022 Name: Cesar Powell MRN: [id number] DOB: 1931/06/10  Cesar Powell is a 88 y.o. year old male who sees Copland, Frederico Hamman, MD for primary care. I spoke with  Cesar Powell by phone today  What matters to the patients health and wellness today?  Patient would like to obtain a pure wick external catheter    Goals Addressed             This Visit's Progress    Patient Stated:  " i want to obtain the pure wick external catheter"       Care Coordination Interventions: Advised patient to discuss obtaining pure wick external catheter with his urologist at next visit on 03/26/22 Reviewed medications with patient and discussed importance of compliance Reviewed scheduled/upcoming provider appointments           SDOH assessments and interventions completed:  Yes  SDOH Interventions Today    Flowsheet Row Most Recent Value  SDOH Interventions   Food Insecurity Interventions Intervention Not Indicated  Housing Interventions Intervention Not Indicated  Transportation Interventions Intervention Not Indicated        Care Coordination Interventions Activated:  Yes  Care Coordination Interventions:  Yes, provided   Follow up plan: No further intervention required.   Encounter Outcome:  Pt. Visit Completed   [name] RN,BSN,CCM [place] Coordinator [phone]

## 2022-03-24 DIAGNOSIS — M5136 Other intervertebral disc degeneration, lumbar region: Secondary | ICD-10-CM | POA: Diagnosis not present

## 2022-03-24 DIAGNOSIS — M9905 Segmental and somatic dysfunction of pelvic region: Secondary | ICD-10-CM | POA: Diagnosis not present

## 2022-03-24 DIAGNOSIS — M62830 Muscle spasm of back: Secondary | ICD-10-CM | POA: Diagnosis not present

## 2022-03-24 DIAGNOSIS — M9903 Segmental and somatic dysfunction of lumbar region: Secondary | ICD-10-CM | POA: Diagnosis not present

## 2022-03-26 ENCOUNTER — Ambulatory Visit: Payer: Medicare Other | Admitting: Physician Assistant

## 2022-03-26 ENCOUNTER — Encounter: Payer: Self-pay | Admitting: Urology

## 2022-03-26 ENCOUNTER — Ambulatory Visit (INDEPENDENT_AMBULATORY_CARE_PROVIDER_SITE_OTHER): Payer: Medicare Other | Admitting: Urology

## 2022-03-26 VITALS — BP 130/67 | HR 120

## 2022-03-26 DIAGNOSIS — R310 Gross hematuria: Secondary | ICD-10-CM

## 2022-03-26 DIAGNOSIS — C61 Malignant neoplasm of prostate: Secondary | ICD-10-CM | POA: Diagnosis not present

## 2022-03-26 NOTE — Progress Notes (Signed)
03/26/2022 4:36 PM   SHOLOM DULUDE 06/29/1931 [id number]  Reason for visit: Follow up metastatic prostate cancer, history of urinary retention, incontinence, recent admission for gross hematuria  HPI: Complex 88 year old male with metastatic prostate cancer who has deferred ADT. Originally treated with radiation in 2005, and was on lupron for biochemical recurrence prior to discontinuing secondary to side effects. He has a history of cystolitholopaxy by Dr Pilar Jarvis in 2018. He also underwent brachytherapy and XRT to abdominal nodes in 2021 with Dr Baruch Gouty.  He also remains on dual anticoagulation after an MI in October 2022.  Was hospitalized mid May 2023 after a fall and also noted to have anemia requiring blood transfusion.  He was treated for a possible UTI at that time, but no urine culture was sent.  In the fall, he developed recurrent urinary retention that was managed by multiple on-call urology providers, and unclear if this was secondary to urethral stricture, bladder neck contracture, prostate obstruction, or atonic bladder.  Cystoscopy with me in November 2022 showed slightly shaggy appearance of the prostatic urethra consistent with prior radiation and possible local recurrence of prostate cancer, but no evidence of stricture and the cystoscope passed easily into the bladder without any resistance.  He was originally on intermittent catheterization and doing well, but over the last 6 months has been floridly incontinent leaking urine with low PVRs.  He is not even voiding spontaneously, just leaking into her depends.  He is primarily bothered by the leaking overnight.  We have tried numerous things including Cunningham clamp, condom catheter, and other collection devices without significant improvement.     His prostate cancer is currently managed with oncology, and it looks like he was temporarily on Orgovyx, the oral ADT, however he discontinued that medication recently as he felt like  it was making him fall asleep while driving.  We again discussed the risk and benefits of ADT, and I do think this would improve his recurrent hematuria likely from local recurrence of prostate cancer, but he deferred secondary to side effects of severe fatigue.  He was recently admitted on 03/14/2022 for gross hematuria and retention of unclear etiology.  He was on CBI temporarily, and urine ultimately cleared.  He is now off all anticoagulation.  He had return into being incontinent, but actually catheterize last night with return of yellow urine.  Denies any hematuria over the last week.  I had another frank conversation with the patient that he has had a very complex history with varying urinary retention and incontinence, likely with combination of local recurrence of prostate cancer combined with long-term incomplete bladder emptying, brachytherapy, and radiation.  I am not sure that we have a good solution for his incontinence or hematuria at this point.  He would like to try a pure wick at home, and we are happy to try to arrange that, but can be difficult with insurance.  He is also interested in cystoscopy to see if a palliative TURP would be an option to improve some of his hematuria and potentially resume spontaneously voiding.  I think we need to have very realistic expectations moving forward.  A referral was also placed to palliative care.    -Continue follow-up with oncology for metastatic prostate cancer, currently off ADT -Continue to manage bladder with his baseline incontinence, would avoid catheters unless true retention, unfortunately I am not sure we have any good options for his incontinence in the setting of his complex history with likely component of  atonic bladder, prostate cancer, and radiation -I again encouraged him to meet with palliative care as his health continues to decline -We will try to arrange pure wick device for home -RTC 2-3 weeks cystoscopy   Billey Co,  [place] [address] [place], [place] [address] [phone]

## 2022-03-26 NOTE — Patient Instructions (Signed)

## 2022-03-27 ENCOUNTER — Telehealth: Payer: Self-pay | Admitting: Student

## 2022-03-27 NOTE — Telephone Encounter (Signed)
Attempted to contact patient/wife and daughter [name], to schedule Palliative Consult, no answer left VM at both numbers requesting a return call to schedule visit.

## 2022-03-31 ENCOUNTER — Telehealth: Payer: Self-pay | Admitting: Student

## 2022-03-31 NOTE — Telephone Encounter (Signed)
Spoke with patient regarding the Palliative referral and after explaining to him what this was he has declined services at this time.  Instructed patient that if he changed his mind in the future to please contact his MD.  Instructed patient that I would cancel the referral and notify the referring MD and he was in agreement with this.

## 2022-04-02 ENCOUNTER — Other Ambulatory Visit (HOSPITAL_COMMUNITY): Payer: Self-pay

## 2022-04-03 ENCOUNTER — Inpatient Hospital Stay (HOSPITAL_BASED_OUTPATIENT_CLINIC_OR_DEPARTMENT_OTHER): Payer: Medicare Other | Admitting: Oncology

## 2022-04-03 ENCOUNTER — Encounter: Payer: Self-pay | Admitting: Oncology

## 2022-04-03 ENCOUNTER — Inpatient Hospital Stay: Payer: Medicare Other | Attending: Oncology

## 2022-04-03 VITALS — BP 144/53 | HR 54 | Temp 96.5°F | Resp 20 | Wt 193.6 lb

## 2022-04-03 DIAGNOSIS — D649 Anemia, unspecified: Secondary | ICD-10-CM

## 2022-04-03 DIAGNOSIS — Z87891 Personal history of nicotine dependence: Secondary | ICD-10-CM | POA: Diagnosis not present

## 2022-04-03 DIAGNOSIS — C771 Secondary and unspecified malignant neoplasm of intrathoracic lymph nodes: Secondary | ICD-10-CM | POA: Diagnosis not present

## 2022-04-03 DIAGNOSIS — R319 Hematuria, unspecified: Secondary | ICD-10-CM | POA: Insufficient documentation

## 2022-04-03 DIAGNOSIS — C61 Malignant neoplasm of prostate: Secondary | ICD-10-CM | POA: Insufficient documentation

## 2022-04-03 LAB — COMPREHENSIVE METABOLIC PANEL
ALT: 14 U/L (ref 0–44)
AST: 21 U/L (ref 15–41)
Albumin: 3.3 g/dL — ABNORMAL LOW (ref 3.5–5.0)
Alkaline Phosphatase: 63 U/L (ref 38–126)
Anion gap: 10 (ref 5–15)
BUN: 31 mg/dL — ABNORMAL HIGH (ref 8–23)
CO2: 23 mmol/L (ref 22–32)
Calcium: 8.4 mg/dL — ABNORMAL LOW (ref 8.9–10.3)
Chloride: 101 mmol/L (ref 98–111)
Creatinine, Ser: 1.5 mg/dL — ABNORMAL HIGH (ref 0.61–1.24)
GFR, Estimated: 44 mL/min — ABNORMAL LOW (ref >60)
Glucose, Bld: 109 mg/dL — ABNORMAL HIGH (ref 70–99)
Potassium: 4.2 mmol/L (ref 3.5–5.1)
Sodium: 134 mmol/L — ABNORMAL LOW (ref 135–145)
Total Bilirubin: 0.3 mg/dL (ref 0.3–1.2)
Total Protein: 6.9 g/dL (ref 6.5–8.1)

## 2022-04-03 LAB — CBC WITH DIFFERENTIAL/PLATELET
Abs Immature Granulocytes: 0.03 10*3/uL (ref 0.00–0.07)
Basophils Absolute: 0 10*3/uL (ref 0.0–0.1)
Basophils Relative: 0 %
Eosinophils Absolute: 0.2 10*3/uL (ref 0.0–0.5)
Eosinophils Relative: 3 %
HCT: 27.5 % — ABNORMAL LOW (ref 39.0–52.0)
Hemoglobin: 9 g/dL — ABNORMAL LOW (ref 13.0–17.0)
Immature Granulocytes: 1 %
Lymphocytes Relative: 9 %
Lymphs Abs: 0.5 10*3/uL — ABNORMAL LOW (ref 0.7–4.0)
MCH: 30.8 pg (ref 26.0–34.0)
MCHC: 32.7 g/dL (ref 30.0–36.0)
MCV: 94.2 fL (ref 80.0–100.0)
Monocytes Absolute: 0.6 10*3/uL (ref 0.1–1.0)
Monocytes Relative: 12 %
Neutro Abs: 3.7 10*3/uL (ref 1.7–7.7)
Neutrophils Relative %: 75 %
Platelets: 282 10*3/uL (ref 150–400)
RBC: 2.92 MIL/uL — ABNORMAL LOW (ref 4.22–5.81)
RDW: 15.8 % — ABNORMAL HIGH (ref 11.5–15.5)
WBC: 5 10*3/uL (ref 4.0–10.5)
nRBC: 0 % (ref 0.0–0.2)

## 2022-04-03 LAB — IRON AND TIBC
Iron: 53 ug/dL (ref 45–182)
Saturation Ratios: 27 % (ref 17.9–39.5)
TIBC: 200 ug/dL — ABNORMAL LOW (ref 250–450)
UIBC: 147 ug/dL

## 2022-04-03 LAB — FERRITIN: Ferritin: 569 ng/mL — ABNORMAL HIGH (ref 24–336)

## 2022-04-03 LAB — PSA: Prostatic Specific Antigen: 18.06 ng/mL — ABNORMAL HIGH (ref 0.00–4.00)

## 2022-04-03 NOTE — Progress Notes (Signed)
Hematology/Oncology Consult note [facility]  Telephone:([phone] [phone]  Patient Care Team: Owens Loffler, MD as PCP - General Rockey Situ, Kathlene November, MD as PCP - Cardiology (Cardiology) Leandrew Koyanagi, MD as Referring Physician (Ophthalmology) Nickie Retort, MD (Inactive) as Consulting Physician (Urology) Noreene Filbert, MD as Radiation Oncologist (Radiation Oncology)   Name of the patient: Cesar Powell  [id number]  1931-02-26   Date of visit: 04/03/22  Diagnosis-castrate resistant metastatic prostate cancer  Chief complaint/ Reason for visit-routine follow-up of prostate cancer and anemia  Heme/Onc history:  patient is a 88 year old gentleman with no significant past medical problems.  He has a history of prostate cancer that was diagnosed and treated with radiation in 2005.  At that point he his PSA was about 0.03 for a long time.  Then started on Axiron for low testosterone.  PSA eventually went up to as high as 18 2015.  At that point he was started on Lupron his PSA doubling time is around 10 months. Most recently since October 2018 after his PSA went down from 3.6-1.7 and has been gradually increasing to 1.3,1.5 and 2 indicating a PSA doubling time of 9.9 months.     patient  seen by me in September 2019 discussed adding oral antiandrogens like apalutamide versus enzalutamide to Lupron in castrate resistant nonmetastatic prostate cancer.  After discussing risks and benefits patient did not wish to proceed.Patient stopped lupron in feb 2020 but did restart after 1 year in February 2021.  Patient received IMRT for his pelvic lymph nodes.  Patient was started on relugolix in June 2023.  Patient stopped taking it in August 2023 due to problems with sleeping   Patient has had significant anemia with a hemoglobin that has fluctuated between 8-9 since August 2022.  Prior to that his hemoglobin was stable between 11-12.Results of anemia  work-up in the past have been as follows: Ferritin levels elevated in the 400s.  TIBC low at 225 with an iron saturation of 12%.  ANA comprehensive panel negative.  TSH and folate normal.  LDH normal.  Haptoglobin elevated at 427.  B12 levels normal.  Patient had a bone marrow biopsy given his significant anemia which shows slightly hypercellular marrow with myeloid hyperplasia but no evidence of leukemia lymphoma or high-grade dysplasia.  Erythroid precursors were decreased in numbers with occasional atypia.  Granulocytic precursors no overt dysplasia and megakaryocytes quantitatively and qualitatively unremarkable  Interval history-patient states that he would like to manage his prostate cancer with dietary means.  States that he had difficulty sleeping with elagolix and does not wish to go back to it unless his PSA is increasing.  He did have ongoing symptoms of hematuria for which she was hospitalized and also follows up with urology.  ECOG PS- 2 Pain scale- 0 Opioid associated constipation- no  Review of systems- Review of Systems  Constitutional:  Positive for malaise/fatigue. Negative for chills, fever and weight loss.  HENT:  Negative for congestion, ear discharge and nosebleeds.   Eyes:  Negative for blurred vision.  Respiratory:  Negative for cough, hemoptysis, sputum production, shortness of breath and wheezing.   Cardiovascular:  Negative for chest pain, palpitations, orthopnea and claudication.  Gastrointestinal:  Negative for abdominal pain, blood in stool, constipation, diarrhea, heartburn, melena, nausea and vomiting.  Genitourinary:  Positive for hematuria. Negative for dysuria, flank pain, frequency and urgency.  Musculoskeletal:  Negative for back pain, joint pain and myalgias.  Skin:  Negative for rash.  Neurological:  Negative for dizziness, tingling, focal weakness, seizures, weakness and headaches.  Endo/Heme/Allergies:  Does not bruise/bleed easily.   Psychiatric/Behavioral:  Negative for depression and suicidal ideas. The patient does not have insomnia.       No Known Allergies   Past Medical History:  Diagnosis Date   Coronary artery disease involving native coronary artery of native heart 08/25/2021   H/O prostate cancer    was treated with radiation   Hypertension    Local recurrence of prostate cancer ([place]) 06/21/2004   Qualifier: Diagnosis of  By: Fuller Plan CMA (AAMA), [place]     Myocardial infarction ([place]) 2022   Obstructive sleep apnea 06/22/2007   NPSG [place] 1979 Unattended Home sleep Study- 05/10/14- Confirms severe OSA, AHI 41.8/ hr, weight 230 pounds CPAP and also Transcend portable CPAP auto 8-15     Sleep apnea 1977   uses C-pap      Past Surgical History:  Procedure Laterality Date   ABDOMINAL SURGERY     ruptured intestines    CATARACT EXTRACTION W/PHACO Left 01/29/2016   Procedure: CATARACT EXTRACTION PHACO AND INTRAOCULAR LENS PLACEMENT ([place]) left eye;  Surgeon: Leandrew Koyanagi, MD;  Location: [place];  Service: Ophthalmology;  Laterality: Left;  RESTOR LENS   CATARACT EXTRACTION W/PHACO Right 10/21/2016   Procedure: CATARACT EXTRACTION PHACO AND INTRAOCULAR LENS PLACEMENT ([facility])  Right restor toric lens;  Surgeon: Leandrew Koyanagi, MD;  Location: [place];  Service: Ophthalmology;  Laterality: Right;  restor toric lens   CORONARY/GRAFT ACUTE MI REVASCULARIZATION N/A 05/13/2021   Procedure: Coronary/Graft Acute MI Revascularization;  Surgeon: Nelva Bush, MD;  Location: [place] [facility];  Service: Cardiovascular;  Laterality: N/A;   CYSTOSCOPY WITH LITHOLAPAXY N/A 08/14/2016   Procedure: CYSTOSCOPY WITH LITHOLAPAXY;  Surgeon: Nickie Retort, MD;  Location: [facility];  Service: Urology;  Laterality: N/A;   FEMUR IM NAIL Right 10/08/2014   Procedure: INTRAMEDULLARY (IM) NAIL FEMORAL;  Surgeon: Rod Can, MD;  Location: [place];  Service: Orthopedics;  Laterality: Right;   PER DANIELLE 110 MIN   HOLMIUM LASER APPLICATION  4/94/4967   Procedure: HOLMIUM LASER APPLICATION;  Surgeon: Nickie Retort, MD;  Location: [facility];  Service: Urology;;   LEFT HEART CATH AND CORONARY ANGIOGRAPHY N/A 05/13/2021   Procedure: LEFT HEART CATH AND CORONARY ANGIOGRAPHY;  Surgeon: Nelva Bush, MD;  Location: [place] [facility];  Service: Cardiovascular;  Laterality: N/A;   OTHER SURGICAL HISTORY  2006   Prostate Radiation Surgery    RADIOACTIVE SEED IMPLANT N/A 10/30/2019   Procedure: RADIOACTIVE SEED IMPLANT/BRACHYTHERAPY IMPLANT;  Surgeon: Billey Co, MD;  Location: [facility];  Service: Urology;  Laterality: N/A;  43 seeds implanted    Social History   Socioeconomic History   Marital status: Married    Spouse name: Not on file   Number of children: Not on file   Years of education: Not on file   Highest education level: Not on file  Occupational History   Occupation: retired  Tobacco Use   Smoking status: Former    Packs/day: 1.00    Years: 15.00    Total pack years: 15.00    Types: Cigarettes    Quit date: 09/14/1965    Years since quitting: 56.5    Passive exposure: Past   Smokeless tobacco: Never  Vaping Use   Vaping Use: Never used  Substance and Sexual Activity   Alcohol use: Yes    Alcohol/week: 7.0 standard drinks of alcohol  Types: 7 Glasses of wine per week   Drug use: No   Sexual activity: Yes    Birth control/protection: None  Other Topics Concern   Not on file  Social History Narrative   Not on file   Social Determinants of Health   Financial Resource Strain: Low Risk  (12/05/2021)   Overall Financial Resource Strain (CARDIA)    Difficulty of Paying Living Expenses: Not hard at all  Food Insecurity: No Food Insecurity (03/19/2022)   Hunger Vital Sign    Worried About Running Out of Food in the Last Year: Never true    Ran Out of Food in the Last Year: Never true  Transportation Needs: No Transportation Needs (03/19/2022)    PRAPARE - Hydrologist (Medical): No    Lack of Transportation (Non-Medical): No  Physical Activity: Insufficiently Active (12/05/2021)   Exercise Vital Sign    Days of Exercise per Week: 5 days    Minutes of Exercise per Session: 10 min  Stress: No Stress Concern Present (12/05/2021)   [place]    Feeling of Stress : Not at all  Social Connections: Moderately Isolated (12/05/2021)   Social Connection and Isolation Panel [NHANES]    Frequency of Communication with Friends and Family: More than three times a week    Frequency of Social Gatherings with Friends and Family: More than three times a week    Attends Religious Services: Never    Marine scientist or Organizations: No    Attends Archivist Meetings: Never    Marital Status: Married  Human resources officer Violence: Not At Risk (12/05/2021)   Humiliation, Afraid, Rape, and Kick questionnaire    Fear of Current or Ex-Partner: No    Emotionally Abused: No    Physically Abused: No    Sexually Abused: No    Family History  Problem Relation Age of Onset   Heart disease Brother    Heart attack Brother    Lung cancer Brother    Prostate cancer Neg Hx    Bladder Cancer Neg Hx    Kidney cancer Neg Hx      Current Outpatient Medications:    aspirin 81 MG chewable tablet, Chew 1 tablet (81 mg total) by mouth daily., Disp: , Rfl:    atorvastatin (LIPITOR) 80 MG tablet, Take 1 tablet (80 mg total) by mouth daily., Disp: 90 tablet, Rfl: 3   losartan (COZAAR) 25 MG tablet, Take 1 tablet (25 mg total) by mouth daily., Disp: 30 tablet, Rfl: 0   tamsulosin (FLOMAX) 0.4 MG CAPS capsule, Take 0.4 mg by mouth daily after supper., Disp: , Rfl:    nitroGLYCERIN (NITROSTAT) 0.4 MG SL tablet, Place 0.4 mg under the tongue every 5 (five) minutes as needed for chest pain. (Patient not taking: Reported on 04/03/2022), Disp: , Rfl:   Physical exam:   Vitals:   04/03/22 1010  BP: (!) 144/53  Pulse: (!) 54  Resp: 20  Temp: (!) 96.5 F (35.8 C)  SpO2: 100%  Weight: 193 lb 9.6 oz (87.8 kg)   Physical Exam Constitutional:      General: He is not in acute distress. Cardiovascular:     Rate and Rhythm: Normal rate and regular rhythm.     Heart sounds: Normal heart sounds.  Pulmonary:     Effort: Pulmonary effort is normal.     Breath sounds: Normal breath sounds.  Abdominal:  General: Bowel sounds are normal.     Palpations: Abdomen is soft.  Skin:    General: Skin is warm and dry.  Neurological:     Mental Status: He is alert and oriented to person, place, and time.         Latest Ref Rng & Units 04/03/2022    9:38 AM  CMP  Glucose 70 - 99 mg/dL 109   BUN 8 - 23 mg/dL 31   Creatinine 0.61 - 1.24 mg/dL 1.50   Sodium 135 - 145 mmol/L 134   Potassium 3.5 - 5.1 mmol/L 4.2   Chloride 98 - 111 mmol/L 101   CO2 22 - 32 mmol/L 23   Calcium 8.9 - 10.3 mg/dL 8.4   Total Protein 6.5 - 8.1 g/dL 6.9   Total Bilirubin 0.3 - 1.2 mg/dL 0.3   Alkaline Phos 38 - 126 U/L 63   AST 15 - 41 U/L 21   ALT 0 - 44 U/L 14       Latest Ref Rng & Units 04/03/2022    9:38 AM  CBC  WBC 4.0 - 10.5 K/uL 5.0   Hemoglobin 13.0 - 17.0 g/dL 9.0   Hematocrit 39.0 - 52.0 % 27.5   Platelets 150 - 400 K/uL 282     No images are attached to the encounter.  US PELVIS LIMITED (TRANSABDOMINAL ONLY)  Result Date: 03/14/2022 CLINICAL DATA:  Hematuria. EXAM: LIMITED ULTRASOUND OF PELVIS TECHNIQUE: Limited transabdominal ultrasound examination of the pelvis was performed. COMPARISON:  None Available. FINDINGS: A masslike region in the bladder is likely a large clot given history. The bladder is distended with a volume of 476 cc. No other abnormalities. IMPRESSION: The rounded masslike region in the bladder is probably a clot given history. Recommend follow-up after resolution of symptoms to ensure resolution. The bladder is distended with a volume of 476  cc. Electronically Signed   By: Dorise Bullion III M.D.   On: 03/14/2022 12:44     Assessment and plan- Patient is a 88 y.o. male who is here for follow-up of following issues:  Castrate resistant metastaticProstate cancer: Patient found to have metastatic disease mainly in his retrocaval lymph nodes.  He was started on relugolix as he did not wish to do a Lupron or Eligard but he stopped taking that as well.  PSA from today is pending.  Patient is willing to least Ordered only if PSA goes back up.  Hematuria: Etiology unclear and could be secondary to locally advanced prostate cancer versus effects of radiation treatment in the past.  He follows up with Dr. Caprice Beaver and will be undergoing cystoscopy soon.  Normocytic anemia: Etiology unclear and patient is at high extensive work-up including a bone marrow biopsy that was unrevealing.  Presently his hemoglobin is 9 and usually it stays between 8-9.  No need for blood transfusion at this time.  Patient wants to hold off on trying Retacrit until he sees [facility] for second opinion which is coming up in early October 2023.  Labs in 1 month in 2 months and I will see him back in 2 months.  I will obtain CT chest abdomen and pelvis with contrast and bone scan prior   Visit Diagnosis 1. Prostate cancer ([place])   2. Normocytic anemia      Dr. Randa Evens, MD, MPH [facility] at [facility] [phone] 04/03/2022 1:18 PM

## 2022-04-03 NOTE — Progress Notes (Signed)
  Pt states he has noticed small red spots on his arms for the past week. No pain; discomfort; or itchy.

## 2022-04-07 ENCOUNTER — Encounter: Payer: Self-pay | Admitting: *Deleted

## 2022-04-09 ENCOUNTER — Ambulatory Visit: Payer: Medicare Other | Attending: Medical | Admitting: Medical

## 2022-04-09 NOTE — Progress Notes (Deleted)
Cardiology Office Note:    Date:  04/09/2022   ID:  Cesar Powell, DOB [birth year], MRN [id number]  PCP:  Cesar Loffler, MD  Cesar Powell:  Cesar Rogue, MD  Cesar Powell:  None   Referring MD: Cesar Loffler, MD   Chief Complaint: Hospital follow-up  History of Present Illness:    Cesar Powell is a 88 y.o. male with a hx of CAD with inferior STEMI s/p DES RCA in 05/2021, HFpEF, HTN, metastatic prostate cancer, OSA, chronic anemia who is being seen 02/21/2022 for the evaluation of hospital follow-up.     He was admitted 05/2021 for syncope, chest tightness and lightheadedness. EMS was called who found him to have STEMI in the setting of hypotension and bradycardia. He was brought to [facility] where ryhtm was junctional. Emergent CT was unremarkable and he was transferred to the cath lab. Cath showed multivessel CAD. Culprit lesion was thrombotic 95% pRCA stenosis with clot embolization into distal branches. This was successfully treated with primary PCI. He was started on DAPT with ASA and Brilinta for 12 months. Echo showed LVEF 555, no WMA, G1DD, mild MS. BB was held for bradycardia.   The patient presented to the ER 7/21 for hematuria. Last does of ASA and Birlinta was 7/20. Hgb came back at 8. He was given 1 unit of blood. Brilinta was held. Urology was consulted for suspected hematuria/UTI. He was started on abx and a foley was placed. Plan to resume Plavix.   Today,   Past Medical History:  Diagnosis Date   Coronary artery disease involving native coronary artery of native heart 08/25/2021   Powell/O prostate cancer    was treated with radiation   Hypertension    Local recurrence of prostate cancer (Cesar Powell) 06/21/2004   Qualifier: Diagnosis of  By: Cesar Plan CMA (AAMA), Cesar Powell     Myocardial infarction ([place]) 2022   Obstructive sleep apnea 06/22/2007   NPSG [place] 1979 Unattended Home sleep Study- 05/10/14- Confirms severe OSA, AHI 41.8/ hr,  weight 230 pounds CPAP and also Transcend portable CPAP auto 8-15     Sleep apnea 1977   uses C-pap     Past Surgical History:  Procedure Laterality Date   ABDOMINAL SURGERY     ruptured intestines    CATARACT EXTRACTION W/PHACO Left 01/29/2016   Procedure: CATARACT EXTRACTION PHACO AND INTRAOCULAR LENS PLACEMENT ([place]) left eye;  Surgeon: Cesar Koyanagi, MD;  Location: [place];  Service: Ophthalmology;  Laterality: Left;  RESTOR LENS   CATARACT EXTRACTION W/PHACO Right 10/21/2016   Procedure: CATARACT EXTRACTION PHACO AND INTRAOCULAR LENS PLACEMENT ([facility])  Right restor toric lens;  Surgeon: Cesar Koyanagi, MD;  Location: [place];  Service: Ophthalmology;  Laterality: Right;  restor toric lens   CORONARY/GRAFT ACUTE MI REVASCULARIZATION N/A 05/13/2021   Procedure: Coronary/Graft Acute MI Revascularization;  Surgeon: Cesar Bush, MD;  Location: [place] [facility];  Service: Cardiovascular;  Laterality: N/A;   CYSTOSCOPY WITH LITHOLAPAXY N/A 08/14/2016   Procedure: CYSTOSCOPY WITH LITHOLAPAXY;  Surgeon: Cesar Retort, MD;  Location: [facility];  Service: Urology;  Laterality: N/A;   FEMUR IM NAIL Right 10/08/2014   Procedure: INTRAMEDULLARY (IM) NAIL FEMORAL;  Surgeon: Cesar Can, MD;  Location: [place];  Service: Orthopedics;  Laterality: Right;  PER DANIELLE 110 MIN   HOLMIUM LASER APPLICATION  10/30/5186   Procedure: HOLMIUM LASER APPLICATION;  Surgeon: Cesar Retort, MD;  Location: [facility];  Service: Urology;;   LEFT HEART CATH  AND CORONARY ANGIOGRAPHY N/A 05/13/2021   Procedure: LEFT HEART CATH AND CORONARY ANGIOGRAPHY;  Surgeon: Cesar Bush, MD;  Location: [place] [facility];  Service: Cardiovascular;  Laterality: N/A;   OTHER SURGICAL HISTORY  2006   Prostate Radiation Surgery    RADIOACTIVE SEED IMPLANT N/A 10/30/2019   Procedure: RADIOACTIVE SEED IMPLANT/BRACHYTHERAPY IMPLANT;  Surgeon: Cesar Co, MD;  Location: [facility];   Service: Urology;  Laterality: N/A;  70 seeds implanted    Current Medications: No outpatient medications have been marked as taking for the 04/09/22 encounter (Appointment) with Cesar Powell, Cesar Isbell H, PA-C.     Allergies:   Patient has no known allergies.   Social History   Socioeconomic History   Marital status: Married    Spouse name: Not on file   Number of children: Not on file   Years of education: Not on file   Highest education level: Not on file  Occupational History   Occupation: retired  Tobacco Use   Smoking status: Former    Packs/day: 1.00    Years: 15.00    Total pack years: 15.00    Types: Cigarettes    Quit date: 09/14/1965    Years since quitting: 56.6    Passive exposure: Past   Smokeless tobacco: Never  Vaping Use   Vaping Use: Never used  Substance and Sexual Activity   Alcohol use: Yes    Alcohol/week: 7.0 standard drinks of alcohol    Types: 7 Glasses of wine per week   Drug use: No   Sexual activity: Yes    Birth control/protection: None  Other Topics Concern   Not on file  Social History Narrative   Not on file   Social Determinants of Health   Financial Resource Strain: Low Risk  (12/05/2021)   Overall Financial Resource Strain (CARDIA)    Difficulty of Paying Living Expenses: Not hard at all  Food Insecurity: No Food Insecurity (03/19/2022)   Hunger Vital Sign    Worried About Running Out of Food in the Last Year: Never true    Ran Out of Food in the Last Year: Never true  Transportation Needs: No Transportation Needs (03/19/2022)   PRAPARE - Hydrologist (Medical): No    Lack of Transportation (Non-Medical): No  Physical Activity: Insufficiently Active (12/05/2021)   Exercise Vital Sign    Days of Exercise per Week: 5 days    Minutes of Exercise per Session: 10 min  Stress: No Stress Concern Present (12/05/2021)   [place]    Feeling of Stress : Not  at all  Social Connections: Moderately Isolated (12/05/2021)   Social Connection and Isolation Panel [NHANES]    Frequency of Communication with Friends and Family: More than three times a week    Frequency of Social Gatherings with Friends and Family: More than three times a week    Attends Religious Services: Never    Marine scientist or Organizations: No    Attends Music therapist: Never    Marital Status: Married     Family History: The patient's family history includes Heart attack in his brother; Heart disease in his brother; Lung cancer in his brother. There is no history of Prostate cancer, Bladder Cancer, or Kidney cancer.  ROS:   Please see the history of present illness.     All other systems reviewed and are negative.  EKGs/Labs/Other Studies Reviewed:  The following studies were reviewed today:  Echo 05/15/21  1. Left ventricular ejection fraction, by estimation, is 55 %. The left  ventricle has normal function. The left ventricle has no regional wall  motion abnormalities. Left ventricular diastolic parameters are consistent  with Grade I diastolic dysfunction  (impaired relaxation).   2. Right ventricular systolic function is normal. The right ventricular  size is normal. There is normal pulmonary artery systolic pressure. The  estimated right ventricular systolic pressure is 38.2 mmHg.   3. Left atrial size was moderately dilated.   4. Right atrial size was moderately dilated.   5. The mitral valve is normal in structure. No evidence of mitral valve  regurgitation. Mild mitral stenosis.    LHC 05/13/21 Conclusions: Severe multivessel coronary artery disease, as detailed below.  The culprit lesion for the patient's inferior STEMI is a ruptured plaque with thrombus formation in the proximal RCA leading to 95% stenosis and distal embolization of clot. There is moderate-severe LAD and LCx disease that appears chronic. Mildly elevated left  ventricular filling pressure (LVEDP 20 mmHg). Successful PCI to the proximal RCA using Onyx Frontier 3.0 x 15 mm drug-eluting stent (postdilated to 3.4 mm) with 0% residual stenosis and TIMI-3 flow. Right radial artery spasm precluding advancement of 63F guide catheter.  Consider alternative access if interventions are needed in the future. Recommendations: Dual antiplatelet therapy with aspirin and ticagrelor for at least 12 months. Favor medical management of residual LCx, LAD, and RCA disease unless the patient has refractory angina. Aggressive secondary prevention. Close monitoring of hemoglobin.  Will need to consider PRBC transfusion for symptomatic anemia or significant drops in hemoglobin, though I would ideally like to avoid transfusion given associated risk for stent thrombosis. Obtain echocardiogram.  Left Heart   Left Ventricle LV end diastolic pressure is mildly elevated. LVEDP 20 mmHg.  Aortic Valve There is no aortic valve stenosis.    Coronary Diagrams   Diagnostic Dominance: Right Intervention          EKG:  EKG is *** ordered today.  The ekg ordered today demonstrates ***  Recent Labs: 05/14/2021: TSH 6.122 02/21/2022: Magnesium 2.2 03/14/2022: B Natriuretic Peptide 141.6 04/03/2022: ALT 14; BUN 31; Creatinine, Ser 1.50; Hemoglobin 9.0; Platelets 282; Potassium 4.2; Sodium 134  Recent Lipid Panel    Component Value Date/Time   CHOL 150 05/14/2021 0433   TRIG 58 05/14/2021 0433   HDL 49 05/14/2021 0433   CHOLHDL 3.1 05/14/2021 0433   VLDL 12 05/14/2021 0433   LDLCALC 89 05/14/2021 0433   LDLDIRECT 148.9 12/07/2007 1217     Risk Assessment/Calculations:   {Does this patient have ATRIAL FIBRILLATION?[phone]}   Physical Exam:    VS:  There were no vitals taken for this visit.    Wt Readings from Last 3 Encounters:  04/03/22 193 lb 9.6 oz (87.8 kg)  03/13/22 192 lb 4 oz (87.2 kg)  03/02/22 192 lb 4 oz (87.2 kg)     GEN: *** Well nourished, well  developed in no acute distress HEENT: Normal NECK: No JVD; No carotid bruits LYMPHATICS: No lymphadenopathy CARDIAC: ***RRR, no murmurs, rubs, gallops RESPIRATORY:  Clear to auscultation without rales, wheezing or rhonchi  ABDOMEN: Soft, non-tender, non-distended MUSCULOSKELETAL:  No edema; No deformity  SKIN: Warm and dry NEUROLOGIC:  Alert and oriented x 3 PSYCHIATRIC:  Normal affect   ASSESSMENT:    No diagnosis found. PLAN:    In order of problems listed above:  Hematuria Acute on chronic anemia  UTI  CAD s/p PCI 05/2021  AKI  HFpEF  Disposition: Follow up {follow up:15908} with ***   Shared Decision Making/Informed Consent   {Are you ordering a CV Procedure (e.g. stress test, cath, DCCV, TEE, etc)?   Press F2        :[id number]}    Signed, Abisai Deer Arlyss Repress  04/09/2022 7:43 AM    [facility]

## 2022-04-10 ENCOUNTER — Encounter: Payer: Self-pay | Admitting: Medical

## 2022-04-16 ENCOUNTER — Other Ambulatory Visit: Payer: Medicare Other | Admitting: Urology

## 2022-04-21 DIAGNOSIS — M9903 Segmental and somatic dysfunction of lumbar region: Secondary | ICD-10-CM | POA: Diagnosis not present

## 2022-04-21 DIAGNOSIS — M9905 Segmental and somatic dysfunction of pelvic region: Secondary | ICD-10-CM | POA: Diagnosis not present

## 2022-04-21 DIAGNOSIS — M62830 Muscle spasm of back: Secondary | ICD-10-CM | POA: Diagnosis not present

## 2022-04-21 DIAGNOSIS — M5136 Other intervertebral disc degeneration, lumbar region: Secondary | ICD-10-CM | POA: Diagnosis not present

## 2022-04-23 ENCOUNTER — Encounter: Payer: Self-pay | Admitting: Urology

## 2022-04-23 ENCOUNTER — Ambulatory Visit (INDEPENDENT_AMBULATORY_CARE_PROVIDER_SITE_OTHER): Payer: Medicare Other | Admitting: Urology

## 2022-04-23 VITALS — Ht 67.0 in | Wt 192.0 lb

## 2022-04-23 DIAGNOSIS — C61 Malignant neoplasm of prostate: Secondary | ICD-10-CM | POA: Diagnosis not present

## 2022-04-23 NOTE — Progress Notes (Signed)
04/23/2022 3:33 PM   Cesar Powell 11-07-1930 [id number]  Reason for visit: Follow up metastatic prostate cancer, history of urinary retention, incontinence, recent admission for gross hematuria  HPI: Complex 88 year old male with metastatic prostate cancer who has deferred ADT. Originally treated with radiation in 2005, and was on lupron for biochemical recurrence prior to discontinuing secondary to side effects. He has a history of cystolitholopaxy by Dr Pilar Jarvis in 2018. He also underwent brachytherapy and XRT to abdominal nodes in 2021 with Dr Baruch Gouty.  He also remains on dual anticoagulation after an MI in October 2022.  Was hospitalized mid May 2023 after a fall and also noted to have anemia requiring blood transfusion.  He was treated for a possible UTI at that time, but no urine culture was sent.  In the fall, he developed recurrent urinary retention that was managed by multiple on-call urology providers, and unclear if this was secondary to urethral stricture, bladder neck contracture, prostate obstruction, or atonic bladder.  Cystoscopy with me in November 2022 showed slightly shaggy appearance of the prostatic urethra consistent with prior radiation and possible local recurrence of prostate cancer, but no evidence of stricture and the cystoscope passed easily into the bladder without any resistance.  He was originally on intermittent catheterization and doing well, but over the last 6 months has been floridly incontinent leaking urine with low PVRs.  He is not even voiding spontaneously, just leaking into her depends.  He is primarily bothered by the leaking overnight.  We have tried numerous things including Cunningham clamp, condom catheter, and other collection devices without significant improvement.     His prostate cancer is currently managed with oncology, and it looks like he was temporarily on Orgovyx, the oral ADT, however he discontinued that medication recently as he felt like  it was making him fall asleep while driving.  We again discussed the risk and benefits of ADT, and I do think this would improve his recurrent hematuria likely from local recurrence of prostate cancer, but he deferred secondary to side effects of severe fatigue.  He was admitted on 03/14/2022 for gross hematuria and retention of unclear etiology.  He was on CBI temporarily, and urine ultimately cleared.  He is now off all anticoagulation.  He has returned to his baseline incontinence, most bothersome overnight.  He catheterize once a few nights ago to see if he got any volume, but got only a minimal amount of urine.  No further problems with gross hematuria now that he is off anticoagulation.  I had another frank conversation with the patient that he has had a very complex history with varying urinary retention and incontinence, likely with combination of local recurrence of prostate cancer combined with long-term incomplete bladder emptying, brachytherapy, and radiation.  I am not sure that we have a good solution for his incontinence or hematuria at this point.  He was originally scheduled for cystoscopy today, but we discussed that with his florid incontinence, I am not sure I have any surgical options to offer him to improve his continence, and cystoscopy would be more to evaluate for obstruction or etiology of his gross hematuria.  They are hesitant to pursue cystoscopy today because of risk of bleeding or infection which is understandable.  Again, I do not think I have any options for his ongoing incontinence at this point.  Really the only thing that has been helpful for him is a pure wick, but this is unable to be obtained outpatient.  He has deferred further treatments with oncology and is primarily working on diet strategies alone for his prostate cancer.  I had previously referred him to palliative care but he canceled that visit.  Again, I think we need to have realistic expectations moving  forward.  Continue follow-up with oncology with ongoing imaging RTC with urology every 3 to 4 months for PVR and symptom monitoring  [facility], [place] [address] [place], [place] [address] [phone]

## 2022-04-23 NOTE — Patient Instructions (Signed)
The Benefits of a Plant-Based Diet for Urology Health  A plant-based diet emphasizes the consumption of whole, unprocessed plant foods while minimizing or excluding animal products including meat and dairy products. This dietary approach has gained attention for its potential to promote overall health, including urology-related conditions. Incorporating a plant-based diet into your lifestyle can offer numerous benefits for maintaining optimal urology health.  1. Reduced Risk of Kidney Stones: A plant-based diet is typically rich in fruits, vegetables, legumes, and whole grains. These foods are high in dietary fiber, potassium, and magnesium, which can help reduce the risk of developing kidney stones. Be careful to avoid high quantities of spinach, as these can contribute to kidney stone formation if eaten in large volumes. The increased intake of water-soluble fiber can enhance the excretion of waste products and prevent the crystallization of minerals that lead to stone formation.  2. Improved Prostate Health: Studies have suggested a link between the consumption of red and processed meats and an increased risk of prostate problems, including benign prostatic hyperplasia (BPH) and prostate cancer. By adopting a plant-based diet, you can lower your intake of saturated fats and decrease the risk of these conditions. PSA levels can often decrease on plant based diets! Plant foods are also rich in antioxidants and phytochemicals that have been associated with prostate health.  3. Better Bladder Function: A diet focused on plant-based foods can contribute to better bladder health by reducing the risk of urinary tract infections (UTIs). Berries, citrus fruits, and leafy greens are known for their high vitamin C content, which can acidify urine and create an environment less favorable for bacteria growth. Additionally, plant-based diets are generally lower in sodium, which can help prevent fluid retention and  reduce the strain on the bladder.  4. Management of Erectile Dysfunction (ED): Some research suggests that a plant-based diet can positively impact erectile function. Plant-based diets are associated with improved cardiovascular health, which is crucial for maintaining healthy blood flow and nerve function required for proper erectile function. By reducing the consumption of high-cholesterol and high-saturated fat animal products, a plant-based diet may contribute to a decreased risk of ED.  5. Prevention of Chronic Conditions: A plant-based diet can help prevent or manage chronic conditions such as obesity, diabetes, and hypertension. These conditions can contribute to urology-related issues, including urinary incontinence and kidney dysfunction. By maintaining a healthy weight and managing these conditions, you can reduce the risk of urology-related complications.  Conclusion: Embracing a plant-based diet can offer significant benefits for urology health. By incorporating a variety of colorful fruits, vegetables, whole grains, nuts, seeds, and legumes into your meals, you can support kidney health, prostate health, bladder function, and overall well-being. Remember to consult with a healthcare professional or registered dietitian before making any significant dietary changes, especially if you have existing health conditions. Your personalized approach to a plant-based diet can contribute to improved urology health and enhance your quality of life.      

## 2022-04-28 DIAGNOSIS — Z961 Presence of intraocular lens: Secondary | ICD-10-CM | POA: Diagnosis not present

## 2022-04-30 ENCOUNTER — Encounter: Payer: Self-pay | Admitting: Oncology

## 2022-05-04 ENCOUNTER — Telehealth: Payer: Self-pay | Admitting: *Deleted

## 2022-05-04 ENCOUNTER — Inpatient Hospital Stay: Payer: Medicare Other | Attending: Oncology

## 2022-05-04 DIAGNOSIS — C61 Malignant neoplasm of prostate: Secondary | ICD-10-CM | POA: Diagnosis not present

## 2022-05-04 LAB — CBC WITH DIFFERENTIAL/PLATELET
Abs Immature Granulocytes: 0.01 10*3/uL (ref 0.00–0.07)
Basophils Absolute: 0 10*3/uL (ref 0.0–0.1)
Basophils Relative: 1 %
Eosinophils Absolute: 0.6 10*3/uL — ABNORMAL HIGH (ref 0.0–0.5)
Eosinophils Relative: 10 %
HCT: 28.4 % — ABNORMAL LOW (ref 39.0–52.0)
Hemoglobin: 9.2 g/dL — ABNORMAL LOW (ref 13.0–17.0)
Immature Granulocytes: 0 %
Lymphocytes Relative: 10 %
Lymphs Abs: 0.7 10*3/uL (ref 0.7–4.0)
MCH: 30.4 pg (ref 26.0–34.0)
MCHC: 32.4 g/dL (ref 30.0–36.0)
MCV: 93.7 fL (ref 80.0–100.0)
Monocytes Absolute: 0.6 10*3/uL (ref 0.1–1.0)
Monocytes Relative: 10 %
Neutro Abs: 4.4 10*3/uL (ref 1.7–7.7)
Neutrophils Relative %: 69 %
Platelets: 321 10*3/uL (ref 150–400)
RBC: 3.03 MIL/uL — ABNORMAL LOW (ref 4.22–5.81)
RDW: 15.5 % (ref 11.5–15.5)
WBC: 6.3 10*3/uL (ref 4.0–10.5)
nRBC: 0 % (ref 0.0–0.2)

## 2022-05-04 NOTE — Telephone Encounter (Signed)
Called the pt to let him know that hgb 9.2 and pt does not need transfusion. I left message on one phone and the other number did not have a message to leave a message.  I have cancelled the appt. For tom.

## 2022-05-05 ENCOUNTER — Inpatient Hospital Stay: Payer: Medicare Other

## 2022-05-06 LAB — SAMPLE TO BLOOD BANK

## 2022-05-07 ENCOUNTER — Ambulatory Visit: Payer: Medicare Other | Admitting: Urology

## 2022-05-11 DIAGNOSIS — I10 Essential (primary) hypertension: Secondary | ICD-10-CM | POA: Diagnosis not present

## 2022-05-11 DIAGNOSIS — Z923 Personal history of irradiation: Secondary | ICD-10-CM | POA: Diagnosis not present

## 2022-05-11 DIAGNOSIS — C61 Malignant neoplasm of prostate: Secondary | ICD-10-CM | POA: Diagnosis not present

## 2022-05-11 DIAGNOSIS — Z79899 Other long term (current) drug therapy: Secondary | ICD-10-CM | POA: Diagnosis not present

## 2022-05-11 DIAGNOSIS — D649 Anemia, unspecified: Secondary | ICD-10-CM | POA: Diagnosis not present

## 2022-05-12 ENCOUNTER — Other Ambulatory Visit: Payer: Self-pay | Admitting: Medical

## 2022-05-19 DIAGNOSIS — M62830 Muscle spasm of back: Secondary | ICD-10-CM | POA: Diagnosis not present

## 2022-05-19 DIAGNOSIS — M9903 Segmental and somatic dysfunction of lumbar region: Secondary | ICD-10-CM | POA: Diagnosis not present

## 2022-05-19 DIAGNOSIS — M9905 Segmental and somatic dysfunction of pelvic region: Secondary | ICD-10-CM | POA: Diagnosis not present

## 2022-05-19 DIAGNOSIS — M5136 Other intervertebral disc degeneration, lumbar region: Secondary | ICD-10-CM | POA: Diagnosis not present

## 2022-06-03 ENCOUNTER — Ambulatory Visit
Admission: RE | Admit: 2022-06-03 | Discharge: 2022-06-03 | Disposition: A | Discharge status: Home / Self Care | Payer: Medicare Other | Source: Ambulatory Visit | Attending: Oncology | Admitting: Oncology

## 2022-06-03 ENCOUNTER — Encounter
Admission: RE | Admit: 2022-06-03 | Discharge: 2022-06-03 | Disposition: A | Discharge status: Home / Self Care | Payer: Medicare Other | Source: Ambulatory Visit | Attending: Oncology | Admitting: Oncology

## 2022-06-03 DIAGNOSIS — K6289 Other specified diseases of anus and rectum: Secondary | ICD-10-CM | POA: Diagnosis not present

## 2022-06-03 DIAGNOSIS — K439 Ventral hernia without obstruction or gangrene: Secondary | ICD-10-CM | POA: Diagnosis not present

## 2022-06-03 DIAGNOSIS — I700 Atherosclerosis of aorta: Secondary | ICD-10-CM | POA: Diagnosis not present

## 2022-06-03 DIAGNOSIS — C61 Malignant neoplasm of prostate: Secondary | ICD-10-CM

## 2022-06-03 DIAGNOSIS — R590 Localized enlarged lymph nodes: Secondary | ICD-10-CM | POA: Diagnosis not present

## 2022-06-03 DIAGNOSIS — K5790 Diverticulosis of intestine, part unspecified, without perforation or abscess without bleeding: Secondary | ICD-10-CM | POA: Diagnosis not present

## 2022-06-03 DIAGNOSIS — I2510 Atherosclerotic heart disease of native coronary artery without angina pectoris: Secondary | ICD-10-CM | POA: Diagnosis not present

## 2022-06-03 LAB — POCT I-STAT CREATININE: Creatinine, Ser: 1.3 mg/dL — ABNORMAL HIGH (ref 0.61–1.24)

## 2022-06-03 MED ORDER — IOHEXOL 300 MG/ML  SOLN
100.0000 mL | Freq: Once | INTRAMUSCULAR | Status: AC | PRN
  Administered 2022-06-03: 100 mL via INTRAVENOUS

## 2022-06-03 MED ORDER — TECHNETIUM TC 99M MEDRONATE IV KIT
20.0000 | PACK | Freq: Once | INTRAVENOUS | Status: AC | PRN
  Administered 2022-06-03: 21.04 via INTRAVENOUS

## 2022-06-05 ENCOUNTER — Inpatient Hospital Stay: Payer: Medicare Other

## 2022-06-05 ENCOUNTER — Inpatient Hospital Stay: Payer: Medicare Other | Attending: Oncology | Admitting: Oncology

## 2022-06-05 ENCOUNTER — Encounter: Payer: Self-pay | Admitting: Oncology

## 2022-06-05 ENCOUNTER — Telehealth: Payer: Self-pay | Admitting: *Deleted

## 2022-06-05 VITALS — BP 156/63 | HR 53 | Temp 97.8°F | Ht 67.0 in | Wt 184.4 lb

## 2022-06-05 DIAGNOSIS — C61 Malignant neoplasm of prostate: Secondary | ICD-10-CM

## 2022-06-05 DIAGNOSIS — D649 Anemia, unspecified: Secondary | ICD-10-CM | POA: Diagnosis not present

## 2022-06-05 DIAGNOSIS — C7951 Secondary malignant neoplasm of bone: Secondary | ICD-10-CM | POA: Diagnosis not present

## 2022-06-05 LAB — CBC WITH DIFFERENTIAL/PLATELET
Abs Immature Granulocytes: 0.03 10*3/uL (ref 0.00–0.07)
Basophils Absolute: 0 10*3/uL (ref 0.0–0.1)
Basophils Relative: 1 %
Eosinophils Absolute: 0.4 10*3/uL (ref 0.0–0.5)
Eosinophils Relative: 7 %
HCT: 29.6 % — ABNORMAL LOW (ref 39.0–52.0)
Hemoglobin: 9.3 g/dL — ABNORMAL LOW (ref 13.0–17.0)
Immature Granulocytes: 1 %
Lymphocytes Relative: 12 %
Lymphs Abs: 0.7 10*3/uL (ref 0.7–4.0)
MCH: 30.1 pg (ref 26.0–34.0)
MCHC: 31.4 g/dL (ref 30.0–36.0)
MCV: 95.8 fL (ref 80.0–100.0)
Monocytes Absolute: 0.5 10*3/uL (ref 0.1–1.0)
Monocytes Relative: 9 %
Neutro Abs: 3.9 10*3/uL (ref 1.7–7.7)
Neutrophils Relative %: 70 %
Platelets: 274 10*3/uL (ref 150–400)
RBC: 3.09 MIL/uL — ABNORMAL LOW (ref 4.22–5.81)
RDW: 15.2 % (ref 11.5–15.5)
WBC: 5.5 10*3/uL (ref 4.0–10.5)
nRBC: 0 % (ref 0.0–0.2)

## 2022-06-05 LAB — COMPREHENSIVE METABOLIC PANEL
ALT: 12 U/L (ref 0–44)
AST: 22 U/L (ref 15–41)
Albumin: 3.6 g/dL (ref 3.5–5.0)
Alkaline Phosphatase: 58 U/L (ref 38–126)
Anion gap: 7 (ref 5–15)
BUN: 22 mg/dL (ref 8–23)
CO2: 26 mmol/L (ref 22–32)
Calcium: 9.1 mg/dL (ref 8.9–10.3)
Chloride: 103 mmol/L (ref 98–111)
Creatinine, Ser: 1.17 mg/dL (ref 0.61–1.24)
GFR, Estimated: 60 mL/min — ABNORMAL LOW (ref >60)
Glucose, Bld: 99 mg/dL (ref 70–99)
Potassium: 5 mmol/L (ref 3.5–5.1)
Sodium: 136 mmol/L (ref 135–145)
Total Bilirubin: 0.6 mg/dL (ref 0.3–1.2)
Total Protein: 6.8 g/dL (ref 6.5–8.1)

## 2022-06-05 LAB — PSA: Prostatic Specific Antigen: 20.07 ng/mL — ABNORMAL HIGH (ref 0.00–4.00)

## 2022-06-05 LAB — SAMPLE TO BLOOD BANK

## 2022-06-05 NOTE — Progress Notes (Signed)
Hematology/Oncology Consult note [facility]  Telephone:([phone] [phone]  Patient Care Team: Owens Loffler, MD as PCP - General Rockey Situ, Kathlene November, MD as PCP - Cardiology (Cardiology) Leandrew Koyanagi, MD as Referring Physician (Ophthalmology) Nickie Retort, MD (Inactive) as Consulting Physician (Urology) Noreene Filbert, MD as Radiation Oncologist (Radiation Oncology)   Name of the patient: Cesar Powell  [id number]  August 08, 1930   Date of visit: 06/05/22  Diagnosis- castrate resistant metastatic prostate cancer   Chief complaint/ Reason for visit-routine follow-up of prostate cancer and anemia  Heme/Onc history: patient is a 88 year old gentleman with no significant past medical problems.  He has a history of prostate cancer that was diagnosed and treated with radiation in 2005.  At that point he his PSA was about 0.03 for a long time.  Then started on Axiron for low testosterone.  PSA eventually went up to as high as 18 2015.  At that point he was started on Lupron his PSA doubling time is around 10 months. Most recently since October 2018 after his PSA went down from 3.6-1.7 and has been gradually increasing to 1.3,1.5 and 2 indicating a PSA doubling time of 9.9 months.     patient  seen by me in September 2019 discussed adding oral antiandrogens like apalutamide versus enzalutamide to Lupron in castrate resistant nonmetastatic prostate cancer.  After discussing risks and benefits patient did not wish to proceed.Patient stopped lupron in feb 2020 but did restart after 1 year in February 2021.  Patient received IMRT for his pelvic lymph nodes.  Patient was started on relugolix in June 2023.  Patient stopped taking it in August 2023 due to problems with sleeping   Patient has had significant anemia with a hemoglobin that has fluctuated between 8-9 since August 2022.  Prior to that his hemoglobin was stable between 11-12.Results of  anemia work-up in the past have been as follows: Ferritin levels elevated in the 400s.  TIBC low at 225 with an iron saturation of 12%.  ANA comprehensive panel negative.  TSH and folate normal.  LDH normal.  Haptoglobin elevated at 427.  B12 levels normal.  Patient had a bone marrow biopsy given his significant anemia which shows slightly hypercellular marrow with myeloid hyperplasia but no evidence of leukemia lymphoma or high-grade dysplasia.  Erythroid precursors were decreased in numbers with occasional atypia.  Granulocytic precursors no overt dysplasia and megakaryocytes quantitatively and qualitatively unremarkable  Interval history-patient reports that he is currently on a sugar-free diet and he feels like it is helping his prostate cancer.  He has chronic urinary incontinence.  He has seen Dr. Glori Luis but there is not much that can be done about it.  ECOG PS- 2 Pain scale- 0 Opioid associated constipation- no  Review of systems- Review of Systems  Constitutional:  Positive for malaise/fatigue. Negative for chills, fever and weight loss.  HENT:  Negative for congestion, ear discharge and nosebleeds.   Eyes:  Negative for blurred vision.  Respiratory:  Negative for cough, hemoptysis, sputum production, shortness of breath and wheezing.   Cardiovascular:  Negative for chest pain, palpitations, orthopnea and claudication.  Gastrointestinal:  Negative for abdominal pain, blood in stool, constipation, diarrhea, heartburn, melena, nausea and vomiting.  Genitourinary:  Negative for dysuria, flank pain, frequency, hematuria and urgency.       Urinary incontinence  Musculoskeletal:  Negative for back pain, joint pain and myalgias.  Skin:  Negative for rash.  Neurological:  Negative for dizziness,  tingling, focal weakness, seizures, weakness and headaches.  Endo/Heme/Allergies:  Does not bruise/bleed easily.  Psychiatric/Behavioral:  Negative for depression and suicidal ideas. The patient does  not have insomnia.       No Known Allergies   Past Medical History:  Diagnosis Date   Coronary artery disease involving native coronary artery of native heart 08/25/2021   H/O prostate cancer    was treated with radiation   Hypertension    Local recurrence of prostate cancer ([place]) 06/21/2004   Qualifier: Diagnosis of  By: Fuller Plan CMA (AAMA), [place]     Myocardial infarction ([place]) 2022   Obstructive sleep apnea 06/22/2007   NPSG [place] 1979 Unattended Home sleep Study- 05/10/14- Confirms severe OSA, AHI 41.8/ hr, weight 230 pounds CPAP and also Transcend portable CPAP auto 8-15     Sleep apnea 1977   uses C-pap      Past Surgical History:  Procedure Laterality Date   ABDOMINAL SURGERY     ruptured intestines    CATARACT EXTRACTION W/PHACO Left 01/29/2016   Procedure: CATARACT EXTRACTION PHACO AND INTRAOCULAR LENS PLACEMENT ([place]) left eye;  Surgeon: Leandrew Koyanagi, MD;  Location: [place];  Service: Ophthalmology;  Laterality: Left;  RESTOR LENS   CATARACT EXTRACTION W/PHACO Right 10/21/2016   Procedure: CATARACT EXTRACTION PHACO AND INTRAOCULAR LENS PLACEMENT ([facility])  Right restor toric lens;  Surgeon: Leandrew Koyanagi, MD;  Location: [place];  Service: Ophthalmology;  Laterality: Right;  restor toric lens   CORONARY/GRAFT ACUTE MI REVASCULARIZATION N/A 05/13/2021   Procedure: Coronary/Graft Acute MI Revascularization;  Surgeon: Nelva Bush, MD;  Location: [place] [facility];  Service: Cardiovascular;  Laterality: N/A;   CYSTOSCOPY WITH LITHOLAPAXY N/A 08/14/2016   Procedure: CYSTOSCOPY WITH LITHOLAPAXY;  Surgeon: Nickie Retort, MD;  Location: [facility];  Service: Urology;  Laterality: N/A;   FEMUR IM NAIL Right 10/08/2014   Procedure: INTRAMEDULLARY (IM) NAIL FEMORAL;  Surgeon: Rod Can, MD;  Location: [place];  Service: Orthopedics;  Laterality: Right;  PER DANIELLE 110 MIN   HOLMIUM LASER APPLICATION  0/48/8891   Procedure: HOLMIUM LASER  APPLICATION;  Surgeon: Nickie Retort, MD;  Location: [facility];  Service: Urology;;   LEFT HEART CATH AND CORONARY ANGIOGRAPHY N/A 05/13/2021   Procedure: LEFT HEART CATH AND CORONARY ANGIOGRAPHY;  Surgeon: Nelva Bush, MD;  Location: [place] [facility];  Service: Cardiovascular;  Laterality: N/A;   OTHER SURGICAL HISTORY  2006   Prostate Radiation Surgery    RADIOACTIVE SEED IMPLANT N/A 10/30/2019   Procedure: RADIOACTIVE SEED IMPLANT/BRACHYTHERAPY IMPLANT;  Surgeon: Billey Co, MD;  Location: [facility];  Service: Urology;  Laterality: N/A;  22 seeds implanted    Social History   Socioeconomic History   Marital status: Married    Spouse name: Not on file   Number of children: Not on file   Years of education: Not on file   Highest education level: Not on file  Occupational History   Occupation: retired  Tobacco Use   Smoking status: Former    Packs/day: 1.00    Years: 15.00    Total pack years: 15.00    Types: Cigarettes    Quit date: 09/14/1965    Years since quitting: 56.7    Passive exposure: Past   Smokeless tobacco: Never  Vaping Use   Vaping Use: Never used  Substance and Sexual Activity   Alcohol use: Yes    Alcohol/week: 7.0 standard drinks of alcohol    Types: 7 Glasses  of wine per week   Drug use: No   Sexual activity: Yes    Birth control/protection: None  Other Topics Concern   Not on file  Social History Narrative   Not on file   Social Determinants of Health   Financial Resource Strain: Low Risk  (12/05/2021)   Overall Financial Resource Strain (CARDIA)    Difficulty of Paying Living Expenses: Not hard at all  Food Insecurity: No Food Insecurity (03/19/2022)   Hunger Vital Sign    Worried About Running Out of Food in the Last Year: Never true    Ran Out of Food in the Last Year: Never true  Transportation Needs: No Transportation Needs (03/19/2022)   PRAPARE - Hydrologist (Medical): No    Lack of  Transportation (Non-Medical): No  Physical Activity: Insufficiently Active (12/05/2021)   Exercise Vital Sign    Days of Exercise per Week: 5 days    Minutes of Exercise per Session: 10 min  Stress: No Stress Concern Present (12/05/2021)   [place]    Feeling of Stress : Not at all  Social Connections: Moderately Isolated (12/05/2021)   Social Connection and Isolation Panel [NHANES]    Frequency of Communication with Friends and Family: More than three times a week    Frequency of Social Gatherings with Friends and Family: More than three times a week    Attends Religious Services: Never    Marine scientist or Organizations: No    Attends Archivist Meetings: Never    Marital Status: Married  Human resources officer Violence: Not At Risk (12/05/2021)   Humiliation, Afraid, Rape, and Kick questionnaire    Fear of Current or Ex-Partner: No    Emotionally Abused: No    Physically Abused: No    Sexually Abused: No    Family History  Problem Relation Age of Onset   Heart disease Brother    Heart attack Brother    Lung cancer Brother    Prostate cancer Neg Hx    Bladder Cancer Neg Hx    Kidney cancer Neg Hx      Current Outpatient Medications:    atorvastatin (LIPITOR) 80 MG tablet, Take 1 tablet (80 mg total) by mouth daily., Disp: 90 tablet, Rfl: 3   losartan (COZAAR) 25 MG tablet, Take 1 tablet (25 mg total) by mouth daily., Disp: 30 tablet, Rfl: 0   tamsulosin (FLOMAX) 0.4 MG CAPS capsule, Take 0.4 mg by mouth daily after supper., Disp: , Rfl:    nitroGLYCERIN (NITROSTAT) 0.4 MG SL tablet, Place 0.4 mg under the tongue every 5 (five) minutes as needed for chest pain. (Patient not taking: Reported on 04/03/2022), Disp: , Rfl:   Physical exam:  Vitals:   06/05/22 1034  BP: (!) 156/63  Pulse: (!) 53  Temp: 97.8 F (36.6 C)  TempSrc: Oral  Weight: 184 lb 6.4 oz (83.6 kg)  Height: _0  (1.702 m)    Physical Exam Constitutional:      Comments: He is somewhat frail appearing.  Appears in no acute distress  Cardiovascular:     Rate and Rhythm: Normal rate and regular rhythm.     Heart sounds: Normal heart sounds.  Pulmonary:     Effort: Pulmonary effort is normal.     Breath sounds: Normal breath sounds.  Abdominal:     General: Bowel sounds are normal.     Palpations: Abdomen is soft.  Skin:    General: Skin is warm and dry.  Neurological:     Mental Status: He is alert and oriented to person, place, and time.         Latest Ref Rng & Units 06/05/2022   10:12 AM  CMP  Glucose 70 - 99 mg/dL 99   BUN 8 - 23 mg/dL 22   Creatinine 0.61 - 1.24 mg/dL 1.17   Sodium 135 - 145 mmol/L 136   Potassium 3.5 - 5.1 mmol/L 5.0   Chloride 98 - 111 mmol/L 103   CO2 22 - 32 mmol/L 26   Calcium 8.9 - 10.3 mg/dL 9.1   Total Protein 6.5 - 8.1 g/dL 6.8   Total Bilirubin 0.3 - 1.2 mg/dL 0.6   Alkaline Phos 38 - 126 U/L 58   AST 15 - 41 U/L 22   ALT 0 - 44 U/L 12       Latest Ref Rng & Units 06/05/2022   10:12 AM  CBC  WBC 4.0 - 10.5 K/uL 5.5   Hemoglobin 13.0 - 17.0 g/dL 9.3   Hematocrit 39.0 - 52.0 % 29.6   Platelets 150 - 400 K/uL 274     No images are attached to the encounter.  CT CHEST ABDOMEN PELVIS W CONTRAST  Result Date: 06/04/2022 CLINICAL DATA:  Follow-up prostate cancer; * Tracking Code: BO * EXAM: CT CHEST, ABDOMEN, AND PELVIS WITH CONTRAST TECHNIQUE: Multidetector CT imaging of the chest, abdomen and pelvis was performed following the standard protocol during bolus administration of intravenous contrast. RADIATION DOSE REDUCTION: This exam was performed according to the departmental dose-optimization program which includes automated exposure control, adjustment of the mA and/or kV according to patient size and/or use of iterative reconstruction technique. CONTRAST:  149m OMNIPAQUE IOHEXOL 300 MG/ML  SOLN COMPARISON:  Multiple priors, most recent CT abdomen and pelvis  dated February 20, 2022 FINDINGS: CT CHEST FINDINGS Cardiovascular: Normal heart size. No pericardial effusion. Mitral annular calcifications. Severe left main and three-vessel coronary artery calcifications. Normal caliber thoracic aorta with severe atherosclerotic disease. Mediastinum/Nodes: Esophagus and thyroid are unremarkable. No pathologically enlarged lymph nodes seen in the chest. Lungs/Pleura: Central airways are patent. No consolidation, pleural effusion or pneumothorax. Musculoskeletal: No chest wall mass or suspicious bone lesions identified. CT ABDOMEN PELVIS FINDINGS Hepatobiliary: No focal liver abnormality is seen. No gallstones, gallbladder wall thickening, or biliary dilatation. Pancreas: Unremarkable. No pancreatic ductal dilatation or surrounding inflammatory changes. Spleen: Normal in size without focal abnormality. Adrenals/Urinary Tract: Bilateral adrenal glands are unremarkable. No hydronephrosis or nephrolithiasis. Bilateral low-attenuation renal lesions, largest are compatible with simple cysts, others are too small to completely characterize. Bladder is decompressed and markedly thick-walled perivesicular fat stranding. Stomach/Bowel: Rectal wall thickening and perirectal fat stranding, no fat plane is seen between the rectum and prostate. Diverticulosis. Normal appearing appendix. No evidence of obstruction. Vascular/Lymphatic: Enlarged right retrocrural and retroperitoneal lymph nodes, unchanged when compared with the prior exam. Reference right pericaval lymph node measuring 1.5 cm on series 2, image 74. Mesenteric calcification located on series 5, image 31, unchanged when compared with April 02, 2021 prior, likely treated metastatic disease Reproductive: An area of dystrophic calcification located medial to the right pubic symphysis is decreased size when compared with prior exam. Fluid collection at the area of the prostate measuring approximately 4.0 x 2.4 cm on series 2 image 114  containing small locules of air and tracking anteriorly to the pubic symphysis. Additionally there is widening and osseous erosions pubic symphysis. Other:  Small fat containing ventral abdominal wall hernia. Musculoskeletal: Irregularity and widening at the pubic symphysis, increased when compared with prior exams. Unchanged sclerotic wedge deformity of L1 and T12 sclerosis. New subtle cortical irregularity of the bilateral sacral ala, concerning for sacral insufficiency fractures. Similar sacral lucency which is likely due to demineralization. IMPRESSION: 1. Fluid collection at the area of the prostate containing small locules of air tracking anteriorly to the pubic symphysis with associated widening and irregularity of the pubic symphysis, findings are concerning for progressive necrotic prostate tumor versus prostate abscess and osteomyelitis. Pelvic MRI with contrast is recommended further evaluation. 2. Rectal wall thickening and perirectal fat stranding, concerning for secondary involvement. 3. Bladder is decompressed and markedly thick-walled with perivesicular fat stranding, findings are compatible with cystitis. 4. Enlarged retroperitoneal lymph nodes, unchanged when compared with prior exam, and consistent with metastatic disease. 5. New subtle cortical irregularity of the bilateral sacral ala, concerning for sacral insufficiency fractures. 6. Aortic Atherosclerosis (ICD10-I70.0). These results will be called to the ordering clinician or representative by the Radiologist Assistant, and communication documented in the PACS or [employer]. Electronically Signed   By: Yetta Glassman M.D.   On: 06/04/2022 18:38     Assessment and plan- Patient is a 88 y.o. male who is here for follow-up of following issues:  Castrate resistant metastatic prostate cancer:Reviewed CT chest abdomen pelvis images independently and discussed findings with the patient which shows fluid collection around the area of  prostate.  Findings are concerning for progressive necrotic prostate tumor versus prostatic abscess.  Patient does not report any new symptoms such as pelvic pain or fever.  He has chronic urinary incontinence.  I am obtaining an MRI of the pelvis with contrast to characterize this further but not sure if it will add to any more information.  Patient has been hesitant to get his prostate cancer treated with any ADT.  I will see him back after MRI results are back.  PSA from today is pending and over the last 4 months his PSA has been fluctuating between 18-25 without a clear rising trend  Normocytic anemia: Likely secondary to chronic disease.  He was seen for second opinion at [facility] as well.  I do not think that we are dealing with iron deficiency.  Storage iron on bone marrow was abundant and his iron studies have not been indicating of iron deficiency.  His hemoglobin is somewhat improved from 8-9.2 today.  There is a potential risk of worsening of prostate cancer if we attempt to give him EPO.  Patient is hesitant to do it as well.  Continue to monitor   Visit Diagnosis 1. Prostate cancer ([place])   2. Normocytic anemia      Dr. Randa Evens, MD, MPH [facility] [phone] 06/05/2022 12:15 PM

## 2022-06-05 NOTE — Telephone Encounter (Signed)
Called report IMPRESSION: 1. Fluid collection at the area of the prostate containing small locules of air tracking anteriorly to the pubic symphysis with associated widening and irregularity of the pubic symphysis, findings are concerning for progressive necrotic prostate tumor versus prostate abscess and osteomyelitis. Pelvic MRI with contrast is recommended further evaluation. 2. Rectal wall thickening and perirectal fat stranding, concerning for secondary involvement. 3. Bladder is decompressed and markedly thick-walled with perivesicular fat stranding, findings are compatible with cystitis. 4. Enlarged retroperitoneal lymph nodes, unchanged when compared with prior exam, and consistent with metastatic disease. 5. New subtle cortical irregularity of the bilateral sacral ala, concerning for sacral insufficiency fractures. 6. Aortic Atherosclerosis (ICD10-I70.0).   These results will be called to the ordering clinician or representative by the Radiologist Assistant, and communication documented in the PACS or [employer].     Electronically Signed   By: Yetta Glassman M.D.   On: 06/04/2022 18:38

## 2022-06-07 ENCOUNTER — Ambulatory Visit
Admission: RE | Admit: 2022-06-07 | Discharge: 2022-06-07 | Disposition: A | Discharge status: Home / Self Care | Payer: Medicare Other | Source: Ambulatory Visit | Attending: Oncology | Admitting: Oncology

## 2022-06-07 ENCOUNTER — Other Ambulatory Visit: Payer: Self-pay | Admitting: Oncology

## 2022-06-07 DIAGNOSIS — D649 Anemia, unspecified: Secondary | ICD-10-CM

## 2022-06-07 DIAGNOSIS — C61 Malignant neoplasm of prostate: Secondary | ICD-10-CM

## 2022-06-07 MED ORDER — GADOBUTROL 1 MMOL/ML IV SOLN
7.5000 mL | Freq: Once | INTRAVENOUS | Status: AC | PRN
  Administered 2022-06-07: 7.5 mL via INTRAVENOUS

## 2022-06-08 ENCOUNTER — Telehealth: Payer: Self-pay | Admitting: *Deleted

## 2022-06-08 NOTE — Telephone Encounter (Signed)
Called report I have requested that they have the MRI Pelvis from yesterday read ASAP also.  IMPRESSION: 1. New uptake within the T12 and L1 vertebral bodies since prior bone scan from 2022, this corresponds to wedge compression deformity and mild vertebral sclerosis on recent CT and is concerning for metastatic disease. 2. New intense activity at the left greater than right sacral ala, possible insufficiency fractures for question on CT performed today, suggest correlation with MRI.   These results will be called to the ordering clinician or representative by the Radiologist Assistant, and communication documented in the PACS or [employer].     Electronically Signed   By: Donavan Foil M.D.   On: 06/06/2022 00:02

## 2022-06-10 ENCOUNTER — Inpatient Hospital Stay (HOSPITAL_BASED_OUTPATIENT_CLINIC_OR_DEPARTMENT_OTHER): Payer: Medicare Other | Admitting: Oncology

## 2022-06-10 DIAGNOSIS — C61 Malignant neoplasm of prostate: Secondary | ICD-10-CM

## 2022-06-14 ENCOUNTER — Encounter: Payer: Self-pay | Admitting: Oncology

## 2022-06-14 NOTE — Progress Notes (Signed)
I connected with Cesar Powell on 06/14/22 at  1:00 PM EST by video enabled telemedicine visit and verified that I am speaking with the correct person using two identifiers.   I discussed the limitations, risks, security and privacy concerns of performing an evaluation and management service by telemedicine and the availability of in-person appointments. I also discussed with the patient that there may be a patient responsible charge related to this service. The patient expressed understanding and agreed to proceed.  Other persons participating in the visit and their role in the encounter:  none  Patient's location:  home Provider's location:  work  Risk analyst Complaint:  discuss mri results and further management  History of present illness: patient is a 88 year old gentleman with no significant past medical problems.  He has a history of prostate cancer that was diagnosed and treated with radiation in 2005.  At that point he his PSA was about 0.03 for a long time.  Then started on Axiron for low testosterone.  PSA eventually went up to as high as 18 2015.  At that point he was started on Lupron his PSA doubling time is around 10 months. Most recently since October 2018 after his PSA went down from 3.6-1.7 and has been gradually increasing to 1.3,1.5 and 2 indicating a PSA doubling time of 9.9 months.     patient  seen by me in September 2019 discussed adding oral antiandrogens like apalutamide versus enzalutamide to Lupron in castrate resistant nonmetastatic prostate cancer.  After discussing risks and benefits patient did not wish to proceed.Patient stopped lupron in feb 2020 but did restart after 1 year in February 2021.  Patient received IMRT for his pelvic lymph nodes.  Patient was started on relugolix in June 2023.  Patient stopped taking it in August 2023 due to problems with sleeping   Patient has had significant anemia with a hemoglobin that has fluctuated between 8-9 since August 2022.  Prior  to that his hemoglobin was stable between 11-12.Results of anemia work-up in the past have been as follows: Ferritin levels elevated in the 400s.  TIBC low at 225 with an iron saturation of 12%.  ANA comprehensive panel negative.  TSH and folate normal.  LDH normal.  Haptoglobin elevated at 427.  B12 levels normal.  Patient had a bone marrow biopsy given his significant anemia which shows slightly hypercellular marrow with myeloid hyperplasia but no evidence of leukemia lymphoma or high-grade dysplasia.  Erythroid precursors were decreased in numbers with occasional atypia.  Granulocytic precursors no overt dysplasia and megakaryocytes quantitatively and qualitatively unremarkable    Interval history patient has baseline fatigue.  Denies any fever with chills or burning urination.  He has chronic urinary incontinence   Review of Systems  Constitutional:  Positive for malaise/fatigue. Negative for chills, fever and weight loss.  HENT:  Negative for congestion, ear discharge and nosebleeds.   Eyes:  Negative for blurred vision.  Respiratory:  Negative for cough, hemoptysis, sputum production, shortness of breath and wheezing.   Cardiovascular:  Negative for chest pain, palpitations, orthopnea and claudication.  Gastrointestinal:  Negative for abdominal pain, blood in stool, constipation, diarrhea, heartburn, melena, nausea and vomiting.  Genitourinary:  Negative for dysuria, flank pain, frequency, hematuria and urgency.       Incontinence  Musculoskeletal:  Negative for back pain, joint pain and myalgias.  Skin:  Negative for rash.  Neurological:  Negative for dizziness, tingling, focal weakness, seizures, weakness and headaches.  Endo/Heme/Allergies:  Does not bruise/bleed easily.  Psychiatric/Behavioral:  Negative for depression and suicidal ideas. The patient does not have insomnia.     No Known Allergies  Past Medical History:  Diagnosis Date   Coronary artery disease involving native  coronary artery of native heart 08/25/2021   H/O prostate cancer    was treated with radiation   Hypertension    Local recurrence of prostate cancer ([place]) 06/21/2004   Qualifier: Diagnosis of  By: Fuller Plan CMA (AAMA), [place]     Myocardial infarction ([place]) 2022   Obstructive sleep apnea 06/22/2007   NPSG [place] 1979 Unattended Home sleep Study- 05/10/14- Confirms severe OSA, AHI 41.8/ hr, weight 230 pounds CPAP and also Transcend portable CPAP auto 8-15     Sleep apnea 1977   uses C-pap     Past Surgical History:  Procedure Laterality Date   ABDOMINAL SURGERY     ruptured intestines    CATARACT EXTRACTION W/PHACO Left 01/29/2016   Procedure: CATARACT EXTRACTION PHACO AND INTRAOCULAR LENS PLACEMENT ([place]) left eye;  Surgeon: Leandrew Koyanagi, MD;  Location: [place];  Service: Ophthalmology;  Laterality: Left;  RESTOR LENS   CATARACT EXTRACTION W/PHACO Right 10/21/2016   Procedure: CATARACT EXTRACTION PHACO AND INTRAOCULAR LENS PLACEMENT ([facility])  Right restor toric lens;  Surgeon: Leandrew Koyanagi, MD;  Location: [place];  Service: Ophthalmology;  Laterality: Right;  restor toric lens   CORONARY/GRAFT ACUTE MI REVASCULARIZATION N/A 05/13/2021   Procedure: Coronary/Graft Acute MI Revascularization;  Surgeon: Nelva Bush, MD;  Location: [place] [facility];  Service: Cardiovascular;  Laterality: N/A;   CYSTOSCOPY WITH LITHOLAPAXY N/A 08/14/2016   Procedure: CYSTOSCOPY WITH LITHOLAPAXY;  Surgeon: Nickie Retort, MD;  Location: [facility];  Service: Urology;  Laterality: N/A;   FEMUR IM NAIL Right 10/08/2014   Procedure: INTRAMEDULLARY (IM) NAIL FEMORAL;  Surgeon: Rod Can, MD;  Location: [place];  Service: Orthopedics;  Laterality: Right;  PER DANIELLE 110 MIN   HOLMIUM LASER APPLICATION  8/34/1962   Procedure: HOLMIUM LASER APPLICATION;  Surgeon: Nickie Retort, MD;  Location: [facility];  Service: Urology;;   LEFT HEART CATH AND CORONARY ANGIOGRAPHY N/A  05/13/2021   Procedure: LEFT HEART CATH AND CORONARY ANGIOGRAPHY;  Surgeon: Nelva Bush, MD;  Location: [place] [facility];  Service: Cardiovascular;  Laterality: N/A;   OTHER SURGICAL HISTORY  2006   Prostate Radiation Surgery    RADIOACTIVE SEED IMPLANT N/A 10/30/2019   Procedure: RADIOACTIVE SEED IMPLANT/BRACHYTHERAPY IMPLANT;  Surgeon: Billey Co, MD;  Location: [facility];  Service: Urology;  Laterality: N/A;  74 seeds implanted    Social History   Socioeconomic History   Marital status: Married    Spouse name: Not on file   Number of children: Not on file   Years of education: Not on file   Highest education level: Not on file  Occupational History   Occupation: retired  Tobacco Use   Smoking status: Former    Packs/day: 1.00    Years: 15.00    Total pack years: 15.00    Types: Cigarettes    Quit date: 09/14/1965    Years since quitting: 56.7    Passive exposure: Past   Smokeless tobacco: Never  Vaping Use   Vaping Use: Never used  Substance and Sexual Activity   Alcohol use: Yes    Alcohol/week: 7.0 standard drinks of alcohol    Types: 7 Glasses of wine per week   Drug use: No   Sexual activity: Yes    Birth control/protection:  None  Other Topics Concern   Not on file  Social History Narrative   Not on file   Social Determinants of Health   Financial Resource Strain: Low Risk  (12/05/2021)   Overall Financial Resource Strain (CARDIA)    Difficulty of Paying Living Expenses: Not hard at all  Food Insecurity: No Food Insecurity (03/19/2022)   Hunger Vital Sign    Worried About Running Out of Food in the Last Year: Never true    Ran Out of Food in the Last Year: Never true  Transportation Needs: No Transportation Needs (03/19/2022)   PRAPARE - Hydrologist (Medical): No    Lack of Transportation (Non-Medical): No  Physical Activity: Insufficiently Active (12/05/2021)   Exercise Vital Sign    Days of Exercise per Week: 5 days     Minutes of Exercise per Session: 10 min  Stress: No Stress Concern Present (12/05/2021)   [place]    Feeling of Stress : Not at all  Social Connections: Moderately Isolated (12/05/2021)   Social Connection and Isolation Panel [NHANES]    Frequency of Communication with Friends and Family: More than three times a week    Frequency of Social Gatherings with Friends and Family: More than three times a week    Attends Religious Services: Never    Marine scientist or Organizations: No    Attends Archivist Meetings: Never    Marital Status: Married  Human resources officer Violence: Not At Risk (12/05/2021)   Humiliation, Afraid, Rape, and Kick questionnaire    Fear of Current or Ex-Partner: No    Emotionally Abused: No    Physically Abused: No    Sexually Abused: No    Family History  Problem Relation Age of Onset   Heart disease Brother    Heart attack Brother    Lung cancer Brother    Prostate cancer Neg Hx    Bladder Cancer Neg Hx    Kidney cancer Neg Hx      Current Outpatient Medications:    atorvastatin (LIPITOR) 80 MG tablet, Take 1 tablet (80 mg total) by mouth daily., Disp: 90 tablet, Rfl: 3   losartan (COZAAR) 25 MG tablet, Take 1 tablet (25 mg total) by mouth daily., Disp: 30 tablet, Rfl: 0   nitroGLYCERIN (NITROSTAT) 0.4 MG SL tablet, Place 0.4 mg under the tongue every 5 (five) minutes as needed for chest pain. (Patient not taking: Reported on 04/03/2022), Disp: , Rfl:    tamsulosin (FLOMAX) 0.4 MG CAPS capsule, Take 0.4 mg by mouth daily after supper., Disp: , Rfl:   MR PELVIS W WO CONTRAST  Result Date: 06/08/2022 CLINICAL DATA:  Prostate cancer, fluid collection status post prostatectomy with concern for osteomyelitis, additional suspected sacral insufficiency fractures by nuclear bone scan EXAM: MRI PELVIS WITHOUT AND WITH CONTRAST TECHNIQUE: Multiplanar multisequence MR imaging of the pelvis  was performed both before and after administration of intravenous contrast. CONTRAST:  7.30m GADAVIST GADOBUTROL 1 MMOL/ML IV SOLN COMPARISON:  CT chest abdomen pelvis, 06/03/2022, nuclear scintigraphic whole body bone scan, 06/03/2022 FINDINGS: Urinary Tract: Severe thickening of the decompressed urinary bladder (series 18, image 36). Bowel:  Sigmoid diverticula. Vascular/Lymphatic: No pathologically enlarged lymph nodes. No significant vascular abnormality seen. Reproductive: Heterogeneous, rim enhancing fluid collection in the expected vicinity of the prostate measuring 5.6 x 3.1 x 4.6 cm (series 21, image 39, series 22, image 21). No normal prostate tissue appreciated.  Extensive adjacent fat stranding. Other:  None. Musculoskeletal: Cortical destruction of the pubic symphysis with marrow edema and enhancement anteriorly abutting the fluid collection described above (series 21, image 41). Edematous, nondisplaced bilateral sacral insufficiency fractures, left-greater-than-right (series 15, image 18). Status post right hip intramedullary nail fixation. IMPRESSION: 1. Heterogeneous, rim enhancing fluid collection in the expected vicinity of the prostate measuring 5.6 x 3.1 x 4.6 cm communicating with the bladder neck and penile urethra. No normal prostate tissue appreciated. Extensive adjacent fat stranding. Per review of clinical notes, patient has not undergone prostatectomy, and this is presumed to reflect some combination of locally recurrent prostate cancer, necrosis, and infection. The presence or absence of infection however is not specifically established by imaging. 2. Cortical destruction of the pubic symphysis with marrow edema and enhancement anteriorly abutting the fluid collection described above, concerning for direct involvement of prostate cancer as well as osteomyelitis. 3. Edematous, nondisplaced bilateral sacral insufficiency fractures, left-greater-than-right, as seen by prior nuclear  scintigraphic bone scan and which may be pathologic secondary to local radiation therapy. 4. Severe thickening of the decompressed urinary bladder, presumed to reflect a combination of infectious or inflammatory cystitis as well as chronic outlet obstruction. Electronically Signed   By: Delanna Ahmadi M.D.   On: 06/08/2022 10:31   NM Bone Scan Whole Body  Result Date: 06/06/2022 CLINICAL DATA:  Prostate cancer EXAM: NUCLEAR MEDICINE WHOLE BODY BONE SCAN TECHNIQUE: Whole body anterior and posterior images were obtained approximately 3 hours after intravenous injection of radiopharmaceutical. RADIOPHARMACEUTICALS:  21.04 mCi Technetium-68mMDP IV COMPARISON:  CT 06/03/2022, 02/20/2022, PET CT 11/25/2021, bone scan 04/08/2021, 07/08/2020 FINDINGS: Physiologic uptake within the kidneys. Mild suspected degenerative activity at the shoulders. Focal activity involving the T12 and L1 vertebral, corresponding to wedge compression deformities with mild vertebral sclerosis. Intense activity at the left greater than right sacral ala. Mild nonspecific diffuse bilateral rib activity. IMPRESSION: 1. New uptake within the T12 and L1 vertebral bodies since prior bone scan from 2022, this corresponds to wedge compression deformity and mild vertebral sclerosis on recent CT and is concerning for metastatic disease. 2. New intense activity at the left greater than right sacral ala, possible insufficiency fractures for question on CT performed today, suggest correlation with MRI. These results will be called to the ordering clinician or representative by the Radiologist Assistant, and communication documented in the PACS or [employer] Electronically Signed   By: KDonavan FoilM.D.   On: 06/06/2022 00:02   CT CHEST ABDOMEN PELVIS W CONTRAST  Result Date: 06/04/2022 CLINICAL DATA:  Follow-up prostate cancer; * Tracking Code: BO * EXAM: CT CHEST, ABDOMEN, AND PELVIS WITH CONTRAST TECHNIQUE: Multidetector CT imaging of the  chest, abdomen and pelvis was performed following the standard protocol during bolus administration of intravenous contrast. RADIATION DOSE REDUCTION: This exam was performed according to the departmental dose-optimization program which includes automated exposure control, adjustment of the mA and/or kV according to patient size and/or use of iterative reconstruction technique. CONTRAST:  1030mOMNIPAQUE IOHEXOL 300 MG/ML  SOLN COMPARISON:  Multiple priors, most recent CT abdomen and pelvis dated February 20, 2022 FINDINGS: CT CHEST FINDINGS Cardiovascular: Normal heart size. No pericardial effusion. Mitral annular calcifications. Severe left main and three-vessel coronary artery calcifications. Normal caliber thoracic aorta with severe atherosclerotic disease. Mediastinum/Nodes: Esophagus and thyroid are unremarkable. No pathologically enlarged lymph nodes seen in the chest. Lungs/Pleura: Central airways are patent. No consolidation, pleural effusion or pneumothorax. Musculoskeletal: No chest wall mass or suspicious bone lesions  identified. CT ABDOMEN PELVIS FINDINGS Hepatobiliary: No focal liver abnormality is seen. No gallstones, gallbladder wall thickening, or biliary dilatation. Pancreas: Unremarkable. No pancreatic ductal dilatation or surrounding inflammatory changes. Spleen: Normal in size without focal abnormality. Adrenals/Urinary Tract: Bilateral adrenal glands are unremarkable. No hydronephrosis or nephrolithiasis. Bilateral low-attenuation renal lesions, largest are compatible with simple cysts, others are too small to completely characterize. Bladder is decompressed and markedly thick-walled perivesicular fat stranding. Stomach/Bowel: Rectal wall thickening and perirectal fat stranding, no fat plane is seen between the rectum and prostate. Diverticulosis. Normal appearing appendix. No evidence of obstruction. Vascular/Lymphatic: Enlarged right retrocrural and retroperitoneal lymph nodes, unchanged when  compared with the prior exam. Reference right pericaval lymph node measuring 1.5 cm on series 2, image 74. Mesenteric calcification located on series 5, image 31, unchanged when compared with April 02, 2021 prior, likely treated metastatic disease Reproductive: An area of dystrophic calcification located medial to the right pubic symphysis is decreased size when compared with prior exam. Fluid collection at the area of the prostate measuring approximately 4.0 x 2.4 cm on series 2 image 114 containing small locules of air and tracking anteriorly to the pubic symphysis. Additionally there is widening and osseous erosions pubic symphysis. Other: Small fat containing ventral abdominal wall hernia. Musculoskeletal: Irregularity and widening at the pubic symphysis, increased when compared with prior exams. Unchanged sclerotic wedge deformity of L1 and T12 sclerosis. New subtle cortical irregularity of the bilateral sacral ala, concerning for sacral insufficiency fractures. Similar sacral lucency which is likely due to demineralization. IMPRESSION: 1. Fluid collection at the area of the prostate containing small locules of air tracking anteriorly to the pubic symphysis with associated widening and irregularity of the pubic symphysis, findings are concerning for progressive necrotic prostate tumor versus prostate abscess and osteomyelitis. Pelvic MRI with contrast is recommended further evaluation. 2. Rectal wall thickening and perirectal fat stranding, concerning for secondary involvement. 3. Bladder is decompressed and markedly thick-walled with perivesicular fat stranding, findings are compatible with cystitis. 4. Enlarged retroperitoneal lymph nodes, unchanged when compared with prior exam, and consistent with metastatic disease. 5. New subtle cortical irregularity of the bilateral sacral ala, concerning for sacral insufficiency fractures. 6. Aortic Atherosclerosis (ICD10-I70.0). These results will be called to the  ordering clinician or representative by the Radiologist Assistant, and communication documented in the PACS or [employer]. Electronically Signed   By: Yetta Glassman M.D.   On: 06/04/2022 18:38    No images are attached to the encounter.      Latest Ref Rng & Units 06/05/2022   10:12 AM  CMP  Glucose 70 - 99 mg/dL 99   BUN 8 - 23 mg/dL 22   Creatinine 0.61 - 1.24 mg/dL 1.17   Sodium 135 - 145 mmol/L 136   Potassium 3.5 - 5.1 mmol/L 5.0   Chloride 98 - 111 mmol/L 103   CO2 22 - 32 mmol/L 26   Calcium 8.9 - 10.3 mg/dL 9.1   Total Protein 6.5 - 8.1 g/dL 6.8   Total Bilirubin 0.3 - 1.2 mg/dL 0.6   Alkaline Phos 38 - 126 U/L 58   AST 15 - 41 U/L 22   ALT 0 - 44 U/L 12       Latest Ref Rng & Units 06/05/2022   10:12 AM  CBC  WBC 4.0 - 10.5 K/uL 5.5   Hemoglobin 13.0 - 17.0 g/dL 9.3   Hematocrit 39.0 - 52.0 % 29.6   Platelets 150 - 400 K/uL 274  Observation/objective: Appears in no acute distress over video visit today.  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  PSA surprisingly has remained stable and fluctuates between 18-25.  Recent scans did not show disease progression and mainly showed unchanged enlarged retroperitoneal lymph nodes.  There was however fluid collection noted around the prostate which is why the MRI was obtained.  MRI is again not able to decipher if this is necrotic tumor versus recurrent disease versus prostatic abscess and osteomyelitis.  From a clinical standpoint patient does not report any symptoms of pelvic pain fevers or burning urination.  I am therefore not prescribing any antibiotics  for him.  Patient understands that if his symptoms worsen he needs to get in touch with me or urology.  Follow-up instructions: I will continue to follow-up with his PSA every 3 months and see him thereafter with labs  I discussed the assessment and treatment plan with the patient. The patient was provided an opportunity to ask questions and all were answered. The patient agreed with the plan and demonstrated an understanding of the instructions.   The patient was advised to call back or seek an in-person evaluation if the symptoms worsen or if the condition fails to improve as anticipated.  I provided 14 minutes of face-to-face video visit time during this encounter, and > 50% was spent counseling as documented under my assessment & plan.  Visit Diagnosis: 1. Prostate cancer [facility])     Dr. Randa Evens, MD, MPH [facility] at [facility] Tel- [phone] 06/14/2022 2:27 PM

## 2022-06-16 ENCOUNTER — Telehealth: Payer: Self-pay | Admitting: *Deleted

## 2022-06-16 ENCOUNTER — Encounter: Payer: Self-pay | Admitting: Oncology

## 2022-06-16 NOTE — Telephone Encounter (Signed)
Patient called reporting that he went to see a chiropractor who wants to do laser therapy to his lower back but wanted patient to check to see if the heat would cause a problem with his prostate cancer. Please advise

## 2022-06-16 NOTE — Telephone Encounter (Signed)
Call returned to patient and informed of doctor response 

## 2022-06-16 NOTE — Telephone Encounter (Signed)
Heat will not affect his prostate cancer

## 2022-06-16 NOTE — Telephone Encounter (Signed)
Called the pt back and told him that heat will not hurt his prostate so it is ok for heat for the laser therapy

## 2022-06-19 ENCOUNTER — Telehealth: Payer: Self-pay | Admitting: Family Medicine

## 2022-06-19 DIAGNOSIS — I469 Cardiac arrest, cause unspecified: Secondary | ICD-10-CM | POA: Diagnosis not present

## 2022-06-19 NOTE — Telephone Encounter (Signed)
error 

## 2022-06-19 NOTE — Telephone Encounter (Signed)
Of course - I am not sure if this needs a response?, but I will, of course, complete the death certificate.

## 2022-06-19 NOTE — Telephone Encounter (Signed)
Patient daughter [name] and son called to inform Dr. Lorelei Pont that Mr. Cesar Powell had passed away last night. They stated they need him to sign the death certificate. He will be taking to [place] and [facility] in [place].

## 2022-06-19 NOTE — Telephone Encounter (Signed)
Pt's daughter, [name], call asking about getting [place] to sign death certificate. Confirmed with Butch Penny about the funeral home having to upload the certificate to a website so [facility] could sign It. Kim requested for certificate to be signed by Tuesday, 06/23/22 so the cremation can occur before Thanksgiving. Call back # [phone]

## 2022-06-21 NOTE — Telephone Encounter (Signed)
Will complete 

## 2022-07-03 DIAGNOSIS — 419620001 Death: Secondary | SNOMED CT | POA: Diagnosis not present

## 2022-07-03 DEATH — deceased

## 2022-08-26 ENCOUNTER — Ambulatory Visit: Payer: Medicare Other | Admitting: Urology

## 2022-09-10 ENCOUNTER — Ambulatory Visit: Payer: Medicare Other | Admitting: Urology

## 2023-01-15 ENCOUNTER — Ambulatory Visit: Payer: Medicare Other | Admitting: Radiation Oncology

## 2023-05-14 IMAGING — CT CT HEAD W/O CM
3 series · 15 of 47 positions shown, 18 images · non-contrast
Comparison: None.

CLINICAL DATA: Syncope, simple, normal neuro exam

EXAM:
CT HEAD WITHOUT CONTRAST
TECHNIQUE: Contiguous axial images were obtained from the base of the skull
through the vertex without intravenous contrast.

[Series 3: head wo · axial · 0.44mm/px · z∈[-151,-11]mm · 9 of 34 slices shown, 12 images]
[im 3/34  brain]
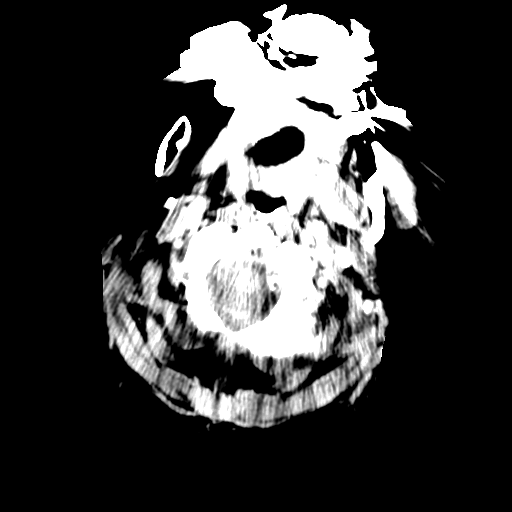
[im 3/34  bone]
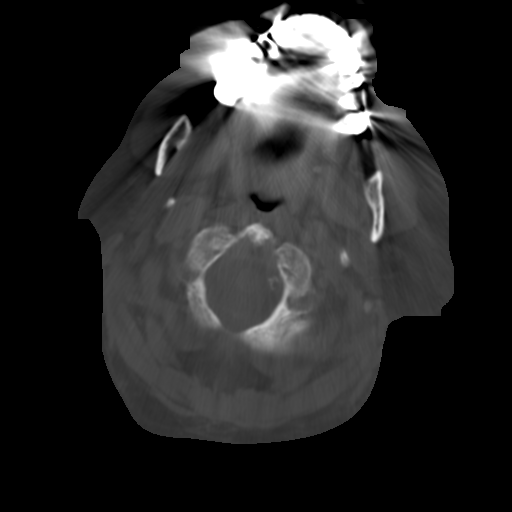
[im 6/34  brain]
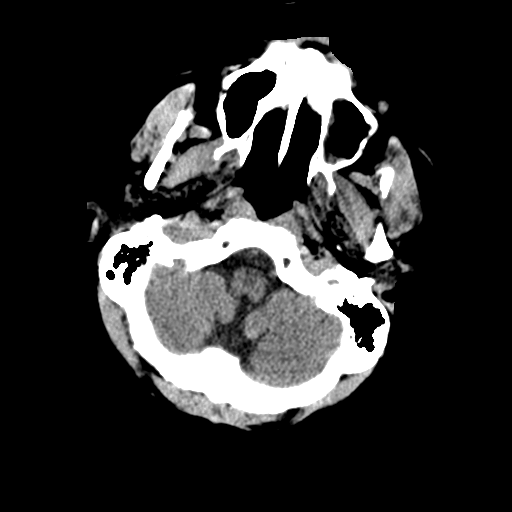
[im 10/34  brain]
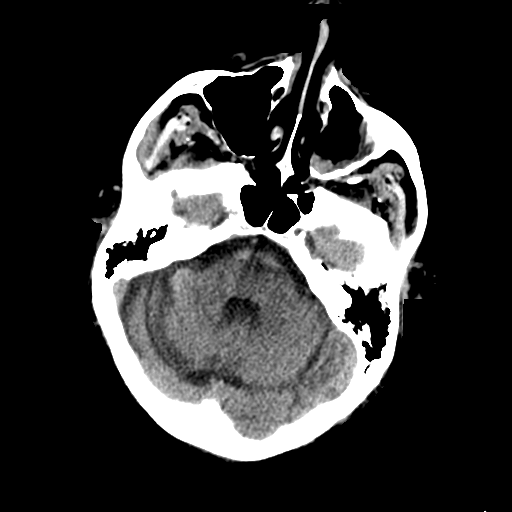
[im 13/34  brain]
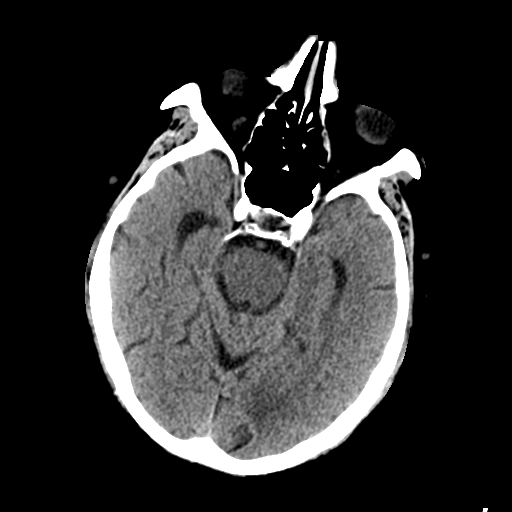
[im 18/34  brain]
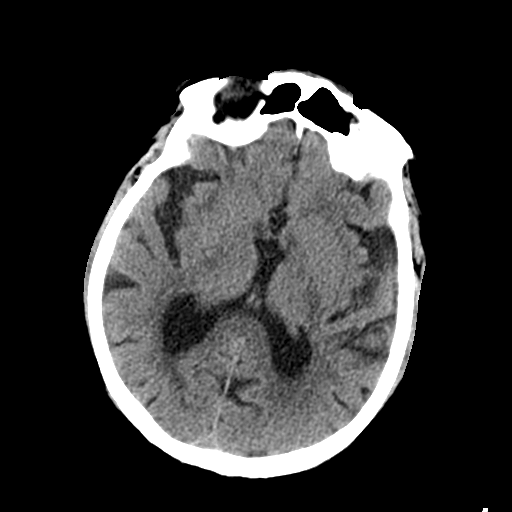
[im 18/34  bone]
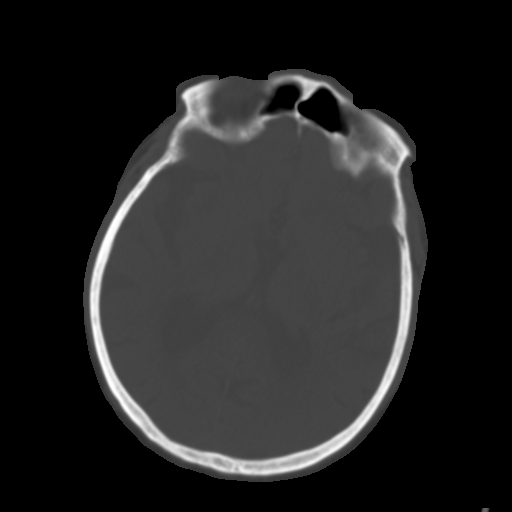
[im 21/34  brain]
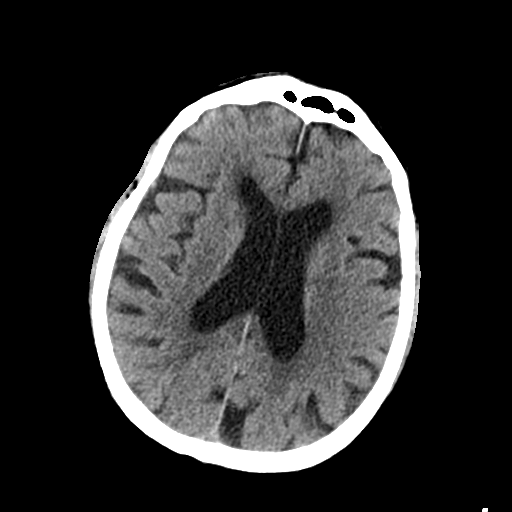
[im 24/34  brain]
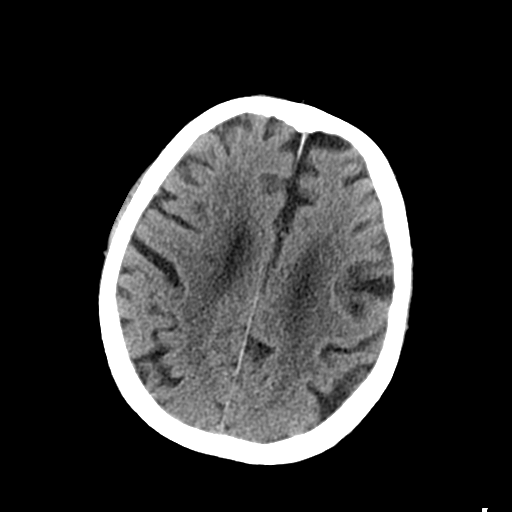
[im 28/34  brain]
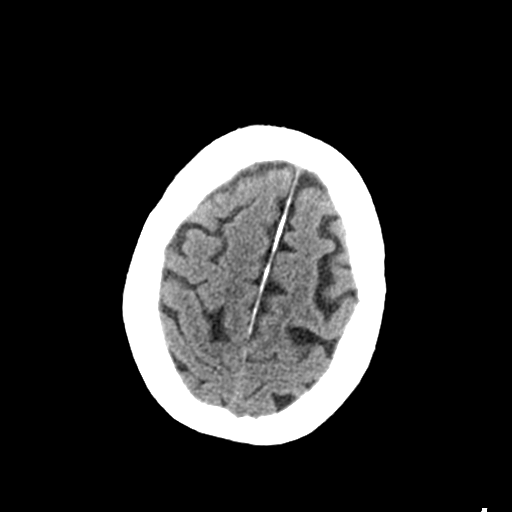
[im 31/34  brain]
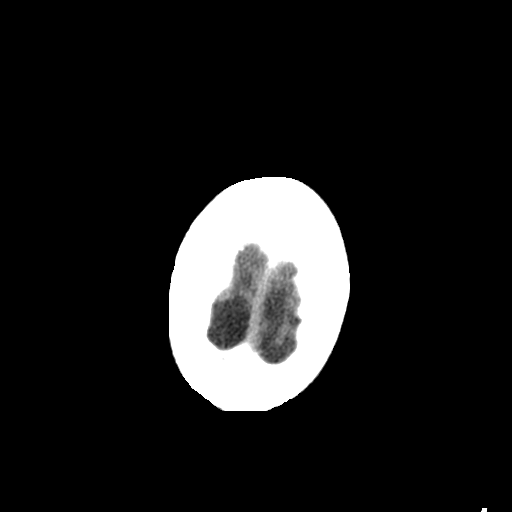
[im 31/34  bone]
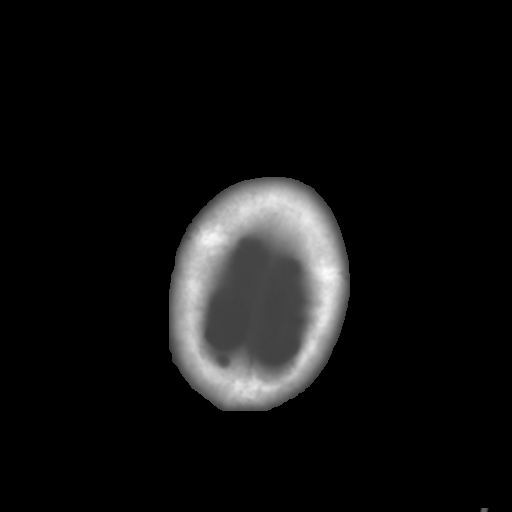

[Series 4: coronal soft tissue · coronal · 0.35mm/px · 3 of 75 slices shown]
[im 25/75  brain]
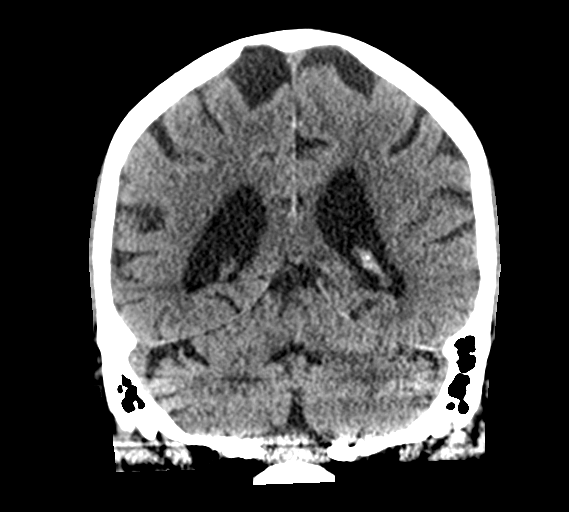
[im 33/75  brain]
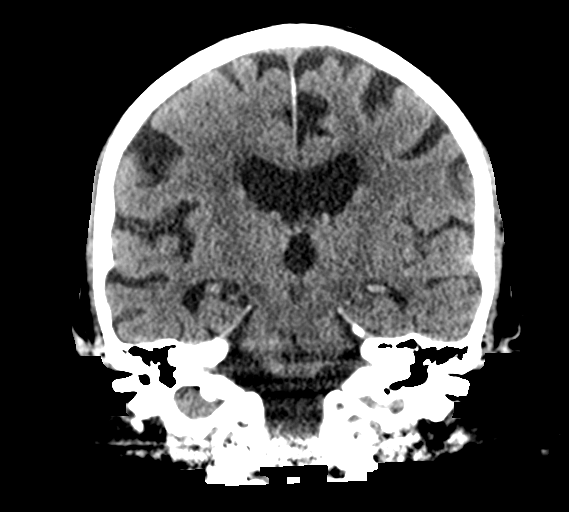
[im 42/75  brain]
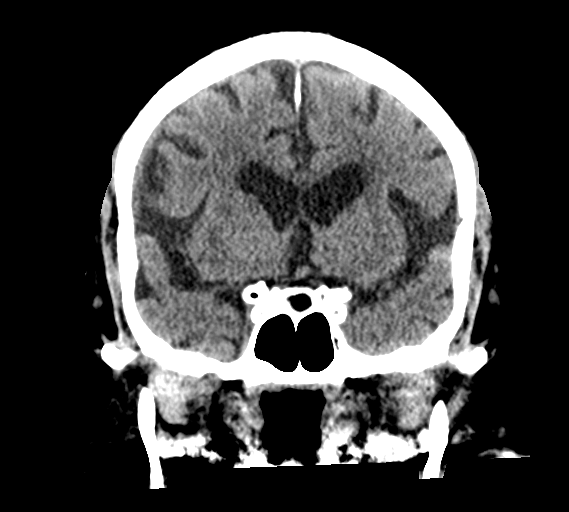

[Series 5: sagittal soft tissue · sagittal · 0.35mm/px · 3 of 57 slices shown]
[im 19/57  brain]
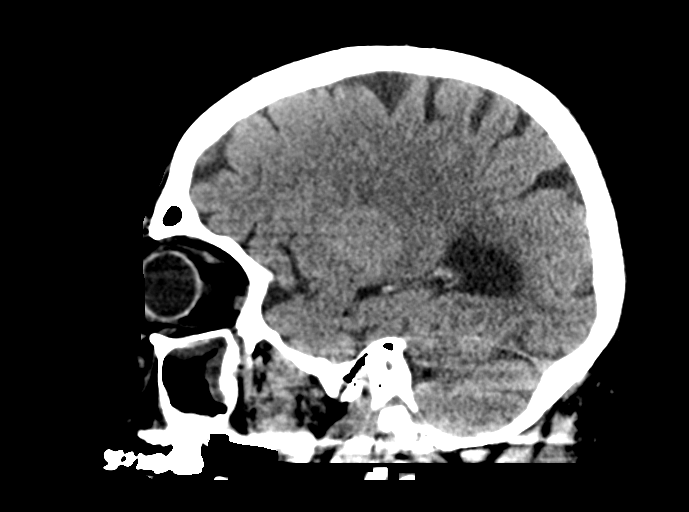
[im 29/57  brain]
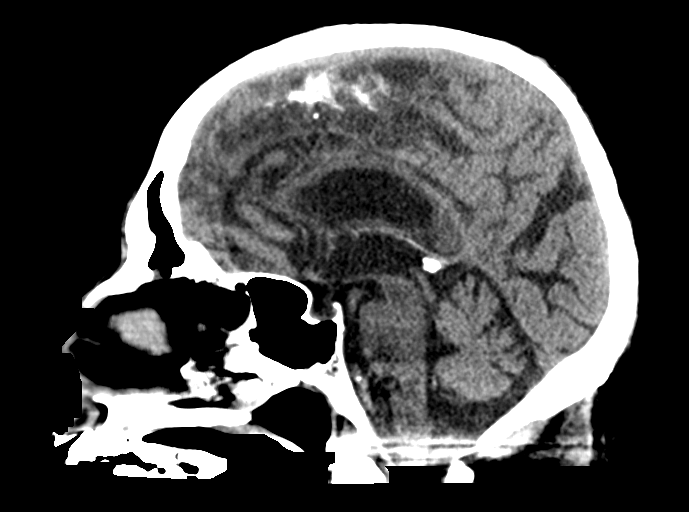
[im 38/57  brain]
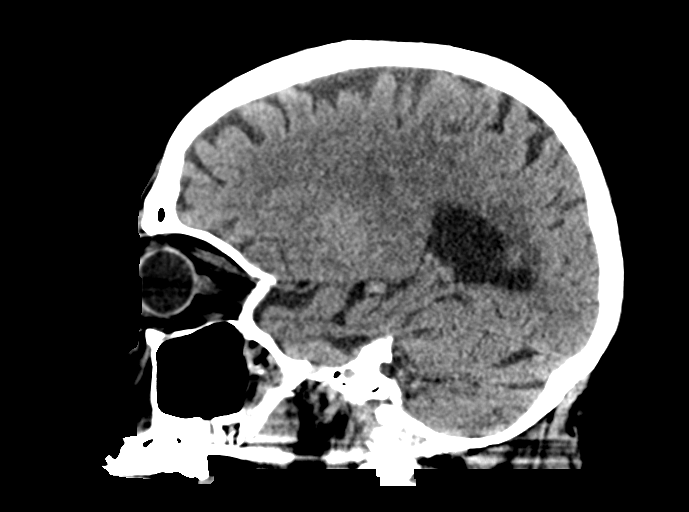

[15 of 47 positions shown; findings below may reference images not displayed]

FINDINGS: Brain: There is no acute intracranial hemorrhage, mass effect, or
edema. Gray-white differentiation is preserved. There is no
extra-axial fluid collection. Patchy low-attenuation in the
supratentorial white matter is nonspecific but probably reflects
mild to moderate chronic microvascular ischemic changes. There is an
age-indeterminate small vessel infarct of the left corona radiata.
Prominence of the ventricles and sulci reflects parenchymal volume
loss.

Vascular: No hyperdense vessel.There is atherosclerotic
calcification at the skull base.

Skull: Calvarium is unremarkable.

Sinuses/Orbits: Lobular ethmoid and left maxillary sinus mucosal
thickening. Bilateral lens replacements.

Other: Mastoid air cells are clear.  Bilateral hearing aids.
IMPRESSION: No acute intracranial abnormality.

Chronic microvascular ischemic changes. Age-indeterminate but
probably chronic small vessel infarct of the left corona radiata.
